# Patient Record
Sex: Female | Born: 1940
Health system: Southern US, Community
[De-identification: ages and names within clinical notes are randomized; demographics above are authoritative.]

## PROBLEM LIST (undated history)

## (undated) DIAGNOSIS — K219 Gastro-esophageal reflux disease without esophagitis: Secondary | ICD-10-CM

## (undated) DIAGNOSIS — Z800 Family history of malignant neoplasm of digestive organs: Secondary | ICD-10-CM

## (undated) DIAGNOSIS — Z4689 Encounter for fitting and adjustment of other specified devices: Secondary | ICD-10-CM

## (undated) DIAGNOSIS — I639 Cerebral infarction, unspecified: Secondary | ICD-10-CM

## (undated) DIAGNOSIS — C569 Malignant neoplasm of unspecified ovary: Secondary | ICD-10-CM

## (undated) DIAGNOSIS — M549 Dorsalgia, unspecified: Secondary | ICD-10-CM

## (undated) DIAGNOSIS — I10 Essential (primary) hypertension: Secondary | ICD-10-CM

## (undated) HISTORY — DX: Family history of malignant neoplasm of digestive organs: Z80.0

## (undated) HISTORY — DX: Encounter for fitting and adjustment of other specified devices: Z46.89

## (undated) HISTORY — DX: Cerebral infarction, unspecified: I63.9

## (undated) HISTORY — DX: Essential (primary) hypertension: I10

## (undated) HISTORY — PX: FRACTURE SURGERY: SHX138

## (undated) HISTORY — PX: ABDOMINAL HYSTERECTOMY: SHX81

## (undated) HISTORY — PX: BACK SURGERY: SHX140

## (undated) MED FILL — Dexamethasone Sodium Phosphate Inj 100 MG/10ML: INTRAMUSCULAR | Qty: 1 | Status: AC

---

## 2011-10-25 ENCOUNTER — Emergency Department (HOSPITAL_COMMUNITY)
Admission: EM | Admit: 2011-10-25 | Discharge: 2011-10-25 | Disposition: A | Discharge status: Home / Self Care | Payer: Medicare Other | Attending: Emergency Medicine | Admitting: Emergency Medicine

## 2011-10-25 ENCOUNTER — Encounter (HOSPITAL_COMMUNITY): Payer: Self-pay | Admitting: *Deleted

## 2011-10-25 DIAGNOSIS — Z7982 Long term (current) use of aspirin: Secondary | ICD-10-CM | POA: Insufficient documentation

## 2011-10-25 DIAGNOSIS — Z79899 Other long term (current) drug therapy: Secondary | ICD-10-CM | POA: Insufficient documentation

## 2011-10-25 DIAGNOSIS — M545 Low back pain, unspecified: Secondary | ICD-10-CM | POA: Insufficient documentation

## 2011-10-25 DIAGNOSIS — M549 Dorsalgia, unspecified: Secondary | ICD-10-CM

## 2011-10-25 DIAGNOSIS — K219 Gastro-esophageal reflux disease without esophagitis: Secondary | ICD-10-CM | POA: Insufficient documentation

## 2011-10-25 HISTORY — DX: Dorsalgia, unspecified: M54.9

## 2011-10-25 HISTORY — DX: Gastro-esophageal reflux disease without esophagitis: K21.9

## 2011-10-25 MED ORDER — DIAZEPAM 5 MG PO TABS: 12.0000 mg | ORAL_TABLET | Freq: Three times a day (TID) | ORAL | Status: AC | PRN

## 2011-10-25 MED ORDER — OXYCODONE-ACETAMINOPHEN 5-325 MG PO TABS: 1.0000 | ORAL_TABLET | ORAL | Status: AC | PRN

## 2011-10-25 MED ORDER — HYDROMORPHONE HCL PF 1 MG/ML IJ SOLN
1.0000 mg | Freq: Once | INTRAMUSCULAR | Status: AC
  Administered 2011-10-25: 1 mg via INTRAMUSCULAR
  Filled 2011-10-25: qty 1

## 2011-10-25 MED ORDER — DIAZEPAM 5 MG/ML IJ SOLN
5.0000 mg | Freq: Once | INTRAMUSCULAR | Status: AC
  Administered 2011-10-25: 5 mg via INTRAMUSCULAR
  Filled 2011-10-25: qty 2

## 2011-10-25 MED ORDER — NAPROXEN 500 MG PO TABS: 500.0000 mg | ORAL_TABLET | Freq: Two times a day (BID) | ORAL | Status: DC | PRN

## 2011-10-25 MED ORDER — IBUPROFEN 200 MG PO TABS
400.0000 mg | ORAL_TABLET | Freq: Once | ORAL | Status: AC
  Administered 2011-10-25: 400 mg via ORAL
  Filled 2011-10-25: qty 2

## 2011-10-25 NOTE — ED Notes (Signed)
Pt in from home with lower back pain denies recent injury hx of back surgery with ruptured disc pt states pain is shooting into the legs

## 2011-10-25 NOTE — ED Notes (Signed)
Pt from home with reports of lower back pain, worse on left that started on Monday. Pt also reports doing more yard work and work around than normal due to husband having surgery. Pt also endorses hx of chronic back pain with back surgery as well as pain injections. Pt denies pain radiation, dysuria and N/V/D.

## 2011-10-25 NOTE — ED Provider Notes (Signed)
History    70yF with lower back pain. Gradual onset Monday. Doing yard work day prior. Diffuse across the lower back. Does not lateralize. Does not radiate. Ache at rest and worse with movement. She denies numbness, tingling or loss of strength. She denies use of blood thinning medication aside from 81 mg of aspirin. Patient has a history of a back pain with previous back surgery and also injections. Denies any recent procedures. No fevers or chills. No urinary complaints. No rash. No change in sensation when she wipes after using the bathroom.  CSN: [id number]  Arrival date & time 10/25/11  4098   First MD Initiated Contact with Patient 10/25/11 1018      Chief Complaint  Patient presents with  . Back Pain    (Consider location/radiation/quality/duration/timing/severity/associated sxs/prior treatment) HPI  Past Medical History  Diagnosis Date  . GERD (gastroesophageal reflux disease)   . Back pain     Past Surgical History  Procedure Date  . Back surgery   . Abdominal hysterectomy   . Fracture surgery     History reviewed. No pertinent family history.  History  Substance Use Topics  . Smoking status: Never Smoker   . Smokeless tobacco: Never Used  . Alcohol Use: No    OB History    Grav Para Term Preterm Abortions TAB SAB Ect Mult Living                  Review of Systems   Review of symptoms negative unless otherwise noted in HPI.   Allergies  Review of patient's allergies indicates no known allergies.  Home Medications   Current Outpatient Rx  Name Route Sig Dispense Refill  . ASPIRIN EC 81 MG PO TBEC Oral Take 81 mg by mouth daily.    Marland Kitchen CALCIUM CARBONATE-VITAMIN D 500-200 MG-UNIT PO TABS Oral Take 1 tablet by mouth daily.    Marland Kitchen CETIRIZINE HCL 10 MG PO TABS Oral Take 10 mg by mouth daily.    Marland Kitchen ESTRADIOL 1 MG PO TABS Oral Take 1 mg by mouth daily.    . ADULT MULTIVITAMIN W/MINERALS CH Oral Take 1 tablet by mouth daily.    . OXYCODONE-ACETAMINOPHEN 5-325  MG PO TABS Oral Take 1 tablet by mouth every 4 (four) hours as needed. For pain    . RANITIDINE HCL 150 MG PO TABS Oral Take 150 mg by mouth daily.    . CYANOCOBALAMIN 250 MCG PO TABS Oral Take 250 mcg by mouth daily.    Marland Kitchen VITAMIN C 500 MG PO TABS Oral Take 500 mg by mouth daily.      BP 148/74  Pulse 69  Temp(Src) 98.3 F (36.8 C) (Oral)  Resp 16  Ht 5\' 7"  (1.702 m)  Wt 200 lb (90.719 kg)  BMI 31.32 kg/m2  SpO2 100%  Physical Exam  Nursing note and vitals reviewed. Constitutional: She appears well-developed and well-nourished. No distress.  HENT:  Head: Normocephalic and atraumatic.  Eyes: Conjunctivae are normal. Right eye exhibits no discharge. Left eye exhibits no discharge.  Neck: Neck supple.  Cardiovascular: Normal rate, regular rhythm and normal heart sounds.  Exam reveals no gallop and no friction rub.   No murmur heard. Pulmonary/Chest: Effort normal and breath sounds normal. No respiratory distress.  Abdominal: Soft. She exhibits no distension. There is no tenderness.  Musculoskeletal: She exhibits no edema.       Well-healed midline surgical scar in the lumbar region. Overlying skin changes. No midline spinal tenderness. Mild  tenderness bilaterally paraspinally.  Neurological: She is alert.       Strength is 5 out of 5 bilateral lower extremities. Patellar reflexes 1+. Incision is intact to light touch. Slightly antalgic gait.  Skin: Skin is warm and dry.  Psychiatric: She has a normal mood and affect. Her behavior is normal. Thought content normal.    ED Course  Procedures (including critical care time)  Labs Reviewed - No data to display No results found.   1. Back pain       MDM  Female with lower back pain. Suspect muscular sprain/strain with onset shortly after yard work. Gradual onset. There is no history of acute trauma. Pt denies hx of CA to suggest possible mets or pathologic fracture. Patient is afebrile. Denies hx of IV drug use. Doubt spinal  epidural abscess or vertebral osteomyelitis/discitis. There is no clinical evidence of spinal cord impingement. She has no acute neurological complaints and has a nonfocal neurological examination. No urinary symptoms to suggest UTI. Doubt ureteral colic. Pt does have a remote hx of back surgery, but no recent procedures.  Take 81mg , otherwise not anticoagulated. Consider spinal epidural hematoma but doubt. Doubt AAA or dissection. No overlying skin changes to suggest shingles and pain crosses midline. Pt requesting an MRI. There is no indication to have this done emergently. Plan symptomatic tx. Return precautions discussed. Outpt fu.        Raeford Razor, MD 10/27/11 [phone]

## 2011-10-25 NOTE — Discharge Instructions (Signed)
Back Pain, Adult Low back pain is very common. About 1 in 5 people have back pain.The cause of low back pain is rarely dangerous. The pain often gets better over time.About half of people with a sudden onset of back pain feel better in just 2 weeks. About 8 in 10 people feel better by 6 weeks.  CAUSES Some common causes of back pain include:  Strain of the muscles or ligaments supporting the spine.   Wear and tear (degeneration) of the spinal discs.   Arthritis.   Direct injury to the back.  DIAGNOSIS Most of the time, the direct cause of low back pain is not known.However, back pain can be treated effectively even when the exact cause of the pain is unknown.Answering your caregiver's questions about your overall health and symptoms is one of the most accurate ways to make sure the cause of your pain is not dangerous. If your caregiver needs more information, he or she may order lab work or imaging tests (X-rays or MRIs).However, even if imaging tests show changes in your back, this usually does not require surgery. HOME CARE INSTRUCTIONS For many people, back pain returns.Since low back pain is rarely dangerous, it is often a condition that people can learn to manageon their own.   Remain active. It is stressful on the back to sit or stand in one place. Do not sit, drive, or stand in one place for more than 30 minutes at a time. Take short walks on level surfaces as soon as pain allows.Try to increase the length of time you walk each day.   Do not stay in bed.Resting more than 1 or 2 days can delay your recovery.   Do not avoid exercise or work.Your body is made to move.It is not dangerous to be active, even though your back may hurt.Your back will likely heal faster if you return to being active before your pain is gone.   Pay attention to your body when you bend and lift. Many people have less discomfortwhen lifting if they bend their knees, keep the load close to their  bodies,and avoid twisting. Often, the most comfortable positions are those that put less stress on your recovering back.   Find a comfortable position to sleep. Use a firm mattress and lie on your side with your knees slightly bent. If you lie on your back, put a pillow under your knees.   Only take over-the-counter or prescription medicines as directed by your caregiver. Over-the-counter medicines to reduce pain and inflammation are often the most helpful.Your caregiver may prescribe muscle relaxant drugs.These medicines help dull your pain so you can more quickly return to your normal activities and healthy exercise.   Put ice on the injured area.   Put ice in a plastic bag.   Place a towel between your skin and the bag.   Leave the ice on for 15 to 20 minutes, 3 to 4 times a day for the first 2 to 3 days. After that, ice and heat may be alternated to reduce pain and spasms.   Ask your caregiver about trying back exercises and gentle massage. This may be of some benefit.   Avoid feeling anxious or stressed.Stress increases muscle tension and can worsen back pain.It is important to recognize when you are anxious or stressed and learn ways to manage it.Exercise is a great option.  SEEK MEDICAL CARE IF:  You have pain that is not relieved with rest or medicine.   You have   pain that does not improve in 1 week.   You have new symptoms.   You are generally not feeling well.  SEEK IMMEDIATE MEDICAL CARE IF:   You have pain that radiates from your back into your legs.   You develop new bowel or bladder control problems.   You have unusual weakness or numbness in your arms or legs.   You develop nausea or vomiting.   You develop abdominal pain.   You feel faint.  Document Released: 06/15/2005 Document Revised: 06/04/2011 Document Reviewed: 11/03/2010 [facility] Patient Information 2012 [place], [place].  RESOURCE GUIDE  Dental Problems  Patients with Medicaid: [facility] [phone] [address]                                           [phone] [employer] [address] Phone:  [phone]                                                  Phone:  [phone]  If unable to pay or uninsured, contact:  [facility] or [facility]. to become qualified for the adult dental clinic.  Chronic Pain Problems Contact [facility]  [phone] Patients need to be referred by their primary care doctor.  Insufficient Money for Medicine Contact [employer]:  call "211" or [facility] [phone].  No Primary Care Doctor Call [facility]  [phone] Other agencies that provide inexpensive medical care    [facility]  [phone]    [facility]  [phone]    [facility]  [phone]    [facility]  [phone]    [facility]  [phone]    [facility]  [phone]  Psychological [facility]  [phone] [facility]  [phone] [facility] Mental Health   [phone] (emergency services [phone])  Substance Abuse Resources [facility]  [phone] [facility] 808-019-2490 [facility] [phone] Floydene Flock [phone] Residential & Outpatient Substance Abuse Program  310-832-0651  Abuse/Neglect [facility] Child Abuse Hotline 939-733-4751 [facility] Child Abuse Hotline 216-448-2372 (After Hours)  Emergency Shelter [facility] 7694117232  Maternity [facility] 7166953731 [facility] [phone]  MRSA Hotline #:   (475)316-5257    [facility] Resources  [facility] [place]     [employer]                          [facility] Dept. [address] [place]                       [address]      [address]  [address]  Cristobal Goldmann Phone:  [phone]                                   Phone:  [phone]                 Phone:  [phone]  [facility] Mental [facility] Phone:  [phone]  [facility] Child Abuse Hotline (336)857-2547 480-714-8526 (After Hours)  Back Exercises Back exercises help treat and prevent back injuries. The goal of back exercises is to increase the strength of your abdominal and back muscles and the flexibility of your back. These exercises should be started when you no longer have back pain. Back exercises include:  Pelvic Tilt. Lie on your back with your knees bent. Tilt your pelvis until the lower part of your back is against the floor. Hold this position 5 to 10 sec and repeat 5 to 10 times.   Knee to Chest. Pull first 1 knee up against your chest and hold for 20 to 30 seconds, repeat this with the other knee, and then both knees. This may be done with the other leg straight or bent, whichever feels better.   Sit-Ups or Curl-Ups. Bend your knees 90 degrees. Start with tilting your pelvis, and do a partial, slow sit-up, lifting your trunk only 30 to 45 degrees off the floor. Take at least 2 to 3 seconds for each sit-up. Do not do sit-ups with your knees out straight. If partial sit-ups are difficult, simply do the above but with only tightening your abdominal muscles and holding it as directed.   Hip-Lift. Lie on your back with your knees flexed 90 degrees. Push down with your feet and shoulders as you raise your hips a couple inches off the floor; hold for 10 seconds, repeat 5 to 10 times.   Back arches. Lie on your stomach, propping yourself up on bent elbows. Slowly press on your hands, causing an arch in your low back. Repeat 3 to 5 times. Any initial stiffness and discomfort should lessen with repetition over time.   Shoulder-Lifts. Lie face down with arms beside your body. Keep hips and torso pressed to floor as you slowly lift your  head and shoulders off the floor.  Do not overdo your exercises, especially in the beginning. Exercises may cause you some mild back discomfort which lasts for a few minutes; however, if the pain is more severe, or lasts for more than 15 minutes, do not continue exercises until you see your caregiver. Improvement with exercise therapy for back problems is slow.  See your caregivers for assistance with developing a proper back exercise program. Document Released: 07/23/2004 Document Revised: 06/04/2011 Document Reviewed: 06/15/2005 [facility] Patient Information 2012 [place], [place].

## 2012-05-28 ENCOUNTER — Encounter (HOSPITAL_COMMUNITY): Payer: Self-pay | Admitting: *Deleted

## 2012-05-28 ENCOUNTER — Emergency Department (HOSPITAL_COMMUNITY)
Admission: EM | Admit: 2012-05-28 | Discharge: 2012-05-28 | Disposition: A | Discharge status: Home / Self Care | Payer: Medicare Other | Attending: Emergency Medicine | Admitting: Emergency Medicine

## 2012-05-28 DIAGNOSIS — Z7982 Long term (current) use of aspirin: Secondary | ICD-10-CM | POA: Insufficient documentation

## 2012-05-28 DIAGNOSIS — R110 Nausea: Secondary | ICD-10-CM | POA: Insufficient documentation

## 2012-05-28 DIAGNOSIS — F411 Generalized anxiety disorder: Secondary | ICD-10-CM | POA: Insufficient documentation

## 2012-05-28 DIAGNOSIS — G43909 Migraine, unspecified, not intractable, without status migrainosus: Secondary | ICD-10-CM | POA: Insufficient documentation

## 2012-05-28 DIAGNOSIS — R51 Headache: Secondary | ICD-10-CM

## 2012-05-28 DIAGNOSIS — K219 Gastro-esophageal reflux disease without esophagitis: Secondary | ICD-10-CM | POA: Insufficient documentation

## 2012-05-28 DIAGNOSIS — Z79899 Other long term (current) drug therapy: Secondary | ICD-10-CM | POA: Insufficient documentation

## 2012-05-28 DIAGNOSIS — H53149 Visual discomfort, unspecified: Secondary | ICD-10-CM | POA: Insufficient documentation

## 2012-05-28 MED ORDER — DIPHENHYDRAMINE HCL 50 MG/ML IJ SOLN
25.0000 mg | Freq: Once | INTRAMUSCULAR | Status: AC
  Administered 2012-05-28: 25 mg via INTRAMUSCULAR
  Filled 2012-05-28: qty 1

## 2012-05-28 MED ORDER — HYDROCODONE-ACETAMINOPHEN 5-500 MG PO TABS: 1.0000 | ORAL_TABLET | Freq: Four times a day (QID) | ORAL | Status: DC | PRN

## 2012-05-28 MED ORDER — METOCLOPRAMIDE HCL 5 MG/ML IJ SOLN
10.0000 mg | Freq: Once | INTRAMUSCULAR | Status: AC
  Administered 2012-05-28: 10 mg via INTRAMUSCULAR
  Filled 2012-05-28: qty 2

## 2012-05-28 MED ORDER — KETOROLAC TROMETHAMINE 60 MG/2ML IM SOLN
60.0000 mg | Freq: Once | INTRAMUSCULAR | Status: AC
  Administered 2012-05-28: 60 mg via INTRAMUSCULAR
  Filled 2012-05-28: qty 2

## 2012-05-28 NOTE — ED Provider Notes (Signed)
History     CSN: [id number]  Arrival date & time 05/28/12  1512   First MD Initiated Contact with Patient 05/28/12 1946      Chief Complaint  Patient presents with  . Headache    (Consider location/radiation/quality/duration/timing/severity/associated sxs/prior treatment) Patient is a 71 y.o. female presenting with headaches. The history is provided by the patient, the spouse and medical records.  Headache  Pertinent negatives include no fever, no shortness of breath, no nausea and no vomiting.   Paula Peterson is a 71 y.o. female presents to the emergency department complaining of a headache gradual onset approximately 10:30 AM this morning, it has been persistent and gradually worsening. She states she's been seen several times in the last 2 weeks the emergency department in [facility] and by her family practice physician for these headaches. 2 weeks ago in the emergency department and he and she was given a CT scan, cardiac workup blood work and EKG all of which were normal. She was diagnosed by her primary care doctor is having migraines. He suggested that she begin taking over-the-counter medications for her migraines and prescribed a Xanax prescription for her anxiety. She states the headache is associated with photophobia and occasionally with nausea but not with vomiting. She states the pain was not sudden onset without changes in her vision.  She denies weakness, dizziness,  Numbness, tingling,  Confusion, your, chills, neck pain, chest pain, shortness of breath, abdominal pain, vomiting, diarrhea, constipation, syncope.  Her family practice physician has made her an appointment with [facility] neurology and she is to see them on Wednesday. She states when she was in the emergency department less than 1 week ago they gave her a shot which made her feel better.  Past Medical History  Diagnosis Date  . GERD (gastroesophageal reflux disease)   . Back pain     Past Surgical History  Procedure  Date  . Back surgery   . Abdominal hysterectomy   . Fracture surgery     History reviewed. No pertinent family history.  History  Substance Use Topics  . Smoking status: Never Smoker   . Smokeless tobacco: Never Used  . Alcohol Use: No    OB History    Grav Para Term Preterm Abortions TAB SAB Ect Mult Living                  Review of Systems  Constitutional: Negative for fever, diaphoresis, appetite change, fatigue and unexpected weight change.  HENT: Negative for mouth sores and neck stiffness.   Eyes: Positive for photophobia. Negative for visual disturbance.  Respiratory: Negative for cough, chest tightness, shortness of breath and wheezing.   Cardiovascular: Negative for chest pain.  Gastrointestinal: Negative for nausea, vomiting, abdominal pain, diarrhea and constipation.  Genitourinary: Negative for dysuria, urgency, frequency and hematuria.  Skin: Negative for rash.  Neurological: Positive for headaches. Negative for syncope and light-headedness.  Psychiatric/Behavioral: Negative for sleep disturbance. The patient is not nervous/anxious.   All other systems reviewed and are negative.    Allergies  Review of patient's allergies indicates no known allergies.  Home Medications   Current Outpatient Rx  Name  Route  Sig  Dispense  Refill  . ALPRAZOLAM 0.25 MG PO TABS   Oral   Take 0.25 mg by mouth 2 (two) times daily as needed. For anxiety         . ASPIRIN EC 81 MG PO TBEC   Oral   Take 81 mg by  mouth daily.         Marland Kitchen CALCIUM CARBONATE-VITAMIN D 500-200 MG-UNIT PO TABS   Oral   Take 1 tablet by mouth daily.         Marland Kitchen CETIRIZINE HCL 10 MG PO TABS   Oral   Take 10 mg by mouth daily.         Marland Kitchen ESTRADIOL 1 MG PO TABS   Oral   Take 1 mg by mouth daily.         . IBUPROFEN 200 MG PO CAPS   Oral   Take 200 mg by mouth daily as needed. For migraines         . ADULT MULTIVITAMIN W/MINERALS CH   Oral   Take 1 tablet by mouth daily.          Marland Kitchen NAPROXEN 500 MG PO TABS   Oral   Take 500 mg by mouth 2 (two) times daily as needed. For pain         . NORTRIPTYLINE HCL 25 MG PO CAPS   Oral   Take 50 mg by mouth at bedtime.         . OMEPRAZOLE 20 MG PO CPDR   Oral   Take 20 mg by mouth daily.         Marland Kitchen RANITIDINE HCL 150 MG PO TABS   Oral   Take 150 mg by mouth daily.         . CYANOCOBALAMIN 250 MCG PO TABS   Oral   Take 250 mcg by mouth daily.         Marland Kitchen VITAMIN C 500 MG PO TABS   Oral   Take 500 mg by mouth daily.         Marland Kitchen HYDROCODONE-ACETAMINOPHEN 5-500 MG PO TABS   Oral   Take 1 tablet by mouth every 6 (six) hours as needed for pain.   5 tablet   0     BP 158/78  Pulse 72  Temp 97.8 F (36.6 C) (Oral)  Resp 19  SpO2 100%  Physical Exam  Nursing note and vitals reviewed. Constitutional: She appears well-developed and well-nourished. No distress.  HENT:  Head: Normocephalic and atraumatic.  Right Ear: Tympanic membrane, external ear and ear canal normal.  Left Ear: Tympanic membrane, external ear and ear canal normal.  Nose: Nose normal. Right sinus exhibits no maxillary sinus tenderness and no frontal sinus tenderness. Left sinus exhibits no maxillary sinus tenderness and no frontal sinus tenderness.  Mouth/Throat: Uvula is midline, oropharynx is clear and moist and mucous membranes are normal. No oropharyngeal exudate.  Eyes: Conjunctivae normal and EOM are normal. Pupils are equal, round, and reactive to light. No scleral icterus.  Neck: Normal range of motion. Neck supple.  Cardiovascular: Normal rate, regular rhythm, normal heart sounds and intact distal pulses.  Exam reveals no gallop and no friction rub.   No murmur heard. Pulmonary/Chest: Effort normal and breath sounds normal. No respiratory distress. She has no wheezes.  Abdominal: Soft. Bowel sounds are normal. She exhibits no mass. There is no tenderness. There is no rebound and no guarding.  Musculoskeletal: Normal range of  motion. She exhibits no edema.  Lymphadenopathy:    She has no cervical adenopathy.  Neurological: She is alert. No cranial nerve deficit. She exhibits normal muscle tone. Coordination normal.       Speech is clear and goal oriented, follows commands Major Cranial nerves without deficit, no facial droop Normal strength in  upper and lower extremities bilaterally including dorsiflexion and plantar flexion, strong and equal grip strength Sensation normal to light and sharp touch Moves extremities without ataxia, coordination intact Normal finger to nose and rapid alternating movements Neg modified romberg, no pronator drift Normal gait and balance   Skin: Skin is warm and dry. No rash noted. She is not diaphoretic. No erythema.  Psychiatric: She has a normal mood and affect.    ED Course  Procedures (including critical care time)  Labs Reviewed - No data to display No results found.   1. Migraine   2. Headache       MDM  Taegan Haider Pichon presents with headache.  Pt HA treated with Toradol, Benadryl and Reglanand improved while in ED.  Presentation is like the headaches that she has had in the last few weeks and non concerning for [facility], ICH, Meningitis, or temporal arteritis. Pt is afebrile with no focal neuro deficits, nuchal rigidity, or change in vision. Pt is to follow up with per neurology next week as currently scheduled discuss prophylactic medication. Pt verbalizes understanding and is agreeable with plan to dc.    1. Medications: [place], usual home medications  2. Treatment: rest, drink plenty of fluids, take medication as prescribed  3. Follow Up: Please followup with your primary doctor for discussion of your diagnoses and further evaluation after today's visit; if you do not have a primary care doctor use the resource guide provided to find one; follow-up with [facility] this week as scheduled       Dierdre Forth, PA-C 05/28/12 2133

## 2012-05-28 NOTE — ED Notes (Signed)
Pt reports having severe headache x 2 weeks. Has been seen at hospital in [place] and dx with migraine, no relief from pain. Denies any n/v. No acute distress noted at triage.

## 2012-05-29 NOTE — ED Provider Notes (Signed)
Medical screening examination/treatment/procedure(s) were performed by non-physician practitioner and as supervising physician I was immediately available for consultation/collaboration.   Carleene Cooper III, MD 05/29/12 1254

## 2013-04-08 ENCOUNTER — Emergency Department (HOSPITAL_COMMUNITY)
Admission: EM | Admit: 2013-04-08 | Discharge: 2013-04-08 | Disposition: A | Discharge status: Home / Self Care | Payer: Medicare Other | Attending: Emergency Medicine | Admitting: Emergency Medicine

## 2013-04-08 ENCOUNTER — Emergency Department (HOSPITAL_COMMUNITY): Payer: Medicare Other

## 2013-04-08 ENCOUNTER — Encounter (HOSPITAL_COMMUNITY): Payer: Self-pay | Admitting: Emergency Medicine

## 2013-04-08 DIAGNOSIS — K219 Gastro-esophageal reflux disease without esophagitis: Secondary | ICD-10-CM | POA: Insufficient documentation

## 2013-04-08 DIAGNOSIS — J320 Chronic maxillary sinusitis: Secondary | ICD-10-CM | POA: Insufficient documentation

## 2013-04-08 DIAGNOSIS — Z7982 Long term (current) use of aspirin: Secondary | ICD-10-CM | POA: Insufficient documentation

## 2013-04-08 DIAGNOSIS — Z79899 Other long term (current) drug therapy: Secondary | ICD-10-CM | POA: Insufficient documentation

## 2013-04-08 DIAGNOSIS — R202 Paresthesia of skin: Secondary | ICD-10-CM

## 2013-04-08 DIAGNOSIS — R209 Unspecified disturbances of skin sensation: Secondary | ICD-10-CM | POA: Insufficient documentation

## 2013-04-08 DIAGNOSIS — I10 Essential (primary) hypertension: Secondary | ICD-10-CM | POA: Insufficient documentation

## 2013-04-08 DIAGNOSIS — J0100 Acute maxillary sinusitis, unspecified: Secondary | ICD-10-CM

## 2013-04-08 LAB — CBC
Hemoglobin: 13.7 g/dL (ref 12.0–15.0)
MCH: 30.9 pg (ref 26.0–34.0)
MCV: 91.2 fL (ref 78.0–100.0)
Platelets: 285 10*3/uL (ref 150–400)
RBC: 4.43 MIL/uL (ref 3.87–5.11)
WBC: 6.1 10*3/uL (ref 4.0–10.5)

## 2013-04-08 LAB — COMPREHENSIVE METABOLIC PANEL
ALT: 13 U/L (ref 0–35)
AST: 22 U/L (ref 0–37)
Albumin: 3.4 g/dL — ABNORMAL LOW (ref 3.5–5.2)
BUN: 13 mg/dL (ref 6–23)
CO2: 25 mEq/L (ref 19–32)
Calcium: 9.5 mg/dL (ref 8.4–10.5)
Chloride: 100 mEq/L (ref 96–112)
Creatinine, Ser: 0.8 mg/dL (ref 0.50–1.10)
GFR calc Af Amer: 85 mL/min — ABNORMAL LOW (ref >90)
Glucose, Bld: 88 mg/dL (ref 70–99)
Sodium: 136 mEq/L (ref 135–145)
Total Protein: 7 g/dL (ref 6.0–8.3)

## 2013-04-08 LAB — DIFFERENTIAL
Basophils Absolute: 0 10*3/uL (ref 0.0–0.1)
Basophils Relative: 1 % (ref 0–1)
Eosinophils Relative: 2 % (ref 0–5)
Lymphocytes Relative: 49 % — ABNORMAL HIGH (ref 12–46)
Lymphs Abs: 3 10*3/uL (ref 0.7–4.0)
Neutro Abs: 2.4 10*3/uL (ref 1.7–7.7)

## 2013-04-08 LAB — RAPID URINE DRUG SCREEN, HOSP PERFORMED
Amphetamines: NOT DETECTED
Barbiturates: NOT DETECTED
Benzodiazepines: NOT DETECTED
Cocaine: NOT DETECTED
Tetrahydrocannabinol: NOT DETECTED

## 2013-04-08 LAB — URINALYSIS, ROUTINE W REFLEX MICROSCOPIC
Bilirubin Urine: NEGATIVE
Ketones, ur: NEGATIVE mg/dL
Leukocytes, UA: NEGATIVE
Nitrite: NEGATIVE
Protein, ur: NEGATIVE mg/dL
Urobilinogen, UA: 0.2 mg/dL (ref 0.0–1.0)

## 2013-04-08 LAB — PROTIME-INR: Prothrombin Time: 12.5 seconds (ref 11.6–15.2)

## 2013-04-08 LAB — TROPONIN I: Troponin I: <0.3 ng/mL (ref <0.30)

## 2013-04-08 NOTE — ED Notes (Signed)
Pt. Was seen at [facility] with c/o face tingling and left hand tingling.  Pt. Was noted HTN at the [facility].  Pt. reports no other complaints at this time.  Pt. reports that the tingling in face has gone away and only has tingling in the hand.

## 2013-04-08 NOTE — ED Provider Notes (Signed)
CSN: [id number]     Arrival date & time 04/08/13  1851 History   First MD Initiated Contact with Patient 04/08/13 1854     Chief Complaint  Patient presents with  . Hypertension  . Tingling   (Consider location/radiation/quality/duration/timing/severity/associated sxs/prior Treatment) HPI Comments: Paula Peterson is a 72 y.o. female who complains of tingling in her about 4:30 PM today. The facial tingling has resolved. She did not feel weak or have difficulty walking or using her left hand. She's had intermittent headaches for one month. They occur spontaneously, and resolve spontaneously. No recent fever, chills, nausea, vomiting, cough, shortness of breath, chest pain, back, pain or gait disorder. She was seen briefly in urgent care and advised to come to the emergency room. She was sent by EMS. She was not treated during that transport. There are no other known modifying factors.   Patient is a 72 y.o. female presenting with hypertension. The history is provided by the patient and the spouse.  Hypertension    Past Medical History  Diagnosis Date  . GERD (gastroesophageal reflux disease)   . Back pain    Past Surgical History  Procedure Laterality Date  . Back surgery    . Abdominal hysterectomy    . Fracture surgery     History reviewed. No pertinent family history. History  Substance Use Topics  . Smoking status: Never Smoker   . Smokeless tobacco: Never Used  . Alcohol Use: No   OB History   Grav Para Term Preterm Abortions TAB SAB Ect Mult Living                 Review of Systems  All other systems reviewed and are negative.    Allergies  Review of patient's allergies indicates no known allergies.  Home Medications   Current Outpatient Rx  Name  Route  Sig  Dispense  Refill  . aspirin EC 81 MG tablet   Oral   Take 81 mg by mouth daily.         . calcium-vitamin D (OSCAL WITH D) 500-200 MG-UNIT per tablet   Oral   Take 1 tablet by mouth daily.          . cetirizine (ZYRTEC) 10 MG tablet   Oral   Take 10 mg by mouth daily.         Marland Kitchen estradiol (ESTRACE) 1 MG tablet   Oral   Take 1 mg by mouth daily.         Marland Kitchen MAGNESIUM PO   Oral   Take 1 tablet by mouth daily.         . Multiple Vitamin (MULITIVITAMIN WITH MINERALS) TABS   Oral   Take 1 tablet by mouth daily.         Marland Kitchen omeprazole (PRILOSEC) 20 MG capsule   Oral   Take 20 mg by mouth daily.         . vitamin B-12 (CYANOCOBALAMIN) 250 MCG tablet   Oral   Take 250 mcg by mouth daily.         . vitamin C (ASCORBIC ACID) 500 MG tablet   Oral   Take 500 mg by mouth daily.          BP 128/69  Pulse 68  Temp(Src) 98.4 F (36.9 C) (Oral)  Resp 20  SpO2 97% Physical Exam  Nursing note and vitals reviewed. Constitutional: She is oriented to person, place, and time. She appears well-developed and well-nourished.  HENT:  Head: Normocephalic and atraumatic.  Eyes: Conjunctivae and EOM are normal. Pupils are equal, round, and reactive to light.  Neck: Normal range of motion and phonation normal. Neck supple.  Cardiovascular: Normal rate, regular rhythm and intact distal pulses.   Pulmonary/Chest: Effort normal and breath sounds normal. She exhibits no tenderness.  Abdominal: Soft. She exhibits no distension. There is no tenderness. There is no guarding.  Musculoskeletal: Normal range of motion.  Neurological: She is alert and oriented to person, place, and time. She exhibits normal muscle tone.  No aphasia, no dysarthria, no ataxia. No dysmetria.  Skin: Skin is warm and dry.  Psychiatric: She has a normal mood and affect. Her behavior is normal. Judgment and thought content normal.    ED Course  Procedures (including critical care time)  Medications - No data to display  Patient Vitals for the past 24 hrs:  BP Temp Temp src Pulse Resp SpO2  04/08/13 2330 128/69 mmHg - - 68 - 97 %  04/08/13 2315 117/91 mmHg - - 68 - 98 %  04/08/13 2234 150/69 mmHg - - 69  20 98 %  04/08/13 2136 122/74 mmHg - - - 13 98 %  04/08/13 2115 164/74 mmHg - - 72 20 98 %  04/08/13 2045 168/91 mmHg - - 69 15 99 %  04/08/13 2015 156/74 mmHg - - 68 16 100 %  04/08/13 1903 174/95 mmHg 98.4 F (36.9 C) Oral 70 20 100 %  04/08/13 1854 - 98.3 F (36.8 C) - - - -    20:10- paresthesias with improvement spontaneously. She has not meet criteria for treatment as a code stroke.    Labs Review Labs Reviewed  DIFFERENTIAL - Abnormal; Notable for the following:    Neutrophils Relative % 39 (*)    Lymphocytes Relative 49 (*)    All other components within normal limits  COMPREHENSIVE METABOLIC PANEL - Abnormal; Notable for the following:    Albumin 3.4 (*)    Total Bilirubin 0.2 (*)    GFR calc non Af Amer 74 (*)    GFR calc Af Amer 85 (*)    All other components within normal limits  ETHANOL  PROTIME-INR  APTT  CBC  URINE RAPID DRUG SCREEN (HOSP PERFORMED)  URINALYSIS, ROUTINE W REFLEX MICROSCOPIC  TROPONIN I  GLUCOSE, CAPILLARY   Imaging Review Ct Head Wo Contrast  04/08/2013   CLINICAL DATA:  Left arm and left mouth tingling sensations.  EXAM: CT HEAD WITHOUT CONTRAST  TECHNIQUE: Contiguous axial images were obtained from the base of the skull through the vertex without intravenous contrast.  COMPARISON:  Brain MR dated 12/02/2011 and paranasal sinus CT dated 01/21/2010.  FINDINGS: Mildly enlarged subarachnoid spaces. Normal size and position of the ventricles. No intracranial hemorrhage, mass lesion or CT evidence of acute infarction. Right maxillary sinus air-fluid level. Unremarkable bones.  IMPRESSION: 1. No acute intracranial abnormality. 2. Mild diffuse cortical atrophy. 3. Acute right maxillary sinusitis.   Electronically Signed   By: Gordan Payment M.D.   On: 04/08/2013 21:57    EKG Interpretation   None        Date: 04/08/13  Rate: 70  Rhythm: normal sinus rhythm  QRS Axis: normal  PR and QT Intervals: PR prolonged  ST/T Wave abnormalities:  normal  PR and QRS Conduction Disutrbances:none  Narrative Interpretation:   Old EKG Reviewed: none available   MDM   1. Paresthesia of left arm   2. Maxillary sinusitis, acute  Nonspecific paresthesias. Doubt TIA, CVA, metabolic instability, or cervical myelopathy. She has improved spontaneously, in stable for discharge. She has incidental finding of maxillary sinusitis that may be allergy related. She has a prescription for Nasonex that she can start taking, at home.  Nursing Notes Reviewed/ Care Coordinated, and agree without changes. Applicable Imaging Reviewed.  Interpretation of Laboratory Data incorporated into ED treatment   Plan: Home Medications- usual; Home Treatments and Observation- rest, return for worsening symptoms; return here if the recommended treatment, does not improve the symptoms; Recommended follow up- PCP, for check up in 3 days.    Flint Melter, MD 04/09/13 [phone]

## 2014-07-05 DIAGNOSIS — M961 Postlaminectomy syndrome, not elsewhere classified: Secondary | ICD-10-CM | POA: Diagnosis not present

## 2014-07-05 DIAGNOSIS — M4807 Spinal stenosis, lumbosacral region: Secondary | ICD-10-CM | POA: Diagnosis not present

## 2014-07-05 DIAGNOSIS — M5136 Other intervertebral disc degeneration, lumbar region: Secondary | ICD-10-CM | POA: Diagnosis not present

## 2014-08-01 DIAGNOSIS — M4807 Spinal stenosis, lumbosacral region: Secondary | ICD-10-CM | POA: Diagnosis not present

## 2014-08-13 DIAGNOSIS — R05 Cough: Secondary | ICD-10-CM | POA: Diagnosis not present

## 2014-08-13 DIAGNOSIS — J019 Acute sinusitis, unspecified: Secondary | ICD-10-CM | POA: Diagnosis not present

## 2014-08-16 DIAGNOSIS — M4807 Spinal stenosis, lumbosacral region: Secondary | ICD-10-CM | POA: Diagnosis not present

## 2014-08-16 DIAGNOSIS — M5136 Other intervertebral disc degeneration, lumbar region: Secondary | ICD-10-CM | POA: Diagnosis not present

## 2014-08-24 DIAGNOSIS — M5032 Other cervical disc degeneration, mid-cervical region: Secondary | ICD-10-CM | POA: Diagnosis not present

## 2014-08-24 DIAGNOSIS — M25512 Pain in left shoulder: Secondary | ICD-10-CM | POA: Diagnosis not present

## 2014-08-24 DIAGNOSIS — M5136 Other intervertebral disc degeneration, lumbar region: Secondary | ICD-10-CM | POA: Diagnosis not present

## 2014-08-24 DIAGNOSIS — M4807 Spinal stenosis, lumbosacral region: Secondary | ICD-10-CM | POA: Diagnosis not present

## 2014-08-29 DIAGNOSIS — I10 Essential (primary) hypertension: Secondary | ICD-10-CM | POA: Diagnosis not present

## 2014-08-29 DIAGNOSIS — K210 Gastro-esophageal reflux disease with esophagitis: Secondary | ICD-10-CM | POA: Diagnosis not present

## 2014-08-29 DIAGNOSIS — E782 Mixed hyperlipidemia: Secondary | ICD-10-CM | POA: Diagnosis not present

## 2014-09-05 DIAGNOSIS — Z0001 Encounter for general adult medical examination with abnormal findings: Secondary | ICD-10-CM | POA: Diagnosis not present

## 2014-09-05 DIAGNOSIS — I351 Nonrheumatic aortic (valve) insufficiency: Secondary | ICD-10-CM | POA: Diagnosis not present

## 2014-09-05 DIAGNOSIS — Z1389 Encounter for screening for other disorder: Secondary | ICD-10-CM | POA: Diagnosis not present

## 2014-09-12 DIAGNOSIS — Z1231 Encounter for screening mammogram for malignant neoplasm of breast: Secondary | ICD-10-CM | POA: Diagnosis not present

## 2014-09-12 DIAGNOSIS — N816 Rectocele: Secondary | ICD-10-CM | POA: Diagnosis not present

## 2014-09-18 DIAGNOSIS — M5032 Other cervical disc degeneration, mid-cervical region: Secondary | ICD-10-CM | POA: Diagnosis not present

## 2014-09-27 DIAGNOSIS — R898 Other abnormal findings in specimens from other organs, systems and tissues: Secondary | ICD-10-CM | POA: Diagnosis not present

## 2014-09-27 DIAGNOSIS — L988 Other specified disorders of the skin and subcutaneous tissue: Secondary | ICD-10-CM | POA: Diagnosis not present

## 2014-09-27 DIAGNOSIS — D485 Neoplasm of uncertain behavior of skin: Secondary | ICD-10-CM | POA: Diagnosis not present

## 2014-10-01 DIAGNOSIS — B079 Viral wart, unspecified: Secondary | ICD-10-CM | POA: Diagnosis not present

## 2014-11-08 DIAGNOSIS — H04123 Dry eye syndrome of bilateral lacrimal glands: Secondary | ICD-10-CM | POA: Diagnosis not present

## 2014-11-08 DIAGNOSIS — H02403 Unspecified ptosis of bilateral eyelids: Secondary | ICD-10-CM | POA: Diagnosis not present

## 2014-11-08 DIAGNOSIS — H25813 Combined forms of age-related cataract, bilateral: Secondary | ICD-10-CM | POA: Diagnosis not present

## 2014-11-08 DIAGNOSIS — H524 Presbyopia: Secondary | ICD-10-CM | POA: Diagnosis not present

## 2014-11-08 DIAGNOSIS — R21 Rash and other nonspecific skin eruption: Secondary | ICD-10-CM | POA: Diagnosis not present

## 2014-11-27 DIAGNOSIS — H25042 Posterior subcapsular polar age-related cataract, left eye: Secondary | ICD-10-CM | POA: Diagnosis not present

## 2014-11-27 DIAGNOSIS — H2512 Age-related nuclear cataract, left eye: Secondary | ICD-10-CM | POA: Diagnosis not present

## 2014-11-27 DIAGNOSIS — H25012 Cortical age-related cataract, left eye: Secondary | ICD-10-CM | POA: Diagnosis not present

## 2014-11-27 DIAGNOSIS — H25812 Combined forms of age-related cataract, left eye: Secondary | ICD-10-CM | POA: Diagnosis not present

## 2014-12-04 DIAGNOSIS — N816 Rectocele: Secondary | ICD-10-CM | POA: Diagnosis not present

## 2014-12-04 DIAGNOSIS — N3941 Urge incontinence: Secondary | ICD-10-CM | POA: Diagnosis not present

## 2014-12-25 DIAGNOSIS — H25011 Cortical age-related cataract, right eye: Secondary | ICD-10-CM | POA: Diagnosis not present

## 2014-12-25 DIAGNOSIS — H25041 Posterior subcapsular polar age-related cataract, right eye: Secondary | ICD-10-CM | POA: Diagnosis not present

## 2014-12-25 DIAGNOSIS — H2511 Age-related nuclear cataract, right eye: Secondary | ICD-10-CM | POA: Diagnosis not present

## 2014-12-25 DIAGNOSIS — H25811 Combined forms of age-related cataract, right eye: Secondary | ICD-10-CM | POA: Diagnosis not present

## 2015-01-10 DIAGNOSIS — Z961 Presence of intraocular lens: Secondary | ICD-10-CM | POA: Diagnosis not present

## 2015-01-14 DIAGNOSIS — E782 Mixed hyperlipidemia: Secondary | ICD-10-CM | POA: Diagnosis not present

## 2015-01-14 DIAGNOSIS — I10 Essential (primary) hypertension: Secondary | ICD-10-CM | POA: Diagnosis not present

## 2015-01-24 DIAGNOSIS — J301 Allergic rhinitis due to pollen: Secondary | ICD-10-CM | POA: Diagnosis not present

## 2015-01-24 DIAGNOSIS — K219 Gastro-esophageal reflux disease without esophagitis: Secondary | ICD-10-CM | POA: Diagnosis not present

## 2015-01-24 DIAGNOSIS — I10 Essential (primary) hypertension: Secondary | ICD-10-CM | POA: Diagnosis not present

## 2015-01-24 DIAGNOSIS — E782 Mixed hyperlipidemia: Secondary | ICD-10-CM | POA: Diagnosis not present

## 2015-01-24 DIAGNOSIS — F411 Generalized anxiety disorder: Secondary | ICD-10-CM | POA: Diagnosis not present

## 2015-01-24 DIAGNOSIS — M7061 Trochanteric bursitis, right hip: Secondary | ICD-10-CM | POA: Diagnosis not present

## 2015-02-07 DIAGNOSIS — S41102A Unspecified open wound of left upper arm, initial encounter: Secondary | ICD-10-CM | POA: Diagnosis not present

## 2015-02-07 DIAGNOSIS — J019 Acute sinusitis, unspecified: Secondary | ICD-10-CM | POA: Diagnosis not present

## 2015-02-15 DIAGNOSIS — R102 Pelvic and perineal pain: Secondary | ICD-10-CM | POA: Diagnosis not present

## 2015-02-15 DIAGNOSIS — N816 Rectocele: Secondary | ICD-10-CM | POA: Diagnosis not present

## 2015-04-05 DIAGNOSIS — Z23 Encounter for immunization: Secondary | ICD-10-CM | POA: Diagnosis not present

## 2015-05-06 DIAGNOSIS — Q828 Other specified congenital malformations of skin: Secondary | ICD-10-CM | POA: Diagnosis not present

## 2015-05-06 DIAGNOSIS — L84 Corns and callosities: Secondary | ICD-10-CM | POA: Diagnosis not present

## 2015-05-06 DIAGNOSIS — L603 Nail dystrophy: Secondary | ICD-10-CM | POA: Diagnosis not present

## 2015-05-16 DIAGNOSIS — N816 Rectocele: Secondary | ICD-10-CM | POA: Diagnosis not present

## 2015-07-16 DIAGNOSIS — R05 Cough: Secondary | ICD-10-CM | POA: Diagnosis not present

## 2015-07-16 DIAGNOSIS — H6983 Other specified disorders of Eustachian tube, bilateral: Secondary | ICD-10-CM | POA: Diagnosis not present

## 2015-07-16 DIAGNOSIS — R0981 Nasal congestion: Secondary | ICD-10-CM | POA: Diagnosis not present

## 2015-09-17 DIAGNOSIS — R05 Cough: Secondary | ICD-10-CM | POA: Diagnosis not present

## 2015-09-23 DIAGNOSIS — R05 Cough: Secondary | ICD-10-CM | POA: Diagnosis not present

## 2015-09-23 DIAGNOSIS — J209 Acute bronchitis, unspecified: Secondary | ICD-10-CM | POA: Diagnosis not present

## 2015-10-03 DIAGNOSIS — Z1231 Encounter for screening mammogram for malignant neoplasm of breast: Secondary | ICD-10-CM | POA: Diagnosis not present

## 2015-10-03 DIAGNOSIS — Z124 Encounter for screening for malignant neoplasm of cervix: Secondary | ICD-10-CM | POA: Diagnosis not present

## 2015-10-10 DIAGNOSIS — K219 Gastro-esophageal reflux disease without esophagitis: Secondary | ICD-10-CM | POA: Diagnosis not present

## 2015-10-10 DIAGNOSIS — E782 Mixed hyperlipidemia: Secondary | ICD-10-CM | POA: Diagnosis not present

## 2015-10-10 DIAGNOSIS — I10 Essential (primary) hypertension: Secondary | ICD-10-CM | POA: Diagnosis not present

## 2015-10-17 DIAGNOSIS — Z1212 Encounter for screening for malignant neoplasm of rectum: Secondary | ICD-10-CM | POA: Diagnosis not present

## 2015-10-17 DIAGNOSIS — J301 Allergic rhinitis due to pollen: Secondary | ICD-10-CM | POA: Diagnosis not present

## 2015-10-17 DIAGNOSIS — Z0001 Encounter for general adult medical examination with abnormal findings: Secondary | ICD-10-CM | POA: Diagnosis not present

## 2016-01-14 DIAGNOSIS — S51801A Unspecified open wound of right forearm, initial encounter: Secondary | ICD-10-CM | POA: Diagnosis not present

## 2016-02-06 DIAGNOSIS — N816 Rectocele: Secondary | ICD-10-CM | POA: Diagnosis not present

## 2016-02-10 DIAGNOSIS — Z961 Presence of intraocular lens: Secondary | ICD-10-CM | POA: Diagnosis not present

## 2016-02-10 DIAGNOSIS — H04123 Dry eye syndrome of bilateral lacrimal glands: Secondary | ICD-10-CM | POA: Diagnosis not present

## 2016-02-10 DIAGNOSIS — Z0100 Encounter for examination of eyes and vision without abnormal findings: Secondary | ICD-10-CM | POA: Diagnosis not present

## 2016-02-10 DIAGNOSIS — H26493 Other secondary cataract, bilateral: Secondary | ICD-10-CM | POA: Diagnosis not present

## 2016-04-15 DIAGNOSIS — I10 Essential (primary) hypertension: Secondary | ICD-10-CM | POA: Diagnosis not present

## 2016-04-15 DIAGNOSIS — I351 Nonrheumatic aortic (valve) insufficiency: Secondary | ICD-10-CM | POA: Diagnosis not present

## 2016-04-15 DIAGNOSIS — E782 Mixed hyperlipidemia: Secondary | ICD-10-CM | POA: Diagnosis not present

## 2016-04-15 DIAGNOSIS — K210 Gastro-esophageal reflux disease with esophagitis: Secondary | ICD-10-CM | POA: Diagnosis not present

## 2016-04-18 DIAGNOSIS — M4807 Spinal stenosis, lumbosacral region: Secondary | ICD-10-CM | POA: Diagnosis not present

## 2016-04-18 DIAGNOSIS — M5136 Other intervertebral disc degeneration, lumbar region: Secondary | ICD-10-CM | POA: Diagnosis not present

## 2016-04-18 DIAGNOSIS — M961 Postlaminectomy syndrome, not elsewhere classified: Secondary | ICD-10-CM | POA: Diagnosis not present

## 2016-04-20 DIAGNOSIS — E782 Mixed hyperlipidemia: Secondary | ICD-10-CM | POA: Diagnosis not present

## 2016-04-20 DIAGNOSIS — I10 Essential (primary) hypertension: Secondary | ICD-10-CM | POA: Diagnosis not present

## 2016-04-20 DIAGNOSIS — K219 Gastro-esophageal reflux disease without esophagitis: Secondary | ICD-10-CM | POA: Diagnosis not present

## 2016-04-20 DIAGNOSIS — Z1212 Encounter for screening for malignant neoplasm of rectum: Secondary | ICD-10-CM | POA: Diagnosis not present

## 2016-04-20 DIAGNOSIS — J301 Allergic rhinitis due to pollen: Secondary | ICD-10-CM | POA: Diagnosis not present

## 2016-04-20 DIAGNOSIS — Z23 Encounter for immunization: Secondary | ICD-10-CM | POA: Diagnosis not present

## 2016-05-06 DIAGNOSIS — M961 Postlaminectomy syndrome, not elsewhere classified: Secondary | ICD-10-CM | POA: Diagnosis not present

## 2016-05-26 DIAGNOSIS — J0101 Acute recurrent maxillary sinusitis: Secondary | ICD-10-CM | POA: Diagnosis not present

## 2016-06-05 DIAGNOSIS — L040 Acute lymphadenitis of face, head and neck: Secondary | ICD-10-CM | POA: Diagnosis not present

## 2016-06-11 DIAGNOSIS — N816 Rectocele: Secondary | ICD-10-CM | POA: Diagnosis not present

## 2016-06-18 ENCOUNTER — Ambulatory Visit (INDEPENDENT_AMBULATORY_CARE_PROVIDER_SITE_OTHER): Payer: Medicare Other | Admitting: Otolaryngology

## 2016-06-18 DIAGNOSIS — H903 Sensorineural hearing loss, bilateral: Secondary | ICD-10-CM | POA: Diagnosis not present

## 2016-06-18 DIAGNOSIS — R490 Dysphonia: Secondary | ICD-10-CM | POA: Diagnosis not present

## 2016-06-18 DIAGNOSIS — K219 Gastro-esophageal reflux disease without esophagitis: Secondary | ICD-10-CM | POA: Diagnosis not present

## 2016-06-18 DIAGNOSIS — H9313 Tinnitus, bilateral: Secondary | ICD-10-CM

## 2016-06-26 DIAGNOSIS — M5481 Occipital neuralgia: Secondary | ICD-10-CM | POA: Diagnosis not present

## 2016-07-13 DIAGNOSIS — J069 Acute upper respiratory infection, unspecified: Secondary | ICD-10-CM | POA: Diagnosis not present

## 2016-07-16 DIAGNOSIS — J209 Acute bronchitis, unspecified: Secondary | ICD-10-CM | POA: Diagnosis not present

## 2016-07-16 DIAGNOSIS — R05 Cough: Secondary | ICD-10-CM | POA: Diagnosis not present

## 2016-07-20 DIAGNOSIS — R05 Cough: Secondary | ICD-10-CM | POA: Diagnosis not present

## 2016-07-20 DIAGNOSIS — R0602 Shortness of breath: Secondary | ICD-10-CM | POA: Diagnosis not present

## 2016-09-24 DIAGNOSIS — M1990 Unspecified osteoarthritis, unspecified site: Secondary | ICD-10-CM | POA: Diagnosis not present

## 2016-09-24 DIAGNOSIS — M25561 Pain in right knee: Secondary | ICD-10-CM | POA: Diagnosis not present

## 2016-10-02 DIAGNOSIS — S30861A Insect bite (nonvenomous) of abdominal wall, initial encounter: Secondary | ICD-10-CM | POA: Diagnosis not present

## 2016-10-05 DIAGNOSIS — M5136 Other intervertebral disc degeneration, lumbar region: Secondary | ICD-10-CM | POA: Diagnosis not present

## 2016-10-05 DIAGNOSIS — Z1231 Encounter for screening mammogram for malignant neoplasm of breast: Secondary | ICD-10-CM | POA: Diagnosis not present

## 2016-10-05 DIAGNOSIS — M961 Postlaminectomy syndrome, not elsewhere classified: Secondary | ICD-10-CM | POA: Diagnosis not present

## 2016-10-05 DIAGNOSIS — N816 Rectocele: Secondary | ICD-10-CM | POA: Diagnosis not present

## 2016-10-07 ENCOUNTER — Other Ambulatory Visit: Payer: Self-pay | Admitting: Obstetrics & Gynecology

## 2016-10-07 DIAGNOSIS — R928 Other abnormal and inconclusive findings on diagnostic imaging of breast: Secondary | ICD-10-CM

## 2016-10-09 ENCOUNTER — Ambulatory Visit
Admission: RE | Admit: 2016-10-09 | Discharge: 2016-10-09 | Disposition: A | Discharge status: Home / Self Care | Payer: Medicare Other | Source: Ambulatory Visit | Attending: Obstetrics & Gynecology | Admitting: Obstetrics & Gynecology

## 2016-10-09 DIAGNOSIS — N6489 Other specified disorders of breast: Secondary | ICD-10-CM | POA: Diagnosis not present

## 2016-10-09 DIAGNOSIS — R928 Other abnormal and inconclusive findings on diagnostic imaging of breast: Secondary | ICD-10-CM

## 2016-10-16 ENCOUNTER — Other Ambulatory Visit: Payer: Self-pay | Admitting: Obstetrics & Gynecology

## 2016-10-16 DIAGNOSIS — E2839 Other primary ovarian failure: Secondary | ICD-10-CM

## 2016-10-20 DIAGNOSIS — M961 Postlaminectomy syndrome, not elsewhere classified: Secondary | ICD-10-CM | POA: Diagnosis not present

## 2016-11-24 DIAGNOSIS — I10 Essential (primary) hypertension: Secondary | ICD-10-CM | POA: Diagnosis not present

## 2016-11-24 DIAGNOSIS — K210 Gastro-esophageal reflux disease with esophagitis: Secondary | ICD-10-CM | POA: Diagnosis not present

## 2016-11-24 DIAGNOSIS — E782 Mixed hyperlipidemia: Secondary | ICD-10-CM | POA: Diagnosis not present

## 2016-11-24 DIAGNOSIS — M1990 Unspecified osteoarthritis, unspecified site: Secondary | ICD-10-CM | POA: Diagnosis not present

## 2016-11-26 DIAGNOSIS — J301 Allergic rhinitis due to pollen: Secondary | ICD-10-CM | POA: Diagnosis not present

## 2016-11-26 DIAGNOSIS — I10 Essential (primary) hypertension: Secondary | ICD-10-CM | POA: Diagnosis not present

## 2016-11-26 DIAGNOSIS — E782 Mixed hyperlipidemia: Secondary | ICD-10-CM | POA: Diagnosis not present

## 2016-11-26 DIAGNOSIS — Z1212 Encounter for screening for malignant neoplasm of rectum: Secondary | ICD-10-CM | POA: Diagnosis not present

## 2016-11-26 DIAGNOSIS — K219 Gastro-esophageal reflux disease without esophagitis: Secondary | ICD-10-CM | POA: Diagnosis not present

## 2016-11-26 DIAGNOSIS — Z0001 Encounter for general adult medical examination with abnormal findings: Secondary | ICD-10-CM | POA: Diagnosis not present

## 2017-03-18 DIAGNOSIS — H04123 Dry eye syndrome of bilateral lacrimal glands: Secondary | ICD-10-CM | POA: Diagnosis not present

## 2017-03-18 DIAGNOSIS — Z961 Presence of intraocular lens: Secondary | ICD-10-CM | POA: Diagnosis not present

## 2017-03-18 DIAGNOSIS — H5212 Myopia, left eye: Secondary | ICD-10-CM | POA: Diagnosis not present

## 2017-03-18 DIAGNOSIS — H43813 Vitreous degeneration, bilateral: Secondary | ICD-10-CM | POA: Diagnosis not present

## 2017-04-08 DIAGNOSIS — Z23 Encounter for immunization: Secondary | ICD-10-CM | POA: Diagnosis not present

## 2017-04-12 DIAGNOSIS — N816 Rectocele: Secondary | ICD-10-CM | POA: Diagnosis not present

## 2017-04-22 DIAGNOSIS — M5136 Other intervertebral disc degeneration, lumbar region: Secondary | ICD-10-CM | POA: Diagnosis not present

## 2017-04-22 DIAGNOSIS — M961 Postlaminectomy syndrome, not elsewhere classified: Secondary | ICD-10-CM | POA: Diagnosis not present

## 2017-05-07 DIAGNOSIS — J209 Acute bronchitis, unspecified: Secondary | ICD-10-CM | POA: Diagnosis not present

## 2017-05-07 DIAGNOSIS — R05 Cough: Secondary | ICD-10-CM | POA: Diagnosis not present

## 2017-05-18 DIAGNOSIS — Z9189 Other specified personal risk factors, not elsewhere classified: Secondary | ICD-10-CM | POA: Diagnosis not present

## 2017-05-18 DIAGNOSIS — I10 Essential (primary) hypertension: Secondary | ICD-10-CM | POA: Diagnosis not present

## 2017-05-18 DIAGNOSIS — K210 Gastro-esophageal reflux disease with esophagitis: Secondary | ICD-10-CM | POA: Diagnosis not present

## 2017-05-18 DIAGNOSIS — E782 Mixed hyperlipidemia: Secondary | ICD-10-CM | POA: Diagnosis not present

## 2017-05-25 DIAGNOSIS — I10 Essential (primary) hypertension: Secondary | ICD-10-CM | POA: Diagnosis not present

## 2017-05-25 DIAGNOSIS — E782 Mixed hyperlipidemia: Secondary | ICD-10-CM | POA: Diagnosis not present

## 2017-05-25 DIAGNOSIS — K219 Gastro-esophageal reflux disease without esophagitis: Secondary | ICD-10-CM | POA: Diagnosis not present

## 2017-05-25 DIAGNOSIS — Z1212 Encounter for screening for malignant neoplasm of rectum: Secondary | ICD-10-CM | POA: Diagnosis not present

## 2017-05-25 DIAGNOSIS — J301 Allergic rhinitis due to pollen: Secondary | ICD-10-CM | POA: Diagnosis not present

## 2017-07-22 DIAGNOSIS — Z1389 Encounter for screening for other disorder: Secondary | ICD-10-CM | POA: Diagnosis not present

## 2017-07-22 DIAGNOSIS — E782 Mixed hyperlipidemia: Secondary | ICD-10-CM | POA: Diagnosis not present

## 2017-07-22 DIAGNOSIS — I10 Essential (primary) hypertension: Secondary | ICD-10-CM | POA: Diagnosis not present

## 2017-07-22 DIAGNOSIS — Z1212 Encounter for screening for malignant neoplasm of rectum: Secondary | ICD-10-CM | POA: Diagnosis not present

## 2017-07-22 DIAGNOSIS — K219 Gastro-esophageal reflux disease without esophagitis: Secondary | ICD-10-CM | POA: Diagnosis not present

## 2017-07-22 DIAGNOSIS — J301 Allergic rhinitis due to pollen: Secondary | ICD-10-CM | POA: Diagnosis not present

## 2017-08-09 DIAGNOSIS — R202 Paresthesia of skin: Secondary | ICD-10-CM | POA: Diagnosis not present

## 2017-08-09 DIAGNOSIS — L309 Dermatitis, unspecified: Secondary | ICD-10-CM | POA: Diagnosis not present

## 2017-08-09 DIAGNOSIS — D229 Melanocytic nevi, unspecified: Secondary | ICD-10-CM | POA: Diagnosis not present

## 2017-09-18 DIAGNOSIS — M5136 Other intervertebral disc degeneration, lumbar region: Secondary | ICD-10-CM | POA: Diagnosis not present

## 2017-09-18 DIAGNOSIS — M5030 Other cervical disc degeneration, unspecified cervical region: Secondary | ICD-10-CM | POA: Diagnosis not present

## 2017-09-18 DIAGNOSIS — M961 Postlaminectomy syndrome, not elsewhere classified: Secondary | ICD-10-CM | POA: Diagnosis not present

## 2017-09-25 DIAGNOSIS — M5030 Other cervical disc degeneration, unspecified cervical region: Secondary | ICD-10-CM | POA: Diagnosis not present

## 2017-10-21 DIAGNOSIS — M5030 Other cervical disc degeneration, unspecified cervical region: Secondary | ICD-10-CM | POA: Diagnosis not present

## 2017-10-21 DIAGNOSIS — M5136 Other intervertebral disc degeneration, lumbar region: Secondary | ICD-10-CM | POA: Diagnosis not present

## 2017-11-16 DIAGNOSIS — K210 Gastro-esophageal reflux disease with esophagitis: Secondary | ICD-10-CM | POA: Diagnosis not present

## 2017-11-16 DIAGNOSIS — I351 Nonrheumatic aortic (valve) insufficiency: Secondary | ICD-10-CM | POA: Diagnosis not present

## 2017-11-16 DIAGNOSIS — I10 Essential (primary) hypertension: Secondary | ICD-10-CM | POA: Diagnosis not present

## 2017-11-16 DIAGNOSIS — E782 Mixed hyperlipidemia: Secondary | ICD-10-CM | POA: Diagnosis not present

## 2017-11-18 DIAGNOSIS — Z23 Encounter for immunization: Secondary | ICD-10-CM | POA: Diagnosis not present

## 2017-11-18 DIAGNOSIS — E782 Mixed hyperlipidemia: Secondary | ICD-10-CM | POA: Diagnosis not present

## 2017-11-18 DIAGNOSIS — K219 Gastro-esophageal reflux disease without esophagitis: Secondary | ICD-10-CM | POA: Diagnosis not present

## 2017-11-18 DIAGNOSIS — Z0001 Encounter for general adult medical examination with abnormal findings: Secondary | ICD-10-CM | POA: Diagnosis not present

## 2017-11-18 DIAGNOSIS — I10 Essential (primary) hypertension: Secondary | ICD-10-CM | POA: Diagnosis not present

## 2018-01-03 DIAGNOSIS — J301 Allergic rhinitis due to pollen: Secondary | ICD-10-CM | POA: Diagnosis not present

## 2018-01-03 DIAGNOSIS — R51 Headache: Secondary | ICD-10-CM | POA: Diagnosis not present

## 2018-01-07 DIAGNOSIS — J028 Acute pharyngitis due to other specified organisms: Secondary | ICD-10-CM | POA: Diagnosis not present

## 2018-01-07 DIAGNOSIS — N816 Rectocele: Secondary | ICD-10-CM | POA: Diagnosis not present

## 2018-01-07 DIAGNOSIS — J301 Allergic rhinitis due to pollen: Secondary | ICD-10-CM | POA: Diagnosis not present

## 2018-01-07 DIAGNOSIS — Z124 Encounter for screening for malignant neoplasm of cervix: Secondary | ICD-10-CM | POA: Diagnosis not present

## 2018-01-07 DIAGNOSIS — Z1231 Encounter for screening mammogram for malignant neoplasm of breast: Secondary | ICD-10-CM | POA: Diagnosis not present

## 2018-01-25 DIAGNOSIS — N816 Rectocele: Secondary | ICD-10-CM | POA: Diagnosis not present

## 2018-02-03 DIAGNOSIS — L03031 Cellulitis of right toe: Secondary | ICD-10-CM | POA: Diagnosis not present

## 2018-02-03 DIAGNOSIS — M79674 Pain in right toe(s): Secondary | ICD-10-CM | POA: Diagnosis not present

## 2018-02-22 DIAGNOSIS — K219 Gastro-esophageal reflux disease without esophagitis: Secondary | ICD-10-CM | POA: Diagnosis not present

## 2018-02-22 DIAGNOSIS — J301 Allergic rhinitis due to pollen: Secondary | ICD-10-CM | POA: Diagnosis not present

## 2018-02-22 DIAGNOSIS — I10 Essential (primary) hypertension: Secondary | ICD-10-CM | POA: Diagnosis not present

## 2018-02-22 DIAGNOSIS — L600 Ingrowing nail: Secondary | ICD-10-CM | POA: Diagnosis not present

## 2018-02-22 DIAGNOSIS — E782 Mixed hyperlipidemia: Secondary | ICD-10-CM | POA: Diagnosis not present

## 2018-03-21 DIAGNOSIS — Z23 Encounter for immunization: Secondary | ICD-10-CM | POA: Diagnosis not present

## 2018-03-21 DIAGNOSIS — H524 Presbyopia: Secondary | ICD-10-CM | POA: Diagnosis not present

## 2018-03-21 DIAGNOSIS — H40013 Open angle with borderline findings, low risk, bilateral: Secondary | ICD-10-CM | POA: Diagnosis not present

## 2018-03-21 DIAGNOSIS — H43813 Vitreous degeneration, bilateral: Secondary | ICD-10-CM | POA: Diagnosis not present

## 2018-03-21 DIAGNOSIS — Z961 Presence of intraocular lens: Secondary | ICD-10-CM | POA: Diagnosis not present

## 2018-05-19 DIAGNOSIS — Z7982 Long term (current) use of aspirin: Secondary | ICD-10-CM | POA: Diagnosis not present

## 2018-05-19 DIAGNOSIS — Z79899 Other long term (current) drug therapy: Secondary | ICD-10-CM | POA: Diagnosis not present

## 2018-05-19 DIAGNOSIS — Z888 Allergy status to other drugs, medicaments and biological substances status: Secondary | ICD-10-CM | POA: Diagnosis not present

## 2018-05-19 DIAGNOSIS — G459 Transient cerebral ischemic attack, unspecified: Secondary | ICD-10-CM | POA: Diagnosis not present

## 2018-05-19 DIAGNOSIS — Z8673 Personal history of transient ischemic attack (TIA), and cerebral infarction without residual deficits: Secondary | ICD-10-CM | POA: Diagnosis not present

## 2018-05-19 DIAGNOSIS — I10 Essential (primary) hypertension: Secondary | ICD-10-CM | POA: Diagnosis not present

## 2018-05-19 DIAGNOSIS — K219 Gastro-esophageal reflux disease without esophagitis: Secondary | ICD-10-CM | POA: Diagnosis not present

## 2018-05-24 DIAGNOSIS — I10 Essential (primary) hypertension: Secondary | ICD-10-CM | POA: Diagnosis not present

## 2018-05-24 DIAGNOSIS — J320 Chronic maxillary sinusitis: Secondary | ICD-10-CM | POA: Diagnosis not present

## 2018-05-24 DIAGNOSIS — G459 Transient cerebral ischemic attack, unspecified: Secondary | ICD-10-CM | POA: Diagnosis not present

## 2018-06-27 DIAGNOSIS — D126 Benign neoplasm of colon, unspecified: Secondary | ICD-10-CM | POA: Diagnosis not present

## 2018-07-07 DIAGNOSIS — N816 Rectocele: Secondary | ICD-10-CM | POA: Diagnosis not present

## 2018-07-28 DIAGNOSIS — G43909 Migraine, unspecified, not intractable, without status migrainosus: Secondary | ICD-10-CM | POA: Diagnosis not present

## 2018-07-28 DIAGNOSIS — K219 Gastro-esophageal reflux disease without esophagitis: Secondary | ICD-10-CM | POA: Diagnosis not present

## 2018-07-28 DIAGNOSIS — I10 Essential (primary) hypertension: Secondary | ICD-10-CM | POA: Diagnosis not present

## 2018-07-28 DIAGNOSIS — Z09 Encounter for follow-up examination after completed treatment for conditions other than malignant neoplasm: Secondary | ICD-10-CM | POA: Diagnosis not present

## 2018-07-28 DIAGNOSIS — Z86010 Personal history of colonic polyps: Secondary | ICD-10-CM | POA: Diagnosis not present

## 2018-07-28 DIAGNOSIS — Z8673 Personal history of transient ischemic attack (TIA), and cerebral infarction without residual deficits: Secondary | ICD-10-CM | POA: Diagnosis not present

## 2018-07-28 DIAGNOSIS — Z1211 Encounter for screening for malignant neoplasm of colon: Secondary | ICD-10-CM | POA: Diagnosis not present

## 2018-07-28 DIAGNOSIS — M1990 Unspecified osteoarthritis, unspecified site: Secondary | ICD-10-CM | POA: Diagnosis not present

## 2018-08-24 DIAGNOSIS — R05 Cough: Secondary | ICD-10-CM | POA: Diagnosis not present

## 2018-09-01 DIAGNOSIS — M5030 Other cervical disc degeneration, unspecified cervical region: Secondary | ICD-10-CM | POA: Diagnosis not present

## 2018-09-01 DIAGNOSIS — M5136 Other intervertebral disc degeneration, lumbar region: Secondary | ICD-10-CM | POA: Diagnosis not present

## 2018-09-01 DIAGNOSIS — M961 Postlaminectomy syndrome, not elsewhere classified: Secondary | ICD-10-CM | POA: Diagnosis not present

## 2018-09-07 DIAGNOSIS — I10 Essential (primary) hypertension: Secondary | ICD-10-CM | POA: Diagnosis not present

## 2018-10-10 DIAGNOSIS — G459 Transient cerebral ischemic attack, unspecified: Secondary | ICD-10-CM | POA: Diagnosis not present

## 2018-10-10 DIAGNOSIS — J301 Allergic rhinitis due to pollen: Secondary | ICD-10-CM | POA: Diagnosis not present

## 2018-10-10 DIAGNOSIS — I10 Essential (primary) hypertension: Secondary | ICD-10-CM | POA: Diagnosis not present

## 2018-10-10 DIAGNOSIS — E782 Mixed hyperlipidemia: Secondary | ICD-10-CM | POA: Diagnosis not present

## 2018-10-10 DIAGNOSIS — K219 Gastro-esophageal reflux disease without esophagitis: Secondary | ICD-10-CM | POA: Diagnosis not present

## 2018-11-03 DIAGNOSIS — M25551 Pain in right hip: Secondary | ICD-10-CM | POA: Diagnosis not present

## 2018-11-18 DIAGNOSIS — M25551 Pain in right hip: Secondary | ICD-10-CM | POA: Diagnosis not present

## 2018-11-18 DIAGNOSIS — M545 Low back pain: Secondary | ICD-10-CM | POA: Diagnosis not present

## 2018-11-28 ENCOUNTER — Other Ambulatory Visit: Payer: Self-pay | Admitting: Physical Medicine and Rehabilitation

## 2018-11-28 DIAGNOSIS — R1900 Intra-abdominal and pelvic swelling, mass and lump, unspecified site: Secondary | ICD-10-CM

## 2018-11-29 ENCOUNTER — Ambulatory Visit
Admission: RE | Admit: 2018-11-29 | Discharge: 2018-11-29 | Disposition: A | Discharge status: Home / Self Care | Payer: Medicare Other | Source: Ambulatory Visit | Attending: Physical Medicine and Rehabilitation | Admitting: Physical Medicine and Rehabilitation

## 2018-11-29 DIAGNOSIS — R590 Localized enlarged lymph nodes: Secondary | ICD-10-CM | POA: Diagnosis not present

## 2018-11-29 DIAGNOSIS — C482 Malignant neoplasm of peritoneum, unspecified: Secondary | ICD-10-CM | POA: Diagnosis not present

## 2018-11-29 DIAGNOSIS — R1900 Intra-abdominal and pelvic swelling, mass and lump, unspecified site: Secondary | ICD-10-CM

## 2018-11-29 DIAGNOSIS — R188 Other ascites: Secondary | ICD-10-CM | POA: Diagnosis not present

## 2018-11-29 MED ORDER — IOPAMIDOL (ISOVUE-300) INJECTION 61%
100.0000 mL | Freq: Once | INTRAVENOUS | Status: AC | PRN
  Administered 2018-11-29: 100 mL via INTRAVENOUS

## 2018-12-01 ENCOUNTER — Telehealth: Payer: Self-pay | Admitting: *Deleted

## 2018-12-01 NOTE — Telephone Encounter (Signed)
Called and spoke with the patient regarding her referral appt. Gave the patient the date and time of the 6/8 appt. Gave the appt instructions, address and procedures.

## 2018-12-05 ENCOUNTER — Encounter: Payer: Self-pay | Admitting: Gynecologic Oncology

## 2018-12-05 ENCOUNTER — Inpatient Hospital Stay: Payer: Medicare Other

## 2018-12-05 ENCOUNTER — Encounter: Payer: Self-pay | Admitting: Oncology

## 2018-12-05 ENCOUNTER — Inpatient Hospital Stay: Payer: Medicare Other | Attending: Gynecologic Oncology | Admitting: Gynecologic Oncology

## 2018-12-05 ENCOUNTER — Other Ambulatory Visit: Payer: Self-pay

## 2018-12-05 VITALS — BP 131/54 | HR 74 | Temp 98.3°F | Resp 19 | Ht 67.0 in | Wt 213.8 lb

## 2018-12-05 DIAGNOSIS — C800 Disseminated malignant neoplasm, unspecified: Secondary | ICD-10-CM | POA: Insufficient documentation

## 2018-12-05 DIAGNOSIS — C772 Secondary and unspecified malignant neoplasm of intra-abdominal lymph nodes: Secondary | ICD-10-CM | POA: Diagnosis not present

## 2018-12-05 DIAGNOSIS — R1900 Intra-abdominal and pelvic swelling, mass and lump, unspecified site: Secondary | ICD-10-CM

## 2018-12-05 DIAGNOSIS — J90 Pleural effusion, not elsewhere classified: Secondary | ICD-10-CM

## 2018-12-05 DIAGNOSIS — R971 Elevated cancer antigen 125 [CA 125]: Secondary | ICD-10-CM | POA: Insufficient documentation

## 2018-12-05 DIAGNOSIS — C786 Secondary malignant neoplasm of retroperitoneum and peritoneum: Secondary | ICD-10-CM

## 2018-12-05 DIAGNOSIS — Z90710 Acquired absence of both cervix and uterus: Secondary | ICD-10-CM | POA: Diagnosis not present

## 2018-12-05 DIAGNOSIS — R140 Abdominal distension (gaseous): Secondary | ICD-10-CM | POA: Diagnosis not present

## 2018-12-05 DIAGNOSIS — K219 Gastro-esophageal reflux disease without esophagitis: Secondary | ICD-10-CM | POA: Diagnosis not present

## 2018-12-05 DIAGNOSIS — R6881 Early satiety: Secondary | ICD-10-CM | POA: Diagnosis not present

## 2018-12-05 LAB — BASIC METABOLIC PANEL
Anion gap: 10 (ref 5–15)
BUN: 19 mg/dL (ref 8–23)
CO2: 24 mmol/L (ref 22–32)
Calcium: 9.7 mg/dL (ref 8.9–10.3)
Chloride: 104 mmol/L (ref 98–111)
Creatinine, Ser: 0.9 mg/dL (ref 0.44–1.00)
GFR calc Af Amer: >60 mL/min (ref >60)
GFR calc non Af Amer: >60 mL/min (ref >60)
Glucose, Bld: 89 mg/dL (ref 70–99)
Potassium: 4.2 mmol/L (ref 3.5–5.1)
Sodium: 138 mmol/L (ref 135–145)

## 2018-12-05 NOTE — Progress Notes (Signed)
Consult Note: Gyn-Onc  Consult was requested by Dr. Benjie Karvonen for the evaluation of Paula Peterson 78 y.o. female  CC:  Chief Complaint  Patient presents with  . Pleural effusion, right  . Carcinomatosis [facility])   Assessment/Plan:  Paula Peterson  is a 78 y.o.  year old with apparent stage IV ovarian cancer.  I reviewed her scans with her with her daughter on the phone to listen in. I showed her the disease in her chest, porta hepatis, omentum and pelvic mass.  I discussed that the treatment approach for this disease is typically combination of cytoreductive surgery and chemotherapy. I discussed that sequencing of this can be either with upfront debulking followed by adjuvant chemotherapy sequentially or neoadjuvant chemotherapy followed by an interval cytoreductive attempt, then additional chemotherapy. This latter approach is associated with a reduced perioperative morbidity at the time of surgery, and, in stage IV disease, more likely to be associated with complete or optimal cytoreduction which is an important goal associated with improved survival. I discussed that the goal of optimal sequencing is to optimise the likelihood that cytoreductive effect can be optimal to less than 1 cm of residual disease, and would not induce morbidity for the patient that would result in a delay of adjuvant chemotherapy. I discussed that it is an individual decision process that takes into account individual patient health, and preference factors, in addition to the apparent tumor distribution on imaging. I discussed that the overall survival observed in patients is equivalent for both approaches provided that there is an optimal cytoreductive effort at the time of surgery (regardless of the timing of that surgery).  We will proceed with imaging of the chest to document the measurable disease at that location. We will obtain histology from the omentum and perform thoracentesis to establish whether there is a malignant  effusion and to improve SOB symtpoms. If ovarian cancer is established on pathology we will proceed with referral to medical oncology and 3 cycles of neoadjuvant carboplatin and paclitaxel.   She will have repeat CT imaging at that time and we will evaluate for response to chemotherapy and consideration for interval cytoreduction. I explained that after surgery, additional chemotherpay is necessary.  She will have evaluation by genetics for evaluation for HRD germline or somatic, and if present, we will consider her for maintenance PARP inhibitor therapy after completing chemotherapy.   HPI: Paula Peterson is a 78 year old P4 who is seen in consultation at the request of Dr Benjie Karvonen for apparent stage IV ovarian cancer.   Her symptoms began in April/May, 2020.  She has bloating and early satiety. Shortness of breath with walking. She denied bleeding. She reported constipation with pain with defecation and narrowed stools.   CT abd/pelvis on 11/29/18 showed a moderate size right pleural effusion, a 1.1cm porta hepatis lymph node and a 18.4 x 9.8 cm cystic mass in the pelvis. There was only a scant amount of pelvic ascites present.   CA 125 was drawn on 12/05/18 and was not resulted as of yet.   The ptaient's medical history is significant for lumbago, "weak, loose vocal cords", reflux. She had a total hysterectomy via laparotomy for menorrhagia in her 30's. Prior to that she'd had 4 vaginal deliveries.   Current Meds:  Outpatient Encounter Medications as of 12/05/2018  Medication Sig  . aspirin EC 81 MG tablet Take 81 mg by mouth daily.  . cetirizine (ZYRTEC) 10 MG tablet Take 10 mg by mouth daily.  Marland Kitchen  Coenzyme Q10 (CO Q 10 PO) Take 200 mg by mouth daily.  Marland Kitchen ESTRADIOL ACETATE PO Take 20 mg by mouth daily.  Marland Kitchen ibuprofen (ADVIL) 200 MG tablet Take 200-400 mg by mouth every 6 (six) hours as needed.  Marland Kitchen losartan (COZAAR) 50 MG tablet Take 50 mg by mouth daily.  Marland Kitchen omeprazole (PRILOSEC) 20 MG capsule Take 20  mg by mouth daily.  . polyethylene glycol (MIRALAX / GLYCOLAX) 17 g packet Take 17 g by mouth 2 (two) times daily as needed.  . vitamin B-12 (CYANOCOBALAMIN) 500 MCG tablet Take 500 mcg by mouth daily.  Marland Kitchen estradiol (ESTRACE) 1 MG tablet Take 1 mg by mouth daily.  . [DISCONTINUED] calcium-vitamin D (OSCAL WITH D) 500-200 MG-UNIT per tablet Take 1 tablet by mouth daily.  . [DISCONTINUED] MAGNESIUM PO Take 1 tablet by mouth daily.  . [DISCONTINUED] Multiple Vitamin (MULITIVITAMIN WITH MINERALS) TABS Take 1 tablet by mouth daily.  . [DISCONTINUED] vitamin B-12 (CYANOCOBALAMIN) 250 MCG tablet Take 250 mcg by mouth daily.  . [DISCONTINUED] vitamin C (ASCORBIC ACID) 500 MG tablet Take 500 mg by mouth daily.   No facility-administered encounter medications on file as of 12/05/2018.     Allergy: No Known Allergies  Social Hx:   Social History   Socioeconomic History  . Marital status: Married    Spouse name: Not on file  . Number of children: Not on file  . Years of education: Not on file  . Highest education level: Not on file  Occupational History  . Not on file  Social Needs  . Financial resource strain: Not on file  . Food insecurity:    Worry: Not on file    Inability: Not on file  . Transportation needs:    Medical: Not on file    Non-medical: Not on file  Tobacco Use  . Smoking status: Never Smoker  . Smokeless tobacco: Never Used  Substance and Sexual Activity  . Alcohol use: No  . Drug use: No  . Sexual activity: Not Currently  Lifestyle  . Physical activity:    Days per week: Not on file    Minutes per session: Not on file  . Stress: Not on file  Relationships  . Social connections:    Talks on phone: Not on file    Gets together: Not on file    Attends religious service: Not on file    Active member of club or organization: Not on file    Attends meetings of clubs or organizations: Not on file    Relationship status: Not on file  . Intimate partner violence:     Fear of current or ex partner: Not on file    Emotionally abused: Not on file    Physically abused: Not on file    Forced sexual activity: Not on file  Other Topics Concern  . Not on file  Social History Narrative  . Not on file    Past Surgical Hx:  Past Surgical History:  Procedure Laterality Date  . ABDOMINAL HYSTERECTOMY    . BACK SURGERY    . FRACTURE SURGERY      Past Medical Hx:  Past Medical History:  Diagnosis Date  . Back pain   . GERD (gastroesophageal reflux disease)   . Pessary maintenance     Past Gynecological History:  SVD x 4, hysterectomy for benign menorrhagia in her 30's (total abdominal) No LMP recorded. Patient has had a hysterectomy.  Family Hx:  Family History  Problem Relation Age of Onset  . Ovarian cancer Sister     Sister had ovarian cancer age 71's. She was treated with surgery and chemotherapy. She survived her cancer but died from CHF and DM complications.   Review of Systems:  Constitutional  Feels well,    ENT Normal appearing ears and nares bilaterally Skin/Breast  No rash, sores, jaundice, itching, dryness Cardiovascular  No chest pain, + shortness of breath on exertion, no edema  Pulmonary  No cough or wheeze.  Gastro Intestinal  No nausea, vomitting, or diarrhoea. No bright red blood per rectum, no abdominal pain, change in bowel movement, +constipation. + bloating.  Genito Urinary  No frequency, urgency, dysuria, +pelvic pressure Musculo Skeletal  No myalgia, arthralgia, joint swelling or pain  Neurologic  No weakness, numbness, change in gait,  Psychology  No depression, anxiety, insomnia.   Vitals:  Blood pressure (!) 131/54, pulse 74, temperature 98.3 F (36.8 C), temperature source Oral, resp. rate 19, height 5' 7"  (1.702 m), weight 213 lb 12.8 oz (97 kg), SpO2 99 %.  Physical Exam: WD in NAD Neck  Supple NROM, without any enlargements.  Lymph Node Survey No cervical supraclavicular or inguinal  adenopathy Cardiovascular  Pulse normal rate, regularity and rhythm. S1 and S2 normal.  Lungs  Decreased BS bilaterally right > left. Skin  No rash/lesions/breakdown  Psychiatry  Alert and oriented to person, place, and time  Abdomen  Normoactive bowel sounds, abdomen soft, non-tender and mildly obese (BMI 33) without evidence of hernia.  Back No CVA tenderness Genito Urinary  Vulva/vagina: Normal external female genitalia.  No lesions. No discharge or bleeding.  Bladder/urethra:  No lesions or masses, well supported bladder  Vagina: normal  Cervix and uterus surgically absent  Adnexa: cystic fullness, smooth, filling cul de sac, immobile  Rectal  Good tone, no masses no discrete cul de sac nodularity.  Extremities  No bilateral cyanosis, clubbing or edema.   Thereasa Solo, MD  12/05/2018, 5:28 PM

## 2018-12-05 NOTE — Patient Instructions (Signed)
Dr Denman George feels that this is a stage 4 ovarian cancer. She is recommending chemotherapy for at least 3 doses, followed by a scan and possibly a surgery to remove the ovary, followed by additional 3 doses of chemotherapy.  She will facilitate a biopsy of the abdominal tumor nodules as well as a drawing off of the fluid from the right lung space. She will arrange a CT scan of the chest.  She will refer you to see the chemotherapy doctor (Dr [name]) who is a medical oncologist who will discuss chemotherapy with you much further.  Dr Denman George will see you back after you have completed at least 3 cycles (doses) of chemotherapy.

## 2018-12-06 ENCOUNTER — Other Ambulatory Visit: Payer: Self-pay | Admitting: Hematology and Oncology

## 2018-12-06 ENCOUNTER — Telehealth: Payer: Self-pay | Admitting: Oncology

## 2018-12-06 ENCOUNTER — Encounter: Payer: Self-pay | Admitting: Hematology and Oncology

## 2018-12-06 ENCOUNTER — Other Ambulatory Visit (HOSPITAL_COMMUNITY)
Admission: RE | Admit: 2018-12-06 | Discharge: 2018-12-06 | Disposition: A | Discharge status: Home / Self Care | Payer: Medicare Other | Source: Ambulatory Visit | Attending: Gynecologic Oncology | Admitting: Gynecologic Oncology

## 2018-12-06 ENCOUNTER — Other Ambulatory Visit: Payer: Self-pay | Admitting: Gynecologic Oncology

## 2018-12-06 DIAGNOSIS — Z01812 Encounter for preprocedural laboratory examination: Secondary | ICD-10-CM | POA: Diagnosis not present

## 2018-12-06 DIAGNOSIS — Z1159 Encounter for screening for other viral diseases: Secondary | ICD-10-CM | POA: Insufficient documentation

## 2018-12-06 DIAGNOSIS — J90 Pleural effusion, not elsewhere classified: Secondary | ICD-10-CM | POA: Insufficient documentation

## 2018-12-06 DIAGNOSIS — C569 Malignant neoplasm of unspecified ovary: Secondary | ICD-10-CM | POA: Insufficient documentation

## 2018-12-06 DIAGNOSIS — C561 Malignant neoplasm of right ovary: Secondary | ICD-10-CM | POA: Insufficient documentation

## 2018-12-06 DIAGNOSIS — C800 Disseminated malignant neoplasm, unspecified: Secondary | ICD-10-CM

## 2018-12-06 LAB — CA 125: Cancer Antigen (CA) 125: 1015 U/mL — ABNORMAL HIGH (ref 0.0–38.1)

## 2018-12-06 NOTE — Telephone Encounter (Signed)
Called Paula Peterson and advised her of CA 125 results.  She verbalized understanding and agreement.

## 2018-12-06 NOTE — Progress Notes (Signed)
START ON PATHWAY REGIMEN - Ovarian     A cycle is every 21 days:     Paclitaxel      Carboplatin   **Always confirm dose/schedule in your pharmacy ordering system**  Patient Characteristics: Preoperative or Nonsurgical Candidate (Clinical Staging), Newly Diagnosed, Neoadjuvant Therapy followed by Surgery Therapeutic Status: Preoperative or Nonsurgical Candidate (Clinical Staging) BRCA Mutation Status: Awaiting Test Results AJCC T Category: cT3 AJCC 8 Stage Grouping: IV AJCC N Category: cN1 AJCC M Category: cM1 Therapy Plan: Neoadjuvant Therapy followed by Surgery Intent of Therapy: Curative Intent, Discussed with Patient

## 2018-12-07 ENCOUNTER — Inpatient Hospital Stay (HOSPITAL_BASED_OUTPATIENT_CLINIC_OR_DEPARTMENT_OTHER): Payer: Medicare Other | Admitting: Hematology and Oncology

## 2018-12-07 ENCOUNTER — Telehealth: Payer: Self-pay | Admitting: Oncology

## 2018-12-07 ENCOUNTER — Encounter: Payer: Self-pay | Admitting: Hematology and Oncology

## 2018-12-07 ENCOUNTER — Other Ambulatory Visit: Payer: Self-pay

## 2018-12-07 ENCOUNTER — Inpatient Hospital Stay: Payer: Medicare Other

## 2018-12-07 VITALS — BP 147/74 | HR 83 | Temp 98.9°F | Resp 18 | Ht 67.0 in | Wt 215.0 lb

## 2018-12-07 DIAGNOSIS — K219 Gastro-esophageal reflux disease without esophagitis: Secondary | ICD-10-CM | POA: Diagnosis not present

## 2018-12-07 DIAGNOSIS — R180 Malignant ascites: Secondary | ICD-10-CM

## 2018-12-07 DIAGNOSIS — C569 Malignant neoplasm of unspecified ovary: Secondary | ICD-10-CM

## 2018-12-07 DIAGNOSIS — K5909 Other constipation: Secondary | ICD-10-CM | POA: Diagnosis not present

## 2018-12-07 DIAGNOSIS — I10 Essential (primary) hypertension: Secondary | ICD-10-CM | POA: Diagnosis not present

## 2018-12-07 DIAGNOSIS — J90 Pleural effusion, not elsewhere classified: Secondary | ICD-10-CM | POA: Diagnosis not present

## 2018-12-07 DIAGNOSIS — R140 Abdominal distension (gaseous): Secondary | ICD-10-CM | POA: Diagnosis not present

## 2018-12-07 DIAGNOSIS — C800 Disseminated malignant neoplasm, unspecified: Secondary | ICD-10-CM

## 2018-12-07 DIAGNOSIS — R971 Elevated cancer antigen 125 [CA 125]: Secondary | ICD-10-CM | POA: Diagnosis not present

## 2018-12-07 DIAGNOSIS — C786 Secondary malignant neoplasm of retroperitoneum and peritoneum: Secondary | ICD-10-CM | POA: Diagnosis not present

## 2018-12-07 DIAGNOSIS — R6881 Early satiety: Secondary | ICD-10-CM | POA: Diagnosis not present

## 2018-12-07 DIAGNOSIS — C772 Secondary and unspecified malignant neoplasm of intra-abdominal lymph nodes: Secondary | ICD-10-CM | POA: Diagnosis not present

## 2018-12-07 LAB — NOVEL CORONAVIRUS, NAA (HOSP ORDER, SEND-OUT TO REF LAB; TAT 18-24 HRS): SARS-CoV-2, NAA: NOT DETECTED

## 2018-12-07 MED ORDER — LIDOCAINE-PRILOCAINE 2.5-2.5 % EX CREA: TOPICAL_CREAM | CUTANEOUS | 3 refills | Status: DC

## 2018-12-07 MED ORDER — ONDANSETRON HCL 8 MG PO TABS: 8.0000 mg | ORAL_TABLET | Freq: Three times a day (TID) | ORAL | 1 refills | Status: DC | PRN

## 2018-12-07 MED ORDER — DEXAMETHASONE 4 MG PO TABS: ORAL_TABLET | ORAL | 0 refills | Status: DC

## 2018-12-07 MED ORDER — PROCHLORPERAZINE MALEATE 10 MG PO TABS: 10.0000 mg | ORAL_TABLET | Freq: Four times a day (QID) | ORAL | 1 refills | Status: DC | PRN

## 2018-12-07 MED FILL — ONDANSETRON HCL 8 MG TABLET: 8 | 10 days supply | Qty: 30 | Fill #0

## 2018-12-07 MED FILL — LIDOCAINE-PRILOCAINE CREAM: 2.5-2.5 | 10 days supply | Qty: 30 | Fill #0

## 2018-12-07 MED FILL — PROCHLORPERAZINE 10 MG TAB: 10 | 7 days supply | Qty: 30 | Fill #0

## 2018-12-07 MED FILL — DEXAMETHASONE 4 MG TABLET: 4 | 84 days supply | Qty: 16 | Fill #0

## 2018-12-07 NOTE — Assessment & Plan Note (Signed)
I reviewed the plan of care with the patient and her granddaughter, Lovena Le who is a Equities trader over the phone Assuming that the diagnostic thoracentesis on 12/09/2018 is positive, we will treat her disease with presumption diagnosis of metastatic ovarian cancer The regimen of choice would be combination chemotherapy with carboplatin and Taxol, starting on December 16, 2018 The plan will be to give her 3 cycles of treatment in a neoadjuvant fashion followed by imaging study and possibility of interval debulking surgery if she had favorable response to treatment However, if her thoracentesis is nondiagnostic, she will proceed with schedule CT-guided biopsy on 12/15/2018 and her chemotherapy will be delayed We will stay in touch with the patient She will obtain chemo education class, port placement and blood work prior to starting treatment  We reviewed the NCCN guidelines We discussed the role of chemotherapy. The intent is of curative intent.  We discussed some of the risks, benefits, side-effects of carboplatin & Taxol. Treatment is intravenous, every 3 weeks x 6 cycles  Some of the short term side-effects included, though not limited to, including weight loss, life threatening infections, risk of allergic reactions, need for transfusions of blood products, nausea, vomiting, change in bowel habits, loss of hair, admission to hospital for various reasons, and risks of death.   Long term side-effects are also discussed including risks of infertility, permanent damage to nerve function, hearing loss, chronic fatigue, kidney damage with possibility needing hemodialysis, and rare secondary malignancy including bone marrow disorders.  The patient is aware that the response rates discussed earlier is not guaranteed.  After a long discussion, patient made an informed decision to proceed with the prescribed plan of care.   Patient education material was dispensed. We discussed premedication with  dexamethasone before chemotherapy. I do not plan prophylactic G-CSF support Due to morbid obesity, I plan to reduce starting dose of Taxol at 140 mg per metered square

## 2018-12-07 NOTE — Addendum Note (Signed)
Addended by: Thereasa Solo on: 12/07/2018 06:04 PM   Modules accepted: Orders

## 2018-12-07 NOTE — Progress Notes (Signed)
Paula Peterson CONSULT NOTE  Patient Care Team: Caryl Bis, MD as PCP - General (Family Medicine)  ASSESSMENT & PLAN:  Ovarian cancer [facility]) I reviewed the plan of care with the patient and her granddaughter, Lovena Le who is a Equities trader over the phone Assuming that the diagnostic thoracentesis on 12/09/2018 is positive, we will treat her disease with presumption diagnosis of metastatic ovarian cancer The regimen of choice would be combination chemotherapy with carboplatin and Taxol, starting on December 16, 2018 The plan will be to give her 3 cycles of treatment in a neoadjuvant fashion followed by imaging study and possibility of interval debulking surgery if she had favorable response to treatment However, if her thoracentesis is nondiagnostic, she will proceed with schedule CT-guided biopsy on 12/15/2018 and her chemotherapy will be delayed We will stay in touch with the patient She will obtain chemo education class, port placement and blood work prior to starting treatment  We reviewed the NCCN guidelines We discussed the role of chemotherapy. The intent is of curative intent.  We discussed some of the risks, benefits, side-effects of carboplatin & Taxol. Treatment is intravenous, every 3 weeks x 6 cycles  Some of the short term side-effects included, though not limited to, including weight loss, life threatening infections, risk of allergic reactions, need for transfusions of blood products, nausea, vomiting, change in bowel habits, loss of hair, admission to hospital for various reasons, and risks of death.   Long term side-effects are also discussed including risks of infertility, permanent damage to nerve function, hearing loss, chronic fatigue, kidney damage with possibility needing hemodialysis, and rare secondary malignancy including bone marrow disorders.  The patient is aware that the response rates discussed earlier is not guaranteed.  After a long discussion,  patient made an informed decision to proceed with the prescribed plan of care.   Patient education material was dispensed. We discussed premedication with dexamethasone before chemotherapy. I do not plan prophylactic G-CSF support Due to morbid obesity, I plan to reduce starting dose of Taxol at 140 mg per metered square  Bilateral pleural effusion She is mildly symptomatic with shortness of breath She will undergo diagnostic and therapeutic thoracentesis on Friday.  Other constipation Her symptoms of changes in bowel habit with constipation, bloating and sensation of acid reflux are related to carcinomatosis I recommend laxative therapy to avoid constipation.   Orders Placed This Encounter  Procedures  . IR IMAGING GUIDED PORT INSERTION    Standing Status:   Future    Standing Expiration Date:   02/06/2020    Order Specific Question:   Reason for Exam (SYMPTOM  OR DIAGNOSIS REQUIRED)    Answer:   need port for chemo on 6/19    Order Specific Question:   Preferred Imaging Location?    Answer:   [facility]     CHIEF COMPLAINTS/PURPOSE OF CONSULTATION:  Metastatic ovarian cancer causing pleural effusion with diffuse carcinomatosis, for new adjuvant chemotherapy Her granddaughter, Lovena Le is over the phone to collaborate her history  HISTORY OF PRESENTING ILLNESS:  Paula Peterson 78 y.o. female is here because of recent abnormal findings suggestive of metastatic ovarian cancer Biopsy is pending  Her symptoms started more than 6 months ago with bloating, reflux sensation and changes in bowel habits with new onset of constipation She also started to notice some shortness of breath and mild weight gain. She underwent further imaging study and was subsequently referred here I have reviewed her chart and materials  related to her cancer extensively and collaborated history with the patient. Summary of oncologic history is as follows:   Ovarian cancer ([place])   10/24/2018 Initial  Diagnosis    Her symptoms began in April/May, 2020.  She has bloating and early satiety. Shortness of breath with walking. She denied bleeding. She reported constipation with pain with defecation and narrowed stools    11/29/2018 Imaging    1. 13 cm complex cystic lesion in the central pelvis, highly suspicious for ovarian cystadenocarcinoma. 2. Diffuse peritoneal carcinomatosis with mild ascites. 3. Mild lymphadenopathy in porta hepatis and right cardiophrenic angle, suspicious for metastatic disease. 4. Moderate right and tiny left pleural effusions    12/05/2018 Tumor Marker    Patient's tumor was tested for the following markers: CA-125 Results of the tumor marker test revealed 1015.    12/06/2018 Cancer Staging    Staging form: Ovary, Fallopian Tube, and Primary Peritoneal Carcinoma, AJCC 8th Edition - Clinical: FIGO Stage IVA, calculated as Stage IV (cT3c, cN1, cM1) - Signed by [name], MD on 12/06/2018    She has daily bowel movement but small amount She continues to have significant bloating and reflux She has no residual stroke or neurological deficit She is otherwise active at home  MEDICAL HISTORY:  Past Medical History:  Diagnosis Date  . Back pain   . GERD (gastroesophageal reflux disease)   . Hypertension   . Pessary maintenance   . Stroke [facility])     SURGICAL HISTORY: Past Surgical History:  Procedure Laterality Date  . ABDOMINAL HYSTERECTOMY    . BACK SURGERY    . FRACTURE SURGERY      SOCIAL HISTORY: Social History   Socioeconomic History  . Marital status: Married    Spouse name: Fara Olden  . Number of children: 3  . Years of education: Not on file  . Highest education level: Not on file  Occupational History  . Occupation: retired Oncologist  Social Needs  . Financial resource strain: Not on file  . Food insecurity:    Worry: Not on file    Inability: Not on file  . Transportation needs:    Medical: Not on file    Non-medical: Not on file   Tobacco Use  . Smoking status: Never Smoker  . Smokeless tobacco: Never Used  Substance and Sexual Activity  . Alcohol use: No  . Drug use: No  . Sexual activity: Not Currently  Lifestyle  . Physical activity:    Days per week: Not on file    Minutes per session: Not on file  . Stress: Not on file  Relationships  . Social connections:    Talks on phone: Not on file    Gets together: Not on file    Attends religious service: Not on file    Active member of club or organization: Not on file    Attends meetings of clubs or organizations: Not on file    Relationship status: Not on file  . Intimate partner violence:    Fear of current or ex partner: Not on file    Emotionally abused: Not on file    Physically abused: Not on file    Forced sexual activity: Not on file  Other Topics Concern  . Not on file  Social History Narrative  . Not on file    FAMILY HISTORY: Family History  Problem Relation Age of Onset  . Ovarian cancer Sister        unknown cancer, maybe  not ovarian    ALLERGIES:  has No Known Allergies.  MEDICATIONS:  Current Outpatient Medications  Medication Sig Dispense Refill  . aspirin EC 81 MG tablet Take 81 mg by mouth daily.    . cetirizine (ZYRTEC) 10 MG tablet Take 10 mg by mouth daily.    . Coenzyme Q10 (CO Q 10 PO) Take 200 mg by mouth daily.    Marland Kitchen dexamethasone (DECADRON) 4 MG tablet Take 2 tabs at the night before and 2 tab the morning of chemotherapy, every 3 weeks, by mouth with food 24 tablet 0  . estradiol (ESTRACE) 1 MG tablet Take 1 mg by mouth daily.    Marland Kitchen ESTRADIOL ACETATE PO Take 20 mg by mouth daily.    Marland Kitchen ibuprofen (ADVIL) 200 MG tablet Take 200-400 mg by mouth every 6 (six) hours as needed.    . lidocaine-prilocaine (EMLA) cream Apply to affected area once 30 g 3  . losartan (COZAAR) 50 MG tablet Take 50 mg by mouth daily.    Marland Kitchen omeprazole (PRILOSEC) 20 MG capsule Take 20 mg by mouth daily.    . ondansetron (ZOFRAN) 8 MG tablet Take 1  tablet (8 mg total) by mouth every 8 (eight) hours as needed for refractory nausea / vomiting. 30 tablet 1  . polyethylene glycol (MIRALAX / GLYCOLAX) 17 g packet Take 17 g by mouth 2 (two) times daily as needed.    . prochlorperazine (COMPAZINE) 10 MG tablet Take 1 tablet (10 mg total) by mouth every 6 (six) hours as needed (Nausea or vomiting). 30 tablet 1  . vitamin B-12 (CYANOCOBALAMIN) 500 MCG tablet Take 500 mcg by mouth daily.     No current facility-administered medications for this visit.     REVIEW OF SYSTEMS:   Constitutional: Denies fevers, chills or abnormal night sweats Eyes: Denies blurriness of vision, double vision or watery eyes Ears, nose, mouth, throat, and face: Denies mucositis or sore throat Respiratory: Denies cough, dyspnea or wheezes Cardiovascular: Denies palpitation, chest discomfort or lower extremity swelling Skin: Denies abnormal skin rashes Lymphatics: Denies new lymphadenopathy or easy bruising Neurological:Denies numbness, tingling or new weaknesses Behavioral/Psych: Mood is stable, no new changes  All other systems were reviewed with the patient and are negative.  PHYSICAL EXAMINATION: ECOG PERFORMANCE STATUS: 1 - Symptomatic but completely ambulatory  Vitals:   12/07/18 0850  BP: (!) 147/74  Pulse: 83  Resp: 18  Temp: 98.9 F (37.2 C)  SpO2: 97%   Filed Weights   12/07/18 0850  Weight: 215 lb (97.5 kg)    GENERAL:alert, no distress and comfortable SKIN: skin color, texture, turgor are normal, no rashes or significant lesions EYES: normal, conjunctiva are pink and non-injected, sclera clear OROPHARYNX:no exudate, no erythema and lips, buccal mucosa, and tongue normal  NECK: supple, thyroid normal size, non-tender, without nodularity LYMPH:  no palpable lymphadenopathy in the cervical, axillary or inguinal LUNGS: She has reduced breath sounds on lung bases with normal breathing effort  hEART: regular rate & rhythm and no murmurs and no  lower extremity edema ABDOMEN:abdomen soft, non-tender and normal bowel sounds.  It appears distended Musculoskeletal:no cyanosis of digits and no clubbing  PSYCH: alert & oriented x 3 with fluent speech NEURO: no focal motor/sensory deficits  LABORATORY DATA:  I have reviewed the data as listed Lab Results  Component Value Date   WBC 6.1 04/08/2013   HGB 13.7 04/08/2013   HCT 40.4 04/08/2013   MCV 91.2 04/08/2013   PLT 285 04/08/2013  Recent Labs    12/05/18 1234  NA 138  K 4.2  CL 104  CO2 24  GLUCOSE 89  BUN 19  CREATININE 0.90  CALCIUM 9.7  GFRNONAA >60  GFRAA >60    RADIOGRAPHIC STUDIES: I have personally reviewed the radiological images as listed and agreed with the findings in the report. Ct Abdomen Pelvis W Contrast  Result Date: 11/29/2018 CLINICAL DATA:  Abdominal and pelvic pain and bloating. Creatinine was obtained on site at [facility] at [address] Results: Creatinine 1.0 mg/dL. EXAM: CT ABDOMEN AND PELVIS WITH CONTRAST TECHNIQUE: Multidetector CT imaging of the abdomen and pelvis was performed using the standard protocol following bolus administration of intravenous contrast. CONTRAST:  1109mL ISOVUE-300 IOPAMIDOL (ISOVUE-300) INJECTION 61% COMPARISON:  None. FINDINGS: Lower Chest: Moderate right pleural effusion and tiny left pleural effusion. Mild right lower lobe compressive atelectasis. Mild lymphadenopathy in right cardiophrenic angle measuring up to 10 mm spurs 6 Hepatobiliary: No hepatic masses identified. Gallbladder is unremarkable. Pancreas:  No mass or inflammatory changes. Spleen: Within normal limits in size and appearance. Adrenals/Urinary Tract: No masses identified. No evidence of hydronephrosis. Stomach/Bowel: No evidence of obstruction, inflammatory process or abnormal fluid collections. Vascular/Lymphatic: Mild lymphadenopathy in the porta hepatis, with largest lymph node measuring 11 mm. No other pathologically enlarged lymph  nodes identified. Reproductive: Prior hysterectomy. Pessary noted in vagina. A cystic lesion with mild soft tissue thickening along the right lateral wall and a thin internal septation is seen in the central pelvis is, which measures 13.4 x 9.8 cm. Since mild ascites is seen in the pelvic cul-de-sac and perihepatic space is. This is is soft tissue caking is seen throughout the omentum, and multiple other tiny peritoneal soft tissue nodules are seen, consistent with peritoneal carcinomatosis. Other:  None. Musculoskeletal:  No suspicious bone lesions identified. IMPRESSION: 1. 13 cm complex cystic lesion in the central pelvis, highly suspicious for ovarian cystadenocarcinoma. 2. Diffuse peritoneal carcinomatosis with mild ascites. 3. Mild lymphadenopathy in porta hepatis and right cardiophrenic angle, suspicious for metastatic disease. 4. Moderate right and tiny left pleural effusions. These results will be called to the ordering clinician or representative by the Radiologist Assistant, and communication documented in the PACS or zVision Dashboard. Electronically Signed   By: Earle Gell M.D.   On: 11/29/2018 18:21    I spent 55 minutes counseling the patient face to face. The total time spent in the appointment was 80 minutes and more than 50% was on counseling.  All questions were answered. The patient knows to call the clinic with any problems, questions or concerns.  [name], MD 12/07/2018 11:10 AM

## 2018-12-07 NOTE — Assessment & Plan Note (Signed)
She is mildly symptomatic with shortness of breath She will undergo diagnostic and therapeutic thoracentesis on Friday.

## 2018-12-07 NOTE — Assessment & Plan Note (Signed)
Her symptoms of changes in bowel habit with constipation, bloating and sensation of acid reflux are related to carcinomatosis I recommend laxative therapy to avoid constipation.

## 2018-12-07 NOTE — Telephone Encounter (Signed)
Called Paula Peterson and advised her of port appointment on 12/15/18 with arrival at 8 am and NPO after midnight.  She verbalized understanding and agreement.

## 2018-12-08 ENCOUNTER — Telehealth: Payer: Self-pay | Admitting: Hematology and Oncology

## 2018-12-08 NOTE — Telephone Encounter (Signed)
I talk with patient regarding schedule  

## 2018-12-09 ENCOUNTER — Ambulatory Visit (HOSPITAL_COMMUNITY)
Admission: RE | Admit: 2018-12-09 | Discharge: 2018-12-09 | Disposition: A | Discharge status: Home / Self Care | Payer: Medicare Other | Source: Ambulatory Visit | Attending: Radiology | Admitting: Radiology

## 2018-12-09 ENCOUNTER — Ambulatory Visit (HOSPITAL_COMMUNITY): Admission: RE | Admit: 2018-12-09 | Payer: Medicare Other | Source: Ambulatory Visit

## 2018-12-09 ENCOUNTER — Telehealth: Payer: Self-pay | Admitting: *Deleted

## 2018-12-09 ENCOUNTER — Other Ambulatory Visit: Payer: Self-pay

## 2018-12-09 ENCOUNTER — Ambulatory Visit (HOSPITAL_COMMUNITY)
Admission: RE | Admit: 2018-12-09 | Discharge: 2018-12-09 | Disposition: A | Discharge status: Home / Self Care | Payer: Medicare Other | Source: Ambulatory Visit | Attending: Gynecologic Oncology | Admitting: Gynecologic Oncology

## 2018-12-09 DIAGNOSIS — Z8543 Personal history of malignant neoplasm of ovary: Secondary | ICD-10-CM | POA: Insufficient documentation

## 2018-12-09 DIAGNOSIS — C384 Malignant neoplasm of pleura: Secondary | ICD-10-CM | POA: Diagnosis not present

## 2018-12-09 DIAGNOSIS — J9811 Atelectasis: Secondary | ICD-10-CM | POA: Diagnosis not present

## 2018-12-09 DIAGNOSIS — J90 Pleural effusion, not elsewhere classified: Secondary | ICD-10-CM

## 2018-12-09 DIAGNOSIS — Z9889 Other specified postprocedural states: Secondary | ICD-10-CM

## 2018-12-09 MED ORDER — LIDOCAINE HCL 1 % IJ SOLN
INTRAMUSCULAR | Status: AC
  Filled 2018-12-09: qty 10

## 2018-12-09 NOTE — Procedures (Signed)
Ultrasound-guided diagnostic and therapeutic right thoracentesis performed yielding 1 liter of amber colored fluid. No immediate complications. Follow-up chest x-ray pending. The fluid was sent to the lab for cytology. EBL none.

## 2018-12-09 NOTE — Telephone Encounter (Signed)
A staff member from radiology called to check on the patient's appts. Patient thought she had an appt for today with Dr. Denman George. Explained that we would call her with the results and gave the scheduled appts. Staff member stated that she was confused, but would call and give the granddaughter the appts.

## 2018-12-12 ENCOUNTER — Other Ambulatory Visit: Payer: Self-pay

## 2018-12-12 ENCOUNTER — Ambulatory Visit (HOSPITAL_COMMUNITY)
Admission: RE | Admit: 2018-12-12 | Discharge: 2018-12-12 | Disposition: A | Discharge status: Home / Self Care | Payer: Medicare Other | Source: Ambulatory Visit | Attending: Gynecologic Oncology | Admitting: Gynecologic Oncology

## 2018-12-12 DIAGNOSIS — J90 Pleural effusion, not elsewhere classified: Secondary | ICD-10-CM | POA: Diagnosis not present

## 2018-12-12 MED ORDER — IOHEXOL 300 MG/ML  SOLN
75.0000 mL | Freq: Once | INTRAMUSCULAR | Status: AC | PRN
  Administered 2018-12-12: 75 mL via INTRAVENOUS

## 2018-12-12 MED ORDER — SODIUM CHLORIDE (PF) 0.9 % IJ SOLN
INTRAMUSCULAR | Status: AC
  Filled 2018-12-12: qty 50

## 2018-12-13 ENCOUNTER — Telehealth: Payer: Self-pay | Admitting: Oncology

## 2018-12-13 NOTE — Telephone Encounter (Signed)
Called IR and canceled CT Biopsy with Tiffany.  Also requested CBC p diff and CMP to be done when port is placed.  Called Clarise Cruz and advised her that the CT biopsy has been canceled per Dr. Alvy Bimler since her thoracentesis showed a gyn primary cancer.  Discussed that she will still need port placement on 12/15/18 and she knows to arrive at 8 am.

## 2018-12-14 ENCOUNTER — Other Ambulatory Visit: Payer: Self-pay | Admitting: Radiology

## 2018-12-14 ENCOUNTER — Encounter: Payer: Self-pay | Admitting: Radiology

## 2018-12-15 ENCOUNTER — Ambulatory Visit (HOSPITAL_COMMUNITY)
Admission: RE | Admit: 2018-12-15 | Discharge: 2018-12-15 | Disposition: A | Discharge status: Home / Self Care | Payer: Medicare Other | Source: Ambulatory Visit | Attending: Hematology and Oncology | Admitting: Hematology and Oncology

## 2018-12-15 ENCOUNTER — Encounter (HOSPITAL_COMMUNITY): Payer: Self-pay

## 2018-12-15 ENCOUNTER — Ambulatory Visit (HOSPITAL_COMMUNITY): Payer: Medicare Other

## 2018-12-15 ENCOUNTER — Other Ambulatory Visit: Payer: Self-pay

## 2018-12-15 ENCOUNTER — Ambulatory Visit (HOSPITAL_COMMUNITY)
Admission: RE | Admit: 2018-12-15 | Discharge: 2018-12-15 | Disposition: A | Discharge status: Home / Self Care | Payer: Medicare Other | Source: Ambulatory Visit | Attending: Gynecologic Oncology | Admitting: Gynecologic Oncology

## 2018-12-15 DIAGNOSIS — Z7982 Long term (current) use of aspirin: Secondary | ICD-10-CM | POA: Insufficient documentation

## 2018-12-15 DIAGNOSIS — C569 Malignant neoplasm of unspecified ovary: Secondary | ICD-10-CM | POA: Insufficient documentation

## 2018-12-15 DIAGNOSIS — I10 Essential (primary) hypertension: Secondary | ICD-10-CM | POA: Diagnosis not present

## 2018-12-15 DIAGNOSIS — K219 Gastro-esophageal reflux disease without esophagitis: Secondary | ICD-10-CM | POA: Insufficient documentation

## 2018-12-15 DIAGNOSIS — Z90710 Acquired absence of both cervix and uterus: Secondary | ICD-10-CM | POA: Insufficient documentation

## 2018-12-15 DIAGNOSIS — C799 Secondary malignant neoplasm of unspecified site: Secondary | ICD-10-CM | POA: Diagnosis not present

## 2018-12-15 DIAGNOSIS — Z79899 Other long term (current) drug therapy: Secondary | ICD-10-CM | POA: Insufficient documentation

## 2018-12-15 DIAGNOSIS — Z452 Encounter for adjustment and management of vascular access device: Secondary | ICD-10-CM | POA: Diagnosis not present

## 2018-12-15 DIAGNOSIS — Z8673 Personal history of transient ischemic attack (TIA), and cerebral infarction without residual deficits: Secondary | ICD-10-CM | POA: Diagnosis not present

## 2018-12-15 DIAGNOSIS — Z5111 Encounter for antineoplastic chemotherapy: Secondary | ICD-10-CM | POA: Diagnosis not present

## 2018-12-15 HISTORY — PX: IR IMAGING GUIDED PORT INSERTION: IMG5740

## 2018-12-15 LAB — COMPREHENSIVE METABOLIC PANEL
ALT: 16 U/L (ref 0–44)
AST: 16 U/L (ref 15–41)
Albumin: 3.5 g/dL (ref 3.5–5.0)
Alkaline Phosphatase: 76 U/L (ref 38–126)
Anion gap: 9 (ref 5–15)
BUN: 23 mg/dL (ref 8–23)
CO2: 26 mmol/L (ref 22–32)
Calcium: 9.6 mg/dL (ref 8.9–10.3)
Chloride: 104 mmol/L (ref 98–111)
Creatinine, Ser: 0.89 mg/dL (ref 0.44–1.00)
GFR calc Af Amer: >60 mL/min (ref >60)
GFR calc non Af Amer: >60 mL/min (ref >60)
Glucose, Bld: 104 mg/dL — ABNORMAL HIGH (ref 70–99)
Potassium: 4.2 mmol/L (ref 3.5–5.1)
Sodium: 139 mmol/L (ref 135–145)
Total Bilirubin: 0.3 mg/dL (ref 0.3–1.2)
Total Protein: 7.3 g/dL (ref 6.5–8.1)

## 2018-12-15 LAB — CBC WITH DIFFERENTIAL/PLATELET
Abs Immature Granulocytes: 0.01 10*3/uL (ref 0.00–0.07)
Basophils Absolute: 0 10*3/uL (ref 0.0–0.1)
Basophils Relative: 1 %
Eosinophils Absolute: 0.1 10*3/uL (ref 0.0–0.5)
Eosinophils Relative: 2 %
HCT: 41.1 % (ref 36.0–46.0)
Hemoglobin: 13.4 g/dL (ref 12.0–15.0)
Immature Granulocytes: 0 %
Lymphocytes Relative: 28 %
Lymphs Abs: 1.6 10*3/uL (ref 0.7–4.0)
MCH: 31.2 pg (ref 26.0–34.0)
MCHC: 32.6 g/dL (ref 30.0–36.0)
MCV: 95.6 fL (ref 80.0–100.0)
Monocytes Absolute: 0.6 10*3/uL (ref 0.1–1.0)
Monocytes Relative: 11 %
Neutro Abs: 3.5 10*3/uL (ref 1.7–7.7)
Neutrophils Relative %: 58 %
Platelets: 354 10*3/uL (ref 150–400)
RBC: 4.3 MIL/uL (ref 3.87–5.11)
RDW: 12.5 % (ref 11.5–15.5)
WBC: 5.9 10*3/uL (ref 4.0–10.5)
nRBC: 0 % (ref 0.0–0.2)

## 2018-12-15 LAB — PROTIME-INR
INR: 0.9 (ref 0.8–1.2)
Prothrombin Time: 12.5 seconds (ref 11.4–15.2)

## 2018-12-15 LAB — APTT: aPTT: 33 seconds (ref 24–36)

## 2018-12-15 MED ORDER — FENTANYL CITRATE (PF) 100 MCG/2ML IJ SOLN
INTRAMUSCULAR | Status: AC
  Filled 2018-12-15: qty 2

## 2018-12-15 MED ORDER — LIDOCAINE HCL (PF) 1 % IJ SOLN
INTRAMUSCULAR | Status: AC | PRN
  Administered 2018-12-15: 10 mL

## 2018-12-15 MED ORDER — MIDAZOLAM HCL 2 MG/2ML IJ SOLN
INTRAMUSCULAR | Status: AC
  Filled 2018-12-15: qty 4

## 2018-12-15 MED ORDER — LIDOCAINE HCL (PF) 1 % IJ SOLN
INTRAMUSCULAR | Status: AC | PRN
  Administered 2018-12-15: 5 mL

## 2018-12-15 MED ORDER — SODIUM CHLORIDE 0.9 % IV SOLN
INTRAVENOUS | Status: DC
  Administered 2018-12-15: 09:00:00 via INTRAVENOUS

## 2018-12-15 MED ORDER — MIDAZOLAM HCL 2 MG/2ML IJ SOLN
INTRAMUSCULAR | Status: AC | PRN
  Administered 2018-12-15 (×2): 1 mg via INTRAVENOUS

## 2018-12-15 MED ORDER — CEFAZOLIN SODIUM-DEXTROSE 2-4 GM/100ML-% IV SOLN
2.0000 g | INTRAVENOUS | Status: AC
  Administered 2018-12-15: 2 g via INTRAVENOUS

## 2018-12-15 MED ORDER — HEPARIN SOD (PORK) LOCK FLUSH 100 UNIT/ML IV SOLN
INTRAVENOUS | Status: AC
  Filled 2018-12-15: qty 5

## 2018-12-15 MED ORDER — LIDOCAINE HCL 1 % IJ SOLN
INTRAMUSCULAR | Status: AC
  Filled 2018-12-15: qty 20

## 2018-12-15 MED ORDER — HEPARIN SOD (PORK) LOCK FLUSH 100 UNIT/ML IV SOLN
INTRAVENOUS | Status: AC | PRN
  Administered 2018-12-15: 500 [IU] via INTRAVENOUS

## 2018-12-15 MED ORDER — FENTANYL CITRATE (PF) 100 MCG/2ML IJ SOLN
INTRAMUSCULAR | Status: AC | PRN
  Administered 2018-12-15 (×2): 50 ug via INTRAVENOUS

## 2018-12-15 MED ORDER — CEFAZOLIN SODIUM-DEXTROSE 2-4 GM/100ML-% IV SOLN
INTRAVENOUS | Status: AC
  Administered 2018-12-15: 11:00:00 2 g via INTRAVENOUS
  Filled 2018-12-15: qty 100

## 2018-12-15 NOTE — H&P (Signed)
Referring Physician(s): [name]  Supervising Physician: Aletta Edouard  Patient Status:  Paula Peterson OP  Chief Complaint: "I'm here to get a port a cath"   Subjective: Patient familiar to IR service from prior right thoracentesis on 12/09/2018.  She has a history of recently diagnosed metastatic ovarian cancer and presents today for Port-A-Cath placement for chemotherapy.  She currently denies fever, headache, chest pain, nausea, vomiting or bleeding.  She does have occasional cough , some dyspnea with exertion, intermittent abdominal and back discomfort as well as constipation.  Past Medical History:  Diagnosis Date  . Back pain   . GERD (gastroesophageal reflux disease)   . Hypertension   . Pessary maintenance   . Stroke [facility])    Past Surgical History:  Procedure Laterality Date  . ABDOMINAL HYSTERECTOMY    . BACK SURGERY    . FRACTURE SURGERY        Allergies: Patient has no known allergies.  Medications: Prior to Admission medications   Medication Sig Start Date End Date Taking? Authorizing Provider  aspirin EC 81 MG tablet Take 81 mg by mouth daily.   Yes [provider]  esomeprazole (NEXIUM) 40 MG capsule Take 40 mg by mouth daily at 12 noon.   Yes [provider]  ibuprofen (ADVIL) 200 MG tablet Take 200-400 mg by mouth every 6 (six) hours as needed.   Yes [provider]  losartan (COZAAR) 50 MG tablet Take 50 mg by mouth daily.   Yes [provider]  cetirizine (ZYRTEC) 10 MG tablet Take 10 mg by mouth daily.    [provider]  Coenzyme Q10 (CO Q 10 PO) Take 200 mg by mouth daily.    [provider]  dexamethasone (DECADRON) 4 MG tablet Take 2 tabs at the night before and 2 tab the morning of chemotherapy, every 3 weeks, by mouth with food 12/07/18   [name], MD  estradiol (ESTRACE) 1 MG tablet Take 1 mg by mouth daily.    [provider]  ESTRADIOL ACETATE PO Take 20 mg by mouth daily.     [provider]  lidocaine-prilocaine (EMLA) cream Apply to affected area once 12/07/18   [name], MD  omeprazole (PRILOSEC) 20 MG capsule Take 20 mg by mouth daily.    [provider]  ondansetron (ZOFRAN) 8 MG tablet Take 1 tablet (8 mg total) by mouth every 8 (eight) hours as needed for refractory nausea / vomiting. 12/07/18   [name], MD  polyethylene glycol (MIRALAX / GLYCOLAX) 17 g packet Take 17 g by mouth 2 (two) times daily as needed.    [provider]  prochlorperazine (COMPAZINE) 10 MG tablet Take 1 tablet (10 mg total) by mouth every 6 (six) hours as needed (Nausea or vomiting). 12/07/18   [name], MD  vitamin B-12 (CYANOCOBALAMIN) 500 MCG tablet Take 500 mcg by mouth daily.    [provider]     Vital Signs: BP (!) 146/81   Pulse 75   Temp 98.4 F (36.9 C) (Oral)   Resp 16   SpO2 98%   Physical Exam awake, alert.  Chest with diminished breath sounds at bases.  Heart with regular rate and rhythm.  Abdomen soft, slightly distended, mild diffuse tenderness, positive bowel sounds; no lower extremity edema  Imaging: Ct Chest W Contrast  Result Date: 12/12/2018 CLINICAL DATA:  Evaluate pleural effusions. History of ovarian cancer. EXAM: CT CHEST WITH CONTRAST TECHNIQUE: Multidetector CT imaging of the chest  was performed during intravenous contrast administration. CONTRAST:  58mL OMNIPAQUE IOHEXOL 300 MG/ML  SOLN COMPARISON:  None FINDINGS: Cardiovascular: Heart size appears within normal limits. No pericardial effusion identified. Aortic atherosclerosis. Mediastinum/Nodes: Normal appearance of the thyroid gland. The trachea appears patent and is midline. Normal appearance of the esophagus. No mediastinal, or hilar adenopathy. Lymph node at the right CP angle is unchanged measuring 1.1 cm, image 107/2. There is an adjacent soft tissue mass which extends to the pleural surface overlying the right lung anteriorly. Two adjacent lymph nodes are  noted which extend to the pleural surface overlying the right middle lobe with adjacent pleural thickening and enhancement, image 112/2. Index node measures 1.1 by 2.0 cm, image 108/2. Previously 1.1 x 0.8 cm. Lungs/Pleura: Small left pleural effusion and moderate right pleural effusion identified. When compared with the previous exam the right pleural effusion is slightly decreased in volume in the left pleural effusion is slightly increased in volume. There is associated overlying compressive type atelectasis. Small subpleural nodule within the posterior right upper lobe measures 8 mm, image 39/7. Tiny perifissural nodules are identified on the left extending along the oblique fissure. Upper Abdomen: Upper abdominal ascites is identified along with peritoneal nodularity. Soft tissue nodule within the left upper quadrant of the abdomen measures 1.8 cm, image 140/2. Previously this measured the same. Musculoskeletal: No aggressive lytic or sclerotic bone lesions. IMPRESSION: 1. Moderate right pleural effusion and small left pleural effusion. When compared with the previous exam there is progressive soft tissue nodularity/adenopathy within the right CP angle. This extends to the pleural surface overlying the right middle lobe and is concerning for pleural involvement by tumor. Recommend further evaluation with diagnostic thoracentesis. 2. Mild bilateral pleural nodularity is identified. The appearance is nonspecific. Certainly this could be seen with pleural spread of disease. 3. Abdominal ascites with peritoneal nodularity. Index peritoneal nodule in the left upper quadrant is unchanged from previous exam. 4.  Aortic Atherosclerosis (ICD10-I70.0). Electronically Signed   By: Kerby Moors M.D.   On: 12/12/2018 15:25    Labs:  CBC: No results for input(s): WBC, HGB, HCT, PLT in the last 8760 hours.  COAGS: No results for input(s): INR, APTT in the last 8760 hours.  BMP: Recent Labs    12/05/18 1234   NA 138  K 4.2  CL 104  CO2 24  GLUCOSE 89  BUN 19  CALCIUM 9.7  CREATININE 0.90  GFRNONAA >60  GFRAA >60    LIVER FUNCTION TESTS: No results for input(s): BILITOT, AST, ALT, ALKPHOS, PROT, ALBUMIN in the last 8760 hours.  Assessment and Plan: Pt with history of recently diagnosed metastatic ovarian cancer ; presents today for Port-A-Cath placement for chemotherapy.Risks and benefits of image guided port-a-catheter placement was discussed with the patient/granddaughter including, but not limited to bleeding, infection, pneumothorax, or fibrin sheath development and need for additional procedures.  All of the patient's questions were answered, patient is agreeable to proceed. Consent signed and in chart.     Electronically Signed: D. Rowe Robert, PA-C 12/15/2018, 8:49 AM   I spent a total of 25 minutes at the the patient's bedside AND on the patient's hospital floor or unit, greater than 50% of which was counseling/coordinating care for Port-A-Cath placement

## 2018-12-15 NOTE — Procedures (Signed)
Interventional Radiology Procedure Note  Procedure: Single Lumen Power Port Placement    Access:  Right IJ vein.  Findings: Catheter tip positioned at SVC/RA junction. Port is ready for immediate use.   Complications: None  EBL: < 10 mL  Recommendations:  - Ok to shower in 24 hours - Do not submerge for 7 days - Routine line care   Laurana Magistro T. Abbegayle Denault, M.D Pager:  [phone]   

## 2018-12-15 NOTE — Discharge Instructions (Signed)
Do not use EMLA cream on your new port. EMLA cream will dissolve the skin glue over your new port and the skin will come apart resulting in infection. Wait until the cancer center nurses tell you that your port has healed and you can use the EMLA cream. Use ice in a zip lock bag over your port for your appointment tomorrow before they access the port.   Leave your dressing on until you go to the [facility] for your appointment tomorrow.   Implanted Port Insertion, Care After This sheet gives you information about how to care for yourself after your procedure. Your health care provider may also give you more specific instructions. If you have problems or questions, contact your health care provider. What can I expect after the procedure? After the procedure, it is common to have:  Discomfort at the port insertion site.  Bruising on the skin over the port. This should improve over 3-4 days. Follow these instructions at home: [facility] care  After your port is placed, you will get a manufacturer's information card. The card has information about your port. Keep this card with you at all times.  Take care of the port as told by your health care provider. Ask your health care provider if you or a family member can get training for taking care of the port at home. A home health care nurse may also take care of the port.  Make sure to remember what type of port you have. Incision care      Follow instructions from your health care provider about how to take care of your port insertion site. Make sure you: ? Wash your hands with soap and water before and after you change your bandage (dressing). If soap and water are not available, use hand sanitizer. ? Change your dressing as told by your health care provider. ? Leave stitches (sutures), skin glue, or adhesive strips in place. These skin closures may need to stay in place for 2 weeks or longer. If adhesive strip edges start to loosen and curl  up, you may trim the loose edges. Do not remove adhesive strips completely unless your health care provider tells you to do that.  Check your port insertion site every day for signs of infection. Check for: ? Redness, swelling, or pain. ? Fluid or blood. ? Warmth. ? Pus or a bad smell. Activity  Return to your normal activities as told by your health care provider. Ask your health care provider what activities are safe for you.  Do not lift anything that is heavier than 10 lb (4.5 kg), or the limit that you are told, until your health care provider says that it is safe. General instructions  Take over-the-counter and prescription medicines only as told by your health care provider.  Do not take baths, swim, or use a hot tub until your health care provider approves. Ask your health care provider if you may take showers. You may only be allowed to take sponge baths.  Do not drive for 24 hours if you were given a sedative during your procedure.  Wear a medical alert bracelet in case of an emergency. This will tell any health care providers that you have a port.  Keep all follow-up visits as told by your health care provider. This is important. Contact a health care provider if:  You cannot flush your port with saline as directed, or you cannot draw blood from the port.  You have a fever  or chills.  You have redness, swelling, or pain around your port insertion site.  You have fluid or blood coming from your port insertion site.  Your port insertion site feels warm to the touch.  You have pus or a bad smell coming from the port insertion site. Get help right away if:  You have chest pain or shortness of breath.  You have bleeding from your port that you cannot control. Summary  Take care of the port as told by your health care provider. Keep the manufacturer's information card with you at all times.  Change your dressing as told by your health care provider.  Contact a  health care provider if you have a fever or chills or if you have redness, swelling, or pain around your port insertion site.  Keep all follow-up visits as told by your health care provider. This information is not intended to replace advice given to you by your health care provider. Make sure you discuss any questions you have with your health care provider. Document Released: 04/05/2013 Document Revised: 01/11/2018 Document Reviewed: 01/11/2018 Elsevier Interactive Patient Education  2019 [place].    Moderate Conscious Sedation, Adult, Care After These instructions provide you with information about caring for yourself after your procedure. Your health care provider may also give you more specific instructions. Your treatment has been planned according to current medical practices, but problems sometimes occur. Call your health care provider if you have any problems or questions after your procedure. What can I expect after the procedure? After your procedure, it is common:  To feel sleepy for several hours.  To feel clumsy and have poor balance for several hours.  To have poor judgment for several hours.  To vomit if you eat too soon. Follow these instructions at home: For at least 24 hours after the procedure:   Do not: ? Participate in activities where you could fall or become injured. ? Drive. ? Use heavy machinery. ? Drink alcohol. ? Take sleeping pills or medicines that cause drowsiness. ? Make important decisions or sign legal documents. ? Take care of children on your own.  Rest. Eating and drinking  Follow the diet recommended by your health care provider.  If you vomit: ? Drink water, juice, or soup when you can drink without vomiting. ? Make sure you have little or no nausea before eating solid foods. General instructions  Have a responsible adult stay with you until you are awake and alert.  Take over-the-counter and prescription medicines only as  told by your health care provider.  If you smoke, do not smoke without supervision.  Keep all follow-up visits as told by your health care provider. This is important. Contact a health care provider if:  You keep feeling nauseous or you keep vomiting.  You feel light-headed.  You develop a rash.  You have a fever. Get help right away if:  You have trouble breathing. This information is not intended to replace advice given to you by your health care provider. Make sure you discuss any questions you have with your health care provider. Document Released: 04/05/2013 Document Revised: 11/18/2015 Document Reviewed: 10/05/2015 Elsevier Interactive Patient Education  2019 [employer].

## 2018-12-16 ENCOUNTER — Inpatient Hospital Stay: Payer: Medicare Other

## 2018-12-16 ENCOUNTER — Other Ambulatory Visit: Payer: Self-pay

## 2018-12-16 ENCOUNTER — Telehealth: Payer: Self-pay | Admitting: Hematology and Oncology

## 2018-12-16 VITALS — BP 119/59 | HR 80 | Temp 98.5°F | Resp 18 | Wt 215.5 lb

## 2018-12-16 DIAGNOSIS — C786 Secondary malignant neoplasm of retroperitoneum and peritoneum: Secondary | ICD-10-CM | POA: Diagnosis not present

## 2018-12-16 DIAGNOSIS — C569 Malignant neoplasm of unspecified ovary: Secondary | ICD-10-CM

## 2018-12-16 DIAGNOSIS — K219 Gastro-esophageal reflux disease without esophagitis: Secondary | ICD-10-CM | POA: Diagnosis not present

## 2018-12-16 DIAGNOSIS — C800 Disseminated malignant neoplasm, unspecified: Secondary | ICD-10-CM | POA: Diagnosis not present

## 2018-12-16 DIAGNOSIS — R140 Abdominal distension (gaseous): Secondary | ICD-10-CM | POA: Diagnosis not present

## 2018-12-16 DIAGNOSIS — J90 Pleural effusion, not elsewhere classified: Secondary | ICD-10-CM | POA: Diagnosis not present

## 2018-12-16 DIAGNOSIS — R971 Elevated cancer antigen 125 [CA 125]: Secondary | ICD-10-CM | POA: Diagnosis not present

## 2018-12-16 DIAGNOSIS — R6881 Early satiety: Secondary | ICD-10-CM | POA: Diagnosis not present

## 2018-12-16 DIAGNOSIS — C772 Secondary and unspecified malignant neoplasm of intra-abdominal lymph nodes: Secondary | ICD-10-CM | POA: Diagnosis not present

## 2018-12-16 MED ORDER — HEPARIN SOD (PORK) LOCK FLUSH 100 UNIT/ML IV SOLN
500.0000 [IU] | Freq: Once | INTRAVENOUS | Status: AC | PRN
  Administered 2018-12-16: 500 [IU]
  Filled 2018-12-16: qty 5

## 2018-12-16 MED ORDER — SODIUM CHLORIDE 0.9 % IV SOLN
Freq: Once | INTRAVENOUS | Status: AC
  Administered 2018-12-16: 10:00:00 via INTRAVENOUS
  Filled 2018-12-16: qty 250

## 2018-12-16 MED ORDER — DIPHENHYDRAMINE HCL 50 MG/ML IJ SOLN
INTRAMUSCULAR | Status: AC
  Filled 2018-12-16: qty 1

## 2018-12-16 MED ORDER — SODIUM CHLORIDE 0.9 % IV SOLN
596.4000 mg | Freq: Once | INTRAVENOUS | Status: AC
  Administered 2018-12-16: 600 mg via INTRAVENOUS
  Filled 2018-12-16: qty 60

## 2018-12-16 MED ORDER — SODIUM CHLORIDE 0.9 % IV SOLN
Freq: Once | INTRAVENOUS | Status: AC
  Administered 2018-12-16: 10:00:00 via INTRAVENOUS
  Filled 2018-12-16: qty 5

## 2018-12-16 MED ORDER — FAMOTIDINE IN NACL 20-0.9 MG/50ML-% IV SOLN
20.0000 mg | Freq: Once | INTRAVENOUS | Status: AC
  Administered 2018-12-16: 20 mg via INTRAVENOUS

## 2018-12-16 MED ORDER — SODIUM CHLORIDE 0.9 % IV SOLN
140.0000 mg/m2 | Freq: Once | INTRAVENOUS | Status: AC
  Administered 2018-12-16: 300 mg via INTRAVENOUS
  Filled 2018-12-16: qty 50

## 2018-12-16 MED ORDER — SODIUM CHLORIDE 0.9% FLUSH
10.0000 mL | INTRAVENOUS | Status: DC | PRN
  Administered 2018-12-16: 10 mL
  Filled 2018-12-16: qty 10

## 2018-12-16 MED ORDER — PALONOSETRON HCL INJECTION 0.25 MG/5ML
INTRAVENOUS | Status: AC
  Filled 2018-12-16: qty 5

## 2018-12-16 MED ORDER — PALONOSETRON HCL INJECTION 0.25 MG/5ML
0.2500 mg | Freq: Once | INTRAVENOUS | Status: AC
  Administered 2018-12-16: 0.25 mg via INTRAVENOUS

## 2018-12-16 MED ORDER — DIPHENHYDRAMINE HCL 50 MG/ML IJ SOLN
50.0000 mg | Freq: Once | INTRAMUSCULAR | Status: AC
  Administered 2018-12-16: 50 mg via INTRAVENOUS

## 2018-12-16 MED ORDER — FAMOTIDINE IN NACL 20-0.9 MG/50ML-% IV SOLN
INTRAVENOUS | Status: AC
  Filled 2018-12-16: qty 50

## 2018-12-16 NOTE — Progress Notes (Signed)
Stopped by infusion room to check on pt & see if she had any questions/concerns post pt education.  She denies any & feels well prepared.

## 2018-12-16 NOTE — Telephone Encounter (Signed)
Left message re next appointment 7/10. Schedule mailed.

## 2018-12-16 NOTE — Patient Instructions (Signed)
[place] Discharge Instructions for Patients Receiving Chemotherapy  Today you received the following chemotherapy agents taxol and carboplatin  To help prevent nausea and vomiting after your treatment, we encourage you to take your nausea medication as directed.   If you develop nausea and vomiting that is not controlled by your nausea medication, call the clinic.   BELOW ARE SYMPTOMS THAT SHOULD BE REPORTED IMMEDIATELY:  *FEVER GREATER THAN 100.5 F  *CHILLS WITH OR WITHOUT FEVER  NAUSEA AND VOMITING THAT IS NOT CONTROLLED WITH YOUR NAUSEA MEDICATION  *UNUSUAL SHORTNESS OF BREATH  *UNUSUAL BRUISING OR BLEEDING  TENDERNESS IN MOUTH AND THROAT WITH OR WITHOUT PRESENCE OF ULCERS  *URINARY PROBLEMS  *BOWEL PROBLEMS  UNUSUAL RASH Items with * indicate a potential emergency and should be followed up as soon as possible.  Feel free to call the clinic should you have any questions or concerns. The clinic phone number is [phone].  Please show the [facility] at check-in to the Emergency Department and triage nurse.  Paclitaxel injection (taxol) What is this medicine? PACLITAXEL (PAK li TAX el) is a chemotherapy drug. It targets fast dividing cells, like cancer cells, and causes these cells to die. This medicine is used to treat ovarian cancer, breast cancer, lung cancer, Kaposi's sarcoma, and other cancers. This medicine may be used for other purposes; ask your health care provider or pharmacist if you have questions. COMMON BRAND NAME(S): Onxol, Taxol What should I tell my health care provider before I take this medicine? They need to know if you have any of these conditions: -history of irregular heartbeat -liver disease -low blood counts, like low white cell, platelet, or red cell counts -lung or breathing disease, like asthma -tingling of the fingers or toes, or other nerve disorder -an unusual or allergic reaction to paclitaxel, alcohol,  polyoxyethylated castor oil, other chemotherapy, other medicines, foods, dyes, or preservatives -pregnant or trying to get pregnant -breast-feeding How should I use this medicine? This drug is given as an infusion into a vein. It is administered in a hospital or clinic by a specially trained health care professional. Talk to your pediatrician regarding the use of this medicine in children. Special care may be needed. Overdosage: If you think you have taken too much of this medicine contact a poison control center or emergency room at once. NOTE: This medicine is only for you. Do not share this medicine with others. What if I miss a dose? It is important not to miss your dose. Call your doctor or health care professional if you are unable to keep an appointment. What may interact with this medicine? Do not take this medicine with any of the following medications: -disulfiram -metronidazole This medicine may also interact with the following medications: -antiviral medicines for hepatitis, HIV or AIDS -certain antibiotics like erythromycin and clarithromycin -certain medicines for fungal infections like ketoconazole and itraconazole -certain medicines for seizures like carbamazepine, phenobarbital, phenytoin -gemfibrozil -nefazodone -rifampin -St. John's wort This list may not describe all possible interactions. Give your health care provider a list of all the medicines, herbs, non-prescription drugs, or dietary supplements you use. Also tell them if you smoke, drink alcohol, or use illegal drugs. Some items may interact with your medicine. What should I watch for while using this medicine? Your condition will be monitored carefully while you are receiving this medicine. You will need important blood work done while you are taking this medicine. This medicine can cause serious allergic reactions. To  reduce your risk you will need to take other medicine(s) before treatment with this medicine.  If you experience allergic reactions like skin rash, itching or hives, swelling of the face, lips, or tongue, tell your doctor or health care professional right away. In some cases, you may be given additional medicines to help with side effects. Follow all directions for their use. This drug may make you feel generally unwell. This is not uncommon, as chemotherapy can affect healthy cells as well as cancer cells. Report any side effects. Continue your course of treatment even though you feel ill unless your doctor tells you to stop. Call your doctor or health care professional for advice if you get a fever, chills or sore throat, or other symptoms of a cold or flu. Do not treat yourself. This drug decreases your body's ability to fight infections. Try to avoid being around people who are sick. This medicine may increase your risk to bruise or bleed. Call your doctor or health care professional if you notice any unusual bleeding. Be careful brushing and flossing your teeth or using a toothpick because you may get an infection or bleed more easily. If you have any dental work done, tell your dentist you are receiving this medicine. Avoid taking products that contain aspirin, acetaminophen, ibuprofen, naproxen, or ketoprofen unless instructed by your doctor. These medicines may hide a fever. Do not become pregnant while taking this medicine. Women should inform their doctor if they wish to become pregnant or think they might be pregnant. There is a potential for serious side effects to an unborn child. Talk to your health care professional or pharmacist for more information. Do not breast-feed an infant while taking this medicine. Men are advised not to father a child while receiving this medicine. This product may contain alcohol. Ask your pharmacist or healthcare provider if this medicine contains alcohol. Be sure to tell all healthcare providers you are taking this medicine. Certain medicines, like  metronidazole and disulfiram, can cause an unpleasant reaction when taken with alcohol. The reaction includes flushing, headache, nausea, vomiting, sweating, and increased thirst. The reaction can last from 30 minutes to several hours. What side effects may I notice from receiving this medicine? Side effects that you should report to your doctor or health care professional as soon as possible: -allergic reactions like skin rash, itching or hives, swelling of the face, lips, or tongue -breathing problems -changes in vision -fast, irregular heartbeat -high or low blood pressure -mouth sores -pain, tingling, numbness in the hands or feet -signs of decreased platelets or bleeding - bruising, pinpoint red spots on the skin, black, tarry stools, blood in the urine -signs of decreased red blood cells - unusually weak or tired, feeling faint or lightheaded, falls -signs of infection - fever or chills, cough, sore throat, pain or difficulty passing urine -signs and symptoms of liver injury like dark yellow or brown urine; general ill feeling or flu-like symptoms; light-colored stools; loss of appetite; nausea; right upper belly pain; unusually weak or tired; yellowing of the eyes or skin -swelling of the ankles, feet, hands -unusually slow heartbeat Side effects that usually do not require medical attention (report to your doctor or health care professional if they continue or are bothersome): -diarrhea -hair loss -loss of appetite -muscle or joint pain -nausea, vomiting -pain, redness, or irritation at site where injected -tiredness This list may not describe all possible side effects. Call your doctor for medical advice about side effects. You may report side  effects to FDA at 1-800-FDA-1088. Where should I keep my medicine? This drug is given in a hospital or clinic and will not be stored at home. NOTE: This sheet is a summary. It may not cover all possible information. If you have questions  about this medicine, talk to your doctor, pharmacist, or health care provider.  2019 Elsevier/Gold Standard (2017-02-16 13:14:55)  Carboplatin injection What is this medicine? CARBOPLATIN (KAR boe pla tin) is a chemotherapy drug. It targets fast dividing cells, like cancer cells, and causes these cells to die. This medicine is used to treat ovarian cancer and many other cancers. This medicine may be used for other purposes; ask your health care provider or pharmacist if you have questions. COMMON BRAND NAME(S): Paraplatin What should I tell my health care provider before I take this medicine? They need to know if you have any of these conditions: -blood disorders -hearing problems -kidney disease -recent or ongoing radiation therapy -an unusual or allergic reaction to carboplatin, cisplatin, other chemotherapy, other medicines, foods, dyes, or preservatives -pregnant or trying to get pregnant -breast-feeding How should I use this medicine? This drug is usually given as an infusion into a vein. It is administered in a hospital or clinic by a specially trained health care professional. Talk to your pediatrician regarding the use of this medicine in children. Special care may be needed. Overdosage: If you think you have taken too much of this medicine contact a poison control center or emergency room at once. NOTE: This medicine is only for you. Do not share this medicine with others. What if I miss a dose? It is important not to miss a dose. Call your doctor or health care professional if you are unable to keep an appointment. What may interact with this medicine? -medicines for seizures -medicines to increase blood counts like filgrastim, pegfilgrastim, sargramostim -some antibiotics like amikacin, gentamicin, neomycin, streptomycin, tobramycin -vaccines Talk to your doctor or health care professional before taking any of these  medicines: -acetaminophen -aspirin -ibuprofen -ketoprofen -naproxen This list may not describe all possible interactions. Give your health care provider a list of all the medicines, herbs, non-prescription drugs, or dietary supplements you use. Also tell them if you smoke, drink alcohol, or use illegal drugs. Some items may interact with your medicine. What should I watch for while using this medicine? Your condition will be monitored carefully while you are receiving this medicine. You will need important blood work done while you are taking this medicine. This drug may make you feel generally unwell. This is not uncommon, as chemotherapy can affect healthy cells as well as cancer cells. Report any side effects. Continue your course of treatment even though you feel ill unless your doctor tells you to stop. In some cases, you may be given additional medicines to help with side effects. Follow all directions for their use. Call your doctor or health care professional for advice if you get a fever, chills or sore throat, or other symptoms of a cold or flu. Do not treat yourself. This drug decreases your body's ability to fight infections. Try to avoid being around people who are sick. This medicine may increase your risk to bruise or bleed. Call your doctor or health care professional if you notice any unusual bleeding. Be careful brushing and flossing your teeth or using a toothpick because you may get an infection or bleed more easily. If you have any dental work done, tell your dentist you are receiving this medicine. Avoid taking  products that contain aspirin, acetaminophen, ibuprofen, naproxen, or ketoprofen unless instructed by your doctor. These medicines may hide a fever. Do not become pregnant while taking this medicine. Women should inform their doctor if they wish to become pregnant or think they might be pregnant. There is a potential for serious side effects to an unborn child. Talk to  your health care professional or pharmacist for more information. Do not breast-feed an infant while taking this medicine. What side effects may I notice from receiving this medicine? Side effects that you should report to your doctor or health care professional as soon as possible: -allergic reactions like skin rash, itching or hives, swelling of the face, lips, or tongue -signs of infection - fever or chills, cough, sore throat, pain or difficulty passing urine -signs of decreased platelets or bleeding - bruising, pinpoint red spots on the skin, black, tarry stools, nosebleeds -signs of decreased red blood cells - unusually weak or tired, fainting spells, lightheadedness -breathing problems -changes in hearing -changes in vision -chest pain -high blood pressure -low blood counts - This drug may decrease the number of white blood cells, red blood cells and platelets. You may be at increased risk for infections and bleeding. -nausea and vomiting -pain, swelling, redness or irritation at the injection site -pain, tingling, numbness in the hands or feet -problems with balance, talking, walking -trouble passing urine or change in the amount of urine Side effects that usually do not require medical attention (report to your doctor or health care professional if they continue or are bothersome): -hair loss -loss of appetite -metallic taste in the mouth or changes in taste This list may not describe all possible side effects. Call your doctor for medical advice about side effects. You may report side effects to FDA at 1-800-FDA-1088. Where should I keep my medicine? This drug is given in a hospital or clinic and will not be stored at home. NOTE: This sheet is a summary. It may not cover all possible information. If you have questions about this medicine, talk to your doctor, pharmacist, or health care provider.  2019 Elsevier/Gold Standard (2007-09-20 14:38:05)

## 2019-01-06 ENCOUNTER — Encounter: Payer: Self-pay | Admitting: Hematology and Oncology

## 2019-01-06 ENCOUNTER — Inpatient Hospital Stay: Payer: Medicare Other | Attending: Gynecologic Oncology | Admitting: Hematology and Oncology

## 2019-01-06 ENCOUNTER — Other Ambulatory Visit: Payer: Self-pay

## 2019-01-06 ENCOUNTER — Inpatient Hospital Stay: Payer: Medicare Other

## 2019-01-06 ENCOUNTER — Other Ambulatory Visit: Payer: Self-pay | Admitting: Hematology and Oncology

## 2019-01-06 VITALS — BP 129/81 | HR 87 | Temp 98.9°F | Resp 18 | Ht 67.0 in | Wt 212.2 lb

## 2019-01-06 DIAGNOSIS — R140 Abdominal distension (gaseous): Secondary | ICD-10-CM | POA: Insufficient documentation

## 2019-01-06 DIAGNOSIS — Z79899 Other long term (current) drug therapy: Secondary | ICD-10-CM | POA: Insufficient documentation

## 2019-01-06 DIAGNOSIS — R634 Abnormal weight loss: Secondary | ICD-10-CM | POA: Diagnosis not present

## 2019-01-06 DIAGNOSIS — R748 Abnormal levels of other serum enzymes: Secondary | ICD-10-CM | POA: Insufficient documentation

## 2019-01-06 DIAGNOSIS — R109 Unspecified abdominal pain: Secondary | ICD-10-CM | POA: Insufficient documentation

## 2019-01-06 DIAGNOSIS — R591 Generalized enlarged lymph nodes: Secondary | ICD-10-CM | POA: Diagnosis not present

## 2019-01-06 DIAGNOSIS — C569 Malignant neoplasm of unspecified ovary: Secondary | ICD-10-CM

## 2019-01-06 DIAGNOSIS — J90 Pleural effusion, not elsewhere classified: Secondary | ICD-10-CM | POA: Diagnosis not present

## 2019-01-06 DIAGNOSIS — J948 Other specified pleural conditions: Secondary | ICD-10-CM | POA: Insufficient documentation

## 2019-01-06 DIAGNOSIS — R188 Other ascites: Secondary | ICD-10-CM | POA: Insufficient documentation

## 2019-01-06 DIAGNOSIS — I700 Atherosclerosis of aorta: Secondary | ICD-10-CM | POA: Diagnosis not present

## 2019-01-06 DIAGNOSIS — M25559 Pain in unspecified hip: Secondary | ICD-10-CM | POA: Diagnosis not present

## 2019-01-06 DIAGNOSIS — R6881 Early satiety: Secondary | ICD-10-CM | POA: Insufficient documentation

## 2019-01-06 DIAGNOSIS — R918 Other nonspecific abnormal finding of lung field: Secondary | ICD-10-CM | POA: Diagnosis not present

## 2019-01-06 DIAGNOSIS — R5383 Other fatigue: Secondary | ICD-10-CM | POA: Insufficient documentation

## 2019-01-06 DIAGNOSIS — G8929 Other chronic pain: Secondary | ICD-10-CM | POA: Insufficient documentation

## 2019-01-06 DIAGNOSIS — E039 Hypothyroidism, unspecified: Secondary | ICD-10-CM | POA: Diagnosis not present

## 2019-01-06 DIAGNOSIS — Z7189 Other specified counseling: Secondary | ICD-10-CM | POA: Insufficient documentation

## 2019-01-06 DIAGNOSIS — C786 Secondary malignant neoplasm of retroperitoneum and peritoneum: Secondary | ICD-10-CM | POA: Diagnosis not present

## 2019-01-06 DIAGNOSIS — Z5111 Encounter for antineoplastic chemotherapy: Secondary | ICD-10-CM | POA: Insufficient documentation

## 2019-01-06 LAB — CMP (CANCER CENTER ONLY)
ALT: 70 U/L — ABNORMAL HIGH (ref 0–44)
AST: 48 U/L — ABNORMAL HIGH (ref 15–41)
Albumin: 3.6 g/dL (ref 3.5–5.0)
Alkaline Phosphatase: 133 U/L — ABNORMAL HIGH (ref 38–126)
Anion gap: 9 (ref 5–15)
BUN: 24 mg/dL — ABNORMAL HIGH (ref 8–23)
CO2: 26 mmol/L (ref 22–32)
Calcium: 10.2 mg/dL (ref 8.9–10.3)
Chloride: 104 mmol/L (ref 98–111)
Creatinine: 1.01 mg/dL — ABNORMAL HIGH (ref 0.44–1.00)
GFR, Est AFR Am: >60 mL/min (ref >60)
GFR, Estimated: 54 mL/min — ABNORMAL LOW (ref >60)
Glucose, Bld: 132 mg/dL — ABNORMAL HIGH (ref 70–99)
Potassium: 5.1 mmol/L (ref 3.5–5.1)
Sodium: 139 mmol/L (ref 135–145)
Total Bilirubin: 0.2 mg/dL — ABNORMAL LOW (ref 0.3–1.2)
Total Protein: 8.2 g/dL — ABNORMAL HIGH (ref 6.5–8.1)

## 2019-01-06 LAB — CBC WITH DIFFERENTIAL (CANCER CENTER ONLY)
Abs Immature Granulocytes: 0.05 10*3/uL (ref 0.00–0.07)
Basophils Absolute: 0 10*3/uL (ref 0.0–0.1)
Basophils Relative: 0 %
Eosinophils Absolute: 0 10*3/uL (ref 0.0–0.5)
Eosinophils Relative: 0 %
HCT: 39.8 % (ref 36.0–46.0)
Hemoglobin: 13.3 g/dL (ref 12.0–15.0)
Immature Granulocytes: 1 %
Lymphocytes Relative: 14 %
Lymphs Abs: 1.2 10*3/uL (ref 0.7–4.0)
MCH: 30.8 pg (ref 26.0–34.0)
MCHC: 33.4 g/dL (ref 30.0–36.0)
MCV: 92.1 fL (ref 80.0–100.0)
Monocytes Absolute: 0.3 10*3/uL (ref 0.1–1.0)
Monocytes Relative: 4 %
Neutro Abs: 6.9 10*3/uL (ref 1.7–7.7)
Neutrophils Relative %: 81 %
Platelet Count: 223 10*3/uL (ref 150–400)
RBC: 4.32 MIL/uL (ref 3.87–5.11)
RDW: 12.6 % (ref 11.5–15.5)
WBC Count: 8.4 10*3/uL (ref 4.0–10.5)
nRBC: 0 % (ref 0.0–0.2)

## 2019-01-06 MED ORDER — SODIUM CHLORIDE 0.9% FLUSH
10.0000 mL | Freq: Once | INTRAVENOUS | Status: AC
  Administered 2019-01-06: 10 mL
  Filled 2019-01-06: qty 10

## 2019-01-06 MED ORDER — PALONOSETRON HCL INJECTION 0.25 MG/5ML
INTRAVENOUS | Status: AC
  Filled 2019-01-06: qty 5

## 2019-01-06 MED ORDER — SODIUM CHLORIDE 0.9 % IV SOLN
Freq: Once | INTRAVENOUS | Status: AC
  Administered 2019-01-06: 13:00:00 via INTRAVENOUS
  Filled 2019-01-06: qty 5

## 2019-01-06 MED ORDER — FAMOTIDINE IN NACL 20-0.9 MG/50ML-% IV SOLN
20.0000 mg | Freq: Once | INTRAVENOUS | Status: AC
  Administered 2019-01-06: 20 mg via INTRAVENOUS

## 2019-01-06 MED ORDER — PALONOSETRON HCL INJECTION 0.25 MG/5ML
0.2500 mg | Freq: Once | INTRAVENOUS | Status: AC
  Administered 2019-01-06: 0.25 mg via INTRAVENOUS

## 2019-01-06 MED ORDER — HEPARIN SOD (PORK) LOCK FLUSH 100 UNIT/ML IV SOLN
500.0000 [IU] | Freq: Once | INTRAVENOUS | Status: AC | PRN
  Administered 2019-01-06: 500 [IU]
  Filled 2019-01-06: qty 5

## 2019-01-06 MED ORDER — SODIUM CHLORIDE 0.9 % IV SOLN
140.0000 mg/m2 | Freq: Once | INTRAVENOUS | Status: AC
  Administered 2019-01-06: 300 mg via INTRAVENOUS
  Filled 2019-01-06: qty 50

## 2019-01-06 MED ORDER — SODIUM CHLORIDE 0.9% FLUSH
10.0000 mL | INTRAVENOUS | Status: DC | PRN
  Administered 2019-01-06: 10 mL
  Filled 2019-01-06: qty 10

## 2019-01-06 MED ORDER — DIPHENHYDRAMINE HCL 50 MG/ML IJ SOLN
50.0000 mg | Freq: Once | INTRAMUSCULAR | Status: AC
  Administered 2019-01-06: 50 mg via INTRAVENOUS

## 2019-01-06 MED ORDER — SODIUM CHLORIDE 0.9 % IV SOLN
592.2000 mg | Freq: Once | INTRAVENOUS | Status: AC
  Administered 2019-01-06: 590 mg via INTRAVENOUS
  Filled 2019-01-06: qty 59

## 2019-01-06 MED ORDER — FAMOTIDINE IN NACL 20-0.9 MG/50ML-% IV SOLN
INTRAVENOUS | Status: AC
  Filled 2019-01-06: qty 50

## 2019-01-06 MED ORDER — ALTEPLASE 2 MG IJ SOLR
2.0000 mg | Freq: Once | INTRAMUSCULAR | Status: AC
  Administered 2019-01-06: 2 mg
  Filled 2019-01-06: qty 2

## 2019-01-06 MED ORDER — SODIUM CHLORIDE 0.9 % IV SOLN
Freq: Once | INTRAVENOUS | Status: AC
  Administered 2019-01-06: 12:00:00 via INTRAVENOUS
  Filled 2019-01-06: qty 250

## 2019-01-06 MED ORDER — ALTEPLASE 2 MG IJ SOLR
INTRAMUSCULAR | Status: AC
  Filled 2019-01-06: qty 2

## 2019-01-06 MED ORDER — DIPHENHYDRAMINE HCL 50 MG/ML IJ SOLN
INTRAMUSCULAR | Status: AC
  Filled 2019-01-06: qty 1

## 2019-01-06 NOTE — Assessment & Plan Note (Signed)
This appears to have resolved We will monitor closely

## 2019-01-06 NOTE — Assessment & Plan Note (Signed)
She has excessive poor sleep likely contributing to fatigue I will check her thyroid function in her next visit

## 2019-01-06 NOTE — Patient Instructions (Signed)
[facility] Discharge Instructions for Patients Receiving Chemotherapy  Today you received the following chemotherapy agents Carboplatin, Taxol  To help prevent nausea and vomiting after your treatment, we encourage you to take your nausea medication as directed by your MD.   If you develop nausea and vomiting that is not controlled by your nausea medication, call the clinic.   BELOW ARE SYMPTOMS THAT SHOULD BE REPORTED IMMEDIATELY:  *FEVER GREATER THAN 100.5 F  *CHILLS WITH OR WITHOUT FEVER  NAUSEA AND VOMITING THAT IS NOT CONTROLLED WITH YOUR NAUSEA MEDICATION  *UNUSUAL SHORTNESS OF BREATH  *UNUSUAL BRUISING OR BLEEDING  TENDERNESS IN MOUTH AND THROAT WITH OR WITHOUT PRESENCE OF ULCERS  *URINARY PROBLEMS  *BOWEL PROBLEMS  UNUSUAL RASH Items with * indicate a potential emergency and should be followed up as soon as possible.  Feel free to call the clinic should you have any questions or concerns. The clinic phone number is [phone].  Please show the [facility] at check-in to the Emergency Department and triage nurse.  Coronavirus (COVID-19) Are you at risk?  Are you at risk for the Coronavirus (COVID-19)?  To be considered HIGH RISK for Coronavirus (COVID-19), you have to meet the following criteria:  . Traveled to [place], [place], [place], [place] or [place]; or in the [place] to [place], [place], [place], or [place]; and have fever, cough, and shortness of breath within the last 2 weeks of travel OR . Been in close contact with a person diagnosed with COVID-19 within the last 2 weeks and have fever, cough, and shortness of breath . IF YOU DO NOT MEET THESE CRITERIA, YOU ARE CONSIDERED LOW RISK FOR COVID-19.  What to do if you are HIGH RISK for COVID-19?  Marland Kitchen If you are having a medical emergency, call 911. . Seek medical care right away. Before you go to a doctor's office, urgent care or emergency department, call ahead and tell  them about your recent travel, contact with someone diagnosed with COVID-19, and your symptoms. You should receive instructions from your physician's office regarding next steps of care.  . When you arrive at healthcare provider, tell the healthcare staff immediately you have returned from visiting [place], [place], [place], [place] or [place]; or traveled in the [place] to [place], [place], [place], or [place]; in the last two weeks or you have been in close contact with a person diagnosed with COVID-19 in the last 2 weeks.   . Tell the health care staff about your symptoms: fever, cough and shortness of breath. . After you have been seen by a medical provider, you will be either: o Tested for (COVID-19) and discharged home on quarantine except to seek medical care if symptoms worsen, and asked to  - Stay home and avoid contact with others until you get your results (4-5 days)  - Avoid travel on public transportation if possible (such as bus, train, or airplane) or o Sent to the Emergency Department by EMS for evaluation, COVID-19 testing, and possible admission depending on your condition and test results.  What to do if you are LOW RISK for COVID-19?  Reduce your risk of any infection by using the same precautions used for avoiding the common cold or flu:  Marland Kitchen Wash your hands often with soap and warm water for at least 20 seconds.  If soap and water are not readily available, use an alcohol-based hand sanitizer with at least 60% alcohol.  Marland Kitchen  If coughing or sneezing, cover your mouth and nose by coughing or sneezing into the elbow areas of your shirt or coat, into a tissue or into your sleeve (not your hands). . Avoid shaking hands with others and consider head nods or verbal greetings only. . Avoid touching your eyes, nose, or mouth with unwashed hands.  . Avoid close contact with people who are sick. . Avoid places or events with large numbers of people in one location, like concerts or  sporting events. . Carefully consider travel plans you have or are making. . If you are planning any travel outside or inside the [place], visit the CDC's Travelers' Health webpage for the latest health notices. . If you have some symptoms but not all symptoms, continue to monitor at home and seek medical attention if your symptoms worsen. . If you are having a medical emergency, call 911.   [facility] / e-Visit: [email or web address]         [facility]: [facility]: [phone]                   [facility]: [phone]

## 2019-01-06 NOTE — Assessment & Plan Note (Signed)
She has mildly elevated liver enzymes We will monitor closely

## 2019-01-06 NOTE — Progress Notes (Signed)
Pt returned for treatment today. After accessing port a cath no blood return was given. Pt received Cathflo per Amy Shackleton at 10.34 AM and labs are being drawn peripherally .

## 2019-01-06 NOTE — Progress Notes (Signed)
[facility] OFFICE PROGRESS NOTE  Patient Care Team: Caryl Bis, MD as PCP - General (Family Medicine)  ASSESSMENT & PLAN:  Ovarian cancer [facility]) She had multiple side effects from treatment including intermittent abdominal pain, poor sleep, fatigue and bone aches She denies peripheral neuropathy She had no nausea Her bowel habits are regular with MiraLAX She continues to have early satiety and has lost some weight but she is breathing better We will order tumor marker monitoring I will order CT scan next month for objective assessment of response to therapy Clinically, I did not detect any signs of recurrent pleural effusion We will proceed with treatment with similar dose of treatment  Bilateral pleural effusion This appears to have resolved We will monitor closely  Elevated liver enzymes She has mildly elevated liver enzymes We will monitor closely  Other fatigue She has excessive poor sleep likely contributing to fatigue I will check her thyroid function in her next visit   Orders Placed This Encounter  Procedures  . CT ABDOMEN PELVIS W CONTRAST    Standing Status:   Future    Standing Expiration Date:   01/06/2020    Order Specific Question:   If indicated for the ordered procedure, I authorize the administration of contrast media per Radiology protocol    Answer:   Yes    Order Specific Question:   Preferred imaging location?    Answer:   [facility]    Order Specific Question:   Radiology Contrast Protocol - do NOT remove file path    Answer:   \\[email or web address].pdf  . CT CHEST W CONTRAST    Standing Status:   Future    Standing Expiration Date:   01/06/2020    Order Specific Question:   If indicated for the ordered procedure, I authorize the administration of contrast media per Radiology protocol    Answer:   Yes    Order Specific Question:   Preferred imaging location?    Answer:   [facility]    Order  Specific Question:   Radiology Contrast Protocol - do NOT remove file path    Answer:   \\[email or web address].pdf  . T4, free    Standing Status:   Future    Standing Expiration Date:   02/10/2020  . TSH    Standing Status:   Future    Standing Expiration Date:   02/10/2020    INTERVAL HISTORY: Please see below for problem oriented charting. She returns for further follow-up She tolerated cycle 1 of treatment poorly with multiple different side effects including excessive fatigue, intermittent abdominal discomfort, early satiety, mild weight loss and bone aches Her bowel habits are regular with MiraLAX She had no nausea Denies peripheral neuropathy She is breathing better and denies cough, chest pain or shortness of breath  SUMMARY OF ONCOLOGIC HISTORY: Oncology History  Ovarian cancer ([facility])  10/24/2018 Initial Diagnosis   Her symptoms began in April/May, 2020.  She has bloating and early satiety. Shortness of breath with walking. She denied bleeding. She reported constipation with pain with defecation and narrowed stools   11/29/2018 Imaging   1. 13 cm complex cystic lesion in the central pelvis, highly suspicious for ovarian cystadenocarcinoma. 2. Diffuse peritoneal carcinomatosis with mild ascites. 3. Mild lymphadenopathy in porta hepatis and right cardiophrenic angle, suspicious for metastatic disease. 4. Moderate right and tiny left pleural effusions   12/05/2018 Tumor Marker   Patient's tumor was tested for the following markers: CA-125 Results of  the tumor marker test revealed 1015.   12/06/2018 Cancer Staging   Staging form: Ovary, Fallopian Tube, and Primary Peritoneal Carcinoma, AJCC 8th Edition - Clinical: FIGO Stage IVA, calculated as Stage IV (cT3c, cN1, cM1) - Signed by [name], MD on 12/06/2018   12/09/2018 Pathology Results   PLEURAL FLUID, RIGHT (SPECIMEN 1 OF 1 COLLECTED 12/09/18): - MALIGNANT CELLS CONSISTENT WITH METASTATIC ADENOCARCINOMA - SEE  COMMENT Comment The neoplastic cells are positive for cytokeratin 7 and Pax-8 but negative for cytokeratin 20, TTF-1, CDX-2 and Gata-3. Overall, the phenotype is consistent with the clinical impression of gynecologic primary.    12/15/2018 Procedure   Placement of single lumen port a cath via right internal jugular vein. The catheter tip lies at the cavo-atrial junction. A power injectable port a cath was placed and is ready for immediate use   12/16/2018 -  Chemotherapy   The patient had carboplatin and taxol for chemotherapy treatment.       REVIEW OF SYSTEMS:   Constitutional: Denies fevers, chills or abnormal weight loss Eyes: Denies blurriness of vision Ears, nose, mouth, throat, and face: Denies mucositis or sore throat Respiratory: Denies cough, dyspnea or wheezes Skin: Denies abnormal skin rashes Lymphatics: Denies new lymphadenopathy or easy bruising Neurological:Denies numbness, tingling or new weaknesses Behavioral/Psych: Mood is stable, no new changes  All other systems were reviewed with the patient and are negative.  I have reviewed the past medical history, past surgical history, social history and family history with the patient and they are unchanged from previous note.  ALLERGIES:  has No Known Allergies.  MEDICATIONS:  Current Outpatient Medications  Medication Sig Dispense Refill  . aspirin EC 81 MG tablet Take 81 mg by mouth daily.    . cetirizine (ZYRTEC) 10 MG tablet Take 10 mg by mouth daily.    . Coenzyme Q10 (CO Q 10 PO) Take 200 mg by mouth daily.    Marland Kitchen dexamethasone (DECADRON) 4 MG tablet Take 2 tabs at the night before and 2 tab the morning of chemotherapy, every 3 weeks, by mouth with food 24 tablet 0  . esomeprazole (NEXIUM) 40 MG capsule Take 40 mg by mouth daily at 12 noon.    Marland Kitchen estradiol (ESTRACE) 1 MG tablet Take 1 mg by mouth daily.    Marland Kitchen ESTRADIOL ACETATE PO Take 20 mg by mouth daily.    Marland Kitchen ibuprofen (ADVIL) 200 MG tablet Take 200-400 mg by mouth  every 6 (six) hours as needed.    . lidocaine-prilocaine (EMLA) cream Apply to affected area once 30 g 3  . losartan (COZAAR) 50 MG tablet Take 50 mg by mouth daily.    Marland Kitchen omeprazole (PRILOSEC) 20 MG capsule Take 20 mg by mouth daily.    . ondansetron (ZOFRAN) 8 MG tablet Take 1 tablet (8 mg total) by mouth every 8 (eight) hours as needed for refractory nausea / vomiting. 30 tablet 1  . polyethylene glycol (MIRALAX / GLYCOLAX) 17 g packet Take 17 g by mouth 2 (two) times daily as needed.    . prochlorperazine (COMPAZINE) 10 MG tablet Take 1 tablet (10 mg total) by mouth every 6 (six) hours as needed (Nausea or vomiting). 30 tablet 1  . vitamin B-12 (CYANOCOBALAMIN) 500 MCG tablet Take 500 mcg by mouth daily.     No current facility-administered medications for this visit.    Facility-Administered Medications Ordered in Other Visits  Medication Dose Route Frequency Provider Last Rate Last Dose  . CARBOplatin (PARAPLATIN) 590 mg  in sodium chloride 0.9 % 250 mL chemo infusion  590 mg Intravenous Once Alvy Bimler, Caydin Yeatts, MD      . heparin lock flush 100 unit/mL  500 Units Intracatheter Once PRN Alvy Bimler, Elyn Krogh, MD      . PACLitaxel (TAXOL) 300 mg in sodium chloride 0.9 % 250 mL chemo infusion (> 80mg /m2)  140 mg/m2 (Treatment Plan Recorded) Intravenous Once Alvy Bimler, Saurabh Hettich, MD 100 mL/hr at 01/06/19 1406 300 mg at 01/06/19 1406  . sodium chloride flush (NS) 0.9 % injection 10 mL  10 mL Intracatheter PRN Alvy Bimler, Dago Jungwirth, MD        PHYSICAL EXAMINATION: ECOG PERFORMANCE STATUS: 1 - Symptomatic but completely ambulatory  Vitals:   01/06/19 1100  BP: 129/81  Pulse: 87  Resp: 18  Temp: 98.9 F (37.2 C)  SpO2: 100%   Filed Weights   01/06/19 1100  Weight: 212 lb 3.2 oz (96.3 kg)    GENERAL:alert, no distress and comfortable SKIN: skin color, texture, turgor are normal, no rashes or significant lesions EYES: normal, Conjunctiva are pink and non-injected, sclera clear OROPHARYNX:no exudate, no erythema and  lips, buccal mucosa, and tongue normal  NECK: supple, thyroid normal size, non-tender, without nodularity LYMPH:  no palpable lymphadenopathy in the cervical, axillary or inguinal LUNGS: clear to auscultation and percussion with normal breathing effort HEART: regular rate & rhythm and no murmurs and no lower extremity edema ABDOMEN:abdomen soft, non-tender and normal bowel sounds Musculoskeletal:no cyanosis of digits and no clubbing  NEURO: alert & oriented x 3 with fluent speech, no focal motor/sensory deficits  LABORATORY DATA:  I have reviewed the data as listed    Component Value Date/Time   NA 139 01/06/2019 1040   K 5.1 01/06/2019 1040   CL 104 01/06/2019 1040   CO2 26 01/06/2019 1040   GLUCOSE 132 (H) 01/06/2019 1040   BUN 24 (H) 01/06/2019 1040   CREATININE 1.01 (H) 01/06/2019 1040   CALCIUM 10.2 01/06/2019 1040   PROT 8.2 (H) 01/06/2019 1040   ALBUMIN 3.6 01/06/2019 1040   AST 48 (H) 01/06/2019 1040   ALT 70 (H) 01/06/2019 1040   ALKPHOS 133 (H) 01/06/2019 1040   BILITOT 0.2 (L) 01/06/2019 1040   GFRNONAA 54 (L) 01/06/2019 1040   GFRAA >60 01/06/2019 1040    No results found for: SPEP, UPEP  Lab Results  Component Value Date   WBC 8.4 01/06/2019   NEUTROABS 6.9 01/06/2019   HGB 13.3 01/06/2019   HCT 39.8 01/06/2019   MCV 92.1 01/06/2019   PLT 223 01/06/2019      Chemistry      Component Value Date/Time   NA 139 01/06/2019 1040   K 5.1 01/06/2019 1040   CL 104 01/06/2019 1040   CO2 26 01/06/2019 1040   BUN 24 (H) 01/06/2019 1040   CREATININE 1.01 (H) 01/06/2019 1040      Component Value Date/Time   CALCIUM 10.2 01/06/2019 1040   ALKPHOS 133 (H) 01/06/2019 1040   AST 48 (H) 01/06/2019 1040   ALT 70 (H) 01/06/2019 1040   BILITOT 0.2 (L) 01/06/2019 1040       RADIOGRAPHIC STUDIES: I have personally reviewed the radiological images as listed and agreed with the findings in the report. Dg Chest 1 View  Result Date: 12/09/2018 CLINICAL DATA:  Post  right thoracentesis. History of ovarian cancer. EXAM: CHEST  1 VIEW COMPARISON:  CT scan November 29, 2018.  Chest x-ray July 20, 2016. FINDINGS: The right-sided pleural effusion is much smaller  in the interval. No pneumothorax. Opacity in the right base is probably atelectasis from the recent effusion. Small left effusion, unchanged. No change in the cardiomediastinal silhouette. IMPRESSION: 1. No pneumothorax after right thoracentesis. Mild atelectasis in the right base. 2. A small left pleural effusion with underlying atelectasis is seen. Electronically Signed   By: Dorise Bullion III M.D   On: 12/09/2018 10:09   Ct Chest W Contrast  Result Date: 12/12/2018 CLINICAL DATA:  Evaluate pleural effusions. History of ovarian cancer. EXAM: CT CHEST WITH CONTRAST TECHNIQUE: Multidetector CT imaging of the chest was performed during intravenous contrast administration. CONTRAST:  60mL OMNIPAQUE IOHEXOL 300 MG/ML  SOLN COMPARISON:  None FINDINGS: Cardiovascular: Heart size appears within normal limits. No pericardial effusion identified. Aortic atherosclerosis. Mediastinum/Nodes: Normal appearance of the thyroid gland. The trachea appears patent and is midline. Normal appearance of the esophagus. No mediastinal, or hilar adenopathy. Lymph node at the right CP angle is unchanged measuring 1.1 cm, image 107/2. There is an adjacent soft tissue mass which extends to the pleural surface overlying the right lung anteriorly. Two adjacent lymph nodes are noted which extend to the pleural surface overlying the right middle lobe with adjacent pleural thickening and enhancement, image 112/2. Index node measures 1.1 by 2.0 cm, image 108/2. Previously 1.1 x 0.8 cm. Lungs/Pleura: Small left pleural effusion and moderate right pleural effusion identified. When compared with the previous exam the right pleural effusion is slightly decreased in volume in the left pleural effusion is slightly increased in volume. There is associated  overlying compressive type atelectasis. Small subpleural nodule within the posterior right upper lobe measures 8 mm, image 39/7. Tiny perifissural nodules are identified on the left extending along the oblique fissure. Upper Abdomen: Upper abdominal ascites is identified along with peritoneal nodularity. Soft tissue nodule within the left upper quadrant of the abdomen measures 1.8 cm, image 140/2. Previously this measured the same. Musculoskeletal: No aggressive lytic or sclerotic bone lesions. IMPRESSION: 1. Moderate right pleural effusion and small left pleural effusion. When compared with the previous exam there is progressive soft tissue nodularity/adenopathy within the right CP angle. This extends to the pleural surface overlying the right middle lobe and is concerning for pleural involvement by tumor. Recommend further evaluation with diagnostic thoracentesis. 2. Mild bilateral pleural nodularity is identified. The appearance is nonspecific. Certainly this could be seen with pleural spread of disease. 3. Abdominal ascites with peritoneal nodularity. Index peritoneal nodule in the left upper quadrant is unchanged from previous exam. 4.  Aortic Atherosclerosis (ICD10-I70.0). Electronically Signed   By: Kerby Moors M.D.   On: 12/12/2018 15:25   Ir Imaging Guided Port Insertion  Result Date: 12/15/2018 CLINICAL DATA:  Ovarian carcinoma and need for porta cath for chemotherapy. EXAM: IMPLANTED PORT A CATH PLACEMENT WITH ULTRASOUND AND FLUOROSCOPIC GUIDANCE ANESTHESIA/SEDATION: 2.0 mg IV Versed; 100 mcg IV Fentanyl Total Moderate Sedation Time:  37 minutes The patient's level of consciousness and physiologic status were continuously monitored during the procedure by Radiology nursing. Additional Medications: 2 g IV Ancef. FLUOROSCOPY TIME:  30 seconds.  7.7 mGy. PROCEDURE: The procedure, risks, benefits, and alternatives were explained to the patient. Questions regarding the procedure were encouraged and  answered. The patient understands and consents to the procedure. A time-out was performed prior to initiating the procedure. Ultrasound was utilized to confirm patency of the right internal jugular vein. The right neck and chest were prepped with chlorhexidine in a sterile fashion, and a sterile drape was applied covering the  operative field. Maximum barrier sterile technique with sterile gowns and gloves were used for the procedure. Local anesthesia was provided with 1% lidocaine. After creating a small venotomy incision, a 21 gauge needle was advanced into the right internal jugular vein under direct, real-time ultrasound guidance. Ultrasound image documentation was performed. After securing guidewire access, an 8 Fr dilator was placed. A J-wire was kinked to measure appropriate catheter length. A subcutaneous port pocket was then created along the upper chest wall utilizing sharp and blunt dissection. Portable cautery was utilized. The pocket was irrigated with sterile saline. A single lumen power injectable port was chosen for placement. The 8 Fr catheter was tunneled from the port pocket site to the venotomy incision. The port was placed in the pocket. External catheter was trimmed to appropriate length based on guidewire measurement. At the venotomy, an 8 Fr peel-away sheath was placed over a guidewire. The catheter was then placed through the sheath and the sheath removed. Final catheter positioning was confirmed and documented with a fluoroscopic spot image. The port was accessed with a needle and aspirated and flushed with heparinized saline. The access needle was removed. The venotomy and port pocket incisions were closed with subcutaneous 3-0 Monocryl and subcuticular 4-0 Vicryl. Dermabond was applied to both incisions. COMPLICATIONS: COMPLICATIONS None FINDINGS: After catheter placement, the tip lies at the cavo-atrial junction. The catheter aspirates normally and is ready for immediate use. IMPRESSION:  Placement of single lumen port a cath via right internal jugular vein. The catheter tip lies at the cavo-atrial junction. A power injectable port a cath was placed and is ready for immediate use. Electronically Signed   By: Aletta Edouard M.D.   On: 12/15/2018 16:38   US Thoracentesis Asp Pleural Space W/img Guide  Result Date: 12/09/2018 INDICATION: Patient with history of apparent metastatic ovarian cancer with moderate right pleural effusion. Request made for diagnostic and therapeutic right thoracentesis. EXAM: ULTRASOUND GUIDED DIAGNOSTIC AND THERAPEUTIC RIGHT THORACENTESIS MEDICATIONS: None COMPLICATIONS: None immediate. PROCEDURE: An ultrasound guided thoracentesis was thoroughly discussed with the patient and questions answered. The benefits, risks, alternatives and complications were also discussed. The patient understands and wishes to proceed with the procedure. Written consent was obtained. Ultrasound was performed to localize and mark an adequate pocket of fluid in the right chest. The area was then prepped and draped in the normal sterile fashion. 1% Lidocaine was used for local anesthesia. Under ultrasound guidance a 6 Fr Safe-T-Centesis catheter was introduced. Thoracentesis was performed. The catheter was removed and a dressing applied. FINDINGS: A total of approximately 1 liter of amber fluid was removed. Samples were sent to the laboratory as requested by the clinical team. IMPRESSION: Successful ultrasound guided diagnostic and therapeutic right thoracentesis yielding 1 liter of pleural fluid. Read by: Rowe Robert, PA-C Electronically Signed   By: Aletta Edouard M.D.   On: 12/09/2018 13:17    All questions were answered. The patient knows to call the clinic with any problems, questions or concerns. No barriers to learning was detected.  I spent 25 minutes counseling the patient face to face. The total time spent in the appointment was 30 minutes and more than 50% was on counseling and  review of test results  [name], MD 01/06/2019 4:35 PM

## 2019-01-06 NOTE — Assessment & Plan Note (Signed)
She had multiple side effects from treatment including intermittent abdominal pain, poor sleep, fatigue and bone aches She denies peripheral neuropathy She had no nausea Her bowel habits are regular with MiraLAX She continues to have early satiety and has lost some weight but she is breathing better We will order tumor marker monitoring I will order CT scan next month for objective assessment of response to therapy Clinically, I did not detect any signs of recurrent pleural effusion We will proceed with treatment with similar dose of treatment

## 2019-01-07 LAB — CA 125: Cancer Antigen (CA) 125: 948 U/mL — ABNORMAL HIGH (ref 0.0–38.1)

## 2019-01-09 ENCOUNTER — Telehealth: Payer: Self-pay | Admitting: *Deleted

## 2019-01-09 NOTE — Telephone Encounter (Signed)
Telephone call to patient to advise lab results. Patient reports she has body aches pretty bad this morning. She has not taken anything for it. Advised patient she could take her pain medication and see if that helps. She is hesitant. She has not moved around much today and will do this first to "warm up these old bones". She will contact this office if she is unable to resolve her aches and pains.

## 2019-01-09 NOTE — Telephone Encounter (Signed)
-----   Message from [name], MD sent at 01/09/2019  8:06 AM EDT ----- Regarding: FW: CA-125 is better  ----- Message ----- From: [name], MD Sent: 01/07/2019  10:18 AM EDT To: Paulla Dolly, RN Subject: CA-125 is better                               Pls call her and let her know

## 2019-01-11 ENCOUNTER — Telehealth: Payer: Self-pay | Admitting: Licensed Clinical Social Worker

## 2019-01-11 NOTE — Telephone Encounter (Signed)
Contacted patient for webex visit for pre reg

## 2019-01-11 NOTE — Telephone Encounter (Signed)
Called patient regarding upcoming Web[facility] appointment, patient would prefer this to be a walk-in visit. °

## 2019-01-12 ENCOUNTER — Inpatient Hospital Stay (HOSPITAL_BASED_OUTPATIENT_CLINIC_OR_DEPARTMENT_OTHER): Payer: Medicare Other | Admitting: Licensed Clinical Social Worker

## 2019-01-12 ENCOUNTER — Encounter: Payer: Self-pay | Admitting: Licensed Clinical Social Worker

## 2019-01-12 ENCOUNTER — Other Ambulatory Visit: Payer: Self-pay

## 2019-01-12 ENCOUNTER — Inpatient Hospital Stay: Payer: Medicare Other

## 2019-01-12 DIAGNOSIS — Z800 Family history of malignant neoplasm of digestive organs: Secondary | ICD-10-CM | POA: Insufficient documentation

## 2019-01-12 DIAGNOSIS — Z809 Family history of malignant neoplasm, unspecified: Secondary | ICD-10-CM | POA: Diagnosis not present

## 2019-01-12 DIAGNOSIS — C569 Malignant neoplasm of unspecified ovary: Secondary | ICD-10-CM | POA: Diagnosis not present

## 2019-01-12 NOTE — Progress Notes (Signed)
REFERRING PROVIDER: Everitt Amber, MD [place],  [place] [address]  PRIMARY PROVIDER:  Caryl Bis, MD  PRIMARY REASON FOR VISIT:  1. Malignant neoplasm of ovary, unspecified laterality ([place])   2. Family history of stomach cancer      HISTORY OF PRESENT ILLNESS:   Paula Peterson, a 78 y.o. female, was seen for a [place] cancer genetics consultation at the request of Dr. Denman George due to a personal and family history of cancer.  Paula Peterson presents to clinic today to discuss the possibility of a hereditary predisposition to cancer, genetic testing, and to further clarify her future cancer risks, as well as potential cancer risks for family members.   In 2020, at the age of 32, Paula Peterson was diagnosed with ovarian cancer. The treatment plan includes chemotherapy and surgery. She has not yet had surgery.    CANCER HISTORY:  Oncology History  Ovarian cancer ([place])  10/24/2018 Initial Diagnosis   Her symptoms began in April/May, 2020.  She has bloating and early satiety. Shortness of breath with walking. She denied bleeding. She reported constipation with pain with defecation and narrowed stools   11/29/2018 Imaging   1. 13 cm complex cystic lesion in the central pelvis, highly suspicious for ovarian cystadenocarcinoma. 2. Diffuse peritoneal carcinomatosis with mild ascites. 3. Mild lymphadenopathy in porta hepatis and right cardiophrenic angle, suspicious for metastatic disease. 4. Moderate right and tiny left pleural effusions   12/05/2018 Tumor Marker   Patient's tumor was tested for the following markers: CA-125 Results of the tumor marker test revealed 1015.   12/06/2018 Cancer Staging   Staging form: Ovary, Fallopian Tube, and Primary Peritoneal Carcinoma, AJCC 8th Edition - Clinical: FIGO Stage IVA, calculated as Stage IV (cT3c, cN1, cM1) - Signed by [name], MD on 12/06/2018   12/09/2018 Pathology Results   PLEURAL FLUID, RIGHT (SPECIMEN 1 OF 1 COLLECTED 12/09/18): -  MALIGNANT CELLS CONSISTENT WITH METASTATIC ADENOCARCINOMA - SEE COMMENT Comment The neoplastic cells are positive for cytokeratin 7 and Pax-8 but negative for cytokeratin 20, TTF-1, CDX-2 and Gata-3. Overall, the phenotype is consistent with the clinical impression of gynecologic primary.    12/15/2018 Procedure   Placement of single lumen port a cath via right internal jugular vein. The catheter tip lies at the cavo-atrial junction. A power injectable port a cath was placed and is ready for immediate use   12/16/2018 -  Chemotherapy   The patient had carboplatin and taxol for chemotherapy treatment.     01/06/2019 Tumor Marker   Patient's tumor was tested for the following markers: CA-125 Results of the tumor marker test revealed 948      RISK FACTORS:  Menarche was at age 33.  First live birth at age 38.  OCP use for approximately "many" years.  Ovaries intact: yes.  Hysterectomy: yes.  Menopausal status: postmenopausal.  Colonoscopy: yes; normal. Mammogram within the last year: no.  Past Medical History:  Diagnosis Date  . Back pain   . Family history of stomach cancer   . Family history of stomach cancer   . GERD (gastroesophageal reflux disease)   . Hypertension   . Pessary maintenance   . Stroke [facility])     Past Surgical History:  Procedure Laterality Date  . ABDOMINAL HYSTERECTOMY    . BACK SURGERY    . FRACTURE SURGERY    . IR IMAGING GUIDED PORT INSERTION  12/15/2018    Social History   Socioeconomic History  . Marital  status: Married    Spouse name: Fara Olden  . Number of children: 3  . Years of education: Not on file  . Highest education level: Not on file  Occupational History  . Occupation: retired Oncologist  Social Needs  . Financial resource strain: Not on file  . Food insecurity    Worry: Not on file    Inability: Not on file  . Transportation needs    Medical: Not on file    Non-medical: Not on file  Tobacco Use  . Smoking status: Never  Smoker  . Smokeless tobacco: Never Used  Substance and Sexual Activity  . Alcohol use: No  . Drug use: No  . Sexual activity: Not Currently  Lifestyle  . Physical activity    Days per week: Not on file    Minutes per session: Not on file  . Stress: Not on file  Relationships  . Social Herbalist on phone: Not on file    Gets together: Not on file    Attends religious service: Not on file    Active member of club or organization: Not on file    Attends meetings of clubs or organizations: Not on file    Relationship status: Not on file  Other Topics Concern  . Not on file  Social History Narrative  . Not on file     FAMILY HISTORY:  We obtained a detailed, 4-generation family history.  Significant diagnoses are listed below: Family History  Problem Relation Age of Onset  . Stomach cancer Sister        dx 47s-50s  . Dementia Mother   . Cancer Sister        unk type, hysterectomy, dx 40s    Paula Peterson has a daughter, [name], 67, and a son,  [name], 25. No history of cancer for her children. She has 2 sisters. One of her sisters had stomach cancer in her 18s-50s, and died at 61. This sister had 3 sons, one had cancer in his 71s but she is unsure the type. Paula Peterson's other sister also had cancer, she is unsure the type. She did have a hysterectomy and knows that it was diagnosed in her 48s. This sister is living at 50.  Paula Peterson's mother died at 89 due to dementia. The patient has very limited information about her maternal side. She knows she has many aunts and uncles and cousins, but does not have any information about them, or her maternal grandparents. No cancers she is aware of.  Paula Peterson's father died in his 24s by suicide. The patient has limited information about this side as well. She knows she has paternal aunts uncles and cousins, but does not know them. She also does not have information about her paternal grandparents.   Paula Peterson is unaware of previous family  history of genetic testing for hereditary cancer risks. There is no reported Ashkenazi Jewish ancestry. There is no known consanguinity.  GENETIC COUNSELING ASSESSMENT: [name] is a 78 y.o. female with a personal and family history which is somewhat suggestive of a hereditary cancer syndrome and predisposition to cancer. We, therefore, discussed and recommended the following at today's visit.   DISCUSSION: We discussed that 10 - 25% of ovarian cancer is hereditary, with most cases associated with BRCA1/BRCA2 mutations.  There are other genes that can be associated with hereditary ovarian cancer syndrome, including Lynch syndrome genes. We discussed that testing is beneficial for several reasons including knowing  if an individual is a potential candidate for target therapy, knowing how to follow individuals after completing their treatment, and understanding if other family members could be at risk for cancer and allow them to undergo genetic testing.   We reviewed the characteristics, features and inheritance patterns of hereditary cancer syndromes. We also discussed genetic testing, including the appropriate family members to test, the process of testing, insurance coverage and turn-around-time for results. We discussed the implications of a negative, positive and/or variant of uncertain significant result. We discussed tumor testing as well. We discussed that genetic testing through [facility] will test for hereditary mutations that could explain her diagnosis of cancer.  However, homologous recombination testing (HRD) is genetic testing performed on her tumor that can determine genetic changes that could influence her management.  HRD testing is performed in tandem with genetic testing, and typically at no additional cost.   Since Ms. Cage has not had surgery yet, we discussed that we will wait until after her surgery to do testing that way we can get the most information possible.  Once surgery is  complete, we recommended Ms. Whiteaker pursue genetic testing for the [facility] TumorNext HRD + CancerNext gene panel.   The CancerNext gene panel offered by [facility] [employer] includes sequencing and rearrangement analysis for the following 34 genes:   APC, ATM, BARD1, BMPR1A, BRCA1, BRCA2, BRIP1, CDH1, CDK4, CDKN2A, CHEK2, DICER1, EPCAM, GREM1, HOXB13, MLH1, MRE11A, MSH2, MSH6, MUTYH, NBN, NF1, PALB2, PMS2, POLD1, POLE, PTEN, RAD50, RAD51C, RAD51D, SMAD4, SMARCA4, STK11, and TP53.   Somatic genes analyzed through TumorNext-HRD: ATM, BARD1, BRCA1, BRCA2, BRIP1, CHEK2, MRE11A, NBN, PALB2, RAD51C, RAD51D.  Based on Ms. Brammer's personal and family history of cancer, she meets medical criteria for genetic testing. Despite that she meets criteria, she may still have an out of pocket cost. We discussed that if her out of pocket cost for testing is over $100, the laboratory will call and confirm whether she wants to proceed with testing.  If the out of pocket cost of testing is less than $100 she will be billed by the genetic testing laboratory.   PLAN: After considering the risks, benefits, and limitations, Ms. Batres provided informed consent to pursue genetic testing. We will wait until after her surgery is complete to order this testing that way we can get results from both HRD testing and germline testing.. Once her surgery is complete and the test is ordered, results should be available within approximately 6-8 weeks' time, at which point they will be disclosed by telephone to Ms. Kokesh, as will any additional recommendations warranted by these results. Ms. Peterkin will receive a summary of her genetic counseling visit and a copy of her results once available. This information will also be available in Epic.   Lastly, we encouraged Ms. Muraoka to remain in contact with cancer genetics annually so that we can continuously update the family history and inform her of any changes in cancer genetics and testing that may be of benefit  for this family.   Ms. Rauda questions were answered to her satisfaction today. Our contact information was provided should additional questions or concerns arise. Thank you for the referral and allowing [place] to share in the care of your patient.   Faith Rogue, MS, [facility] Genetic Counselor [email or web address] Phone: [phone]  The patient was seen for a total of 30 minutes in face-to-face genetic counseling. Her daughter, [name], was present via telephone. Drs. Magrinat, Lindi Adie and/or Burr Medico were available for discussion regarding this  case.   _______________________________________________________________________ For Office Staff:  Number of people involved in session: 2 Was an Intern/ student involved with case: no

## 2019-01-20 ENCOUNTER — Telehealth: Payer: Self-pay | Admitting: Oncology

## 2019-01-20 NOTE — Telephone Encounter (Signed)
Left a message with CT scan appointment on 02/02/19 at 10 am (9:45 arrival, npo 4 hours before, pick up contrast at the [facility] and drink 1st bottle of contrast at 8 am, second at 9 am).  Requested a return call to confirm appointment.

## 2019-01-27 ENCOUNTER — Inpatient Hospital Stay (HOSPITAL_BASED_OUTPATIENT_CLINIC_OR_DEPARTMENT_OTHER): Payer: Medicare Other | Admitting: Hematology and Oncology

## 2019-01-27 ENCOUNTER — Inpatient Hospital Stay: Payer: Medicare Other

## 2019-01-27 ENCOUNTER — Other Ambulatory Visit: Payer: Self-pay

## 2019-01-27 ENCOUNTER — Encounter: Payer: Self-pay | Admitting: Hematology and Oncology

## 2019-01-27 ENCOUNTER — Other Ambulatory Visit: Payer: Self-pay | Admitting: Hematology and Oncology

## 2019-01-27 DIAGNOSIS — C569 Malignant neoplasm of unspecified ovary: Secondary | ICD-10-CM

## 2019-01-27 DIAGNOSIS — R634 Abnormal weight loss: Secondary | ICD-10-CM

## 2019-01-27 DIAGNOSIS — J948 Other specified pleural conditions: Secondary | ICD-10-CM | POA: Diagnosis not present

## 2019-01-27 DIAGNOSIS — G8929 Other chronic pain: Secondary | ICD-10-CM

## 2019-01-27 DIAGNOSIS — R591 Generalized enlarged lymph nodes: Secondary | ICD-10-CM

## 2019-01-27 DIAGNOSIS — R188 Other ascites: Secondary | ICD-10-CM

## 2019-01-27 DIAGNOSIS — I700 Atherosclerosis of aorta: Secondary | ICD-10-CM | POA: Diagnosis not present

## 2019-01-27 DIAGNOSIS — R918 Other nonspecific abnormal finding of lung field: Secondary | ICD-10-CM

## 2019-01-27 DIAGNOSIS — C786 Secondary malignant neoplasm of retroperitoneum and peritoneum: Secondary | ICD-10-CM | POA: Diagnosis not present

## 2019-01-27 DIAGNOSIS — R140 Abdominal distension (gaseous): Secondary | ICD-10-CM

## 2019-01-27 DIAGNOSIS — R109 Unspecified abdominal pain: Secondary | ICD-10-CM | POA: Diagnosis not present

## 2019-01-27 DIAGNOSIS — E039 Hypothyroidism, unspecified: Secondary | ICD-10-CM

## 2019-01-27 DIAGNOSIS — Z79899 Other long term (current) drug therapy: Secondary | ICD-10-CM

## 2019-01-27 DIAGNOSIS — R5383 Other fatigue: Secondary | ICD-10-CM

## 2019-01-27 DIAGNOSIS — R748 Abnormal levels of other serum enzymes: Secondary | ICD-10-CM

## 2019-01-27 DIAGNOSIS — R6881 Early satiety: Secondary | ICD-10-CM | POA: Diagnosis not present

## 2019-01-27 DIAGNOSIS — J90 Pleural effusion, not elsewhere classified: Secondary | ICD-10-CM | POA: Diagnosis not present

## 2019-01-27 DIAGNOSIS — M25559 Pain in unspecified hip: Secondary | ICD-10-CM | POA: Diagnosis not present

## 2019-01-27 DIAGNOSIS — Z5111 Encounter for antineoplastic chemotherapy: Secondary | ICD-10-CM | POA: Diagnosis not present

## 2019-01-27 LAB — CMP (CANCER CENTER ONLY)
ALT: 32 U/L (ref 0–44)
AST: 21 U/L (ref 15–41)
Albumin: 3.5 g/dL (ref 3.5–5.0)
Alkaline Phosphatase: 121 U/L (ref 38–126)
Anion gap: 10 (ref 5–15)
BUN: 24 mg/dL — ABNORMAL HIGH (ref 8–23)
CO2: 24 mmol/L (ref 22–32)
Calcium: 10.2 mg/dL (ref 8.9–10.3)
Chloride: 105 mmol/L (ref 98–111)
Creatinine: 0.99 mg/dL (ref 0.44–1.00)
GFR, Est AFR Am: >60 mL/min (ref >60)
GFR, Estimated: 56 mL/min — ABNORMAL LOW (ref >60)
Glucose, Bld: 147 mg/dL — ABNORMAL HIGH (ref 70–99)
Potassium: 4.4 mmol/L (ref 3.5–5.1)
Sodium: 139 mmol/L (ref 135–145)
Total Bilirubin: 0.2 mg/dL — ABNORMAL LOW (ref 0.3–1.2)
Total Protein: 7.8 g/dL (ref 6.5–8.1)

## 2019-01-27 LAB — CBC WITH DIFFERENTIAL (CANCER CENTER ONLY)
Abs Immature Granulocytes: 0.02 10*3/uL (ref 0.00–0.07)
Basophils Absolute: 0 10*3/uL (ref 0.0–0.1)
Basophils Relative: 0 %
Eosinophils Absolute: 0 10*3/uL (ref 0.0–0.5)
Eosinophils Relative: 0 %
HCT: 37.5 % (ref 36.0–46.0)
Hemoglobin: 12.5 g/dL (ref 12.0–15.0)
Immature Granulocytes: 0 %
Lymphocytes Relative: 17 %
Lymphs Abs: 0.9 10*3/uL (ref 0.7–4.0)
MCH: 30.3 pg (ref 26.0–34.0)
MCHC: 33.3 g/dL (ref 30.0–36.0)
MCV: 91 fL (ref 80.0–100.0)
Monocytes Absolute: 0.1 10*3/uL (ref 0.1–1.0)
Monocytes Relative: 3 %
Neutro Abs: 4.3 10*3/uL (ref 1.7–7.7)
Neutrophils Relative %: 80 %
Platelet Count: 257 10*3/uL (ref 150–400)
RBC: 4.12 MIL/uL (ref 3.87–5.11)
RDW: 13.5 % (ref 11.5–15.5)
WBC Count: 5.4 10*3/uL (ref 4.0–10.5)
nRBC: 0 % (ref 0.0–0.2)

## 2019-01-27 LAB — T4, FREE: Free T4: 0.88 ng/dL (ref 0.61–1.12)

## 2019-01-27 LAB — TSH: TSH: 5.359 u[IU]/mL — ABNORMAL HIGH (ref 0.308–3.960)

## 2019-01-27 MED ORDER — PALONOSETRON HCL INJECTION 0.25 MG/5ML
0.2500 mg | Freq: Once | INTRAVENOUS | Status: AC
  Administered 2019-01-27: 0.25 mg via INTRAVENOUS

## 2019-01-27 MED ORDER — FAMOTIDINE IN NACL 20-0.9 MG/50ML-% IV SOLN
INTRAVENOUS | Status: AC
  Filled 2019-01-27: qty 50

## 2019-01-27 MED ORDER — FAMOTIDINE IN NACL 20-0.9 MG/50ML-% IV SOLN
20.0000 mg | Freq: Once | INTRAVENOUS | Status: AC
  Administered 2019-01-27: 20 mg via INTRAVENOUS

## 2019-01-27 MED ORDER — PALONOSETRON HCL INJECTION 0.25 MG/5ML
INTRAVENOUS | Status: AC
  Filled 2019-01-27: qty 5

## 2019-01-27 MED ORDER — SODIUM CHLORIDE 0.9 % IV SOLN
Freq: Once | INTRAVENOUS | Status: AC
  Administered 2019-01-27: 10:00:00 via INTRAVENOUS
  Filled 2019-01-27: qty 250

## 2019-01-27 MED ORDER — TRAMADOL HCL 50 MG PO TABS: 50.0000 mg | ORAL_TABLET | Freq: Four times a day (QID) | ORAL | 0 refills | Status: DC | PRN

## 2019-01-27 MED ORDER — DIPHENHYDRAMINE HCL 50 MG/ML IJ SOLN
50.0000 mg | Freq: Once | INTRAMUSCULAR | Status: AC
  Administered 2019-01-27: 50 mg via INTRAVENOUS

## 2019-01-27 MED ORDER — SODIUM CHLORIDE 0.9 % IV SOLN
596.4000 mg | Freq: Once | INTRAVENOUS | Status: AC
  Administered 2019-01-27: 600 mg via INTRAVENOUS
  Filled 2019-01-27: qty 60

## 2019-01-27 MED ORDER — DIPHENHYDRAMINE HCL 50 MG/ML IJ SOLN
INTRAMUSCULAR | Status: AC
  Filled 2019-01-27: qty 1

## 2019-01-27 MED ORDER — HEPARIN SOD (PORK) LOCK FLUSH 100 UNIT/ML IV SOLN
500.0000 [IU] | Freq: Once | INTRAVENOUS | Status: AC | PRN
  Administered 2019-01-27: 500 [IU]
  Filled 2019-01-27: qty 5

## 2019-01-27 MED ORDER — LEVOTHYROXINE SODIUM 25 MCG PO TABS: 25.0000 ug | ORAL_TABLET | Freq: Every day | ORAL | 11 refills | Status: DC

## 2019-01-27 MED ORDER — SODIUM CHLORIDE 0.9 % IV SOLN
Freq: Once | INTRAVENOUS | Status: AC
  Administered 2019-01-27: 10:00:00 via INTRAVENOUS
  Filled 2019-01-27: qty 5

## 2019-01-27 MED ORDER — SODIUM CHLORIDE 0.9% FLUSH
10.0000 mL | INTRAVENOUS | Status: DC | PRN
  Administered 2019-01-27: 10 mL
  Filled 2019-01-27: qty 10

## 2019-01-27 MED ORDER — SODIUM CHLORIDE 0.9% FLUSH
10.0000 mL | Freq: Once | INTRAVENOUS | Status: AC
  Administered 2019-01-27: 10 mL
  Filled 2019-01-27: qty 10

## 2019-01-27 MED ORDER — SODIUM CHLORIDE 0.9 % IV SOLN
140.0000 mg/m2 | Freq: Once | INTRAVENOUS | Status: AC
  Administered 2019-01-27: 11:00:00 300 mg via INTRAVENOUS
  Filled 2019-01-27: qty 50

## 2019-01-27 MED FILL — traMADol HCL 50 MG TABS: 50 | 7 days supply | Qty: 30 | Fill #0

## 2019-01-27 MED FILL — LEVOTHYROXINE 25 MCG TABLET: 25 | 30 days supply | Qty: 30 | Fill #0

## 2019-01-27 NOTE — Progress Notes (Signed)
[facility] OFFICE PROGRESS NOTE  Patient Care Team: Caryl Bis, MD as PCP - General (Family Medicine)  ASSESSMENT & PLAN:  Ovarian cancer [facility]) She tolerated cycle 2 of therapy better except for bone aches Recent tumor marker has improved Her breathing is normal and she denies recent sensation of bloating or changes in bowel habits Clinically, I believe the patient has responded well to treatment We will proceed with cycle 3 of treatment without dose adjustment She has CT scan scheduled for next week for objective assessment of response to therapy If she have excellent response to treatment, she might undergo interval debulking surgery after cycle 3 of therapy.  Acquired hypothyroidism She has profound fatigue TSH came back high I recommend trial of low-dose thyroid replacement therapy  Chronic hip pain She has recent exacerbation of bone pain secondary to side effects of chemotherapy I recommend a trial of tramadol We discussed expected risks and side effects and she agreed to proceed   No orders of the defined types were placed in this encounter.   INTERVAL HISTORY: Please see below for problem oriented charting. She returns for cycle 3 of chemotherapy She tolerated treatment well except for bone aches and pain She is breathing well She denies nausea or constipation She complains of fatigue  SUMMARY OF ONCOLOGIC HISTORY: Oncology History  Ovarian cancer ([place])  10/24/2018 Initial Diagnosis   Her symptoms began in April/May, 2020.  She has bloating and early satiety. Shortness of breath with walking. She denied bleeding. She reported constipation with pain with defecation and narrowed stools   11/29/2018 Imaging   1. 13 cm complex cystic lesion in the central pelvis, highly suspicious for ovarian cystadenocarcinoma. 2. Diffuse peritoneal carcinomatosis with mild ascites. 3. Mild lymphadenopathy in porta hepatis and right cardiophrenic angle, suspicious  for metastatic disease. 4. Moderate right and tiny left pleural effusions   12/05/2018 Tumor Marker   Patient's tumor was tested for the following markers: CA-125 Results of the tumor marker test revealed 1015.   12/06/2018 Cancer Staging   Staging form: Ovary, Fallopian Tube, and Primary Peritoneal Carcinoma, AJCC 8th Edition - Clinical: FIGO Stage IVA, calculated as Stage IV (cT3c, cN1, cM1) - Signed by [name], MD on 12/06/2018   12/09/2018 Pathology Results   PLEURAL FLUID, RIGHT (SPECIMEN 1 OF 1 COLLECTED 12/09/18): - MALIGNANT CELLS CONSISTENT WITH METASTATIC ADENOCARCINOMA - SEE COMMENT Comment The neoplastic cells are positive for cytokeratin 7 and Pax-8 but negative for cytokeratin 20, TTF-1, CDX-2 and Gata-3. Overall, the phenotype is consistent with the clinical impression of gynecologic primary.    12/15/2018 Procedure   Placement of single lumen port a cath via right internal jugular vein. The catheter tip lies at the cavo-atrial junction. A power injectable port a cath was placed and is ready for immediate use   12/16/2018 -  Chemotherapy   The patient had carboplatin and taxol for chemotherapy treatment.     01/06/2019 Tumor Marker   Patient's tumor was tested for the following markers: CA-125 Results of the tumor marker test revealed 948     REVIEW OF SYSTEMS:   Constitutional: Denies fevers, chills or abnormal weight loss Eyes: Denies blurriness of vision Ears, nose, mouth, throat, and face: Denies mucositis or sore throat Respiratory: Denies cough, dyspnea or wheezes Cardiovascular: Denies palpitation, chest discomfort or lower extremity swelling Gastrointestinal:  Denies nausea, heartburn or change in bowel habits Skin: Denies abnormal skin rashes Lymphatics: Denies new lymphadenopathy or easy bruising Neurological:Denies numbness, tingling  or new weaknesses Behavioral/Psych: Mood is stable, no new changes  All other systems were reviewed with the patient and are  negative.  I have reviewed the past medical history, past surgical history, social history and family history with the patient and they are unchanged from previous note.  ALLERGIES:  has No Known Allergies.  MEDICATIONS:  Current Outpatient Medications  Medication Sig Dispense Refill  . aspirin EC 81 MG tablet Take 81 mg by mouth daily.    . cetirizine (ZYRTEC) 10 MG tablet Take 10 mg by mouth daily.    . Coenzyme Q10 (CO Q 10 PO) Take 200 mg by mouth daily.    Marland Kitchen dexamethasone (DECADRON) 4 MG tablet Take 2 tabs at the night before and 2 tab the morning of chemotherapy, every 3 weeks, by mouth with food 24 tablet 0  . esomeprazole (NEXIUM) 40 MG capsule Take 40 mg by mouth daily at 12 noon.    Marland Kitchen estradiol (ESTRACE) 1 MG tablet Take 1 mg by mouth daily.    Marland Kitchen ESTRADIOL ACETATE PO Take 20 mg by mouth daily.    Marland Kitchen ibuprofen (ADVIL) 200 MG tablet Take 200-400 mg by mouth every 6 (six) hours as needed.    Marland Kitchen levothyroxine (SYNTHROID) 25 MCG tablet Take 1 tablet (25 mcg total) by mouth daily before breakfast. 30 tablet 11  . lidocaine-prilocaine (EMLA) cream Apply to affected area once 30 g 3  . losartan (COZAAR) 50 MG tablet Take 50 mg by mouth daily.    Marland Kitchen omeprazole (PRILOSEC) 20 MG capsule Take 20 mg by mouth daily.    . ondansetron (ZOFRAN) 8 MG tablet Take 1 tablet (8 mg total) by mouth every 8 (eight) hours as needed for refractory nausea / vomiting. 30 tablet 1  . polyethylene glycol (MIRALAX / GLYCOLAX) 17 g packet Take 17 g by mouth 2 (two) times daily as needed.    . prochlorperazine (COMPAZINE) 10 MG tablet Take 1 tablet (10 mg total) by mouth every 6 (six) hours as needed (Nausea or vomiting). 30 tablet 1  . traMADol (ULTRAM) 50 MG tablet Take 1 tablet (50 mg total) by mouth every 6 (six) hours as needed. 30 tablet 0  . vitamin B-12 (CYANOCOBALAMIN) 500 MCG tablet Take 500 mcg by mouth daily.     No current facility-administered medications for this visit.    Facility-Administered  Medications Ordered in Other Visits  Medication Dose Route Frequency Provider Last Rate Last Dose  . CARBOplatin (PARAPLATIN) 600 mg in sodium chloride 0.9 % 250 mL chemo infusion  600 mg Intravenous Once Alvy Bimler, Cortnie Ringel, MD      . heparin lock flush 100 unit/mL  500 Units Intracatheter Once PRN Alvy Bimler, Mayerly Kaman, MD      . PACLitaxel (TAXOL) 300 mg in sodium chloride 0.9 % 250 mL chemo infusion (> 80mg /m2)  140 mg/m2 (Treatment Plan Recorded) Intravenous Once Alvy Bimler, Francena Zender, MD 100 mL/hr at 01/27/19 1122 300 mg at 01/27/19 1122  . sodium chloride flush (NS) 0.9 % injection 10 mL  10 mL Intracatheter PRN Alvy Bimler, Veretta Sabourin, MD        PHYSICAL EXAMINATION: ECOG PERFORMANCE STATUS: 1 - Symptomatic but completely ambulatory  Vitals:   01/27/19 0824  BP: 135/75  Pulse: 92  Resp: 18  Temp: 98.2 F (36.8 C)  SpO2: 97%   Filed Weights   01/27/19 0824  Weight: 212 lb 9.6 oz (96.4 kg)    GENERAL:alert, no distress and comfortable SKIN: skin color, texture, turgor are normal, no  rashes or significant lesions EYES: normal, Conjunctiva are pink and non-injected, sclera clear OROPHARYNX:no exudate, no erythema and lips, buccal mucosa, and tongue normal  NECK: supple, thyroid normal size, non-tender, without nodularity LYMPH:  no palpable lymphadenopathy in the cervical, axillary or inguinal LUNGS: clear to auscultation and percussion with normal breathing effort HEART: regular rate & rhythm and no murmurs and no lower extremity edema ABDOMEN:abdomen soft, non-tender and normal bowel sounds Musculoskeletal:no cyanosis of digits and no clubbing  NEURO: alert & oriented x 3 with fluent speech, no focal motor/sensory deficits  LABORATORY DATA:  I have reviewed the data as listed    Component Value Date/Time   NA 139 01/27/2019 0749   K 4.4 01/27/2019 0749   CL 105 01/27/2019 0749   CO2 24 01/27/2019 0749   GLUCOSE 147 (H) 01/27/2019 0749   BUN 24 (H) 01/27/2019 0749   CREATININE 0.99 01/27/2019 0749    CALCIUM 10.2 01/27/2019 0749   PROT 7.8 01/27/2019 0749   ALBUMIN 3.5 01/27/2019 0749   AST 21 01/27/2019 0749   ALT 32 01/27/2019 0749   ALKPHOS 121 01/27/2019 0749   BILITOT 0.2 (L) 01/27/2019 0749   GFRNONAA 56 (L) 01/27/2019 0749   GFRAA >60 01/27/2019 0749    No results found for: SPEP, UPEP  Lab Results  Component Value Date   WBC 5.4 01/27/2019   NEUTROABS 4.3 01/27/2019   HGB 12.5 01/27/2019   HCT 37.5 01/27/2019   MCV 91.0 01/27/2019   PLT 257 01/27/2019      Chemistry      Component Value Date/Time   NA 139 01/27/2019 0749   K 4.4 01/27/2019 0749   CL 105 01/27/2019 0749   CO2 24 01/27/2019 0749   BUN 24 (H) 01/27/2019 0749   CREATININE 0.99 01/27/2019 0749      Component Value Date/Time   CALCIUM 10.2 01/27/2019 0749   ALKPHOS 121 01/27/2019 0749   AST 21 01/27/2019 0749   ALT 32 01/27/2019 0749   BILITOT 0.2 (L) 01/27/2019 0749      All questions were answered. The patient knows to call the clinic with any problems, questions or concerns. No barriers to learning was detected.  I spent 15 minutes counseling the patient face to face. The total time spent in the appointment was 20 minutes and more than 50% was on counseling and review of test results  [name], MD 01/27/2019 2:19 PM

## 2019-01-27 NOTE — Progress Notes (Signed)
Met with patient in treatment room and discussed lab results. Patient advised new thyroid medication and directions. Patient verbalized an understanding of results and admin instructions for levothyroxine.

## 2019-01-27 NOTE — Assessment & Plan Note (Signed)
She has profound fatigue TSH came back high I recommend trial of low-dose thyroid replacement therapy

## 2019-01-27 NOTE — Assessment & Plan Note (Signed)
She tolerated cycle 2 of therapy better except for bone aches Recent tumor marker has improved Her breathing is normal and she denies recent sensation of bloating or changes in bowel habits Clinically, I believe the patient has responded well to treatment We will proceed with cycle 3 of treatment without dose adjustment She has CT scan scheduled for next week for objective assessment of response to therapy If she have excellent response to treatment, she might undergo interval debulking surgery after cycle 3 of therapy.

## 2019-01-27 NOTE — Assessment & Plan Note (Signed)
She has recent exacerbation of bone pain secondary to side effects of chemotherapy I recommend a trial of tramadol We discussed expected risks and side effects and she agreed to proceed

## 2019-01-27 NOTE — Patient Instructions (Signed)
[facility] Discharge Instructions for Patients Receiving Chemotherapy  Today you received the following chemotherapy agents:  Taxol, Carboplatin  To help prevent nausea and vomiting after your treatment, we encourage you to take your nausea medication as prescribed.   If you develop nausea and vomiting that is not controlled by your nausea medication, call the clinic.   BELOW ARE SYMPTOMS THAT SHOULD BE REPORTED IMMEDIATELY:  *FEVER GREATER THAN 100.5 F  *CHILLS WITH OR WITHOUT FEVER  NAUSEA AND VOMITING THAT IS NOT CONTROLLED WITH YOUR NAUSEA MEDICATION  *UNUSUAL SHORTNESS OF BREATH  *UNUSUAL BRUISING OR BLEEDING  TENDERNESS IN MOUTH AND THROAT WITH OR WITHOUT PRESENCE OF ULCERS  *URINARY PROBLEMS  *BOWEL PROBLEMS  UNUSUAL RASH Items with * indicate a potential emergency and should be followed up as soon as possible.  Feel free to call the clinic should you have any questions or concerns. The clinic phone number is [phone].  Please show the CHEMO ALERT CARD at check-in to the Emergency Department and triage nurse.   

## 2019-02-02 ENCOUNTER — Other Ambulatory Visit: Payer: Self-pay

## 2019-02-02 ENCOUNTER — Ambulatory Visit (HOSPITAL_COMMUNITY)
Admission: RE | Admit: 2019-02-02 | Discharge: 2019-02-02 | Disposition: A | Discharge status: Home / Self Care | Payer: Medicare Other | Source: Ambulatory Visit | Attending: Hematology and Oncology | Admitting: Hematology and Oncology

## 2019-02-02 ENCOUNTER — Inpatient Hospital Stay (HOSPITAL_COMMUNITY)
Admission: EM | Admit: 2019-02-02 | Discharge: 2019-02-04 | DRG: 176 | Disposition: A | Discharge status: Home / Self Care | Payer: Medicare Other | Source: Ambulatory Visit | Attending: Student | Admitting: Student

## 2019-02-02 ENCOUNTER — Encounter (HOSPITAL_COMMUNITY): Payer: Self-pay

## 2019-02-02 ENCOUNTER — Inpatient Hospital Stay (HOSPITAL_COMMUNITY): Payer: Medicare Other

## 2019-02-02 ENCOUNTER — Encounter (HOSPITAL_COMMUNITY): Payer: Medicare Other

## 2019-02-02 DIAGNOSIS — N1330 Unspecified hydronephrosis: Secondary | ICD-10-CM | POA: Diagnosis not present

## 2019-02-02 DIAGNOSIS — Z9221 Personal history of antineoplastic chemotherapy: Secondary | ICD-10-CM | POA: Diagnosis not present

## 2019-02-02 DIAGNOSIS — I2699 Other pulmonary embolism without acute cor pulmonale: Secondary | ICD-10-CM | POA: Diagnosis present

## 2019-02-02 DIAGNOSIS — N1339 Other hydronephrosis: Secondary | ICD-10-CM | POA: Diagnosis not present

## 2019-02-02 DIAGNOSIS — Z800 Family history of malignant neoplasm of digestive organs: Secondary | ICD-10-CM

## 2019-02-02 DIAGNOSIS — N135 Crossing vessel and stricture of ureter without hydronephrosis: Secondary | ICD-10-CM | POA: Diagnosis not present

## 2019-02-02 DIAGNOSIS — C569 Malignant neoplasm of unspecified ovary: Secondary | ICD-10-CM | POA: Diagnosis present

## 2019-02-02 DIAGNOSIS — E039 Hypothyroidism, unspecified: Secondary | ICD-10-CM | POA: Diagnosis not present

## 2019-02-02 DIAGNOSIS — I10 Essential (primary) hypertension: Secondary | ICD-10-CM | POA: Diagnosis present

## 2019-02-02 DIAGNOSIS — I351 Nonrheumatic aortic (valve) insufficiency: Secondary | ICD-10-CM

## 2019-02-02 DIAGNOSIS — K219 Gastro-esophageal reflux disease without esophagitis: Secondary | ICD-10-CM | POA: Diagnosis present

## 2019-02-02 DIAGNOSIS — C782 Secondary malignant neoplasm of pleura: Secondary | ICD-10-CM | POA: Diagnosis present

## 2019-02-02 DIAGNOSIS — I82409 Acute embolism and thrombosis of unspecified deep veins of unspecified lower extremity: Secondary | ICD-10-CM

## 2019-02-02 DIAGNOSIS — D638 Anemia in other chronic diseases classified elsewhere: Secondary | ICD-10-CM | POA: Diagnosis not present

## 2019-02-02 DIAGNOSIS — I82461 Acute embolism and thrombosis of right calf muscular vein: Secondary | ICD-10-CM | POA: Diagnosis present

## 2019-02-02 DIAGNOSIS — Z7982 Long term (current) use of aspirin: Secondary | ICD-10-CM

## 2019-02-02 DIAGNOSIS — Z8673 Personal history of transient ischemic attack (TIA), and cerebral infarction without residual deficits: Secondary | ICD-10-CM

## 2019-02-02 DIAGNOSIS — I82451 Acute embolism and thrombosis of right peroneal vein: Secondary | ICD-10-CM | POA: Diagnosis present

## 2019-02-02 DIAGNOSIS — Z79899 Other long term (current) drug therapy: Secondary | ICD-10-CM | POA: Diagnosis not present

## 2019-02-02 DIAGNOSIS — I82431 Acute embolism and thrombosis of right popliteal vein: Secondary | ICD-10-CM | POA: Diagnosis not present

## 2019-02-02 DIAGNOSIS — D696 Thrombocytopenia, unspecified: Secondary | ICD-10-CM | POA: Diagnosis not present

## 2019-02-02 DIAGNOSIS — C786 Secondary malignant neoplasm of retroperitoneum and peritoneum: Secondary | ICD-10-CM | POA: Diagnosis not present

## 2019-02-02 DIAGNOSIS — K5900 Constipation, unspecified: Secondary | ICD-10-CM | POA: Diagnosis not present

## 2019-02-02 DIAGNOSIS — J90 Pleural effusion, not elsewhere classified: Secondary | ICD-10-CM | POA: Diagnosis not present

## 2019-02-02 DIAGNOSIS — D61818 Other pancytopenia: Secondary | ICD-10-CM | POA: Diagnosis present

## 2019-02-02 DIAGNOSIS — I2692 Saddle embolus of pulmonary artery without acute cor pulmonale: Secondary | ICD-10-CM | POA: Diagnosis not present

## 2019-02-02 DIAGNOSIS — I82411 Acute embolism and thrombosis of right femoral vein: Secondary | ICD-10-CM | POA: Diagnosis present

## 2019-02-02 DIAGNOSIS — Z20828 Contact with and (suspected) exposure to other viral communicable diseases: Secondary | ICD-10-CM | POA: Diagnosis present

## 2019-02-02 DIAGNOSIS — Z79890 Hormone replacement therapy (postmenopausal): Secondary | ICD-10-CM | POA: Diagnosis not present

## 2019-02-02 DIAGNOSIS — I82491 Acute embolism and thrombosis of other specified deep vein of right lower extremity: Secondary | ICD-10-CM | POA: Diagnosis not present

## 2019-02-02 DIAGNOSIS — Z90710 Acquired absence of both cervix and uterus: Secondary | ICD-10-CM

## 2019-02-02 DIAGNOSIS — Z86711 Personal history of pulmonary embolism: Secondary | ICD-10-CM

## 2019-02-02 DIAGNOSIS — I82401 Acute embolism and thrombosis of unspecified deep veins of right lower extremity: Secondary | ICD-10-CM | POA: Diagnosis not present

## 2019-02-02 DIAGNOSIS — D61810 Antineoplastic chemotherapy induced pancytopenia: Secondary | ICD-10-CM | POA: Diagnosis not present

## 2019-02-02 HISTORY — DX: Personal history of pulmonary embolism: Z86.711

## 2019-02-02 HISTORY — DX: Acute embolism and thrombosis of unspecified deep veins of unspecified lower extremity: I82.409

## 2019-02-02 LAB — CBC WITH DIFFERENTIAL/PLATELET
Abs Immature Granulocytes: 0.03 10*3/uL (ref 0.00–0.07)
Basophils Absolute: 0 10*3/uL (ref 0.0–0.1)
Basophils Relative: 0 %
Eosinophils Absolute: 0 10*3/uL (ref 0.0–0.5)
Eosinophils Relative: 1 %
HCT: 34.5 % — ABNORMAL LOW (ref 36.0–46.0)
Hemoglobin: 11.4 g/dL — ABNORMAL LOW (ref 12.0–15.0)
Immature Granulocytes: 1 %
Lymphocytes Relative: 44 %
Lymphs Abs: 1.7 10*3/uL (ref 0.7–4.0)
MCH: 30.7 pg (ref 26.0–34.0)
MCHC: 33 g/dL (ref 30.0–36.0)
MCV: 93 fL (ref 80.0–100.0)
Monocytes Absolute: 0.2 10*3/uL (ref 0.1–1.0)
Monocytes Relative: 4 %
Neutro Abs: 2 10*3/uL (ref 1.7–7.7)
Neutrophils Relative %: 50 %
Platelets: 135 10*3/uL — ABNORMAL LOW (ref 150–400)
RBC: 3.71 MIL/uL — ABNORMAL LOW (ref 3.87–5.11)
RDW: 13.9 % (ref 11.5–15.5)
WBC: 3.9 10*3/uL — ABNORMAL LOW (ref 4.0–10.5)
nRBC: 0 % (ref 0.0–0.2)

## 2019-02-02 LAB — ECHOCARDIOGRAM COMPLETE
Height: 67 in
Weight: 3392 oz

## 2019-02-02 LAB — HEPARIN LEVEL (UNFRACTIONATED): Heparin Unfractionated: 0.35 IU/mL (ref 0.30–0.70)

## 2019-02-02 LAB — BASIC METABOLIC PANEL
Anion gap: 10 (ref 5–15)
BUN: 18 mg/dL (ref 8–23)
CO2: 26 mmol/L (ref 22–32)
Calcium: 9.7 mg/dL (ref 8.9–10.3)
Chloride: 99 mmol/L (ref 98–111)
Creatinine, Ser: 0.71 mg/dL (ref 0.44–1.00)
GFR calc Af Amer: >60 mL/min (ref >60)
GFR calc non Af Amer: >60 mL/min (ref >60)
Glucose, Bld: 103 mg/dL — ABNORMAL HIGH (ref 70–99)
Potassium: 3.8 mmol/L (ref 3.5–5.1)
Sodium: 135 mmol/L (ref 135–145)

## 2019-02-02 LAB — SARS CORONAVIRUS 2 BY RT PCR (HOSPITAL ORDER, PERFORMED IN ~~LOC~~ HOSPITAL LAB): SARS Coronavirus 2: NEGATIVE

## 2019-02-02 LAB — MRSA PCR SCREENING: MRSA by PCR: NEGATIVE

## 2019-02-02 MED ORDER — SENNOSIDES-DOCUSATE SODIUM 8.6-50 MG PO TABS
1.0000 | ORAL_TABLET | Freq: Two times a day (BID) | ORAL | Status: DC
  Administered 2019-02-02 – 2019-02-04 (×4): 1 via ORAL
  Filled 2019-02-02 (×5): qty 1

## 2019-02-02 MED ORDER — LEVOTHYROXINE SODIUM 25 MCG PO TABS
25.0000 ug | ORAL_TABLET | Freq: Every day | ORAL | Status: DC
  Administered 2019-02-03 – 2019-02-04 (×2): 25 ug via ORAL
  Filled 2019-02-02 (×2): qty 1

## 2019-02-02 MED ORDER — HEPARIN SODIUM (PORCINE) 5000 UNIT/ML IJ SOLN: 4000.0000 [IU] | Freq: Once | INTRAMUSCULAR | Status: DC

## 2019-02-02 MED ORDER — TRAMADOL HCL 50 MG PO TABS
50.0000 mg | ORAL_TABLET | Freq: Four times a day (QID) | ORAL | Status: DC | PRN
  Administered 2019-02-03: 50 mg via ORAL
  Filled 2019-02-02: qty 1

## 2019-02-02 MED ORDER — SODIUM CHLORIDE (PF) 0.9 % IJ SOLN
INTRAMUSCULAR | Status: AC
  Filled 2019-02-02: qty 50

## 2019-02-02 MED ORDER — HEPARIN (PORCINE) 25000 UT/250ML-% IV SOLN: 14.0000 [IU]/kg/h | INTRAVENOUS | Status: DC

## 2019-02-02 MED ORDER — POLYETHYLENE GLYCOL 3350 17 G PO PACK
17.0000 g | PACK | Freq: Every day | ORAL | Status: DC | PRN
  Administered 2019-02-03: 17:00:00 17 g via ORAL
  Filled 2019-02-02: qty 1

## 2019-02-02 MED ORDER — LIDOCAINE-PRILOCAINE 2.5-2.5 % EX CREA: 1.0000 "application " | TOPICAL_CREAM | CUTANEOUS | Status: DC | PRN

## 2019-02-02 MED ORDER — SODIUM CHLORIDE 0.9 % IV SOLN
INTRAVENOUS | Status: DC
  Administered 2019-02-02: 12:00:00 via INTRAVENOUS

## 2019-02-02 MED ORDER — IOHEXOL 300 MG/ML  SOLN
100.0000 mL | Freq: Once | INTRAMUSCULAR | Status: AC | PRN
  Administered 2019-02-02: 10:00:00 100 mL via INTRAVENOUS

## 2019-02-02 MED ORDER — HEPARIN BOLUS VIA INFUSION
4000.0000 [IU] | Freq: Once | INTRAVENOUS | Status: AC
  Administered 2019-02-02: 4000 [IU] via INTRAVENOUS
  Filled 2019-02-02: qty 4000

## 2019-02-02 MED ORDER — HEPARIN (PORCINE) 25000 UT/250ML-% IV SOLN
1200.0000 [IU]/h | INTRAVENOUS | Status: DC
  Administered 2019-02-02: 1200 [IU]/h via INTRAVENOUS
  Filled 2019-02-02 (×2): qty 250

## 2019-02-02 MED ORDER — SODIUM CHLORIDE 0.9 % IV BOLUS
500.0000 mL | Freq: Once | INTRAVENOUS | Status: AC
  Administered 2019-02-02: 12:00:00 500 mL via INTRAVENOUS

## 2019-02-02 NOTE — Progress Notes (Signed)
[facility] for Heparin Indication: pulmonary embolus  No Known Allergies  Patient Measurements: Height: 5\' 7"  (170.2 cm) Weight: 212 lb (96.2 kg) IBW/kg (Calculated) : 61.6 Heparin Dosing Weight: 82.7kg (TBW 96.2)  Vital Signs: Temp: 97.7 F (36.5 C) (08/06 1112) Temp Source: Oral (08/06 1112) BP: 141/84 (08/06 1200) Pulse Rate: 85 (08/06 1200)  Labs: Recent Labs    02/02/19 1140  HGB 11.4*  HCT 34.5*  PLT 135*   Estimated Creatinine Clearance: 58.4 mL/min (by C-G formula based on SCr of 0.99 mg/dL).  Medical History: Past Medical History:  Diagnosis Date  . Back pain   . Family history of stomach cancer   . Family history of stomach cancer   . GERD (gastroesophageal reflux disease)   . Hypertension   . Pessary maintenance   . Stroke [facility])    Medications:  Scheduled:  . heparin  4,000 Units Intravenous Once   Infusions:  . sodium chloride    . heparin    . sodium chloride      Assessment: 24 yoF with abd CT today for Ca follow-up, [place] PE noted, admit thru ED, begin Heparin   Heparin 4000 unit bolus per MD, infusion at 1200 units/hr, used adj body weight 82.7 kg  Goal of Therapy:  Heparin level 0.3-0.7 units/ml Monitor platelets by anticoagulation protocol: Yes   Plan:   Heparin 4000 unit bolus (per MD)  Heparin infusion at 1200 units/hr (using adj weight - 82.7 kg)  Check 1st heparin level 6 hr after bolus, infusion started (8pm)  Daily CBC while on Heparin, daily Hep level when at steady state   Minda Ditto PharmD Pager [phone] 02/02/2019, 12:20 PM

## 2019-02-02 NOTE — Progress Notes (Signed)
Echocardiogram 2D Echocardiogram has been performed.  Oneal Deputy Nikolaus Pienta 02/02/2019, 2:29 PM

## 2019-02-02 NOTE — ED Notes (Signed)
ED TO INPATIENT HANDOFF REPORT  Name/Age/Gender Paula Peterson 78 y.o. female  Code Status   Home/SNF/Other Home  Chief Complaint blood clott in lungs  Level of Care/Admitting Diagnosis ED Disposition    ED Disposition Condition Comment   Admit  Hospital Area: [facility] [[id number]]  Level of Care: Stepdown [14]  Admit to SDU based on following criteria: Respiratory Distress:  Frequent assessment and/or intervention to maintain adequate ventilation/respiration, pulmonary toilet, and respiratory treatment.  Covid Evaluation: Asymptomatic Screening Protocol (No Symptoms)  Diagnosis: PE (pulmonary thromboembolism) [facility]) [[id number]]  Admitting Physician: Florencia Reasons [[id number]]  Attending Physician: Florencia Reasons [[id number]]  Estimated length of stay: past midnight tomorrow  Certification:: I certify this patient will need inpatient services for at least 2 midnights  PT Class (Do Not Modify): Inpatient [101]  PT Acc Code (Do Not Modify): Private [1]       Medical History Past Medical History:  Diagnosis Date  . Back pain   . Family history of stomach cancer   . Family history of stomach cancer   . GERD (gastroesophageal reflux disease)   . Hypertension   . Pessary maintenance   . Stroke [facility])     Allergies No Known Allergies  IV Location/Drains/Wounds Patient Lines/Drains/Airways Status   Active Line/Drains/Airways    Name:   Placement date:   Placement time:   Site:   Days:   Implanted Port 12/15/18 Right Chest   12/15/18    1110    Chest   49   Peripheral IV 02/02/19 Anterior;Distal;Right Forearm   02/02/19    1018    Forearm   less than 1          Labs/Imaging Results for orders placed or performed during the hospital encounter of 02/02/19 (from the past 48 hour(s))  Basic metabolic panel     Status: Abnormal   Collection Time: 02/02/19 11:40 AM  Result Value Ref Range   Sodium 135 135 - 145 mmol/L   Potassium 3.8 3.5 - 5.1 mmol/L   Chloride 99 98 -  111 mmol/L   CO2 26 22 - 32 mmol/L   Glucose, Bld 103 (H) 70 - 99 mg/dL   BUN 18 8 - 23 mg/dL   Creatinine, Ser 0.71 0.44 - 1.00 mg/dL   Calcium 9.7 8.9 - 10.3 mg/dL   GFR calc non Af Amer >60 >60 mL/min   GFR calc Af Amer >60 >60 mL/min   Anion gap 10 5 - 15    Comment: Performed at [facility], [place] [address] [place], [place] [address]  CBC with Differential/Platelet     Status: Abnormal   Collection Time: 02/02/19 11:40 AM  Result Value Ref Range   WBC 3.9 (L) 4.0 - 10.5 K/uL   RBC 3.71 (L) 3.87 - 5.11 MIL/uL   Hemoglobin 11.4 (L) 12.0 - 15.0 g/dL   HCT 34.5 (L) 36.0 - 46.0 %   MCV 93.0 80.0 - 100.0 fL   MCH 30.7 26.0 - 34.0 pg   MCHC 33.0 30.0 - 36.0 g/dL   RDW 13.9 11.5 - 15.5 %   Platelets 135 (L) 150 - 400 K/uL   nRBC 0.0 0.0 - 0.2 %   Neutrophils Relative % 50 %   Neutro Abs 2.0 1.7 - 7.7 K/uL   Lymphocytes Relative 44 %   Lymphs Abs 1.7 0.7 - 4.0 K/uL   Monocytes Relative 4 %   Monocytes Absolute 0.2 0.1 - 1.0 K/uL  Eosinophils Relative 1 %   Eosinophils Absolute 0.0 0.0 - 0.5 K/uL   Basophils Relative 0 %   Basophils Absolute 0.0 0.0 - 0.1 K/uL   Immature Granulocytes 1 %   Abs Immature Granulocytes 0.03 0.00 - 0.07 K/uL    Comment: Performed at [facility], [place] [address], [place], [place] [address]   Ct Chest W Contrast  Result Date: 02/02/2019 CLINICAL DATA:  78 year old female with 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 Follow-up evaluation. EXAM: CT CHEST, ABDOMEN, AND PELVIS WITH CONTRAST TECHNIQUE: Multidetector CT imaging of the chest, abdomen and pelvis was performed following the standard protocol during bolus administration of intravenous contrast. CONTRAST:  175mL OMNIPAQUE IOHEXOL 300 MG/ML  SOLN COMPARISON:  Chest CT 12/12/2018. FINDINGS: CT CHEST FINDINGS Cardiovascular: Heart size is normal. There is no significant pericardial fluid,  thickening or pericardial calcification. There is aortic atherosclerosis, as well as atherosclerosis of the great vessels of the mediastinum and the coronary arteries, including calcified atherosclerotic plaque in the left anterior descending, left circumflex and right coronary arteries. Filling defects are noted in the pulmonary arterial tree bilaterally, indicative of widespread pulmonary embolus. This includes saddle embolus, main, lobar, segmental and subsegmental sized emboli bilaterally. The largest burden of clot appears in the right lower lobe pulmonary arterial tree where there is a combination of occlusive and nonocclusive thrombus. Right internal jugular single-lumen porta cath with tip terminating in the superior aspect of the right atrium. Right ventricle measures approximately 4.2 cm in diameter. Left ventricle measures approximately 4.4 cm in diameter. RV to LV ratio of 0.95. Mediastinum/Nodes: No pathologically enlarged mediastinal or hilar lymph nodes. Esophagus is unremarkable in appearance. No axillary lymphadenopathy. Lungs/Pleura: Multiple small predominantly pleural-based nodules are again noted throughout the lungs bilaterally, similar in size and number to the prior examination, nonspecific, but favored to be benign. No other larger more suspicious appearing pulmonary nodules or masses are noted. No acute consolidative airspace disease. Resolution of previously noted left pleural effusion. Small right pleural effusion has decreased in size compared to the prior study. Musculoskeletal: There are no aggressive appearing lytic or blastic lesions noted in the visualized portions of the skeleton. CT ABDOMEN PELVIS FINDINGS Hepatobiliary: No suspicious cystic or solid hepatic lesions. No intra or extrahepatic biliary ductal dilatation. Gallbladder is normal in appearance. Pancreas: No pancreatic mass. No pancreatic ductal dilatation. No pancreatic or peripancreatic fluid collections or inflammatory  changes are noted. Spleen: Unremarkable. Adrenals/Urinary Tract: Multiple low-attenuation lesions in the left renal hilum, most compatible with small parapelvic cysts. No suspicious right renal lesions. Mild right hydroureteronephrosis, new compared to the prior study, which appears related to external compression on the distal third of the right ureter by the patient's large pelvic mass (discussed below). No left hydroureteronephrosis. Urinary bladder is unremarkable in appearance. Bilateral adrenal glands are normal in appearance. Stomach/Bowel: Normal appearance of the stomach. No pathologic dilatation of small bowel or colon. Appendix is not confidently identified may be surgically absent or atrophic. Vascular/Lymphatic: Aortic atherosclerosis, without evidence of aneurysm or dissection in the abdominal or pelvic vasculature. Filling defect in the right superficial femoral vein incompletely imaged (axial image 137 of series 2), compatible with deep venous thrombosis. No lymphadenopathy noted in the abdomen or pelvis. Reproductive: Status post hysterectomy. Pessary in the vagina. Large cystic mass in the central aspect of the pelvis which has increased in size compared to the prior examination, largest of which measures 15.7 x 11.0 x  12.4 cm (axial image 108 of series 2 and coronal image 70 of series 5), previously 13.4 x 9.8 cm on 11/29/2018. Other: There continues to be scattered areas of abnormal soft tissue throughout the peritoneal cavity, indicative of widespread intraperitoneal metastatic disease, bulkiest portion of which is visualized on axial image 94 of series 2, overall decreased in size compared to the prior study. Trace ascites in the low anatomic pelvis. No pneumoperitoneum. Musculoskeletal: There are no aggressive appearing lytic or blastic lesions noted in the visualized portions of the skeleton. IMPRESSION: 1. Massive pulmonary embolism, as discussed above. Given the mildly elevated RV to LV  ratio of 0.95, this is associated with increased risk of morbidity and mortality. 2. Today's study demonstrates a mixed response to therapy. Specifically, while there has been regression of the bulky intraperitoneal metastatic disease and regression of previously noted pleural effusions, the large cystic mass in the central pelvis has increased in size compared to the prior study. 3. New right mild hydroureteronephrosis related to extrinsic compression on the distal third of the right ureter by the patient's large pelvic mass. 4. Scattered small pulmonary nodules (predominantly pleural based) appear stable compared to prior examinations. These are nonspecific but warrant continued attention on follow-up studies. 5. Aortic atherosclerosis, in addition to three-vessel coronary artery disease. Assessment for potential risk factor modification, dietary therapy or pharmacologic therapy may be warranted, if clinically indicated. 6. Additional incidental findings, as above. Critical Value/emergent results were called by telephone at the time of interpretation on 02/02/2019 at 10:36 a.m. to Dr. [name] , who verbally acknowledged these results. Electronically Signed   By: Vinnie Langton M.D.   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 Follow-up evaluation. EXAM: CT CHEST, ABDOMEN, AND PELVIS WITH CONTRAST TECHNIQUE: Multidetector CT imaging of the chest, abdomen and pelvis was performed following the standard protocol during bolus administration of intravenous contrast. CONTRAST:  152mL OMNIPAQUE IOHEXOL 300 MG/ML  SOLN COMPARISON:  Chest CT 12/12/2018. FINDINGS: CT CHEST FINDINGS Cardiovascular: Heart size is normal. There is no significant pericardial fluid, thickening or pericardial calcification. There is aortic  atherosclerosis, as well as atherosclerosis of the great vessels of the mediastinum and the coronary arteries, including calcified atherosclerotic plaque in the left anterior descending, left circumflex and right coronary arteries. Filling defects are noted in the pulmonary arterial tree bilaterally, indicative of widespread pulmonary embolus. This includes saddle embolus, main, lobar, segmental and subsegmental sized emboli bilaterally. The largest burden of clot appears in the right lower lobe pulmonary arterial tree where there is a combination of occlusive and nonocclusive thrombus. Right internal jugular single-lumen porta cath with tip terminating in the superior aspect of the right atrium. Right ventricle measures approximately 4.2 cm in diameter. Left ventricle measures approximately 4.4 cm in diameter. RV to LV ratio of 0.95. Mediastinum/Nodes: No pathologically enlarged mediastinal or hilar lymph nodes. Esophagus is unremarkable in appearance. No axillary lymphadenopathy. Lungs/Pleura: Multiple small predominantly pleural-based nodules are again noted throughout the lungs bilaterally, similar in size and number to the prior examination, nonspecific, but favored to be benign. No other larger more suspicious appearing pulmonary nodules or masses are noted. No acute consolidative airspace disease. Resolution of previously noted left pleural effusion. Small right pleural effusion has decreased in size compared to the prior study. Musculoskeletal: There are no aggressive appearing lytic or blastic lesions  noted in the visualized portions of the skeleton. CT ABDOMEN PELVIS FINDINGS Hepatobiliary: No suspicious cystic or solid hepatic lesions. No intra or extrahepatic biliary ductal dilatation. Gallbladder is normal in appearance. Pancreas: No pancreatic mass. No pancreatic ductal dilatation. No pancreatic or peripancreatic fluid collections or inflammatory changes are noted. Spleen: Unremarkable.  Adrenals/Urinary Tract: Multiple low-attenuation lesions in the left renal hilum, most compatible with small parapelvic cysts. No suspicious right renal lesions. Mild right hydroureteronephrosis, new compared to the prior study, which appears related to external compression on the distal third of the right ureter by the patient's large pelvic mass (discussed below). No left hydroureteronephrosis. Urinary bladder is unremarkable in appearance. Bilateral adrenal glands are normal in appearance. Stomach/Bowel: Normal appearance of the stomach. No pathologic dilatation of small bowel or colon. Appendix is not confidently identified may be surgically absent or atrophic. Vascular/Lymphatic: Aortic atherosclerosis, without evidence of aneurysm or dissection in the abdominal or pelvic vasculature. Filling defect in the right superficial femoral vein incompletely imaged (axial image 137 of series 2), compatible with deep venous thrombosis. No lymphadenopathy noted in the abdomen or pelvis. Reproductive: Status post hysterectomy. Pessary in the vagina. Large cystic mass in the central aspect of the pelvis which has increased in size compared to the prior examination, largest of which measures 15.7 x 11.0 x 12.4 cm (axial image 108 of series 2 and coronal image 70 of series 5), previously 13.4 x 9.8 cm on 11/29/2018. Other: There continues to be scattered areas of abnormal soft tissue throughout the peritoneal cavity, indicative of widespread intraperitoneal metastatic disease, bulkiest portion of which is visualized on axial image 94 of series 2, overall decreased in size compared to the prior study. Trace ascites in the low anatomic pelvis. No pneumoperitoneum. Musculoskeletal: There are no aggressive appearing lytic or blastic lesions noted in the visualized portions of the skeleton. IMPRESSION: 1. Massive pulmonary embolism, as discussed above. Given the mildly elevated RV to LV ratio of 0.95, this is associated with  increased risk of morbidity and mortality. 2. Today's study demonstrates a mixed response to therapy. Specifically, while there has been regression of the bulky intraperitoneal metastatic disease and regression of previously noted pleural effusions, the large cystic mass in the central pelvis has increased in size compared to the prior study. 3. New right mild hydroureteronephrosis related to extrinsic compression on the distal third of the right ureter by the patient's large pelvic mass. 4. Scattered small pulmonary nodules (predominantly pleural based) appear stable compared to prior examinations. These are nonspecific but warrant continued attention on follow-up studies. 5. Aortic atherosclerosis, in addition to three-vessel coronary artery disease. Assessment for potential risk factor modification, dietary therapy or pharmacologic therapy may be warranted, if clinically indicated. 6. Additional incidental findings, as above. Critical Value/emergent results were called by telephone at the time of interpretation on 02/02/2019 at 10:36 a.m. to Dr. [name] , who verbally acknowledged these results. Electronically Signed   By: Vinnie Langton M.D.   On: 02/02/2019 11:33    Pending Labs Unresulted Labs (From admission, onward)    Start     Ordered   02/03/19 0500  CBC  Daily,   R     02/02/19 1235   02/02/19 2000  Heparin level (unfractionated)  Once-Timed,   STAT     02/02/19 1235   02/02/19 1147  SARS Coronavirus 2 [facility] order, Performed in [facility] hospital lab) Nasopharyngeal Nasopharyngeal Swab  (Symptomatic/High Risk of Exposure/Tier 1 Patients Labs with Precautions)  Once,  STAT    Question Answer Comment  Is this test for diagnosis or screening Diagnosis of ill patient   Symptomatic for COVID-19 as defined by CDC Yes   Date of Symptom Onset 01/26/2019   Hospitalized for COVID-19 No   Admitted to ICU for COVID-19 No   Previously tested for COVID-19 No   Resident in a congregate  (group) care setting No   Employed in healthcare setting No   Pregnant No      02/02/19 1148          Vitals/Pain Today's Vitals   02/02/19 1200 02/02/19 1234 02/02/19 1245 02/02/19 1300  BP: (!) 141/84 (!) 151/83 (!) 144/75 (!) 144/67  Pulse: 85 87 84 83  Resp: 17 (!) 23 14 19   Temp:      TempSrc:      SpO2: 97% 95% 99% 98%  Weight:      Height:        Isolation Precautions Airborne and Contact precautions  Medications Medications  0.9 %  sodium chloride infusion ( Intravenous New Bag/Given 02/02/19 1222)  heparin ADULT infusion 100 units/mL (25000 units/221mL sodium chloride 0.45%) (1,200 Units/hr Intravenous New Bag/Given 02/02/19 1224)  sodium chloride 0.9 % bolus 500 mL (0 mLs Intravenous Stopped 02/02/19 1303)  heparin bolus via infusion 4,000 Units (4,000 Units Intravenous Bolus from Bag 02/02/19 1233)    Mobility walks with person assist

## 2019-02-02 NOTE — ED Triage Notes (Signed)
Patient is cancer patient, getting chemotherapy for ovarian cancer. Patient reports having scheduled CT of abdomen/pelvis today, and saddle PE was noted on CT.Patient brought straight from radiology to ED. Patient does endorse SOB, but reports she has been short of breath "for a month now." Patient breathless with exertion, but denies dyspnea. Patient reports her legs "feel achy, but I dont have any swelling." Patient denies blood thinners. Patient reports she did not take her antihypertensive this morning, as she planned to take it after her CT scan.   22g right forearm PIV placed by radiology in place.

## 2019-02-02 NOTE — Progress Notes (Signed)
ANTICOAGULATION CONSULT NOTE   Pharmacy Consult for Heparin Indication: pulmonary embolus  No Known Allergies  Patient Measurements: Height: 5\' 7"  (170.2 cm) Weight: 212 lb (96.2 kg) IBW/kg (Calculated) : 61.6 Heparin Dosing Weight: 82.7kg (TBW 96.2)  Vital Signs: Temp: 99 F (37.2 C) (08/06 1943) Temp Source: Oral (08/06 1943) BP: 144/75 (08/06 1600) Pulse Rate: 98 (08/06 1600)  Labs: Recent Labs    02/02/19 1140 02/02/19 2030  HGB 11.4*  --   HCT 34.5*  --   PLT 135*  --   HEPARINUNFRC  --  0.35  CREATININE 0.71  --    Estimated Creatinine Clearance: 72.3 mL/min (by C-G formula based on SCr of 0.71 mg/dL).  Medical History: Past Medical History:  Diagnosis Date  . Back pain   . Family history of stomach cancer   . Family history of stomach cancer   . GERD (gastroesophageal reflux disease)   . Hypertension   . Pessary maintenance   . Stroke [facility])    Medications:  Scheduled:  . senna-docusate  1 tablet Oral BID   Infusions:  . sodium chloride 75 mL/hr at 02/02/19 1222  . heparin 1,200 Units/hr (02/02/19 1600)    Assessment: Paula Peterson with abd CT today for Ca follow-up, Saddle PE noted, admit thru ED, begin Heparin   1st HL 0.35, therapeutic  No bleeding or other issues per RN  Goal of Therapy:  Heparin level 0.3-0.7 units/ml Monitor platelets by anticoagulation protocol: Yes   Plan:    Continue Heparin infusion at 1200 units/hr (using adj weight - 82.7 kg)  Check heparin level 6 hr   Daily CBC while on Heparin, daily Hep level when at steady state   [employer] 02/02/2019, 9:03 PM Pager [phone]

## 2019-02-02 NOTE — ED Provider Notes (Addendum)
[facility] DEPT Provider Note   CSN: [id number] Arrival date & time: 02/02/19  1058     History   Chief Complaint Chief Complaint  Patient presents with  . Shortness of Breath    HPI Paula Peterson is a 78 y.o. female.     Patient sent in following CT chest CT abdomen pelvis that was done with contrast for routine staging for her metastatic ovarian cancer.  Her hematologist oncologist is Dr. Simeon Craft such.  The CT of the chest noted bilateral significant pulmonary embolus load.  With questionable saddle.  And mild RV strain.  But patient completely asymptomatic.  Oxygen saturations are in the upper 90s on room air.  Not tachycardic.  No leg swelling.  This was an incidental finding.     Past Medical History:  Diagnosis Date  . Back pain   . Family history of stomach cancer   . Family history of stomach cancer   . GERD (gastroesophageal reflux disease)   . Hypertension   . Pessary maintenance   . Stroke [facility])     Patient Active Problem List   Diagnosis Date Noted  . PE (pulmonary thromboembolism) ([place]) 02/02/2019  . Chronic hip pain 01/27/2019  . Acquired hypothyroidism 01/27/2019  . Family history of stomach cancer   . Goals of care, counseling/discussion 01/06/2019  . Elevated liver enzymes 01/06/2019  . Other fatigue 01/06/2019  . Other constipation 12/07/2018  . Ovarian cancer ([place]) 12/06/2018  . Bilateral pleural effusion 12/06/2018    Past Surgical History:  Procedure Laterality Date  . ABDOMINAL HYSTERECTOMY    . BACK SURGERY    . FRACTURE SURGERY    . IR IMAGING GUIDED PORT INSERTION  12/15/2018     OB History   No obstetric history on file.      Home Medications    Prior to Admission medications   Medication Sig Start Date End Date Taking? Authorizing Provider  amLODipine (NORVASC) 2.5 MG tablet Take 2.5 mg by mouth daily.   Yes [provider]  dexamethasone (DECADRON) 4 MG tablet Take 2 tabs at the night  before and 2 tab the morning of chemotherapy, every 3 weeks, by mouth with food Patient taking differently: Take 8 mg by mouth See admin instructions. Take 8 mg by mouth at night before and 8 mg the morning of chemotherapy, every 3 weeks, by mouth with food 12/07/18  Yes [name], MD  levothyroxine (SYNTHROID) 25 MCG tablet Take 1 tablet (25 mcg total) by mouth daily before breakfast. 01/27/19  Yes Gorsuch, Ni, MD  lidocaine-prilocaine (EMLA) cream Apply to affected area once Patient taking differently: Apply 1 application topically as needed (port access).  12/07/18  Yes Gorsuch, Ni, MD  losartan (COZAAR) 50 MG tablet Take 50 mg by mouth daily.   Yes [provider]  omeprazole (PRILOSEC) 20 MG capsule Take 20 mg by mouth daily.   Yes [provider]  ondansetron (ZOFRAN) 8 MG tablet Take 1 tablet (8 mg total) by mouth every 8 (eight) hours as needed for refractory nausea / vomiting. 12/07/18  Yes Gorsuch, Ni, MD  polyethylene glycol (MIRALAX / GLYCOLAX) 17 g packet Take 17 g by mouth daily as needed for mild constipation.    Yes [provider]  prochlorperazine (COMPAZINE) 10 MG tablet Take 1 tablet (10 mg total) by mouth every 6 (six) hours as needed (Nausea or vomiting). 12/07/18  Yes [name], MD  traMADol (ULTRAM) 50 MG tablet Take 1  tablet (50 mg total) by mouth every 6 (six) hours as needed. Patient taking differently: Take 50 mg by mouth every 6 (six) hours as needed for moderate pain.  01/27/19  Yes [name], MD    Family History Family History  Problem Relation Age of Onset  . Stomach cancer Sister        dx 46s-50s  . Dementia Mother   . Cancer Sister        unk type, hysterectomy, dx 69s    Social History Social History   Tobacco Use  . Smoking status: Never Smoker  . Smokeless tobacco: Never Used  Substance Use Topics  . Alcohol use: No  . Drug use: No     Allergies   Patient has no known allergies.   Review of Systems Review of  Systems  Constitutional: Positive for fatigue. Negative for chills and fever.  HENT: Negative for congestion, rhinorrhea and sore throat.   Eyes: Negative for visual disturbance.  Respiratory: Positive for shortness of breath. Negative for cough.   Cardiovascular: Negative for chest pain and leg swelling.  Gastrointestinal: Negative for abdominal pain, diarrhea, nausea and vomiting.  Genitourinary: Negative for dysuria.  Musculoskeletal: Negative for back pain and neck pain.  Skin: Negative for rash.  Neurological: Negative for dizziness, light-headedness and headaches.  Hematological: Does not bruise/bleed easily.  Psychiatric/Behavioral: Negative for confusion.     Physical Exam Updated Vital Signs BP (!) 141/84   Pulse 85   Temp 97.7 F (36.5 C) (Oral)   Resp 17   Ht 1.702 m (5\' 7" )   Wt 96.2 kg   SpO2 97%   BMI 33.20 kg/m   Physical Exam Vitals signs and nursing note reviewed.  Constitutional:      General: She is not in acute distress.    Appearance: Normal appearance. She is well-developed.  HENT:     Head: Normocephalic and atraumatic.  Eyes:     Extraocular Movements: Extraocular movements intact.     Conjunctiva/sclera: Conjunctivae normal.     Pupils: Pupils are equal, round, and reactive to light.  Neck:     Musculoskeletal: Normal range of motion and neck supple.  Cardiovascular:     Rate and Rhythm: Normal rate and regular rhythm.     Heart sounds: No murmur.  Pulmonary:     Effort: Pulmonary effort is normal. No respiratory distress.     Breath sounds: Normal breath sounds.  Abdominal:     Palpations: Abdomen is soft.     Tenderness: There is no abdominal tenderness.  Musculoskeletal: Normal range of motion.        General: No swelling.  Skin:    General: Skin is warm and dry.  Neurological:     General: No focal deficit present.     Mental Status: She is alert and oriented to person, place, and time.      ED Treatments / Results   Labs (all labs ordered are listed, but only abnormal results are displayed) Labs Reviewed  BASIC METABOLIC PANEL - Abnormal; Notable for the following components:      Result Value   Glucose, Bld 103 (*)    All other components within normal limits  CBC WITH DIFFERENTIAL/PLATELET - Abnormal; Notable for the following components:   WBC 3.9 (*)    RBC 3.71 (*)    Hemoglobin 11.4 (*)    HCT 34.5 (*)    Platelets 135 (*)    All other components within normal limits  SARS CORONAVIRUS 2 (HOSPITAL ORDER, [facility])  HEPARIN LEVEL (UNFRACTIONATED)    EKG EKG Interpretation  Date/Time:  Thursday February 02 2019 11:12:18 EDT Ventricular Rate:  97 PR Interval:    QRS Duration: 88 QT Interval:  362 QTC Calculation: 460 R Axis:   -32 Text Interpretation:  Sinus rhythm Left axis deviation Low voltage, precordial leads Consider anterior infarct Artifact Confirmed by Fredia Sorrow [id number]) on 02/02/2019 11:51:27 AM   Radiology Ct Chest W Contrast  Result Date: 02/02/2019 CLINICAL DATA:  78 year old female with 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 Follow-up evaluation. EXAM: CT CHEST, ABDOMEN, AND PELVIS WITH CONTRAST TECHNIQUE: Multidetector CT imaging of the chest, abdomen and pelvis was performed following the standard protocol during bolus administration of intravenous contrast. CONTRAST:  183mL OMNIPAQUE IOHEXOL 300 MG/ML  SOLN COMPARISON:  Chest CT 12/12/2018. FINDINGS: CT CHEST FINDINGS Cardiovascular: Heart size is normal. There is no significant pericardial fluid, thickening or pericardial calcification. There is aortic atherosclerosis, as well as atherosclerosis of the great vessels of the mediastinum and the coronary arteries, including calcified atherosclerotic plaque in the left anterior descending, left circumflex and right coronary arteries. Filling defects are noted in the  pulmonary arterial tree bilaterally, indicative of widespread pulmonary embolus. This includes saddle embolus, main, lobar, segmental and subsegmental sized emboli bilaterally. The largest burden of clot appears in the right lower lobe pulmonary arterial tree where there is a combination of occlusive and nonocclusive thrombus. Right internal jugular single-lumen porta cath with tip terminating in the superior aspect of the right atrium. Right ventricle measures approximately 4.2 cm in diameter. Left ventricle measures approximately 4.4 cm in diameter. RV to LV ratio of 0.95. Mediastinum/Nodes: No pathologically enlarged mediastinal or hilar lymph nodes. Esophagus is unremarkable in appearance. No axillary lymphadenopathy. Lungs/Pleura: Multiple small predominantly pleural-based nodules are again noted throughout the lungs bilaterally, similar in size and number to the prior examination, nonspecific, but favored to be benign. No other larger more suspicious appearing pulmonary nodules or masses are noted. No acute consolidative airspace disease. Resolution of previously noted left pleural effusion. Small right pleural effusion has decreased in size compared to the prior study. Musculoskeletal: There are no aggressive appearing lytic or blastic lesions noted in the visualized portions of the skeleton. CT ABDOMEN PELVIS FINDINGS Hepatobiliary: No suspicious cystic or solid hepatic lesions. No intra or extrahepatic biliary ductal dilatation. Gallbladder is normal in appearance. Pancreas: No pancreatic mass. No pancreatic ductal dilatation. No pancreatic or peripancreatic fluid collections or inflammatory changes are noted. Spleen: Unremarkable. Adrenals/Urinary Tract: Multiple low-attenuation lesions in the left renal hilum, most compatible with small parapelvic cysts. No suspicious right renal lesions. Mild right hydroureteronephrosis, new compared to the prior study, which appears related to external compression on  the distal third of the right ureter by the patient's large pelvic mass (discussed below). No left hydroureteronephrosis. Urinary bladder is unremarkable in appearance. Bilateral adrenal glands are normal in appearance. Stomach/Bowel: Normal appearance of the stomach. No pathologic dilatation of small bowel or colon. Appendix is not confidently identified may be surgically absent or atrophic. Vascular/Lymphatic: Aortic atherosclerosis, without evidence of aneurysm or dissection in the abdominal or pelvic vasculature. Filling defect in the right superficial femoral vein incompletely imaged (axial image 137 of series 2), compatible with deep venous thrombosis. No lymphadenopathy noted in the abdomen or pelvis. Reproductive: Status post hysterectomy. Pessary in the vagina. Large cystic mass in the central aspect of the  pelvis which has increased in size compared to the prior examination, largest of which measures 15.7 x 11.0 x 12.4 cm (axial image 108 of series 2 and coronal image 70 of series 5), previously 13.4 x 9.8 cm on 11/29/2018. Other: There continues to be scattered areas of abnormal soft tissue throughout the peritoneal cavity, indicative of widespread intraperitoneal metastatic disease, bulkiest portion of which is visualized on axial image 94 of series 2, overall decreased in size compared to the prior study. Trace ascites in the low anatomic pelvis. No pneumoperitoneum. Musculoskeletal: There are no aggressive appearing lytic or blastic lesions noted in the visualized portions of the skeleton. IMPRESSION: 1. Massive pulmonary embolism, as discussed above. Given the mildly elevated RV to LV ratio of 0.95, this is associated with increased risk of morbidity and mortality. 2. Today's study demonstrates a mixed response to therapy. Specifically, while there has been regression of the bulky intraperitoneal metastatic disease and regression of previously noted pleural effusions, the large cystic mass in the  central pelvis has increased in size compared to the prior study. 3. New right mild hydroureteronephrosis related to extrinsic compression on the distal third of the right ureter by the patient's large pelvic mass. 4. Scattered small pulmonary nodules (predominantly pleural based) appear stable compared to prior examinations. These are nonspecific but warrant continued attention on follow-up studies. 5. Aortic atherosclerosis, in addition to three-vessel coronary artery disease. Assessment for potential risk factor modification, dietary therapy or pharmacologic therapy may be warranted, if clinically indicated. 6. Additional incidental findings, as above. Critical Value/emergent results were called by telephone at the time of interpretation on 02/02/2019 at 10:36 a.m. to Dr. [name] , who verbally acknowledged these results. Electronically Signed   By: Vinnie Langton M.D.   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 Follow-up evaluation. EXAM: CT CHEST, ABDOMEN, AND PELVIS WITH CONTRAST TECHNIQUE: Multidetector CT imaging of the chest, abdomen and pelvis was performed following the standard protocol during bolus administration of intravenous contrast. CONTRAST:  117mL OMNIPAQUE IOHEXOL 300 MG/ML  SOLN COMPARISON:  Chest CT 12/12/2018. FINDINGS: CT CHEST FINDINGS Cardiovascular: Heart size is normal. There is no significant pericardial fluid, thickening or pericardial calcification. There is aortic atherosclerosis, as well as atherosclerosis of the great vessels of the mediastinum and the coronary arteries, including calcified atherosclerotic plaque in the left anterior descending, left circumflex and right coronary arteries. Filling defects are noted in the pulmonary arterial tree bilaterally, indicative of widespread  pulmonary embolus. This includes saddle embolus, main, lobar, segmental and subsegmental sized emboli bilaterally. The largest burden of clot appears in the right lower lobe pulmonary arterial tree where there is a combination of occlusive and nonocclusive thrombus. Right internal jugular single-lumen porta cath with tip terminating in the superior aspect of the right atrium. Right ventricle measures approximately 4.2 cm in diameter. Left ventricle measures approximately 4.4 cm in diameter. RV to LV ratio of 0.95. Mediastinum/Nodes: No pathologically enlarged mediastinal or hilar lymph nodes. Esophagus is unremarkable in appearance. No axillary lymphadenopathy. Lungs/Pleura: Multiple small predominantly pleural-based nodules are again noted throughout the lungs bilaterally, similar in size and number to the prior examination, nonspecific, but favored to be benign. No other larger more suspicious appearing pulmonary nodules or masses are noted. No acute consolidative airspace disease. Resolution of previously noted left pleural effusion. Small right pleural effusion  has decreased in size compared to the prior study. Musculoskeletal: There are no aggressive appearing lytic or blastic lesions noted in the visualized portions of the skeleton. CT ABDOMEN PELVIS FINDINGS Hepatobiliary: No suspicious cystic or solid hepatic lesions. No intra or extrahepatic biliary ductal dilatation. Gallbladder is normal in appearance. Pancreas: No pancreatic mass. No pancreatic ductal dilatation. No pancreatic or peripancreatic fluid collections or inflammatory changes are noted. Spleen: Unremarkable. Adrenals/Urinary Tract: Multiple low-attenuation lesions in the left renal hilum, most compatible with small parapelvic cysts. No suspicious right renal lesions. Mild right hydroureteronephrosis, new compared to the prior study, which appears related to external compression on the distal third of the right ureter by the patient's large  pelvic mass (discussed below). No left hydroureteronephrosis. Urinary bladder is unremarkable in appearance. Bilateral adrenal glands are normal in appearance. Stomach/Bowel: Normal appearance of the stomach. No pathologic dilatation of small bowel or colon. Appendix is not confidently identified may be surgically absent or atrophic. Vascular/Lymphatic: Aortic atherosclerosis, without evidence of aneurysm or dissection in the abdominal or pelvic vasculature. Filling defect in the right superficial femoral vein incompletely imaged (axial image 137 of series 2), compatible with deep venous thrombosis. No lymphadenopathy noted in the abdomen or pelvis. Reproductive: Status post hysterectomy. Pessary in the vagina. Large cystic mass in the central aspect of the pelvis which has increased in size compared to the prior examination, largest of which measures 15.7 x 11.0 x 12.4 cm (axial image 108 of series 2 and coronal image 70 of series 5), previously 13.4 x 9.8 cm on 11/29/2018. Other: There continues to be scattered areas of abnormal soft tissue throughout the peritoneal cavity, indicative of widespread intraperitoneal metastatic disease, bulkiest portion of which is visualized on axial image 94 of series 2, overall decreased in size compared to the prior study. Trace ascites in the low anatomic pelvis. No pneumoperitoneum. Musculoskeletal: There are no aggressive appearing lytic or blastic lesions noted in the visualized portions of the skeleton. IMPRESSION: 1. Massive pulmonary embolism, as discussed above. Given the mildly elevated RV to LV ratio of 0.95, this is associated with increased risk of morbidity and mortality. 2. Today's study demonstrates a mixed response to therapy. Specifically, while there has been regression of the bulky intraperitoneal metastatic disease and regression of previously noted pleural effusions, the large cystic mass in the central pelvis has increased in size compared to the prior  study. 3. New right mild hydroureteronephrosis related to extrinsic compression on the distal third of the right ureter by the patient's large pelvic mass. 4. Scattered small pulmonary nodules (predominantly pleural based) appear stable compared to prior examinations. These are nonspecific but warrant continued attention on follow-up studies. 5. Aortic atherosclerosis, in addition to three-vessel coronary artery disease. Assessment for potential risk factor modification, dietary therapy or pharmacologic therapy may be warranted, if clinically indicated. 6. Additional incidental findings, as above. Critical Value/emergent results were called by telephone at the time of interpretation on 02/02/2019 at 10:36 a.m. to Dr. [name] , who verbally acknowledged these results. Electronically Signed   By: Vinnie Langton M.D.   On: 02/02/2019 11:33    Procedures Procedures (including critical care time)  CRITICAL CARE Performed by: Fredia Sorrow Total critical care time: 30 minutes Critical care time was exclusive of separately billable procedures and treating other patients. Critical care was necessary to treat or prevent imminent or life-threatening deterioration. Critical care was time spent personally by me on the following activities: development of treatment plan with patient and/or surrogate  as well as nursing, discussions with consultants, evaluation of patient's response to treatment, examination of patient, obtaining history from patient or surrogate, ordering and performing treatments and interventions, ordering and review of laboratory studies, ordering and review of radiographic studies, pulse oximetry and re-evaluation of patient's condition.   Medications Ordered in ED Medications  0.9 %  sodium chloride infusion ( Intravenous New Bag/Given 02/02/19 1222)  heparin ADULT infusion 100 units/mL (25000 units/229mL sodium chloride 0.45%) (1,200 Units/hr Intravenous New Bag/Given 02/02/19 1224)   sodium chloride 0.9 % bolus 500 mL (500 mLs Intravenous New Bag/Given 02/02/19 1223)  heparin bolus via infusion 4,000 Units (4,000 Units Intravenous Bolus from Bag 02/02/19 1233)     Initial Impression / Assessment and Plan / ED Course  I have reviewed the triage vital signs and the nursing notes.  Pertinent labs & imaging results that were available during my care of the patient were reviewed by me and considered in my medical decision making (see chart for details).       Patient will be started on heparin.  Patient's last renal function was done on July 31.  A CT scan of the abdomen pelvis today does show compression on the ureter.  Plus patient just had a dye load.  Patient nontoxic in no respiratory distress not tachycardic.  But does have significant bilateral pulmonary embolus load.  Discussed with the hospitalist who will admit to stepdown.  As stated started on heparin.  Call placed to Dr. Simeon Craft such the patient's hematologist oncologist.  They have not called back.  COVID testing is been ordered and is pending.  Patient may require follow-up CT angios we did not want to do that today since she Artie had a dye load.  And at the time we did not have renal function back yet.  Final Clinical Impressions(s) / ED Diagnoses   Final diagnoses:  Saddle embolus of pulmonary artery without acute cor pulmonale, unspecified chronicity [facility])    ED Discharge Orders    None       Fredia Sorrow, MD 02/02/19 1238    Fredia Sorrow, MD 02/02/19 1308  Addendum:  Discussed with Dr. Bradley Ferris from oncology.  He will let Dr. Simeon Craft CG know.    Fredia Sorrow, MD 02/02/19 1323

## 2019-02-02 NOTE — H&P (Addendum)
History and Physical  Paula Peterson KGY:[id number] DOB: October 16, 1940 DOA: 02/02/2019  Referring physician: EDP PCP: Caryl Bis, MD   Chief Complaint: DOE, PE  HPI: Paula Peterson is a 78 y.o. female   H/o HTN, hypothyroidism, stage IV ovarian cancer with chest and peritoneum metastasis is sent from radiology department to the ED due to acute bilateral PE diagnosed  during routine cancer staging CT. She reports progressive DOE, but denies sob at rest, no chest pain, no cough, no lower extremity edema, no syncope.  Review of Systems:  Detail per HPI, Review of systems are otherwise negative  Past Medical History:  Diagnosis Date  . Back pain   . Family history of stomach cancer   . Family history of stomach cancer   . GERD (gastroesophageal reflux disease)   . Hypertension   . Pessary maintenance   . Stroke [facility])    Past Surgical History:  Procedure Laterality Date  . ABDOMINAL HYSTERECTOMY    . BACK SURGERY    . FRACTURE SURGERY    . IR IMAGING GUIDED PORT INSERTION  12/15/2018   Social History:  reports that she has never smoked. She has never used smokeless tobacco. She reports that she does not drink alcohol or use drugs. Patient lives at home & is able to participate in activities of daily living independently   No Known Allergies  Family History  Problem Relation Age of Onset  . Stomach cancer Sister        dx 71s-50s  . Dementia Mother   . Cancer Sister        unk type, hysterectomy, dx 90s      Prior to Admission medications   Medication Sig Start Date End Date Taking? Authorizing Provider  levothyroxine (SYNTHROID) 25 MCG tablet Take 1 tablet (25 mcg total) by mouth daily before breakfast. 01/27/19  Yes Gorsuch, Ni, MD  losartan (COZAAR) 50 MG tablet Take 50 mg by mouth daily.   Yes [provider]  aspirin EC 81 MG tablet Take 81 mg by mouth daily.    [provider]  cetirizine (ZYRTEC) 10 MG tablet Take 10 mg by mouth daily.    [provider]  Coenzyme Q10 (CO Q 10 PO) Take 200 mg by mouth daily.    [provider]  dexamethasone (DECADRON) 4 MG tablet Take 2 tabs at the night before and 2 tab the morning of chemotherapy, every 3 weeks, by mouth with food 12/07/18   [name], MD  esomeprazole (NEXIUM) 40 MG capsule Take 40 mg by mouth daily at 12 noon.    [provider]  estradiol (ESTRACE) 1 MG tablet Take 1 mg by mouth daily.    [provider]  ESTRADIOL ACETATE PO Take 20 mg by mouth daily.    [provider]  ibuprofen (ADVIL) 200 MG tablet Take 200-400 mg by mouth every 6 (six) hours as needed.    [provider]  lidocaine-prilocaine (EMLA) cream Apply to affected area once 12/07/18   [name], MD  omeprazole (PRILOSEC) 20 MG capsule Take 20 mg by mouth daily.    [provider]  ondansetron (ZOFRAN) 8 MG tablet Take 1 tablet (8 mg total) by mouth every 8 (eight) hours as needed for refractory nausea / vomiting. 12/07/18   [name], MD  polyethylene glycol (MIRALAX / GLYCOLAX) 17 g packet Take 17 g by mouth 2 (two) times daily as needed.    [provider]  prochlorperazine (COMPAZINE) 10 MG tablet Take 1 tablet (10 mg total) by mouth every 6 (six) hours as needed (Nausea or vomiting). 12/07/18   [name], MD  traMADol (ULTRAM) 50 MG tablet Take 1 tablet (50 mg total) by mouth every 6 (six) hours as needed. 01/27/19   [name], MD  vitamin B-12 (CYANOCOBALAMIN) 500 MCG tablet Take 500 mcg by mouth daily.    [provider]    Physical Exam: BP (!) 141/84   Pulse 85   Temp 97.7 F (36.5 C) (Oral)   Resp 17   Ht 5\' 7"  (1.702 m)   Wt 96.2 kg   SpO2 97%   BMI 33.20 kg/m   . General:  NAD . Eyes: PERRL . ENT: unremarkable . Neck: supple, no JVD . Cardiovascular: RRR . Respiratory: CTABL . Abdomen: soft/NT/ND, positive bowel sounds . Skin: no rash . Musculoskeletal:  No edema . Psychiatric:  calm/cooperative . Neurologic: no focal findings            Labs on Admission:  Basic Metabolic Panel: Recent Labs  Lab 01/27/19 0749 02/02/19 1140  NA 139 135  K 4.4 3.8  CL 105 99  CO2 24 26  GLUCOSE 147* 103*  BUN 24* 18  CREATININE 0.99 0.71  CALCIUM 10.2 9.7   Liver Function Tests: Recent Labs  Lab 01/27/19 0749  AST 21  ALT 32  ALKPHOS 121  BILITOT 0.2*  PROT 7.8  ALBUMIN 3.5   No results for input(s): LIPASE, AMYLASE in the last 168 hours. No results for input(s): AMMONIA in the last 168 hours. CBC: Recent Labs  Lab 01/27/19 0749 02/02/19 1140  WBC 5.4 3.9*  NEUTROABS 4.3 2.0  HGB 12.5 11.4*  HCT 37.5 34.5*  MCV 91.0 93.0  PLT 257 135*   Cardiac Enzymes: No results for input(s): CKTOTAL, CKMB, CKMBINDEX, TROPONINI in the last 168 hours.  BNP (last 3 results) No results for input(s): BNP in the last 8760 hours.  ProBNP (last 3 results) No results for input(s): PROBNP in the last 8760 hours.  CBG: No results for input(s): GLUCAP in the last 168 hours.  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 Follow-up evaluation. EXAM: CT CHEST, ABDOMEN, AND PELVIS WITH CONTRAST TECHNIQUE: Multidetector CT imaging of the chest, abdomen and pelvis was performed following the standard protocol during bolus administration of intravenous contrast. CONTRAST:  178mL OMNIPAQUE IOHEXOL 300 MG/ML  SOLN COMPARISON:  Chest CT 12/12/2018. FINDINGS: CT CHEST FINDINGS Cardiovascular: Heart size is normal. There is no significant pericardial fluid, thickening or pericardial calcification. There is aortic atherosclerosis, as well as atherosclerosis of the great vessels of the mediastinum and the coronary arteries, including calcified atherosclerotic plaque in the left anterior descending, left  circumflex and right coronary arteries. Filling defects are noted in the pulmonary arterial tree bilaterally, indicative of widespread pulmonary embolus. This includes saddle embolus, main, lobar, segmental and subsegmental sized emboli bilaterally. The largest burden of clot appears in the right lower lobe pulmonary arterial tree where there is a combination of occlusive and nonocclusive thrombus. Right internal jugular single-lumen porta cath with tip terminating in the superior aspect of the right atrium. Right ventricle measures approximately 4.2 cm in diameter. Left ventricle measures approximately 4.4 cm in diameter. RV to LV ratio of 0.95. Mediastinum/Nodes: No pathologically enlarged mediastinal or hilar lymph nodes. Esophagus  is unremarkable in appearance. No axillary lymphadenopathy. Lungs/Pleura: Multiple small predominantly pleural-based nodules are again noted throughout the lungs bilaterally, similar in size and number to the prior examination, nonspecific, but favored to be benign. No other larger more suspicious appearing pulmonary nodules or masses are noted. No acute consolidative airspace disease. Resolution of previously noted left pleural effusion. Small right pleural effusion has decreased in size compared to the prior study. Musculoskeletal: There are no aggressive appearing lytic or blastic lesions noted in the visualized portions of the skeleton. CT ABDOMEN PELVIS FINDINGS Hepatobiliary: No suspicious cystic or solid hepatic lesions. No intra or extrahepatic biliary ductal dilatation. Gallbladder is normal in appearance. Pancreas: No pancreatic mass. No pancreatic ductal dilatation. No pancreatic or peripancreatic fluid collections or inflammatory changes are noted. Spleen: Unremarkable. Adrenals/Urinary Tract: Multiple low-attenuation lesions in the left renal hilum, most compatible with small parapelvic cysts. No suspicious right renal lesions. Mild right hydroureteronephrosis, new  compared to the prior study, which appears related to external compression on the distal third of the right ureter by the patient's large pelvic mass (discussed below). No left hydroureteronephrosis. Urinary bladder is unremarkable in appearance. Bilateral adrenal glands are normal in appearance. Stomach/Bowel: Normal appearance of the stomach. No pathologic dilatation of small bowel or colon. Appendix is not confidently identified may be surgically absent or atrophic. Vascular/Lymphatic: Aortic atherosclerosis, without evidence of aneurysm or dissection in the abdominal or pelvic vasculature. Filling defect in the right superficial femoral vein incompletely imaged (axial image 137 of series 2), compatible with deep venous thrombosis. No lymphadenopathy noted in the abdomen or pelvis. Reproductive: Status post hysterectomy. Pessary in the vagina. Large cystic mass in the central aspect of the pelvis which has increased in size compared to the prior examination, largest of which measures 15.7 x 11.0 x 12.4 cm (axial image 108 of series 2 and coronal image 70 of series 5), previously 13.4 x 9.8 cm on 11/29/2018. Other: There continues to be scattered areas of abnormal soft tissue throughout the peritoneal cavity, indicative of widespread intraperitoneal metastatic disease, bulkiest portion of which is visualized on axial image 94 of series 2, overall decreased in size compared to the prior study. Trace ascites in the low anatomic pelvis. No pneumoperitoneum. Musculoskeletal: There are no aggressive appearing lytic or blastic lesions noted in the visualized portions of the skeleton. IMPRESSION: 1. Massive pulmonary embolism, as discussed above. Given the mildly elevated RV to LV ratio of 0.95, this is associated with increased risk of morbidity and mortality. 2. Today's study demonstrates a mixed response to therapy. Specifically, while there has been regression of the bulky intraperitoneal metastatic disease and  regression of previously noted pleural effusions, the large cystic mass in the central pelvis has increased in size compared to the prior study. 3. New right mild hydroureteronephrosis related to extrinsic compression on the distal third of the right ureter by the patient's large pelvic mass. 4. Scattered small pulmonary nodules (predominantly pleural based) appear stable compared to prior examinations. These are nonspecific but warrant continued attention on follow-up studies. 5. Aortic atherosclerosis, in addition to three-vessel coronary artery disease. Assessment for potential risk factor modification, dietary therapy or pharmacologic therapy may be warranted, if clinically indicated. 6. Additional incidental findings, as above. Critical Value/emergent results were called by telephone at the time of interpretation on 02/02/2019 at 10:36 a.m. to Dr. [name] , who verbally acknowledged these results. Electronically Signed   By: Vinnie Langton M.D.   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 Follow-up evaluation. EXAM: CT CHEST, ABDOMEN, AND PELVIS WITH CONTRAST TECHNIQUE: Multidetector CT imaging of the chest, abdomen and pelvis was performed following the standard protocol during bolus administration of intravenous contrast. CONTRAST:  119mL OMNIPAQUE IOHEXOL 300 MG/ML  SOLN COMPARISON:  Chest CT 12/12/2018. FINDINGS: CT CHEST FINDINGS Cardiovascular: Heart size is normal. There is no significant pericardial fluid, thickening or pericardial calcification. There is aortic atherosclerosis, as well as atherosclerosis of the great vessels of the mediastinum and the coronary arteries, including calcified atherosclerotic plaque in the left anterior descending, left circumflex and right coronary arteries. Filling defects are  noted in the pulmonary arterial tree bilaterally, indicative of widespread pulmonary embolus. This includes saddle embolus, main, lobar, segmental and subsegmental sized emboli bilaterally. The largest burden of clot appears in the right lower lobe pulmonary arterial tree where there is a combination of occlusive and nonocclusive thrombus. Right internal jugular single-lumen porta cath with tip terminating in the superior aspect of the right atrium. Right ventricle measures approximately 4.2 cm in diameter. Left ventricle measures approximately 4.4 cm in diameter. RV to LV ratio of 0.95. Mediastinum/Nodes: No pathologically enlarged mediastinal or hilar lymph nodes. Esophagus is unremarkable in appearance. No axillary lymphadenopathy. Lungs/Pleura: Multiple small predominantly pleural-based nodules are again noted throughout the lungs bilaterally, similar in size and number to the prior examination, nonspecific, but favored to be benign. No other larger more suspicious appearing pulmonary nodules or masses are noted. No acute consolidative airspace disease. Resolution of previously noted left pleural effusion. Small right pleural effusion has decreased in size compared to the prior study. Musculoskeletal: There are no aggressive appearing lytic or blastic lesions noted in the visualized portions of the skeleton. CT ABDOMEN PELVIS FINDINGS Hepatobiliary: No suspicious cystic or solid hepatic lesions. No intra or extrahepatic biliary ductal dilatation. Gallbladder is normal in appearance. Pancreas: No pancreatic mass. No pancreatic ductal dilatation. No pancreatic or peripancreatic fluid collections or inflammatory changes are noted. Spleen: Unremarkable. Adrenals/Urinary Tract: Multiple low-attenuation lesions in the left renal hilum, most compatible with small parapelvic cysts. No suspicious right renal lesions. Mild right hydroureteronephrosis, new compared to the prior study, which appears related to external  compression on the distal third of the right ureter by the patient's large pelvic mass (discussed below). No left hydroureteronephrosis. Urinary bladder is unremarkable in appearance. Bilateral adrenal glands are normal in appearance. Stomach/Bowel: Normal appearance of the stomach. No pathologic dilatation of small bowel or colon. Appendix is not confidently identified may be surgically absent or atrophic. Vascular/Lymphatic: Aortic atherosclerosis, without evidence of aneurysm or dissection in the abdominal or pelvic vasculature. Filling defect in the right superficial femoral vein incompletely imaged (axial image 137 of series 2), compatible with deep venous thrombosis. No lymphadenopathy noted in the abdomen or pelvis. Reproductive: Status post hysterectomy. Pessary in the vagina. Large cystic mass in the central aspect of the pelvis which has increased in size compared to the prior examination, largest of which measures 15.7 x 11.0 x 12.4 cm (axial image 108 of series 2 and coronal image 70 of series 5), previously 13.4 x 9.8 cm on 11/29/2018. Other: There continues to be scattered areas of abnormal soft tissue throughout the peritoneal cavity, indicative of widespread intraperitoneal metastatic disease, bulkiest portion of which is visualized on axial image 94 of series 2, overall decreased in size compared to the prior  study. Trace ascites in the low anatomic pelvis. No pneumoperitoneum. Musculoskeletal: There are no aggressive appearing lytic or blastic lesions noted in the visualized portions of the skeleton. IMPRESSION: 1. Massive pulmonary embolism, as discussed above. Given the mildly elevated RV to LV ratio of 0.95, this is associated with increased risk of morbidity and mortality. 2. Today's study demonstrates a mixed response to therapy. Specifically, while there has been regression of the bulky intraperitoneal metastatic disease and regression of previously noted pleural effusions, the large cystic  mass in the central pelvis has increased in size compared to the prior study. 3. New right mild hydroureteronephrosis related to extrinsic compression on the distal third of the right ureter by the patient's large pelvic mass. 4. Scattered small pulmonary nodules (predominantly pleural based) appear stable compared to prior examinations. These are nonspecific but warrant continued attention on follow-up studies. 5. Aortic atherosclerosis, in addition to three-vessel coronary artery disease. Assessment for potential risk factor modification, dietary therapy or pharmacologic therapy may be warranted, if clinically indicated. 6. Additional incidental findings, as above. Critical Value/emergent results were called by telephone at the time of interpretation on 02/02/2019 at 10:36 a.m. to Dr. [name] , who verbally acknowledged these results. Electronically Signed   By: Vinnie Langton M.D.   On: 02/02/2019 11:33    EKG: Independently reviewed. *  Assessment/Plan Present on Admission: **None**  Acute bilateral PE with signs of right heart strain on CT scan but patient 's vital signs are stable, she does not appear have symptom at rest -continue heparin drip, get echocardiogram, venous doppler,  Admit to stepdowm   New right mild hydroureteronephrosis related to extrinsic compression on the distal third of the right ureter by the patient's large pelvic mass. She denies flank pain, denies urinary difficulty, cr wnl.   HTN: bp stable, will home home bp meds in the setting of acute PE.  Hypothyroidism: continue synthroid  Constipation: stool softener  SARS COV2 screening negative.  DVT prophylaxis: on heparin drip  Consultants:  Oncology Dr Sherren Kerns will see patient in consult tomorrow  Code Status: full   Family Communication:  Patient  ,daughter over the phone  Disposition Plan: admit to stepdown  Time spent: 82mins  Florencia Reasons MD, PhD, [facility] Pager [phone] If  7PM-7AM, please contact night-coverage at [email or web address], password [facility]

## 2019-02-03 ENCOUNTER — Inpatient Hospital Stay (HOSPITAL_COMMUNITY): Payer: Medicare Other

## 2019-02-03 DIAGNOSIS — N1339 Other hydronephrosis: Secondary | ICD-10-CM

## 2019-02-03 DIAGNOSIS — D696 Thrombocytopenia, unspecified: Secondary | ICD-10-CM

## 2019-02-03 DIAGNOSIS — I10 Essential (primary) hypertension: Secondary | ICD-10-CM

## 2019-02-03 DIAGNOSIS — I2699 Other pulmonary embolism without acute cor pulmonale: Secondary | ICD-10-CM

## 2019-02-03 DIAGNOSIS — N135 Crossing vessel and stricture of ureter without hydronephrosis: Secondary | ICD-10-CM

## 2019-02-03 DIAGNOSIS — I82401 Acute embolism and thrombosis of unspecified deep veins of right lower extremity: Secondary | ICD-10-CM

## 2019-02-03 DIAGNOSIS — E039 Hypothyroidism, unspecified: Secondary | ICD-10-CM

## 2019-02-03 DIAGNOSIS — D61810 Antineoplastic chemotherapy induced pancytopenia: Secondary | ICD-10-CM

## 2019-02-03 DIAGNOSIS — D638 Anemia in other chronic diseases classified elsewhere: Secondary | ICD-10-CM

## 2019-02-03 LAB — BASIC METABOLIC PANEL
Anion gap: 9 (ref 5–15)
BUN: 18 mg/dL (ref 8–23)
CO2: 25 mmol/L (ref 22–32)
Calcium: 8.9 mg/dL (ref 8.9–10.3)
Chloride: 102 mmol/L (ref 98–111)
Creatinine, Ser: 0.93 mg/dL (ref 0.44–1.00)
GFR calc Af Amer: >60 mL/min (ref >60)
GFR calc non Af Amer: >60 mL/min (ref >60)
Glucose, Bld: 107 mg/dL — ABNORMAL HIGH (ref 70–99)
Potassium: 3.6 mmol/L (ref 3.5–5.1)
Sodium: 136 mmol/L (ref 135–145)

## 2019-02-03 LAB — CBC
HCT: 29.5 % — ABNORMAL LOW (ref 36.0–46.0)
Hemoglobin: 9.7 g/dL — ABNORMAL LOW (ref 12.0–15.0)
MCH: 30.7 pg (ref 26.0–34.0)
MCHC: 32.9 g/dL (ref 30.0–36.0)
MCV: 93.4 fL (ref 80.0–100.0)
Platelets: 129 10*3/uL — ABNORMAL LOW (ref 150–400)
RBC: 3.16 MIL/uL — ABNORMAL LOW (ref 3.87–5.11)
RDW: 14 % (ref 11.5–15.5)
WBC: 3 10*3/uL — ABNORMAL LOW (ref 4.0–10.5)
nRBC: 0 % (ref 0.0–0.2)

## 2019-02-03 LAB — HEPARIN LEVEL (UNFRACTIONATED)
Heparin Unfractionated: 0.39 IU/mL (ref 0.30–0.70)
Heparin Unfractionated: 0.46 IU/mL (ref 0.30–0.70)

## 2019-02-03 MED ORDER — HEPARIN (PORCINE) 25000 UT/250ML-% IV SOLN
1300.0000 [IU]/h | INTRAVENOUS | Status: DC
  Administered 2019-02-03 (×2): 1300 [IU]/h via INTRAVENOUS
  Filled 2019-02-03 (×2): qty 250

## 2019-02-03 MED ORDER — HEPARIN (PORCINE) 25000 UT/250ML-% IV SOLN
1300.0000 [IU]/h | INTRAVENOUS | Status: DC
  Administered 2019-02-03: 1300 [IU]/h via INTRAVENOUS

## 2019-02-03 MED ORDER — CHLORHEXIDINE GLUCONATE CLOTH 2 % EX PADS: 6.0000 | MEDICATED_PAD | Freq: Every day | CUTANEOUS | Status: DC

## 2019-02-03 MED ORDER — CHLORHEXIDINE GLUCONATE CLOTH 2 % EX PADS
6.0000 | MEDICATED_PAD | Freq: Every day | CUTANEOUS | Status: DC
  Administered 2019-02-03 – 2019-02-04 (×2): 6 via TOPICAL

## 2019-02-03 MED ORDER — ACETAMINOPHEN 325 MG PO TABS
650.0000 mg | ORAL_TABLET | Freq: Four times a day (QID) | ORAL | Status: DC | PRN
  Administered 2019-02-03 – 2019-02-04 (×2): 650 mg via ORAL
  Filled 2019-02-03 (×2): qty 2

## 2019-02-03 MED ORDER — ORAL CARE MOUTH RINSE
15.0000 mL | Freq: Two times a day (BID) | OROMUCOSAL | Status: DC
  Administered 2019-02-03 – 2019-02-04 (×3): 15 mL via OROMUCOSAL

## 2019-02-03 MED ORDER — ENOXAPARIN SODIUM 60 MG/0.6ML ~~LOC~~ SOLN
60.0000 mg | Freq: Two times a day (BID) | SUBCUTANEOUS | Status: DC
  Filled 2019-02-03: qty 0.6

## 2019-02-03 MED ORDER — PANTOPRAZOLE SODIUM 40 MG PO TBEC
40.0000 mg | DELAYED_RELEASE_TABLET | Freq: Every day | ORAL | Status: DC
  Administered 2019-02-03 – 2019-02-04 (×2): 40 mg via ORAL
  Filled 2019-02-03 (×2): qty 1

## 2019-02-03 NOTE — Progress Notes (Signed)
PROGRESS NOTE  Paula Peterson ZOX:[id number] DOB: 02-24-1941   PCP: Caryl Bis, MD  Patient is from: Home  DOA: 02/02/2019 LOS: 1  Brief Narrative / Interim history: 78 year old female with history of HTN, hypothyroidism, stage IV ovarian cancer with metastasis to chest and peritoneum who was found to have acute bilateral PE during the routine cancer staging CT. Had some progressive DOE. Admitted for PE with some heart strain on CTA chest.  Lower extremity venous Doppler revealed DVT in right lower extremity. Remained hemodynamically stable.   Subjective: No major issues overnight of this morning.  SBP dropped from 149-94 but back up to 155 late in the morning. Reports some dyspnea on exertion but no chest pain, shortness of breath, calf swelling or pain.   Patient became tearful when after we discussed about the possibility of indefinite Lovenox injection for treatment.   Objective: Vitals:   02/03/19 0700 02/03/19 0800 02/03/19 0900 02/03/19 1000  BP: (!) 149/69 (!) 94/52  (!) 155/72  Pulse: 77 77 96 87  Resp: 15 (!) 23 20 20   Temp:  98 F (36.7 C)    TempSrc:  Oral    SpO2: 96% 97% 98% 98%  Weight:      Height:        Intake/Output Summary (Last 24 hours) at 02/03/2019 1147 Last data filed at 02/03/2019 0942 Gross per 24 hour  Intake 1497.45 ml  Output 1650 ml  Net -152.55 ml   Filed Weights   02/02/19 1117  Weight: 96.2 kg    Examination:  GENERAL: No acute distress.  Appears well.  HEENT: MMM.  Vision and hearing grossly intact.  NECK: Supple.  No apparent JVD but sitting upright. RESP:  No IWOB.  Fair air movement bilaterally.  No apparent JVD but sitting upright. CVS:  RRR. Heart sounds normal.  ABD/GI/GU: Bowel sounds present. Soft. Non tender.  MSK/EXT:  Moves extremities. No apparent deformity or edema.  No LE swelling or calf tenderness. SKIN: no apparent skin lesion or wound NEURO: Awake, alert and oriented appropriately.  No gross deficit.  PSYCH:  Calm. Normal affect.   Port a cath in right chest.  I have personally reviewed the following labs and images:  Radiology Studies: Vas [place] Lower Extremity Venous (dvt)  Result Date: 02/03/2019  Lower Venous Study Indications: Pulmonary embolism.  Risk Factors: Cancer. Anticoagulation: Heparin. Comparison Study: No prior studies. Performing Technologist: Oliver Hum RVT  Examination Guidelines: A complete evaluation includes B-mode imaging, spectral Doppler, color Doppler, and power Doppler as needed of all accessible portions of each vessel. Bilateral testing is considered an integral part of a complete examination. Limited examinations for reoccurring indications may be performed as noted.  +---------+---------------+---------+-----------+----------+-------+ RIGHT    CompressibilityPhasicitySpontaneityPropertiesSummary +---------+---------------+---------+-----------+----------+-------+ CFV      Full           No       No                           +---------+---------------+---------+-----------+----------+-------+ SFJ      Full                                                 +---------+---------------+---------+-----------+----------+-------+ FV Prox  Partial        No       No  Acute   +---------+---------------+---------+-----------+----------+-------+ FV Mid   None           No       No                   Acute   +---------+---------------+---------+-----------+----------+-------+ FV DistalPartial        No       No                   Acute   +---------+---------------+---------+-----------+----------+-------+ POP      Partial        No       No                   Acute   +---------+---------------+---------+-----------+----------+-------+ PTV      Full                                                 +---------+---------------+---------+-----------+----------+-------+ PERO     Partial                                      Acute    +---------+---------------+---------+-----------+----------+-------+ Soleal   Partial                                      Acute   +---------+---------------+---------+-----------+----------+-------+ Gastroc  Partial                                      Acute   +---------+---------------+---------+-----------+----------+-------+   +---------+---------------+---------+-----------+----------+-------+ LEFT     CompressibilityPhasicitySpontaneityPropertiesSummary +---------+---------------+---------+-----------+----------+-------+ CFV      Full           Yes      Yes                          +---------+---------------+---------+-----------+----------+-------+ SFJ      Full                                                 +---------+---------------+---------+-----------+----------+-------+ FV Prox  Full                                                 +---------+---------------+---------+-----------+----------+-------+ FV Mid   Full                                                 +---------+---------------+---------+-----------+----------+-------+ FV DistalFull                                                 +---------+---------------+---------+-----------+----------+-------+ PFV  Full                                                 +---------+---------------+---------+-----------+----------+-------+ POP      Full           Yes      Yes                          +---------+---------------+---------+-----------+----------+-------+ PTV      Full                                                 +---------+---------------+---------+-----------+----------+-------+ PERO     Full                                                 +---------+---------------+---------+-----------+----------+-------+     Summary: Right: Findings consistent with acute deep vein thrombosis involving the right femoral vein, right popliteal vein, right peroneal veins,  right soleal veins, and right gastrocnemius veins. No cystic structure found in the popliteal fossa. Left: There is no evidence of deep vein thrombosis in the lower extremity. No cystic structure found in the popliteal fossa.  *See table(s) above for measurements and observations.    Preliminary     Microbiology: Recent Results (from the past 240 hour(s))  SARS Coronavirus 2 [facility] order, Performed in [facility] hospital lab) Nasopharyngeal Nasopharyngeal Swab     Status: None   Collection Time: 02/02/19 12:27 PM   Specimen: Nasopharyngeal Swab  Result Value Ref Range Status   SARS Coronavirus 2 NEGATIVE NEGATIVE Final    Comment: (NOTE) If result is NEGATIVE SARS-CoV-2 target nucleic acids are NOT DETECTED. The SARS-CoV-2 RNA is generally detectable in upper and lower  respiratory specimens during the acute phase of infection. The lowest  concentration of SARS-CoV-2 viral copies this assay can detect is 250  copies / mL. A negative result does not preclude SARS-CoV-2 infection  and should not be used as the sole basis for treatment or other  patient management decisions.  A negative result may occur with  improper specimen collection / handling, submission of specimen other  than nasopharyngeal swab, presence of viral mutation(s) within the  areas targeted by this assay, and inadequate number of viral copies  (<250 copies / mL). A negative result must be combined with clinical  observations, patient history, and epidemiological information. If result is POSITIVE SARS-CoV-2 target nucleic acids are DETECTED. The SARS-CoV-2 RNA is generally detectable in upper and lower  respiratory specimens dur ing the acute phase of infection.  Positive  results are indicative of active infection with SARS-CoV-2.  Clinical  correlation with patient history and other diagnostic information is  necessary to determine patient infection status.  Positive results do  not rule out bacterial infection  or co-infection with other viruses. If result is PRESUMPTIVE POSTIVE SARS-CoV-2 nucleic acids MAY BE PRESENT.   A presumptive positive result was obtained on the submitted specimen  and confirmed on repeat testing.  While 2019 novel coronavirus  (SARS-CoV-2) nucleic acids may be present in the  submitted sample  additional confirmatory testing may be necessary for epidemiological  and / or clinical management purposes  to differentiate between  SARS-CoV-2 and other Sarbecovirus currently known to infect humans.  If clinically indicated additional testing with an alternate test  methodology [phone]) is advised. The SARS-CoV-2 RNA is generally  detectable in upper and lower respiratory sp ecimens during the acute  phase of infection. The expected result is Negative. Fact Sheet for Patients:  StrictlyIdeas.no Fact Sheet for Healthcare Providers: BankingDealers.co.za This test is not yet approved or cleared by the [place] FDA and has been authorized for detection and/or diagnosis of SARS-CoV-2 by FDA under an Emergency Use Authorization (EUA).  This EUA will remain in effect (meaning this test can be used) for the duration of the COVID-19 declaration under Section 564(b)(1) of the Act, 21 U.S.C. section 360bbb-3(b)(1), unless the authorization is terminated or revoked sooner. Performed at [facility], [place] [address], [place], [place] [address]   MRSA PCR Screening     Status: None   Collection Time: 02/02/19  4:46 PM   Specimen: Nasal Mucosa; Nasopharyngeal  Result Value Ref Range Status   MRSA by PCR NEGATIVE NEGATIVE Final    Comment:        The GeneXpert MRSA Assay (FDA approved for NASAL specimens only), is one component of a comprehensive MRSA colonization surveillance program. It is not intended to diagnose MRSA infection nor to guide or monitor treatment for MRSA infections. Performed at [facility], [place] [address], [place], [place] [address]     Sepsis Labs: Invalid input(s): PROCALCITONIN, LACTICIDVEN  Urine analysis:    Component Value Date/Time   COLORURINE YELLOW 04/08/2013 2040   APPEARANCEUR CLEAR 04/08/2013 2040   LABSPEC 1.007 04/08/2013 2040   PHURINE 6.5 04/08/2013 2040   GLUCOSEU NEGATIVE 04/08/2013 2040   HGBUR NEGATIVE 04/08/2013 2040   BILIRUBINUR NEGATIVE 04/08/2013 2040   [place] NEGATIVE 04/08/2013 2040   PROTEINUR NEGATIVE 04/08/2013 2040   UROBILINOGEN 0.2 04/08/2013 2040   NITRITE NEGATIVE 04/08/2013 2040   LEUKOCYTESUR NEGATIVE 04/08/2013 2040    Anemia Panel: No results for input(s): VITAMINB12, FOLATE, FERRITIN, TIBC, IRON, RETICCTPCT in the last 72 hours.  Thyroid Function Tests: No results for input(s): TSH, T4TOTAL, FREET4, T3FREE, THYROIDAB in the last 72 hours.  Lipid Profile: No results for input(s): CHOL, HDL, LDLCALC, TRIG, CHOLHDL, LDLDIRECT in the last 72 hours.  CBG: No results for input(s): GLUCAP in the last 168 hours.  HbA1C: No results for input(s): HGBA1C in the last 72 hours.  BNP (last 3 results): No results for input(s): PROBNP in the last 8760 hours.  Cardiac Enzymes: No results for input(s): CKTOTAL, CKMB, CKMBINDEX, TROPONINI in the last 168 hours.  Coagulation Profile: No results for input(s): INR, PROTIME in the last 168 hours.  Liver Function Tests: No results for input(s): AST, ALT, ALKPHOS, BILITOT, PROT, ALBUMIN in the last 168 hours. No results for input(s): LIPASE, AMYLASE in the last 168 hours. No results for input(s): AMMONIA in the last 168 hours.  Basic Metabolic Panel: Recent Labs  Lab 02/02/19 1140 02/03/19 0106  NA 135 136  K 3.8 3.6  CL 99 102  CO2 26 25  GLUCOSE 103* 107*  BUN 18 18  CREATININE 0.71 0.93  CALCIUM 9.7 8.9   GFR: Estimated Creatinine Clearance: 62.2 mL/min (by C-G formula based on SCr of 0.93 mg/dL).  CBC: Recent Labs  Lab 02/02/19 1140  02/03/19 0106  WBC 3.9* 3.0*  NEUTROABS 2.0  --   HGB 11.4* 9.7*  HCT 34.5* 29.5*  MCV 93.0 93.4  PLT 135* 129*    Procedures:  None  Microbiology summarized: MRSA PCR negative. COVID-19 negative.  Assessment & Plan: Bilateral pulmonary embolism/RLE DVT: Reported as "massive" but RV to LV ratio of 0.95.  No right heart strain on echocardiogram.  Patient remains hemodynamically stable except for brief drop in her systolic BP to 94 this morning which is back up to 150's now.  No oxygen requirement. -Continue heparin drip per oncology -Likely need Lovenox injection indefinitely which is gold standard given history of cancer. -Oncology, Dr. Alvy Bimler to see patient  Stage IV ovarian cancer with peritoneum and chest metastasis -Per oncology.  Neuro right hydro-ureteronephrosis related to extrinsic compression on the distal third of right ureter by pelvic mass.  Patient has no urinary symptoms or flank pain. -Per oncology for pelvic mass.  Anemia of chronic disease: Slight drop in Hgb from 11.4-9.7 likely dilutional. -Continue monitoring -Discontinue IV fluid  Thrombocytopenia: Stable. -Continue monitoring  Hypertension: Fairly controlled. -We will hold off antihypertensive meds for now -Discontinue IV fluid  Hypothyroidism -Continue home Synthroid  Constipation -Bowel regimen to ensure regular bowel movement daily given history of ovarian cancer.  SARS-CoV-2 screening negative.  DVT prophylaxis: On heparin drip for PE Code Status: Full code Family Communication: Discussed with patient's daughter over the phone. Disposition Plan: Remains inpatient.  Transfer to telemetry bed. Consultants: Oncology   Antimicrobials: Anti-infectives (From admission, onward)   None      Sch Meds:  Scheduled Meds: . Chlorhexidine Gluconate Cloth  6 each Topical Daily  . levothyroxine  25 mcg Oral QAC breakfast  . mouth rinse  15 mL Mouth Rinse BID  . senna-docusate  1 tablet Oral  BID   Continuous Infusions: . sodium chloride 75 mL/hr at 02/02/19 1222  . heparin 1,300 Units/hr (02/03/19 0941)   PRN Meds:.acetaminophen, lidocaine-prilocaine, polyethylene glycol, traMADol  35 minutes with more than 50% spent in reviewing records, counseling patient/family and coordinating care.  Taye T. Cranston  If 7PM-7AM, please contact night-coverage [email or web address] Password [facility] 02/03/2019, 11:47 AM

## 2019-02-03 NOTE — Progress Notes (Signed)
Per ultrasound, patient positive for DVT in Right leg. Dr. Cyndia Skeeters made aware

## 2019-02-03 NOTE — Progress Notes (Addendum)
ANTICOAGULATION CONSULT NOTE   Pharmacy Consult for Heparin Indication: pulmonary embolus  No Known Allergies  Patient Measurements: Height: 5\' 7"  (170.2 cm) Weight: 212 lb (96.2 kg) IBW/kg (Calculated) : 61.6 Heparin Dosing Weight: 82.7kg (TBW 96.2)  Vital Signs: Temp: 98.7 F (37.1 C) (08/07 0008) Temp Source: Oral (08/07 0008) BP: 121/52 (08/06 2200) Pulse Rate: 86 (08/06 2200)  Labs: Recent Labs    02/02/19 1140 02/02/19 2030 02/03/19 0105 02/03/19 0106  HGB 11.4*  --   --  9.7*  HCT 34.5*  --   --  29.5*  PLT 135*  --   --  129*  HEPARINUNFRC  --  0.35 0.39  --   CREATININE 0.71  --   --  0.93   Estimated Creatinine Clearance: 62.2 mL/min (by C-G formula based on SCr of 0.93 mg/dL).  Medical History: Past Medical History:  Diagnosis Date  . Back pain   . Family history of stomach cancer   . Family history of stomach cancer   . GERD (gastroesophageal reflux disease)   . Hypertension   . Pessary maintenance   . Stroke [facility])    Medications:  Scheduled:  . levothyroxine  25 mcg Oral QAC breakfast  . senna-docusate  1 tablet Oral BID   Infusions:  . sodium chloride 75 mL/hr at 02/02/19 1222  . heparin 1,200 Units/hr (02/02/19 2200)    Assessment: 78 yoF with abd CT today for Ca follow-up, [place] PE noted, admit thru ED, begin Heparin  Today, 8/7 0129 HL = 0.39 at goal, no infusion or bleeding issues reported by RN.  H/H =9.7/29./5   Plts = 129   All trending down  Goal of Therapy:  Heparin level 0.3-0.7 units/ml Monitor platelets by anticoagulation protocol: Yes   Plan:    Will increase heparin drip slightly to 1300 units/hr to ensure stays in range  Recheck HL 8 hours  Daily CBC while on Heparin, daily Hep level when at steady state   Dorrene German 02/03/2019, 2:13 AM

## 2019-02-03 NOTE — Progress Notes (Signed)
Bilateral lower extremity venous duplex has been completed. Preliminary results can be found in CV Proc through chart review.  Results were given to the patient's nurse, Benjamine Mola.  02/03/19 10:42 AM Paula Peterson RVT

## 2019-02-03 NOTE — Progress Notes (Signed)
Paula Peterson   DOB:[birth year]   TJ#:[id number]    ASSESSMENT & PLAN:  Locally advanced ovarian cancer Overall, she has positive response to treatment Even though the cystic lesion in the central pelvis is slightly worse, overall, clinically, she has responded well to treatment Due to significant PE, she is not a candidate for interval debulking surgery I have discussed the case briefly with GYN surgeon The plan will be to resume chemotherapy in 2 weeks in the outpatient setting For now, continue aggressive supportive care  Right lower extremity DVT and severe bilateral PE I recommend minimum 48 hours of IV heparin before transitioning her to Lovenox or oral anticoagulation treatment I have a long discussion with the daughter over the telephone She has concern about the patient's not able to self manage Lovenox twice a day We discussed the risk and benefits of oral anticoagulation agent versus Lovenox The patient will likely be transitioned to oral anticoagulant upon discharge, either Xarelto or Eliquis, depending on insurance coverage She will need minimum 3 months of anticoagulation therapy and interrupted before bridging therapy for possible debulking surgery in the future I also have extensive discussion with radiologist today who does not feel that the cystic mass is the cause of the blood clot.  The venous flow appears patent in her abdomen  Hydronephrosis secondary to ureteric compression from pelvic mass Her renal function is stable I do not recommend stent placement at this point We will monitor her renal function carefully  Mild pancytopenia Likely due to recent chemotherapy Observe only She does not need transfusion support  Code Status Full code  Goals of care Hopefully, she can be transition to oral anticoagulation therapy before discharge, with stable vitals  Discharge planning Hopefully the next 24 to 48 hours 78 will call the patient next week and set up follow-up  appointment Her daughter, the patient and her husband were all updated with plan of care  All questions were answered. The patient knows to call the clinic with any problems, questions or concerns.   [name], MD 76/12/2018 11:40 AM  Subjective:  Patient is well known to me. Admitted urgently yesterday due to large clot burden seen on CT imaging.  The CT scan was ordered for objective assessment of response to treatment for ovarian cancer The patient did complain of shortness of breath on exertion.  She denies leg pain or swelling.  This morning, ultrasound venous Doppler confirm significant DVT of the right lower extremity She denies chest pain or presyncopal episode at home Oncology History  Ovarian cancer ([place])  10/24/2018 Initial Diagnosis   Her symptoms began in April/May, 2020.  She has bloating and early satiety. Shortness of breath with walking. She denied bleeding. She reported constipation with pain with defecation and narrowed stools   11/29/2018 Imaging   1. 13 cm complex cystic lesion in the central pelvis, highly suspicious for ovarian cystadenocarcinoma. 2. Diffuse peritoneal carcinomatosis with mild ascites. 3. Mild lymphadenopathy in porta hepatis and right cardiophrenic angle, suspicious for metastatic disease. 4. Moderate right and tiny left pleural effusions   12/05/2018 Tumor Marker   Patient's tumor was tested for the following markers: CA-125 Results of the tumor marker test revealed 1015.   12/06/2018 Cancer Staging   Staging form: Ovary, Fallopian Tube, and Primary Peritoneal Carcinoma, AJCC 8th Edition - Clinical: FIGO Stage IVA, calculated as Stage IV (cT3c, cN1, cM1) - Signed by [name], MD on 12/06/2018   12/09/2018 Pathology Results   PLEURAL FLUID, RIGHT (  SPECIMEN 1 OF 1 COLLECTED 12/09/18): - MALIGNANT CELLS CONSISTENT WITH METASTATIC ADENOCARCINOMA - SEE COMMENT Comment The neoplastic cells are positive for cytokeratin 7 and Pax-8 but negative for  cytokeratin 20, TTF-1, CDX-2 and Gata-3. Overall, the phenotype is consistent with the clinical impression of gynecologic primary.    12/15/2018 Procedure   Placement of single lumen port a cath via right internal jugular vein. The catheter tip lies at the cavo-atrial junction. A power injectable port a cath was placed and is ready for immediate use   12/16/2018 -  Chemotherapy   The patient had carboplatin and taxol for chemotherapy treatment.     01/06/2019 Tumor Marker   Patient's tumor was tested for the following markers: CA-125 Results of the tumor marker test revealed 948   02/02/2019 Imaging   1. Massive pulmonary embolism, as discussed above. Given the mildly elevated RV to LV ratio of 0.95, this is associated with increased risk of morbidity and mortality. 2. Today's study demonstrates a mixed response to therapy. Specifically, while there has been regression of the bulky intraperitoneal metastatic disease and regression of previously noted pleural effusions, the large cystic mass in the central pelvis has increased in size compared to the prior study. 3. New right mild hydroureteronephrosis related to extrinsic compression on the distal third of the right ureter by the patient's large pelvic mass. 4. Scattered small pulmonary nodules (predominantly pleural based) appear stable compared to prior examinations. These are nonspecific but warrant continued attention on follow-up studies. 5. Aortic atherosclerosis, in addition to three-vessel coronary artery disease. Assessment for potential risk factor modification, dietary therapy or pharmacologic therapy may be warranted, if clinically indicated.      Objective:  Vitals:   02/03/19 0900 02/03/19 1000  BP:  (!) 155/72  Pulse: 96 87  Resp: 20 20  Temp:    SpO2: 98% 98%     Intake/Output Summary (Last 24 hours) at 02/03/2019 1140 Last data filed at 02/03/2019 0942 Gross per 24 hour  Intake 1497.45 ml  Output 1650 ml  Net -152.55 ml     GENERAL:alert, no distress and comfortable SKIN: skin color, texture, turgor are normal, no rashes or significant lesions EYES: normal, Conjunctiva are pink and non-injected, sclera clear OROPHARYNX:no exudate, no erythema and lips, buccal mucosa, and tongue normal  NECK: supple, thyroid normal size, non-tender, without nodularity LYMPH:  no palpable lymphadenopathy in the cervical, axillary or inguinal LUNGS: clear to auscultation and percussion with normal breathing effort HEART: regular rate & rhythm and no murmurs and no lower extremity edema ABDOMEN:abdomen soft, non-tender and normal bowel sounds Musculoskeletal:no cyanosis of digits and no clubbing  NEURO: alert & oriented x 3 with fluent speech, no focal motor/sensory deficits   Labs:  Recent Labs    12/15/18 0839  01/06/19 1040 01/27/19 0749 02/02/19 1140 02/03/19 0106  NA 139  --  139 139 135 136  K 4.2  --  5.1 4.4 3.8 3.6  CL 104  --  104 105 99 102  CO2 26  --  26 24 26 25   GLUCOSE 104*  --  132* 147* 103* 107*  BUN 23  --  24* 24* 18 18  CREATININE 0.89   < > 1.01* 0.99 0.71 0.93  CALCIUM 9.6  --  10.2 10.2 9.7 8.9  GFRNONAA >60   < > 54* 56* >60 >60  GFRAA >60   < > >60 >60 >60 >60  PROT 7.3  --  8.2* 7.8  --   --  ALBUMIN 3.5  --  3.6 3.5  --   --   AST 16  --  48* 21  --   --   ALT 16  --  70* 32  --   --   ALKPHOS 76  --  133* 121  --   --   BILITOT 0.3  --  0.2* 0.2*  --   --    < > = values in this interval not displayed.    Studies:  Ct Chest W Contrast  Result Date: 02/02/2019 CLINICAL DATA:  78 year old female with 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 Follow-up evaluation. EXAM: CT CHEST, ABDOMEN, AND PELVIS WITH CONTRAST TECHNIQUE: Multidetector CT imaging of the chest, abdomen and pelvis was performed following the standard protocol during bolus administration of intravenous contrast. CONTRAST:  143mL OMNIPAQUE  IOHEXOL 300 MG/ML  SOLN COMPARISON:  Chest CT 12/12/2018. FINDINGS: CT CHEST FINDINGS Cardiovascular: Heart size is normal. There is no significant pericardial fluid, thickening or pericardial calcification. There is aortic atherosclerosis, as well as atherosclerosis of the great vessels of the mediastinum and the coronary arteries, including calcified atherosclerotic plaque in the left anterior descending, left circumflex and right coronary arteries. Filling defects are noted in the pulmonary arterial tree bilaterally, indicative of widespread pulmonary embolus. This includes saddle embolus, main, lobar, segmental and subsegmental sized emboli bilaterally. The largest burden of clot appears in the right lower lobe pulmonary arterial tree where there is a combination of occlusive and nonocclusive thrombus. Right internal jugular single-lumen porta cath with tip terminating in the superior aspect of the right atrium. Right ventricle measures approximately 4.2 cm in diameter. Left ventricle measures approximately 4.4 cm in diameter. RV to LV ratio of 0.95. Mediastinum/Nodes: No pathologically enlarged mediastinal or hilar lymph nodes. Esophagus is unremarkable in appearance. No axillary lymphadenopathy. Lungs/Pleura: Multiple small predominantly pleural-based nodules are again noted throughout the lungs bilaterally, similar in size and number to the prior examination, nonspecific, but favored to be benign. No other larger more suspicious appearing pulmonary nodules or masses are noted. No acute consolidative airspace disease. Resolution of previously noted left pleural effusion. Small right pleural effusion has decreased in size compared to the prior study. Musculoskeletal: There are no aggressive appearing lytic or blastic lesions noted in the visualized portions of the skeleton. CT ABDOMEN PELVIS FINDINGS Hepatobiliary: No suspicious cystic or solid hepatic lesions. No intra or extrahepatic biliary ductal  dilatation. Gallbladder is normal in appearance. Pancreas: No pancreatic mass. No pancreatic ductal dilatation. No pancreatic or peripancreatic fluid collections or inflammatory changes are noted. Spleen: Unremarkable. Adrenals/Urinary Tract: Multiple low-attenuation lesions in the left renal hilum, most compatible with small parapelvic cysts. No suspicious right renal lesions. Mild right hydroureteronephrosis, new compared to the prior study, which appears related to external compression on the distal third of the right ureter by the patient's large pelvic mass (discussed below). No left hydroureteronephrosis. Urinary bladder is unremarkable in appearance. Bilateral adrenal glands are normal in appearance. Stomach/Bowel: Normal appearance of the stomach. No pathologic dilatation of small bowel or colon. Appendix is not confidently identified may be surgically absent or atrophic. Vascular/Lymphatic: Aortic atherosclerosis, without evidence of aneurysm or dissection in the abdominal or pelvic vasculature. Filling defect in the right superficial femoral vein incompletely imaged (axial image 137 of series 2), compatible with deep venous thrombosis. No lymphadenopathy noted in the abdomen or pelvis. Reproductive: Status post hysterectomy. Pessary in the vagina. Large cystic mass in the central  aspect of the pelvis which has increased in size compared to the prior examination, largest of which measures 15.7 x 11.0 x 12.4 cm (axial image 108 of series 2 and coronal image 70 of series 5), previously 13.4 x 9.8 cm on 11/29/2018. Other: There continues to be scattered areas of abnormal soft tissue throughout the peritoneal cavity, indicative of widespread intraperitoneal metastatic disease, bulkiest portion of which is visualized on axial image 94 of series 2, overall decreased in size compared to the prior study. Trace ascites in the low anatomic pelvis. No pneumoperitoneum. Musculoskeletal: There are no aggressive  appearing lytic or blastic lesions noted in the visualized portions of the skeleton. IMPRESSION: 1. Massive pulmonary embolism, as discussed above. Given the mildly elevated RV to LV ratio of 0.95, this is associated with increased risk of morbidity and mortality. 2. Today's study demonstrates a mixed response to therapy. Specifically, while there has been regression of the bulky intraperitoneal metastatic disease and regression of previously noted pleural effusions, the large cystic mass in the central pelvis has increased in size compared to the prior study. 3. New right mild hydroureteronephrosis related to extrinsic compression on the distal third of the right ureter by the patient's large pelvic mass. 4. Scattered small pulmonary nodules (predominantly pleural based) appear stable compared to prior examinations. These are nonspecific but warrant continued attention on follow-up studies. 5. Aortic atherosclerosis, in addition to three-vessel coronary artery disease. Assessment for potential risk factor modification, dietary therapy or pharmacologic therapy may be warranted, if clinically indicated. 6. Additional incidental findings, as above. Critical Value/emergent results were called by telephone at the time of interpretation on 02/02/2019 at 10:36 a.m. to Dr. [name] , who verbally acknowledged these results. Electronically Signed   By: Vinnie Langton M.D.   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 Follow-up evaluation. EXAM: CT CHEST, ABDOMEN, AND PELVIS WITH CONTRAST TECHNIQUE: Multidetector CT imaging of the chest, abdomen and pelvis was performed following the standard protocol during bolus administration of intravenous contrast. CONTRAST:  122mL OMNIPAQUE IOHEXOL 300 MG/ML  SOLN COMPARISON:  Chest CT  12/12/2018. FINDINGS: CT CHEST FINDINGS Cardiovascular: Heart size is normal. There is no significant pericardial fluid, thickening or pericardial calcification. There is aortic atherosclerosis, as well as atherosclerosis of the great vessels of the mediastinum and the coronary arteries, including calcified atherosclerotic plaque in the left anterior descending, left circumflex and right coronary arteries. Filling defects are noted in the pulmonary arterial tree bilaterally, indicative of widespread pulmonary embolus. This includes saddle embolus, main, lobar, segmental and subsegmental sized emboli bilaterally. The largest burden of clot appears in the right lower lobe pulmonary arterial tree where there is a combination of occlusive and nonocclusive thrombus. Right internal jugular single-lumen porta cath with tip terminating in the superior aspect of the right atrium. Right ventricle measures approximately 4.2 cm in diameter. Left ventricle measures approximately 4.4 cm in diameter. RV to LV ratio of 0.95. Mediastinum/Nodes: No pathologically enlarged mediastinal or hilar lymph nodes. Esophagus is unremarkable in appearance. No axillary lymphadenopathy. Lungs/Pleura: Multiple small predominantly pleural-based nodules are again noted throughout the lungs bilaterally, similar in size and number to the prior examination, nonspecific, but favored to be benign. No other larger more suspicious appearing pulmonary nodules or masses are noted. No acute consolidative airspace disease. Resolution of previously noted left pleural effusion. Small  right pleural effusion has decreased in size compared to the prior study. Musculoskeletal: There are no aggressive appearing lytic or blastic lesions noted in the visualized portions of the skeleton. CT ABDOMEN PELVIS FINDINGS Hepatobiliary: No suspicious cystic or solid hepatic lesions. No intra or extrahepatic biliary ductal dilatation. Gallbladder is normal in appearance.  Pancreas: No pancreatic mass. No pancreatic ductal dilatation. No pancreatic or peripancreatic fluid collections or inflammatory changes are noted. Spleen: Unremarkable. Adrenals/Urinary Tract: Multiple low-attenuation lesions in the left renal hilum, most compatible with small parapelvic cysts. No suspicious right renal lesions. Mild right hydroureteronephrosis, new compared to the prior study, which appears related to external compression on the distal third of the right ureter by the patient's large pelvic mass (discussed below). No left hydroureteronephrosis. Urinary bladder is unremarkable in appearance. Bilateral adrenal glands are normal in appearance. Stomach/Bowel: Normal appearance of the stomach. No pathologic dilatation of small bowel or colon. Appendix is not confidently identified may be surgically absent or atrophic. Vascular/Lymphatic: Aortic atherosclerosis, without evidence of aneurysm or dissection in the abdominal or pelvic vasculature. Filling defect in the right superficial femoral vein incompletely imaged (axial image 137 of series 2), compatible with deep venous thrombosis. No lymphadenopathy noted in the abdomen or pelvis. Reproductive: Status post hysterectomy. Pessary in the vagina. Large cystic mass in the central aspect of the pelvis which has increased in size compared to the prior examination, largest of which measures 15.7 x 11.0 x 12.4 cm (axial image 108 of series 2 and coronal image 70 of series 5), previously 13.4 x 9.8 cm on 11/29/2018. Other: There continues to be scattered areas of abnormal soft tissue throughout the peritoneal cavity, indicative of widespread intraperitoneal metastatic disease, bulkiest portion of which is visualized on axial image 94 of series 2, overall decreased in size compared to the prior study. Trace ascites in the low anatomic pelvis. No pneumoperitoneum. Musculoskeletal: There are no aggressive appearing lytic or blastic lesions noted in the  visualized portions of the skeleton. IMPRESSION: 1. Massive pulmonary embolism, as discussed above. Given the mildly elevated RV to LV ratio of 0.95, this is associated with increased risk of morbidity and mortality. 2. Today's study demonstrates a mixed response to therapy. Specifically, while there has been regression of the bulky intraperitoneal metastatic disease and regression of previously noted pleural effusions, the large cystic mass in the central pelvis has increased in size compared to the prior study. 3. New right mild hydroureteronephrosis related to extrinsic compression on the distal third of the right ureter by the patient's large pelvic mass. 4. Scattered small pulmonary nodules (predominantly pleural based) appear stable compared to prior examinations. These are nonspecific but warrant continued attention on follow-up studies. 5. Aortic atherosclerosis, in addition to three-vessel coronary artery disease. Assessment for potential risk factor modification, dietary therapy or pharmacologic therapy may be warranted, if clinically indicated. 6. Additional incidental findings, as above. Critical Value/emergent results were called by telephone at the time of interpretation on 02/02/2019 at 10:36 a.m. to Dr. [name] , who verbally acknowledged these results. Electronically Signed   By: Vinnie Langton M.D.   On: 02/02/2019 11:33   Vas [place] Lower Extremity Venous (dvt)  Result Date: 02/03/2019  Lower Venous Study Indications: Pulmonary embolism.  Risk Factors: Cancer. Anticoagulation: Heparin. Comparison Study: No prior studies. Performing Technologist: Oliver Hum RVT  Examination Guidelines: A complete evaluation includes B-mode imaging, spectral Doppler, color Doppler, and power Doppler as needed of all accessible portions of each vessel. Bilateral testing is considered  an integral part of a complete examination. Limited examinations for reoccurring indications may be performed as noted.   +---------+---------------+---------+-----------+----------+-------+ RIGHT    CompressibilityPhasicitySpontaneityPropertiesSummary +---------+---------------+---------+-----------+----------+-------+ CFV      Full           No       No                           +---------+---------------+---------+-----------+----------+-------+ SFJ      Full                                                 +---------+---------------+---------+-----------+----------+-------+ FV Prox  Partial        No       No                   Acute   +---------+---------------+---------+-----------+----------+-------+ FV Mid   None           No       No                   Acute   +---------+---------------+---------+-----------+----------+-------+ FV DistalPartial        No       No                   Acute   +---------+---------------+---------+-----------+----------+-------+ POP      Partial        No       No                   Acute   +---------+---------------+---------+-----------+----------+-------+ PTV      Full                                                 +---------+---------------+---------+-----------+----------+-------+ PERO     Partial                                      Acute   +---------+---------------+---------+-----------+----------+-------+ Soleal   Partial                                      Acute   +---------+---------------+---------+-----------+----------+-------+ Gastroc  Partial                                      Acute   +---------+---------------+---------+-----------+----------+-------+   +---------+---------------+---------+-----------+----------+-------+ LEFT     CompressibilityPhasicitySpontaneityPropertiesSummary +---------+---------------+---------+-----------+----------+-------+ CFV      Full           Yes      Yes                          +---------+---------------+---------+-----------+----------+-------+ SFJ      Full                                                  +---------+---------------+---------+-----------+----------+-------+  FV Prox  Full                                                 +---------+---------------+---------+-----------+----------+-------+ FV Mid   Full                                                 +---------+---------------+---------+-----------+----------+-------+ FV DistalFull                                                 +---------+---------------+---------+-----------+----------+-------+ PFV      Full                                                 +---------+---------------+---------+-----------+----------+-------+ POP      Full           Yes      Yes                          +---------+---------------+---------+-----------+----------+-------+ PTV      Full                                                 +---------+---------------+---------+-----------+----------+-------+ PERO     Full                                                 +---------+---------------+---------+-----------+----------+-------+     Summary: Right: Findings consistent with acute deep vein thrombosis involving the right femoral vein, right popliteal vein, right peroneal veins, right soleal veins, and right gastrocnemius veins. No cystic structure found in the popliteal fossa. Left: There is no evidence of deep vein thrombosis in the lower extremity. No cystic structure found in the popliteal fossa.  *See table(s) above for measurements and observations.    Preliminary

## 2019-02-03 NOTE — Progress Notes (Signed)
Patient transferred from ICU. Alert and oriented x 4. Vital signs stable. Placed on tele. RN agrees with previous nurse assessment. RN will continue to monitor.

## 2019-02-03 NOTE — Progress Notes (Addendum)
ANTICOAGULATION CONSULT NOTE - Follow Up Consult  Pharmacy Consult for heparin Indication: acute pulmonary embolus and DVT  No Known Allergies  Patient Measurements: Height: 5\' 7"  (170.2 cm) Weight: 212 lb (96.2 kg) IBW/kg (Calculated) : 61.6 Heparin Dosing Weight: 82 kg  Vital Signs: Temp: 98 F (36.7 C) (08/07 0800) Temp Source: Oral (08/07 0800) BP: 94/52 (08/07 0800) Pulse Rate: 77 (08/07 0800)  Labs: Recent Labs    02/02/19 1140 02/02/19 2030 02/03/19 0105 02/03/19 0106  HGB 11.4*  --   --  9.7*  HCT 34.5*  --   --  29.5*  PLT 135*  --   --  129*  HEPARINUNFRC  --  0.35 0.39  --   CREATININE 0.71  --   --  0.93    Estimated Creatinine Clearance: 62.2 mL/min (by C-G formula based on SCr of 0.93 mg/dL).   Assessment: Patient is a 78 y.o F with metastatic ovarian cancer on chemotherapy PTA (had chemo on 7/31) admitted to ED from the radiology department after she was found to have acute bilateral PE. She was started on heparin drip on admission. Pre MD's request, to transition to lovenox on 8/7.  Today, 02/03/2019: - hgb down 9.7, plts 129 - no bleeding documented - scr ok (crcl~62)  Goal of Therapy:  Anti-Xa level 0.6-1 units/ml 4hrs after LMWH dose given Monitor platelets by anticoagulation protocol: Yes   Plan:  - d/c heparin drip - start lovenox 60 mg SQ q12h one hour after heparin drip has been d/ced - continue daily cbc for now - monitor for s/s bleeding  Dakisha Schoof P 02/03/2019,8:43 AM ___________________________________ Adden (9:37a): MD decides to continue with heparin drip for now.  RN has not turned heparin drip off. Continue with heparin drip at 1300 units/hr. F/u with level at noon today and adjust if needed.  Adden (12:59p): Heparin level now back therapeutic at 0.46.  Will continue with current rate of 1300 units/hr - f/u with AM labs and adjust if needed  Dia Sitter, PharmD, BCPS 02/03/2019 9:39 AM

## 2019-02-04 LAB — BASIC METABOLIC PANEL
Anion gap: 11 (ref 5–15)
BUN: 13 mg/dL (ref 8–23)
CO2: 23 mmol/L (ref 22–32)
Calcium: 9.7 mg/dL (ref 8.9–10.3)
Chloride: 103 mmol/L (ref 98–111)
Creatinine, Ser: 0.88 mg/dL (ref 0.44–1.00)
GFR calc Af Amer: >60 mL/min (ref >60)
GFR calc non Af Amer: >60 mL/min (ref >60)
Glucose, Bld: 173 mg/dL — ABNORMAL HIGH (ref 70–99)
Potassium: 3.8 mmol/L (ref 3.5–5.1)
Sodium: 137 mmol/L (ref 135–145)

## 2019-02-04 LAB — CBC
HCT: 33.2 % — ABNORMAL LOW (ref 36.0–46.0)
Hemoglobin: 10.9 g/dL — ABNORMAL LOW (ref 12.0–15.0)
MCH: 30.7 pg (ref 26.0–34.0)
MCHC: 32.8 g/dL (ref 30.0–36.0)
MCV: 93.5 fL (ref 80.0–100.0)
Platelets: 188 10*3/uL (ref 150–400)
RBC: 3.55 MIL/uL — ABNORMAL LOW (ref 3.87–5.11)
RDW: 14 % (ref 11.5–15.5)
WBC: 2.9 10*3/uL — ABNORMAL LOW (ref 4.0–10.5)
nRBC: 0 % (ref 0.0–0.2)

## 2019-02-04 LAB — HEPARIN LEVEL (UNFRACTIONATED): Heparin Unfractionated: 0.49 IU/mL (ref 0.30–0.70)

## 2019-02-04 MED ORDER — APIXABAN 5 MG PO TABS: ORAL_TABLET | ORAL | 0 refills | Status: DC

## 2019-02-04 MED ORDER — HEPARIN SOD (PORK) LOCK FLUSH 100 UNIT/ML IV SOLN
500.0000 [IU] | INTRAVENOUS | Status: AC | PRN
  Administered 2019-02-04: 500 [IU]

## 2019-02-04 MED ORDER — APIXABAN 5 MG PO TABS: 5.0000 mg | ORAL_TABLET | Freq: Two times a day (BID) | ORAL | Status: DC

## 2019-02-04 MED ORDER — APIXABAN 5 MG PO TABS
10.0000 mg | ORAL_TABLET | Freq: Two times a day (BID) | ORAL | Status: DC
  Administered 2019-02-04: 10 mg via ORAL
  Filled 2019-02-04: qty 2

## 2019-02-04 MED ORDER — SODIUM CHLORIDE 0.9% FLUSH: 10.0000 mL | INTRAVENOUS | Status: DC | PRN

## 2019-02-04 NOTE — Progress Notes (Signed)
Pt being discharged this evening with husband. Discharge instructions explained/given. Pt verbalized understanding of discharge instructions. Pt aware of follow-up appointments.

## 2019-02-04 NOTE — Discharge Summary (Signed)
Physician Discharge Summary  DICY SMIGEL Paula Peterson:[id number] DOB: 04/19/1941 DOA: 02/02/2019  PCP: Paula Bis, MD  Admit date: 02/02/2019 Discharge date: 02/04/2019  Admitted From: home Disposition:  home  Recommendations for Outpatient Follow-up:  1. Follow up with PCP in 1-2 weeks 2. Please obtain CBC/BMP/Mag at follow up 3. Please follow up on the following pending results: none   PA [id number]  [id number]. *  PA [id number]. Submitted to clinical pharmacy for urgent revision.   Home Health: none Equipment/Devices: none  Discharge Condition: stable CODE STATUS: full code  Hospital Course: 78 year old female with history of HTN, hypothyroidism, stage IV ovarian cancer with metastasis to chest and peritoneum who was found to have acute bilateral PE during the routine cancer staging CT. Had some progressive DOE. Admitted for PE with some heart strain on CTA chest.  Lower extremity venous Doppler revealed DVT in right lower extremity. Echo with normal EF and no signs of heart strain. Started on heparin gtt and remained hemodynamically stable. Seen by Oncology, Dr. Alvy Bimler.  After discussion about treatment options including risks and benefits with oncologist, Dr. Alvy Bimler, she was transitioned to Eliquis and discharged home on Eliquis.  When I called [facility] in [place] to follow-up on a prescription, I was told Eliquis requires prior authorization.  I called the [facility] at [phone] and obtain prior authorization with PA number [id number].  I called patient's daughter to call her pharmacy and make sure a prescription is filled.  I also advised her to call me back if there is any problem with the prescription.  Patient was ambulated on room air and maintained good saturation at 99% without significant distress prior to discharge.  Patient to follow-up with PCP and oncology as recommended.  Per oncology, she will need minimum 3 months of anticoagulation therapy uninterrupted before  bridging therapy for possible debulking surgery in the future.  See individual problem list below for more.  Discharge Diagnoses:  "Massive" pulmonary embolism/RLE DVT: CTA chest revealed PE with RV to LV ratio of 0.95.  No evidence of right heart strain on echocardiogram.  Patient remained stable on IV heparin for 48 hours and transition to Eliquis as above. -Discharged on Eliquis.  H&H stable.  She will follow-up with PCP and oncology. -Oncology to set up follow-up appointment  Locally advanced ovarian cancer with peritoneal and chest metastasis -Plan to resume chemotherapy in 2 weeks in outpatient setting per oncology. -Not a candidate for interval debulking surgery at this time due to significant PE.  Right hydroureteronephrosis related to extrinsic compression of the distal third of the right ureter by pelvic mass.  Patient had no urinary symptoms or flank pain.  Pancytopenia: Stable -Per oncology  Hypertension: Normotensive -Discharged on home medications  Hypothyroidism: Stable -Discharged on home medications  SARS-CoV-2 screen negative.  Discharge Instructions  Discharge Instructions    Call MD for:  persistant dizziness or light-headedness   Complete by: As directed    Call MD for:  persistant nausea and vomiting   Complete by: As directed    Call MD for:  severe uncontrolled pain   Complete by: As directed    Call MD for:  temperature >100.4   Complete by: As directed    Diet - low sodium heart healthy   Complete by: As directed    Discharge instructions   Complete by: As directed    It has been a pleasure taking care of you! You were admitted with pulmonary embolism and DVT (blood  clot in your lung and your right leg).  You were treated with blood thinner.  You are discharged on more blood thinner that you need to continue taking.  We strongly recommend you avoid any over-the-counter pain medications other than plain Tylenol.  Please review your new medication  list and the directions before you take your medications. Please call your primary care office as soon as possible to schedule hospital follow-up visit in 1 to 2 weeks. Please go to your appointments with your oncologist as previously planned.  Take care,   Increase activity slowly   Complete by: As directed      Allergies as of 02/04/2019   No Known Allergies     Medication List    TAKE these medications   amLODipine 2.5 MG tablet Commonly known as: NORVASC Take 2.5 mg by mouth daily.   apixaban 5 MG Tabs tablet Commonly known as: ELIQUIS Take 2 tablets (10 mg total) by mouth 2 (two) times daily for 7 days, THEN 1 tablet (5 mg total) 2 (two) times daily for 23 days. Start taking on: February 04, 2019   dexamethasone 4 MG tablet Commonly known as: DECADRON Take 2 tabs at the night before and 2 tab the morning of chemotherapy, every 3 weeks, by mouth with food What changed:   how much to take  how to take this  when to take this  additional instructions   levothyroxine 25 MCG tablet Commonly known as: SYNTHROID Take 1 tablet (25 mcg total) by mouth daily before breakfast.   lidocaine-prilocaine cream Commonly known as: EMLA Apply to affected area once What changed:   how much to take  how to take this  when to take this  reasons to take this  additional instructions   losartan 50 MG tablet Commonly known as: COZAAR Take 50 mg by mouth daily.   omeprazole 20 MG capsule Commonly known as: PRILOSEC Take 20 mg by mouth daily.   ondansetron 8 MG tablet Commonly known as: Zofran Take 1 tablet (8 mg total) by mouth every 8 (eight) hours as needed for refractory nausea / vomiting.   polyethylene glycol 17 g packet Commonly known as: MIRALAX / GLYCOLAX Take 17 g by mouth daily as needed for mild constipation.   prochlorperazine 10 MG tablet Commonly known as: COMPAZINE Take 1 tablet (10 mg total) by mouth every 6 (six) hours as needed (Nausea or  vomiting).   traMADol 50 MG tablet Commonly known as: ULTRAM Take 1 tablet (50 mg total) by mouth every 6 (six) hours as needed. What changed: reasons to take this      Follow-up Information    Paula Bis, MD. Schedule an appointment as soon as possible for a visit in 1 week(s).   Specialty: Family Medicine Contact information: [place] [address] [phone]           Consultations:  Oncology  Procedures/Studies: 2D Echo:  1. The left ventricle has normal systolic function with an ejection fraction of 60-65%. The cavity size was normal. Left ventricular diastolic Doppler parameters are consistent with impaired relaxation. 2. The right ventricle has normal systolic function. The cavity was normal. 3. The aortic valve is tricuspid. Mild calcification of the aortic valve. Aortic valve regurgitation is mild by color flow Doppler. No stenosis of the aortic valve. 4. The aorta is normal in size and structure. 5. Normal LV systolic function; mild proximal septal thickening; mild diastolic dysfunction; mild AI.  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 Follow-up evaluation. EXAM: CT CHEST, ABDOMEN, AND PELVIS WITH CONTRAST TECHNIQUE: Multidetector CT imaging of the chest, abdomen and pelvis was performed following the standard protocol during bolus administration of intravenous contrast. CONTRAST:  146mL OMNIPAQUE IOHEXOL 300 MG/ML  SOLN COMPARISON:  Chest CT 12/12/2018. FINDINGS: CT CHEST FINDINGS Cardiovascular: Heart size is normal. There is no significant pericardial fluid, thickening or pericardial calcification. There is aortic atherosclerosis, as well as atherosclerosis of the great vessels of the mediastinum and the coronary arteries, including calcified atherosclerotic plaque in the left anterior  descending, left circumflex and right coronary arteries. Filling defects are noted in the pulmonary arterial tree bilaterally, indicative of widespread pulmonary embolus. This includes saddle embolus, main, lobar, segmental and subsegmental sized emboli bilaterally. The largest burden of clot appears in the right lower lobe pulmonary arterial tree where there is a combination of occlusive and nonocclusive thrombus. Right internal jugular single-lumen porta cath with tip terminating in the superior aspect of the right atrium. Right ventricle measures approximately 4.2 cm in diameter. Left ventricle measures approximately 4.4 cm in diameter. RV to LV ratio of 0.95. Mediastinum/Nodes: No pathologically enlarged mediastinal or hilar lymph nodes. Esophagus is unremarkable in appearance. No axillary lymphadenopathy. Lungs/Pleura: Multiple small predominantly pleural-based nodules are again noted throughout the lungs bilaterally, similar in size and number to the prior examination, nonspecific, but favored to be benign. No other larger more suspicious appearing pulmonary nodules or masses are noted. No acute consolidative airspace disease. Resolution of previously noted left pleural effusion. Small right pleural effusion has decreased in size compared to the prior study. Musculoskeletal: There are no aggressive appearing lytic or blastic lesions noted in the visualized portions of the skeleton. CT ABDOMEN PELVIS FINDINGS Hepatobiliary: No suspicious cystic or solid hepatic lesions. No intra or extrahepatic biliary ductal dilatation. Gallbladder is normal in appearance. Pancreas: No pancreatic mass. No pancreatic ductal dilatation. No pancreatic or peripancreatic fluid collections or inflammatory changes are noted. Spleen: Unremarkable. Adrenals/Urinary Tract: Multiple low-attenuation lesions in the left renal hilum, most compatible with small parapelvic cysts. No suspicious right renal lesions. Mild right  hydroureteronephrosis, new compared to the prior study, which appears related to external compression on the distal third of the right ureter by the patient's large pelvic mass (discussed below). No left hydroureteronephrosis. Urinary bladder is unremarkable in appearance. Bilateral adrenal glands are normal in appearance. Stomach/Bowel: Normal appearance of the stomach. No pathologic dilatation of small bowel or colon. Appendix is not confidently identified may be surgically absent or atrophic. Vascular/Lymphatic: Aortic atherosclerosis, without evidence of aneurysm or dissection in the abdominal or pelvic vasculature. Filling defect in the right superficial femoral vein incompletely imaged (axial image 137 of series 2), compatible with deep venous thrombosis. No lymphadenopathy noted in the abdomen or pelvis. Reproductive: Status post hysterectomy. Pessary in the vagina. Large cystic mass in the central aspect of the pelvis which has increased in size compared to the prior examination, largest of which measures 15.7 x 11.0 x 12.4 cm (axial image 108 of series 2 and coronal image 70 of series 5), previously 13.4 x 9.8 cm on 11/29/2018. Other: There continues to be scattered areas of abnormal soft tissue throughout the peritoneal cavity, indicative of widespread intraperitoneal metastatic disease, bulkiest portion of which is visualized on axial image 94 of series 2, overall decreased in size compared to the prior study.  Trace ascites in the low anatomic pelvis. No pneumoperitoneum. Musculoskeletal: There are no aggressive appearing lytic or blastic lesions noted in the visualized portions of the skeleton. IMPRESSION: 1. Massive pulmonary embolism, as discussed above. Given the mildly elevated RV to LV ratio of 0.95, this is associated with increased risk of morbidity and mortality. 2. Today's study demonstrates a mixed response to therapy. Specifically, while there has been regression of the bulky intraperitoneal  metastatic disease and regression of previously noted pleural effusions, the large cystic mass in the central pelvis has increased in size compared to the prior study. 3. New right mild hydroureteronephrosis related to extrinsic compression on the distal third of the right ureter by the patient's large pelvic mass. 4. Scattered small pulmonary nodules (predominantly pleural based) appear stable compared to prior examinations. These are nonspecific but warrant continued attention on follow-up studies. 5. Aortic atherosclerosis, in addition to three-vessel coronary artery disease. Assessment for potential risk factor modification, dietary therapy or pharmacologic therapy may be warranted, if clinically indicated. 6. Additional incidental findings, as above. Critical Value/emergent results were called by telephone at the time of interpretation on 02/02/2019 at 10:36 a.m. to Dr. [name] , who verbally acknowledged these results. Electronically Signed   By: Vinnie Langton M.D.   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 Follow-up evaluation. EXAM: CT CHEST, ABDOMEN, AND PELVIS WITH CONTRAST TECHNIQUE: Multidetector CT imaging of the chest, abdomen and pelvis was performed following the standard protocol during bolus administration of intravenous contrast. CONTRAST:  156mL OMNIPAQUE IOHEXOL 300 MG/ML  SOLN COMPARISON:  Chest CT 12/12/2018. FINDINGS: CT CHEST FINDINGS Cardiovascular: Heart size is normal. There is no significant pericardial fluid, thickening or pericardial calcification. There is aortic atherosclerosis, as well as atherosclerosis of the great vessels of the mediastinum and the coronary arteries, including calcified atherosclerotic plaque in the left anterior descending, left circumflex and right coronary  arteries. Filling defects are noted in the pulmonary arterial tree bilaterally, indicative of widespread pulmonary embolus. This includes saddle embolus, main, lobar, segmental and subsegmental sized emboli bilaterally. The largest burden of clot appears in the right lower lobe pulmonary arterial tree where there is a combination of occlusive and nonocclusive thrombus. Right internal jugular single-lumen porta cath with tip terminating in the superior aspect of the right atrium. Right ventricle measures approximately 4.2 cm in diameter. Left ventricle measures approximately 4.4 cm in diameter. RV to LV ratio of 0.95. Mediastinum/Nodes: No pathologically enlarged mediastinal or hilar lymph nodes. Esophagus is unremarkable in appearance. No axillary lymphadenopathy. Lungs/Pleura: Multiple small predominantly pleural-based nodules are again noted throughout the lungs bilaterally, similar in size and number to the prior examination, nonspecific, but favored to be benign. No other larger more suspicious appearing pulmonary nodules or masses are noted. No acute consolidative airspace disease. Resolution of previously noted left pleural effusion. Small right pleural effusion has decreased in size compared to the prior study. Musculoskeletal: There are no aggressive appearing lytic or blastic lesions noted in the visualized portions of the skeleton. CT ABDOMEN PELVIS FINDINGS Hepatobiliary: No suspicious cystic or solid hepatic lesions. No intra or extrahepatic biliary ductal dilatation. Gallbladder is normal in appearance. Pancreas: No pancreatic mass. No pancreatic ductal dilatation. No pancreatic or peripancreatic fluid collections or inflammatory changes are noted. Spleen: Unremarkable. Adrenals/Urinary Tract: Multiple low-attenuation lesions in the left renal hilum, most compatible  with small parapelvic cysts. No suspicious right renal lesions. Mild right hydroureteronephrosis, new compared to the prior study, which  appears related to external compression on the distal third of the right ureter by the patient's large pelvic mass (discussed below). No left hydroureteronephrosis. Urinary bladder is unremarkable in appearance. Bilateral adrenal glands are normal in appearance. Stomach/Bowel: Normal appearance of the stomach. No pathologic dilatation of small bowel or colon. Appendix is not confidently identified may be surgically absent or atrophic. Vascular/Lymphatic: Aortic atherosclerosis, without evidence of aneurysm or dissection in the abdominal or pelvic vasculature. Filling defect in the right superficial femoral vein incompletely imaged (axial image 137 of series 2), compatible with deep venous thrombosis. No lymphadenopathy noted in the abdomen or pelvis. Reproductive: Status post hysterectomy. Pessary in the vagina. Large cystic mass in the central aspect of the pelvis which has increased in size compared to the prior examination, largest of which measures 15.7 x 11.0 x 12.4 cm (axial image 108 of series 2 and coronal image 70 of series 5), previously 13.4 x 9.8 cm on 11/29/2018. Other: There continues to be scattered areas of abnormal soft tissue throughout the peritoneal cavity, indicative of widespread intraperitoneal metastatic disease, bulkiest portion of which is visualized on axial image 94 of series 2, overall decreased in size compared to the prior study. Trace ascites in the low anatomic pelvis. No pneumoperitoneum. Musculoskeletal: There are no aggressive appearing lytic or blastic lesions noted in the visualized portions of the skeleton. IMPRESSION: 1. Massive pulmonary embolism, as discussed above. Given the mildly elevated RV to LV ratio of 0.95, this is associated with increased risk of morbidity and mortality. 2. Today's study demonstrates a mixed response to therapy. Specifically, while there has been regression of the bulky intraperitoneal metastatic disease and regression of previously noted pleural  effusions, the large cystic mass in the central pelvis has increased in size compared to the prior study. 3. New right mild hydroureteronephrosis related to extrinsic compression on the distal third of the right ureter by the patient's large pelvic mass. 4. Scattered small pulmonary nodules (predominantly pleural based) appear stable compared to prior examinations. These are nonspecific but warrant continued attention on follow-up studies. 5. Aortic atherosclerosis, in addition to three-vessel coronary artery disease. Assessment for potential risk factor modification, dietary therapy or pharmacologic therapy may be warranted, if clinically indicated. 6. Additional incidental findings, as above. Critical Value/emergent results were called by telephone at the time of interpretation on 02/02/2019 at 10:36 a.m. to Dr. [name] , who verbally acknowledged these results. Electronically Signed   By: Vinnie Langton M.D.   On: 02/02/2019 11:33   Vas [place] Lower Extremity Venous (dvt)  Result Date: 02/03/2019  Lower Venous Study Indications: Pulmonary embolism.  Risk Factors: Cancer. Anticoagulation: Heparin. Comparison Study: No prior studies. Performing Technologist: Oliver Hum RVT  Examination Guidelines: A complete evaluation includes B-mode imaging, spectral Doppler, color Doppler, and power Doppler as needed of all accessible portions of each vessel. Bilateral testing is considered an integral part of a complete examination. Limited examinations for reoccurring indications may be performed as noted.  +---------+---------------+---------+-----------+----------+-------+ RIGHT    CompressibilityPhasicitySpontaneityPropertiesSummary +---------+---------------+---------+-----------+----------+-------+ CFV      Full           No       No                           +---------+---------------+---------+-----------+----------+-------+ SFJ      Full                                                  +---------+---------------+---------+-----------+----------+-------+  FV Prox  Partial        No       No                   Acute   +---------+---------------+---------+-----------+----------+-------+ FV Mid   None           No       No                   Acute   +---------+---------------+---------+-----------+----------+-------+ FV DistalPartial        No       No                   Acute   +---------+---------------+---------+-----------+----------+-------+ POP      Partial        No       No                   Acute   +---------+---------------+---------+-----------+----------+-------+ PTV      Full                                                 +---------+---------------+---------+-----------+----------+-------+ PERO     Partial                                      Acute   +---------+---------------+---------+-----------+----------+-------+ Soleal   Partial                                      Acute   +---------+---------------+---------+-----------+----------+-------+ Gastroc  Partial                                      Acute   +---------+---------------+---------+-----------+----------+-------+   +---------+---------------+---------+-----------+----------+-------+ LEFT     CompressibilityPhasicitySpontaneityPropertiesSummary +---------+---------------+---------+-----------+----------+-------+ CFV      Full           Yes      Yes                          +---------+---------------+---------+-----------+----------+-------+ SFJ      Full                                                 +---------+---------------+---------+-----------+----------+-------+ FV Prox  Full                                                 +---------+---------------+---------+-----------+----------+-------+ FV Mid   Full                                                 +---------+---------------+---------+-----------+----------+-------+ FV DistalFull                                                  +---------+---------------+---------+-----------+----------+-------+  PFV      Full                                                 +---------+---------------+---------+-----------+----------+-------+ POP      Full           Yes      Yes                          +---------+---------------+---------+-----------+----------+-------+ PTV      Full                                                 +---------+---------------+---------+-----------+----------+-------+ PERO     Full                                                 +---------+---------------+---------+-----------+----------+-------+     Summary: Right: Findings consistent with acute deep vein thrombosis involving the right femoral vein, right popliteal vein, right peroneal veins, right soleal veins, and right gastrocnemius veins. No cystic structure found in the popliteal fossa. Left: There is no evidence of deep vein thrombosis in the lower extremity. No cystic structure found in the popliteal fossa.  *See table(s) above for measurements and observations.    Preliminary      Subjective: No major events overnight of this morning.  She says she had a restful night.  Denies chest pain, dyspnea, palpitation, lightheadedness, nausea, vomiting or abdominal pain.  Discharge Exam: Vitals:   02/03/19 2036 02/04/19 0419  BP: (!) 143/75 131/81  Pulse: 92 79  Resp: 19 18  Temp: 98.7 F (37.1 C) 98.6 F (37 C)  SpO2: 98% 96%    GENERAL: No acute distress.  Appears well.  HEENT: MMM.  Vision and hearing grossly intact.  NECK: Supple.  No apparent JVD. LUNGS:  No IWOB. Good air movement bilaterally. HEART:  RRR. Heart sounds normal.  ABD: Bowel sounds present. Soft. Non tender.  MSK/EXT:  Moves all extremities. No apparent deformity. No edema bilaterally. SKIN: no apparent skin lesion or wound NEURO: Awake, alert and oriented appropriately.  No gross deficit.  PSYCH:  Calm. Normal affect.    The results of significant diagnostics from this hospitalization (including imaging, microbiology, ancillary and laboratory) are listed below for reference.     Microbiology: Recent Results (from the past 240 hour(s))  SARS Coronavirus 2 [facility] order, Performed in [facility] hospital lab) Nasopharyngeal Nasopharyngeal Swab     Status: None   Collection Time: 02/02/19 12:27 PM   Specimen: Nasopharyngeal Swab  Result Value Ref Range Status   SARS Coronavirus 2 NEGATIVE NEGATIVE Final    Comment: (NOTE) If result is NEGATIVE SARS-CoV-2 target nucleic acids are NOT DETECTED. The SARS-CoV-2 RNA is generally detectable in upper and lower  respiratory specimens during the acute phase of infection. The lowest  concentration of SARS-CoV-2 viral copies this assay can detect is 250  copies / mL. A negative result does not preclude SARS-CoV-2 infection  and should not be used as the sole basis for treatment or other  patient  management decisions.  A negative result may occur with  improper specimen collection / handling, submission of specimen other  than nasopharyngeal swab, presence of viral mutation(s) within the  areas targeted by this assay, and inadequate number of viral copies  (<250 copies / mL). A negative result must be combined with clinical  observations, patient history, and epidemiological information. If result is POSITIVE SARS-CoV-2 target nucleic acids are DETECTED. The SARS-CoV-2 RNA is generally detectable in upper and lower  respiratory specimens dur ing the acute phase of infection.  Positive  results are indicative of active infection with SARS-CoV-2.  Clinical  correlation with patient history and other diagnostic information is  necessary to determine patient infection status.  Positive results do  not rule out bacterial infection or co-infection with other viruses. If result is PRESUMPTIVE POSTIVE SARS-CoV-2 nucleic acids MAY BE PRESENT.    A presumptive positive result was obtained on the submitted specimen  and confirmed on repeat testing.  While 2019 novel coronavirus  (SARS-CoV-2) nucleic acids may be present in the submitted sample  additional confirmatory testing may be necessary for epidemiological  and / or clinical management purposes  to differentiate between  SARS-CoV-2 and other Sarbecovirus currently known to infect humans.  If clinically indicated additional testing with an alternate test  methodology [phone]) is advised. The SARS-CoV-2 RNA is generally  detectable in upper and lower respiratory sp ecimens during the acute  phase of infection. The expected result is Negative. Fact Sheet for Patients:  StrictlyIdeas.no Fact Sheet for Healthcare Providers: BankingDealers.co.za This test is not yet approved or cleared by the [place] FDA and has been authorized for detection and/or diagnosis of SARS-CoV-2 by FDA under an Emergency Use Authorization (EUA).  This EUA will remain in effect (meaning this test can be used) for the duration of the COVID-19 declaration under Section 564(b)(1) of the Act, 21 U.S.C. section 360bbb-3(b)(1), unless the authorization is terminated or revoked sooner. Performed at [facility], [place] [address], [place], [place] [address]   MRSA PCR Screening     Status: None   Collection Time: 02/02/19  4:46 PM   Specimen: Nasal Mucosa; Nasopharyngeal  Result Value Ref Range Status   MRSA by PCR NEGATIVE NEGATIVE Final    Comment:        The GeneXpert MRSA Assay (FDA approved for NASAL specimens only), is one component of a comprehensive MRSA colonization surveillance program. It is not intended to diagnose MRSA infection nor to guide or monitor treatment for MRSA infections. Performed at [facility], [place] [address], [place], [place] [address]      Labs: BNP (last 3 results) No results  for input(s): BNP in the last 8760 hours. Basic Metabolic Panel: Recent Labs  Lab 02/02/19 1140 02/03/19 0106 02/04/19 0849  NA 135 136 137  K 3.8 3.6 3.8  CL 99 102 103  CO2 26 25 23   GLUCOSE 103* 107* 173*  BUN 18 18 13   CREATININE 0.71 0.93 0.88  CALCIUM 9.7 8.9 9.7   Liver Function Tests: No results for input(s): AST, ALT, ALKPHOS, BILITOT, PROT, ALBUMIN in the last 168 hours. No results for input(s): LIPASE, AMYLASE in the last 168 hours. No results for input(s): AMMONIA in the last 168 hours. CBC: Recent Labs  Lab 02/02/19 1140 02/03/19 0106 02/04/19 0849  WBC 3.9* 3.0* 2.9*  NEUTROABS 2.0  --   --   HGB 11.4* 9.7* 10.9*  HCT 34.5* 29.5* 33.2*  MCV 93.0  93.4 93.5  PLT 135* 129* 188   Cardiac Enzymes: No results for input(s): CKTOTAL, CKMB, CKMBINDEX, TROPONINI in the last 168 hours. BNP: Invalid input(s): POCBNP CBG: No results for input(s): GLUCAP in the last 168 hours. D-Dimer No results for input(s): DDIMER in the last 72 hours. Hgb A1c No results for input(s): HGBA1C in the last 72 hours. Lipid Profile No results for input(s): CHOL, HDL, LDLCALC, TRIG, CHOLHDL, LDLDIRECT in the last 72 hours. Thyroid function studies No results for input(s): TSH, T4TOTAL, T3FREE, THYROIDAB in the last 72 hours.  Invalid input(s): FREET3 Anemia work up No results for input(s): VITAMINB12, FOLATE, FERRITIN, TIBC, IRON, RETICCTPCT in the last 72 hours. Urinalysis    Component Value Date/Time   COLORURINE YELLOW 04/08/2013 2040   APPEARANCEUR CLEAR 04/08/2013 2040   LABSPEC 1.007 04/08/2013 2040   PHURINE 6.5 04/08/2013 2040   GLUCOSEU NEGATIVE 04/08/2013 2040   HGBUR NEGATIVE 04/08/2013 2040   BILIRUBINUR NEGATIVE 04/08/2013 2040   KETONESUR NEGATIVE 04/08/2013 2040   PROTEINUR NEGATIVE 04/08/2013 2040   UROBILINOGEN 0.2 04/08/2013 2040   NITRITE NEGATIVE 04/08/2013 2040   LEUKOCYTESUR NEGATIVE 04/08/2013 2040   Sepsis Labs Invalid input(s): PROCALCITONIN,   WBC,  LACTICIDVEN   Time coordinating discharge: 35 minutes  SIGNED:  Mercy Riding, MD  [facility] 02/04/2019, 2:39 PM  If 7PM-7AM, please contact night-coverage [email or web address] Password [email or web address]

## 2019-02-04 NOTE — Progress Notes (Signed)
ANTICOAGULATION CONSULT NOTE - Follow Up Consult  Pharmacy Consult for heparin - transition to apixaban 8/8 Indication: acute pulmonary embolus and DVT  No Known Allergies  Patient Measurements: Height: 5\' 7"  (170.2 cm) Weight: 212 lb (96.2 kg) IBW/kg (Calculated) : 61.6 Heparin Dosing Weight: 82 kg  Vital Signs: Temp: 98.6 F (37 C) (08/08 0419) BP: 131/81 (08/08 0419) Pulse Rate: 79 (08/08 0419)  Labs: Recent Labs    02/02/19 1140 02/02/19 2030 02/03/19 0105 02/03/19 0106 02/03/19 1150 02/04/19 0849  HGB 11.4*  --   --  9.7*  --  10.9*  HCT 34.5*  --   --  29.5*  --  33.2*  PLT 135*  --   --  129*  --  188  HEPARINUNFRC  --  0.35 0.39  --  0.46  --   CREATININE 0.71  --   --  0.93  --   --     Estimated Creatinine Clearance: 62.2 mL/min (by C-G formula based on SCr of 0.93 mg/dL).   Assessment: Patient is a 78 y.o F with metastatic ovarian cancer on chemotherapy PTA (had chemo on 7/31) admitted to ED from the radiology department after she was found to have acute bilateral PE. She was started on heparin drip on admission. Pre MD's request, to transition to lovenox on 8/7.  Today, 02/04/2019:  CBC ok  NO bleeding documented  To transition from IV UFH to apixaban this AM 8/8   Goal of Therapy:  Anti-Xa level 0.6-1 units/ml 4hrs after LMWH dose given Monitor platelets by anticoagulation protocol: Yes   Plan:  1) D/C IV heparin now 2) Start apixaban 10mg  PO BID x 7 days then change dosing to 5mg  BID 3) Will educate patient prior to discharge   Adrian Saran, PharmD, BCPS 02/04/2019 9:03 AM

## 2019-02-04 NOTE — Progress Notes (Signed)
Pt ambulated 40 feet in hall. Pt stated she was short of breath and had pain in right hip. Walked back to room, O2 saturation 99% on room air. Pt resting in chair. Will continue to monitor.

## 2019-02-04 NOTE — Discharge Instructions (Signed)
Apixaban oral tablets What is this medicine? APIXABAN (a PIX a ban) is an anticoagulant (blood thinner). It is used to lower the chance of stroke in people with a medical condition called atrial fibrillation. It is also used to treat or prevent blood clots in the lungs or in the veins. This medicine may be used for other purposes; ask your health care provider or pharmacist if you have questions. COMMON BRAND NAME(S): Eliquis What should I tell my health care provider before I take this medicine? They need to know if you have any of these conditions:  antiphospholipid antibody syndrome  bleeding disorders  bleeding in the brain  blood in your stools (black or tarry stools) or if you have blood in your vomit  history of blood clots  history of stomach bleeding  kidney disease  liver disease  mechanical heart valve  an unusual or allergic reaction to apixaban, other medicines, foods, dyes, or preservatives  pregnant or trying to get pregnant  breast-feeding How should I use this medicine? Take this medicine by mouth with a glass of water. Follow the directions on the prescription label. You can take it with or without food. If it upsets your stomach, take it with food. Take your medicine at regular intervals. Do not take it more often than directed. Do not stop taking except on your doctor's advice. Stopping this medicine may increase your risk of a blood clot. Be sure to refill your prescription before you run out of medicine. Talk to your pediatrician regarding the use of this medicine in children. Special care may be needed. Overdosage: If you think you have taken too much of this medicine contact a poison control center or emergency room at once. NOTE: This medicine is only for you. Do not share this medicine with others. What if I miss a dose? If you miss a dose, take it as soon as you can. If it is almost time for your next dose, take only that dose. Do not take double or  extra doses. What may interact with this medicine? This medicine may interact with the following:  aspirin and aspirin-like medicines  certain medicines for fungal infections like ketoconazole and itraconazole  certain medicines for seizures like carbamazepine and phenytoin  certain medicines that treat or prevent blood clots like warfarin, enoxaparin, and dalteparin  clarithromycin  NSAIDs, medicines for pain and inflammation, like ibuprofen or naproxen  rifampin  ritonavir  St. John's wort This list may not describe all possible interactions. Give your health care provider a list of all the medicines, herbs, non-prescription drugs, or dietary supplements you use. Also tell them if you smoke, drink alcohol, or use illegal drugs. Some items may interact with your medicine. What should I watch for while using this medicine? Visit your healthcare professional for regular checks on your progress. You may need blood work done while you are taking this medicine. Your condition will be monitored carefully while you are receiving this medicine. It is important not to miss any appointments. Avoid sports and activities that might cause injury while you are using this medicine. Severe falls or injuries can cause unseen bleeding. Be careful when using sharp tools or knives. Consider using an electric razor. Take special care brushing or flossing your teeth. Report any injuries, bruising, or red spots on the skin to your healthcare professional. If you are going to need surgery or other procedure, tell your healthcare professional that you are taking this medicine. Wear a medical ID bracelet   or chain. Carry a card that describes your disease and details of your medicine and dosage times. What side effects may I notice from receiving this medicine? Side effects that you should report to your doctor or health care professional as soon as possible:  allergic reactions like skin rash, itching or hives,  swelling of the face, lips, or tongue  signs and symptoms of bleeding such as bloody or black, tarry stools; red or dark-brown urine; spitting up blood or brown material that looks like coffee grounds; red spots on the skin; unusual bruising or bleeding from the eye, gums, or nose  signs and symptoms of a blood clot such as chest pain; shortness of breath; pain, swelling, or warmth in the leg  signs and symptoms of a stroke such as changes in vision; confusion; trouble speaking or understanding; severe headaches; sudden numbness or weakness of the face, arm or leg; trouble walking; dizziness; loss of coordination This list may not describe all possible side effects. Call your doctor for medical advice about side effects. You may report side effects to FDA at 1-800-FDA-1088. Where should I keep my medicine? Keep out of the reach of children. Store at room temperature between 20 and 25 degrees C (68 and 77 degrees F). Throw away any unused medicine after the expiration date. NOTE: This sheet is a summary. It may not cover all possible information. If you have questions about this medicine, talk to your doctor, pharmacist, or health care provider.  2020 Elsevier/Gold Standard (2018-02-23 17:39:34)  

## 2019-02-06 ENCOUNTER — Ambulatory Visit: Payer: Medicare Other | Admitting: Gynecologic Oncology

## 2019-02-06 ENCOUNTER — Telehealth: Payer: Self-pay | Admitting: Hematology and Oncology

## 2019-02-06 NOTE — Telephone Encounter (Signed)
Scheduled appt per 8/10 sch message - pt aware of appt date and time   

## 2019-02-07 ENCOUNTER — Other Ambulatory Visit: Payer: Self-pay

## 2019-02-07 NOTE — Patient Outreach (Addendum)
[place] [facility]) Care Management  02/07/2019  Paula Peterson [facility] Oct 24, 1940 [id number]  EMMI:  General discharge  Referral date: 02/07/19 Referral reason: Scheduled follow up Insurance:  [place] health care Day # 1  Telephone call to patient regarding EMMI general discharge red alert. HIPAA verified with patient. RNCM introduced herself and explained reason for call.  Patient states she has not been able to schedule and appointment with her primary MD .  She states she attempted to contact her primary MD office on yesterday and today and was told she had to speak with the scheduler.  Patient states she left a message for the scheduler but has not heard back yet.  RNCM offered to call patients primary MD office to assist with scheduling follow up appointment. Patient expressed her agreement and appreciation.  Patient states she is scheduled for a follow up with Dr. Alvy Bimler her oncologist on Thursday  02/09/19. Patient states she found out she had ovarian cancer in March 2020.    Patient states she has transportation to her appointments.  Patient states she has good family support.  Patient reports having her medications and understanding how to take them.  Patient states she has chronic arthritis pain that causes her back to hurt. She states she has a tear in her right hip. Patient states she takes tylenol for pain and has stronger pain medication when needed.  RNCM called patients primary MD office and spoke with [facility].  Kaitlyn states appointment scheduling has to be done with Lamonte Sakai who is currently at lunch.  Kaitlyn transferred call and message was left with Lamonte Sakai to return call to this RNCM to schedule post hospital discharge follow up appointment.   PLAN: RNCM will attempt 2nd telephone call to patients primary MD if no return call received.  RNCM will follow up with patient once post hospital discharge appointment obtained.   Quinn Plowman RN,BSN,CCM [facility] Telephonic   [phone]

## 2019-02-07 NOTE — Patient Outreach (Signed)
[place] [facility]) Care Management  02/07/2019  Paula Peterson [facility] 1940-07-05 [id number]  EMMI:  General discharge  Referral date: 02/07/19 Referral reason: Scheduled follow up Insurance:  [place] health care Day # 1  Telephone call to Dr. Quillian Quince office.  Contact made with Jeani Hawking who scheduled patient post hospital discharge follow up appointment with Dr. Quillian Quince for February 09, 2019 at 1:30pm.  RNCM informed Jeani Hawking she will contact patient to notify her of scheduled appointment.  RNCM contacted patient. HIPAA verified.  Informed patient of post hospital discharge appointment with her primary MD for February 09, 2019 at 1:30pm.  Patient states this appointment is conflicting with and appointment she has scheduled that day with her oncologist.  [facility] informed patient she will follow up with patients primary MD office and rescheduled appointment.  Patient states she would like to know who she would talk with about making payment arrangements for her hospital stay.  RNCM provided patient contact phone number for [facility] patient to ask for the billing department to make payment arrangements. Patient verbalized her appreciation for [facility] assistance.   RNCM offered to follow up with patients primary MD office and patient to secure post hospital discharge appointment. Patient verbally agreed.  RNCM advised patient to notify MD of any changes in condition prior to scheduled appointment. RNCM provided contact name and number: [phone] or main office number [phone] and 24 hour nurse advise line [phone] by mail.  RNCM verified patient aware of 911 services for urgent/ emergent needs.  PLAN: RNCM will follow up with patients primary MD office and patient within 2 business days.  RNCM will mail patient [facility] care management brochure/ magnet.  Quinn Plowman RN,BSN,CCM [facility] Telephonic  [phone]

## 2019-02-08 ENCOUNTER — Other Ambulatory Visit: Payer: Self-pay

## 2019-02-08 NOTE — Patient Outreach (Signed)
[place] [facility]) Care Management  02/08/2019  Denika Krone [facility] Jul 16, 1940 [id number]   EMMI:General discharge Referral date:02/07/19 Referral reason:Scheduled follow up Insurance: [place] health care Day #1  Telephone call to patients primary MD office.  Voice mail message left for Jeani Hawking requesting to reschedule patients post hospital discharge appointment.    Plan; RNCM will await return call from primary MD office.  If no return call will attempt telephone outreach again.   Quinn Plowman RN,BSN,CCM [facility] Telephonic  [phone]

## 2019-02-09 ENCOUNTER — Inpatient Hospital Stay: Payer: Medicare Other | Attending: Gynecologic Oncology | Admitting: Hematology and Oncology

## 2019-02-09 ENCOUNTER — Ambulatory Visit: Payer: Self-pay

## 2019-02-09 ENCOUNTER — Other Ambulatory Visit: Payer: Self-pay

## 2019-02-09 ENCOUNTER — Encounter: Payer: Self-pay | Admitting: Hematology and Oncology

## 2019-02-09 DIAGNOSIS — R531 Weakness: Secondary | ICD-10-CM | POA: Diagnosis not present

## 2019-02-09 DIAGNOSIS — C786 Secondary malignant neoplasm of retroperitoneum and peritoneum: Secondary | ICD-10-CM | POA: Diagnosis not present

## 2019-02-09 DIAGNOSIS — R42 Dizziness and giddiness: Secondary | ICD-10-CM | POA: Insufficient documentation

## 2019-02-09 DIAGNOSIS — Z5111 Encounter for antineoplastic chemotherapy: Secondary | ICD-10-CM | POA: Diagnosis not present

## 2019-02-09 DIAGNOSIS — C569 Malignant neoplasm of unspecified ovary: Secondary | ICD-10-CM | POA: Diagnosis present

## 2019-02-09 DIAGNOSIS — R1013 Epigastric pain: Secondary | ICD-10-CM

## 2019-02-09 DIAGNOSIS — I10 Essential (primary) hypertension: Secondary | ICD-10-CM | POA: Insufficient documentation

## 2019-02-09 DIAGNOSIS — I2699 Other pulmonary embolism without acute cor pulmonale: Secondary | ICD-10-CM

## 2019-02-09 DIAGNOSIS — Z79899 Other long term (current) drug therapy: Secondary | ICD-10-CM | POA: Diagnosis not present

## 2019-02-09 DIAGNOSIS — R748 Abnormal levels of other serum enzymes: Secondary | ICD-10-CM | POA: Insufficient documentation

## 2019-02-09 DIAGNOSIS — K219 Gastro-esophageal reflux disease without esophagitis: Secondary | ICD-10-CM | POA: Diagnosis not present

## 2019-02-09 DIAGNOSIS — Z7901 Long term (current) use of anticoagulants: Secondary | ICD-10-CM | POA: Diagnosis not present

## 2019-02-09 MED ORDER — DEXAMETHASONE 4 MG PO TABS: ORAL_TABLET | ORAL | 9 refills | Status: DC

## 2019-02-09 MED FILL — DEXAMETHASONE 4 MG TABLET: 4 | 84 days supply | Qty: 16 | Fill #0

## 2019-02-09 NOTE — Assessment & Plan Note (Signed)
She has intermittent periumbilical epigastric pain that comes and goes The cause is unknown Her examination is benign I recommend over-the-counter analgesics I reviewed CT imaging with the patient which did not show any new disease in that region

## 2019-02-09 NOTE — Progress Notes (Signed)
[place] OFFICE PROGRESS NOTE  Patient Care Team: Caryl Bis, MD as PCP - General (Family Medicine) Dannielle Karvonen, RN as [place]  ASSESSMENT & PLAN:  Ovarian cancer [facility]) I have reviewed CT imaging and blood work with the patient The patient had good results and positive response to chemotherapy However, due to significant clot burden, she is not a good candidate for interval debulking surgery right now My plan would be to proceed with 3 more cycles of chemotherapy before repeating imaging study The plan of care is discussed with GYN surgeon as well and she agreed  PE (pulmonary thromboembolism) ([place]) She tolerated anticoagulation therapy very well She will continue for at least 6 months I recommend minimum 3 months of treatment before bridging therapy prior to surgical intervention The patient is educated to watch out for signs and symptoms of bleeding  Epigastric pain She has intermittent periumbilical epigastric pain that comes and goes The cause is unknown Her examination is benign I recommend over-the-counter analgesics I reviewed CT imaging with the patient which did not show any new disease in that region   No orders of the defined types were placed in this encounter.   INTERVAL HISTORY: Please see below for problem oriented charting. The patient is seen today as part of his hospital follow-up She tolerated anticoagulation therapy well Since discharge, she started to have periumbilical epigastric discomfort She did not rate her pain is severe It has improved somewhat yesterday She took some Tylenol but it was not helpful She does have increased belching The pain is not related to food, activity or bowel habits Her bowel habits are regular with laxatives She denies bleeding complication from anticoagulation therapy.  No shortness of breath or chest pain  SUMMARY OF ONCOLOGIC HISTORY: Oncology History   Ovarian cancer ([place])  10/24/2018 Initial Diagnosis   Her symptoms began in April/May, 2020.  She has bloating and early satiety. Shortness of breath with walking. She denied bleeding. She reported constipation with pain with defecation and narrowed stools   11/29/2018 Imaging   1. 13 cm complex cystic lesion in the central pelvis, highly suspicious for ovarian cystadenocarcinoma. 2. Diffuse peritoneal carcinomatosis with mild ascites. 3. Mild lymphadenopathy in porta hepatis and right cardiophrenic angle, suspicious for metastatic disease. 4. Moderate right and tiny left pleural effusions   12/05/2018 Tumor Marker   Patient's tumor was tested for the following markers: CA-125 Results of the tumor marker test revealed 1015.   12/06/2018 Cancer Staging   Staging form: Ovary, Fallopian Tube, and Primary Peritoneal Carcinoma, AJCC 8th Edition - Clinical: FIGO Stage IVA, calculated as Stage IV (cT3c, cN1, cM1) - Signed by [name], MD on 12/06/2018   12/09/2018 Pathology Results   PLEURAL FLUID, RIGHT (SPECIMEN 1 OF 1 COLLECTED 12/09/18): - MALIGNANT CELLS CONSISTENT WITH METASTATIC ADENOCARCINOMA - SEE COMMENT Comment The neoplastic cells are positive for cytokeratin 7 and Pax-8 but negative for cytokeratin 20, TTF-1, CDX-2 and Gata-3. Overall, the phenotype is consistent with the clinical impression of gynecologic primary.    12/15/2018 Procedure   Placement of single lumen port a cath via right internal jugular vein. The catheter tip lies at the cavo-atrial junction. A power injectable port a cath was placed and is ready for immediate use   12/16/2018 -  Chemotherapy   The patient had carboplatin and taxol for chemotherapy treatment.     01/06/2019 Tumor Marker   Patient's tumor was tested for the following markers:  CA-125 Results of the tumor marker test revealed 948   02/02/2019 Imaging   1. Massive pulmonary embolism, as discussed above. Given the mildly elevated RV to LV ratio of 0.95,  this is associated with increased risk of morbidity and mortality. 2. Today's study demonstrates a mixed response to therapy. Specifically, while there has been regression of the bulky intraperitoneal metastatic disease and regression of previously noted pleural effusions, the large cystic mass in the central pelvis has increased in size compared to the prior study. 3. New right mild hydroureteronephrosis related to extrinsic compression on the distal third of the right ureter by the patient's large pelvic mass. 4. Scattered small pulmonary nodules (predominantly pleural based) appear stable compared to prior examinations. These are nonspecific but warrant continued attention on follow-up studies. 5. Aortic atherosclerosis, in addition to three-vessel coronary artery disease. Assessment for potential risk factor modification, dietary therapy or pharmacologic therapy may be warranted, if clinically indicated.   02/02/2019 - 02/04/2019 Hospital Admission   She was admitted the hospital due to DVT and PE   02/03/2019 Imaging   Bilateral venous Doppler US Right: Findings consistent with acute deep vein thrombosis involving the right femoral vein, right popliteal vein, right peroneal veins, right soleal veins, and right gastrocnemius veins. No cystic structure found in the popliteal fossa. Left: There is no evidence of deep vein thrombosis in the lower extremity. No cystic structure found in the popliteal fossa     REVIEW OF SYSTEMS:   Constitutional: Denies fevers, chills or abnormal weight loss Eyes: Denies blurriness of vision Ears, nose, mouth, throat, and face: Denies mucositis or sore throat Respiratory: Denies cough, dyspnea or wheezes Cardiovascular: Denies palpitation, chest discomfort or lower extremity swelling Gastrointestinal:  Denies nausea, heartburn or change in bowel habits Skin: Denies abnormal skin rashes Lymphatics: Denies new lymphadenopathy or easy bruising Neurological:Denies  numbness, tingling or new weaknesses Behavioral/Psych: Mood is stable, no new changes  All other systems were reviewed with the patient and are negative.  I have reviewed the past medical history, past surgical history, social history and family history with the patient and they are unchanged from previous note.  ALLERGIES:  has No Known Allergies.  MEDICATIONS:  Current Outpatient Medications  Medication Sig Dispense Refill  . amLODipine (NORVASC) 2.5 MG tablet Take 2.5 mg by mouth daily.    Marland Kitchen apixaban (ELIQUIS) 5 MG TABS tablet Take 2 tablets (10 mg total) by mouth 2 (two) times daily for 7 days, THEN 1 tablet (5 mg total) 2 (two) times daily for 23 days. 74 tablet 0  . dexamethasone (DECADRON) 4 MG tablet Take 2 tabs at the night before and 2 tab the morning of chemotherapy, every 3 weeks, by mouth with food 24 tablet 9  . levothyroxine (SYNTHROID) 25 MCG tablet Take 1 tablet (25 mcg total) by mouth daily before breakfast. 30 tablet 11  . lidocaine-prilocaine (EMLA) cream Apply to affected area once (Patient taking differently: Apply 1 application topically as needed (port access). ) 30 g 3  . losartan (COZAAR) 50 MG tablet Take 50 mg by mouth daily.    Marland Kitchen omeprazole (PRILOSEC) 20 MG capsule Take 20 mg by mouth daily.    . ondansetron (ZOFRAN) 8 MG tablet Take 1 tablet (8 mg total) by mouth every 8 (eight) hours as needed for refractory nausea / vomiting. 30 tablet 1  . polyethylene glycol (MIRALAX / GLYCOLAX) 17 g packet Take 17 g by mouth daily as needed for mild constipation.     Marland Kitchen  prochlorperazine (COMPAZINE) 10 MG tablet Take 1 tablet (10 mg total) by mouth every 6 (six) hours as needed (Nausea or vomiting). 30 tablet 1  . traMADol (ULTRAM) 50 MG tablet Take 1 tablet (50 mg total) by mouth every 6 (six) hours as needed. (Patient taking differently: Take 50 mg by mouth every 6 (six) hours as needed for moderate pain. ) 30 tablet 0   No current facility-administered medications for this  visit.     PHYSICAL EXAMINATION: ECOG PERFORMANCE STATUS: 1 - Symptomatic but completely ambulatory  Vitals:   02/09/19 1100  BP: 128/80  Pulse: (!) 112  Resp: 18  Temp: 98.2 F (36.8 C)  SpO2: 97%   Filed Weights   02/09/19 1100  Weight: 208 lb 12.8 oz (94.7 kg)    GENERAL:alert, no distress and comfortable SKIN: skin color, texture, turgor are normal, no rashes or significant lesions EYES: normal, Conjunctiva are pink and non-injected, sclera clear OROPHARYNX:no exudate, no erythema and lips, buccal mucosa, and tongue normal  NECK: supple, thyroid normal size, non-tender, without nodularity LYMPH:  no palpable lymphadenopathy in the cervical, axillary or inguinal LUNGS: clear to auscultation and percussion with normal breathing effort HEART: regular rate & rhythm and no murmurs and no lower extremity edema ABDOMEN:abdomen soft, with mild epigastric discomfort but no rebound or guarding Musculoskeletal:no cyanosis of digits and no clubbing  NEURO: alert & oriented x 3 with fluent speech, no focal motor/sensory deficits  LABORATORY DATA:  I have reviewed the data as listed    Component Value Date/Time   NA 137 02/04/2019 0849   K 3.8 02/04/2019 0849   CL 103 02/04/2019 0849   CO2 23 02/04/2019 0849   GLUCOSE 173 (H) 02/04/2019 0849   BUN 13 02/04/2019 0849   CREATININE 0.88 02/04/2019 0849   CREATININE 0.99 01/27/2019 0749   CALCIUM 9.7 02/04/2019 0849   PROT 7.8 01/27/2019 0749   ALBUMIN 3.5 01/27/2019 0749   AST 21 01/27/2019 0749   ALT 32 01/27/2019 0749   ALKPHOS 121 01/27/2019 0749   BILITOT 0.2 (L) 01/27/2019 0749   GFRNONAA >60 02/04/2019 0849   GFRNONAA 56 (L) 01/27/2019 0749   GFRAA >60 02/04/2019 0849   GFRAA >60 01/27/2019 0749    No results found for: SPEP, UPEP  Lab Results  Component Value Date   WBC 2.9 (L) 02/04/2019   NEUTROABS 2.0 02/02/2019   HGB 10.9 (L) 02/04/2019   HCT 33.2 (L) 02/04/2019   MCV 93.5 02/04/2019   PLT 188  02/04/2019      Chemistry      Component Value Date/Time   NA 137 02/04/2019 0849   K 3.8 02/04/2019 0849   CL 103 02/04/2019 0849   CO2 23 02/04/2019 0849   BUN 13 02/04/2019 0849   CREATININE 0.88 02/04/2019 0849   CREATININE 0.99 01/27/2019 0749      Component Value Date/Time   CALCIUM 9.7 02/04/2019 0849   ALKPHOS 121 01/27/2019 0749   AST 21 01/27/2019 0749   ALT 32 01/27/2019 0749   BILITOT 0.2 (L) 01/27/2019 0749       RADIOGRAPHIC STUDIES: I have reviewed imaging studies with the patient I have personally reviewed the radiological images as listed and agreed with the findings in the report. 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 Follow-up evaluation. EXAM: CT CHEST, ABDOMEN, AND PELVIS WITH CONTRAST TECHNIQUE: Multidetector  CT imaging of the chest, abdomen and pelvis was performed following the standard protocol during bolus administration of intravenous contrast. CONTRAST:  16mL OMNIPAQUE IOHEXOL 300 MG/ML  SOLN COMPARISON:  Chest CT 12/12/2018. FINDINGS: CT CHEST FINDINGS Cardiovascular: Heart size is normal. There is no significant pericardial fluid, thickening or pericardial calcification. There is aortic atherosclerosis, as well as atherosclerosis of the great vessels of the mediastinum and the coronary arteries, including calcified atherosclerotic plaque in the left anterior descending, left circumflex and right coronary arteries. Filling defects are noted in the pulmonary arterial tree bilaterally, indicative of widespread pulmonary embolus. This includes saddle embolus, main, lobar, segmental and subsegmental sized emboli bilaterally. The largest burden of clot appears in the right lower lobe pulmonary arterial tree where there is a combination of occlusive and nonocclusive thrombus. Right internal jugular  single-lumen porta cath with tip terminating in the superior aspect of the right atrium. Right ventricle measures approximately 4.2 cm in diameter. Left ventricle measures approximately 4.4 cm in diameter. RV to LV ratio of 0.95. Mediastinum/Nodes: No pathologically enlarged mediastinal or hilar lymph nodes. Esophagus is unremarkable in appearance. No axillary lymphadenopathy. Lungs/Pleura: Multiple small predominantly pleural-based nodules are again noted throughout the lungs bilaterally, similar in size and number to the prior examination, nonspecific, but favored to be benign. No other larger more suspicious appearing pulmonary nodules or masses are noted. No acute consolidative airspace disease. Resolution of previously noted left pleural effusion. Small right pleural effusion has decreased in size compared to the prior study. Musculoskeletal: There are no aggressive appearing lytic or blastic lesions noted in the visualized portions of the skeleton. CT ABDOMEN PELVIS FINDINGS Hepatobiliary: No suspicious cystic or solid hepatic lesions. No intra or extrahepatic biliary ductal dilatation. Gallbladder is normal in appearance. Pancreas: No pancreatic mass. No pancreatic ductal dilatation. No pancreatic or peripancreatic fluid collections or inflammatory changes are noted. Spleen: Unremarkable. Adrenals/Urinary Tract: Multiple low-attenuation lesions in the left renal hilum, most compatible with small parapelvic cysts. No suspicious right renal lesions. Mild right hydroureteronephrosis, new compared to the prior study, which appears related to external compression on the distal third of the right ureter by the patient's large pelvic mass (discussed below). No left hydroureteronephrosis. Urinary bladder is unremarkable in appearance. Bilateral adrenal glands are normal in appearance. Stomach/Bowel: Normal appearance of the stomach. No pathologic dilatation of small bowel or colon. Appendix is not confidently  identified may be surgically absent or atrophic. Vascular/Lymphatic: Aortic atherosclerosis, without evidence of aneurysm or dissection in the abdominal or pelvic vasculature. Filling defect in the right superficial femoral vein incompletely imaged (axial image 137 of series 2), compatible with deep venous thrombosis. No lymphadenopathy noted in the abdomen or pelvis. Reproductive: Status post hysterectomy. Pessary in the vagina. Large cystic mass in the central aspect of the pelvis which has increased in size compared to the prior examination, largest of which measures 15.7 x 11.0 x 12.4 cm (axial image 108 of series 2 and coronal image 70 of series 5), previously 13.4 x 9.8 cm on 11/29/2018. Other: There continues to be scattered areas of abnormal soft tissue throughout the peritoneal cavity, indicative of widespread intraperitoneal metastatic disease, bulkiest portion of which is visualized on axial image 94 of series 2, overall decreased in size compared to the prior study. Trace ascites in the low anatomic pelvis. No pneumoperitoneum. Musculoskeletal: There are no aggressive appearing lytic or blastic lesions noted in the visualized portions of the skeleton. IMPRESSION: 1. Massive pulmonary embolism, as discussed above. Given the mildly  elevated RV to LV ratio of 0.95, this is associated with increased risk of morbidity and mortality. 2. Today's study demonstrates a mixed response to therapy. Specifically, while there has been regression of the bulky intraperitoneal metastatic disease and regression of previously noted pleural effusions, the large cystic mass in the central pelvis has increased in size compared to the prior study. 3. New right mild hydroureteronephrosis related to extrinsic compression on the distal third of the right ureter by the patient's large pelvic mass. 4. Scattered small pulmonary nodules (predominantly pleural based) appear stable compared to prior examinations. These are nonspecific  but warrant continued attention on follow-up studies. 5. Aortic atherosclerosis, in addition to three-vessel coronary artery disease. Assessment for potential risk factor modification, dietary therapy or pharmacologic therapy may be warranted, if clinically indicated. 6. Additional incidental findings, as above. Critical Value/emergent results were called by telephone at the time of interpretation on 02/02/2019 at 10:36 a.m. to Dr. [name] , who verbally acknowledged these results. Electronically Signed   By: Vinnie Langton M.D.   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 Follow-up evaluation. EXAM: CT CHEST, ABDOMEN, AND PELVIS WITH CONTRAST TECHNIQUE: Multidetector CT imaging of the chest, abdomen and pelvis was performed following the standard protocol during bolus administration of intravenous contrast. CONTRAST:  132mL OMNIPAQUE IOHEXOL 300 MG/ML  SOLN COMPARISON:  Chest CT 12/12/2018. FINDINGS: CT CHEST FINDINGS Cardiovascular: Heart size is normal. There is no significant pericardial fluid, thickening or pericardial calcification. There is aortic atherosclerosis, as well as atherosclerosis of the great vessels of the mediastinum and the coronary arteries, including calcified atherosclerotic plaque in the left anterior descending, left circumflex and right coronary arteries. Filling defects are noted in the pulmonary arterial tree bilaterally, indicative of widespread pulmonary embolus. This includes saddle embolus, main, lobar, segmental and subsegmental sized emboli bilaterally. The largest burden of clot appears in the right lower lobe pulmonary arterial tree where there is a combination of occlusive and nonocclusive thrombus. Right internal jugular single-lumen porta cath with tip terminating in the superior  aspect of the right atrium. Right ventricle measures approximately 4.2 cm in diameter. Left ventricle measures approximately 4.4 cm in diameter. RV to LV ratio of 0.95. Mediastinum/Nodes: No pathologically enlarged mediastinal or hilar lymph nodes. Esophagus is unremarkable in appearance. No axillary lymphadenopathy. Lungs/Pleura: Multiple small predominantly pleural-based nodules are again noted throughout the lungs bilaterally, similar in size and number to the prior examination, nonspecific, but favored to be benign. No other larger more suspicious appearing pulmonary nodules or masses are noted. No acute consolidative airspace disease. Resolution of previously noted left pleural effusion. Small right pleural effusion has decreased in size compared to the prior study. Musculoskeletal: There are no aggressive appearing lytic or blastic lesions noted in the visualized portions of the skeleton. CT ABDOMEN PELVIS FINDINGS Hepatobiliary: No suspicious cystic or solid hepatic lesions. No intra or extrahepatic biliary ductal dilatation. Gallbladder is normal in appearance. Pancreas: No pancreatic mass. No pancreatic ductal dilatation. No pancreatic or peripancreatic fluid collections or inflammatory changes are noted. Spleen: Unremarkable. Adrenals/Urinary Tract: Multiple low-attenuation lesions in the left renal hilum, most compatible with small parapelvic cysts. No suspicious right renal lesions. Mild right hydroureteronephrosis, new compared to the prior study, which appears related to external compression on the distal third of the right ureter by the patient's large pelvic mass (  discussed below). No left hydroureteronephrosis. Urinary bladder is unremarkable in appearance. Bilateral adrenal glands are normal in appearance. Stomach/Bowel: Normal appearance of the stomach. No pathologic dilatation of small bowel or colon. Appendix is not confidently identified may be surgically absent or atrophic. Vascular/Lymphatic:  Aortic atherosclerosis, without evidence of aneurysm or dissection in the abdominal or pelvic vasculature. Filling defect in the right superficial femoral vein incompletely imaged (axial image 137 of series 2), compatible with deep venous thrombosis. No lymphadenopathy noted in the abdomen or pelvis. Reproductive: Status post hysterectomy. Pessary in the vagina. Large cystic mass in the central aspect of the pelvis which has increased in size compared to the prior examination, largest of which measures 15.7 x 11.0 x 12.4 cm (axial image 108 of series 2 and coronal image 70 of series 5), previously 13.4 x 9.8 cm on 11/29/2018. Other: There continues to be scattered areas of abnormal soft tissue throughout the peritoneal cavity, indicative of widespread intraperitoneal metastatic disease, bulkiest portion of which is visualized on axial image 94 of series 2, overall decreased in size compared to the prior study. Trace ascites in the low anatomic pelvis. No pneumoperitoneum. Musculoskeletal: There are no aggressive appearing lytic or blastic lesions noted in the visualized portions of the skeleton. IMPRESSION: 1. Massive pulmonary embolism, as discussed above. Given the mildly elevated RV to LV ratio of 0.95, this is associated with increased risk of morbidity and mortality. 2. Today's study demonstrates a mixed response to therapy. Specifically, while there has been regression of the bulky intraperitoneal metastatic disease and regression of previously noted pleural effusions, the large cystic mass in the central pelvis has increased in size compared to the prior study. 3. New right mild hydroureteronephrosis related to extrinsic compression on the distal third of the right ureter by the patient's large pelvic mass. 4. Scattered small pulmonary nodules (predominantly pleural based) appear stable compared to prior examinations. These are nonspecific but warrant continued attention on follow-up studies. 5. Aortic  atherosclerosis, in addition to three-vessel coronary artery disease. Assessment for potential risk factor modification, dietary therapy or pharmacologic therapy may be warranted, if clinically indicated. 6. Additional incidental findings, as above. Critical Value/emergent results were called by telephone at the time of interpretation on 02/02/2019 at 10:36 a.m. to Dr. [name] , who verbally acknowledged these results. Electronically Signed   By: Vinnie Langton M.D.   On: 02/02/2019 11:33   Vas [place] Lower Extremity Venous (dvt)  Result Date: 02/05/2019  Lower Venous Study Indications: Pulmonary embolism.  Risk Factors: Cancer. Anticoagulation: Heparin. Comparison Study: No prior studies. Performing Technologist: Oliver Hum RVT  Examination Guidelines: A complete evaluation includes B-mode imaging, spectral Doppler, color Doppler, and power Doppler as needed of all accessible portions of each vessel. Bilateral testing is considered an integral part of a complete examination. Limited examinations for reoccurring indications may be performed as noted.  +---------+---------------+---------+-----------+----------+-------+ RIGHT    CompressibilityPhasicitySpontaneityPropertiesSummary +---------+---------------+---------+-----------+----------+-------+ CFV      Full           No       No                           +---------+---------------+---------+-----------+----------+-------+ SFJ      Full                                                 +---------+---------------+---------+-----------+----------+-------+  FV Prox  Partial        No       No                   Acute   +---------+---------------+---------+-----------+----------+-------+ FV Mid   None           No       No                   Acute   +---------+---------------+---------+-----------+----------+-------+ FV DistalPartial        No       No                   Acute    +---------+---------------+---------+-----------+----------+-------+ POP      Partial        No       No                   Acute   +---------+---------------+---------+-----------+----------+-------+ PTV      Full                                                 +---------+---------------+---------+-----------+----------+-------+ PERO     Partial                                      Acute   +---------+---------------+---------+-----------+----------+-------+ Soleal   Partial                                      Acute   +---------+---------------+---------+-----------+----------+-------+ Gastroc  Partial                                      Acute   +---------+---------------+---------+-----------+----------+-------+   +---------+---------------+---------+-----------+----------+-------+ LEFT     CompressibilityPhasicitySpontaneityPropertiesSummary +---------+---------------+---------+-----------+----------+-------+ CFV      Full           Yes      Yes                          +---------+---------------+---------+-----------+----------+-------+ SFJ      Full                                                 +---------+---------------+---------+-----------+----------+-------+ FV Prox  Full                                                 +---------+---------------+---------+-----------+----------+-------+ FV Mid   Full                                                 +---------+---------------+---------+-----------+----------+-------+ FV DistalFull                                                 +---------+---------------+---------+-----------+----------+-------+  PFV      Full                                                 +---------+---------------+---------+-----------+----------+-------+ POP      Full           Yes      Yes                          +---------+---------------+---------+-----------+----------+-------+ PTV      Full                                                  +---------+---------------+---------+-----------+----------+-------+ PERO     Full                                                 +---------+---------------+---------+-----------+----------+-------+     Summary: Right: Findings consistent with acute deep vein thrombosis involving the right femoral vein, right popliteal vein, right peroneal veins, right soleal veins, and right gastrocnemius veins. No cystic structure found in the popliteal fossa. Left: There is no evidence of deep vein thrombosis in the lower extremity. No cystic structure found in the popliteal fossa.  *See table(s) above for measurements and observations. Electronically signed by Harold Barban MD on 02/05/2019 at 12:34:10 PM.    Final     All questions were answered. The patient knows to call the clinic with any problems, questions or concerns. No barriers to learning was detected.  I spent 25 minutes counseling the patient face to face. The total time spent in the appointment was 30 minutes and more than 50% was on counseling and review of test results  [name], MD 02/09/2019 12:27 PM

## 2019-02-09 NOTE — Assessment & Plan Note (Signed)
I have reviewed CT imaging and blood work with the patient The patient had good results and positive response to chemotherapy However, due to significant clot burden, she is not a good candidate for interval debulking surgery right now My plan would be to proceed with 3 more cycles of chemotherapy before repeating imaging study The plan of care is discussed with GYN surgeon as well and she agreed

## 2019-02-09 NOTE — Assessment & Plan Note (Signed)
She tolerated anticoagulation therapy very well She will continue for at least 6 months I recommend minimum 3 months of treatment before bridging therapy prior to surgical intervention The patient is educated to watch out for signs and symptoms of bleeding

## 2019-02-10 ENCOUNTER — Other Ambulatory Visit: Payer: Self-pay

## 2019-02-10 ENCOUNTER — Telehealth: Payer: Self-pay | Admitting: Hematology and Oncology

## 2019-02-10 NOTE — Telephone Encounter (Signed)
I talk with patient regarding schedule  

## 2019-02-10 NOTE — Patient Outreach (Signed)
[facility]) Care Management  02/10/2019  Taraneh Metheney [facility] 1940-10-02 [id number]   EMMI:General discharge Referral date:02/07/19 Referral reason:Scheduled follow up Insurance: [place] health care Day #1  Telephone call to patients primary MD office to speak with Jeani Hawking.  Jeani Hawking states patient was seen by Dr. Arcola Jansky her primary MD on yesterday, 02/09/19.  Telephone call to patient. HIPAA verified. Patient states she saw her oncologist and primary MD.  She states she felt both appointments went well.  Patient states the chemotherapy is working.  Patient reports she has a follow up with her primary MD on 02/28/19 and will receive her flu shot at that time. Patient states she feels a little better today, just tired of course.  Patient denies any further needs.  Patient verbalized her appreciation for [facility] assistance.  Patient confirms she has the 24 hour nurse advise line number.  RNCM informed patient the [facility] care management brochure was sent to her on 02/07/19. Patient verbalized understanding.   PLAN: RNCM will close case due to patient being assessed and having no further needs.   Quinn Plowman RN,BSN,CCM [facility] Telephonic  [phone]

## 2019-02-11 DIAGNOSIS — N390 Urinary tract infection, site not specified: Secondary | ICD-10-CM | POA: Diagnosis not present

## 2019-02-17 ENCOUNTER — Encounter: Payer: Self-pay | Admitting: Hematology and Oncology

## 2019-02-17 ENCOUNTER — Other Ambulatory Visit: Payer: Self-pay

## 2019-02-17 ENCOUNTER — Inpatient Hospital Stay (HOSPITAL_BASED_OUTPATIENT_CLINIC_OR_DEPARTMENT_OTHER): Payer: Medicare Other | Admitting: Hematology and Oncology

## 2019-02-17 ENCOUNTER — Inpatient Hospital Stay: Payer: Medicare Other

## 2019-02-17 DIAGNOSIS — C569 Malignant neoplasm of unspecified ovary: Secondary | ICD-10-CM

## 2019-02-17 DIAGNOSIS — R1013 Epigastric pain: Secondary | ICD-10-CM | POA: Diagnosis not present

## 2019-02-17 DIAGNOSIS — I2699 Other pulmonary embolism without acute cor pulmonale: Secondary | ICD-10-CM

## 2019-02-17 DIAGNOSIS — I10 Essential (primary) hypertension: Secondary | ICD-10-CM | POA: Diagnosis not present

## 2019-02-17 DIAGNOSIS — Z5111 Encounter for antineoplastic chemotherapy: Secondary | ICD-10-CM | POA: Diagnosis not present

## 2019-02-17 DIAGNOSIS — Z79899 Other long term (current) drug therapy: Secondary | ICD-10-CM | POA: Diagnosis not present

## 2019-02-17 DIAGNOSIS — R748 Abnormal levels of other serum enzymes: Secondary | ICD-10-CM

## 2019-02-17 DIAGNOSIS — C786 Secondary malignant neoplasm of retroperitoneum and peritoneum: Secondary | ICD-10-CM | POA: Diagnosis not present

## 2019-02-17 DIAGNOSIS — R42 Dizziness and giddiness: Secondary | ICD-10-CM | POA: Diagnosis not present

## 2019-02-17 DIAGNOSIS — R531 Weakness: Secondary | ICD-10-CM | POA: Diagnosis not present

## 2019-02-17 DIAGNOSIS — Z7901 Long term (current) use of anticoagulants: Secondary | ICD-10-CM | POA: Diagnosis not present

## 2019-02-17 DIAGNOSIS — K219 Gastro-esophageal reflux disease without esophagitis: Secondary | ICD-10-CM | POA: Diagnosis not present

## 2019-02-17 DIAGNOSIS — E039 Hypothyroidism, unspecified: Secondary | ICD-10-CM

## 2019-02-17 LAB — CMP (CANCER CENTER ONLY)
ALT: 56 U/L — ABNORMAL HIGH (ref 0–44)
AST: 32 U/L (ref 15–41)
Albumin: 3.5 g/dL (ref 3.5–5.0)
Alkaline Phosphatase: 141 U/L — ABNORMAL HIGH (ref 38–126)
Anion gap: 11 (ref 5–15)
BUN: 21 mg/dL (ref 8–23)
CO2: 24 mmol/L (ref 22–32)
Calcium: 10.4 mg/dL — ABNORMAL HIGH (ref 8.9–10.3)
Chloride: 103 mmol/L (ref 98–111)
Creatinine: 0.96 mg/dL (ref 0.44–1.00)
GFR, Est AFR Am: >60 mL/min (ref >60)
GFR, Estimated: 58 mL/min — ABNORMAL LOW (ref >60)
Glucose, Bld: 141 mg/dL — ABNORMAL HIGH (ref 70–99)
Potassium: 4.3 mmol/L (ref 3.5–5.1)
Sodium: 138 mmol/L (ref 135–145)
Total Bilirubin: 0.2 mg/dL — ABNORMAL LOW (ref 0.3–1.2)
Total Protein: 7.8 g/dL (ref 6.5–8.1)

## 2019-02-17 LAB — CBC WITH DIFFERENTIAL (CANCER CENTER ONLY)
Abs Immature Granulocytes: 0.03 10*3/uL (ref 0.00–0.07)
Basophils Absolute: 0 10*3/uL (ref 0.0–0.1)
Basophils Relative: 0 %
Eosinophils Absolute: 0 10*3/uL (ref 0.0–0.5)
Eosinophils Relative: 0 %
HCT: 33 % — ABNORMAL LOW (ref 36.0–46.0)
Hemoglobin: 11 g/dL — ABNORMAL LOW (ref 12.0–15.0)
Immature Granulocytes: 1 %
Lymphocytes Relative: 18 %
Lymphs Abs: 1 10*3/uL (ref 0.7–4.0)
MCH: 30.7 pg (ref 26.0–34.0)
MCHC: 33.3 g/dL (ref 30.0–36.0)
MCV: 92.2 fL (ref 80.0–100.0)
Monocytes Absolute: 0.4 10*3/uL (ref 0.1–1.0)
Monocytes Relative: 6 %
Neutro Abs: 4.2 10*3/uL (ref 1.7–7.7)
Neutrophils Relative %: 75 %
Platelet Count: 217 10*3/uL (ref 150–400)
RBC: 3.58 MIL/uL — ABNORMAL LOW (ref 3.87–5.11)
RDW: 15.7 % — ABNORMAL HIGH (ref 11.5–15.5)
WBC Count: 5.6 10*3/uL (ref 4.0–10.5)
nRBC: 0 % (ref 0.0–0.2)

## 2019-02-17 LAB — TSH: TSH: 3.549 u[IU]/mL (ref 0.308–3.960)

## 2019-02-17 MED ORDER — SODIUM CHLORIDE 0.9 % IV SOLN
Freq: Once | INTRAVENOUS | Status: AC
  Administered 2019-02-17: 11:00:00 via INTRAVENOUS
  Filled 2019-02-17: qty 5

## 2019-02-17 MED ORDER — PALONOSETRON HCL INJECTION 0.25 MG/5ML
0.2500 mg | Freq: Once | INTRAVENOUS | Status: AC
  Administered 2019-02-17: 11:00:00 0.25 mg via INTRAVENOUS

## 2019-02-17 MED ORDER — FAMOTIDINE IN NACL 20-0.9 MG/50ML-% IV SOLN
20.0000 mg | Freq: Once | INTRAVENOUS | Status: AC
  Administered 2019-02-17: 11:00:00 20 mg via INTRAVENOUS

## 2019-02-17 MED ORDER — SODIUM CHLORIDE 0.9 % IV SOLN
116.6667 mg/m2 | Freq: Once | INTRAVENOUS | Status: AC
  Administered 2019-02-17: 12:00:00 252 mg via INTRAVENOUS
  Filled 2019-02-17: qty 42

## 2019-02-17 MED ORDER — PALONOSETRON HCL INJECTION 0.25 MG/5ML
INTRAVENOUS | Status: AC
  Filled 2019-02-17: qty 5

## 2019-02-17 MED ORDER — SODIUM CHLORIDE 0.9 % IV SOLN
Freq: Once | INTRAVENOUS | Status: AC
  Administered 2019-02-17: 11:00:00 via INTRAVENOUS
  Filled 2019-02-17: qty 250

## 2019-02-17 MED ORDER — SODIUM CHLORIDE 0.9 % IV SOLN
596.4000 mg | Freq: Once | INTRAVENOUS | Status: AC
  Administered 2019-02-17: 15:00:00 600 mg via INTRAVENOUS
  Filled 2019-02-17: qty 60

## 2019-02-17 MED ORDER — DIPHENHYDRAMINE HCL 50 MG/ML IJ SOLN
INTRAMUSCULAR | Status: AC
  Filled 2019-02-17: qty 1

## 2019-02-17 MED ORDER — FAMOTIDINE IN NACL 20-0.9 MG/50ML-% IV SOLN
INTRAVENOUS | Status: AC
  Filled 2019-02-17: qty 50

## 2019-02-17 MED ORDER — DIPHENHYDRAMINE HCL 50 MG/ML IJ SOLN
50.0000 mg | Freq: Once | INTRAMUSCULAR | Status: AC
  Administered 2019-02-17: 11:00:00 50 mg via INTRAVENOUS

## 2019-02-17 MED ORDER — SODIUM CHLORIDE 0.9% FLUSH
10.0000 mL | Freq: Once | INTRAVENOUS | Status: AC
  Administered 2019-02-17: 10 mL
  Filled 2019-02-17: qty 10

## 2019-02-17 MED ORDER — SODIUM CHLORIDE 0.9% FLUSH
10.0000 mL | INTRAVENOUS | Status: DC | PRN
  Administered 2019-02-17: 10 mL
  Filled 2019-02-17: qty 10

## 2019-02-17 MED ORDER — HEPARIN SOD (PORK) LOCK FLUSH 100 UNIT/ML IV SOLN
500.0000 [IU] | Freq: Once | INTRAVENOUS | Status: AC | PRN
  Administered 2019-02-17: 16:00:00 500 [IU]
  Filled 2019-02-17: qty 5

## 2019-02-17 NOTE — Patient Instructions (Signed)
[facility] Discharge Instructions for Patients Receiving Chemotherapy  Today you received the following chemotherapy agents:  Taxol, Carboplatin  To help prevent nausea and vomiting after your treatment, we encourage you to take your nausea medication as prescribed.   If you develop nausea and vomiting that is not controlled by your nausea medication, call the clinic.   BELOW ARE SYMPTOMS THAT SHOULD BE REPORTED IMMEDIATELY:  *FEVER GREATER THAN 100.5 F  *CHILLS WITH OR WITHOUT FEVER  NAUSEA AND VOMITING THAT IS NOT CONTROLLED WITH YOUR NAUSEA MEDICATION  *UNUSUAL SHORTNESS OF BREATH  *UNUSUAL BRUISING OR BLEEDING  TENDERNESS IN MOUTH AND THROAT WITH OR WITHOUT PRESENCE OF ULCERS  *URINARY PROBLEMS  *BOWEL PROBLEMS  UNUSUAL RASH Items with * indicate a potential emergency and should be followed up as soon as possible.  Feel free to call the clinic should you have any questions or concerns. The clinic phone number is [phone].  Please show the CHEMO ALERT CARD at check-in to the Emergency Department and triage nurse.   

## 2019-02-17 NOTE — Assessment & Plan Note (Signed)
She tolerated anticoagulation therapy very well She will continue for at least 6 months I recommend minimum 3 months of treatment before bridging therapy prior to surgical intervention The patient is educated to watch out for signs and symptoms of bleeding 

## 2019-02-17 NOTE — Progress Notes (Signed)
[facility] OFFICE PROGRESS NOTE  Patient Care Team: Caryl Bis, MD as PCP - General (Family Medicine)  ASSESSMENT & PLAN:  Ovarian cancer [name]) She is still quite weak after recent diagnosis of PE but overall improving/stable We will continue treatment for total of 6 cycles before repeating CT imaging for assessment and possible surgery in the future  PE (pulmonary thromboembolism) ([name]) She tolerated anticoagulation therapy very well She will continue for at least 6 months I recommend minimum 3 months of treatment before bridging therapy prior to surgical intervention The patient is educated to watch out for signs and symptoms of bleeding  Elevated liver enzymes She has mildly elevated liver enzymes We will monitor closely   No orders of the defined types were placed in this encounter.   INTERVAL HISTORY: Please see below for problem oriented charting. She returns for further follow-up Since last time I saw her, she is gradually getting better Denies chest pain or shortness of breath Has occasional sensation of dizziness No recent bleeding  SUMMARY OF ONCOLOGIC HISTORY: Oncology History  Ovarian cancer ([name])  10/24/2018 Initial Diagnosis   Her symptoms began in April/May, 2020.  She has bloating and early satiety. Shortness of breath with walking. She denied bleeding. She reported constipation with pain with defecation and narrowed stools   11/29/2018 Imaging   1. 13 cm complex cystic lesion in the central pelvis, highly suspicious for ovarian cystadenocarcinoma. 2. Diffuse peritoneal carcinomatosis with mild ascites. 3. Mild lymphadenopathy in porta hepatis and right cardiophrenic angle, suspicious for metastatic disease. 4. Moderate right and tiny left pleural effusions   12/05/2018 Tumor Marker   Patient's tumor was tested for the following markers: CA-125 Results of the tumor marker test revealed 1015.   12/06/2018 Cancer Staging   Staging form:  Ovary, Fallopian Tube, and Primary Peritoneal Carcinoma, AJCC 8th Edition - Clinical: FIGO Stage IVA, calculated as Stage IV (cT3c, cN1, cM1) - Signed by [name], MD on 12/06/2018   12/09/2018 Pathology Results   PLEURAL FLUID, RIGHT (SPECIMEN 1 OF 1 COLLECTED 12/09/18): - MALIGNANT CELLS CONSISTENT WITH METASTATIC ADENOCARCINOMA - SEE COMMENT Comment The neoplastic cells are positive for cytokeratin 7 and Pax-8 but negative for cytokeratin 20, TTF-1, CDX-2 and Gata-3. Overall, the phenotype is consistent with the clinical impression of gynecologic primary.    12/15/2018 Procedure   Placement of single lumen port a cath via right internal jugular vein. The catheter tip lies at the cavo-atrial junction. A power injectable port a cath was placed and is ready for immediate use   12/16/2018 -  Chemotherapy   The patient had carboplatin and taxol for chemotherapy treatment.     01/06/2019 Tumor Marker   Patient's tumor was tested for the following markers: CA-125 Results of the tumor marker test revealed 948   02/02/2019 Imaging   1. Massive pulmonary embolism, as discussed above. Given the mildly elevated RV to LV ratio of 0.95, this is associated with increased risk of morbidity and mortality. 2. Today's study demonstrates a mixed response to therapy. Specifically, while there has been regression of the bulky intraperitoneal metastatic disease and regression of previously noted pleural effusions, the large cystic mass in the central pelvis has increased in size compared to the prior study. 3. New right mild hydroureteronephrosis related to extrinsic compression on the distal third of the right ureter by the patient's large pelvic mass. 4. Scattered small pulmonary nodules (predominantly pleural based) appear stable compared to prior examinations. These are nonspecific  but warrant continued attention on follow-up studies. 5. Aortic atherosclerosis, in addition to three-vessel coronary artery  disease. Assessment for potential risk factor modification, dietary therapy or pharmacologic therapy may be warranted, if clinically indicated.   02/02/2019 - 02/04/2019 Hospital Admission   She was admitted the hospital due to DVT and PE   02/03/2019 Imaging   Bilateral venous Doppler US Right: Findings consistent with acute deep vein thrombosis involving the right femoral vein, right popliteal vein, right peroneal veins, right soleal veins, and right gastrocnemius veins. No cystic structure found in the popliteal fossa. Left: There is no evidence of deep vein thrombosis in the lower extremity. No cystic structure found in the popliteal fossa     REVIEW OF SYSTEMS:   Constitutional: Denies fevers, chills or abnormal weight loss Eyes: Denies blurriness of vision Ears, nose, mouth, throat, and face: Denies mucositis or sore throat Respiratory: Denies cough, dyspnea or wheezes Cardiovascular: Denies palpitation, chest discomfort or lower extremity swelling Gastrointestinal:  Denies nausea, heartburn or change in bowel habits Skin: Denies abnormal skin rashes Lymphatics: Denies new lymphadenopathy or easy bruising Neurological:Denies numbness, tingling or new weaknesses Behavioral/Psych: Mood is stable, no new changes  All other systems were reviewed with the patient and are negative.  I have reviewed the past medical history, past surgical history, social history and family history with the patient and they are unchanged from previous note.  ALLERGIES:  has No Known Allergies.  MEDICATIONS:  Current Outpatient Medications  Medication Sig Dispense Refill  . amLODipine (NORVASC) 2.5 MG tablet Take 2.5 mg by mouth daily.    Marland Kitchen apixaban (ELIQUIS) 5 MG TABS tablet Take 2 tablets (10 mg total) by mouth 2 (two) times daily for 7 days, THEN 1 tablet (5 mg total) 2 (two) times daily for 23 days. 74 tablet 0  . dexamethasone (DECADRON) 4 MG tablet Take 2 tabs at the night before and 2 tab the morning  of chemotherapy, every 3 weeks, by mouth with food 24 tablet 9  . levothyroxine (SYNTHROID) 25 MCG tablet Take 1 tablet (25 mcg total) by mouth daily before breakfast. 30 tablet 11  . lidocaine-prilocaine (EMLA) cream Apply to affected area once (Patient taking differently: Apply 1 application topically as needed (port access). ) 30 g 3  . losartan (COZAAR) 50 MG tablet Take 50 mg by mouth daily.    Marland Kitchen omeprazole (PRILOSEC) 20 MG capsule Take 20 mg by mouth daily.    . ondansetron (ZOFRAN) 8 MG tablet Take 1 tablet (8 mg total) by mouth every 8 (eight) hours as needed for refractory nausea / vomiting. 30 tablet 1  . polyethylene glycol (MIRALAX / GLYCOLAX) 17 g packet Take 17 g by mouth daily as needed for mild constipation.     . prochlorperazine (COMPAZINE) 10 MG tablet Take 1 tablet (10 mg total) by mouth every 6 (six) hours as needed (Nausea or vomiting). 30 tablet 1  . traMADol (ULTRAM) 50 MG tablet Take 1 tablet (50 mg total) by mouth every 6 (six) hours as needed. (Patient taking differently: Take 50 mg by mouth every 6 (six) hours as needed for moderate pain. ) 30 tablet 0   No current facility-administered medications for this visit.    Facility-Administered Medications Ordered in Other Visits  Medication Dose Route Frequency Provider Last Rate Last Dose  . CARBOplatin (PARAPLATIN) 600 mg in sodium chloride 0.9 % 250 mL chemo infusion  600 mg Intravenous Once [name], MD      . heparin  lock flush 100 unit/mL  500 Units Intracatheter Once PRN Alvy Bimler, Yanky Vanderburg, MD      . PACLitaxel (TAXOL) 252 mg in sodium chloride 0.9 % 250 mL chemo infusion (> 80mg /m2)  BB:[id number] mg/m2 (Treatment Plan Recorded) Intravenous Once [name], MD 97 mL/hr at 02/17/19 1219 252 mg at 02/17/19 1219  . sodium chloride flush (NS) 0.9 % injection 10 mL  10 mL Intracatheter PRN Alvy Bimler, Evea Sheek, MD        PHYSICAL EXAMINATION: ECOG PERFORMANCE STATUS: 1 - Symptomatic but completely ambulatory  Vitals:   02/17/19 0953   BP: (!) 154/77  Pulse: 96  Resp: 19  Temp: 97.8 F (36.6 C)  SpO2: 99%   Filed Weights   02/17/19 0953  Weight: 208 lb 12.8 oz (94.7 kg)    GENERAL:alert, no distress and comfortable SKIN: skin color, texture, turgor are normal, no rashes or significant lesions EYES: normal, Conjunctiva are pink and non-injected, sclera clear OROPHARYNX:no exudate, no erythema and lips, buccal mucosa, and tongue normal  NECK: supple, thyroid normal size, non-tender, without nodularity LYMPH:  no palpable lymphadenopathy in the cervical, axillary or inguinal LUNGS: clear to auscultation and percussion with normal breathing effort HEART: regular rate & rhythm and no murmurs and no lower extremity edema ABDOMEN:abdomen soft, non-tender and normal bowel sounds Musculoskeletal:no cyanosis of digits and no clubbing  NEURO: alert & oriented x 3 with fluent speech, no focal motor/sensory deficits  LABORATORY DATA:  I have reviewed the data as listed    Component Value Date/Time   NA 138 02/17/2019 0940   K 4.3 02/17/2019 0940   CL 103 02/17/2019 0940   CO2 24 02/17/2019 0940   GLUCOSE 141 (H) 02/17/2019 0940   BUN 21 02/17/2019 0940   CREATININE 0.96 02/17/2019 0940   CALCIUM 10.4 (H) 02/17/2019 0940   PROT 7.8 02/17/2019 0940   ALBUMIN 3.5 02/17/2019 0940   AST 32 02/17/2019 0940   ALT 56 (H) 02/17/2019 0940   ALKPHOS 141 (H) 02/17/2019 0940   BILITOT 0.2 (L) 02/17/2019 0940   GFRNONAA 58 (L) 02/17/2019 0940   GFRAA >60 02/17/2019 0940    No results found for: SPEP, UPEP  Lab Results  Component Value Date   WBC 5.6 02/17/2019   NEUTROABS 4.2 02/17/2019   HGB 11.0 (L) 02/17/2019   HCT 33.0 (L) 02/17/2019   MCV 92.2 02/17/2019   PLT 217 02/17/2019      Chemistry      Component Value Date/Time   NA 138 02/17/2019 0940   K 4.3 02/17/2019 0940   CL 103 02/17/2019 0940   CO2 24 02/17/2019 0940   BUN 21 02/17/2019 0940   CREATININE 0.96 02/17/2019 0940      Component Value  Date/Time   CALCIUM 10.4 (H) 02/17/2019 0940   ALKPHOS 141 (H) 02/17/2019 0940   AST 32 02/17/2019 0940   ALT 56 (H) 02/17/2019 0940   BILITOT 0.2 (L) 02/17/2019 0940       RADIOGRAPHIC STUDIES: I have personally reviewed the radiological images as listed and agreed with the findings in the report. 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 Follow-up evaluation. EXAM: CT CHEST, ABDOMEN, AND PELVIS WITH CONTRAST TECHNIQUE: Multidetector CT imaging of the chest, abdomen and pelvis was performed following the standard protocol during bolus administration of intravenous contrast. CONTRAST:  171mL OMNIPAQUE IOHEXOL 300 MG/ML  SOLN COMPARISON:  Chest CT 12/12/2018. FINDINGS: CT CHEST FINDINGS Cardiovascular: Heart size is normal. There is no significant pericardial fluid, thickening or pericardial calcification. There is aortic atherosclerosis, as well as atherosclerosis of the great vessels of the mediastinum and the coronary arteries, including calcified atherosclerotic plaque in the left anterior descending, left circumflex and right coronary arteries. Filling defects are noted in the pulmonary arterial tree bilaterally, indicative of widespread pulmonary embolus. This includes saddle embolus, main, lobar, segmental and subsegmental sized emboli bilaterally. The largest burden of clot appears in the right lower lobe pulmonary arterial tree where there is a combination of occlusive and nonocclusive thrombus. Right internal jugular single-lumen porta cath with tip terminating in the superior aspect of the right atrium. Right ventricle measures approximately 4.2 cm in diameter. Left ventricle measures approximately 4.4 cm in diameter. RV to LV ratio of 0.95. Mediastinum/Nodes: No pathologically enlarged mediastinal or hilar lymph nodes. Esophagus is  unremarkable in appearance. No axillary lymphadenopathy. Lungs/Pleura: Multiple small predominantly pleural-based nodules are again noted throughout the lungs bilaterally, similar in size and number to the prior examination, nonspecific, but favored to be benign. No other larger more suspicious appearing pulmonary nodules or masses are noted. No acute consolidative airspace disease. Resolution of previously noted left pleural effusion. Small right pleural effusion has decreased in size compared to the prior study. Musculoskeletal: There are no aggressive appearing lytic or blastic lesions noted in the visualized portions of the skeleton. CT ABDOMEN PELVIS FINDINGS Hepatobiliary: No suspicious cystic or solid hepatic lesions. No intra or extrahepatic biliary ductal dilatation. Gallbladder is normal in appearance. Pancreas: No pancreatic mass. No pancreatic ductal dilatation. No pancreatic or peripancreatic fluid collections or inflammatory changes are noted. Spleen: Unremarkable. Adrenals/Urinary Tract: Multiple low-attenuation lesions in the left renal hilum, most compatible with small parapelvic cysts. No suspicious right renal lesions. Mild right hydroureteronephrosis, new compared to the prior study, which appears related to external compression on the distal third of the right ureter by the patient's large pelvic mass (discussed below). No left hydroureteronephrosis. Urinary bladder is unremarkable in appearance. Bilateral adrenal glands are normal in appearance. Stomach/Bowel: Normal appearance of the stomach. No pathologic dilatation of small bowel or colon. Appendix is not confidently identified may be surgically absent or atrophic. Vascular/Lymphatic: Aortic atherosclerosis, without evidence of aneurysm or dissection in the abdominal or pelvic vasculature. Filling defect in the right superficial femoral vein incompletely imaged (axial image 137 of series 2), compatible with deep venous thrombosis. No  lymphadenopathy noted in the abdomen or pelvis. Reproductive: Status post hysterectomy. Pessary in the vagina. Large cystic mass in the central aspect of the pelvis which has increased in size compared to the prior examination, largest of which measures 15.7 x 11.0 x 12.4 cm (axial image 108 of series 2 and coronal image 70 of series 5), previously 13.4 x 9.8 cm on 11/29/2018. Other: There continues to be scattered areas of abnormal soft tissue throughout the peritoneal cavity, indicative of widespread intraperitoneal metastatic disease, bulkiest portion of which is visualized on axial image 94 of series 2, overall decreased in size compared to the prior study. Trace ascites in the low anatomic pelvis. No pneumoperitoneum. Musculoskeletal: There are no aggressive appearing lytic or blastic lesions noted in the visualized portions of the skeleton. IMPRESSION: 1. Massive pulmonary embolism, as discussed above. Given the mildly elevated RV to LV ratio of 0.95, this is associated with increased risk of morbidity and mortality. 2. Today's study demonstrates a mixed response to therapy. Specifically, while there has been  regression of the bulky intraperitoneal metastatic disease and regression of previously noted pleural effusions, the large cystic mass in the central pelvis has increased in size compared to the prior study. 3. New right mild hydroureteronephrosis related to extrinsic compression on the distal third of the right ureter by the patient's large pelvic mass. 4. Scattered small pulmonary nodules (predominantly pleural based) appear stable compared to prior examinations. These are nonspecific but warrant continued attention on follow-up studies. 5. Aortic atherosclerosis, in addition to three-vessel coronary artery disease. Assessment for potential risk factor modification, dietary therapy or pharmacologic therapy may be warranted, if clinically indicated. 6. Additional incidental findings, as above. Critical  Value/emergent results were called by telephone at the time of interpretation on 02/02/2019 at 10:36 a.m. to Dr. [name] , who verbally acknowledged these results. Electronically Signed   By: Vinnie Langton M.D.   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 Follow-up evaluation. EXAM: CT CHEST, ABDOMEN, AND PELVIS WITH CONTRAST TECHNIQUE: Multidetector CT imaging of the chest, abdomen and pelvis was performed following the standard protocol during bolus administration of intravenous contrast. CONTRAST:  132mL OMNIPAQUE IOHEXOL 300 MG/ML  SOLN COMPARISON:  Chest CT 12/12/2018. FINDINGS: CT CHEST FINDINGS Cardiovascular: Heart size is normal. There is no significant pericardial fluid, thickening or pericardial calcification. There is aortic atherosclerosis, as well as atherosclerosis of the great vessels of the mediastinum and the coronary arteries, including calcified atherosclerotic plaque in the left anterior descending, left circumflex and right coronary arteries. Filling defects are noted in the pulmonary arterial tree bilaterally, indicative of widespread pulmonary embolus. This includes saddle embolus, main, lobar, segmental and subsegmental sized emboli bilaterally. The largest burden of clot appears in the right lower lobe pulmonary arterial tree where there is a combination of occlusive and nonocclusive thrombus. Right internal jugular single-lumen porta cath with tip terminating in the superior aspect of the right atrium. Right ventricle measures approximately 4.2 cm in diameter. Left ventricle measures approximately 4.4 cm in diameter. RV to LV ratio of 0.95. Mediastinum/Nodes: No pathologically enlarged mediastinal or hilar lymph nodes. Esophagus is unremarkable in appearance. No axillary lymphadenopathy.  Lungs/Pleura: Multiple small predominantly pleural-based nodules are again noted throughout the lungs bilaterally, similar in size and number to the prior examination, nonspecific, but favored to be benign. No other larger more suspicious appearing pulmonary nodules or masses are noted. No acute consolidative airspace disease. Resolution of previously noted left pleural effusion. Small right pleural effusion has decreased in size compared to the prior study. Musculoskeletal: There are no aggressive appearing lytic or blastic lesions noted in the visualized portions of the skeleton. CT ABDOMEN PELVIS FINDINGS Hepatobiliary: No suspicious cystic or solid hepatic lesions. No intra or extrahepatic biliary ductal dilatation. Gallbladder is normal in appearance. Pancreas: No pancreatic mass. No pancreatic ductal dilatation. No pancreatic or peripancreatic fluid collections or inflammatory changes are noted. Spleen: Unremarkable. Adrenals/Urinary Tract: Multiple low-attenuation lesions in the left renal hilum, most compatible with small parapelvic cysts. No suspicious right renal lesions. Mild right hydroureteronephrosis, new compared to the prior study, which appears related to external compression on the distal third of the right ureter by the patient's large pelvic mass (discussed below). No left hydroureteronephrosis. Urinary bladder is unremarkable in appearance. Bilateral adrenal glands are normal in appearance. Stomach/Bowel: Normal appearance of the stomach. No pathologic dilatation of small bowel or  colon. Appendix is not confidently identified may be surgically absent or atrophic. Vascular/Lymphatic: Aortic atherosclerosis, without evidence of aneurysm or dissection in the abdominal or pelvic vasculature. Filling defect in the right superficial femoral vein incompletely imaged (axial image 137 of series 2), compatible with deep venous thrombosis. No lymphadenopathy noted in the abdomen or pelvis. Reproductive:  Status post hysterectomy. Pessary in the vagina. Large cystic mass in the central aspect of the pelvis which has increased in size compared to the prior examination, largest of which measures 15.7 x 11.0 x 12.4 cm (axial image 108 of series 2 and coronal image 70 of series 5), previously 13.4 x 9.8 cm on 11/29/2018. Other: There continues to be scattered areas of abnormal soft tissue throughout the peritoneal cavity, indicative of widespread intraperitoneal metastatic disease, bulkiest portion of which is visualized on axial image 94 of series 2, overall decreased in size compared to the prior study. Trace ascites in the low anatomic pelvis. No pneumoperitoneum. Musculoskeletal: There are no aggressive appearing lytic or blastic lesions noted in the visualized portions of the skeleton. IMPRESSION: 1. Massive pulmonary embolism, as discussed above. Given the mildly elevated RV to LV ratio of 0.95, this is associated with increased risk of morbidity and mortality. 2. Today's study demonstrates a mixed response to therapy. Specifically, while there has been regression of the bulky intraperitoneal metastatic disease and regression of previously noted pleural effusions, the large cystic mass in the central pelvis has increased in size compared to the prior study. 3. New right mild hydroureteronephrosis related to extrinsic compression on the distal third of the right ureter by the patient's large pelvic mass. 4. Scattered small pulmonary nodules (predominantly pleural based) appear stable compared to prior examinations. These are nonspecific but warrant continued attention on follow-up studies. 5. Aortic atherosclerosis, in addition to three-vessel coronary artery disease. Assessment for potential risk factor modification, dietary therapy or pharmacologic therapy may be warranted, if clinically indicated. 6. Additional incidental findings, as above. Critical Value/emergent results were called by telephone at the time of  interpretation on 02/02/2019 at 10:36 a.m. to Dr. [name] , who verbally acknowledged these results. Electronically Signed   By: Vinnie Langton M.D.   On: 02/02/2019 11:33   Vas [place] Lower Extremity Venous (dvt)  Result Date: 02/05/2019  Lower Venous Study Indications: Pulmonary embolism.  Risk Factors: Cancer. Anticoagulation: Heparin. Comparison Study: No prior studies. Performing Technologist: Oliver Hum RVT  Examination Guidelines: A complete evaluation includes B-mode imaging, spectral Doppler, color Doppler, and power Doppler as needed of all accessible portions of each vessel. Bilateral testing is considered an integral part of a complete examination. Limited examinations for reoccurring indications may be performed as noted.  +---------+---------------+---------+-----------+----------+-------+ RIGHT    CompressibilityPhasicitySpontaneityPropertiesSummary +---------+---------------+---------+-----------+----------+-------+ CFV      Full           No       No                           +---------+---------------+---------+-----------+----------+-------+ SFJ      Full                                                 +---------+---------------+---------+-----------+----------+-------+ FV Prox  Partial        No       No  Acute   +---------+---------------+---------+-----------+----------+-------+ FV Mid   None           No       No                   Acute   +---------+---------------+---------+-----------+----------+-------+ FV DistalPartial        No       No                   Acute   +---------+---------------+---------+-----------+----------+-------+ POP      Partial        No       No                   Acute   +---------+---------------+---------+-----------+----------+-------+ PTV      Full                                                 +---------+---------------+---------+-----------+----------+-------+ PERO     Partial                                       Acute   +---------+---------------+---------+-----------+----------+-------+ Soleal   Partial                                      Acute   +---------+---------------+---------+-----------+----------+-------+ Gastroc  Partial                                      Acute   +---------+---------------+---------+-----------+----------+-------+   +---------+---------------+---------+-----------+----------+-------+ LEFT     CompressibilityPhasicitySpontaneityPropertiesSummary +---------+---------------+---------+-----------+----------+-------+ CFV      Full           Yes      Yes                          +---------+---------------+---------+-----------+----------+-------+ SFJ      Full                                                 +---------+---------------+---------+-----------+----------+-------+ FV Prox  Full                                                 +---------+---------------+---------+-----------+----------+-------+ FV Mid   Full                                                 +---------+---------------+---------+-----------+----------+-------+ FV DistalFull                                                 +---------+---------------+---------+-----------+----------+-------+ PFV  Full                                                 +---------+---------------+---------+-----------+----------+-------+ POP      Full           Yes      Yes                          +---------+---------------+---------+-----------+----------+-------+ PTV      Full                                                 +---------+---------------+---------+-----------+----------+-------+ PERO     Full                                                 +---------+---------------+---------+-----------+----------+-------+     Summary: Right: Findings consistent with acute deep vein thrombosis involving the right femoral vein,  right popliteal vein, right peroneal veins, right soleal veins, and right gastrocnemius veins. No cystic structure found in the popliteal fossa. Left: There is no evidence of deep vein thrombosis in the lower extremity. No cystic structure found in the popliteal fossa.  *See table(s) above for measurements and observations. Electronically signed by Harold Barban MD on 02/05/2019 at 12:34:10 PM.    Final     All questions were answered. The patient knows to call the clinic with any problems, questions or concerns. No barriers to learning was detected.  I spent 15 minutes counseling the patient face to face. The total time spent in the appointment was 20 minutes and more than 50% was on counseling and review of test results  [name], MD 02/17/2019 1:45 PM

## 2019-02-17 NOTE — Assessment & Plan Note (Signed)
She is still quite weak after recent diagnosis of PE but overall improving/stable We will continue treatment for total of 6 cycles before repeating CT imaging for assessment and possible surgery in the future

## 2019-02-17 NOTE — Patient Instructions (Signed)

## 2019-02-17 NOTE — Assessment & Plan Note (Signed)
She has mildly elevated liver enzymes We will monitor closely 

## 2019-02-18 LAB — CA 125: Cancer Antigen (CA) 125: 127 U/mL — ABNORMAL HIGH (ref 0.0–38.1)

## 2019-02-20 ENCOUNTER — Telehealth: Payer: Self-pay

## 2019-02-20 NOTE — Telephone Encounter (Signed)
Called and given below message to husband. He verbalized understanding. 

## 2019-02-20 NOTE — Telephone Encounter (Signed)
-----   Message from [name], MD sent at 02/20/2019 10:23 AM EDT ----- Regarding: CA-125 is better, let her know

## 2019-02-23 DIAGNOSIS — K210 Gastro-esophageal reflux disease with esophagitis: Secondary | ICD-10-CM | POA: Diagnosis not present

## 2019-02-23 DIAGNOSIS — R300 Dysuria: Secondary | ICD-10-CM | POA: Diagnosis not present

## 2019-02-23 DIAGNOSIS — Z0001 Encounter for general adult medical examination with abnormal findings: Secondary | ICD-10-CM | POA: Diagnosis not present

## 2019-02-23 DIAGNOSIS — I351 Nonrheumatic aortic (valve) insufficiency: Secondary | ICD-10-CM | POA: Diagnosis not present

## 2019-02-23 DIAGNOSIS — E782 Mixed hyperlipidemia: Secondary | ICD-10-CM | POA: Diagnosis not present

## 2019-02-23 DIAGNOSIS — E039 Hypothyroidism, unspecified: Secondary | ICD-10-CM | POA: Diagnosis not present

## 2019-02-23 MED FILL — LEVOTHYROXINE 25 MCG TABLET: 25 | 30 days supply | Qty: 30 | Fill #1

## 2019-02-28 DIAGNOSIS — I10 Essential (primary) hypertension: Secondary | ICD-10-CM | POA: Diagnosis not present

## 2019-02-28 DIAGNOSIS — Z23 Encounter for immunization: Secondary | ICD-10-CM | POA: Diagnosis not present

## 2019-02-28 DIAGNOSIS — Z0001 Encounter for general adult medical examination with abnormal findings: Secondary | ICD-10-CM | POA: Diagnosis not present

## 2019-02-28 DIAGNOSIS — L651 Anagen effluvium: Secondary | ICD-10-CM | POA: Diagnosis not present

## 2019-02-28 DIAGNOSIS — G459 Transient cerebral ischemic attack, unspecified: Secondary | ICD-10-CM | POA: Diagnosis not present

## 2019-03-02 ENCOUNTER — Telehealth: Payer: Self-pay

## 2019-03-02 ENCOUNTER — Other Ambulatory Visit: Payer: Self-pay | Admitting: Hematology and Oncology

## 2019-03-02 MED ORDER — APIXABAN 5 MG PO TABS: 5.0000 mg | ORAL_TABLET | Freq: Two times a day (BID) | ORAL | 11 refills | Status: DC

## 2019-03-02 MED FILL — ELIQUIS 5 MG TABLET: 5 | 30 days supply | Qty: 60 | Fill #0

## 2019-03-02 NOTE — Telephone Encounter (Signed)
[facility] called requesting Eliquis Rx. She usually gets Rx at [facility]. [name] has requested Rx transfer to [facility].

## 2019-03-02 NOTE — Telephone Encounter (Signed)
I sent refill to [facility] 

## 2019-03-06 DIAGNOSIS — E039 Hypothyroidism, unspecified: Secondary | ICD-10-CM | POA: Diagnosis not present

## 2019-03-06 DIAGNOSIS — I82401 Acute embolism and thrombosis of unspecified deep veins of right lower extremity: Secondary | ICD-10-CM | POA: Diagnosis not present

## 2019-03-06 DIAGNOSIS — I2699 Other pulmonary embolism without acute cor pulmonale: Secondary | ICD-10-CM | POA: Diagnosis not present

## 2019-03-10 ENCOUNTER — Inpatient Hospital Stay: Payer: Medicare Other

## 2019-03-10 ENCOUNTER — Other Ambulatory Visit: Payer: Self-pay

## 2019-03-10 ENCOUNTER — Inpatient Hospital Stay (HOSPITAL_BASED_OUTPATIENT_CLINIC_OR_DEPARTMENT_OTHER): Payer: Medicare Other | Admitting: Hematology and Oncology

## 2019-03-10 ENCOUNTER — Inpatient Hospital Stay: Payer: Medicare Other | Attending: Gynecologic Oncology

## 2019-03-10 ENCOUNTER — Encounter: Payer: Self-pay | Admitting: Hematology and Oncology

## 2019-03-10 DIAGNOSIS — G620 Drug-induced polyneuropathy: Secondary | ICD-10-CM | POA: Insufficient documentation

## 2019-03-10 DIAGNOSIS — I2699 Other pulmonary embolism without acute cor pulmonale: Secondary | ICD-10-CM | POA: Diagnosis not present

## 2019-03-10 DIAGNOSIS — Z79899 Other long term (current) drug therapy: Secondary | ICD-10-CM

## 2019-03-10 DIAGNOSIS — C786 Secondary malignant neoplasm of retroperitoneum and peritoneum: Secondary | ICD-10-CM | POA: Diagnosis not present

## 2019-03-10 DIAGNOSIS — D61818 Other pancytopenia: Secondary | ICD-10-CM | POA: Insufficient documentation

## 2019-03-10 DIAGNOSIS — C569 Malignant neoplasm of unspecified ovary: Secondary | ICD-10-CM

## 2019-03-10 DIAGNOSIS — Z7901 Long term (current) use of anticoagulants: Secondary | ICD-10-CM | POA: Insufficient documentation

## 2019-03-10 DIAGNOSIS — R140 Abdominal distension (gaseous): Secondary | ICD-10-CM | POA: Insufficient documentation

## 2019-03-10 DIAGNOSIS — Z5111 Encounter for antineoplastic chemotherapy: Secondary | ICD-10-CM | POA: Insufficient documentation

## 2019-03-10 DIAGNOSIS — T451X5A Adverse effect of antineoplastic and immunosuppressive drugs, initial encounter: Secondary | ICD-10-CM | POA: Insufficient documentation

## 2019-03-10 DIAGNOSIS — I10 Essential (primary) hypertension: Secondary | ICD-10-CM | POA: Diagnosis not present

## 2019-03-10 LAB — CMP (CANCER CENTER ONLY)
ALT: 31 U/L (ref 0–44)
AST: 22 U/L (ref 15–41)
Albumin: 3.8 g/dL (ref 3.5–5.0)
Alkaline Phosphatase: 100 U/L (ref 38–126)
Anion gap: 10 (ref 5–15)
BUN: 17 mg/dL (ref 8–23)
CO2: 24 mmol/L (ref 22–32)
Calcium: 10.1 mg/dL (ref 8.9–10.3)
Chloride: 103 mmol/L (ref 98–111)
Creatinine: 1.01 mg/dL — ABNORMAL HIGH (ref 0.44–1.00)
GFR, Est AFR Am: >60 mL/min (ref >60)
GFR, Estimated: 54 mL/min — ABNORMAL LOW (ref >60)
Glucose, Bld: 189 mg/dL — ABNORMAL HIGH (ref 70–99)
Potassium: 4.1 mmol/L (ref 3.5–5.1)
Sodium: 137 mmol/L (ref 135–145)
Total Bilirubin: 0.3 mg/dL (ref 0.3–1.2)
Total Protein: 7.6 g/dL (ref 6.5–8.1)

## 2019-03-10 LAB — CBC WITH DIFFERENTIAL (CANCER CENTER ONLY)
Abs Immature Granulocytes: 0.01 10*3/uL (ref 0.00–0.07)
Basophils Absolute: 0 10*3/uL (ref 0.0–0.1)
Basophils Relative: 0 %
Eosinophils Absolute: 0 10*3/uL (ref 0.0–0.5)
Eosinophils Relative: 0 %
HCT: 33 % — ABNORMAL LOW (ref 36.0–46.0)
Hemoglobin: 11.1 g/dL — ABNORMAL LOW (ref 12.0–15.0)
Immature Granulocytes: 0 %
Lymphocytes Relative: 22 %
Lymphs Abs: 0.8 10*3/uL (ref 0.7–4.0)
MCH: 31.9 pg (ref 26.0–34.0)
MCHC: 33.6 g/dL (ref 30.0–36.0)
MCV: 94.8 fL (ref 80.0–100.0)
Monocytes Absolute: 0.1 10*3/uL (ref 0.1–1.0)
Monocytes Relative: 3 %
Neutro Abs: 2.8 10*3/uL (ref 1.7–7.7)
Neutrophils Relative %: 75 %
Platelet Count: 193 10*3/uL (ref 150–400)
RBC: 3.48 MIL/uL — ABNORMAL LOW (ref 3.87–5.11)
RDW: 17.7 % — ABNORMAL HIGH (ref 11.5–15.5)
WBC Count: 3.7 10*3/uL — ABNORMAL LOW (ref 4.0–10.5)
nRBC: 0 % (ref 0.0–0.2)

## 2019-03-10 MED ORDER — FAMOTIDINE IN NACL 20-0.9 MG/50ML-% IV SOLN
INTRAVENOUS | Status: AC
  Filled 2019-03-10: qty 50

## 2019-03-10 MED ORDER — PALONOSETRON HCL INJECTION 0.25 MG/5ML
INTRAVENOUS | Status: AC
  Filled 2019-03-10: qty 5

## 2019-03-10 MED ORDER — LEVOTHYROXINE SODIUM 50 MCG PO TABS: 50.0000 ug | ORAL_TABLET | Freq: Every day | ORAL | Status: DC

## 2019-03-10 MED ORDER — HEPARIN SOD (PORK) LOCK FLUSH 100 UNIT/ML IV SOLN
500.0000 [IU] | Freq: Once | INTRAVENOUS | Status: AC | PRN
  Administered 2019-03-10: 500 [IU]
  Filled 2019-03-10: qty 5

## 2019-03-10 MED ORDER — SODIUM CHLORIDE 0.9 % IV SOLN
116.6667 mg/m2 | Freq: Once | INTRAVENOUS | Status: AC
  Administered 2019-03-10: 252 mg via INTRAVENOUS
  Filled 2019-03-10: qty 42

## 2019-03-10 MED ORDER — DIPHENHYDRAMINE HCL 50 MG/ML IJ SOLN
50.0000 mg | Freq: Once | INTRAMUSCULAR | Status: AC
  Administered 2019-03-10: 50 mg via INTRAVENOUS

## 2019-03-10 MED ORDER — SODIUM CHLORIDE 0.9% FLUSH
10.0000 mL | INTRAVENOUS | Status: DC | PRN
  Administered 2019-03-10: 10 mL
  Filled 2019-03-10: qty 10

## 2019-03-10 MED ORDER — SODIUM CHLORIDE 0.9 % IV SOLN
592.2000 mg | Freq: Once | INTRAVENOUS | Status: AC
  Administered 2019-03-10: 590 mg via INTRAVENOUS
  Filled 2019-03-10: qty 59

## 2019-03-10 MED ORDER — SODIUM CHLORIDE 0.9 % IV SOLN
Freq: Once | INTRAVENOUS | Status: AC
  Administered 2019-03-10: 12:00:00 via INTRAVENOUS
  Filled 2019-03-10: qty 5

## 2019-03-10 MED ORDER — SODIUM CHLORIDE 0.9 % IV SOLN
Freq: Once | INTRAVENOUS | Status: AC
  Administered 2019-03-10: 11:00:00 via INTRAVENOUS
  Filled 2019-03-10: qty 250

## 2019-03-10 MED ORDER — SODIUM CHLORIDE 0.9% FLUSH
10.0000 mL | Freq: Once | INTRAVENOUS | Status: AC
  Administered 2019-03-10: 10:00:00 10 mL
  Filled 2019-03-10: qty 10

## 2019-03-10 MED ORDER — PALONOSETRON HCL INJECTION 0.25 MG/5ML
0.2500 mg | Freq: Once | INTRAVENOUS | Status: AC
  Administered 2019-03-10: 11:00:00 0.25 mg via INTRAVENOUS

## 2019-03-10 MED ORDER — DIPHENHYDRAMINE HCL 50 MG/ML IJ SOLN
INTRAMUSCULAR | Status: AC
  Filled 2019-03-10: qty 1

## 2019-03-10 MED ORDER — FAMOTIDINE IN NACL 20-0.9 MG/50ML-% IV SOLN
20.0000 mg | Freq: Once | INTRAVENOUS | Status: AC
  Administered 2019-03-10: 20 mg via INTRAVENOUS

## 2019-03-10 NOTE — Patient Instructions (Signed)
[facility] Discharge Instructions for Patients Receiving Chemotherapy  Today you received the following chemotherapy agents:  Taxol, Carboplatin  To help prevent nausea and vomiting after your treatment, we encourage you to take your nausea medication as prescribed.   If you develop nausea and vomiting that is not controlled by your nausea medication, call the clinic.   BELOW ARE SYMPTOMS THAT SHOULD BE REPORTED IMMEDIATELY:  *FEVER GREATER THAN 100.5 F  *CHILLS WITH OR WITHOUT FEVER  NAUSEA AND VOMITING THAT IS NOT CONTROLLED WITH YOUR NAUSEA MEDICATION  *UNUSUAL SHORTNESS OF BREATH  *UNUSUAL BRUISING OR BLEEDING  TENDERNESS IN MOUTH AND THROAT WITH OR WITHOUT PRESENCE OF ULCERS  *URINARY PROBLEMS  *BOWEL PROBLEMS  UNUSUAL RASH Items with * indicate a potential emergency and should be followed up as soon as possible.  Feel free to call the clinic should you have any questions or concerns. The clinic phone number is [phone].  Please show the CHEMO ALERT CARD at check-in to the Emergency Department and triage nurse.   

## 2019-03-10 NOTE — Assessment & Plan Note (Signed)
She tolerated recent chemotherapy well without major side effects We will continue treatment with similar dose adjustment We will continue treatment for a few more cycles before repeating CT imaging for assessment and possible surgery in the future

## 2019-03-10 NOTE — Assessment & Plan Note (Signed)
She is not symptomatic from treatment We will proceed without dose adjustment

## 2019-03-10 NOTE — Assessment & Plan Note (Signed)
She tolerated anticoagulation therapy very well She will continue for at least 6 months I recommend minimum 3 months of treatment before bridging therapy prior to surgical intervention The patient is educated to watch out for signs and symptoms of bleeding 

## 2019-03-10 NOTE — Progress Notes (Signed)
[place] OFFICE PROGRESS NOTE  Patient Care Team: Caryl Bis, MD as PCP - General (Family Medicine)  ASSESSMENT & PLAN:  Ovarian cancer [facility]) She tolerated recent chemotherapy well without major side effects We will continue treatment with similar dose adjustment We will continue treatment for a few more cycles before repeating CT imaging for assessment and possible surgery in the future  PE (pulmonary thromboembolism) ([place]) She tolerated anticoagulation therapy very well She will continue for at least 6 months I recommend minimum 3 months of treatment before bridging therapy prior to surgical intervention The patient is educated to watch out for signs and symptoms of bleeding  Pancytopenia, acquired ([place]) She is not symptomatic from treatment We will proceed without dose adjustment  Peripheral neuropathy due to chemotherapy [facility]) She has stable peripheral neuropathy since recent treatment We will continue with reduced dose paclitaxel   No orders of the defined types were placed in this encounter.   INTERVAL HISTORY: Please see below for problem oriented charting. She returns for further follow-up Her family member is also available over the telephone to collaborate her history She tolerated last cycle of treatment well She has less bloating No nausea or constipation She has very minimal peripheral neuropathy affecting her toes but not severe It does not bother her She denies recent bleeding complications  SUMMARY OF ONCOLOGIC HISTORY: Oncology History  Ovarian cancer ([place])  10/24/2018 Initial Diagnosis   Her symptoms began in April/May, 2020.  She has bloating and early satiety. Shortness of breath with walking. She denied bleeding. She reported constipation with pain with defecation and narrowed stools   11/29/2018 Imaging   1. 13 cm complex cystic lesion in the central pelvis, highly suspicious for ovarian cystadenocarcinoma. 2. Diffuse  peritoneal carcinomatosis with mild ascites. 3. Mild lymphadenopathy in porta hepatis and right cardiophrenic angle, suspicious for metastatic disease. 4. Moderate right and tiny left pleural effusions   12/05/2018 Tumor Marker   Patient's tumor was tested for the following markers: CA-125 Results of the tumor marker test revealed 1015.   12/06/2018 Cancer Staging   Staging form: Ovary, Fallopian Tube, and Primary Peritoneal Carcinoma, AJCC 8th Edition - Clinical: FIGO Stage IVA, calculated as Stage IV (cT3c, cN1, cM1) - Signed by [name], MD on 12/06/2018   12/09/2018 Pathology Results   PLEURAL FLUID, RIGHT (SPECIMEN 1 OF 1 COLLECTED 12/09/18): - MALIGNANT CELLS CONSISTENT WITH METASTATIC ADENOCARCINOMA - SEE COMMENT Comment The neoplastic cells are positive for cytokeratin 7 and Pax-8 but negative for cytokeratin 20, TTF-1, CDX-2 and Gata-3. Overall, the phenotype is consistent with the clinical impression of gynecologic primary.    12/15/2018 Procedure   Placement of single lumen port a cath via right internal jugular vein. The catheter tip lies at the cavo-atrial junction. A power injectable port a cath was placed and is ready for immediate use   12/16/2018 -  Chemotherapy   The patient had carboplatin and taxol for chemotherapy treatment.     01/06/2019 Tumor Marker   Patient's tumor was tested for the following markers: CA-125 Results of the tumor marker test revealed 948   02/02/2019 Imaging   1. Massive pulmonary embolism, as discussed above. Given the mildly elevated RV to LV ratio of 0.95, this is associated with increased risk of morbidity and mortality. 2. Today's study demonstrates a mixed response to therapy. Specifically, while there has been regression of the bulky intraperitoneal metastatic disease and regression of previously noted pleural effusions, the large cystic mass in  the central pelvis has increased in size compared to the prior study. 3. New right mild  hydroureteronephrosis related to extrinsic compression on the distal third of the right ureter by the patient's large pelvic mass. 4. Scattered small pulmonary nodules (predominantly pleural based) appear stable compared to prior examinations. These are nonspecific but warrant continued attention on follow-up studies. 5. Aortic atherosclerosis, in addition to three-vessel coronary artery disease. Assessment for potential risk factor modification, dietary therapy or pharmacologic therapy may be warranted, if clinically indicated.   02/02/2019 - 02/04/2019 Hospital Admission   She was admitted the hospital due to DVT and PE   02/03/2019 Imaging   Bilateral venous Doppler US Right: Findings consistent with acute deep vein thrombosis involving the right femoral vein, right popliteal vein, right peroneal veins, right soleal veins, and right gastrocnemius veins. No cystic structure found in the popliteal fossa. Left: There is no evidence of deep vein thrombosis in the lower extremity. No cystic structure found in the popliteal fossa   02/17/2019 Tumor Marker   Patient's tumor was tested for the following markers: CA-125 Results of the tumor marker test revealed 127     REVIEW OF SYSTEMS:   Constitutional: Denies fevers, chills or abnormal weight loss Eyes: Denies blurriness of vision Ears, nose, mouth, throat, and face: Denies mucositis or sore throat Respiratory: Denies cough, dyspnea or wheezes Cardiovascular: Denies palpitation, chest discomfort or lower extremity swelling Gastrointestinal:  Denies nausea, heartburn or change in bowel habits Skin: Denies abnormal skin rashes Lymphatics: Denies new lymphadenopathy or easy bruising Behavioral/Psych: Mood is stable, no new changes  All other systems were reviewed with the patient and are negative.  I have reviewed the past medical history, past surgical history, social history and family history with the patient and they are unchanged from previous  note.  ALLERGIES:  has No Known Allergies.  MEDICATIONS:  Current Outpatient Medications  Medication Sig Dispense Refill  . famotidine (PEPCID) 40 MG tablet Take 40 mg by mouth 2 (two) times daily.    Marland Kitchen amLODipine (NORVASC) 2.5 MG tablet Take 2.5 mg by mouth daily.    Marland Kitchen apixaban (ELIQUIS) 5 MG TABS tablet Take 1 tablet (5 mg total) by mouth 2 (two) times daily. 60 tablet 11  . dexamethasone (DECADRON) 4 MG tablet Take 2 tabs at the night before and 2 tab the morning of chemotherapy, every 3 weeks, by mouth with food 24 tablet 9  . levothyroxine (SYNTHROID) 50 MCG tablet Take 1 tablet (50 mcg total) by mouth daily before breakfast.    . lidocaine-prilocaine (EMLA) cream Apply to affected area once (Patient taking differently: Apply 1 application topically as needed (port access). ) 30 g 3  . losartan (COZAAR) 50 MG tablet Take 50 mg by mouth daily.    . ondansetron (ZOFRAN) 8 MG tablet Take 1 tablet (8 mg total) by mouth every 8 (eight) hours as needed for refractory nausea / vomiting. 30 tablet 1  . polyethylene glycol (MIRALAX / GLYCOLAX) 17 g packet Take 17 g by mouth daily as needed for mild constipation.     . prochlorperazine (COMPAZINE) 10 MG tablet Take 1 tablet (10 mg total) by mouth every 6 (six) hours as needed (Nausea or vomiting). 30 tablet 1  . traMADol (ULTRAM) 50 MG tablet Take 1 tablet (50 mg total) by mouth every 6 (six) hours as needed. (Patient taking differently: Take 50 mg by mouth every 6 (six) hours as needed for moderate pain. ) 30 tablet 0  No current facility-administered medications for this visit.     PHYSICAL EXAMINATION: ECOG PERFORMANCE STATUS: 1 - Symptomatic but completely ambulatory  Vitals:   03/10/19 1019  BP: (!) 146/73  Pulse: 95  Resp: 18  Temp: 98.3 F (36.8 C)  SpO2: 97%   Filed Weights   03/10/19 1019  Weight: 213 lb 9.6 oz (96.9 kg)    GENERAL:alert, no distress and comfortable SKIN: skin color, texture, turgor are normal, no rashes  or significant lesions EYES: normal, Conjunctiva are pink and non-injected, sclera clear OROPHARYNX:no exudate, no erythema and lips, buccal mucosa, and tongue normal  NECK: supple, thyroid normal size, non-tender, without nodularity LYMPH:  no palpable lymphadenopathy in the cervical, axillary or inguinal LUNGS: clear to auscultation and percussion with normal breathing effort HEART: regular rate & rhythm and no murmurs and no lower extremity edema ABDOMEN:abdomen soft, non-tender and normal bowel sounds Musculoskeletal:no cyanosis of digits and no clubbing  NEURO: alert & oriented x 3 with fluent speech, no focal motor/sensory deficits  LABORATORY DATA:  I have reviewed the data as listed    Component Value Date/Time   NA 137 03/10/2019 0925   K 4.1 03/10/2019 0925   CL 103 03/10/2019 0925   CO2 24 03/10/2019 0925   GLUCOSE 189 (H) 03/10/2019 0925   BUN 17 03/10/2019 0925   CREATININE 1.01 (H) 03/10/2019 0925   CALCIUM 10.1 03/10/2019 0925   PROT 7.6 03/10/2019 0925   ALBUMIN 3.8 03/10/2019 0925   AST 22 03/10/2019 0925   ALT 31 03/10/2019 0925   ALKPHOS 100 03/10/2019 0925   BILITOT 0.3 03/10/2019 0925   GFRNONAA 54 (L) 03/10/2019 0925   GFRAA >60 03/10/2019 0925    No results found for: SPEP, UPEP  Lab Results  Component Value Date   WBC 3.7 (L) 03/10/2019   NEUTROABS 2.8 03/10/2019   HGB 11.1 (L) 03/10/2019   HCT 33.0 (L) 03/10/2019   MCV 94.8 03/10/2019   PLT 193 03/10/2019      Chemistry      Component Value Date/Time   NA 137 03/10/2019 0925   K 4.1 03/10/2019 0925   CL 103 03/10/2019 0925   CO2 24 03/10/2019 0925   BUN 17 03/10/2019 0925   CREATININE 1.01 (H) 03/10/2019 0925      Component Value Date/Time   CALCIUM 10.1 03/10/2019 0925   ALKPHOS 100 03/10/2019 0925   AST 22 03/10/2019 0925   ALT 31 03/10/2019 0925   BILITOT 0.3 03/10/2019 0925       All questions were answered. The patient knows to call the clinic with any problems, questions  or concerns. No barriers to learning was detected.  I spent 15 minutes counseling the patient face to face. The total time spent in the appointment was 20 minutes and more than 50% was on counseling and review of test results  [name], MD 03/10/2019 10:47 AM

## 2019-03-10 NOTE — Assessment & Plan Note (Signed)
She has stable peripheral neuropathy since recent treatment We will continue with reduced dose paclitaxel

## 2019-03-11 LAB — CA 125: Cancer Antigen (CA) 125: 87.4 U/mL — ABNORMAL HIGH (ref 0.0–38.1)

## 2019-03-13 ENCOUNTER — Telehealth: Payer: Self-pay | Admitting: *Deleted

## 2019-03-13 NOTE — Telephone Encounter (Signed)
Patient advised lab results as directed below. Patient verbalized an understanding.

## 2019-03-14 ENCOUNTER — Emergency Department (HOSPITAL_COMMUNITY): Payer: Medicare Other

## 2019-03-14 ENCOUNTER — Other Ambulatory Visit: Payer: Self-pay

## 2019-03-14 ENCOUNTER — Inpatient Hospital Stay (HOSPITAL_COMMUNITY)
Admission: EM | Admit: 2019-03-14 | Discharge: 2019-03-16 | DRG: 312 | Disposition: A | Discharge status: Home / Self Care | Payer: Medicare Other | Attending: Internal Medicine | Admitting: Internal Medicine

## 2019-03-14 ENCOUNTER — Observation Stay (HOSPITAL_COMMUNITY): Payer: Medicare Other

## 2019-03-14 ENCOUNTER — Encounter (HOSPITAL_COMMUNITY): Payer: Self-pay

## 2019-03-14 DIAGNOSIS — I2699 Other pulmonary embolism without acute cor pulmonale: Secondary | ICD-10-CM | POA: Diagnosis present

## 2019-03-14 DIAGNOSIS — E039 Hypothyroidism, unspecified: Secondary | ICD-10-CM | POA: Diagnosis present

## 2019-03-14 DIAGNOSIS — I2782 Chronic pulmonary embolism: Secondary | ICD-10-CM | POA: Diagnosis not present

## 2019-03-14 DIAGNOSIS — I959 Hypotension, unspecified: Secondary | ICD-10-CM | POA: Diagnosis present

## 2019-03-14 DIAGNOSIS — R51 Headache: Secondary | ICD-10-CM | POA: Diagnosis not present

## 2019-03-14 DIAGNOSIS — Z79899 Other long term (current) drug therapy: Secondary | ICD-10-CM | POA: Diagnosis not present

## 2019-03-14 DIAGNOSIS — J9811 Atelectasis: Secondary | ICD-10-CM | POA: Diagnosis not present

## 2019-03-14 DIAGNOSIS — Z8673 Personal history of transient ischemic attack (TIA), and cerebral infarction without residual deficits: Secondary | ICD-10-CM | POA: Diagnosis not present

## 2019-03-14 DIAGNOSIS — R531 Weakness: Secondary | ICD-10-CM

## 2019-03-14 DIAGNOSIS — I82451 Acute embolism and thrombosis of right peroneal vein: Secondary | ICD-10-CM | POA: Diagnosis present

## 2019-03-14 DIAGNOSIS — J189 Pneumonia, unspecified organism: Secondary | ICD-10-CM

## 2019-03-14 DIAGNOSIS — Z66 Do not resuscitate: Secondary | ICD-10-CM | POA: Diagnosis not present

## 2019-03-14 DIAGNOSIS — Z8249 Family history of ischemic heart disease and other diseases of the circulatory system: Secondary | ICD-10-CM | POA: Diagnosis not present

## 2019-03-14 DIAGNOSIS — I2609 Other pulmonary embolism with acute cor pulmonale: Secondary | ICD-10-CM | POA: Diagnosis not present

## 2019-03-14 DIAGNOSIS — Z20828 Contact with and (suspected) exposure to other viral communicable diseases: Secondary | ICD-10-CM | POA: Diagnosis present

## 2019-03-14 DIAGNOSIS — I10 Essential (primary) hypertension: Secondary | ICD-10-CM | POA: Diagnosis present

## 2019-03-14 DIAGNOSIS — R5381 Other malaise: Secondary | ICD-10-CM | POA: Diagnosis not present

## 2019-03-14 DIAGNOSIS — I952 Hypotension due to drugs: Secondary | ICD-10-CM | POA: Diagnosis not present

## 2019-03-14 DIAGNOSIS — I82431 Acute embolism and thrombosis of right popliteal vein: Secondary | ICD-10-CM | POA: Diagnosis present

## 2019-03-14 DIAGNOSIS — Z79890 Hormone replacement therapy (postmenopausal): Secondary | ICD-10-CM

## 2019-03-14 DIAGNOSIS — Z7901 Long term (current) use of anticoagulants: Secondary | ICD-10-CM

## 2019-03-14 DIAGNOSIS — T451X5A Adverse effect of antineoplastic and immunosuppressive drugs, initial encounter: Secondary | ICD-10-CM | POA: Diagnosis present

## 2019-03-14 DIAGNOSIS — I82461 Acute embolism and thrombosis of right calf muscular vein: Secondary | ICD-10-CM | POA: Diagnosis present

## 2019-03-14 DIAGNOSIS — E861 Hypovolemia: Secondary | ICD-10-CM | POA: Diagnosis not present

## 2019-03-14 DIAGNOSIS — I9589 Other hypotension: Secondary | ICD-10-CM | POA: Diagnosis not present

## 2019-03-14 DIAGNOSIS — R402142 Coma scale, eyes open, spontaneous, at arrival to emergency department: Secondary | ICD-10-CM | POA: Diagnosis not present

## 2019-03-14 DIAGNOSIS — R402252 Coma scale, best verbal response, oriented, at arrival to emergency department: Secondary | ICD-10-CM | POA: Diagnosis present

## 2019-03-14 DIAGNOSIS — Z800 Family history of malignant neoplasm of digestive organs: Secondary | ICD-10-CM | POA: Diagnosis not present

## 2019-03-14 DIAGNOSIS — R402362 Coma scale, best motor response, obeys commands, at arrival to emergency department: Secondary | ICD-10-CM | POA: Diagnosis present

## 2019-03-14 DIAGNOSIS — K219 Gastro-esophageal reflux disease without esophagitis: Secondary | ICD-10-CM | POA: Diagnosis not present

## 2019-03-14 DIAGNOSIS — R4020 Unspecified coma: Secondary | ICD-10-CM | POA: Diagnosis not present

## 2019-03-14 DIAGNOSIS — R0602 Shortness of breath: Secondary | ICD-10-CM | POA: Diagnosis not present

## 2019-03-14 DIAGNOSIS — C569 Malignant neoplasm of unspecified ovary: Secondary | ICD-10-CM | POA: Diagnosis present

## 2019-03-14 DIAGNOSIS — R0789 Other chest pain: Secondary | ICD-10-CM | POA: Diagnosis not present

## 2019-03-14 DIAGNOSIS — I82411 Acute embolism and thrombosis of right femoral vein: Secondary | ICD-10-CM | POA: Diagnosis present

## 2019-03-14 DIAGNOSIS — Z90710 Acquired absence of both cervix and uterus: Secondary | ICD-10-CM

## 2019-03-14 LAB — COMPREHENSIVE METABOLIC PANEL
ALT: 29 U/L (ref 0–44)
AST: 24 U/L (ref 15–41)
Albumin: 3.6 g/dL (ref 3.5–5.0)
Alkaline Phosphatase: 64 U/L (ref 38–126)
Anion gap: 12 (ref 5–15)
BUN: 19 mg/dL (ref 8–23)
CO2: 25 mmol/L (ref 22–32)
Calcium: 9.8 mg/dL (ref 8.9–10.3)
Chloride: 97 mmol/L — ABNORMAL LOW (ref 98–111)
Creatinine, Ser: 0.84 mg/dL (ref 0.44–1.00)
GFR calc Af Amer: >60 mL/min (ref >60)
GFR calc non Af Amer: >60 mL/min (ref >60)
Glucose, Bld: 155 mg/dL — ABNORMAL HIGH (ref 70–99)
Potassium: 3.7 mmol/L (ref 3.5–5.1)
Sodium: 134 mmol/L — ABNORMAL LOW (ref 135–145)
Total Bilirubin: 0.6 mg/dL (ref 0.3–1.2)
Total Protein: 6.8 g/dL (ref 6.5–8.1)

## 2019-03-14 LAB — URINALYSIS, ROUTINE W REFLEX MICROSCOPIC
Bilirubin Urine: NEGATIVE
Glucose, UA: NEGATIVE mg/dL
Hgb urine dipstick: NEGATIVE
Ketones, ur: NEGATIVE mg/dL
Leukocytes,Ua: NEGATIVE
Nitrite: NEGATIVE
Protein, ur: NEGATIVE mg/dL
Specific Gravity, Urine: 1.017 (ref 1.005–1.030)
pH: 7 (ref 5.0–8.0)

## 2019-03-14 LAB — LACTIC ACID, PLASMA
Lactic Acid, Venous: 1.8 mmol/L (ref 0.5–1.9)
Lactic Acid, Venous: 2.8 mmol/L (ref 0.5–1.9)
Lactic Acid, Venous: 1.9 mmol/L (ref 0.5–1.9)
Lactic Acid, Venous: 1.4 mmol/L (ref 0.5–1.9)

## 2019-03-14 LAB — CBC
HCT: 30.4 % — ABNORMAL LOW (ref 36.0–46.0)
Hemoglobin: 10.2 g/dL — ABNORMAL LOW (ref 12.0–15.0)
MCH: 32.8 pg (ref 26.0–34.0)
MCHC: 33.6 g/dL (ref 30.0–36.0)
MCV: 97.7 fL (ref 80.0–100.0)
Platelets: 146 10*3/uL — ABNORMAL LOW (ref 150–400)
RBC: 3.11 MIL/uL — ABNORMAL LOW (ref 3.87–5.11)
RDW: 17.8 % — ABNORMAL HIGH (ref 11.5–15.5)
WBC: 4.4 10*3/uL (ref 4.0–10.5)
nRBC: 0 % (ref 0.0–0.2)

## 2019-03-14 LAB — TROPONIN I (HIGH SENSITIVITY)
Troponin I (High Sensitivity): 5 ng/L (ref <18)
Troponin I (High Sensitivity): 5 ng/L (ref <18)

## 2019-03-14 LAB — SARS CORONAVIRUS 2 BY RT PCR (HOSPITAL ORDER, PERFORMED IN ~~LOC~~ HOSPITAL LAB): SARS Coronavirus 2: NEGATIVE

## 2019-03-14 LAB — CBG MONITORING, ED: Glucose-Capillary: 222 mg/dL — ABNORMAL HIGH (ref 70–99)

## 2019-03-14 LAB — PROCALCITONIN: Procalcitonin: <0.1 ng/mL

## 2019-03-14 MED ORDER — ACETAMINOPHEN 325 MG PO TABS
650.0000 mg | ORAL_TABLET | Freq: Four times a day (QID) | ORAL | Status: DC | PRN
  Administered 2019-03-14 – 2019-03-15 (×3): 650 mg via ORAL
  Filled 2019-03-14 (×3): qty 2

## 2019-03-14 MED ORDER — SODIUM CHLORIDE 0.9 % IV SOLN
2.0000 g | Freq: Three times a day (TID) | INTRAVENOUS | Status: DC
  Administered 2019-03-14 – 2019-03-15 (×3): 2 g via INTRAVENOUS
  Filled 2019-03-14 (×4): qty 2

## 2019-03-14 MED ORDER — POLYETHYLENE GLYCOL 3350 17 G PO PACK
17.0000 g | PACK | Freq: Every day | ORAL | Status: DC
  Administered 2019-03-15 – 2019-03-16 (×2): 17 g via ORAL
  Filled 2019-03-14 (×2): qty 1

## 2019-03-14 MED ORDER — SODIUM CHLORIDE 0.9% FLUSH: 3.0000 mL | Freq: Once | INTRAVENOUS | Status: DC

## 2019-03-14 MED ORDER — AMLODIPINE BESYLATE 5 MG PO TABS
2.5000 mg | ORAL_TABLET | Freq: Every day | ORAL | Status: DC
  Administered 2019-03-15: 2.5 mg via ORAL
  Filled 2019-03-14: qty 1

## 2019-03-14 MED ORDER — PROCHLORPERAZINE MALEATE 10 MG PO TABS
10.0000 mg | ORAL_TABLET | Freq: Four times a day (QID) | ORAL | Status: DC | PRN
  Filled 2019-03-14: qty 1

## 2019-03-14 MED ORDER — ONDANSETRON HCL 8 MG PO TABS: 8.0000 mg | ORAL_TABLET | Freq: Three times a day (TID) | ORAL | Status: DC | PRN

## 2019-03-14 MED ORDER — DEXAMETHASONE 4 MG PO TABS: 8.0000 mg | ORAL_TABLET | ORAL | Status: DC

## 2019-03-14 MED ORDER — APIXABAN 5 MG PO TABS
5.0000 mg | ORAL_TABLET | Freq: Two times a day (BID) | ORAL | Status: DC
  Administered 2019-03-14 – 2019-03-16 (×4): 5 mg via ORAL
  Filled 2019-03-14 (×4): qty 1

## 2019-03-14 MED ORDER — SODIUM CHLORIDE 0.9 % IV SOLN
INTRAVENOUS | Status: DC
  Administered 2019-03-14 – 2019-03-16 (×3): via INTRAVENOUS

## 2019-03-14 MED ORDER — LEVOTHYROXINE SODIUM 50 MCG PO TABS
50.0000 ug | ORAL_TABLET | Freq: Every day | ORAL | Status: DC
  Administered 2019-03-15 – 2019-03-16 (×2): 50 ug via ORAL
  Filled 2019-03-14 (×2): qty 1

## 2019-03-14 MED ORDER — LOSARTAN POTASSIUM 50 MG PO TABS
50.0000 mg | ORAL_TABLET | Freq: Every day | ORAL | Status: DC
  Administered 2019-03-15: 50 mg via ORAL
  Filled 2019-03-14: qty 1

## 2019-03-14 MED ORDER — SODIUM CHLORIDE 0.9 % IV BOLUS
1000.0000 mL | Freq: Once | INTRAVENOUS | Status: AC
  Administered 2019-03-14: 1000 mL via INTRAVENOUS

## 2019-03-14 NOTE — Progress Notes (Signed)
Received report from [facility] in the ED.  Asked RN to verify with physician that patient doesn't need cardiac monitoring prior to transferring the patient up.  Awaiting patient's arrival to room 1604.

## 2019-03-14 NOTE — ED Notes (Signed)
Family at bedside. 

## 2019-03-14 NOTE — ED Triage Notes (Addendum)
Cancer pt. Patient c/o fatigue X3 days. Patient last had treatment Friday.   Patient states her head hurts and knees hurt.  Patient normally able to walk but can not walk today.   Patient reports hx. Of blood clot.   A/Ox4 Wheelchair in triage

## 2019-03-14 NOTE — ED Notes (Signed)
ED TO INPATIENT HANDOFF REPORT  Name/Age/Gender Charlynne Cousins Edling 78 y.o. female  Code Status    Code Status Orders  (From admission, onward)         Start     Ordered   03/14/19 1554  Do not attempt resuscitation (DNR)  Continuous    Question Answer Comment  In the event of cardiac or respiratory ARREST Do not call a "code blue"   In the event of cardiac or respiratory ARREST Do not perform Intubation, CPR, defibrillation or ACLS   In the event of cardiac or respiratory ARREST Use medication by any route, position, wound care, and other measures to relive pain and suffering. May use oxygen, suction and manual treatment of airway obstruction as needed for comfort.      03/14/19 1553        Code Status History    Date Active Date Inactive Code Status Order ID Comments User Context   02/02/2019 2230 02/04/2019 1600 Full Code [id number]  Florencia Reasons, MD Inpatient   Advance Care Planning Activity    Advance Directive Documentation     Most Recent Value  Type of Advance Directive  Living will  Pre-existing out of facility DNR order (yellow form or pink MOST form)  -  "MOST" Form in Place?  -      Home/SNF/Other Home  Chief Complaint cancer pt/ weakness /fatigue   Level of Care/Admitting Diagnosis ED Disposition    ED Disposition Condition [place]: [facility] [100102]  Level of Care: Med-Surg [16]  Covid Evaluation: Asymptomatic Screening Protocol (No Symptoms)  Diagnosis: Adverse effect of chemotherapy [id number]  Admitting Physician: Nita Sells [id number]  Attending Physician: Nita Sells [id number]  PT Class (Do Not Modify): Observation [104]  PT Acc Code (Do Not Modify): Observation [10022]       Medical History Past Medical History:  Diagnosis Date  . Back pain   . Family history of stomach cancer   . Family history of stomach cancer   . GERD (gastroesophageal reflux disease)   . Hypertension   . Pessary  maintenance   . Stroke [facility])     Allergies No Known Allergies  IV Location/Drains/Wounds Patient Lines/Drains/Airways Status   Active Line/Drains/Airways    Name:   Placement date:   Placement time:   Site:   Days:   Implanted Port 12/15/18 Right Chest   12/15/18    1110    Chest   89   External Urinary Catheter   02/02/19    2100    -   40          Labs/Imaging Results for orders placed or performed during the hospital encounter of 03/14/19 (from the past 48 hour(s))  CBG monitoring, ED     Status: Abnormal   Collection Time: 03/14/19  9:01 AM  Result Value Ref Range   Glucose-Capillary 222 (H) 70 - 99 mg/dL  CBC     Status: Abnormal   Collection Time: 03/14/19  9:26 AM  Result Value Ref Range   WBC 4.4 4.0 - 10.5 K/uL   RBC 3.11 (L) 3.87 - 5.11 MIL/uL   Hemoglobin 10.2 (L) 12.0 - 15.0 g/dL   HCT 30.4 (L) 36.0 - 46.0 %   MCV 97.7 80.0 - 100.0 fL   MCH 32.8 26.0 - 34.0 pg   MCHC 33.6 30.0 - 36.0 g/dL   RDW 17.8 (H) 11.5 - 15.5 %   Platelets 146 (L)  150 - 400 K/uL   nRBC 0.0 0.0 - 0.2 %    Comment: Performed at [facility], [place] [address], [place], [place] [address]  Comprehensive metabolic panel     Status: Abnormal   Collection Time: 03/14/19  9:26 AM  Result Value Ref Range   Sodium 134 (L) 135 - 145 mmol/L   Potassium 3.7 3.5 - 5.1 mmol/L   Chloride 97 (L) 98 - 111 mmol/L   CO2 25 22 - 32 mmol/L   Glucose, Bld 155 (H) 70 - 99 mg/dL   BUN 19 8 - 23 mg/dL   Creatinine, Ser 0.84 0.44 - 1.00 mg/dL   Calcium 9.8 8.9 - 10.3 mg/dL   Total Protein 6.8 6.5 - 8.1 g/dL   Albumin 3.6 3.5 - 5.0 g/dL   AST 24 15 - 41 U/L   ALT 29 0 - 44 U/L   Alkaline Phosphatase 64 38 - 126 U/L   Total Bilirubin 0.6 0.3 - 1.2 mg/dL   GFR calc non Af Amer >60 >60 mL/min   GFR calc Af Amer >60 >60 mL/min   Anion gap 12 5 - 15    Comment: Performed at [facility], [place] [address] [place], [place] [address]  Lactic acid, plasma     Status: None    Collection Time: 03/14/19  9:26 AM  Result Value Ref Range   Lactic Acid, Venous 1.8 0.5 - 1.9 mmol/L    Comment: Performed at [facility], [place] [address] [place], [place] [address]  SARS Coronavirus 2 [facility] order, Performed in [facility] hospital lab) Nasopharyngeal Nasopharyngeal Swab     Status: None   Collection Time: 03/14/19  9:29 AM   Specimen: Nasopharyngeal Swab  Result Value Ref Range   SARS Coronavirus 2 NEGATIVE NEGATIVE    Comment: (NOTE) If result is NEGATIVE SARS-CoV-2 target nucleic acids are NOT DETECTED. The SARS-CoV-2 RNA is generally detectable in upper and lower  respiratory specimens during the acute phase of infection. The lowest  concentration of SARS-CoV-2 viral copies this assay can detect is 250  copies / mL. A negative result does not preclude SARS-CoV-2 infection  and should not be used as the sole basis for treatment or other  patient management decisions.  A negative result may occur with  improper specimen collection / handling, submission of specimen other  than nasopharyngeal swab, presence of viral mutation(s) within the  areas targeted by this assay, and inadequate number of viral copies  (<250 copies / mL). A negative result must be combined with clinical  observations, patient history, and epidemiological information. If result is POSITIVE SARS-CoV-2 target nucleic acids are DETECTED. The SARS-CoV-2 RNA is generally detectable in upper and lower  respiratory specimens dur ing the acute phase of infection.  Positive  results are indicative of active infection with SARS-CoV-2.  Clinical  correlation with patient history and other diagnostic information is  necessary to determine patient infection status.  Positive results do  not rule out bacterial infection or co-infection with other viruses. If result is PRESUMPTIVE POSTIVE SARS-CoV-2 nucleic acids MAY BE PRESENT.   A presumptive positive result was obtained on the  submitted specimen  and confirmed on repeat testing.  While 2019 novel coronavirus  (SARS-CoV-2) nucleic acids may be present in the submitted sample  additional confirmatory testing may be necessary for epidemiological  and / or clinical management purposes  to differentiate between  SARS-CoV-2 and other Sarbecovirus currently known to  infect humans.  If clinically indicated additional testing with an alternate test  methodology [phone]) is advised. The SARS-CoV-2 RNA is generally  detectable in upper and lower respiratory sp ecimens during the acute  phase of infection. The expected result is Negative. Fact Sheet for Patients:  StrictlyIdeas.no Fact Sheet for Healthcare Providers: BankingDealers.co.za This test is not yet approved or cleared by the [place] FDA and has been authorized for detection and/or diagnosis of SARS-CoV-2 by FDA under an Emergency Use Authorization (EUA).  This EUA will remain in effect (meaning this test can be used) for the duration of the COVID-19 declaration under Section 564(b)(1) of the Act, 21 U.S.C. section 360bbb-3(b)(1), unless the authorization is terminated or revoked sooner. Performed at [facility], [place] [address] [place], [place] [address]   Urinalysis, Routine w reflex microscopic     Status: None   Collection Time: 03/14/19 10:37 AM  Result Value Ref Range   Color, Urine YELLOW YELLOW   APPearance CLEAR CLEAR   Specific Gravity, Urine 1.017 1.005 - 1.030   pH 7.0 5.0 - 8.0   Glucose, UA NEGATIVE NEGATIVE mg/dL   Hgb urine dipstick NEGATIVE NEGATIVE   Bilirubin Urine NEGATIVE NEGATIVE   Ketones, ur NEGATIVE NEGATIVE mg/dL   Protein, ur NEGATIVE NEGATIVE mg/dL   Nitrite NEGATIVE NEGATIVE   Leukocytes,Ua NEGATIVE NEGATIVE    Comment: Performed at [place] [address], [place], [place] [address]   Dg Chest 1 View  Result Date:  03/14/2019 CLINICAL DATA:  Chest heaviness for 2 days. EXAM: CHEST  1 VIEW COMPARISON:  Chest x-ray March 14, 2019 9 a.m. FINDINGS: Lateral view of the chest demonstrate clear lungs. Degenerative joint changes of the spine are identified. Central venous line is identified. IMPRESSION: No active disease. Electronically Signed   By: Abelardo Diesel M.D.   On: 03/14/2019 15:58   Ct Head Wo Contrast  Result Date: 03/14/2019 CLINICAL DATA:  Altered level of consciousness. EXAM: CT HEAD WITHOUT CONTRAST TECHNIQUE: Contiguous axial images were obtained from the base of the skull through the vertex without intravenous contrast. COMPARISON:  CT scan of April 09, 2013. FINDINGS: Brain: Mild chronic ischemic white matter disease is noted. No mass effect or midline shift is noted. Ventricular size is within normal limits. There is no evidence of mass lesion, hemorrhage or acute infarction. Vascular: No hyperdense vessel or unexpected calcification. Skull: Normal. Negative for fracture or focal lesion. Sinuses/Orbits: No acute finding. Other: None. IMPRESSION: Mild chronic ischemic white matter disease. No acute intracranial abnormality seen. Electronically Signed   By: Marijo Conception M.D.   On: 03/14/2019 12:59   Dg Chest Port 1 View  Result Date: 03/14/2019 CLINICAL DATA:  Shortness of breath. EXAM: PORTABLE CHEST 1 VIEW COMPARISON:  CT 02/02/2019. FINDINGS: PowerPort catheter noted with tip over cavoatrial junction. Heart size normal. No focal infiltrate. Mild left base subsegmental atelectasis. No pleural effusion or pneumothorax. IMPRESSION: 1.  PowerPort catheter with tip over cavoatrial junction. 2.  Mild left base subsegmental atelectasis. Electronically Signed   By: Marcello Moores  Register   On: 03/14/2019 09:25    Pending Labs Unresulted Labs (From admission, onward)    Start     Ordered   03/15/19 0500  Procalcitonin  Daily,   R     03/14/19 1528   03/14/19 1529  Procalcitonin - Baseline  ONCE - STAT,    STAT     03/14/19 1528   03/14/19 1529  Lactic acid,  plasma  STAT Now then every 3 hours,   R (with STAT occurrences)     03/14/19 1528   03/14/19 0844  Culture, blood (Routine X 2) w Reflex to ID Panel  BLOOD CULTURE X 2,   R (with STAT occurrences)     03/14/19 0844   Signed and Held  CBC  Tomorrow morning,   R     Signed and Held   Signed and Held  Comprehensive metabolic panel  Tomorrow morning,   R     Signed and Held          Vitals/Pain Today's Vitals   03/14/19 1400 03/14/19 1430 03/14/19 1445 03/14/19 1500  BP: 119/72 125/61  (!) 145/72  Pulse: 85 84 84 91  Resp: (!) 27 (!) 30 (!) 26 (!) 27  Temp:      TempSrc:      SpO2: 99% 99% 98% 99%  PainSc:        Isolation Precautions No active isolations  Medications Medications  0.9 %  sodium chloride infusion ( Intravenous New Bag/Given 03/14/19 1034)  acetaminophen (TYLENOL) tablet 650 mg (650 mg Oral Given 03/14/19 1606)  prochlorperazine (COMPAZINE) tablet 10 mg (has no administration in time range)  ceFEPIme (MAXIPIME) 2 g in sodium chloride 0.9 % 100 mL IVPB (2 g Intravenous New Bag/Given 03/14/19 1609)  sodium chloride 0.9 % bolus 1,000 mL (0 mLs Intravenous Stopped 03/14/19 1035)    Mobility walks with person assist

## 2019-03-14 NOTE — ED Notes (Addendum)
Transporter notified of need for pt transport to floor.

## 2019-03-14 NOTE — ED Provider Notes (Signed)
[place] DEPT Provider Note   CSN: [id number] Arrival date & time: 03/14/19  0758     History   Chief Complaint Chief Complaint  Patient presents with  . Cancer Pt  . Fatigue    HPI Paula Peterson is a 78 y.o. female.     78 year old female who presents with diffuse myalgias and mild headache.  Had chemotherapy 3 days ago and usually does feel worse but states that this is different.  Mild cough but no congestion.  No vomiting or diarrhea.  No anorexia.  Denies any photophobia.  No subjective fever at home but has had some myalgias as well as chills.  This is nonfocal and diffuse and to the point where she has trouble walking at this time.  No treatment used prior to arrival     Past Medical History:  Diagnosis Date  . Back pain   . Family history of stomach cancer   . Family history of stomach cancer   . GERD (gastroesophageal reflux disease)   . Hypertension   . Pessary maintenance   . Stroke [facility])     Patient Active Problem List   Diagnosis Date Noted  . Pancytopenia, acquired ([place]) 03/10/2019  . Peripheral neuropathy due to chemotherapy ([place]) 03/10/2019  . Epigastric pain 02/09/2019  . PE (pulmonary thromboembolism) ([place]) 02/02/2019  . Chronic hip pain 01/27/2019  . Acquired hypothyroidism 01/27/2019  . Family history of stomach cancer   . Goals of care, counseling/discussion 01/06/2019  . Elevated liver enzymes 01/06/2019  . Other fatigue 01/06/2019  . Other constipation 12/07/2018  . Ovarian cancer ([place]) 12/06/2018    Past Surgical History:  Procedure Laterality Date  . ABDOMINAL HYSTERECTOMY    . BACK SURGERY    . FRACTURE SURGERY    . IR IMAGING GUIDED PORT INSERTION  12/15/2018     OB History   No obstetric history on file.      Home Medications    Prior to Admission medications   Medication Sig Start Date End Date Taking? Authorizing Provider  amLODipine (NORVASC) 2.5 MG tablet Take 2.5 mg by mouth daily.     [provider]  apixaban (ELIQUIS) 5 MG TABS tablet Take 1 tablet (5 mg total) by mouth 2 (two) times daily. 03/02/19   [name], MD  dexamethasone (DECADRON) 4 MG tablet Take 2 tabs at the night before and 2 tab the morning of chemotherapy, every 3 weeks, by mouth with food 02/09/19   [name], MD  famotidine (PEPCID) 40 MG tablet Take 40 mg by mouth 2 (two) times daily.    [provider]  levothyroxine (SYNTHROID) 50 MCG tablet Take 1 tablet (50 mcg total) by mouth daily before breakfast. 03/10/19   [name], MD  lidocaine-prilocaine (EMLA) cream Apply to affected area once Patient taking differently: Apply 1 application topically as needed (port access).  12/07/18   [name], MD  losartan (COZAAR) 50 MG tablet Take 50 mg by mouth daily.    [provider]  ondansetron (ZOFRAN) 8 MG tablet Take 1 tablet (8 mg total) by mouth every 8 (eight) hours as needed for refractory nausea / vomiting. 12/07/18   [name], MD  polyethylene glycol (MIRALAX / GLYCOLAX) 17 g packet Take 17 g by mouth daily as needed for mild constipation.     [provider]  prochlorperazine (COMPAZINE) 10 MG tablet Take 1 tablet (10 mg total) by mouth every 6 (six) hours as needed (Nausea  or vomiting). 12/07/18   [name], MD  traMADol (ULTRAM) 50 MG tablet Take 1 tablet (50 mg total) by mouth every 6 (six) hours as needed. Patient taking differently: Take 50 mg by mouth every 6 (six) hours as needed for moderate pain.  01/27/19   [name], MD    Family History Family History  Problem Relation Age of Onset  . Stomach cancer Sister        dx 86s-50s  . Dementia Mother   . Cancer Sister        unk type, hysterectomy, dx 62s    Social History Social History   Tobacco Use  . Smoking status: Never Smoker  . Smokeless tobacco: Never Used  Substance Use Topics  . Alcohol use: No  . Drug use: No     Allergies   Patient has no known allergies.   Review of  Systems Review of Systems  All other systems reviewed and are negative.    Physical Exam Updated Vital Signs BP 134/73   Pulse 82   Temp (!) 97.4 F (36.3 C) (Oral)   Resp (!) 23   SpO2 100%   Physical Exam Vitals signs and nursing note reviewed.  Constitutional:      General: She is not in acute distress.    Appearance: Normal appearance. She is well-developed. She is not toxic-appearing.  HENT:     Head: Normocephalic and atraumatic.  Eyes:     General: Lids are normal.     Conjunctiva/sclera: Conjunctivae normal.     Pupils: Pupils are equal, round, and reactive to light.  Neck:     Musculoskeletal: Normal range of motion and neck supple.     Thyroid: No thyroid mass.     Trachea: No tracheal deviation.  Cardiovascular:     Rate and Rhythm: Normal rate and regular rhythm.     Heart sounds: Normal heart sounds. No murmur. No gallop.   Pulmonary:     Effort: Pulmonary effort is normal. No respiratory distress.     Breath sounds: Normal breath sounds. No stridor. No decreased breath sounds, wheezing, rhonchi or rales.  Abdominal:     General: Bowel sounds are normal. There is no distension.     Palpations: Abdomen is soft.     Tenderness: There is no abdominal tenderness. There is no guarding or rebound.  Musculoskeletal: Normal range of motion.        General: No tenderness.  Skin:    General: Skin is warm and dry.     Findings: No abrasion or rash.  Neurological:     Mental Status: She is alert and oriented to person, place, and time.     GCS: GCS eye subscore is 4. GCS verbal subscore is 5. GCS motor subscore is 6.     Cranial Nerves: No cranial nerve deficit.     Sensory: No sensory deficit.  Psychiatric:        Attention and Perception: Attention normal.        Mood and Affect: Affect is blunt.        Speech: Speech normal.        Behavior: Behavior normal.      ED Treatments / Results  Labs (all labs ordered are listed, but only abnormal results are  displayed) Labs Reviewed  SARS CORONAVIRUS 2 (HOSPITAL ORDER, [place] LAB)  CULTURE, BLOOD (ROUTINE X 2)  CULTURE, BLOOD (ROUTINE X 2)  CBC  URINALYSIS, ROUTINE W REFLEX  MICROSCOPIC  COMPREHENSIVE METABOLIC PANEL  LACTIC ACID, PLASMA  CBG MONITORING, ED    EKG EKG Interpretation  Date/Time:  Tuesday March 14 2019 09:02:16 EDT Ventricular Rate:  85 PR Interval:    QRS Duration: 85 QT Interval:  374 QTC Calculation: 445 R Axis:   -31 Text Interpretation:  Sinus rhythm Left axis deviation Low voltage, precordial leads Confirmed by Lacretia Leigh ([id number]) on 03/14/2019 1:03:18 PM   Radiology No results found.  Procedures Procedures (including critical care time)  Medications Ordered in ED Medications  sodium chloride flush (NS) 0.9 % injection 3 mL (has no administration in time range)     Initial Impression / Assessment and Plan / ED Course  I have reviewed the triage vital signs and the nursing notes.  Pertinent labs & imaging results that were available during my care of the patient were reviewed by me and considered in my medical decision making (see chart for details).        Patient presented with weakness today.  Given IV fluids with really no improvement.  Notes diffuse myalgias.  Urinalysis, COVID tests are normal.  CBC without evidence of leukocytosis or leukopenia.  Patient had complained of a headache and had a negative head CT.  X-rays negative.  Due to patient's persistent weakness will consult hospitalist for admission Final Clinical Impressions(s) / ED Diagnoses   Final diagnoses:  None    ED Discharge Orders    None       Lacretia Leigh, MD 03/14/19 1349

## 2019-03-14 NOTE — Progress Notes (Signed)
PHARMACY NOTE -  Antibiotic dosing  Pharmacy has been assisting with dosing of Cefepime for pneumonia.  Dosage remains stable at 2g IV q8 hr and need for further dosage adjustment appears unlikely at present given renal function at baseline   Pharmacy will sign off, following peripherally for culture results or dose adjustments. Please reconsult if a change in clinical status warrants re-evaluation of dosage.  Reuel Boom, PharmD, BCPS [phone] 03/14/2019, 3:47 PM

## 2019-03-14 NOTE — H&P (Addendum)
HPI  Paula Peterson [id number] DOB: 01-09-1941 DOA: 03/14/2019  PCP: Caryl Bis, MD   Chief Complaint: Paula Peterson  HPI:  61 white female stage IV ovarian cancer diagnosed April 2000 2013 cm complex cyst first tumor marker Ca1 20 [id number] last one recently done 87-right Port-A-Cath-on reduced dose of paclitaxel most recently given on 9/11 Developed 8/6 bilateral pulmonary embolisms right heart strain + DVT right femoral popliteal peroneal soleal veins on Eliquis prior right hydroureter at that admission  Presents with 2 to 3 days of feeling poor after chemo yesterday 9/14 spent the entire day in bed has had "chest heaviness with something "laying on it" and severe intractable headaches that have worsened since today-was only able to take bites of toast this morning usually eats well this was her fifth chemo She tells me that the main issue is the headaches and the weakness She needed multiple people to get her from the bedside to CT table when she came to triage therefore it was requested that she be admitted  She is pretty weak overall compared to prior and has never had these issues with prior chemo cycles  Review of Systems:  As above  Pertinent +'s: Pertinent -"s: No dark stool no tarry stool no diarrhea no chills no Reiger's It appears that she did have a low-grade temp 97 in the emergency room   ED Course: Patient was given saline 125 cc/h as a rate and a bolus of fluid kept on a regular diet    Past Medical History:  Diagnosis Date  . Back pain   . Family history of stomach cancer   . Family history of stomach cancer   . GERD (gastroesophageal reflux disease)   . Hypertension   . Pessary maintenance   . Stroke [facility])    Past Surgical History:  Procedure Laterality Date  . ABDOMINAL HYSTERECTOMY    . BACK SURGERY    . FRACTURE SURGERY    . IR IMAGING GUIDED PORT INSERTION  12/15/2018    reports that she has never smoked. She has never used smokeless tobacco. She  reports that she does not drink alcohol or use drugs.  Mobility: Independent at baseline  No Known Allergies Family History  Problem Relation Age of Onset  . Stomach cancer Sister        dx 49s-50s  . Dementia Mother   . Cancer Sister        unk type, hysterectomy, dx 56s   Sister had cancer of the stomach Mother died at age 28 Father killed himself when she was 20 1 sister has CHF   Prior to Admission medications   Medication Sig Start Date End Date Taking? Authorizing Provider  acetaminophen (TYLENOL) 325 MG tablet Take 650 mg by mouth every 6 (six) hours as needed for mild pain or headache.   Yes [provider]  amLODipine (NORVASC) 2.5 MG tablet Take 2.5 mg by mouth daily.   Yes [provider]  apixaban (ELIQUIS) 5 MG TABS tablet Take 1 tablet (5 mg total) by mouth 2 (two) times daily. 03/02/19  Yes Gorsuch, Ni, MD  dexamethasone (DECADRON) 4 MG tablet Take 2 tabs at the night before and 2 tab the morning of chemotherapy, every 3 weeks, by mouth with food Patient taking differently: Take 8 mg by mouth as directed. Take 8mg  at the night before and 8mg  the morning of chemotherapy, every 3 weeks, by mouth with food 02/09/19  Yes [name], MD  famotidine (  PEPCID) 40 MG tablet Take 40 mg by mouth 2 (two) times daily.   Yes [provider]  levothyroxine (SYNTHROID) 50 MCG tablet Take 1 tablet (50 mcg total) by mouth daily before breakfast. 03/10/19  Yes Gorsuch, Ni, MD  lidocaine-prilocaine (EMLA) cream Apply to affected area once Patient taking differently: Apply 1 application topically as needed (port access).  12/07/18  Yes Gorsuch, Ni, MD  losartan (COZAAR) 50 MG tablet Take 50 mg by mouth daily.   Yes [provider]  ondansetron (ZOFRAN) 8 MG tablet Take 1 tablet (8 mg total) by mouth every 8 (eight) hours as needed for refractory nausea / vomiting. 12/07/18  Yes Gorsuch, Ni, MD  polyethylene glycol (MIRALAX / GLYCOLAX) 17 g packet Take 17 g by  mouth daily.    Yes [provider]  prochlorperazine (COMPAZINE) 10 MG tablet Take 1 tablet (10 mg total) by mouth every 6 (six) hours as needed (Nausea or vomiting). 12/07/18  Yes Gorsuch, Ni, MD  traMADol (ULTRAM) 50 MG tablet Take 1 tablet (50 mg total) by mouth every 6 (six) hours as needed. Patient not taking: Reported on 03/14/2019 01/27/19   [name], MD    Physical Exam:  Vitals:   03/14/19 1330 03/14/19 1400  BP: 123/62 119/72  Pulse: 87 85  Resp: 18 (!) 27  Temp:    SpO2: 99% 99%     Awake coherent pleasant no distress bald head port in right chest  S1-S2 no murmur rub or gallop  Abdomen slightly tender left lower quadrant-chest is clinically clear with no added sound  Moderate dentition but does have upper dentures  Cannot appreciate any thrush  External ocular movements are grossly intact  Neck soft supple  No lower extremity pitting edema  Has a pure wick  Psych euthymic but does seem a little bit fatigued  I have personally reviewed following labs and imaging studies  Labs:   BUN/creatinine 19/0.8 bicarb 25  Lactic acid 1.8 anion gap 12  Hemoglobin 10.2 platelet 146 usual baseline anywhere from 1 20-1 90  White count 4.4  Imaging studies:   Chest x-ray PowerPort noted, mild left subsegmental atelectasis  CT head chronic ischemic white matter disease no intracranial abnormality  Medical tests:   EKG independently reviewed: Sinus rhythm rate of about 70 PR interval 0.12 QRS axis -25 LAFB compared to prior EKG 02/02/2019 there is no significant change    Test discussed with performing physician:  Yes discussed with Dr. Zenia Resides  Decision to obtain old records:   Yes  Review and summation of old records:   Yes  Active Problems:   * No active hospital problems. *   Assessment/Plan Malaise secondary to chemotherapy She had a slightly low temperature and she does have left subsegmental atelectasis I will get a dedicated 2  view chest x-ray as I feel she may have a subclinical pneumonia which would go in keeping with her chest pain which is on the left side I do not think her chest pain is cardiogenic Cycle lactic acid, get procalcitonin-we will empirically start on cefepime and if procalcitonin and chest x-ray are negative we will discontinue Likely noncardiac chest pain-more likely secondary to her chemo? Her features are not typical anginal features Troponin has not been done but EKG is normal we will cycle troponin x3 and follow A.m. EKG to be done to see if any early changes Stage IV ovarian cancer on reduced dose paclitaxel under care of Dr. Simeon Craft such Patient will  need further management from oncologist in the outpatient setting Would recommend that the patient have discussions with oncologist probably after hospitalization-continue Decadron 8 daily if x-ray turns out to be positive or if she spikes a fever may need to increase dose to stress dosing Can continue Compazine 10 every 6 as needed nausea Massive prior bilateral PEs, DVT Keep on anticoagulation at this time-monitor-no risk at this time in my mind for recurrent VTE HTN  Tab continue Cozaar 50, amlodipine 5 Hypothyroid Continue levothyroxine 50 daily check TSH in 3 to 4 weeks  Severity of Illness: The appropriate patient status for this patient is OBSERVATION. Observation status is judged to be reasonable and necessary in order to provide the required intensity of service to ensure the patient's safety. The patient's presenting symptoms, physical exam findings, and initial radiographic and laboratory data in the context of their medical condition is felt to place them at decreased risk for further clinical deterioration. Furthermore, it is anticipated that the patient will be medically stable for discharge from the hospital within 2 midnights of admission. The following factors support the patient status of observation.   " The patient's presenting  symptoms include malaise. " The physical exam findings include reassuring. " The initial radiographic and laboratory data are unremarkable thus far I would de-escalate antibiotics if the patient does not seem to require them or procalcitonin and lateral view chest x-ray is [-].   https://[email or web address].info/ [email or web address] VoipGurus.fr https://www.mdcalc.com/heart-score-major-cardiac-events] http://www.[email or web address].com/  DVT prophylaxis: Shelda Altes Code Status: DNR confirmed at bedside Family Communication: D/W daughter Consults called: None I copied however her oncologist on this note  Time spent: 48 minutes  Verlon Au, MD Jerl Mina my NP partners at night for Care related issues] [facility] --[facility] [employer] [facility] , [email or web address]; password [facility]  03/14/2019, 3:11 PM

## 2019-03-14 NOTE — ED Notes (Signed)
Pt required two person assistance from wheelchair into bed, pt barely able to bear own weight when transitioning from chair to bed.  Both bed siderails raised for pt safety.  Call light in reach. Will continue to monitor.

## 2019-03-14 NOTE — Progress Notes (Signed)
CRITICAL VALUE ALERT   Critical Value:Lactic acid 2.8  Date & Time Notied:9/15/200  Provider Notified:[id number]  Orders Received/Actions taken: Dr Charlynn Court

## 2019-03-15 DIAGNOSIS — Z8673 Personal history of transient ischemic attack (TIA), and cerebral infarction without residual deficits: Secondary | ICD-10-CM | POA: Diagnosis not present

## 2019-03-15 DIAGNOSIS — I959 Hypotension, unspecified: Secondary | ICD-10-CM | POA: Diagnosis present

## 2019-03-15 DIAGNOSIS — Z79899 Other long term (current) drug therapy: Secondary | ICD-10-CM | POA: Diagnosis not present

## 2019-03-15 DIAGNOSIS — R402362 Coma scale, best motor response, obeys commands, at arrival to emergency department: Secondary | ICD-10-CM | POA: Diagnosis present

## 2019-03-15 DIAGNOSIS — C569 Malignant neoplasm of unspecified ovary: Secondary | ICD-10-CM | POA: Diagnosis present

## 2019-03-15 DIAGNOSIS — I2782 Chronic pulmonary embolism: Secondary | ICD-10-CM | POA: Diagnosis not present

## 2019-03-15 DIAGNOSIS — R5381 Other malaise: Secondary | ICD-10-CM | POA: Diagnosis present

## 2019-03-15 DIAGNOSIS — Z800 Family history of malignant neoplasm of digestive organs: Secondary | ICD-10-CM | POA: Diagnosis not present

## 2019-03-15 DIAGNOSIS — T451X5A Adverse effect of antineoplastic and immunosuppressive drugs, initial encounter: Secondary | ICD-10-CM

## 2019-03-15 DIAGNOSIS — I82411 Acute embolism and thrombosis of right femoral vein: Secondary | ICD-10-CM | POA: Diagnosis not present

## 2019-03-15 DIAGNOSIS — Z7901 Long term (current) use of anticoagulants: Secondary | ICD-10-CM | POA: Diagnosis not present

## 2019-03-15 DIAGNOSIS — I2699 Other pulmonary embolism without acute cor pulmonale: Secondary | ICD-10-CM | POA: Diagnosis not present

## 2019-03-15 DIAGNOSIS — I10 Essential (primary) hypertension: Secondary | ICD-10-CM | POA: Diagnosis present

## 2019-03-15 DIAGNOSIS — R531 Weakness: Secondary | ICD-10-CM | POA: Diagnosis not present

## 2019-03-15 DIAGNOSIS — Z8249 Family history of ischemic heart disease and other diseases of the circulatory system: Secondary | ICD-10-CM | POA: Diagnosis not present

## 2019-03-15 DIAGNOSIS — R51 Headache: Secondary | ICD-10-CM | POA: Diagnosis present

## 2019-03-15 DIAGNOSIS — I9589 Other hypotension: Secondary | ICD-10-CM | POA: Diagnosis not present

## 2019-03-15 DIAGNOSIS — K219 Gastro-esophageal reflux disease without esophagitis: Secondary | ICD-10-CM | POA: Diagnosis present

## 2019-03-15 DIAGNOSIS — Z66 Do not resuscitate: Secondary | ICD-10-CM | POA: Diagnosis present

## 2019-03-15 DIAGNOSIS — I82461 Acute embolism and thrombosis of right calf muscular vein: Secondary | ICD-10-CM | POA: Diagnosis present

## 2019-03-15 DIAGNOSIS — Z79890 Hormone replacement therapy (postmenopausal): Secondary | ICD-10-CM | POA: Diagnosis not present

## 2019-03-15 DIAGNOSIS — I2609 Other pulmonary embolism with acute cor pulmonale: Secondary | ICD-10-CM

## 2019-03-15 DIAGNOSIS — J9811 Atelectasis: Secondary | ICD-10-CM | POA: Diagnosis present

## 2019-03-15 DIAGNOSIS — I82451 Acute embolism and thrombosis of right peroneal vein: Secondary | ICD-10-CM | POA: Diagnosis present

## 2019-03-15 DIAGNOSIS — E039 Hypothyroidism, unspecified: Secondary | ICD-10-CM | POA: Diagnosis present

## 2019-03-15 DIAGNOSIS — I82431 Acute embolism and thrombosis of right popliteal vein: Secondary | ICD-10-CM | POA: Diagnosis present

## 2019-03-15 DIAGNOSIS — E861 Hypovolemia: Secondary | ICD-10-CM

## 2019-03-15 DIAGNOSIS — Z20828 Contact with and (suspected) exposure to other viral communicable diseases: Secondary | ICD-10-CM | POA: Diagnosis present

## 2019-03-15 DIAGNOSIS — R402142 Coma scale, eyes open, spontaneous, at arrival to emergency department: Secondary | ICD-10-CM | POA: Diagnosis present

## 2019-03-15 DIAGNOSIS — I952 Hypotension due to drugs: Secondary | ICD-10-CM | POA: Diagnosis not present

## 2019-03-15 LAB — CBC
HCT: 28.6 % — ABNORMAL LOW (ref 36.0–46.0)
Hemoglobin: 9.3 g/dL — ABNORMAL LOW (ref 12.0–15.0)
MCH: 32.6 pg (ref 26.0–34.0)
MCHC: 32.5 g/dL (ref 30.0–36.0)
MCV: 100.4 fL — ABNORMAL HIGH (ref 80.0–100.0)
Platelets: 125 10*3/uL — ABNORMAL LOW (ref 150–400)
RBC: 2.85 MIL/uL — ABNORMAL LOW (ref 3.87–5.11)
RDW: 17.8 % — ABNORMAL HIGH (ref 11.5–15.5)
WBC: 3.9 10*3/uL — ABNORMAL LOW (ref 4.0–10.5)
nRBC: 0 % (ref 0.0–0.2)

## 2019-03-15 LAB — COMPREHENSIVE METABOLIC PANEL
ALT: 24 U/L (ref 0–44)
AST: 21 U/L (ref 15–41)
Albumin: 3.2 g/dL — ABNORMAL LOW (ref 3.5–5.0)
Alkaline Phosphatase: 57 U/L (ref 38–126)
Anion gap: 8 (ref 5–15)
BUN: 18 mg/dL (ref 8–23)
CO2: 23 mmol/L (ref 22–32)
Calcium: 9.4 mg/dL (ref 8.9–10.3)
Chloride: 102 mmol/L (ref 98–111)
Creatinine, Ser: 0.78 mg/dL (ref 0.44–1.00)
GFR calc Af Amer: >60 mL/min (ref >60)
GFR calc non Af Amer: >60 mL/min (ref >60)
Glucose, Bld: 109 mg/dL — ABNORMAL HIGH (ref 70–99)
Potassium: 4.3 mmol/L (ref 3.5–5.1)
Sodium: 133 mmol/L — ABNORMAL LOW (ref 135–145)
Total Bilirubin: 0.7 mg/dL (ref 0.3–1.2)
Total Protein: 5.9 g/dL — ABNORMAL LOW (ref 6.5–8.1)

## 2019-03-15 LAB — PROCALCITONIN: Procalcitonin: <0.1 ng/mL

## 2019-03-15 LAB — TSH: TSH: 3.455 u[IU]/mL (ref 0.350–4.500)

## 2019-03-15 LAB — T4, FREE: Free T4: 1.16 ng/dL — ABNORMAL HIGH (ref 0.61–1.12)

## 2019-03-15 MED FILL — LEVOTHYROXINE 50 MCG TABLET: 50 | 30 days supply | Qty: 30 | Fill #0

## 2019-03-15 NOTE — Evaluation (Signed)
Occupational Therapy Evaluation Patient Details Name: Paula Peterson MRN: [id number] DOB: 11-Dec-1940 Today's Date: 03/15/2019    History of Present Illness 78 year old female admitted for general weakness after chemo.  PMH:  ovarian CA, back pain and CVA   Clinical Impression   Pt was admitted for the above. At baseline, she is mod I (furniture walking) vs set up for basic adls.  She states that normally her 4th day after chemo is her down day and she often spends the day in bed. Her husband is home and will do whatever she needs. Pt needs more A for LB adls at this time and min guard walking to bathroom.  She does not want any DME.  Will follow in acute setting with supervision level goals.      Follow Up Recommendations  Supervision/Assistance - 24 hour(HHOT if agreeable)    Equipment Recommendations  (pt doesn't want bathroom DME)    Recommendations for Other Services       Precautions / Restrictions Precautions Precautions: Fall Restrictions Weight Bearing Restrictions: No      Mobility Bed Mobility Overal bed mobility: Modified Independent             General bed mobility comments: HOB up 20  Transfers Overall transfer level: Needs assistance Equipment used: None Transfers: Sit to/from Stand Sit to Stand: Min guard         General transfer comment: for safety    Balance Overall balance assessment: Mild deficits observed, not formally tested(pt furniture walks at baseline)                                         ADL either performed or assessed with clinical judgement   ADL Overall ADL's : Needs assistance/impaired Eating/Feeding: Independent   Grooming: Set up                   Toilet Transfer: Minimal assistance   Toileting- Clothing Manipulation and Hygiene: Min guard;Sit to/from stand         General ADL Comments: pt needs set up for UB adls and mod/max for LB adls due to fatique.  Pt's bathroom is a little further  than ours. She does not want DME for it. Doesn't have seat in shower, but states it is small and will just shower when she has the energy.  Started Dispensing optician      Pertinent Vitals/Pain Pain Assessment: Faces Faces Pain Scale: Hurts a little bit Pain Location: back Pain Descriptors / Indicators: Discomfort Pain Intervention(s): Limited activity within patient's tolerance;Monitored during session;Repositioned     Hand Dominance     Extremity/Trunk Assessment Upper Extremity Assessment Upper Extremity Assessment: Generalized weakness(shoulder pain)           Communication Communication Communication: No difficulties   Cognition Arousal/Alertness: Awake/alert Behavior During Therapy: WFL for tasks assessed/performed Overall Cognitive Status: Within Functional Limits for tasks assessed                                     General Comments  up up to 143 after wa,lking to bathroom; sats 99% but dyspnea 2/4 on RA after trip to bathroom    Exercises  Shoulder Instructions      Home Living Family/patient expects to be discharged to:: Private residence Living Arrangements: Spouse/significant other Available Help at Discharge: Family               Bathroom Shower/Tub: Walk-in Corporate treasurer Toilet: Handicapped height     Home Equipment: None          Prior Functioning/Environment Level of Independence: (mod I, furniture walks)        Comments: husband helps with IADLs; will do anything she needs        OT Problem List: Decreased strength;Decreased activity tolerance;Impaired balance (sitting and/or standing);Decreased knowledge of use of DME or AE;Pain;Cardiopulmonary status limiting activity      OT Treatment/Interventions: Self-care/ADL training;Energy conservation;DME and/or AE instruction;Balance training;Patient/family education;Therapeutic activities    OT  Goals(Current goals can be found in the care plan section) Acute Rehab OT Goals Patient Stated Goal: get strength back OT Goal Formulation: With patient Time For Goal Achievement: 03/29/19 Potential to Achieve Goals: Good ADL Goals Pt Will Transfer to Toilet: with supervision;ambulating Pt Will Perform Toileting - Clothing Manipulation and hygiene: with supervision;sit to/from stand Pt Will Perform Tub/Shower Transfer: Shower transfer;with supervision Additional ADL Goal #1: pt will complete LB adls with set up supervision Additional ADL Goal #2: pt will verbalize 3 energy conservation strategies  OT Frequency: Min 2X/week   Barriers to D/C:            Co-evaluation              AM-PAC OT "6 Clicks" Daily Activity     Outcome Measure Help from another person eating meals?: None Help from another person taking care of personal grooming?: A Little Help from another person toileting, which includes using toliet, bedpan, or urinal?: A Little Help from another person bathing (including washing, rinsing, drying)?: A Lot Help from another person to put on and taking off regular upper body clothing?: A Little Help from another person to put on and taking off regular lower body clothing?: A Lot 6 Click Score: 17   End of Session Nurse Communication: Mobility status  Activity Tolerance: Patient limited by fatigue Patient left: in chair;with call bell/phone within reach  OT Visit Diagnosis: Unsteadiness on feet (R26.81);Muscle weakness (generalized) (M62.81)                Time: [id number] OT Time Calculation (min): 25 min Charges:  OT General Charges $OT Visit: 1 Visit OT Evaluation $OT Eval Low Complexity: 1 Low OT Treatments $Self Care/Home Management : 8-22 mins  Lesle Chris, OTR/L Acute Rehabilitation Services [phone] [facility] pager [phone] office 03/15/2019  [place] 03/15/2019, 9:14 AM

## 2019-03-15 NOTE — Progress Notes (Addendum)
PROGRESS NOTE    Paula Peterson  [id number] DOB: [birth year] DOA: 03/14/2019 PCP: Caryl Bis, MD    Brief Narrative:   Paula Peterson is a 78 year old Caucasian female with past medical history remarkable for stage IV ovarian cancer diagnosed April 2000 currently on reduced dose of paclitaxel, bilateral pulmonary embolisms with associated right heart strain and DVT right femoral popliteal peroneal soleal veins on Eliquis presented to the ED with 2 to 3 days of feeling poor after chemo on 9/14 spent the entire day in bed has had "chest heaviness" with something "laying on it" and severe intractable headaches that have worsened since today-was only able to take bites of toast this morning usually eats well this was her fifth chemo. She tells me that the main issue is the headaches and the weakness.  She needed multiple people to get her from the bedside to CT table when she came to triage therefore it was requested that she be admitted. She is pretty weak overall compared to prior and has never had these issues with prior chemo cycles   Assessment & Plan:   Active Problems:   Adverse effect of chemotherapy   Hypotension  Hypotension Generalized malaise, debility, weakness and fatigue Patient presenting to the ED with generalized malaise and weakness.  Patient currently on chemotherapy outpatient for underlying stage IV ovarian cancer.  Patient has had poor oral intake during this timeframe and was noted to be hypotensive.  Initially concern for possible underlying pneumonia, chest x-ray was not negative with reduction of lactic acid from 2.8-1.4 with IV fluid hydration.  Procalcitonin was negative x2.  Cefepime has been discontinued. --Continue IV fluid hydration with NS at 111mL/hr --Discontinue home losartan and amlodipine --Follow-up blood cultures x2, unlikely infectious etiology --PT/OT recommends home health, DME order placed --Can continue supportive care  Atypical chest  pain Patient reports some chest heaviness that was nonspecific on initial presentation.  EKG notable for NSR without any concerning ST elevation/depression or T wave inversions, normal intervals.  High-sensitivity troponin was trended and remained flat at 5 and 5.  Patient denies any further symptoms.  Essential hypertension Patient on losartan 50 mg p.o. daily and amlodipine 2.5 mg p.o. daily.  Patient with lethargy, decreased oral intake and hypotension. --Discontinue home losartan and amlodipine --Monitor BP closely --IV fluid hydration as above  Hypothyroidism --Check TSH, FT3/T4 --Continue levothyroxine 50 mcg p.o. daily  Bilateral PE/DVT Currently oxygenating well on room air. --Continue Eliquis 5 mg p.o. twice daily    DVT prophylaxis: Eliquis Code Status: DNR Family Communication: none Disposition Plan: Anticipate discharge home in 1-2 days with home health   Consultants:   None  Procedures:   None  Antimicrobials:   Cefepime 9/15 - 9/16   Subjective: Patient seen and examined at bedside, resting comfortably.  States starting to feel slightly better in regards to her weakness, dizziness with IV fluid hydration.  States that she does experiences these episodes following chemotherapy, but not as quickly as this timeframe.  Has been seen by PT/OT with recommendations of home health.  No other complaints at this time.  Denies headache, no visual changes, no chest pain, no palpitations, no shortness of breath, no abdominal pain, no cough/congestion, no nausea/vomiting/diarrhea, no fever/chills/night sweats, no paresthesias.  No acute events overnight per nursing staff.  Objective: Vitals:   03/14/19 1913 03/14/19 2034 03/15/19 0548 03/15/19 1341  BP:  104/64 98/65 98/64   Pulse:  91 80 85  Resp:  17 17 16  Temp:  99.1 F (37.3 C) 98.7 F (37.1 C) 98 F (36.7 C)  TempSrc:  Oral Oral Oral  SpO2:  96% 94% 99%  Height: 5\' 7"  (1.702 m)       Intake/Output Summary  (Last 24 hours) at 03/15/2019 1503 Last data filed at 03/15/2019 1245 Gross per 24 hour  Intake 1760 ml  Output 1300 ml  Net 460 ml   There were no vitals filed for this visit.  Examination:  General exam: Appears calm and comfortable  Respiratory system: Clear to auscultation. Respiratory effort normal.  Oxygenating well on room air Cardiovascular system: S1 & S2 heard, RRR. No JVD, murmurs, rubs, gallops or clicks. No pedal edema.,  Port noted and currently accessed Gastrointestinal system: Abdomen is nondistended, soft and nontender. No organomegaly or masses felt. Normal bowel sounds heard. Central nervous system: Alert and oriented. No focal neurological deficits. Extremities: Symmetric 5 x 5 power. Skin: No rashes, lesions or ulcers Psychiatry: Judgement and insight appear normal. Mood & affect appropriate.     Data Reviewed: I have personally reviewed following labs and imaging studies  CBC: Recent Labs  Lab 03/10/19 0925 03/14/19 0926 03/15/19 0500  WBC 3.7* 4.4 3.9*  NEUTROABS 2.8  --   --   HGB 11.1* 10.2* 9.3*  HCT 33.0* 30.4* 28.6*  MCV 94.8 97.7 100.4*  PLT 193 146* 0000000*   Basic Metabolic Panel: Recent Labs  Lab 03/10/19 0925 03/14/19 0926 03/15/19 0500  NA 137 134* 133*  K 4.1 3.7 4.3  CL 103 97* 102  CO2 24 25 23   GLUCOSE 189* 155* 109*  BUN 17 19 18   CREATININE 1.01* 0.84 0.78  CALCIUM 10.1 9.8 9.4   GFR: Estimated Creatinine Clearance: 72.6 mL/min (by C-G formula based on SCr of 0.78 mg/dL). Liver Function Tests: Recent Labs  Lab 03/10/19 0925 03/14/19 0926 03/15/19 0500  AST 22 24 21   ALT 31 29 24   ALKPHOS 100 64 57  BILITOT 0.3 0.6 0.7  PROT 7.6 6.8 5.9*  ALBUMIN 3.8 3.6 3.2*   No results for input(s): LIPASE, AMYLASE in the last 168 hours. No results for input(s): AMMONIA in the last 168 hours. Coagulation Profile: No results for input(s): INR, PROTIME in the last 168 hours. Cardiac Enzymes: No results for input(s): CKTOTAL,  CKMB, CKMBINDEX, TROPONINI in the last 168 hours. BNP (last 3 results) No results for input(s): PROBNP in the last 8760 hours. HbA1C: No results for input(s): HGBA1C in the last 72 hours. CBG: Recent Labs  Lab 03/14/19 0901  GLUCAP 222*   Lipid Profile: No results for input(s): CHOL, HDL, LDLCALC, TRIG, CHOLHDL, LDLDIRECT in the last 72 hours. Thyroid Function Tests: No results for input(s): TSH, T4TOTAL, FREET4, T3FREE, THYROIDAB in the last 72 hours. Anemia Panel: No results for input(s): VITAMINB12, FOLATE, FERRITIN, TIBC, IRON, RETICCTPCT in the last 72 hours. Sepsis Labs: Recent Labs  Lab 03/14/19 0926 03/14/19 1551 03/14/19 1858 03/14/19 2200 03/15/19 0500  PROCALCITON  --  <0.10  --   --  <0.10  LATICACIDVEN 1.8 2.8* 1.9 1.4  --     Recent Results (from the past 240 hour(s))  Culture, blood (Routine X 2) w Reflex to ID Panel     Status: None (Preliminary result)   Collection Time: 03/14/19  9:26 AM   Specimen: BLOOD  Result Value Ref Range Status   Specimen Description   Final    BLOOD PORTA CATH Performed at [facility], [address] [place],  [place] [address]    Special Requests   Final    BOTTLES DRAWN AEROBIC AND ANAEROBIC Blood Culture adequate volume Performed at [facility] [address], [place], [place] [address]    Culture   Final    NO GROWTH < 24 HOURS Performed at [facility] [address], [place], [place] [address]    Report Status PENDING  Incomplete  SARS Coronavirus 2 [facility] order, Performed in [facility] hospital lab) Nasopharyngeal Nasopharyngeal Swab     Status: None   Collection Time: 03/14/19  9:29 AM   Specimen: Nasopharyngeal Swab  Result Value Ref Range Status   SARS Coronavirus 2 NEGATIVE NEGATIVE Final    Comment: (NOTE) If result is NEGATIVE SARS-CoV-2 target nucleic acids are NOT DETECTED. The SARS-CoV-2 RNA is generally detectable in upper and lower  respiratory  specimens during the acute phase of infection. The lowest  concentration of SARS-CoV-2 viral copies this assay can detect is 250  copies / mL. A negative result does not preclude SARS-CoV-2 infection  and should not be used as the sole basis for treatment or other  patient management decisions.  A negative result may occur with  improper specimen collection / handling, submission of specimen other  than nasopharyngeal swab, presence of viral mutation(s) within the  areas targeted by this assay, and inadequate number of viral copies  (<250 copies / mL). A negative result must be combined with clinical  observations, patient history, and epidemiological information. If result is POSITIVE SARS-CoV-2 target nucleic acids are DETECTED. The SARS-CoV-2 RNA is generally detectable in upper and lower  respiratory specimens dur ing the acute phase of infection.  Positive  results are indicative of active infection with SARS-CoV-2.  Clinical  correlation with patient history and other diagnostic information is  necessary to determine patient infection status.  Positive results do  not rule out bacterial infection or co-infection with other viruses. If result is PRESUMPTIVE POSTIVE SARS-CoV-2 nucleic acids MAY BE PRESENT.   A presumptive positive result was obtained on the submitted specimen  and confirmed on repeat testing.  While 2019 novel coronavirus  (SARS-CoV-2) nucleic acids may be present in the submitted sample  additional confirmatory testing may be necessary for epidemiological  and / or clinical management purposes  to differentiate between  SARS-CoV-2 and other Sarbecovirus currently known to infect humans.  If clinically indicated additional testing with an alternate test  methodology [phone]) is advised. The SARS-CoV-2 RNA is generally  detectable in upper and lower respiratory sp ecimens during the acute  phase of infection. The expected result is Negative. Fact Sheet for  Patients:  StrictlyIdeas.no Fact Sheet for Healthcare Providers: BankingDealers.co.za This test is not yet approved or cleared by the [place] FDA and has been authorized for detection and/or diagnosis of SARS-CoV-2 by FDA under an Emergency Use Authorization (EUA).  This EUA will remain in effect (meaning this test can be used) for the duration of the COVID-19 declaration under Section 564(b)(1) of the Act, 21 U.S.C. section 360bbb-3(b)(1), unless the authorization is terminated or revoked sooner. Performed at [facility], [place] [address], [place], [place] [address]   Culture, blood (Routine X 2) w Reflex to ID Panel     Status: None (Preliminary result)   Collection Time: 03/14/19 10:37 AM   Specimen: BLOOD  Result Value Ref Range Status   Specimen Description   Final    BLOOD RIGHT WRIST Performed at [facility],  [place] [address] [place], [place] [address]    Special Requests   Final    BOTTLES DRAWN AEROBIC AND ANAEROBIC Blood Culture adequate volume Performed at [facility] [address] [place], [place] [address]    Culture   Final    NO GROWTH < 24 HOURS Performed at [facility] [address] [place], [place] [address]    Report Status PENDING  Incomplete         Radiology Studies: Dg Chest 1 View  Result Date: 03/14/2019 CLINICAL DATA:  Chest heaviness for 2 days. EXAM: CHEST  1 VIEW COMPARISON:  Chest x-ray March 14, 2019 9 a.m. FINDINGS: Lateral view of the chest demonstrate clear lungs. Degenerative joint changes of the spine are identified. Central venous line is identified. IMPRESSION: No active disease. Electronically Signed   By: Abelardo Diesel M.D.   On: 03/14/2019 15:58   Ct Head Wo Contrast  Result Date: 03/14/2019 CLINICAL DATA:  Altered level of consciousness. EXAM: CT HEAD WITHOUT CONTRAST TECHNIQUE: Contiguous axial images were  obtained from the base of the skull through the vertex without intravenous contrast. COMPARISON:  CT scan of April 09, 2013. FINDINGS: Brain: Mild chronic ischemic white matter disease is noted. No mass effect or midline shift is noted. Ventricular size is within normal limits. There is no evidence of mass lesion, hemorrhage or acute infarction. Vascular: No hyperdense vessel or unexpected calcification. Skull: Normal. Negative for fracture or focal lesion. Sinuses/Orbits: No acute finding. Other: None. IMPRESSION: Mild chronic ischemic white matter disease. No acute intracranial abnormality seen. Electronically Signed   By: Marijo Conception M.D.   On: 03/14/2019 12:59   Dg Chest Port 1 View  Result Date: 03/14/2019 CLINICAL DATA:  Shortness of breath. EXAM: PORTABLE CHEST 1 VIEW COMPARISON:  CT 02/02/2019. FINDINGS: PowerPort catheter noted with tip over cavoatrial junction. Heart size normal. No focal infiltrate. Mild left base subsegmental atelectasis. No pleural effusion or pneumothorax. IMPRESSION: 1.  PowerPort catheter with tip over cavoatrial junction. 2.  Mild left base subsegmental atelectasis. Electronically Signed   By: Marcello Moores  Register   On: 03/14/2019 09:25        Scheduled Meds: . amLODipine  2.5 mg Oral Daily  . apixaban  5 mg Oral BID  . levothyroxine  50 mcg Oral QAC breakfast  . losartan  50 mg Oral Daily  . polyethylene glycol  17 g Oral Daily   Continuous Infusions: . sodium chloride 125 mL/hr at 03/14/19 1727     LOS: 0 days    Time spent: 29 minutes spent on chart review, discussion with nursing staff, consultants, updating family and interview/physical exam; more than 50% of that time was spent in counseling and/or coordination of care.    Eric J [place] DO [facility] Pager [phone]  If 7PM-7AM, please contact night-coverage [email or web address] Password [facility] 03/15/2019, 3:03 PM

## 2019-03-15 NOTE — Evaluation (Signed)
Physical Therapy Evaluation Patient Details Name: Paula Peterson MRN: [id number] DOB: [birth year] Today's Date: 03/15/2019   History of Present Illness  78 yo female admitted with adverse effect of chemo, weakness, chest pain, dyspnea. Hx of stage IV ovarian cancer, PE, DVT, CVA, back pain  Clinical Impression  On eval, pt required Min assist for mobility. She walked ~25 feet with reliance on IV pole. Pt is unsteady and fatigues very easily. Dyspnea 3/4. Spo2 97% on RA once seated back in recliner. Recommend HHPT f/u and RW use for ambulation safety,if pt is agreeable. Will follow during hospital stay.     Follow Up Recommendations Home health PT;Supervision/Assistance - 24 hour(if pt agreeable)    Equipment Recommendations  Rolling walker with 5" wheels(if pt agreeable)    Recommendations for Other Services       Precautions / Restrictions Precautions Precautions: Fall Restrictions Weight Bearing Restrictions: No      Mobility  Bed Mobility Overal bed mobility: Modified Independent             General bed mobility comments: oob in recliner  Transfers Overall transfer level: Needs assistance Equipment used: None Transfers: Sit to/from Stand Sit to Stand: Min guard         General transfer comment: for safety  Ambulation/Gait Ambulation/Gait assistance: Min assist Gait Distance (Feet): 25 Feet(15'x1, 25'x1) Assistive device: IV Pole Gait Pattern/deviations: Decreased step length - right;Decreased step length - left;Shuffle     General Gait Details: very short steps due to weakness, impaired balance. pt fatigues very easily. dyspnea 3/4 with activity.  Stairs            Wheelchair Mobility    Modified Rankin (Stroke Patients Only)       Balance Overall balance assessment: Needs assistance           Standing balance-Leahy Scale: Fair                               Pertinent Vitals/Pain Pain Assessment: Faces Faces Pain Scale:  Hurts little more Pain Location: back Pain Descriptors / Indicators: Discomfort;Aching;Sore Pain Intervention(s): Monitored during session    Home Living Family/patient expects to be discharged to:: Private residence Living Arrangements: Spouse/significant other Available Help at Discharge: Family           Home Equipment: None      Prior Function Level of Independence: (mod I, furniture walks)         Comments: husband helps with IADLs; will do anything she needs     Hand Dominance        Extremity/Trunk Assessment   Upper Extremity Assessment Upper Extremity Assessment: Generalized weakness    Lower Extremity Assessment Lower Extremity Assessment: Generalized weakness    Cervical / Trunk Assessment Cervical / Trunk Assessment: Normal  Communication   Communication: No difficulties  Cognition Arousal/Alertness: Awake/alert Behavior During Therapy: WFL for tasks assessed/performed Overall Cognitive Status: Within Functional Limits for tasks assessed                                        General Comments General comments (skin integrity, edema, etc.): up up to 143 after wa,lking to bathroom; sats 99% but dyspnea 2/4 on RA after trip to bathroom    Exercises     Assessment/Plan    PT Assessment Patient needs continued PT  services  PT Problem List Decreased strength;Decreased mobility;Decreased activity tolerance;Decreased balance       PT Treatment Interventions DME instruction;Gait training;Therapeutic exercise;Therapeutic activities;Patient/family education;Functional mobility training    PT Goals (Current goals can be found in the Care Plan section)  Acute Rehab PT Goals Patient Stated Goal: get strength back PT Goal Formulation: With patient Time For Goal Achievement: 03/29/19 Potential to Achieve Goals: Good    Frequency Min 3X/week   Barriers to discharge        Co-evaluation               AM-PAC PT "6 Clicks"  Mobility  Outcome Measure Help needed turning from your back to your side while in a flat bed without using bedrails?: A Little Help needed moving from lying on your back to sitting on the side of a flat bed without using bedrails?: A Little Help needed moving to and from a bed to a chair (including a wheelchair)?: A Little Help needed standing up from a chair using your arms (e.g., wheelchair or bedside chair)?: A Little Help needed to walk in hospital room?: A Little Help needed climbing 3-5 steps with a railing? : A Little 6 Click Score: 18    End of Session Equipment Utilized During Treatment: Gait belt Activity Tolerance: Patient limited by fatigue Patient left: in chair;with family/visitor present;with call bell/phone within reach   PT Visit Diagnosis: Unsteadiness on feet (R26.81);Muscle weakness (generalized) (M62.81)    Time: [id number] PT Time Calculation (min) (ACUTE ONLY): 11 min   Charges:   PT Evaluation $PT Eval Moderate Complexity: [place], PT Acute Rehabilitation Services Pager: [phone] Office: [phone]

## 2019-03-16 DIAGNOSIS — I952 Hypotension due to drugs: Principal | ICD-10-CM

## 2019-03-16 LAB — T3, FREE: T3, Free: 2.2 pg/mL (ref 2.0–4.4)

## 2019-03-16 MED ORDER — HEPARIN SOD (PORK) LOCK FLUSH 100 UNIT/ML IV SOLN
500.0000 [IU] | INTRAVENOUS | Status: AC | PRN
  Administered 2019-03-16: 12:00:00 500 [IU]

## 2019-03-16 NOTE — Progress Notes (Signed)
Reviewed discharge paperwork, medication regimen, & follow up appointments with patient and husband. Patient escorted to main entrance via wheelchair by NT.

## 2019-03-16 NOTE — TOC Initial Note (Signed)
Transition of Care [facility]) - Initial/Assessment Note    Patient Details  Name: Paula Peterson MRN: [id number] Date of Birth: 1940-11-02  [facility] CM/SW Contact:    Lynnell Catalan, RN Phone Number: 03/16/2019, 10:31 AM  Clinical Narrative:                 This CM spoke with pt at bedside. Pt declines home health services and states that she has a 4 wheeled walker at home and doesn't need another walker.  Expected Discharge Plan: Home/Self Care Barriers to Discharge: No Barriers Identified   Patient Goals and CMS Choice        Expected Discharge Plan and Services Expected Discharge Plan: Home/Self Care         Expected Discharge Date: 03/16/19                                    Prior Living Arrangements/Services                       Activities of Daily Living Home Assistive Devices/Equipment: Eyvonne Mechanic (specify quad or straight)(single point cane) ADL Screening (condition at time of admission) Patient's cognitive ability adequate to safely complete daily activities?: Yes Is the patient deaf or have difficulty hearing?: No Does the patient have difficulty seeing, even when wearing glasses/contacts?: No Does the patient have difficulty concentrating, remembering, or making decisions?: No Patient able to express need for assistance with ADLs?: Yes Does the patient have difficulty dressing or bathing?: Yes Independently performs ADLs?: No Communication: Independent Dressing (OT): Needs assistance Is this a change from baseline?: Change from baseline, expected to last >3 days Grooming: Needs assistance Is this a change from baseline?: Change from baseline, expected to last >3 days Feeding: Needs assistance Is this a change from baseline?: Change from baseline, expected to last >3 days Bathing: Needs assistance Is this a change from baseline?: Change from baseline, expected to last >3 days Toileting: Needs assistance Is this a  change from baseline?: Change from baseline, expected to last >3days In/Out Bed: Needs assistance Is this a change from baseline?: Change from baseline, expected to last >3 days Walks in Home: Needs assistance Is this a change from baseline?: Change from baseline, expected to last >3 days Does the patient have difficulty walking or climbing stairs?: Yes(secondary to weakness) Weakness of Legs: Both Weakness of Arms/Hands: Both  Permission Sought/Granted                  Emotional Assessment              Admission diagnosis:  Weakness [R53.1] PNA (pneumonia) [J18.9] Patient Active Problem List   Diagnosis Date Noted  . Adverse effect of chemotherapy 03/14/2019  . Pancytopenia, acquired ([place]) 03/10/2019  . Peripheral neuropathy due to chemotherapy ([place]) 03/10/2019  . Epigastric pain 02/09/2019  . PE (pulmonary thromboembolism) ([place]) 02/02/2019  . Chronic hip pain 01/27/2019  . Acquired hypothyroidism 01/27/2019  . Family history of stomach cancer   . Goals of care, counseling/discussion 01/06/2019  . Elevated liver enzymes 01/06/2019  . Other fatigue 01/06/2019  . Other constipation 12/07/2018  . Ovarian cancer ([place]) 12/06/2018   PCP:  Caryl Bis, MD Pharmacy:   [place], [place] [facility] [address] [place] [address] Phone: [phone] Fax: [phone]  Social Determinants of Health (SDOH) Interventions    Readmission Risk Interventions No flowsheet data found.

## 2019-03-16 NOTE — Discharge Summary (Signed)
Physician Discharge Summary  Paula Peterson [id number] DOB: 26-Nov-1940 DOA: 03/14/2019  PCP: Caryl Bis, MD  Admit date: 03/14/2019 Discharge date: 03/16/2019  Admitted From: Home Disposition: Home  Recommendations for Outpatient Follow-up:  1. Follow up with PCP in 1 week 2. Discontinued home amlodipine and losartan secondary to hypotension; continue to follow blood pressures following discharge. 3. Follow-up finalized blood cultures; negative x2 days at time of discharge  Home Health: PT/OT recommended home health; patient declined Equipment/Devices: Patient already has 2 walkers at home, has port in place  Discharge Condition: Stable CODE STATUS: DNR Diet recommendation: Heart Healthy   History of present illness:  Paula Peterson is a 78 year old Caucasian female with past medical history remarkable for stage IV ovarian cancer diagnosed April 2000 currently on reduced dose of paclitaxel, bilateral pulmonary embolisms with associated right heart strain and DVT right femoral popliteal peroneal soleal veins on Eliquis presented to the ED with 2 to 3 days of feeling poor after chemo on 9/14 spent the entire day in bed has had "chest heaviness" with something "laying on it" and severe intractable headaches that have worsened since today-was only able to take bites of toast this morning usually eats well this was her fifth chemo. She tells me that the main issue is the headaches and the weakness.  She needed multiple people to get her from the bedside to CT table when she came to triage therefore it was requested that she be admitted. She is pretty weak overall compared to prior and has never had these issues with prior chemo cycles  Hospital course:  Hypotension Generalized malaise, debility, weakness and fatigue Patient presenting to the ED with generalized malaise and weakness.  Patient currently on chemotherapy outpatient for underlying stage IV ovarian cancer.  Patient has had poor  oral intake during this timeframe and was noted to be hypotensive.  Initially concern for possible underlying pneumonia, chest x-ray was not negative with reduction of lactic acid from 2.8-1.4 with IV fluid hydration.  Patient was initially started on cefepime, procalcitonin was checked x2 which was negative and antibiotics were subsequently discontinued.  Her home antihypertensive losartan and amlodipine were also discontinued and she continued IV fluid hydration with normalization of her blood pressures.   Blood cultures remain negative x2 days at time of discharge.  Was seen by PT and OT who recommends home health; although patient and husband declined.  Patient's weakness and fatigue has improved, and feels ready for discharge home.  Atypical chest pain Patient reports some chest heaviness that was nonspecific on initial presentation.  EKG notable for NSR without any concerning ST elevation/depression or T wave inversions, normal intervals.  High-sensitivity troponin was trended and remained flat at 5 and 5.  Patient denies any further symptoms.  No concerning arrhythmias noted on telemetry while inpatient.  Essential hypertension Patient on losartan 50 mg p.o. daily and amlodipine 2.5 mg p.o. daily.  Patient with lethargy, decreased oral intake and hypotension.  Her home antihypertensives were discontinued.  Her blood pressure remained stable and normalized following IV fluid hydration.  Blood pressures have discharge 132/67.  Continue to follow blood pressures outpatient and restart antihypertensives if necessary.  Hypothyroidism TSH 3.455. Continue levothyroxine 50 mcg p.o. daily  Bilateral PE/DVT Currently oxygenating well on room air. Continue Eliquis 5 mg p.o. twice daily  Discharge Diagnoses:  Active Problems:   Adverse effect of chemotherapy    Discharge Instructions  Discharge Instructions    Call MD for:  difficulty breathing,  headache or visual disturbances   Complete by: As  directed    Call MD for:  extreme fatigue   Complete by: As directed    Call MD for:  hives   Complete by: As directed    Call MD for:  persistant dizziness or light-headedness   Complete by: As directed    Call MD for:  persistant nausea and vomiting   Complete by: As directed    Call MD for:  severe uncontrolled pain   Complete by: As directed    Call MD for:  temperature >100.4   Complete by: As directed    Diet - low sodium heart healthy   Complete by: As directed    Increase activity slowly   Complete by: As directed      Allergies as of 03/16/2019   No Known Allergies     Medication List    STOP taking these medications   amLODipine 2.5 MG tablet Commonly known as: NORVASC   losartan 50 MG tablet Commonly known as: COZAAR   traMADol 50 MG tablet Commonly known as: ULTRAM     TAKE these medications   acetaminophen 325 MG tablet Commonly known as: TYLENOL Take 650 mg by mouth every 6 (six) hours as needed for mild pain or headache.   apixaban 5 MG Tabs tablet Commonly known as: ELIQUIS Take 1 tablet (5 mg total) by mouth 2 (two) times daily.   dexamethasone 4 MG tablet Commonly known as: DECADRON Take 2 tabs at the night before and 2 tab the morning of chemotherapy, every 3 weeks, by mouth with food What changed:   how much to take  how to take this  when to take this  additional instructions   famotidine 40 MG tablet Commonly known as: PEPCID Take 40 mg by mouth 2 (two) times daily.   levothyroxine 50 MCG tablet Commonly known as: SYNTHROID Take 1 tablet (50 mcg total) by mouth daily before breakfast.   lidocaine-prilocaine cream Commonly known as: EMLA Apply to affected area once What changed:   how much to take  how to take this  when to take this  reasons to take this  additional instructions   ondansetron 8 MG tablet Commonly known as: Zofran Take 1 tablet (8 mg total) by mouth every 8 (eight) hours as needed for refractory  nausea / vomiting.   polyethylene glycol 17 g packet Commonly known as: MIRALAX / GLYCOLAX Take 17 g by mouth daily.   prochlorperazine 10 MG tablet Commonly known as: COMPAZINE Take 1 tablet (10 mg total) by mouth every 6 (six) hours as needed (Nausea or vomiting).      Follow-up Information    Caryl Bis, MD. Schedule an appointment as soon as possible for a visit in 1 week(s).   Specialty: Family Medicine Contact information: [address] [place] [address] [phone]          No Known Allergies  Consultations:  None   Procedures/Studies: Dg Chest 1 View  Result Date: 03/14/2019 CLINICAL DATA:  Chest heaviness for 2 days. EXAM: CHEST  1 VIEW COMPARISON:  Chest x-ray March 14, 2019 9 a.m. FINDINGS: Lateral view of the chest demonstrate clear lungs. Degenerative joint changes of the spine are identified. Central venous line is identified. IMPRESSION: No active disease. Electronically Signed   By: Abelardo Diesel M.D.   On: 03/14/2019 15:58   Ct Head Wo Contrast  Result Date: 03/14/2019 CLINICAL DATA:  Altered level of consciousness.  EXAM: CT HEAD WITHOUT CONTRAST TECHNIQUE: Contiguous axial images were obtained from the base of the skull through the vertex without intravenous contrast. COMPARISON:  CT scan of April 09, 2013. FINDINGS: Brain: Mild chronic ischemic white matter disease is noted. No mass effect or midline shift is noted. Ventricular size is within normal limits. There is no evidence of mass lesion, hemorrhage or acute infarction. Vascular: No hyperdense vessel or unexpected calcification. Skull: Normal. Negative for fracture or focal lesion. Sinuses/Orbits: No acute finding. Other: None. IMPRESSION: Mild chronic ischemic white matter disease. No acute intracranial abnormality seen. Electronically Signed   By: Marijo Conception M.D.   On: 03/14/2019 12:59   Dg Chest Port 1 View  Result Date: 03/14/2019 CLINICAL DATA:  Shortness of breath. EXAM: PORTABLE  CHEST 1 VIEW COMPARISON:  CT 02/02/2019. FINDINGS: PowerPort catheter noted with tip over cavoatrial junction. Heart size normal. No focal infiltrate. Mild left base subsegmental atelectasis. No pleural effusion or pneumothorax. IMPRESSION: 1.  PowerPort catheter with tip over cavoatrial junction. 2.  Mild left base subsegmental atelectasis. Electronically Signed   By: Marcello Moores  Register   On: 03/14/2019 09:25      Subjective: Patient seen and examined at bedside, resting comfortably with spouse present.  Feels fatigue and weakness is improved with IV fluids.  Ready for discharge home today.  Discussed with patient and her spouse that she will need to discontinue her antihypertensives for the time being until she follows up with her PCP.  They declined any further home health at this time.  Patient denies headache, no fever/chills/night sweats, no nausea cefonicid diarrhea, no chest pain, palpitations, no shortness of breath, no abdominal pain, no issues with bowel/bladder function.  No acute events overnight per nursing staff.   Discharge Exam: Vitals:   03/15/19 2059 03/16/19 0551  BP: 112/60 132/67  Pulse: 92 (!) 102  Resp: 18 18  Temp: 98.9 F (37.2 C) 98.2 F (36.8 C)  SpO2: 96% 96%   Vitals:   03/15/19 0548 03/15/19 1341 03/15/19 2059 03/16/19 0551  BP: 98/65 98/64 112/60 132/67  Pulse: 80 85 92 (!) 102  Resp: 17 16 18 18   Temp: 98.7 F (37.1 C) 98 F (36.7 C) 98.9 F (37.2 C) 98.2 F (36.8 C)  TempSrc: Oral Oral Oral Oral  SpO2: 94% 99% 96% 96%  Height:        General: Pt is alert, awake, not in acute distress, slightly pale in appearance Cardiovascular: RRR, S1/S2 +, no rubs, no gallops, port noted Respiratory: CTA bilaterally, no wheezing, no rhonchi Abdominal: Soft, NT, ND, bowel sounds + Extremities: no edema, no cyanosis    The results of significant diagnostics from this hospitalization (including imaging, microbiology, ancillary and laboratory) are listed below  for reference.     Microbiology: Recent Results (from the past 240 hour(s))  Culture, blood (Routine X 2) w Reflex to ID Panel     Status: None (Preliminary result)   Collection Time: 03/14/19  9:26 AM   Specimen: BLOOD  Result Value Ref Range Status   Specimen Description   Final    BLOOD PORTA CATH Performed at [facility] [address], [place], [place] [address]    Special Requests   Final    BOTTLES DRAWN AEROBIC AND ANAEROBIC Blood Culture adequate volume Performed at [facility] [address] [place], [place] [address]    Culture   Final    NO GROWTH < 24 HOURS Performed at [facility], [place] [address], [place], [place] [address]    Report Status PENDING  Incomplete  SARS Coronavirus 2 [facility] order, Performed in [facility] hospital lab) Nasopharyngeal Nasopharyngeal Swab     Status: None   Collection Time: 03/14/19  9:29 AM   Specimen: Nasopharyngeal Swab  Result Value Ref Range Status   SARS Coronavirus 2 NEGATIVE NEGATIVE Final    Comment: (NOTE) If result is NEGATIVE SARS-CoV-2 target nucleic acids are NOT DETECTED. The SARS-CoV-2 RNA is generally detectable in upper and lower  respiratory specimens during the acute phase of infection. The lowest  concentration of SARS-CoV-2 viral copies this assay can detect is 250  copies / mL. A negative result does not preclude SARS-CoV-2 infection  and should not be used as the sole basis for treatment or other  patient management decisions.  A negative result may occur with  improper specimen collection / handling, submission of specimen other  than nasopharyngeal swab, presence of viral mutation(s) within the  areas targeted by this assay, and inadequate number of viral copies  (<250 copies / mL). A negative result must be combined with clinical  observations, patient history, and epidemiological information. If result is POSITIVE SARS-CoV-2 target nucleic acids are  DETECTED. The SARS-CoV-2 RNA is generally detectable in upper and lower  respiratory specimens dur ing the acute phase of infection.  Positive  results are indicative of active infection with SARS-CoV-2.  Clinical  correlation with patient history and other diagnostic information is  necessary to determine patient infection status.  Positive results do  not rule out bacterial infection or co-infection with other viruses. If result is PRESUMPTIVE POSTIVE SARS-CoV-2 nucleic acids MAY BE PRESENT.   A presumptive positive result was obtained on the submitted specimen  and confirmed on repeat testing.  While 2019 novel coronavirus  (SARS-CoV-2) nucleic acids may be present in the submitted sample  additional confirmatory testing may be necessary for epidemiological  and / or clinical management purposes  to differentiate between  SARS-CoV-2 and other Sarbecovirus currently known to infect humans.  If clinically indicated additional testing with an alternate test  methodology [phone]) is advised. The SARS-CoV-2 RNA is generally  detectable in upper and lower respiratory sp ecimens during the acute  phase of infection. The expected result is Negative. Fact Sheet for Patients:  StrictlyIdeas.no Fact Sheet for Healthcare Providers: BankingDealers.co.za This test is not yet approved or cleared by the [place] FDA and has been authorized for detection and/or diagnosis of SARS-CoV-2 by FDA under an Emergency Use Authorization (EUA).  This EUA will remain in effect (meaning this test can be used) for the duration of the COVID-19 declaration under Section 564(b)(1) of the Act, 21 U.S.C. section 360bbb-3(b)(1), unless the authorization is terminated or revoked sooner. Performed at [facility], [place] [address], [place], [name] [address]   Culture, blood (Routine X 2) w Reflex to ID Panel     Status: None (Preliminary result)    Collection Time: 03/14/19 10:37 AM   Specimen: BLOOD  Result Value Ref Range Status   Specimen Description   Final    BLOOD RIGHT WRIST Performed at [facility] [address], [place], [place] [address]    Special Requests   Final    BOTTLES DRAWN AEROBIC AND ANAEROBIC Blood Culture adequate volume Performed at [place] [address] [place],  [address]    Culture   Final    NO GROWTH <  24 HOURS Performed at [facility], [place] [address] [place], [place] [address]    Report Status PENDING  Incomplete     Labs: BNP (last 3 results) No results for input(s): BNP in the last 8760 hours. Basic Metabolic Panel: Recent Labs  Lab 03/10/19 0925 03/14/19 0926 03/15/19 0500  NA 137 134* 133*  K 4.1 3.7 4.3  CL 103 97* 102  CO2 24 25 23   GLUCOSE 189* 155* 109*  BUN 17 19 18   CREATININE 1.01* 0.84 0.78  CALCIUM 10.1 9.8 9.4   Liver Function Tests: Recent Labs  Lab 03/10/19 0925 03/14/19 0926 03/15/19 0500  AST 22 24 21   ALT 31 29 24   ALKPHOS 100 64 57  BILITOT 0.3 0.6 0.7  PROT 7.6 6.8 5.9*  ALBUMIN 3.8 3.6 3.2*   No results for input(s): LIPASE, AMYLASE in the last 168 hours. No results for input(s): AMMONIA in the last 168 hours. CBC: Recent Labs  Lab 03/10/19 0925 03/14/19 0926 03/15/19 0500  WBC 3.7* 4.4 3.9*  NEUTROABS 2.8  --   --   HGB 11.1* 10.2* 9.3*  HCT 33.0* 30.4* 28.6*  MCV 94.8 97.7 100.4*  PLT 193 146* 125*   Cardiac Enzymes: No results for input(s): CKTOTAL, CKMB, CKMBINDEX, TROPONINI in the last 168 hours. BNP: Invalid input(s): POCBNP CBG: Recent Labs  Lab 03/14/19 0901  GLUCAP 222*   D-Dimer No results for input(s): DDIMER in the last 72 hours. Hgb A1c No results for input(s): HGBA1C in the last 72 hours. Lipid Profile No results for input(s): CHOL, HDL, LDLCALC, TRIG, CHOLHDL, LDLDIRECT in the last 72 hours. Thyroid function studies Recent Labs    03/15/19 1556   TSH 3.455  T3FREE 2.2   Anemia work up No results for input(s): VITAMINB12, FOLATE, FERRITIN, TIBC, IRON, RETICCTPCT in the last 72 hours. Urinalysis    Component Value Date/Time   COLORURINE YELLOW 03/14/2019 1037   APPEARANCEUR CLEAR 03/14/2019 1037   LABSPEC 1.017 03/14/2019 1037   PHURINE 7.0 03/14/2019 1037   GLUCOSEU NEGATIVE 03/14/2019 1037   HGBUR NEGATIVE 03/14/2019 1037   [place] 03/14/2019 1037   KETONESUR NEGATIVE 03/14/2019 1037   PROTEINUR NEGATIVE 03/14/2019 1037   UROBILINOGEN 0.2 04/08/2013 2040   NITRITE NEGATIVE 03/14/2019 1037   LEUKOCYTESUR NEGATIVE 03/14/2019 1037   Sepsis Labs Invalid input(s): PROCALCITONIN,  WBC,  LACTICIDVEN Microbiology Recent Results (from the past 240 hour(s))  Culture, blood (Routine X 2) w Reflex to ID Panel     Status: None (Preliminary result)   Collection Time: 03/14/19  9:26 AM   Specimen: BLOOD  Result Value Ref Range Status   Specimen Description   Final    BLOOD PORTA CATH Performed at [facility], [place] [address], [place], [place] [address]    Special Requests   Final    BOTTLES DRAWN AEROBIC AND ANAEROBIC Blood Culture adequate volume Performed at [facility] [address], [place], [place] [address]    Culture   Final    NO GROWTH < 24 HOURS Performed at [place] [address] [place], [place] [address]    Report Status PENDING  Incomplete  SARS Coronavirus 2 [facility] order, Performed in [facility] hospital lab) Nasopharyngeal Nasopharyngeal Swab     Status: None   Collection Time: 03/14/19  9:29 AM   Specimen: Nasopharyngeal Swab  Result Value Ref Range Status   SARS Coronavirus 2 NEGATIVE NEGATIVE Final    Comment: (  NOTE) If result is NEGATIVE SARS-CoV-2 target nucleic acids are NOT DETECTED. The SARS-CoV-2 RNA is generally detectable in upper and lower  respiratory specimens during the acute phase of infection. The lowest   concentration of SARS-CoV-2 viral copies this assay can detect is 250  copies / mL. A negative result does not preclude SARS-CoV-2 infection  and should not be used as the sole basis for treatment or other  patient management decisions.  A negative result may occur with  improper specimen collection / handling, submission of specimen other  than nasopharyngeal swab, presence of viral mutation(s) within the  areas targeted by this assay, and inadequate number of viral copies  (<250 copies / mL). A negative result must be combined with clinical  observations, patient history, and epidemiological information. If result is POSITIVE SARS-CoV-2 target nucleic acids are DETECTED. The SARS-CoV-2 RNA is generally detectable in upper and lower  respiratory specimens dur ing the acute phase of infection.  Positive  results are indicative of active infection with SARS-CoV-2.  Clinical  correlation with patient history and other diagnostic information is  necessary to determine patient infection status.  Positive results do  not rule out bacterial infection or co-infection with other viruses. If result is PRESUMPTIVE POSTIVE SARS-CoV-2 nucleic acids MAY BE PRESENT.   A presumptive positive result was obtained on the submitted specimen  and confirmed on repeat testing.  While 2019 novel coronavirus  (SARS-CoV-2) nucleic acids may be present in the submitted sample  additional confirmatory testing may be necessary for epidemiological  and / or clinical management purposes  to differentiate between  SARS-CoV-2 and other Sarbecovirus currently known to infect humans.  If clinically indicated additional testing with an alternate test  methodology [phone] is advised. The SARS-CoV-2 RNA is generally  detectable in upper and lower respiratory sp ecimens during the acute  phase of infection. The expected result is Negative. Fact Sheet for Patients:  StrictlyIdeas.no Fact Sheet  for Healthcare Providers: BankingDealers.co.za This test is not yet approved or cleared by the [place] FDA and has been authorized for detection and/or diagnosis of SARS-CoV-2 by FDA under an Emergency Use Authorization (EUA).  This EUA will remain in effect (meaning this test can be used) for the duration of the COVID-19 declaration under Section 564(b)(1) of the Act, 21 U.S.C. section 360bbb-3(b)(1), unless the authorization is terminated or revoked sooner. Performed at [facility], [place] [address] [place], [place] [address]   Culture, blood (Routine X 2) w Reflex to ID Panel     Status: None (Preliminary result)   Collection Time: 03/14/19 10:37 AM   Specimen: BLOOD  Result Value Ref Range Status   Specimen Description   Final    BLOOD RIGHT WRIST Performed at [facility] [address] [place], [place] [address]    Special Requests   Final    BOTTLES DRAWN AEROBIC AND ANAEROBIC Blood Culture adequate volume Performed at [facility] [address] [place], [place] [address]    Culture   Final    NO GROWTH < 24 HOURS Performed at [facility] [address], [place], [place] [address]    Report Status PENDING  Incomplete     Time coordinating discharge: Over 30 minutes  SIGNED:   Salvador Bigbee J [place] DO  [facility] 03/16/2019, 10:23 AM

## 2019-03-19 LAB — CULTURE, BLOOD (ROUTINE X 2)
Culture: NO GROWTH
Culture: NO GROWTH
Special Requests: ADEQUATE
Special Requests: ADEQUATE

## 2019-03-20 DIAGNOSIS — E860 Dehydration: Secondary | ICD-10-CM | POA: Diagnosis not present

## 2019-03-20 DIAGNOSIS — R300 Dysuria: Secondary | ICD-10-CM | POA: Diagnosis not present

## 2019-03-20 DIAGNOSIS — I10 Essential (primary) hypertension: Secondary | ICD-10-CM | POA: Diagnosis not present

## 2019-03-31 ENCOUNTER — Inpatient Hospital Stay: Payer: Medicare Other

## 2019-03-31 ENCOUNTER — Inpatient Hospital Stay: Payer: Medicare Other | Attending: Gynecologic Oncology

## 2019-03-31 ENCOUNTER — Telehealth: Payer: Self-pay | Admitting: Hematology and Oncology

## 2019-03-31 ENCOUNTER — Other Ambulatory Visit: Payer: Self-pay

## 2019-03-31 ENCOUNTER — Encounter: Payer: Self-pay | Admitting: Hematology and Oncology

## 2019-03-31 ENCOUNTER — Inpatient Hospital Stay: Payer: Medicare Other | Admitting: Hematology and Oncology

## 2019-03-31 ENCOUNTER — Other Ambulatory Visit: Payer: Self-pay | Admitting: Hematology and Oncology

## 2019-03-31 DIAGNOSIS — C569 Malignant neoplasm of unspecified ovary: Secondary | ICD-10-CM

## 2019-03-31 DIAGNOSIS — R531 Weakness: Secondary | ICD-10-CM | POA: Insufficient documentation

## 2019-03-31 DIAGNOSIS — I2699 Other pulmonary embolism without acute cor pulmonale: Secondary | ICD-10-CM

## 2019-03-31 DIAGNOSIS — K5909 Other constipation: Secondary | ICD-10-CM

## 2019-03-31 DIAGNOSIS — R5383 Other fatigue: Secondary | ICD-10-CM | POA: Diagnosis not present

## 2019-03-31 DIAGNOSIS — D61818 Other pancytopenia: Secondary | ICD-10-CM

## 2019-03-31 DIAGNOSIS — G8929 Other chronic pain: Secondary | ICD-10-CM | POA: Insufficient documentation

## 2019-03-31 DIAGNOSIS — C786 Secondary malignant neoplasm of retroperitoneum and peritoneum: Secondary | ICD-10-CM | POA: Diagnosis not present

## 2019-03-31 DIAGNOSIS — Z79899 Other long term (current) drug therapy: Secondary | ICD-10-CM | POA: Diagnosis not present

## 2019-03-31 DIAGNOSIS — M25559 Pain in unspecified hip: Secondary | ICD-10-CM | POA: Insufficient documentation

## 2019-03-31 DIAGNOSIS — G620 Drug-induced polyneuropathy: Secondary | ICD-10-CM | POA: Insufficient documentation

## 2019-03-31 DIAGNOSIS — Z7901 Long term (current) use of anticoagulants: Secondary | ICD-10-CM | POA: Insufficient documentation

## 2019-03-31 DIAGNOSIS — Z5111 Encounter for antineoplastic chemotherapy: Secondary | ICD-10-CM | POA: Insufficient documentation

## 2019-03-31 LAB — CBC WITH DIFFERENTIAL (CANCER CENTER ONLY)
Abs Immature Granulocytes: 0.01 10*3/uL (ref 0.00–0.07)
Basophils Absolute: 0 10*3/uL (ref 0.0–0.1)
Basophils Relative: 0 %
Eosinophils Absolute: 0 10*3/uL (ref 0.0–0.5)
Eosinophils Relative: 0 %
HCT: 28.7 % — ABNORMAL LOW (ref 36.0–46.0)
Hemoglobin: 9.5 g/dL — ABNORMAL LOW (ref 12.0–15.0)
Immature Granulocytes: 0 %
Lymphocytes Relative: 20 %
Lymphs Abs: 0.8 10*3/uL (ref 0.7–4.0)
MCH: 33.1 pg (ref 26.0–34.0)
MCHC: 33.1 g/dL (ref 30.0–36.0)
MCV: 100 fL (ref 80.0–100.0)
Monocytes Absolute: 0.2 10*3/uL (ref 0.1–1.0)
Monocytes Relative: 4 %
Neutro Abs: 3 10*3/uL (ref 1.7–7.7)
Neutrophils Relative %: 76 %
Platelet Count: 165 10*3/uL (ref 150–400)
RBC: 2.87 MIL/uL — ABNORMAL LOW (ref 3.87–5.11)
RDW: 18.4 % — ABNORMAL HIGH (ref 11.5–15.5)
WBC Count: 3.9 10*3/uL — ABNORMAL LOW (ref 4.0–10.5)
nRBC: 0 % (ref 0.0–0.2)

## 2019-03-31 LAB — CMP (CANCER CENTER ONLY)
ALT: 16 U/L (ref 0–44)
AST: 15 U/L (ref 15–41)
Albumin: 3.7 g/dL (ref 3.5–5.0)
Alkaline Phosphatase: 107 U/L (ref 38–126)
Anion gap: 13 (ref 5–15)
BUN: 20 mg/dL (ref 8–23)
CO2: 23 mmol/L (ref 22–32)
Calcium: 10.3 mg/dL (ref 8.9–10.3)
Chloride: 103 mmol/L (ref 98–111)
Creatinine: 0.91 mg/dL (ref 0.44–1.00)
GFR, Est AFR Am: >60 mL/min (ref >60)
GFR, Estimated: >60 mL/min (ref >60)
Glucose, Bld: 162 mg/dL — ABNORMAL HIGH (ref 70–99)
Potassium: 3.9 mmol/L (ref 3.5–5.1)
Sodium: 139 mmol/L (ref 135–145)
Total Bilirubin: 0.2 mg/dL — ABNORMAL LOW (ref 0.3–1.2)
Total Protein: 7.7 g/dL (ref 6.5–8.1)

## 2019-03-31 MED ORDER — DIPHENHYDRAMINE HCL 50 MG/ML IJ SOLN
50.0000 mg | Freq: Once | INTRAMUSCULAR | Status: AC
  Administered 2019-03-31: 10:00:00 50 mg via INTRAVENOUS

## 2019-03-31 MED ORDER — SODIUM CHLORIDE 0.9 % IV SOLN
Freq: Once | INTRAVENOUS | Status: AC
  Administered 2019-03-31: 10:00:00 via INTRAVENOUS
  Filled 2019-03-31: qty 250

## 2019-03-31 MED ORDER — SODIUM CHLORIDE 0.9 % IV SOLN
116.6667 mg/m2 | Freq: Once | INTRAVENOUS | Status: AC
  Administered 2019-03-31: 252 mg via INTRAVENOUS
  Filled 2019-03-31: qty 42

## 2019-03-31 MED ORDER — FAMOTIDINE IN NACL 20-0.9 MG/50ML-% IV SOLN
INTRAVENOUS | Status: AC
  Filled 2019-03-31: qty 50

## 2019-03-31 MED ORDER — SODIUM CHLORIDE 0.9% FLUSH
10.0000 mL | Freq: Once | INTRAVENOUS | Status: AC
  Administered 2019-03-31: 10 mL
  Filled 2019-03-31: qty 10

## 2019-03-31 MED ORDER — PALONOSETRON HCL INJECTION 0.25 MG/5ML
INTRAVENOUS | Status: AC
  Filled 2019-03-31: qty 5

## 2019-03-31 MED ORDER — DIPHENHYDRAMINE HCL 50 MG/ML IJ SOLN
INTRAMUSCULAR | Status: AC
  Filled 2019-03-31: qty 1

## 2019-03-31 MED ORDER — SODIUM CHLORIDE 0.9 % IV SOLN
497.0000 mg | Freq: Once | INTRAVENOUS | Status: AC
  Administered 2019-03-31: 500 mg via INTRAVENOUS
  Filled 2019-03-31: qty 50

## 2019-03-31 MED ORDER — SODIUM CHLORIDE 0.9 % IV SOLN
Freq: Once | INTRAVENOUS | Status: AC
  Administered 2019-03-31: 10:00:00 via INTRAVENOUS
  Filled 2019-03-31: qty 5

## 2019-03-31 MED ORDER — SODIUM CHLORIDE 0.9% FLUSH
10.0000 mL | INTRAVENOUS | Status: DC | PRN
  Administered 2019-03-31: 10 mL
  Filled 2019-03-31: qty 10

## 2019-03-31 MED ORDER — PALONOSETRON HCL INJECTION 0.25 MG/5ML
0.2500 mg | Freq: Once | INTRAVENOUS | Status: AC
  Administered 2019-03-31: 0.25 mg via INTRAVENOUS

## 2019-03-31 MED ORDER — FAMOTIDINE IN NACL 20-0.9 MG/50ML-% IV SOLN
20.0000 mg | Freq: Once | INTRAVENOUS | Status: AC
  Administered 2019-03-31: 20 mg via INTRAVENOUS

## 2019-03-31 MED ORDER — HEPARIN SOD (PORK) LOCK FLUSH 100 UNIT/ML IV SOLN
500.0000 [IU] | Freq: Once | INTRAVENOUS | Status: AC | PRN
  Administered 2019-03-31: 500 [IU]
  Filled 2019-03-31: qty 5

## 2019-03-31 MED FILL — ELIQUIS 5 MG TABLET: 5 | 30 days supply | Qty: 60 | Fill #1

## 2019-03-31 NOTE — Assessment & Plan Note (Signed)
She has no bleeding complications from anticoagulation therapy I recommend her to continue anticoagulation treatment as prescribed Due to recent diagnosis of PE, she is not a candidate for surgery for at least 3 months I plan to repeat imaging study in November

## 2019-03-31 NOTE — Assessment & Plan Note (Signed)
She denies recent constipation She will continue laxatives as needed. 

## 2019-03-31 NOTE — Telephone Encounter (Signed)
I sent to [facility] to print out and give to patient in tx area

## 2019-03-31 NOTE — Assessment & Plan Note (Signed)
She has progressive pancytopenia due to her high BSA and calculated dose I plan to reduce carboplatin to AUC of 5 She does not need transfusion support today

## 2019-03-31 NOTE — Assessment & Plan Note (Signed)
She tolerated treatment poorly with progressive weakness and pancytopenia We discussed changing management I recommend close follow-up with weekly blood draw and clinic visit I recommend reducing the dose of carboplatin to AUC of 5 due to progressive pancytopenia We will manage side effects aggressively Ideally, I would like to proceed with cycle 7 of treatment at the end of the month before repeat imaging study next month I will reassess carefully She may or may not be able to tolerate cycle 7 of treatment

## 2019-03-31 NOTE — Progress Notes (Signed)
[facility] OFFICE PROGRESS NOTE  Patient Care Team: Caryl Bis, MD as PCP - General (Family Medicine)  ASSESSMENT & PLAN:  Ovarian cancer [facility]) She tolerated treatment poorly with progressive weakness and pancytopenia We discussed changing management I recommend close follow-up with weekly blood draw and clinic visit I recommend reducing the dose of carboplatin to AUC of 5 due to progressive pancytopenia We will manage side effects aggressively Ideally, I would like to proceed with cycle 7 of treatment at the end of the month before repeat imaging study next month I will reassess carefully She may or may not be able to tolerate cycle 7 of treatment  Pancytopenia, acquired ([place]) She has progressive pancytopenia due to her high BSA and calculated dose I plan to reduce carboplatin to AUC of 5 She does not need transfusion support today  Other constipation She denies recent constipation She will continue laxatives as needed  Chronic hip pain She has mild flare of hip pain recently The patient is somewhat worried about taking narcotic prescription I recommend her to take pain medicine as needed  PE (pulmonary thromboembolism) ([place]) She has no bleeding complications from anticoagulation therapy I recommend her to continue anticoagulation treatment as prescribed Due to recent diagnosis of PE, she is not a candidate for surgery for at least 3 months I plan to repeat imaging study in November   Orders Placed This Encounter  Procedures  . Sample to Blood Bank    Standing Status:   Standing    Number of Occurrences:   9    Standing Expiration Date:   03/30/2020    INTERVAL HISTORY: Please see below for problem oriented charting. She is seen prior to cycle 6 of treatment She was hospitalized recently due to weakness She complained of excessive fatigue She also have a recent flare of hip pain but did not take her prescribed pain medicine She was only taking  Tylenol No recent fever or chills Denies nausea She is able to get her bowel habits regulated with MiraLAX The patient denies any recent signs or symptoms of bleeding such as spontaneous epistaxis, hematuria or hematochezia. She denies recent falls  SUMMARY OF ONCOLOGIC HISTORY: Oncology History  Ovarian cancer ([place])  10/24/2018 Initial Diagnosis   Her symptoms began in April/May, 2020.  She has bloating and early satiety. Shortness of breath with walking. She denied bleeding. She reported constipation with pain with defecation and narrowed stools   11/29/2018 Imaging   1. 13 cm complex cystic lesion in the central pelvis, highly suspicious for ovarian cystadenocarcinoma. 2. Diffuse peritoneal carcinomatosis with mild ascites. 3. Mild lymphadenopathy in porta hepatis and right cardiophrenic angle, suspicious for metastatic disease. 4. Moderate right and tiny left pleural effusions   12/05/2018 Tumor Marker   Patient's tumor was tested for the following markers: CA-125 Results of the tumor marker test revealed 1015.   12/06/2018 Cancer Staging   Staging form: Ovary, Fallopian Tube, and Primary Peritoneal Carcinoma, AJCC 8th Edition - Clinical: FIGO Stage IVA, calculated as Stage IV (cT3c, cN1, cM1) - Signed by [name], MD on 12/06/2018   12/09/2018 Pathology Results   PLEURAL FLUID, RIGHT (SPECIMEN 1 OF 1 COLLECTED 12/09/18): - MALIGNANT CELLS CONSISTENT WITH METASTATIC ADENOCARCINOMA - SEE COMMENT Comment The neoplastic cells are positive for cytokeratin 7 and Pax-8 but negative for cytokeratin 20, TTF-1, CDX-2 and Gata-3. Overall, the phenotype is consistent with the clinical impression of gynecologic primary.    12/15/2018 Procedure   Placement of  single lumen port a cath via right internal jugular vein. The catheter tip lies at the cavo-atrial junction. A power injectable port a cath was placed and is ready for immediate use   12/16/2018 -  Chemotherapy   The patient had carboplatin  and taxol for chemotherapy treatment.     01/06/2019 Tumor Marker   Patient's tumor was tested for the following markers: CA-125 Results of the tumor marker test revealed 948   02/02/2019 Imaging   1. Massive pulmonary embolism, as discussed above. Given the mildly elevated RV to LV ratio of 0.95, this is associated with increased risk of morbidity and mortality. 2. Today's study demonstrates a mixed response to therapy. Specifically, while there has been regression of the bulky intraperitoneal metastatic disease and regression of previously noted pleural effusions, the large cystic mass in the central pelvis has increased in size compared to the prior study. 3. New right mild hydroureteronephrosis related to extrinsic compression on the distal third of the right ureter by the patient's large pelvic mass. 4. Scattered small pulmonary nodules (predominantly pleural based) appear stable compared to prior examinations. These are nonspecific but warrant continued attention on follow-up studies. 5. Aortic atherosclerosis, in addition to three-vessel coronary artery disease. Assessment for potential risk factor modification, dietary therapy or pharmacologic therapy may be warranted, if clinically indicated.   02/02/2019 - 02/04/2019 Hospital Admission   She was admitted the hospital due to DVT and PE   02/03/2019 Imaging   Bilateral venous Doppler US Right: Findings consistent with acute deep vein thrombosis involving the right femoral vein, right popliteal vein, right peroneal veins, right soleal veins, and right gastrocnemius veins. No cystic structure found in the popliteal fossa. Left: There is no evidence of deep vein thrombosis in the lower extremity. No cystic structure found in the popliteal fossa   02/17/2019 Tumor Marker   Patient's tumor was tested for the following markers: CA-125 Results of the tumor marker test revealed 127   03/10/2019 Tumor Marker   Patient's tumor was tested for the  following markers: CA-125. Results of the tumor marker test revealed 87.4   03/14/2019 - 03/16/2019 Hospital Admission   She was admitted to the hospital recently for weakness     REVIEW OF SYSTEMS:   Constitutional: Denies fevers, chills or abnormal weight loss Eyes: Denies blurriness of vision Ears, nose, mouth, throat, and face: Denies mucositis or sore throat Respiratory: Denies cough, dyspnea or wheezes Cardiovascular: Denies palpitation, chest discomfort or lower extremity swelling Skin: Denies abnormal skin rashes Lymphatics: Denies new lymphadenopathy or easy bruising Behavioral/Psych: Mood is stable, no new changes  All other systems were reviewed with the patient and are negative.  I have reviewed the past medical history, past surgical history, social history and family history with the patient and they are unchanged from previous note.  ALLERGIES:  has No Known Allergies.  MEDICATIONS:  Current Outpatient Medications  Medication Sig Dispense Refill  . acetaminophen (TYLENOL) 325 MG tablet Take 650 mg by mouth every 6 (six) hours as needed for mild pain or headache.    Marland Kitchen apixaban (ELIQUIS) 5 MG TABS tablet Take 1 tablet (5 mg total) by mouth 2 (two) times daily. 60 tablet 11  . dexamethasone (DECADRON) 4 MG tablet Take 2 tabs at the night before and 2 tab the morning of chemotherapy, every 3 weeks, by mouth with food (Patient taking differently: Take 8 mg by mouth as directed. Take 8mg  at the night before and 8mg  the morning of chemotherapy,  every 3 weeks, by mouth with food) 24 tablet 9  . famotidine (PEPCID) 40 MG tablet Take 40 mg by mouth 2 (two) times daily.    Marland Kitchen levothyroxine (SYNTHROID) 50 MCG tablet Take 1 tablet (50 mcg total) by mouth daily before breakfast.    . lidocaine-prilocaine (EMLA) cream Apply to affected area once (Patient taking differently: Apply 1 application topically as needed (port access). ) 30 g 3  . ondansetron (ZOFRAN) 8 MG tablet Take 1 tablet  (8 mg total) by mouth every 8 (eight) hours as needed for refractory nausea / vomiting. 30 tablet 1  . polyethylene glycol (MIRALAX / GLYCOLAX) 17 g packet Take 17 g by mouth daily.     . prochlorperazine (COMPAZINE) 10 MG tablet Take 1 tablet (10 mg total) by mouth every 6 (six) hours as needed (Nausea or vomiting). 30 tablet 1   No current facility-administered medications for this visit.    Facility-Administered Medications Ordered in Other Visits  Medication Dose Route Frequency Provider Last Rate Last Dose  . 0.9 %  sodium chloride infusion   Intravenous Once Alexx Mcburney, MD      . CARBOplatin (PARAPLATIN) 500 mg in sodium chloride 0.9 % 250 mL chemo infusion  500 mg Intravenous Once Alvy Bimler, Dominica Kent, MD      . diphenhydrAMINE (BENADRYL) injection 50 mg  50 mg Intravenous Once Alvy Bimler, Bryne Lindon, MD      . famotidine (PEPCID) IVPB 20 mg premix  20 mg Intravenous Once Alvy Bimler, Tawnie Ehresman, MD      . fosaprepitant (EMEND) 150 mg, dexamethasone (DECADRON) 12 mg in sodium chloride 0.9 % 145 mL IVPB   Intravenous Once Alvy Bimler, Taviana Westergren, MD      . heparin lock flush 100 unit/mL  500 Units Intracatheter Once PRN Alvy Bimler, Miarose Lippert, MD      . PACLitaxel (TAXOL) 252 mg in sodium chloride 0.9 % 250 mL chemo infusion (> 80mg /m2)  [id number] mg/m2 (Treatment Plan Recorded) Intravenous Once Alvy Bimler, Makena Mcgrady, MD      . palonosetron (ALOXI) injection 0.25 mg  0.25 mg Intravenous Once Ranger Petrich, MD      . sodium chloride flush (NS) 0.9 % injection 10 mL  10 mL Intracatheter PRN Alvy Bimler, Maahi Lannan, MD        PHYSICAL EXAMINATION: ECOG PERFORMANCE STATUS: 2 - Symptomatic, <50% confined to bed  Vitals:   03/31/19 0903  BP: (!) 126/59  Pulse: 85  Resp: 18  Temp: 98.3 F (36.8 C)  SpO2: 98%   Filed Weights   03/31/19 0903  Weight: 209 lb 8 oz (95 kg)    GENERAL:alert, no distress and comfortable SKIN: skin color, texture, turgor are normal, no rashes or significant lesions EYES: normal, Conjunctiva are pink and non-injected, sclera  clear OROPHARYNX:no exudate, no erythema and lips, buccal mucosa, and tongue normal  NECK: supple, thyroid normal size, non-tender, without nodularity LYMPH:  no palpable lymphadenopathy in the cervical, axillary or inguinal LUNGS: clear to auscultation and percussion with normal breathing effort HEART: regular rate & rhythm and no murmurs and no lower extremity edema ABDOMEN:abdomen soft, non-tender and normal bowel sounds Musculoskeletal:no cyanosis of digits and no clubbing  NEURO: alert & oriented x 3 with fluent speech, no focal motor/sensory deficits  LABORATORY DATA:  I have reviewed the data as listed    Component Value Date/Time   NA 139 03/31/2019 0845   K 3.9 03/31/2019 0845   CL 103 03/31/2019 0845   CO2 23 03/31/2019 0845   GLUCOSE 162 (H) 03/31/2019  0845   BUN 20 03/31/2019 0845   CREATININE 0.91 03/31/2019 0845   CALCIUM 10.3 03/31/2019 0845   PROT 7.7 03/31/2019 0845   ALBUMIN 3.7 03/31/2019 0845   AST 15 03/31/2019 0845   ALT 16 03/31/2019 0845   ALKPHOS 107 03/31/2019 0845   BILITOT 0.2 (L) 03/31/2019 0845   GFRNONAA >60 03/31/2019 0845   GFRAA >60 03/31/2019 0845    No results found for: SPEP, UPEP  Lab Results  Component Value Date   WBC 3.9 (L) 03/31/2019   NEUTROABS 3.0 03/31/2019   HGB 9.5 (L) 03/31/2019   HCT 28.7 (L) 03/31/2019   MCV 100.0 03/31/2019   PLT 165 03/31/2019      Chemistry      Component Value Date/Time   NA 139 03/31/2019 0845   K 3.9 03/31/2019 0845   CL 103 03/31/2019 0845   CO2 23 03/31/2019 0845   BUN 20 03/31/2019 0845   CREATININE 0.91 03/31/2019 0845      Component Value Date/Time   CALCIUM 10.3 03/31/2019 0845   ALKPHOS 107 03/31/2019 0845   AST 15 03/31/2019 0845   ALT 16 03/31/2019 0845   BILITOT 0.2 (L) 03/31/2019 0845       RADIOGRAPHIC STUDIES: I have personally reviewed the radiological images as listed and agreed with the findings in the report. Dg Chest 1 View  Result Date: 03/14/2019 CLINICAL  DATA:  Chest heaviness for 2 days. EXAM: CHEST  1 VIEW COMPARISON:  Chest x-ray March 14, 2019 9 a.m. FINDINGS: Lateral view of the chest demonstrate clear lungs. Degenerative joint changes of the spine are identified. Central venous line is identified. IMPRESSION: No active disease. Electronically Signed   By: Abelardo Diesel M.D.   On: 03/14/2019 15:58   Ct Head Wo Contrast  Result Date: 03/14/2019 CLINICAL DATA:  Altered level of consciousness. EXAM: CT HEAD WITHOUT CONTRAST TECHNIQUE: Contiguous axial images were obtained from the base of the skull through the vertex without intravenous contrast. COMPARISON:  CT scan of April 09, 2013. FINDINGS: Brain: Mild chronic ischemic white matter disease is noted. No mass effect or midline shift is noted. Ventricular size is within normal limits. There is no evidence of mass lesion, hemorrhage or acute infarction. Vascular: No hyperdense vessel or unexpected calcification. Skull: Normal. Negative for fracture or focal lesion. Sinuses/Orbits: No acute finding. Other: None. IMPRESSION: Mild chronic ischemic white matter disease. No acute intracranial abnormality seen. Electronically Signed   By: Marijo Conception M.D.   On: 03/14/2019 12:59   Dg Chest Port 1 View  Result Date: 03/14/2019 CLINICAL DATA:  Shortness of breath. EXAM: PORTABLE CHEST 1 VIEW COMPARISON:  CT 02/02/2019. FINDINGS: PowerPort catheter noted with tip over cavoatrial junction. Heart size normal. No focal infiltrate. Mild left base subsegmental atelectasis. No pleural effusion or pneumothorax. IMPRESSION: 1.  PowerPort catheter with tip over cavoatrial junction. 2.  Mild left base subsegmental atelectasis. Electronically Signed   By: Marcello Moores  Register   On: 03/14/2019 09:25    All questions were answered. The patient knows to call the clinic with any problems, questions or concerns. No barriers to learning was detected.  I spent 25 minutes counseling the patient face to face. The total time  spent in the appointment was 30 minutes and more than 50% was on counseling and review of test results  [name], MD 03/31/2019 9:30 AM

## 2019-03-31 NOTE — Patient Instructions (Signed)
[facility] Discharge Instructions for Patients Receiving Chemotherapy  Today you received the following chemotherapy agents:  Taxol, Carboplatin  To help prevent nausea and vomiting after your treatment, we encourage you to take your nausea medication as prescribed.   If you develop nausea and vomiting that is not controlled by your nausea medication, call the clinic.   BELOW ARE SYMPTOMS THAT SHOULD BE REPORTED IMMEDIATELY:  *FEVER GREATER THAN 100.5 F  *CHILLS WITH OR WITHOUT FEVER  NAUSEA AND VOMITING THAT IS NOT CONTROLLED WITH YOUR NAUSEA MEDICATION  *UNUSUAL SHORTNESS OF BREATH  *UNUSUAL BRUISING OR BLEEDING  TENDERNESS IN MOUTH AND THROAT WITH OR WITHOUT PRESENCE OF ULCERS  *URINARY PROBLEMS  *BOWEL PROBLEMS  UNUSUAL RASH Items with * indicate a potential emergency and should be followed up as soon as possible.  Feel free to call the clinic should you have any questions or concerns. The clinic phone number is [phone].  Please show the CHEMO ALERT CARD at check-in to the Emergency Department and triage nurse.   

## 2019-03-31 NOTE — Assessment & Plan Note (Signed)
She has mild flare of hip pain recently The patient is somewhat worried about taking narcotic prescription I recommend her to take pain medicine as needed

## 2019-04-01 LAB — CA 125: Cancer Antigen (CA) 125: 59.2 U/mL — ABNORMAL HIGH (ref 0.0–38.1)

## 2019-04-03 ENCOUNTER — Telehealth: Payer: Self-pay

## 2019-04-03 NOTE — Telephone Encounter (Signed)
-----   Message from [name], MD sent at 04/03/2019  8:04 AM EDT ----- Regarding: pls let her know CA-125 is better

## 2019-04-03 NOTE — Telephone Encounter (Signed)
Called and left below message. Ask her to call for questions. 

## 2019-04-07 ENCOUNTER — Inpatient Hospital Stay: Payer: Medicare Other

## 2019-04-07 ENCOUNTER — Other Ambulatory Visit: Payer: Self-pay

## 2019-04-07 ENCOUNTER — Encounter: Payer: Self-pay | Admitting: Hematology and Oncology

## 2019-04-07 ENCOUNTER — Inpatient Hospital Stay: Payer: Medicare Other | Admitting: Hematology and Oncology

## 2019-04-07 DIAGNOSIS — Z7901 Long term (current) use of anticoagulants: Secondary | ICD-10-CM | POA: Diagnosis not present

## 2019-04-07 DIAGNOSIS — C569 Malignant neoplasm of unspecified ovary: Secondary | ICD-10-CM | POA: Diagnosis not present

## 2019-04-07 DIAGNOSIS — I2699 Other pulmonary embolism without acute cor pulmonale: Secondary | ICD-10-CM | POA: Diagnosis not present

## 2019-04-07 DIAGNOSIS — Z79899 Other long term (current) drug therapy: Secondary | ICD-10-CM | POA: Diagnosis not present

## 2019-04-07 DIAGNOSIS — G620 Drug-induced polyneuropathy: Secondary | ICD-10-CM | POA: Diagnosis not present

## 2019-04-07 DIAGNOSIS — D61818 Other pancytopenia: Secondary | ICD-10-CM | POA: Diagnosis not present

## 2019-04-07 DIAGNOSIS — M25559 Pain in unspecified hip: Secondary | ICD-10-CM | POA: Diagnosis not present

## 2019-04-07 DIAGNOSIS — K5909 Other constipation: Secondary | ICD-10-CM | POA: Diagnosis not present

## 2019-04-07 DIAGNOSIS — Z5111 Encounter for antineoplastic chemotherapy: Secondary | ICD-10-CM | POA: Diagnosis not present

## 2019-04-07 DIAGNOSIS — C786 Secondary malignant neoplasm of retroperitoneum and peritoneum: Secondary | ICD-10-CM | POA: Diagnosis not present

## 2019-04-07 DIAGNOSIS — R5383 Other fatigue: Secondary | ICD-10-CM | POA: Diagnosis not present

## 2019-04-07 DIAGNOSIS — G8929 Other chronic pain: Secondary | ICD-10-CM | POA: Diagnosis not present

## 2019-04-07 DIAGNOSIS — R531 Weakness: Secondary | ICD-10-CM | POA: Diagnosis not present

## 2019-04-07 LAB — CBC WITH DIFFERENTIAL (CANCER CENTER ONLY)
Abs Immature Granulocytes: 0.01 10*3/uL (ref 0.00–0.07)
Basophils Absolute: 0 10*3/uL (ref 0.0–0.1)
Basophils Relative: 0 %
Eosinophils Absolute: 0 10*3/uL (ref 0.0–0.5)
Eosinophils Relative: 0 %
HCT: 26 % — ABNORMAL LOW (ref 36.0–46.0)
Hemoglobin: 8.8 g/dL — ABNORMAL LOW (ref 12.0–15.0)
Immature Granulocytes: 0 %
Lymphocytes Relative: 63 %
Lymphs Abs: 1.7 10*3/uL (ref 0.7–4.0)
MCH: 34.2 pg — ABNORMAL HIGH (ref 26.0–34.0)
MCHC: 33.8 g/dL (ref 30.0–36.0)
MCV: 101.2 fL — ABNORMAL HIGH (ref 80.0–100.0)
Monocytes Absolute: 0.2 10*3/uL (ref 0.1–1.0)
Monocytes Relative: 6 %
Neutro Abs: 0.8 10*3/uL — ABNORMAL LOW (ref 1.7–7.7)
Neutrophils Relative %: 31 %
Platelet Count: 140 10*3/uL — ABNORMAL LOW (ref 150–400)
RBC: 2.57 MIL/uL — ABNORMAL LOW (ref 3.87–5.11)
RDW: 17.4 % — ABNORMAL HIGH (ref 11.5–15.5)
WBC Count: 2.7 10*3/uL — ABNORMAL LOW (ref 4.0–10.5)
nRBC: 0 % (ref 0.0–0.2)

## 2019-04-07 LAB — CMP (CANCER CENTER ONLY)
ALT: 18 U/L (ref 0–44)
AST: 17 U/L (ref 15–41)
Albumin: 3.4 g/dL — ABNORMAL LOW (ref 3.5–5.0)
Alkaline Phosphatase: 81 U/L (ref 38–126)
Anion gap: 13 (ref 5–15)
BUN: 20 mg/dL (ref 8–23)
CO2: 25 mmol/L (ref 22–32)
Calcium: 9.5 mg/dL (ref 8.9–10.3)
Chloride: 102 mmol/L (ref 98–111)
Creatinine: 0.86 mg/dL (ref 0.44–1.00)
GFR, Est AFR Am: >60 mL/min (ref >60)
GFR, Estimated: >60 mL/min (ref >60)
Glucose, Bld: 106 mg/dL — ABNORMAL HIGH (ref 70–99)
Potassium: 4.4 mmol/L (ref 3.5–5.1)
Sodium: 140 mmol/L (ref 135–145)
Total Bilirubin: <0.2 mg/dL — ABNORMAL LOW (ref 0.3–1.2)
Total Protein: 7 g/dL (ref 6.5–8.1)

## 2019-04-07 LAB — SAMPLE TO BLOOD BANK

## 2019-04-07 NOTE — Progress Notes (Signed)
[place] OFFICE PROGRESS NOTE  Patient Care Team: Caryl Bis, MD as PCP - General (Family Medicine)  ASSESSMENT & PLAN:  Ovarian cancer [facility]) She complained of profound weakness after recent treatment According to blood work today, even though she has pancytopenia, she does not require transfusion support Her blood pressure is normal We will continue aggressive supportive care We discussed the timing of next imaging study I would like to give her 1 more dose of treatment at reduced dose in 3 weeks time before repeating CT next month She agreed with the plan of care  PE (pulmonary thromboembolism) ([place]) She has no recent signs of bleeding She will continue anticoagulation therapy  Pancytopenia, acquired ([place]) Her pancytopenia is not to the degree that she would need transfusion support Observe only  Other fatigue She complains of fatigue and generalized weakness Due to recent improvement of imaging study and Ca1 25, I recommend 1 more dose of treatment with reduced dose as before As above, she does not need transfusion support   No orders of the defined types were placed in this encounter.   INTERVAL HISTORY: Please see below for problem oriented charting. She returns for further follow-up I also spoke with her family over the telephone She complained of fatigue No recent infection, fever or chills The patient denies any recent signs or symptoms of bleeding such as spontaneous epistaxis, hematuria or hematochezia.   SUMMARY OF ONCOLOGIC HISTORY: Oncology History  Ovarian cancer ([place])  10/24/2018 Initial Diagnosis   Her symptoms began in April/May, 2020.  She has bloating and early satiety. Shortness of breath with walking. She denied bleeding. She reported constipation with pain with defecation and narrowed stools   11/29/2018 Imaging   1. 13 cm complex cystic lesion in the central pelvis, highly suspicious for ovarian cystadenocarcinoma. 2.  Diffuse peritoneal carcinomatosis with mild ascites. 3. Mild lymphadenopathy in porta hepatis and right cardiophrenic angle, suspicious for metastatic disease. 4. Moderate right and tiny left pleural effusions   12/05/2018 Tumor Marker   Patient's tumor was tested for the following markers: CA-125 Results of the tumor marker test revealed 1015.   12/06/2018 Cancer Staging   Staging form: Ovary, Fallopian Tube, and Primary Peritoneal Carcinoma, AJCC 8th Edition - Clinical: FIGO Stage IVA, calculated as Stage IV (cT3c, cN1, cM1) - Signed by [name], MD on 12/06/2018   12/09/2018 Pathology Results   PLEURAL FLUID, RIGHT (SPECIMEN 1 OF 1 COLLECTED 12/09/18): - MALIGNANT CELLS CONSISTENT WITH METASTATIC ADENOCARCINOMA - SEE COMMENT Comment The neoplastic cells are positive for cytokeratin 7 and Pax-8 but negative for cytokeratin 20, TTF-1, CDX-2 and Gata-3. Overall, the phenotype is consistent with the clinical impression of gynecologic primary.    12/15/2018 Procedure   Placement of single lumen port a cath via right internal jugular vein. The catheter tip lies at the cavo-atrial junction. A power injectable port a cath was placed and is ready for immediate use   12/16/2018 -  Chemotherapy   The patient had carboplatin and taxol for chemotherapy treatment.     01/06/2019 Tumor Marker   Patient's tumor was tested for the following markers: CA-125 Results of the tumor marker test revealed 948   02/02/2019 Imaging   1. Massive pulmonary embolism, as discussed above. Given the mildly elevated RV to LV ratio of 0.95, this is associated with increased risk of morbidity and mortality. 2. Today's study demonstrates a mixed response to therapy. Specifically, while there has been regression of the bulky intraperitoneal  metastatic disease and regression of previously noted pleural effusions, the large cystic mass in the central pelvis has increased in size compared to the prior study. 3. New right mild  hydroureteronephrosis related to extrinsic compression on the distal third of the right ureter by the patient's large pelvic mass. 4. Scattered small pulmonary nodules (predominantly pleural based) appear stable compared to prior examinations. These are nonspecific but warrant continued attention on follow-up studies. 5. Aortic atherosclerosis, in addition to three-vessel coronary artery disease. Assessment for potential risk factor modification, dietary therapy or pharmacologic therapy may be warranted, if clinically indicated.   02/02/2019 - 02/04/2019 Hospital Admission   She was admitted the hospital due to DVT and PE   02/03/2019 Imaging   Bilateral venous Doppler US Right: Findings consistent with acute deep vein thrombosis involving the right femoral vein, right popliteal vein, right peroneal veins, right soleal veins, and right gastrocnemius veins. No cystic structure found in the popliteal fossa. Left: There is no evidence of deep vein thrombosis in the lower extremity. No cystic structure found in the popliteal fossa   02/17/2019 Tumor Marker   Patient's tumor was tested for the following markers: CA-125 Results of the tumor marker test revealed 127   03/10/2019 Tumor Marker   Patient's tumor was tested for the following markers: CA-125. Results of the tumor marker test revealed 87.4   03/14/2019 - 03/16/2019 Hospital Admission   She was admitted to the hospital recently for weakness   03/31/2019 Tumor Marker   Patient's tumor was tested for the following markers: CA-125 Results of the tumor marker test revealed 59.2.     REVIEW OF SYSTEMS:   Constitutional: Denies fevers, chills or abnormal weight loss Eyes: Denies blurriness of vision Ears, nose, mouth, throat, and face: Denies mucositis or sore throat Respiratory: Denies cough, dyspnea or wheezes Cardiovascular: Denies palpitation, chest discomfort or lower extremity swelling Gastrointestinal:  Denies nausea, heartburn or change  in bowel habits Skin: Denies abnormal skin rashes Lymphatics: Denies new lymphadenopathy or easy bruising Neurological:Denies numbness, tingling or new weaknesses Behavioral/Psych: Mood is stable, no new changes  All other systems were reviewed with the patient and are negative.  I have reviewed the past medical history, past surgical history, social history and family history with the patient and they are unchanged from previous note.  ALLERGIES:  has No Known Allergies.  MEDICATIONS:  Current Outpatient Medications  Medication Sig Dispense Refill  . acetaminophen (TYLENOL) 325 MG tablet Take 650 mg by mouth every 6 (six) hours as needed for mild pain or headache.    Marland Kitchen apixaban (ELIQUIS) 5 MG TABS tablet Take 1 tablet (5 mg total) by mouth 2 (two) times daily. 60 tablet 11  . dexamethasone (DECADRON) 4 MG tablet Take 2 tabs at the night before and 2 tab the morning of chemotherapy, every 3 weeks, by mouth with food (Patient taking differently: Take 8 mg by mouth as directed. Take 8mg  at the night before and 8mg  the morning of chemotherapy, every 3 weeks, by mouth with food) 24 tablet 9  . famotidine (PEPCID) 40 MG tablet Take 40 mg by mouth 2 (two) times daily.    Marland Kitchen levothyroxine (SYNTHROID) 50 MCG tablet Take 1 tablet (50 mcg total) by mouth daily before breakfast.    . lidocaine-prilocaine (EMLA) cream Apply to affected area once (Patient taking differently: Apply 1 application topically as needed (port access). ) 30 g 3  . ondansetron (ZOFRAN) 8 MG tablet Take 1 tablet (8 mg total) by  mouth every 8 (eight) hours as needed for refractory nausea / vomiting. 30 tablet 1  . polyethylene glycol (MIRALAX / GLYCOLAX) 17 g packet Take 17 g by mouth daily.     . prochlorperazine (COMPAZINE) 10 MG tablet Take 1 tablet (10 mg total) by mouth every 6 (six) hours as needed (Nausea or vomiting). 30 tablet 1   No current facility-administered medications for this visit.     PHYSICAL  EXAMINATION: ECOG PERFORMANCE STATUS: 2 - Symptomatic, <50% confined to bed  Vitals:   04/07/19 1218  BP: 127/61  Pulse: 82  Resp: 18  Temp: 98 F (36.7 C)  SpO2: 99%   Filed Weights   04/07/19 1218  Weight: 211 lb 3.2 oz (95.8 kg)    GENERAL:alert, no distress and comfortable SKIN: skin color, texture, turgor are normal, no rashes or significant lesions EYES: normal, Conjunctiva are pink and non-injected, sclera clear OROPHARYNX:no exudate, no erythema and lips, buccal mucosa, and tongue normal  NECK: supple, thyroid normal size, non-tender, without nodularity LYMPH:  no palpable lymphadenopathy in the cervical, axillary or inguinal LUNGS: clear to auscultation and percussion with normal breathing effort HEART: regular rate & rhythm and no murmurs and no lower extremity edema ABDOMEN:abdomen soft, non-tender and normal bowel sounds Musculoskeletal:no cyanosis of digits and no clubbing  NEURO: alert & oriented x 3 with fluent speech, no focal motor/sensory deficits  LABORATORY DATA:  I have reviewed the data as listed    Component Value Date/Time   NA 140 04/07/2019 1131   K 4.4 04/07/2019 1131   CL 102 04/07/2019 1131   CO2 25 04/07/2019 1131   GLUCOSE 106 (H) 04/07/2019 1131   BUN 20 04/07/2019 1131   CREATININE 0.86 04/07/2019 1131   CALCIUM 9.5 04/07/2019 1131   PROT 7.0 04/07/2019 1131   ALBUMIN 3.4 (L) 04/07/2019 1131   AST 17 04/07/2019 1131   ALT 18 04/07/2019 1131   ALKPHOS 81 04/07/2019 1131   BILITOT <0.2 (L) 04/07/2019 1131   GFRNONAA >60 04/07/2019 1131   GFRAA >60 04/07/2019 1131    No results found for: SPEP, UPEP  Lab Results  Component Value Date   WBC 2.7 (L) 04/07/2019   NEUTROABS 0.8 (L) 04/07/2019   HGB 8.8 (L) 04/07/2019   HCT 26.0 (L) 04/07/2019   MCV 101.2 (H) 04/07/2019   PLT 140 (L) 04/07/2019      Chemistry      Component Value Date/Time   NA 140 04/07/2019 1131   K 4.4 04/07/2019 1131   CL 102 04/07/2019 1131   CO2 25  04/07/2019 1131   BUN 20 04/07/2019 1131   CREATININE 0.86 04/07/2019 1131      Component Value Date/Time   CALCIUM 9.5 04/07/2019 1131   ALKPHOS 81 04/07/2019 1131   AST 17 04/07/2019 1131   ALT 18 04/07/2019 1131   BILITOT <0.2 (L) 04/07/2019 1131       RADIOGRAPHIC STUDIES: I have personally reviewed the radiological images as listed and agreed with the findings in the report. Dg Chest 1 View  Result Date: 03/14/2019 CLINICAL DATA:  Chest heaviness for 2 days. EXAM: CHEST  1 VIEW COMPARISON:  Chest x-ray March 14, 2019 9 a.m. FINDINGS: Lateral view of the chest demonstrate clear lungs. Degenerative joint changes of the spine are identified. Central venous line is identified. IMPRESSION: No active disease. Electronically Signed   By: Abelardo Diesel M.D.   On: 03/14/2019 15:58   Ct Head Wo Contrast  Result Date:  03/14/2019 CLINICAL DATA:  Altered level of consciousness. EXAM: CT HEAD WITHOUT CONTRAST TECHNIQUE: Contiguous axial images were obtained from the base of the skull through the vertex without intravenous contrast. COMPARISON:  CT scan of April 09, 2013. FINDINGS: Brain: Mild chronic ischemic white matter disease is noted. No mass effect or midline shift is noted. Ventricular size is within normal limits. There is no evidence of mass lesion, hemorrhage or acute infarction. Vascular: No hyperdense vessel or unexpected calcification. Skull: Normal. Negative for fracture or focal lesion. Sinuses/Orbits: No acute finding. Other: None. IMPRESSION: Mild chronic ischemic white matter disease. No acute intracranial abnormality seen. Electronically Signed   By: Marijo Conception M.D.   On: 03/14/2019 12:59   Dg Chest Port 1 View  Result Date: 03/14/2019 CLINICAL DATA:  Shortness of breath. EXAM: PORTABLE CHEST 1 VIEW COMPARISON:  CT 02/02/2019. FINDINGS: PowerPort catheter noted with tip over cavoatrial junction. Heart size normal. No focal infiltrate. Mild left base subsegmental  atelectasis. No pleural effusion or pneumothorax. IMPRESSION: 1.  PowerPort catheter with tip over cavoatrial junction. 2.  Mild left base subsegmental atelectasis. Electronically Signed   By: Marcello Moores  Register   On: 03/14/2019 09:25    All questions were answered. The patient knows to call the clinic with any problems, questions or concerns. No barriers to learning was detected.  I spent 15 minutes counseling the patient face to face. The total time spent in the appointment was 20 minutes and more than 50% was on counseling and review of test results  [name], MD 04/07/2019 1:13 PM

## 2019-04-07 NOTE — Assessment & Plan Note (Signed)
Her pancytopenia is not to the degree that she would need transfusion support Observe only

## 2019-04-07 NOTE — Assessment & Plan Note (Signed)
She complained of profound weakness after recent treatment According to blood work today, even though she has pancytopenia, she does not require transfusion support Her blood pressure is normal We will continue aggressive supportive care We discussed the timing of next imaging study I would like to give her 1 more dose of treatment at reduced dose in 3 weeks time before repeating CT next month She agreed with the plan of care

## 2019-04-07 NOTE — Assessment & Plan Note (Signed)
She complains of fatigue and generalized weakness Due to recent improvement of imaging study and Ca1 25, I recommend 1 more dose of treatment with reduced dose as before As above, she does not need transfusion support

## 2019-04-07 NOTE — Assessment & Plan Note (Signed)
She has no recent signs of bleeding She will continue anticoagulation therapy

## 2019-04-10 ENCOUNTER — Telehealth: Payer: Self-pay | Admitting: Hematology and Oncology

## 2019-04-10 NOTE — Telephone Encounter (Signed)
I talk with patient regarding schedule  

## 2019-04-12 ENCOUNTER — Telehealth: Payer: Self-pay

## 2019-04-12 NOTE — Telephone Encounter (Signed)
-----   Message from [name], MD sent at 04/12/2019  8:34 AM EDT ----- Regarding: can you call her and ask how she is doing?

## 2019-04-12 NOTE — Telephone Encounter (Signed)
Called and given below message. She verbalized understanding. She is feeling better each day. She is still having some weakness when she walks around. She is walking around her house and doing house work. She is sleeping good and eating/ drinking with no problems. Instructed to call the office if needed. She verbalized understanding.

## 2019-04-28 ENCOUNTER — Other Ambulatory Visit: Payer: Self-pay

## 2019-04-28 ENCOUNTER — Telehealth: Payer: Self-pay | Admitting: Hematology and Oncology

## 2019-04-28 ENCOUNTER — Encounter: Payer: Self-pay | Admitting: Hematology and Oncology

## 2019-04-28 ENCOUNTER — Inpatient Hospital Stay: Payer: Medicare Other

## 2019-04-28 ENCOUNTER — Inpatient Hospital Stay (HOSPITAL_BASED_OUTPATIENT_CLINIC_OR_DEPARTMENT_OTHER): Payer: Medicare Other | Admitting: Hematology and Oncology

## 2019-04-28 VITALS — BP 141/75 | HR 89 | Temp 99.1°F | Resp 18 | Ht 67.0 in | Wt 213.1 lb

## 2019-04-28 DIAGNOSIS — R531 Weakness: Secondary | ICD-10-CM | POA: Diagnosis not present

## 2019-04-28 DIAGNOSIS — K5909 Other constipation: Secondary | ICD-10-CM | POA: Diagnosis not present

## 2019-04-28 DIAGNOSIS — Z5111 Encounter for antineoplastic chemotherapy: Secondary | ICD-10-CM | POA: Diagnosis not present

## 2019-04-28 DIAGNOSIS — G8929 Other chronic pain: Secondary | ICD-10-CM | POA: Diagnosis not present

## 2019-04-28 DIAGNOSIS — I2699 Other pulmonary embolism without acute cor pulmonale: Secondary | ICD-10-CM

## 2019-04-28 DIAGNOSIS — T451X5A Adverse effect of antineoplastic and immunosuppressive drugs, initial encounter: Secondary | ICD-10-CM

## 2019-04-28 DIAGNOSIS — D61818 Other pancytopenia: Secondary | ICD-10-CM

## 2019-04-28 DIAGNOSIS — C569 Malignant neoplasm of unspecified ovary: Secondary | ICD-10-CM

## 2019-04-28 DIAGNOSIS — G620 Drug-induced polyneuropathy: Secondary | ICD-10-CM | POA: Diagnosis not present

## 2019-04-28 DIAGNOSIS — R5383 Other fatigue: Secondary | ICD-10-CM | POA: Diagnosis not present

## 2019-04-28 DIAGNOSIS — Z7901 Long term (current) use of anticoagulants: Secondary | ICD-10-CM | POA: Diagnosis not present

## 2019-04-28 DIAGNOSIS — C786 Secondary malignant neoplasm of retroperitoneum and peritoneum: Secondary | ICD-10-CM | POA: Diagnosis not present

## 2019-04-28 DIAGNOSIS — M25559 Pain in unspecified hip: Secondary | ICD-10-CM | POA: Diagnosis not present

## 2019-04-28 DIAGNOSIS — Z79899 Other long term (current) drug therapy: Secondary | ICD-10-CM | POA: Diagnosis not present

## 2019-04-28 LAB — CBC WITH DIFFERENTIAL (CANCER CENTER ONLY)
Abs Immature Granulocytes: 0.02 10*3/uL (ref 0.00–0.07)
Basophils Absolute: 0 10*3/uL (ref 0.0–0.1)
Basophils Relative: 0 %
Eosinophils Absolute: 0 10*3/uL (ref 0.0–0.5)
Eosinophils Relative: 0 %
HCT: 29.8 % — ABNORMAL LOW (ref 36.0–46.0)
Hemoglobin: 9.9 g/dL — ABNORMAL LOW (ref 12.0–15.0)
Immature Granulocytes: 0 %
Lymphocytes Relative: 15 %
Lymphs Abs: 0.8 10*3/uL (ref 0.7–4.0)
MCH: 33.7 pg (ref 26.0–34.0)
MCHC: 33.2 g/dL (ref 30.0–36.0)
MCV: 101.4 fL — ABNORMAL HIGH (ref 80.0–100.0)
Monocytes Absolute: 0.2 10*3/uL (ref 0.1–1.0)
Monocytes Relative: 3 %
Neutro Abs: 4.2 10*3/uL (ref 1.7–7.7)
Neutrophils Relative %: 82 %
Platelet Count: 184 10*3/uL (ref 150–400)
RBC: 2.94 MIL/uL — ABNORMAL LOW (ref 3.87–5.11)
RDW: 16.9 % — ABNORMAL HIGH (ref 11.5–15.5)
WBC Count: 5.2 10*3/uL (ref 4.0–10.5)
nRBC: 0 % (ref 0.0–0.2)

## 2019-04-28 LAB — CMP (CANCER CENTER ONLY)
ALT: 21 U/L (ref 0–44)
AST: 18 U/L (ref 15–41)
Albumin: 3.5 g/dL (ref 3.5–5.0)
Alkaline Phosphatase: 96 U/L (ref 38–126)
Anion gap: 14 (ref 5–15)
BUN: 17 mg/dL (ref 8–23)
CO2: 22 mmol/L (ref 22–32)
Calcium: 10 mg/dL (ref 8.9–10.3)
Chloride: 103 mmol/L (ref 98–111)
Creatinine: 0.9 mg/dL (ref 0.44–1.00)
GFR, Est AFR Am: >60 mL/min (ref >60)
GFR, Estimated: >60 mL/min (ref >60)
Glucose, Bld: 181 mg/dL — ABNORMAL HIGH (ref 70–99)
Potassium: 3.8 mmol/L (ref 3.5–5.1)
Sodium: 139 mmol/L (ref 135–145)
Total Bilirubin: <0.2 mg/dL — ABNORMAL LOW (ref 0.3–1.2)
Total Protein: 7.2 g/dL (ref 6.5–8.1)

## 2019-04-28 LAB — SAMPLE TO BLOOD BANK

## 2019-04-28 MED ORDER — PALONOSETRON HCL INJECTION 0.25 MG/5ML
INTRAVENOUS | Status: AC
  Filled 2019-04-28: qty 5

## 2019-04-28 MED ORDER — SODIUM CHLORIDE 0.9% FLUSH
10.0000 mL | Freq: Once | INTRAVENOUS | Status: AC
  Administered 2019-04-28: 10 mL
  Filled 2019-04-28: qty 10

## 2019-04-28 MED ORDER — FAMOTIDINE IN NACL 20-0.9 MG/50ML-% IV SOLN
INTRAVENOUS | Status: AC
  Filled 2019-04-28: qty 50

## 2019-04-28 MED ORDER — SODIUM CHLORIDE 0.9 % IV SOLN
Freq: Once | INTRAVENOUS | Status: AC
  Administered 2019-04-28: 12:00:00 via INTRAVENOUS
  Filled 2019-04-28: qty 250

## 2019-04-28 MED ORDER — HEPARIN SOD (PORK) LOCK FLUSH 100 UNIT/ML IV SOLN
500.0000 [IU] | Freq: Once | INTRAVENOUS | Status: AC | PRN
  Administered 2019-04-28: 500 [IU]
  Filled 2019-04-28: qty 5

## 2019-04-28 MED ORDER — SODIUM CHLORIDE 0.9 % IV SOLN
491.5000 mg | Freq: Once | INTRAVENOUS | Status: AC
  Administered 2019-04-28: 490 mg via INTRAVENOUS
  Filled 2019-04-28: qty 49

## 2019-04-28 MED ORDER — DIPHENHYDRAMINE HCL 50 MG/ML IJ SOLN
INTRAMUSCULAR | Status: AC
  Filled 2019-04-28: qty 1

## 2019-04-28 MED ORDER — SODIUM CHLORIDE 0.9% FLUSH
10.0000 mL | INTRAVENOUS | Status: DC | PRN
  Administered 2019-04-28: 10 mL
  Filled 2019-04-28: qty 10

## 2019-04-28 MED ORDER — FAMOTIDINE IN NACL 20-0.9 MG/50ML-% IV SOLN
20.0000 mg | Freq: Once | INTRAVENOUS | Status: AC
  Administered 2019-04-28: 20 mg via INTRAVENOUS

## 2019-04-28 MED ORDER — DIPHENHYDRAMINE HCL 50 MG/ML IJ SOLN
50.0000 mg | Freq: Once | INTRAMUSCULAR | Status: AC
  Administered 2019-04-28: 50 mg via INTRAVENOUS

## 2019-04-28 MED ORDER — SODIUM CHLORIDE 0.9 % IV SOLN
116.6667 mg/m2 | Freq: Once | INTRAVENOUS | Status: AC
  Administered 2019-04-28: 252 mg via INTRAVENOUS
  Filled 2019-04-28: qty 42

## 2019-04-28 MED ORDER — PALONOSETRON HCL INJECTION 0.25 MG/5ML
0.2500 mg | Freq: Once | INTRAVENOUS | Status: AC
  Administered 2019-04-28: 11:00:00 0.25 mg via INTRAVENOUS

## 2019-04-28 MED ORDER — SODIUM CHLORIDE 0.9 % IV SOLN
Freq: Once | INTRAVENOUS | Status: AC
  Administered 2019-04-28: 12:00:00 via INTRAVENOUS
  Filled 2019-04-28: qty 5

## 2019-04-28 NOTE — Assessment & Plan Note (Signed)
She is doing well on anticoagulation therapy without bleeding I plan to order CT imaging next month to assess

## 2019-04-28 NOTE — Telephone Encounter (Signed)
Scheduled appt per 10/30 sch message - pt to get an updated schedule in the treatment area.   

## 2019-04-28 NOTE — Patient Instructions (Signed)
[facility] Discharge Instructions for Patients Receiving Chemotherapy  Today you received the following chemotherapy agents:  Taxol, Carboplatin  To help prevent nausea and vomiting after your treatment, we encourage you to take your nausea medication as prescribed.   If you develop nausea and vomiting that is not controlled by your nausea medication, call the clinic.   BELOW ARE SYMPTOMS THAT SHOULD BE REPORTED IMMEDIATELY:  *FEVER GREATER THAN 100.5 F  *CHILLS WITH OR WITHOUT FEVER  NAUSEA AND VOMITING THAT IS NOT CONTROLLED WITH YOUR NAUSEA MEDICATION  *UNUSUAL SHORTNESS OF BREATH  *UNUSUAL BRUISING OR BLEEDING  TENDERNESS IN MOUTH AND THROAT WITH OR WITHOUT PRESENCE OF ULCERS  *URINARY PROBLEMS  *BOWEL PROBLEMS  UNUSUAL RASH Items with * indicate a potential emergency and should be followed up as soon as possible.  Feel free to call the clinic should you have any questions or concerns. The clinic phone number is [phone].  Please show the CHEMO ALERT CARD at check-in to the Emergency Department and triage nurse.   

## 2019-04-28 NOTE — Assessment & Plan Note (Signed)
She has responded well to treatment I plan to complete 1 more cycle of treatment today She will have CT scan next week and I will see her back after that to review test results If she had favorable response to treatment, I will refer her back to GYN oncologist to consider surgery

## 2019-04-28 NOTE — Assessment & Plan Note (Signed)
Neuropathy is stable with dose adjustment We will proceed with chemotherapy as scheduled

## 2019-04-28 NOTE — Patient Instructions (Signed)

## 2019-04-28 NOTE — Progress Notes (Signed)
[facility] OFFICE PROGRESS NOTE  Patient Care Team: Caryl Bis, MD as PCP - General (Family Medicine)  ASSESSMENT & PLAN:  Ovarian cancer [facility]) She has responded well to treatment I plan to complete 1 more cycle of treatment today She will have CT scan next week and I will see her back after that to review test results If she had favorable response to treatment, I will refer her back to GYN oncologist to consider surgery  PE (pulmonary thromboembolism) ([place]) She is doing well on anticoagulation therapy without bleeding I plan to order CT imaging next month to assess  Peripheral neuropathy due to chemotherapy ([place]) Neuropathy is stable with dose adjustment We will proceed with chemotherapy as scheduled   Orders Placed This Encounter  Procedures  . CT ABDOMEN PELVIS W CONTRAST    Standing Status:   Future    Standing Expiration Date:   04/27/2020    Order Specific Question:   If indicated for the ordered procedure, I authorize the administration of contrast media per Radiology protocol    Answer:   Yes    Order Specific Question:   Preferred imaging location?    Answer:   [facility]    Order Specific Question:   Radiology Contrast Protocol - do NOT remove file path    Answer:   \\[email or web address]  . CT CHEST W CONTRAST    Standing Status:   Future    Standing Expiration Date:   04/27/2020    Order Specific Question:   If indicated for the ordered procedure, I authorize the administration of contrast media per Radiology protocol    Answer:   Yes    Order Specific Question:   Preferred imaging location?    Answer:   [facility]    Order Specific Question:   Radiology Contrast Protocol - do NOT remove file path    Answer:   \\[email or web address]    INTERVAL HISTORY: Please see below for problem oriented charting. She returns for chemotherapy and follow-up She is doing well Denies recent  chest pain or shortness of breath Appetite is stable The patient denies any recent signs or symptoms of bleeding such as spontaneous epistaxis, hematuria or hematochezia. Denies worsening peripheral neuropathy No recent infection, fever or chills SUMMARY OF ONCOLOGIC HISTORY: Oncology History  Ovarian cancer ([place])  10/24/2018 Initial Diagnosis   Her symptoms began in April/May, 2020.  She has bloating and early satiety. Shortness of breath with walking. She denied bleeding. She reported constipation with pain with defecation and narrowed stools   11/29/2018 Imaging   1. 13 cm complex cystic lesion in the central pelvis, highly suspicious for ovarian cystadenocarcinoma. 2. Diffuse peritoneal carcinomatosis with mild ascites. 3. Mild lymphadenopathy in porta hepatis and right cardiophrenic angle, suspicious for metastatic disease. 4. Moderate right and tiny left pleural effusions   12/05/2018 Tumor Marker   Patient's tumor was tested for the following markers: CA-125 Results of the tumor marker test revealed 1015.   12/06/2018 Cancer Staging   Staging form: Ovary, Fallopian Tube, and Primary Peritoneal Carcinoma, AJCC 8th Edition - Clinical: FIGO Stage IVA, calculated as Stage IV (cT3c, cN1, cM1) - Signed by [name], MD on 12/06/2018   12/09/2018 Pathology Results   PLEURAL FLUID, RIGHT (SPECIMEN 1 OF 1 COLLECTED 12/09/18): - MALIGNANT CELLS CONSISTENT WITH METASTATIC ADENOCARCINOMA - SEE COMMENT Comment The neoplastic cells are positive for cytokeratin 7 and Pax-8 but negative for cytokeratin 20, TTF-1, CDX-2 and Gata-3. Overall, the  phenotype is consistent with the clinical impression of gynecologic primary.    12/15/2018 Procedure   Placement of single lumen port a cath via right internal jugular vein. The catheter tip lies at the cavo-atrial junction. A power injectable port a cath was placed and is ready for immediate use   12/16/2018 -  Chemotherapy   The patient had carboplatin and  taxol for chemotherapy treatment.     01/06/2019 Tumor Marker   Patient's tumor was tested for the following markers: CA-125 Results of the tumor marker test revealed 948   02/02/2019 Imaging   1. Massive pulmonary embolism, as discussed above. Given the mildly elevated RV to LV ratio of 0.95, this is associated with increased risk of morbidity and mortality. 2. Today's study demonstrates a mixed response to therapy. Specifically, while there has been regression of the bulky intraperitoneal metastatic disease and regression of previously noted pleural effusions, the large cystic mass in the central pelvis has increased in size compared to the prior study. 3. New right mild hydroureteronephrosis related to extrinsic compression on the distal third of the right ureter by the patient's large pelvic mass. 4. Scattered small pulmonary nodules (predominantly pleural based) appear stable compared to prior examinations. These are nonspecific but warrant continued attention on follow-up studies. 5. Aortic atherosclerosis, in addition to three-vessel coronary artery disease. Assessment for potential risk factor modification, dietary therapy or pharmacologic therapy may be warranted, if clinically indicated.   02/02/2019 - 02/04/2019 Hospital Admission   She was admitted the hospital due to DVT and PE   02/03/2019 Imaging   Bilateral venous Doppler US Right: Findings consistent with acute deep vein thrombosis involving the right femoral vein, right popliteal vein, right peroneal veins, right soleal veins, and right gastrocnemius veins. No cystic structure found in the popliteal fossa. Left: There is no evidence of deep vein thrombosis in the lower extremity. No cystic structure found in the popliteal fossa   02/17/2019 Tumor Marker   Patient's tumor was tested for the following markers: CA-125 Results of the tumor marker test revealed 127   03/10/2019 Tumor Marker   Patient's tumor was tested for the following  markers: CA-125. Results of the tumor marker test revealed 87.4   03/14/2019 - 03/16/2019 Hospital Admission   She was admitted to the hospital recently for weakness   03/31/2019 Tumor Marker   Patient's tumor was tested for the following markers: CA-125 Results of the tumor marker test revealed 59.2.     REVIEW OF SYSTEMS:   Constitutional: Denies fevers, chills or abnormal weight loss Eyes: Denies blurriness of vision Ears, nose, mouth, throat, and face: Denies mucositis or sore throat Respiratory: Denies cough, dyspnea or wheezes Cardiovascular: Denies palpitation, chest discomfort or lower extremity swelling Gastrointestinal:  Denies nausea, heartburn or change in bowel habits Skin: Denies abnormal skin rashes Lymphatics: Denies new lymphadenopathy or easy bruising Neurological:Denies numbness, tingling or new weaknesses Behavioral/Psych: Mood is stable, no new changes  All other systems were reviewed with the patient and are negative.  I have reviewed the past medical history, past surgical history, social history and family history with the patient and they are unchanged from previous note.  ALLERGIES:  has No Known Allergies.  MEDICATIONS:  Current Outpatient Medications  Medication Sig Dispense Refill  . acetaminophen (TYLENOL) 325 MG tablet Take 650 mg by mouth every 6 (six) hours as needed for mild pain or headache.    Marland Kitchen apixaban (ELIQUIS) 5 MG TABS tablet Take 1 tablet (5 mg total)  by mouth 2 (two) times daily. 60 tablet 11  . dexamethasone (DECADRON) 4 MG tablet Take 2 tabs at the night before and 2 tab the morning of chemotherapy, every 3 weeks, by mouth with food (Patient taking differently: Take 8 mg by mouth as directed. Take 8mg  at the night before and 8mg  the morning of chemotherapy, every 3 weeks, by mouth with food) 24 tablet 9  . famotidine (PEPCID) 40 MG tablet Take 40 mg by mouth 2 (two) times daily.    Marland Kitchen levothyroxine (SYNTHROID) 50 MCG tablet Take 1 tablet  (50 mcg total) by mouth daily before breakfast.    . lidocaine-prilocaine (EMLA) cream Apply to affected area once (Patient taking differently: Apply 1 application topically as needed (port access). ) 30 g 3  . ondansetron (ZOFRAN) 8 MG tablet Take 1 tablet (8 mg total) by mouth every 8 (eight) hours as needed for refractory nausea / vomiting. 30 tablet 1  . polyethylene glycol (MIRALAX / GLYCOLAX) 17 g packet Take 17 g by mouth daily.     . prochlorperazine (COMPAZINE) 10 MG tablet Take 1 tablet (10 mg total) by mouth every 6 (six) hours as needed (Nausea or vomiting). 30 tablet 1   No current facility-administered medications for this visit.    Facility-Administered Medications Ordered in Other Visits  Medication Dose Route Frequency Provider Last Rate Last Dose  . CARBOplatin (PARAPLATIN) 490 mg in sodium chloride 0.9 % 250 mL chemo infusion  490 mg Intravenous Once Alvy Bimler, Ezrah Dembeck, MD      . fosaprepitant (EMEND) 150 mg, dexamethasone (DECADRON) 12 mg in sodium chloride 0.9 % 145 mL IVPB   Intravenous Once [name], MD 454 mL/hr at 04/28/19 1143    . heparin lock flush 100 unit/mL  500 Units Intracatheter Once PRN Alvy Bimler, Stran Raper, MD      . PACLitaxel (TAXOL) 252 mg in sodium chloride 0.9 % 250 mL chemo infusion (> 80mg /m2)  BB:[id number] mg/m2 (Treatment Plan Recorded) Intravenous Once Alvy Bimler, Shantay Sonn, MD      . sodium chloride flush (NS) 0.9 % injection 10 mL  10 mL Intracatheter PRN Alvy Bimler, Tavonna Worthington, MD        PHYSICAL EXAMINATION: ECOG PERFORMANCE STATUS: 1 - Symptomatic but completely ambulatory  Vitals:   04/28/19 1040  BP: (!) 141/75  Pulse: 89  Resp: 18  Temp: 99.1 F (37.3 C)  SpO2: 100%   Filed Weights   04/28/19 1040  Weight: 213 lb 1.6 oz (96.7 kg)    GENERAL:alert, no distress and comfortable SKIN: skin color, texture, turgor are normal, no rashes or significant lesions EYES: normal, Conjunctiva are pink and non-injected, sclera clear OROPHARYNX:no exudate, no erythema and lips,  buccal mucosa, and tongue normal  NECK: supple, thyroid normal size, non-tender, without nodularity LYMPH:  no palpable lymphadenopathy in the cervical, axillary or inguinal LUNGS: clear to auscultation and percussion with normal breathing effort HEART: regular rate & rhythm and no murmurs and no lower extremity edema ABDOMEN:abdomen soft, non-tender and normal bowel sounds Musculoskeletal:no cyanosis of digits and no clubbing  NEURO: alert & oriented x 3 with fluent speech, no focal motor/sensory deficits  LABORATORY DATA:  I have reviewed the data as listed    Component Value Date/Time   NA 139 04/28/2019 1010   K 3.8 04/28/2019 1010   CL 103 04/28/2019 1010   CO2 22 04/28/2019 1010   GLUCOSE 181 (H) 04/28/2019 1010   BUN 17 04/28/2019 1010   CREATININE 0.90 04/28/2019 1010   CALCIUM  10.0 04/28/2019 1010   PROT 7.2 04/28/2019 1010   ALBUMIN 3.5 04/28/2019 1010   AST 18 04/28/2019 1010   ALT 21 04/28/2019 1010   ALKPHOS 96 04/28/2019 1010   BILITOT <0.2 (L) 04/28/2019 1010   GFRNONAA >60 04/28/2019 1010   GFRAA >60 04/28/2019 1010    No results found for: SPEP, UPEP  Lab Results  Component Value Date   WBC 5.2 04/28/2019   NEUTROABS 4.2 04/28/2019   HGB 9.9 (L) 04/28/2019   HCT 29.8 (L) 04/28/2019   MCV 101.4 (H) 04/28/2019   PLT 184 04/28/2019      Chemistry      Component Value Date/Time   NA 139 04/28/2019 1010   K 3.8 04/28/2019 1010   CL 103 04/28/2019 1010   CO2 22 04/28/2019 1010   BUN 17 04/28/2019 1010   CREATININE 0.90 04/28/2019 1010      Component Value Date/Time   CALCIUM 10.0 04/28/2019 1010   ALKPHOS 96 04/28/2019 1010   AST 18 04/28/2019 1010   ALT 21 04/28/2019 1010   BILITOT <0.2 (L) 04/28/2019 1010       All questions were answered. The patient knows to call the clinic with any problems, questions or concerns. No barriers to learning was detected.  I spent 15 minutes counseling the patient face to face. The total time spent in the  appointment was 20 minutes and more than 50% was on counseling and review of test results  [name], MD 04/28/2019 11:52 AM

## 2019-04-29 LAB — CA 125: Cancer Antigen (CA) 125: 56.8 U/mL — ABNORMAL HIGH (ref 0.0–38.1)

## 2019-05-04 ENCOUNTER — Telehealth: Payer: Self-pay | Admitting: Oncology

## 2019-05-04 NOTE — Telephone Encounter (Signed)
Called Paula Peterson with appointment to see Dr. Denman George on 05/12/19 at 9:30.  She verbalized understanding and agreement.

## 2019-05-05 ENCOUNTER — Other Ambulatory Visit: Payer: Self-pay

## 2019-05-05 ENCOUNTER — Ambulatory Visit (HOSPITAL_COMMUNITY)
Admission: RE | Admit: 2019-05-05 | Discharge: 2019-05-05 | Disposition: A | Discharge status: Home / Self Care | Payer: Medicare Other | Source: Ambulatory Visit | Attending: Hematology and Oncology | Admitting: Hematology and Oncology

## 2019-05-05 DIAGNOSIS — C569 Malignant neoplasm of unspecified ovary: Secondary | ICD-10-CM | POA: Insufficient documentation

## 2019-05-05 DIAGNOSIS — I2699 Other pulmonary embolism without acute cor pulmonale: Secondary | ICD-10-CM | POA: Diagnosis not present

## 2019-05-05 DIAGNOSIS — J984 Other disorders of lung: Secondary | ICD-10-CM | POA: Diagnosis not present

## 2019-05-05 DIAGNOSIS — R918 Other nonspecific abnormal finding of lung field: Secondary | ICD-10-CM | POA: Insufficient documentation

## 2019-05-05 DIAGNOSIS — K769 Liver disease, unspecified: Secondary | ICD-10-CM | POA: Insufficient documentation

## 2019-05-05 DIAGNOSIS — N281 Cyst of kidney, acquired: Secondary | ICD-10-CM | POA: Diagnosis not present

## 2019-05-05 MED ORDER — IOHEXOL 300 MG/ML  SOLN
100.0000 mL | Freq: Once | INTRAMUSCULAR | Status: AC | PRN
  Administered 2019-05-05: 100 mL via INTRAVENOUS

## 2019-05-05 MED ORDER — SODIUM CHLORIDE (PF) 0.9 % IJ SOLN
INTRAMUSCULAR | Status: AC
  Filled 2019-05-05: qty 50

## 2019-05-05 MED FILL — ELIQUIS 5 MG TABLET: 5 | 30 days supply | Qty: 60 | Fill #2

## 2019-05-08 ENCOUNTER — Other Ambulatory Visit: Payer: Self-pay

## 2019-05-08 ENCOUNTER — Inpatient Hospital Stay: Payer: Medicare Other

## 2019-05-08 ENCOUNTER — Inpatient Hospital Stay: Payer: Medicare Other | Attending: Gynecologic Oncology | Admitting: Hematology and Oncology

## 2019-05-08 DIAGNOSIS — K219 Gastro-esophageal reflux disease without esophagitis: Secondary | ICD-10-CM | POA: Diagnosis not present

## 2019-05-08 DIAGNOSIS — I2699 Other pulmonary embolism without acute cor pulmonale: Secondary | ICD-10-CM | POA: Insufficient documentation

## 2019-05-08 DIAGNOSIS — I10 Essential (primary) hypertension: Secondary | ICD-10-CM | POA: Diagnosis not present

## 2019-05-08 DIAGNOSIS — R5383 Other fatigue: Secondary | ICD-10-CM | POA: Insufficient documentation

## 2019-05-08 DIAGNOSIS — Z8673 Personal history of transient ischemic attack (TIA), and cerebral infarction without residual deficits: Secondary | ICD-10-CM | POA: Diagnosis not present

## 2019-05-08 DIAGNOSIS — E039 Hypothyroidism, unspecified: Secondary | ICD-10-CM

## 2019-05-08 DIAGNOSIS — Z7901 Long term (current) use of anticoagulants: Secondary | ICD-10-CM | POA: Diagnosis not present

## 2019-05-08 DIAGNOSIS — C569 Malignant neoplasm of unspecified ovary: Secondary | ICD-10-CM

## 2019-05-08 DIAGNOSIS — D61818 Other pancytopenia: Secondary | ICD-10-CM | POA: Diagnosis not present

## 2019-05-08 DIAGNOSIS — T451X5A Adverse effect of antineoplastic and immunosuppressive drugs, initial encounter: Secondary | ICD-10-CM | POA: Insufficient documentation

## 2019-05-08 DIAGNOSIS — Z79899 Other long term (current) drug therapy: Secondary | ICD-10-CM | POA: Insufficient documentation

## 2019-05-08 DIAGNOSIS — G620 Drug-induced polyneuropathy: Secondary | ICD-10-CM | POA: Insufficient documentation

## 2019-05-08 DIAGNOSIS — C786 Secondary malignant neoplasm of retroperitoneum and peritoneum: Secondary | ICD-10-CM | POA: Insufficient documentation

## 2019-05-08 LAB — CMP (CANCER CENTER ONLY)
ALT: 21 U/L (ref 0–44)
AST: 16 U/L (ref 15–41)
Albumin: 3.7 g/dL (ref 3.5–5.0)
Alkaline Phosphatase: 74 U/L (ref 38–126)
Anion gap: 11 (ref 5–15)
BUN: 15 mg/dL (ref 8–23)
CO2: 25 mmol/L (ref 22–32)
Calcium: 9.5 mg/dL (ref 8.9–10.3)
Chloride: 103 mmol/L (ref 98–111)
Creatinine: 0.96 mg/dL (ref 0.44–1.00)
GFR, Est AFR Am: >60 mL/min (ref >60)
GFR, Estimated: 57 mL/min — ABNORMAL LOW (ref >60)
Glucose, Bld: 114 mg/dL — ABNORMAL HIGH (ref 70–99)
Potassium: 4 mmol/L (ref 3.5–5.1)
Sodium: 139 mmol/L (ref 135–145)
Total Bilirubin: <0.2 mg/dL — ABNORMAL LOW (ref 0.3–1.2)
Total Protein: 7 g/dL (ref 6.5–8.1)

## 2019-05-08 LAB — CBC WITH DIFFERENTIAL (CANCER CENTER ONLY)
Abs Immature Granulocytes: 0 10*3/uL (ref 0.00–0.07)
Basophils Absolute: 0 10*3/uL (ref 0.0–0.1)
Basophils Relative: 0 %
Eosinophils Absolute: 0 10*3/uL (ref 0.0–0.5)
Eosinophils Relative: 0 %
HCT: 27.1 % — ABNORMAL LOW (ref 36.0–46.0)
Hemoglobin: 9.1 g/dL — ABNORMAL LOW (ref 12.0–15.0)
Immature Granulocytes: 0 %
Lymphocytes Relative: 61 %
Lymphs Abs: 1.5 10*3/uL (ref 0.7–4.0)
MCH: 35 pg — ABNORMAL HIGH (ref 26.0–34.0)
MCHC: 33.6 g/dL (ref 30.0–36.0)
MCV: 104.2 fL — ABNORMAL HIGH (ref 80.0–100.0)
Monocytes Absolute: 0.3 10*3/uL (ref 0.1–1.0)
Monocytes Relative: 10 %
Neutro Abs: 0.7 10*3/uL — ABNORMAL LOW (ref 1.7–7.7)
Neutrophils Relative %: 29 %
Platelet Count: 117 10*3/uL — ABNORMAL LOW (ref 150–400)
RBC: 2.6 MIL/uL — ABNORMAL LOW (ref 3.87–5.11)
RDW: 16.3 % — ABNORMAL HIGH (ref 11.5–15.5)
WBC Count: 2.5 10*3/uL — ABNORMAL LOW (ref 4.0–10.5)
nRBC: 0 % (ref 0.0–0.2)

## 2019-05-08 LAB — SAMPLE TO BLOOD BANK

## 2019-05-08 LAB — TSH: TSH: 2.727 u[IU]/mL (ref 0.308–3.960)

## 2019-05-08 MED ORDER — SODIUM CHLORIDE 0.9% FLUSH
10.0000 mL | Freq: Once | INTRAVENOUS | Status: AC
  Administered 2019-05-08: 10 mL
  Filled 2019-05-08: qty 10

## 2019-05-08 NOTE — Patient Instructions (Signed)

## 2019-05-09 ENCOUNTER — Encounter: Payer: Self-pay | Admitting: Hematology and Oncology

## 2019-05-09 NOTE — Assessment & Plan Note (Signed)
She has completed approximately 3 months worth of anticoagulation therapy Majority of the clot has resolved with mild residual right lower lobe blood clots that I suspect will continue to improve She has no bleeding complications When she is ready to proceed with surgery, we will hold anticoagulation therapy She does not need bridging treatment

## 2019-05-09 NOTE — Assessment & Plan Note (Signed)
I have reviewed recent tumor marker and CT imaging with the patient She has positive response to treatment Her ovarian mass has reduced significantly She has completed numerous cycles of preoperative/neoadjuvant chemotherapy She has appointment to meet with GYN oncologist at the end of the week to discuss whether it is appropriate to proceed with interval debulking surgery

## 2019-05-09 NOTE — Assessment & Plan Note (Signed)
She has persistent fatigue due to recent chemotherapy She does not need transfusion support Observe only

## 2019-05-09 NOTE — Progress Notes (Signed)
[place] OFFICE PROGRESS NOTE  Patient Care Team: Caryl Bis, MD as PCP - General (Family Medicine)  ASSESSMENT & PLAN:  Ovarian cancer [facility]) I have reviewed recent tumor marker and CT imaging with the patient She has positive response to treatment Her ovarian mass has reduced significantly She has completed numerous cycles of preoperative/neoadjuvant chemotherapy She has appointment to meet with GYN oncologist at the end of the week to discuss whether it is appropriate to proceed with interval debulking surgery  Pancytopenia, acquired [facility]) She has persistent pancytopenia due to recent chemotherapy We discussed neutropenic precaution She does not need transfusion support  PE (pulmonary thromboembolism) ([place]) She has completed approximately 3 months worth of anticoagulation therapy Majority of the clot has resolved with mild residual right lower lobe blood clots that I suspect will continue to improve She has no bleeding complications When she is ready to proceed with surgery, we will hold anticoagulation therapy She does not need bridging treatment  Other fatigue She has persistent fatigue due to recent chemotherapy She does not need transfusion support Observe only  Peripheral neuropathy due to chemotherapy [facility]) She denies worsening neuropathy from recent treatment   No orders of the defined types were placed in this encounter.   INTERVAL HISTORY: Please see below for problem oriented charting. She returns to review test results I also collaborated the history with her family over the phone She is doing well overall She complained of fatigue after recent chemotherapy but denies chest pain or shortness of breath No worsening peripheral neuropathy Denies recent nausea or constipation The patient denies any recent signs or symptoms of bleeding such as spontaneous epistaxis, hematuria or hematochezia.   SUMMARY OF ONCOLOGIC HISTORY: Oncology  History  Ovarian cancer ([place])  10/24/2018 Initial Diagnosis   Her symptoms began in April/May, 2020.  She has bloating and early satiety. Shortness of breath with walking. She denied bleeding. She reported constipation with pain with defecation and narrowed stools   11/29/2018 Imaging   1. 13 cm complex cystic lesion in the central pelvis, highly suspicious for ovarian cystadenocarcinoma. 2. Diffuse peritoneal carcinomatosis with mild ascites. 3. Mild lymphadenopathy in porta hepatis and right cardiophrenic angle, suspicious for metastatic disease. 4. Moderate right and tiny left pleural effusions   12/05/2018 Tumor Marker   Patient's tumor was tested for the following markers: CA-125 Results of the tumor marker test revealed 1015.   12/06/2018 Cancer Staging   Staging form: Ovary, Fallopian Tube, and Primary Peritoneal Carcinoma, AJCC 8th Edition - Clinical: FIGO Stage IVA, calculated as Stage IV (cT3c, cN1, cM1) - Signed by [name], MD on 12/06/2018   12/09/2018 Pathology Results   PLEURAL FLUID, RIGHT (SPECIMEN 1 OF 1 COLLECTED 12/09/18): - MALIGNANT CELLS CONSISTENT WITH METASTATIC ADENOCARCINOMA - SEE COMMENT Comment The neoplastic cells are positive for cytokeratin 7 and Pax-8 but negative for cytokeratin 20, TTF-1, CDX-2 and Gata-3. Overall, the phenotype is consistent with the clinical impression of gynecologic primary.    12/15/2018 Procedure   Placement of single lumen port a cath via right internal jugular vein. The catheter tip lies at the cavo-atrial junction. A power injectable port a cath was placed and is ready for immediate use   12/16/2018 -  Chemotherapy   The patient had carboplatin and taxol for chemotherapy treatment.     01/06/2019 Tumor Marker   Patient's tumor was tested for the following markers: CA-125 Results of the tumor marker test revealed 948   02/02/2019 Imaging  1. Massive pulmonary embolism, as discussed above. Given the mildly elevated RV to LV ratio of  0.95, this is associated with increased risk of morbidity and mortality. 2. Today's study demonstrates a mixed response to therapy. Specifically, while there has been regression of the bulky intraperitoneal metastatic disease and regression of previously noted pleural effusions, the large cystic mass in the central pelvis has increased in size compared to the prior study. 3. New right mild hydroureteronephrosis related to extrinsic compression on the distal third of the right ureter by the patient's large pelvic mass. 4. Scattered small pulmonary nodules (predominantly pleural based) appear stable compared to prior examinations. These are nonspecific but warrant continued attention on follow-up studies. 5. Aortic atherosclerosis, in addition to three-vessel coronary artery disease. Assessment for potential risk factor modification, dietary therapy or pharmacologic therapy may be warranted, if clinically indicated.   02/02/2019 - 02/04/2019 Hospital Admission   She was admitted the hospital due to DVT and PE   02/03/2019 Imaging   Bilateral venous Doppler US Right: Findings consistent with acute deep vein thrombosis involving the right femoral vein, right popliteal vein, right peroneal veins, right soleal veins, and right gastrocnemius veins. No cystic structure found in the popliteal fossa. Left: There is no evidence of deep vein thrombosis in the lower extremity. No cystic structure found in the popliteal fossa   02/17/2019 Tumor Marker   Patient's tumor was tested for the following markers: CA-125 Results of the tumor marker test revealed 127   03/10/2019 Tumor Marker   Patient's tumor was tested for the following markers: CA-125. Results of the tumor marker test revealed 87.4   03/14/2019 - 03/16/2019 Hospital Admission   She was admitted to the hospital recently for weakness   03/31/2019 Tumor Marker   Patient's tumor was tested for the following markers: CA-125 Results of the tumor marker test  revealed 59.2.   04/28/2019 Tumor Marker   Patient's tumor was tested for the following markers: CA-125 Results of the tumor marker test revealed 56.8   05/05/2019 Imaging   1. Interval decrease in size of the large cystic mass in the central pelvis. Mesenteric and omental soft tissue disease shows no substantial interval change. 2. The mild right hydroureteronephrosis seen previously has resolved in the interval. 3. Small residual nonobstructive thrombus identified in the inter lobar pulmonary artery common here into the lateral wall compatible with chronicity. 4. 14 mm subtle enhancing lesion in the anterior right liver is stable. This may be vascular malformation. Attention on follow-up recommended. 5. Stable appearance of the multiple small bilateral pulmonary nodules. Continued attention on follow-up recommended. 6.  Aortic Atherosclerois (ICD10-170.0)       REVIEW OF SYSTEMS:   Constitutional: Denies fevers, chills or abnormal weight loss Eyes: Denies blurriness of vision Ears, nose, mouth, throat, and face: Denies mucositis or sore throat Respiratory: Denies cough, dyspnea or wheezes Cardiovascular: Denies palpitation, chest discomfort or lower extremity swelling Gastrointestinal:  Denies nausea, heartburn or change in bowel habits Skin: Denies abnormal skin rashes Lymphatics: Denies new lymphadenopathy or easy bruising Neurological:Denies numbness, tingling or new weaknesses Behavioral/Psych: Mood is stable, no new changes  All other systems were reviewed with the patient and are negative.  I have reviewed the past medical history, past surgical history, social history and family history with the patient and they are unchanged from previous note.  ALLERGIES:  has No Known Allergies.  MEDICATIONS:  Current Outpatient Medications  Medication Sig Dispense Refill  . acetaminophen (TYLENOL) 325 MG tablet  Take 650 mg by mouth every 6 (six) hours as needed for mild pain or  headache.    Marland Kitchen apixaban (ELIQUIS) 5 MG TABS tablet Take 1 tablet (5 mg total) by mouth 2 (two) times daily. 60 tablet 11  . famotidine (PEPCID) 40 MG tablet Take 40 mg by mouth 2 (two) times daily.    Marland Kitchen levothyroxine (SYNTHROID) 50 MCG tablet Take 1 tablet (50 mcg total) by mouth daily before breakfast.    . lidocaine-prilocaine (EMLA) cream Apply to affected area once (Patient taking differently: Apply 1 application topically as needed (port access). ) 30 g 3  . ondansetron (ZOFRAN) 8 MG tablet Take 1 tablet (8 mg total) by mouth every 8 (eight) hours as needed for refractory nausea / vomiting. 30 tablet 1  . polyethylene glycol (MIRALAX / GLYCOLAX) 17 g packet Take 17 g by mouth daily.     . prochlorperazine (COMPAZINE) 10 MG tablet Take 1 tablet (10 mg total) by mouth every 6 (six) hours as needed (Nausea or vomiting). 30 tablet 1   No current facility-administered medications for this visit.     PHYSICAL EXAMINATION: ECOG PERFORMANCE STATUS: 2 - Symptomatic, <50% confined to bed  Vitals:   05/08/19 1249  BP: (!) 133/51  Pulse: 87  Resp: 18  Temp: 98.3 F (36.8 C)  SpO2: 100%   Filed Weights   05/08/19 1249  Weight: 214 lb 1.6 oz (97.1 kg)    GENERAL:alert, no distress and comfortable Musculoskeletal:no cyanosis of digits and no clubbing  NEURO: alert & oriented x 3 with fluent speech, no focal motor/sensory deficits  LABORATORY DATA:  I have reviewed the data as listed    Component Value Date/Time   NA 139 05/08/2019 1136   K 4.0 05/08/2019 1136   CL 103 05/08/2019 1136   CO2 25 05/08/2019 1136   GLUCOSE 114 (H) 05/08/2019 1136   BUN 15 05/08/2019 1136   CREATININE 0.96 05/08/2019 1136   CALCIUM 9.5 05/08/2019 1136   PROT 7.0 05/08/2019 1136   ALBUMIN 3.7 05/08/2019 1136   AST 16 05/08/2019 1136   ALT 21 05/08/2019 1136   ALKPHOS 74 05/08/2019 1136   BILITOT <0.2 (L) 05/08/2019 1136   GFRNONAA 57 (L) 05/08/2019 1136   GFRAA >60 05/08/2019 1136    No results  found for: SPEP, UPEP  Lab Results  Component Value Date   WBC 2.5 (L) 05/08/2019   NEUTROABS 0.7 (L) 05/08/2019   HGB 9.1 (L) 05/08/2019   HCT 27.1 (L) 05/08/2019   MCV 104.2 (H) 05/08/2019   PLT 117 (L) 05/08/2019      Chemistry      Component Value Date/Time   NA 139 05/08/2019 1136   K 4.0 05/08/2019 1136   CL 103 05/08/2019 1136   CO2 25 05/08/2019 1136   BUN 15 05/08/2019 1136   CREATININE 0.96 05/08/2019 1136      Component Value Date/Time   CALCIUM 9.5 05/08/2019 1136   ALKPHOS 74 05/08/2019 1136   AST 16 05/08/2019 1136   ALT 21 05/08/2019 1136   BILITOT <0.2 (L) 05/08/2019 1136       RADIOGRAPHIC STUDIES: I have reviewed multiple imaging studies with the patient I have personally reviewed the radiological images as listed and agreed with the findings in the report. Ct Chest W Contrast  Result Date: 05/05/2019 CLINICAL DATA:  Ovarian cancer. EXAM: CT CHEST, ABDOMEN, AND PELVIS WITH CONTRAST TECHNIQUE: Multidetector CT imaging of the chest, abdomen and pelvis was performed  following the standard protocol during bolus administration of intravenous contrast. CONTRAST:  175mL OMNIPAQUE IOHEXOL 300 MG/ML  SOLN COMPARISON:  02/02/2019 FINDINGS: CT CHEST FINDINGS Cardiovascular: The heart size is normal. No substantial pericardial effusion. Coronary artery calcification is evident. Atherosclerotic calcification is noted in the wall of the thoracic aorta. Right Port-A-Cath tip is positioned in the upper right atrium. Residual pulmonary embolus is seen in the inter lobar pulmonary artery, nonobstructive and adherent to 1 wall suggesting chronicity. This is likely residua of the pulmonary embolic disease seen on the previous study. No other evidence of pulmonary embolism on today's exam. Mediastinum/Nodes: No mediastinal lymphadenopathy. No evidence for gross hilar lymphadenopathy although assessment is limited by the lack of intravenous contrast on today's study. The esophagus has  normal imaging features. There is no axillary lymphadenopathy. Lungs/Pleura: Mild biapical pleuroparenchymal scarring. Calcified granuloma noted posterior right upper lobe. Tiny scattered bilateral pulmonary nodules, most of which are subpleural/perifissural, are similar in the interval. No new suspicious pulmonary nodule or mass. Stable lingular scarring. No focal airspace consolidation. No pleural effusion. Musculoskeletal: No worrisome lytic or sclerotic osseous abnormality. CT ABDOMEN PELVIS FINDINGS Hepatobiliary: Punctate hypodensity in the dome of the liver is stable. Subtle 1.4 cm enhancing lesion in the anterior right liver (image 63/series 2) is stable since prior study. There is no evidence for gallstones, gallbladder wall thickening, or pericholecystic fluid. No intrahepatic or extrahepatic biliary dilation. Pancreas: No focal mass lesion. No dilatation of the main duct. No intraparenchymal cyst. No peripancreatic edema. Spleen: No splenomegaly. No focal mass lesion. Adrenals/Urinary Tract: No adrenal nodule or mass. Right kidney unremarkable. Central sinus cysts noted in the left kidney. No evidence for hydroureter. Bladder is decompressed. Stomach/Bowel: Stomach is distended with food and contrast material. Duodenum is normally positioned as is the ligament of Treitz. No small bowel wall thickening. No small bowel dilatation. No gross colonic mass. No colonic wall thickening. Vascular/Lymphatic: There is abdominal aortic atherosclerosis without aneurysm. There is no gastrohepatic or hepatoduodenal ligament lymphadenopathy. No intraperitoneal or retroperitoneal lymphadenopathy. No pelvic sidewall lymphadenopathy. Reproductive: The uterus is surgically absent. Hormone ring or pessary noted in the vagina. There is no adnexal mass. Other: No intraperitoneal free fluid. The peritoneal disease identified previously is similar in the interval. A dominant bandlike focus of abnormal soft tissue in the right  mesentery measures 7.9 x 1.0 cm today (93/2) which compares 2 8.8 x 1.3 cm when remeasured in a similar fashion on the prior study. Ill-defined nodular soft tissue in the right omentum (89/2) is similar to prior. Other areas of ill-defined nodularity in the omentum are similar (79/2). Gastrocolic ligament nodule on 84/2 is stable to minimally smaller in the interval. Dominant cystic mass in the central pelvis has clearly decreased in the interval measuring 11.7 x 8.2 x 8.1 cm today compared to 15.5 x 11.4 x 12.0 cm when remeasured in a similar fashion on the previous exam. Musculoskeletal: No worrisome lytic or sclerotic osseous abnormality. IMPRESSION: 1. Interval decrease in size of the large cystic mass in the central pelvis. Mesenteric and omental soft tissue disease shows no substantial interval change. 2. The mild right hydroureteronephrosis seen previously has resolved in the interval. 3. Small residual nonobstructive thrombus identified in the inter lobar pulmonary artery common here into the lateral wall compatible with chronicity. 4. 14 mm subtle enhancing lesion in the anterior right liver is stable. This may be vascular malformation. Attention on follow-up recommended. 5. Stable appearance of the multiple small bilateral pulmonary nodules. Continued attention  on follow-up recommended. 6.  Aortic Atherosclerois (ICD10-170.0) Electronically Signed   By: Misty Stanley M.D.   On: 05/05/2019 15:45   Ct Abdomen Pelvis W Contrast  Result Date: 05/05/2019 CLINICAL DATA:  Ovarian cancer. EXAM: CT CHEST, ABDOMEN, AND PELVIS WITH CONTRAST TECHNIQUE: Multidetector CT imaging of the chest, abdomen and pelvis was performed following the standard protocol during bolus administration of intravenous contrast. CONTRAST:  170mL OMNIPAQUE IOHEXOL 300 MG/ML  SOLN COMPARISON:  02/02/2019 FINDINGS: CT CHEST FINDINGS Cardiovascular: The heart size is normal. No substantial pericardial effusion. Coronary artery calcification  is evident. Atherosclerotic calcification is noted in the wall of the thoracic aorta. Right Port-A-Cath tip is positioned in the upper right atrium. Residual pulmonary embolus is seen in the inter lobar pulmonary artery, nonobstructive and adherent to 1 wall suggesting chronicity. This is likely residua of the pulmonary embolic disease seen on the previous study. No other evidence of pulmonary embolism on today's exam. Mediastinum/Nodes: No mediastinal lymphadenopathy. No evidence for gross hilar lymphadenopathy although assessment is limited by the lack of intravenous contrast on today's study. The esophagus has normal imaging features. There is no axillary lymphadenopathy. Lungs/Pleura: Mild biapical pleuroparenchymal scarring. Calcified granuloma noted posterior right upper lobe. Tiny scattered bilateral pulmonary nodules, most of which are subpleural/perifissural, are similar in the interval. No new suspicious pulmonary nodule or mass. Stable lingular scarring. No focal airspace consolidation. No pleural effusion. Musculoskeletal: No worrisome lytic or sclerotic osseous abnormality. CT ABDOMEN PELVIS FINDINGS Hepatobiliary: Punctate hypodensity in the dome of the liver is stable. Subtle 1.4 cm enhancing lesion in the anterior right liver (image 63/series 2) is stable since prior study. There is no evidence for gallstones, gallbladder wall thickening, or pericholecystic fluid. No intrahepatic or extrahepatic biliary dilation. Pancreas: No focal mass lesion. No dilatation of the main duct. No intraparenchymal cyst. No peripancreatic edema. Spleen: No splenomegaly. No focal mass lesion. Adrenals/Urinary Tract: No adrenal nodule or mass. Right kidney unremarkable. Central sinus cysts noted in the left kidney. No evidence for hydroureter. Bladder is decompressed. Stomach/Bowel: Stomach is distended with food and contrast material. Duodenum is normally positioned as is the ligament of Treitz. No small bowel wall  thickening. No small bowel dilatation. No gross colonic mass. No colonic wall thickening. Vascular/Lymphatic: There is abdominal aortic atherosclerosis without aneurysm. There is no gastrohepatic or hepatoduodenal ligament lymphadenopathy. No intraperitoneal or retroperitoneal lymphadenopathy. No pelvic sidewall lymphadenopathy. Reproductive: The uterus is surgically absent. Hormone ring or pessary noted in the vagina. There is no adnexal mass. Other: No intraperitoneal free fluid. The peritoneal disease identified previously is similar in the interval. A dominant bandlike focus of abnormal soft tissue in the right mesentery measures 7.9 x 1.0 cm today (93/2) which compares 2 8.8 x 1.3 cm when remeasured in a similar fashion on the prior study. Ill-defined nodular soft tissue in the right omentum (89/2) is similar to prior. Other areas of ill-defined nodularity in the omentum are similar (79/2). Gastrocolic ligament nodule on 84/2 is stable to minimally smaller in the interval. Dominant cystic mass in the central pelvis has clearly decreased in the interval measuring 11.7 x 8.2 x 8.1 cm today compared to 15.5 x 11.4 x 12.0 cm when remeasured in a similar fashion on the previous exam. Musculoskeletal: No worrisome lytic or sclerotic osseous abnormality. IMPRESSION: 1. Interval decrease in size of the large cystic mass in the central pelvis. Mesenteric and omental soft tissue disease shows no substantial interval change. 2. The mild right hydroureteronephrosis seen previously has resolved  in the interval. 3. Small residual nonobstructive thrombus identified in the inter lobar pulmonary artery common here into the lateral wall compatible with chronicity. 4. 14 mm subtle enhancing lesion in the anterior right liver is stable. This may be vascular malformation. Attention on follow-up recommended. 5. Stable appearance of the multiple small bilateral pulmonary nodules. Continued attention on follow-up recommended. 6.   Aortic Atherosclerois (ICD10-170.0) Electronically Signed   By: Misty Stanley M.D.   On: 05/05/2019 15:45    All questions were answered. The patient knows to call the clinic with any problems, questions or concerns. No barriers to learning was detected.  I spent 25 minutes counseling the patient face to face. The total time spent in the appointment was 30 minutes and more than 50% was on counseling and review of test results  [name], MD 05/09/2019 8:21 AM

## 2019-05-09 NOTE — Assessment & Plan Note (Signed)
She has persistent pancytopenia due to recent chemotherapy We discussed neutropenic precaution She does not need transfusion support

## 2019-05-09 NOTE — Assessment & Plan Note (Signed)
She denies worsening neuropathy from recent treatment

## 2019-05-12 ENCOUNTER — Inpatient Hospital Stay (HOSPITAL_BASED_OUTPATIENT_CLINIC_OR_DEPARTMENT_OTHER): Payer: Medicare Other | Admitting: Gynecologic Oncology

## 2019-05-12 ENCOUNTER — Encounter: Payer: Self-pay | Admitting: Gynecologic Oncology

## 2019-05-12 ENCOUNTER — Other Ambulatory Visit: Payer: Self-pay | Admitting: Gynecologic Oncology

## 2019-05-12 ENCOUNTER — Other Ambulatory Visit: Payer: Self-pay

## 2019-05-12 VITALS — BP 130/64 | HR 95 | Temp 98.3°F | Resp 18 | Ht 67.0 in | Wt 214.0 lb

## 2019-05-12 DIAGNOSIS — C786 Secondary malignant neoplasm of retroperitoneum and peritoneum: Secondary | ICD-10-CM | POA: Diagnosis not present

## 2019-05-12 DIAGNOSIS — C569 Malignant neoplasm of unspecified ovary: Secondary | ICD-10-CM

## 2019-05-12 DIAGNOSIS — Z7901 Long term (current) use of anticoagulants: Secondary | ICD-10-CM | POA: Diagnosis not present

## 2019-05-12 DIAGNOSIS — I2699 Other pulmonary embolism without acute cor pulmonale: Secondary | ICD-10-CM | POA: Diagnosis not present

## 2019-05-12 DIAGNOSIS — D61818 Other pancytopenia: Secondary | ICD-10-CM | POA: Diagnosis not present

## 2019-05-12 DIAGNOSIS — I10 Essential (primary) hypertension: Secondary | ICD-10-CM | POA: Diagnosis not present

## 2019-05-12 DIAGNOSIS — Z8673 Personal history of transient ischemic attack (TIA), and cerebral infarction without residual deficits: Secondary | ICD-10-CM | POA: Diagnosis not present

## 2019-05-12 DIAGNOSIS — T451X5A Adverse effect of antineoplastic and immunosuppressive drugs, initial encounter: Secondary | ICD-10-CM | POA: Diagnosis not present

## 2019-05-12 DIAGNOSIS — G620 Drug-induced polyneuropathy: Secondary | ICD-10-CM | POA: Diagnosis not present

## 2019-05-12 DIAGNOSIS — K219 Gastro-esophageal reflux disease without esophagitis: Secondary | ICD-10-CM | POA: Diagnosis not present

## 2019-05-12 DIAGNOSIS — R5383 Other fatigue: Secondary | ICD-10-CM | POA: Diagnosis not present

## 2019-05-12 DIAGNOSIS — Z79899 Other long term (current) drug therapy: Secondary | ICD-10-CM | POA: Diagnosis not present

## 2019-05-12 MED ORDER — SENNOSIDES-DOCUSATE SODIUM 8.6-50 MG PO TABS: 1.0000 | ORAL_TABLET | Freq: Every day | ORAL | 1 refills | Status: DC

## 2019-05-12 MED ORDER — IBUPROFEN 600 MG PO TABS: 600.0000 mg | ORAL_TABLET | Freq: Four times a day (QID) | ORAL | 1 refills | Status: DC | PRN

## 2019-05-12 NOTE — H&P (View-Only) (Signed)
Follow-up Note: Gyn-Onc  Consult was requested by Dr. Benjie Karvonen for the evaluation of Paula Peterson 78 y.o. female  CC:  Chief Complaint  Patient presents with  . Malignant neoplasm of ovary, unspecified laterality [facility])   Assessment/Plan:  Paula Peterson  is a 79 y.o.  year old with stage IV ovarian cancer.  She has completed 7 cycles of neoadjuvant chemotherapy (extended due to development of a PE) and has had a partial response.  I spent time discussing with Shaneese the options for treatment including continued chemotherapy versus surgery.  I explained that the role of surgery is known to limited disease as it can only remove macroscopic disease.  She will require additional chemotherapy after surgery.  The patient is interested in pursuing surgery.  I explained the surgery will involve an exploratory laparotomy with BSO, she may only have one ovary, omentectomy, radical tumor debulking.  She will need to be off her anticoagulant therapy for 5 days preoperatively and we will give her prophylactic but not therapeutic dose Lovenox for 1 week postoperatively followed by resumption of her Eliquis.  I explained to the patient that while she is off anticoagulant therapy which is necessary to prevent catastrophic bleeding complications, she is at increased risk for sequelae from a VTE event such as death from PE.  I explained that there are other risks including infection, damage to her bowels, damage to other visceral organs, reoperation, and death which may prevent her from resuming chemotherapy for optimal outcomes.  Surgery is scheduled for early December.  We will check her labs preoperatively to ensure she is recovered adequately from chemotherapy prior to proceeding.  HPI: Paula Peterson is a 78 year old P4 who is seen in consultation at the request of Dr Benjie Karvonen for apparent stage IV ovarian cancer.   Her symptoms began in April/May, 2020.  She has bloating and early satiety. Shortness of breath  with walking. She denied bleeding. She reported constipation with pain with defecation and narrowed stools.   CT abd/pelvis on 11/29/18 showed a moderate size right pleural effusion, a 1.1cm porta hepatis lymph node and a 18.4 x 9.8 cm cystic mass in the pelvis. There was only a scant amount of pelvic ascites present.   CA 125 was drawn on 12/05/18 and 1015  The ptaient's medical history is significant for lumbago, "weak, loose vocal cords", reflux. She had a total hysterectomy via laparotomy for menorrhagia in her 30's. Prior to that she'd had 4 vaginal deliveries.   Interval Hx:  She underwent thoracentesis to establish diagnosis on December 09, 2018. This revealed adenocarcinoma positive for CK7 and PAX 8 - for CK20 consistent with gynecologic primary.  She was started on carboplatin and paclitaxel chemotherapy on December 16, 2018.  Ca1 25 reduced to 948 on January 06, 2019 demonstrating response.  She developed a massive PE on 02/02/19 and was treated with admission to hospital and anticoagulant therapy at approximately cycle 3.   She was changed to Eliquis.  Due to the development of this massive PE she was considered not a good candidate for an interval debulking at that time.  She continued on to receive an additional 4 cycles of carboplatin paclitaxel with cycle 7 administered on April 28, 2019.  Most recent CA 125 on 04/28/19 was 56. This was after 7 cycles.   CT chest /abd/pelvis on May 05, 2019 showed partial response with interval decrease in the size of the large cystic mass in the central pelvic  region, there was no substantial change in the mesenteric and omental soft tissue disease seen.  There was mild right hydronephrosis completely resolved in the interval.  There was no ascites.   Current Meds:  Outpatient Encounter Medications as of 05/12/2019  Medication Sig  . acetaminophen (TYLENOL) 325 MG tablet Take 650 mg by mouth every 6 (six) hours as needed for mild pain or  headache.  Marland Kitchen apixaban (ELIQUIS) 5 MG TABS tablet Take 1 tablet (5 mg total) by mouth 2 (two) times daily.  Marland Kitchen esomeprazole (NEXIUM) 40 MG capsule as needed.   . famotidine (PEPCID) 40 MG tablet Take 40 mg by mouth 2 (two) times daily.  Marland Kitchen losartan (COZAAR) 50 MG tablet Take 50 mg by mouth daily.  . polyethylene glycol (MIRALAX / GLYCOLAX) 17 g packet Take 17 g by mouth daily.   Marland Kitchen ibuprofen (ADVIL) 600 MG tablet Take 1 tablet (600 mg total) by mouth every 6 (six) hours as needed for moderate pain. For AFTER surgery  . levothyroxine (SYNTHROID) 50 MCG tablet Take 1 tablet (50 mcg total) by mouth daily before breakfast.  . lidocaine-prilocaine (EMLA) cream Apply to affected area once (Patient not taking: Reported on 05/12/2019)  . senna-docusate (SENOKOT-S) 8.6-50 MG tablet Take 1-2 tablets by mouth at bedtime. For AFTER surgery, do not take if having diarrhea  . [DISCONTINUED] ondansetron (ZOFRAN) 8 MG tablet Take 1 tablet (8 mg total) by mouth every 8 (eight) hours as needed for refractory nausea / vomiting. (Patient not taking: Reported on 05/12/2019)  . [DISCONTINUED] prochlorperazine (COMPAZINE) 10 MG tablet Take 1 tablet (10 mg total) by mouth every 6 (six) hours as needed (Nausea or vomiting). (Patient not taking: Reported on 05/12/2019)   No facility-administered encounter medications on file as of 05/12/2019.     Allergy: No Known Allergies  Social Hx:   Social History   Socioeconomic History  . Marital status: Married    Spouse name: Fara Olden  . Number of children: 3  . Years of education: Not on file  . Highest education level: Not on file  Occupational History  . Occupation: retired Oncologist  Social Needs  . Financial resource strain: Not on file  . Food insecurity    Worry: Not on file    Inability: Not on file  . Transportation needs    Medical: Not on file    Non-medical: Not on file  Tobacco Use  . Smoking status: Never Smoker  . Smokeless tobacco: Never Used   Substance and Sexual Activity  . Alcohol use: No  . Drug use: No  . Sexual activity: Not Currently  Lifestyle  . Physical activity    Days per week: Not on file    Minutes per session: Not on file  . Stress: Not on file  Relationships  . Social Herbalist on phone: Not on file    Gets together: Not on file    Attends religious service: Not on file    Active member of club or organization: Not on file    Attends meetings of clubs or organizations: Not on file    Relationship status: Not on file  . Intimate partner violence    Fear of current or ex partner: Not on file    Emotionally abused: Not on file    Physically abused: Not on file    Forced sexual activity: Not on file  Other Topics Concern  . Not on file  Social History Narrative  .  Not on file    Past Surgical Hx:  Past Surgical History:  Procedure Laterality Date  . ABDOMINAL HYSTERECTOMY    . BACK SURGERY    . FRACTURE SURGERY    . IR IMAGING GUIDED PORT INSERTION  12/15/2018    Past Medical Hx:  Past Medical History:  Diagnosis Date  . Back pain   . Family history of stomach cancer   . Family history of stomach cancer   . GERD (gastroesophageal reflux disease)   . Hypertension   . Pessary maintenance   . Stroke [facility])     Past Gynecological History:  SVD x 4, hysterectomy for benign menorrhagia in her 30's (total abdominal) No LMP recorded. Patient has had a hysterectomy.  Family Hx:  Family History  Problem Relation Age of Onset  . Stomach cancer Sister        dx 43s-50s  . Dementia Mother   . Cancer Sister        unk type, hysterectomy, dx 53s    Sister had ovarian cancer age 98's. She was treated with surgery and chemotherapy. She survived her cancer but died from CHF and DM complications.   Review of Systems:  Constitutional  Feels fatigued   ENT Normal appearing ears and nares bilaterally Skin/Breast  No rash, sores, jaundice, itching, dryness Cardiovascular  No chest  pain, no shortness of breath on exertion, no edema  Pulmonary  No cough or wheeze.  Gastro Intestinal  No nausea, vomitting, or diarrhoea. No bright red blood per rectum, no abdominal pain, change in bowel movement, no bloating Genito Urinary  No frequency, urgency, dysuria, no pelvic pressure Musculo Skeletal  No myalgia, arthralgia, joint swelling or pain  Neurologic  No weakness, numbness, change in gait,  Psychology  No depression, anxiety, insomnia.   Vitals:  Blood pressure 130/64, pulse 95, temperature 98.3 F (36.8 C), temperature source Temporal, resp. rate 18, height 5\' 7"  (1.702 m), weight 214 lb (97.1 kg), SpO2 100 %.  Physical Exam: WD in NAD Neck  Supple NROM, without any enlargements.  Lymph Node Survey No cervical supraclavicular or inguinal adenopathy Cardiovascular  Pulse normal rate, regularity and rhythm. S1 and S2 normal.  Lungs  Normal BS bilaterally Skin  No rash/lesions/breakdown  Psychiatry  Alert and oriented to person, place, and time  Abdomen  Normoactive bowel sounds, abdomen soft, non-tender and mildly obese (BMI 33) without evidence of hernia.  Back No CVA tenderness Genito Urinary  Vulva/vagina: Normal external female genitalia.  No lesions. No discharge or bleeding.  Bladder/urethra:  No lesions or masses, well supported bladder  Vagina: normal  Cervix and uterus surgically absent  Adnexa: cystic fullness, smooth, filling cul de sac, immobile  Rectal  Good tone, no masses no discrete cul de sac nodularity.  Extremities  No bilateral cyanosis, clubbing or edema.   Thereasa Solo, MD  05/12/2019, 2:25 PM

## 2019-05-12 NOTE — Patient Instructions (Signed)
Preparing for your Surgery  Plan for surgery on June 06, 2019 with Dr. Everitt Amber at [place] will be scheduled for a exploratory laparotomy, bilateral salpingo-oophorectomy, omentectomy, ovarian debulking.   YOU NEED TO STOP YOUR ELIQUIS 5 DAYS BEFORE SURGERY.   AFTER SURGERY, YOU WILL BE ON LOVENOX INJECTIONS FOR ONE WEEK THEN TRANSITIONED BACK ONTO ELIQUIS.  Pre-operative Testing -You will receive a phone call from presurgical testing at [facility] if you have not received a call already to arrange for a pre-operative testing appointment before your surgery.  This appointment normally occurs one to two weeks before your scheduled surgery.   -Bring your insurance card, copy of an advanced directive if applicable, medication list  -At that visit, you will be asked to sign a consent for a possible blood transfusion in case a transfusion becomes necessary during surgery.  The need for a blood transfusion is rare but having consent is a necessary part of your care.     -Do not take supplements such as fish oil (omega 3), red yeast rice, tumeric before your surgery.  Day Before Surgery at [place] will be asked to take in a light diet the day before surgery.  Avoid carbonated beverages.  You will be advised to have nothing to eat or drink after midnight the evening before.    Eat a light diet the day before surgery.  Examples including soups, broths, toast, yogurt, mashed potatoes.  Things to avoid include carbonated beverages (fizzy beverages), raw fruits and raw vegetables, or beans.   If your bowels are filled with gas, your surgeon will have difficulty visualizing your pelvic organs which increases your surgical risks.  Your role in recovery Your role is to become active as soon as directed by your doctor, while still giving yourself time to heal.  Rest when you feel tired. You will be asked to do the following in order to speed your recovery:  - Cough and  breathe deeply. This helps toclear and expand your lungs and can prevent pneumonia.  - Do mild physical activity. Walking or moving your legs help your circulation and body functions return to normal. A staff member will help you when you try to walk and will provide you with simple exercises. Do not try to get up or walk alone the first time. - Actively manage your pain. Managing your pain lets you move in comfort. We will ask you to rate your pain on a scale of zero to 10. It is your responsibility to tell your doctor or nurse where and how much you hurt so your pain can be treated.  Special Considerations -If you are diabetic, you may be placed on insulin after surgery to have closer control over your blood sugars to promote healing and recovery.  This does not mean that you will be discharged on insulin.  If applicable, your oral antidiabetics will be resumed when you are tolerating a solid diet.  -Your final pathology results from surgery should be available around one week after surgery and the results will be relayed to you when available.  -Dr. Lahoma Crocker is the surgeon that assists your GYN Oncologist with surgery.  If you end up staying the night, the next day after your surgery you will either see Dr. Denman George or Dr. Lahoma Crocker.  -FMLA forms can be faxed to [phone] and please allow 5-7 business days for completion.  Pain Management After Surgery -You have been prescribed your pain medication  and bowel regimen medications before surgery so that you can have these available when you are discharged from the hospital. The pain medication is for use ONLY AFTER surgery and a new prescription will not be given.   -Make sure that you have Tylenol and Ibuprofen at home to use on a regular basis after surgery for pain control. We recommend alternating the medications every hour to six hours since they work differently and are processed in the body differently for pain  relief.  -Review the attached handout on narcotic use and their risks and side effects.   Bowel Regimen -You have been prescribed Sennakot-S to take nightly to prevent constipation especially if you are taking the narcotic pain medication intermittently.  It is important to prevent constipation and drink adequate amounts of liquids.  Blood Transfusion Information WHAT IS A BLOOD TRANSFUSION? A transfusion is the replacement of blood or some of its parts. Blood is made up of multiple cells which provide different functions.  Red blood cells carry oxygen and are used for blood loss replacement.  White blood cells fight against infection.  Platelets control bleeding.  Plasma helps clot blood.  Other blood products are available for specialized needs, such as hemophilia or other clotting disorders. BEFORE THE TRANSFUSION  Who gives blood for transfusions?   You may be able to donate blood to be used at a later date on yourself (autologous donation).  Relatives can be asked to donate blood. This is generally not any safer than if you have received blood from a stranger. The same precautions are taken to ensure safety when a relative's blood is donated.  Healthy volunteers who are fully evaluated to make sure their blood is safe. This is blood bank blood. Transfusion therapy is the safest it has ever been in the practice of medicine. Before blood is taken from a donor, a complete history is taken to make sure that person has no history of diseases nor engages in risky social behavior (examples are intravenous drug use or sexual activity with multiple partners). The donor's travel history is screened to minimize risk of transmitting infections, such as malaria. The donated blood is tested for signs of infectious diseases, such as HIV and hepatitis. The blood is then tested to be sure it is compatible with you in order to minimize the chance of a transfusion reaction. If you or a relative donates  blood, this is often done in anticipation of surgery and is not appropriate for emergency situations. It takes many days to process the donated blood. RISKS AND COMPLICATIONS Although transfusion therapy is very safe and saves many lives, the main dangers of transfusion include:   Getting an infectious disease.  Developing a transfusion reaction. This is an allergic reaction to something in the blood you were given. Every precaution is taken to prevent this. The decision to have a blood transfusion has been considered carefully by your caregiver before blood is given. Blood is not given unless the benefits outweigh the risks.  AFTER SURGERY INSTRUCTIONS 05/12/2019  Return to work: 4-6 weeks if applicable  Activity: 1. Be up and out of the bed during the day.  Take a nap if needed.  You may walk up steps but be careful and use the hand rail.  Stair climbing will tire you more than you think, you may need to stop part way and rest.   2. No lifting or straining for 6 weeks.  3. No driving for 2 week(s).  Do not  drive if you are taking narcotic pain medicine.  4. Shower daily.  Use soap and water on your incision and pat dry; don't rub.  No tub baths until cleared by your surgeon.   5. No sexual activity and nothing in the vagina for 2 weeks.  6. You may experience a small amount of clear drainage from your incisions, which is normal.  If the drainage persists or increases, please call the office.  7. Take Tylenol or ibuprofen first for pain and only use narcotic pain medication for severe pain not relieved by the Tylenol or Ibuprofen.  Monitor your Tylenol intake to a max of 4,000 mg.  Diet: 1. Low sodium Heart Healthy Diet is recommended.  2. It is safe to use a laxative, such as Miralax or Colace, if you have difficulty moving your bowels. You can take Sennakot at bedtime every evening to keep bowel movements regular and to prevent constipation.    Wound Care: 1. Keep clean and dry.   Shower daily.  Reasons to call the Doctor:  Fever - Oral temperature greater than 100.4 degrees Fahrenheit  Foul-smelling vaginal discharge  Difficulty urinating  Nausea and vomiting  Increased pain at the site of the incision that is unrelieved with pain medicine.  Difficulty breathing with or without chest pain  New calf pain especially if only on one side  Sudden, continuing increased vaginal bleeding with or without clots.   Contacts: For questions or concerns you should contact:  Dr. Everitt Amber at [phone]  [name], NP at [phone]  After Hours: call [phone] and have the GYN Oncologist paged/contacted  Enoxaparin injection What is this medicine? ENOXAPARIN (ee nox a PA rin) is used after knee, hip, or abdominal surgeries to prevent blood clotting. It is also used to treat existing blood clots in the lungs or in the veins. This medicine may be used for other purposes; ask your health care provider or pharmacist if you have questions. COMMON BRAND NAME(S): Lovenox What should I tell my health care provider before I take this medicine? They need to know if you have any of these conditions:  bleeding disorders, hemorrhage, or hemophilia  infection of the heart or heart valves  kidney or liver disease  previous stroke  prosthetic heart valve  recent surgery or delivery of a baby  ulcer in the stomach or intestine, diverticulitis, or other bowel disease  an unusual or allergic reaction to enoxaparin, heparin, pork or pork products, other medicines, foods, dyes, or preservatives  pregnant or trying to get pregnant  breast-feeding How should I use this medicine? This medicine is for injection under the skin. It is usually given by a health-care professional. You or a family member may be trained on how to give the injections. If you are to give yourself injections, make sure you understand how to use the syringe, measure the dose if necessary,  and give the injection. To avoid bruising, do not rub the site where this medicine has been injected. Do not take your medicine more often than directed. Do not stop taking except on the advice of your doctor or health care professional. Make sure you receive a puncture-resistant container to dispose of the needles and syringes once you have finished with them. Do not reuse these items. Return the container to your doctor or health care professional for proper disposal. Talk to your pediatrician regarding the use of this medicine in children. Special care may be needed. Overdosage: If you think you have  taken too much of this medicine contact a poison control center or emergency room at once. NOTE: This medicine is only for you. Do not share this medicine with others. What if I miss a dose? If you miss a dose, take it as soon as you can. If it is almost time for your next dose, take only that dose. Do not take double or extra doses. What may interact with this medicine?  aspirin and aspirin-like medicines  certain medicines that treat or prevent blood clots  dipyridamole  NSAIDs, medicines for pain and inflammation, like ibuprofen or naproxen This list may not describe all possible interactions. Give your health care provider a list of all the medicines, herbs, non-prescription drugs, or dietary supplements you use. Also tell them if you smoke, drink alcohol, or use illegal drugs. Some items may interact with your medicine. What should I watch for while using this medicine? Visit your healthcare professional for regular checks on your progress. You may need blood work done while you are taking this medicine. Your condition will be monitored carefully while you are receiving this medicine. It is important not to miss any appointments. If you are going to need surgery or other procedure, tell your healthcare professional that you are using this medicine. Using this medicine for a long time may  weaken your bones and increase the risk of bone fractures. Avoid sports and activities that might cause injury while you are using this medicine. Severe falls or injuries can cause unseen bleeding. Be careful when using sharp tools or knives. Consider using an Copy. Take special care brushing or flossing your teeth. Report any injuries, bruising, or red spots on the skin to your healthcare professional. Wear a medical ID bracelet or chain. Carry a card that describes your disease and details of your medicine and dosage times. What side effects may I notice from receiving this medicine? Side effects that you should report to your doctor or health care professional as soon as possible:  allergic reactions like skin rash, itching or hives, swelling of the face, lips, or tongue  bone pain  signs and symptoms of bleeding such as bloody or black, tarry stools; red or dark-brown urine; spitting up blood or brown material that looks like coffee grounds; red spots on the skin; unusual bruising or bleeding from the eye, gums, or nose  signs and symptoms of a blood clot such as chest pain; shortness of breath; pain, swelling, or warmth in the leg  signs and symptoms of a stroke such as changes in vision; confusion; trouble speaking or understanding; severe headaches; sudden numbness or weakness of the face, arm or leg; trouble walking; dizziness; loss of coordination Side effects that usually do not require medical attention (report to your doctor or health care professional if they continue or are bothersome):  hair loss  pain, redness, or irritation at site where injected This list may not describe all possible side effects. Call your doctor for medical advice about side effects. You may report side effects to FDA at 1-800-FDA-1088. Where should I keep my medicine? Keep out of the reach of children. Store at room temperature between 15 and 30 degrees C (59 and 86 degrees F). Do not freeze. If  your injections have been specially prepared, you may need to store them in the refrigerator. Ask your pharmacist. Throw away any unused medicine after the expiration date. NOTE: This sheet is a summary. It may not cover all possible information. If you have  questions about this medicine, talk to your doctor, pharmacist, or health care provider.  2020 Elsevier/Gold Standard (2017-06-10 11:25:34)

## 2019-05-12 NOTE — Progress Notes (Signed)
Follow-up Note: Gyn-Onc  Consult was requested by Dr. Benjie Karvonen for the evaluation of Paula Peterson 78 y.o. female  CC:  Chief Complaint  Patient presents with  . Malignant neoplasm of ovary, unspecified laterality [facility])   Assessment/Plan:  Paula Peterson  is a 78 y.o.  year old with stage IV ovarian cancer.  She has completed 7 cycles of neoadjuvant chemotherapy (extended due to development of a PE) and has had a partial response.  I spent time discussing with [name] the options for treatment including continued chemotherapy versus surgery.  I explained that the role of surgery is known to limited disease as it can only remove macroscopic disease.  She will require additional chemotherapy after surgery.  The patient is interested in pursuing surgery.  I explained the surgery will involve an exploratory laparotomy with BSO, she may only have one ovary, omentectomy, radical tumor debulking.  She will need to be off her anticoagulant therapy for 5 days preoperatively and we will give her prophylactic but not therapeutic dose Lovenox for 1 week postoperatively followed by resumption of her Eliquis.  I explained to the patient that while she is off anticoagulant therapy which is necessary to prevent catastrophic bleeding complications, she is at increased risk for sequelae from a VTE event such as death from PE.  I explained that there are other risks including infection, damage to her bowels, damage to other visceral organs, reoperation, and death which may prevent her from resuming chemotherapy for optimal outcomes.  Surgery is scheduled for early December.  We will check her labs preoperatively to ensure she is recovered adequately from chemotherapy prior to proceeding.  HPI: Paula Peterson is a 78 year old P4 who is seen in consultation at the request of Dr Benjie Karvonen for apparent stage IV ovarian cancer.   Her symptoms began in April/May, 2020.  She has bloating and early satiety. Shortness of breath  with walking. She denied bleeding. She reported constipation with pain with defecation and narrowed stools.   CT abd/pelvis on 11/29/18 showed a moderate size right pleural effusion, a 1.1cm porta hepatis lymph node and a 18.4 x 9.8 cm cystic mass in the pelvis. There was only a scant amount of pelvic ascites present.   CA 125 was drawn on 12/05/18 and 1015  The ptaient's medical history is significant for lumbago, "weak, loose vocal cords", reflux. She had a total hysterectomy via laparotomy for menorrhagia in her 30's. Prior to that she'd had 4 vaginal deliveries.   Interval Hx:  She underwent thoracentesis to establish diagnosis on December 09, 2018. This revealed adenocarcinoma positive for CK7 and PAX 8 - for CK20 consistent with gynecologic primary.  She was started on carboplatin and paclitaxel chemotherapy on December 16, 2018.  Ca1 25 reduced to 948 on January 06, 2019 demonstrating response.  She developed a massive PE on 02/02/19 and was treated with admission to hospital and anticoagulant therapy at approximately cycle 3.   She was changed to Eliquis.  Due to the development of this massive PE she was considered not a good candidate for an interval debulking at that time.  She continued on to receive an additional 4 cycles of carboplatin paclitaxel with cycle 7 administered on April 28, 2019.  Most recent CA 125 on 04/28/19 was 56. This was after 7 cycles.   CT chest /abd/pelvis on May 05, 2019 showed partial response with interval decrease in the size of the large cystic mass in the central pelvic  region, there was no substantial change in the mesenteric and omental soft tissue disease seen.  There was mild right hydronephrosis completely resolved in the interval.  There was no ascites.   Current Meds:  Outpatient Encounter Medications as of 05/12/2019  Medication Sig  . acetaminophen (TYLENOL) 325 MG tablet Take 650 mg by mouth every 6 (six) hours as needed for mild pain or  headache.  Marland Kitchen apixaban (ELIQUIS) 5 MG TABS tablet Take 1 tablet (5 mg total) by mouth 2 (two) times daily.  Marland Kitchen esomeprazole (NEXIUM) 40 MG capsule as needed.   . famotidine (PEPCID) 40 MG tablet Take 40 mg by mouth 2 (two) times daily.  Marland Kitchen losartan (COZAAR) 50 MG tablet Take 50 mg by mouth daily.  . polyethylene glycol (MIRALAX / GLYCOLAX) 17 g packet Take 17 g by mouth daily.   Marland Kitchen ibuprofen (ADVIL) 600 MG tablet Take 1 tablet (600 mg total) by mouth every 6 (six) hours as needed for moderate pain. For AFTER surgery  . levothyroxine (SYNTHROID) 50 MCG tablet Take 1 tablet (50 mcg total) by mouth daily before breakfast.  . lidocaine-prilocaine (EMLA) cream Apply to affected area once (Patient not taking: Reported on 05/12/2019)  . senna-docusate (SENOKOT-S) 8.6-50 MG tablet Take 1-2 tablets by mouth at bedtime. For AFTER surgery, do not take if having diarrhea  . [DISCONTINUED] ondansetron (ZOFRAN) 8 MG tablet Take 1 tablet (8 mg total) by mouth every 8 (eight) hours as needed for refractory nausea / vomiting. (Patient not taking: Reported on 05/12/2019)  . [DISCONTINUED] prochlorperazine (COMPAZINE) 10 MG tablet Take 1 tablet (10 mg total) by mouth every 6 (six) hours as needed (Nausea or vomiting). (Patient not taking: Reported on 05/12/2019)   No facility-administered encounter medications on file as of 05/12/2019.     Allergy: No Known Allergies  Social Hx:   Social History   Socioeconomic History  . Marital status: Married    Spouse name: Fara Olden  . Number of children: 3  . Years of education: Not on file  . Highest education level: Not on file  Occupational History  . Occupation: retired Oncologist  Social Needs  . Financial resource strain: Not on file  . Food insecurity    Worry: Not on file    Inability: Not on file  . Transportation needs    Medical: Not on file    Non-medical: Not on file  Tobacco Use  . Smoking status: Never Smoker  . Smokeless tobacco: Never Used   Substance and Sexual Activity  . Alcohol use: No  . Drug use: No  . Sexual activity: Not Currently  Lifestyle  . Physical activity    Days per week: Not on file    Minutes per session: Not on file  . Stress: Not on file  Relationships  . Social Herbalist on phone: Not on file    Gets together: Not on file    Attends religious service: Not on file    Active member of club or organization: Not on file    Attends meetings of clubs or organizations: Not on file    Relationship status: Not on file  . Intimate partner violence    Fear of current or ex partner: Not on file    Emotionally abused: Not on file    Physically abused: Not on file    Forced sexual activity: Not on file  Other Topics Concern  . Not on file  Social History Narrative  .  Not on file    Past Surgical Hx:  Past Surgical History:  Procedure Laterality Date  . ABDOMINAL HYSTERECTOMY    . BACK SURGERY    . FRACTURE SURGERY    . IR IMAGING GUIDED PORT INSERTION  12/15/2018    Past Medical Hx:  Past Medical History:  Diagnosis Date  . Back pain   . Family history of stomach cancer   . Family history of stomach cancer   . GERD (gastroesophageal reflux disease)   . Hypertension   . Pessary maintenance   . Stroke [facility])     Past Gynecological History:  SVD x 4, hysterectomy for benign menorrhagia in her 30's (total abdominal) No LMP recorded. Patient has had a hysterectomy.  Family Hx:  Family History  Problem Relation Age of Onset  . Stomach cancer Sister        dx 65s-50s  . Dementia Mother   . Cancer Sister        unk type, hysterectomy, dx 37s    Sister had ovarian cancer age 48's. She was treated with surgery and chemotherapy. She survived her cancer but died from CHF and DM complications.   Review of Systems:  Constitutional  Feels fatigued   ENT Normal appearing ears and nares bilaterally Skin/Breast  No rash, sores, jaundice, itching, dryness Cardiovascular  No chest  pain, no shortness of breath on exertion, no edema  Pulmonary  No cough or wheeze.  Gastro Intestinal  No nausea, vomitting, or diarrhoea. No bright red blood per rectum, no abdominal pain, change in bowel movement, no bloating Genito Urinary  No frequency, urgency, dysuria, no pelvic pressure Musculo Skeletal  No myalgia, arthralgia, joint swelling or pain  Neurologic  No weakness, numbness, change in gait,  Psychology  No depression, anxiety, insomnia.   Vitals:  Blood pressure 130/64, pulse 95, temperature 98.3 F (36.8 C), temperature source Temporal, resp. rate 18, height 5\' 7"  (1.702 m), weight 214 lb (97.1 kg), SpO2 100 %.  Physical Exam: WD in NAD Neck  Supple NROM, without any enlargements.  Lymph Node Survey No cervical supraclavicular or inguinal adenopathy Cardiovascular  Pulse normal rate, regularity and rhythm. S1 and S2 normal.  Lungs  Normal BS bilaterally Skin  No rash/lesions/breakdown  Psychiatry  Alert and oriented to person, place, and time  Abdomen  Normoactive bowel sounds, abdomen soft, non-tender and mildly obese (BMI 33) without evidence of hernia.  Back No CVA tenderness Genito Urinary  Vulva/vagina: Normal external female genitalia.  No lesions. No discharge or bleeding.  Bladder/urethra:  No lesions or masses, well supported bladder  Vagina: normal  Cervix and uterus surgically absent  Adnexa: cystic fullness, smooth, filling cul de sac, immobile  Rectal  Good tone, no masses no discrete cul de sac nodularity.  Extremities  No bilateral cyanosis, clubbing or edema.   Thereasa Solo, MD  05/12/2019, 2:25 PM

## 2019-05-16 ENCOUNTER — Other Ambulatory Visit: Payer: Self-pay | Admitting: Hematology and Oncology

## 2019-05-30 ENCOUNTER — Telehealth: Payer: Self-pay | Admitting: Oncology

## 2019-05-30 NOTE — Telephone Encounter (Signed)
Called Rakeisha and reminded her to stop taking Eliquis 5 days preop (last done 05/31/19 evening).  She verbalized understanding and agreement.

## 2019-05-31 NOTE — Patient Instructions (Addendum)
DUE TO COVID-19 ONLY ONE VISITOR IS ALLOWED TO COME WITH YOU AND STAY IN THE WAITING ROOM ONLY DURING PRE OP AND PROCEDURE DAY OF SURGERY. THE 1 VISITOR MAY VISIT WITH YOU AFTER SURGERY IN YOUR PRIVATE ROOM DURING VISITING HOURS ONLY!  YOU NEED TO HAVE A COVID 19 TEST ON_Friday 12/04/2020______ @__1025  am_____, THIS TEST MUST BE DONE BEFORE SURGERY, COME  [place] , [id number].  ([place]) ONCE YOUR COVID TEST IS COMPLETED, PLEASE BEGIN THE QUARANTINE INSTRUCTIONS AS OUTLINED IN YOUR HANDOUT.                Paula Peterson   Your procedure is scheduled on: Tuesday 06/06/2019   Report to [facility] Main  Entrance       Report to admitting at   1145  AM     Call this number if you have problems the morning of surgery [phone]    Follow light diet all day on Monday 06/05/2019 and drink a clear liquid diet  from midnight up until 1045 am the morning of surgery!   Eat a light diet the day before surgery- Monday 12/07 /2020.  Examples including soups, broths, toast, yogurt, mashed potatoes.  Things to avoid include carbonated beverages (fizzy beverages), raw fruits and raw  vegetables, or beans.    If your bowels are filled with gas, your surgeon will have difficulty visualizing your pelvic organs which increases your surgical risks.  CLEAR LIQUID DIET   Foods Allowed                                                                     Foods Excluded  Coffee and tea, regular and decaf                             liquids that you cannot  Plain Jell-O any favor except red or purple                                           see through such as: Fruit ices (not with fruit pulp)                                     milk, soups, orange juice  Iced Popsicles                                    All solid food Carbonated beverages, regular and diet                                    Cranberry, grape and apple juices Sports drinks like Gatorade Lightly  seasoned clear broth or consume(fat free) Sugar, honey syrup  Sample Menu Breakfast  Lunch                                     Supper Cranberry juice                    Beef broth                            Chicken broth Jell-O                                     Grape juice                           Apple juice Coffee or tea                        Jell-O                                      Popsicle                                                Coffee or tea                        Coffee or tea  _____________________________________________________________________      BRUSH YOUR TEETH MORNING OF SURGERY AND RINSE YOUR MOUTH OUT, NO CHEWING GUM CANDY OR MINTS.     Take these medicines the morning of surgery with A SIP OF WATER: Levothyroxine (Synthroid), Esomeprazole (Nexium)                                 You may not have any metal on your body including hair pins and              piercings  Do not wear jewelry, make-up, lotions, powders or perfumes, deodorant             Do not wear nail polish on your fingernails.  Do not shave  48 hours prior to surgery.              Do not bring valuables to the hospital. [place].  Contacts, dentures or bridgework may not be worn into surgery.  Leave suitcase in the car. After surgery it may be brought to your room.                  Please read over the following fact sheets you were given: _____________________________________________________________________             [facility] - Preparing for Surgery Before surgery, you can play an important role.  Because skin is not sterile, your skin needs to be as free of germs as possible.  You can reduce the number of germs on your skin by washing with CHG (chlorahexidine gluconate) soap before surgery.  CHG is an antiseptic cleaner which kills germs and bonds with the skin to continue killing germs even after  washing. Please DO NOT use if you have an allergy to CHG or antibacterial soaps.  If your skin becomes reddened/irritated stop using the CHG and inform your nurse when you arrive at Short Stay. Do not shave (including legs and underarms) for at least 48 hours prior to the first CHG shower.  You may shave your face/neck. Please follow these instructions carefully:  1.  Shower with CHG Soap the night before surgery and the  morning of Surgery.  2.  If you choose to wash your hair, wash your hair first as usual with your  normal  shampoo.  3.  After you shampoo, rinse your hair and body thoroughly to remove the  shampoo.                           4.  Use CHG as you would any other liquid soap.  You can apply chg directly  to the skin and wash                       Gently with a scrungie or clean washcloth.  5.  Apply the CHG Soap to your body ONLY FROM THE NECK DOWN.   Do not use on face/ open                           Wound or open sores. Avoid contact with eyes, ears mouth and genitals (private parts).                       Wash face,  Genitals (private parts) with your normal soap.             6.  Wash thoroughly, paying special attention to the area where your surgery  will be performed.  7.  Thoroughly rinse your body with warm water from the neck down.  8.  DO NOT shower/wash with your normal soap after using and rinsing off  the CHG Soap.                9.  Pat yourself dry with a clean towel.            10.  Wear clean pajamas.            11.  Place clean sheets on your bed the night of your first shower and do not  sleep with pets. Day of Surgery : Do not apply any lotions/deodorants the morning of surgery.  Please wear clean clothes to the hospital/surgery center.  FAILURE TO FOLLOW THESE INSTRUCTIONS MAY RESULT IN THE CANCELLATION OF YOUR SURGERY PATIENT SIGNATURE_________________________________  NURSE  SIGNATURE__________________________________  ________________________________________________________________________   Adam Phenix  An incentive spirometer is a tool that can help keep your lungs clear and active. This tool measures how well you are filling your lungs with each breath. Taking long deep breaths may help reverse or decrease the chance of developing breathing (pulmonary) problems (especially infection) following:  A long period of time when you are unable to move or be active. BEFORE THE PROCEDURE   If the spirometer includes an indicator to show your best effort, your nurse or respiratory therapist will set it to a desired goal.  If possible, sit up straight or lean slightly forward. Try not to slouch.  Hold the incentive spirometer in an upright position. INSTRUCTIONS FOR USE  1. Sit on the edge of your bed if possible, or sit up as far as you can in bed or on a chair. 2. Hold the incentive spirometer in an upright position. 3. Breathe out normally. 4. Place the mouthpiece in your mouth and seal your lips tightly around it. 5. Breathe in slowly and as deeply as possible, raising the piston or the ball toward the top of the column. 6. Hold your breath for 3-5 seconds or for as long as possible. Allow the piston or ball to fall to the bottom of the column. 7. Remove the mouthpiece from your mouth and breathe out normally. 8. Rest for a few seconds and repeat Steps 1 through 7 at least 10 times every 1-2 hours when you are awake. Take your time and take a few normal breaths between deep breaths. 9. The spirometer may include an indicator to show your best effort. Use the indicator as a goal to work toward during each repetition. 10. After each set of 10 deep breaths, practice coughing to be sure your lungs are clear. If you have an incision (the cut made at the time of surgery), support your incision when coughing by placing a pillow or rolled up towels firmly  against it. Once you are able to get out of bed, walk around indoors and cough well. You may stop using the incentive spirometer when instructed by your caregiver.  RISKS AND COMPLICATIONS  Take your time so you do not get dizzy or light-headed.  If you are in pain, you may need to take or ask for pain medication before doing incentive spirometry. It is harder to take a deep breath if you are having pain. AFTER USE  Rest and breathe slowly and easily.  It can be helpful to keep track of a log of your progress. Your caregiver can provide you with a simple table to help with this. If you are using the spirometer at home, follow these instructions: [place] IF:   You are having difficultly using the spirometer.  You have trouble using the spirometer as often as instructed.  Your pain medication is not giving enough relief while using the spirometer.  You develop fever of 100.5 F (38.1 C) or higher. SEEK IMMEDIATE MEDICAL CARE IF:   You cough up bloody sputum that had not been present before.  You develop fever of 102 F (38.9 C) or greater.  You develop worsening pain at or near the incision site. MAKE SURE YOU:   Understand these instructions.  Will watch your condition.  Will get help right away if you are not doing well or get worse. Document Released: 10/26/2006 Document Revised: 09/07/2011 Document Reviewed: 12/27/2006 ExitCare Patient Information 2014 ExitCare, [place].   ________________________________________________________________________  WHAT IS A BLOOD TRANSFUSION? Blood Transfusion Information  A transfusion is the replacement of blood or some of its parts. Blood is made up of multiple cells which provide different functions.  Red blood cells carry oxygen and are used for blood loss replacement.  White blood cells fight against infection.  Platelets control bleeding.  Plasma helps clot blood.  Other blood products are available for  specialized needs, such as hemophilia or other clotting disorders. BEFORE THE TRANSFUSION  Who gives blood for transfusions?   Healthy volunteers who are fully evaluated to make sure their blood is safe. This is blood bank blood. Transfusion therapy is the safest it has ever  been in the practice of medicine. Before blood is taken from a donor, a complete history is taken to make sure that person has no history of diseases nor engages in risky social behavior (examples are intravenous drug use or sexual activity with multiple partners). The donor's travel history is screened to minimize risk of transmitting infections, such as malaria. The donated blood is tested for signs of infectious diseases, such as HIV and hepatitis. The blood is then tested to be sure it is compatible with you in order to minimize the chance of a transfusion reaction. If you or a relative donates blood, this is often done in anticipation of surgery and is not appropriate for emergency situations. It takes many days to process the donated blood. RISKS AND COMPLICATIONS Although transfusion therapy is very safe and saves many lives, the main dangers of transfusion include:   Getting an infectious disease.  Developing a transfusion reaction. This is an allergic reaction to something in the blood you were given. Every precaution is taken to prevent this. The decision to have a blood transfusion has been considered carefully by your caregiver before blood is given. Blood is not given unless the benefits outweigh the risks. AFTER THE TRANSFUSION  Right after receiving a blood transfusion, you will usually feel much better and more energetic. This is especially true if your red blood cells have gotten low (anemic). The transfusion raises the level of the red blood cells which carry oxygen, and this usually causes an energy increase.  The nurse administering the transfusion will monitor you carefully for complications. HOME CARE  INSTRUCTIONS  No special instructions are needed after a transfusion. You may find your energy is better. Speak with your caregiver about any limitations on activity for underlying diseases you may have. SEEK MEDICAL CARE IF:   Your condition is not improving after your transfusion.  You develop redness or irritation at the intravenous (IV) site. SEEK IMMEDIATE MEDICAL CARE IF:  Any of the following symptoms occur over the next 12 hours:  Shaking chills.  You have a temperature by mouth above 102 F (38.9 C), not controlled by medicine.  Chest, back, or muscle pain.  People around you feel you are not acting correctly or are confused.  Shortness of breath or difficulty breathing.  Dizziness and fainting.  You get a rash or develop hives.  You have a decrease in urine output.  Your urine turns a dark color or changes to pink, red, or brown. Any of the following symptoms occur over the next 10 days:  You have a temperature by mouth above 102 F (38.9 C), not controlled by medicine.  Shortness of breath.  Weakness after normal activity.  The white part of the eye turns yellow (jaundice).  You have a decrease in the amount of urine or are urinating less often.  Your urine turns a dark color or changes to pink, red, or brown. Document Released: 06/12/2000 Document Revised: 09/07/2011 Document Reviewed: 01/30/2008 [facility] Patient Information 2014 [place], [place].  _______________________________________________________________________

## 2019-06-02 ENCOUNTER — Encounter (HOSPITAL_COMMUNITY)
Admission: RE | Admit: 2019-06-02 | Discharge: 2019-06-02 | Disposition: A | Discharge status: Home / Self Care | Payer: Medicare Other | Source: Ambulatory Visit | Attending: Gynecologic Oncology | Admitting: Gynecologic Oncology

## 2019-06-02 ENCOUNTER — Other Ambulatory Visit (HOSPITAL_COMMUNITY)
Admission: RE | Admit: 2019-06-02 | Discharge: 2019-06-02 | Disposition: A | Discharge status: Home / Self Care | Payer: Medicare Other | Source: Ambulatory Visit | Attending: Gynecologic Oncology | Admitting: Gynecologic Oncology

## 2019-06-02 ENCOUNTER — Other Ambulatory Visit: Payer: Self-pay

## 2019-06-02 ENCOUNTER — Encounter (HOSPITAL_COMMUNITY): Payer: Self-pay

## 2019-06-02 ENCOUNTER — Encounter (INDEPENDENT_AMBULATORY_CARE_PROVIDER_SITE_OTHER): Payer: Self-pay

## 2019-06-02 DIAGNOSIS — C569 Malignant neoplasm of unspecified ovary: Secondary | ICD-10-CM | POA: Diagnosis not present

## 2019-06-02 DIAGNOSIS — Z20828 Contact with and (suspected) exposure to other viral communicable diseases: Secondary | ICD-10-CM | POA: Diagnosis not present

## 2019-06-02 DIAGNOSIS — Z01812 Encounter for preprocedural laboratory examination: Secondary | ICD-10-CM | POA: Diagnosis not present

## 2019-06-02 LAB — URINALYSIS, ROUTINE W REFLEX MICROSCOPIC
Bilirubin Urine: NEGATIVE
Glucose, UA: NEGATIVE mg/dL
Hgb urine dipstick: NEGATIVE
Ketones, ur: NEGATIVE mg/dL
Nitrite: NEGATIVE
Protein, ur: NEGATIVE mg/dL
Specific Gravity, Urine: 1.012 (ref 1.005–1.030)
pH: 6 (ref 5.0–8.0)

## 2019-06-02 LAB — CBC
HCT: 32.2 % — ABNORMAL LOW (ref 36.0–46.0)
Hemoglobin: 10.4 g/dL — ABNORMAL LOW (ref 12.0–15.0)
MCH: 35.1 pg — ABNORMAL HIGH (ref 26.0–34.0)
MCHC: 32.3 g/dL (ref 30.0–36.0)
MCV: 108.8 fL — ABNORMAL HIGH (ref 80.0–100.0)
Platelets: 216 10*3/uL (ref 150–400)
RBC: 2.96 MIL/uL — ABNORMAL LOW (ref 3.87–5.11)
RDW: 15.7 % — ABNORMAL HIGH (ref 11.5–15.5)
WBC: 3.3 10*3/uL — ABNORMAL LOW (ref 4.0–10.5)
nRBC: 0 % (ref 0.0–0.2)

## 2019-06-02 LAB — COMPREHENSIVE METABOLIC PANEL
ALT: 21 U/L (ref 0–44)
AST: 20 U/L (ref 15–41)
Albumin: 4 g/dL (ref 3.5–5.0)
Alkaline Phosphatase: 67 U/L (ref 38–126)
Anion gap: 9 (ref 5–15)
BUN: 16 mg/dL (ref 8–23)
CO2: 27 mmol/L (ref 22–32)
Calcium: 9.9 mg/dL (ref 8.9–10.3)
Chloride: 102 mmol/L (ref 98–111)
Creatinine, Ser: 0.83 mg/dL (ref 0.44–1.00)
GFR calc Af Amer: >60 mL/min (ref >60)
GFR calc non Af Amer: >60 mL/min (ref >60)
Glucose, Bld: 120 mg/dL — ABNORMAL HIGH (ref 70–99)
Potassium: 4.6 mmol/L (ref 3.5–5.1)
Sodium: 138 mmol/L (ref 135–145)
Total Bilirubin: 0.7 mg/dL (ref 0.3–1.2)
Total Protein: 7.2 g/dL (ref 6.5–8.1)

## 2019-06-02 LAB — ABO/RH: ABO/RH(D): A POS

## 2019-06-02 NOTE — Progress Notes (Addendum)
PCP - Dr. Gar Ponto Cardiologist - N/A  Chest x-ray - 03/14/2019 I view Epic, 05/05/2019- in [place]- CT chest with contrast  EKG - 03/15/2019 Epic Stress Test - N/A ECHO - 02/02/2019 Cardiac Cath - N/A  Sleep Study - N/A CPAP - N/A  Fasting Blood Sugar - N/A Checks Blood Sugar ___0__ times a day  Blood Thinner Instructions:Eliquis- given instructions from Dr. Alvy Bimler- Oncologist  Aspirin Instructions:N/A Last Dose:Tuesday 05/30/2019 of Eliquis  Anesthesia review:  Chart given to Konrad Felix, PA to review.    Patient has a history of DVT, PE, Stroke, HTN, and ovarian cancer.  Patient denies shortness of breath, fever, cough and chest pain at PAT appointment   Patient verbalized understanding of instructions that were given to them at the PAT appointment. Patient was also instructed that they will need to review over the PAT instructions again at home before surgery.

## 2019-06-03 LAB — NOVEL CORONAVIRUS, NAA (HOSP ORDER, SEND-OUT TO REF LAB; TAT 18-24 HRS): SARS-CoV-2, NAA: NOT DETECTED

## 2019-06-05 ENCOUNTER — Telehealth: Payer: Self-pay

## 2019-06-05 NOTE — Telephone Encounter (Signed)
Spoke with husband as Paula Peterson was napping. He stated that Paula Peterson  Was ready for tomorrow and has everything in order.

## 2019-06-06 ENCOUNTER — Inpatient Hospital Stay (HOSPITAL_COMMUNITY)
Admission: RE | Admit: 2019-06-06 | Discharge: 2019-06-09 | DRG: 737 | Disposition: A | Discharge status: Home / Self Care | Payer: Medicare Other | Attending: Gynecologic Oncology | Admitting: Gynecologic Oncology

## 2019-06-06 ENCOUNTER — Other Ambulatory Visit: Payer: Self-pay

## 2019-06-06 ENCOUNTER — Inpatient Hospital Stay (HOSPITAL_COMMUNITY): Payer: Medicare Other | Admitting: Anesthesiology

## 2019-06-06 ENCOUNTER — Encounter (HOSPITAL_COMMUNITY)
Admission: RE | Disposition: A | Discharge status: Home / Self Care | Payer: Self-pay | Source: Home / Self Care | Attending: Gynecologic Oncology

## 2019-06-06 ENCOUNTER — Encounter (HOSPITAL_COMMUNITY): Payer: Self-pay

## 2019-06-06 ENCOUNTER — Inpatient Hospital Stay (HOSPITAL_COMMUNITY): Payer: Medicare Other | Admitting: Physician Assistant

## 2019-06-06 DIAGNOSIS — C561 Malignant neoplasm of right ovary: Principal | ICD-10-CM | POA: Diagnosis present

## 2019-06-06 DIAGNOSIS — K219 Gastro-esophageal reflux disease without esophagitis: Secondary | ICD-10-CM | POA: Diagnosis present

## 2019-06-06 DIAGNOSIS — C781 Secondary malignant neoplasm of mediastinum: Secondary | ICD-10-CM | POA: Diagnosis not present

## 2019-06-06 DIAGNOSIS — C569 Malignant neoplasm of unspecified ovary: Secondary | ICD-10-CM | POA: Diagnosis present

## 2019-06-06 DIAGNOSIS — Z7901 Long term (current) use of anticoagulants: Secondary | ICD-10-CM

## 2019-06-06 DIAGNOSIS — I10 Essential (primary) hypertension: Secondary | ICD-10-CM | POA: Diagnosis present

## 2019-06-06 DIAGNOSIS — Z79890 Hormone replacement therapy (postmenopausal): Secondary | ICD-10-CM

## 2019-06-06 DIAGNOSIS — R109 Unspecified abdominal pain: Secondary | ICD-10-CM | POA: Diagnosis not present

## 2019-06-06 DIAGNOSIS — Z8673 Personal history of transient ischemic attack (TIA), and cerebral infarction without residual deficits: Secondary | ICD-10-CM

## 2019-06-06 DIAGNOSIS — C786 Secondary malignant neoplasm of retroperitoneum and peritoneum: Secondary | ICD-10-CM | POA: Diagnosis not present

## 2019-06-06 DIAGNOSIS — Z79899 Other long term (current) drug therapy: Secondary | ICD-10-CM

## 2019-06-06 DIAGNOSIS — Z8041 Family history of malignant neoplasm of ovary: Secondary | ICD-10-CM | POA: Diagnosis not present

## 2019-06-06 DIAGNOSIS — Z800 Family history of malignant neoplasm of digestive organs: Secondary | ICD-10-CM

## 2019-06-06 DIAGNOSIS — Z833 Family history of diabetes mellitus: Secondary | ICD-10-CM | POA: Diagnosis not present

## 2019-06-06 DIAGNOSIS — Z8249 Family history of ischemic heart disease and other diseases of the circulatory system: Secondary | ICD-10-CM

## 2019-06-06 DIAGNOSIS — G8918 Other acute postprocedural pain: Secondary | ICD-10-CM

## 2019-06-06 DIAGNOSIS — Z86711 Personal history of pulmonary embolism: Secondary | ICD-10-CM

## 2019-06-06 DIAGNOSIS — Z9221 Personal history of antineoplastic chemotherapy: Secondary | ICD-10-CM | POA: Diagnosis not present

## 2019-06-06 DIAGNOSIS — K5909 Other constipation: Secondary | ICD-10-CM

## 2019-06-06 DIAGNOSIS — I2699 Other pulmonary embolism without acute cor pulmonale: Secondary | ICD-10-CM | POA: Diagnosis present

## 2019-06-06 DIAGNOSIS — E039 Hypothyroidism, unspecified: Secondary | ICD-10-CM | POA: Diagnosis not present

## 2019-06-06 HISTORY — PX: OMENTECTOMY: SHX5985

## 2019-06-06 HISTORY — PX: SALPINGOOPHORECTOMY: SHX82

## 2019-06-06 HISTORY — PX: DEBULKING: SHX6277

## 2019-06-06 LAB — TYPE AND SCREEN
ABO/RH(D): A POS
Antibody Screen: NEGATIVE

## 2019-06-06 SURGERY — SALPINGO-OOPHORECTOMY, OPEN
Anesthesia: General | Site: Abdomen | Laterality: Bilateral

## 2019-06-06 MED ORDER — LIDOCAINE 2% (20 MG/ML) 5 ML SYRINGE
INTRAMUSCULAR | Status: DC | PRN
  Administered 2019-06-06: 60 mg via INTRAVENOUS

## 2019-06-06 MED ORDER — ACETAMINOPHEN 500 MG PO TABS
1000.0000 mg | ORAL_TABLET | Freq: Two times a day (BID) | ORAL | Status: DC
  Administered 2019-06-06 – 2019-06-08 (×5): 1000 mg via ORAL
  Filled 2019-06-06 (×5): qty 2

## 2019-06-06 MED ORDER — SUGAMMADEX SODIUM 200 MG/2ML IV SOLN
INTRAVENOUS | Status: DC | PRN
  Administered 2019-06-06: 200 mg via INTRAVENOUS

## 2019-06-06 MED ORDER — OXYCODONE HCL 5 MG PO TABS
5.0000 mg | ORAL_TABLET | ORAL | Status: DC | PRN
  Administered 2019-06-06 – 2019-06-07 (×2): 5 mg via ORAL
  Filled 2019-06-06 (×2): qty 1

## 2019-06-06 MED ORDER — OXYCODONE HCL 5 MG PO TABS: 5.0000 mg | ORAL_TABLET | Freq: Once | ORAL | Status: DC | PRN

## 2019-06-06 MED ORDER — SODIUM CHLORIDE 0.9 % IV SOLN
2.0000 g | INTRAVENOUS | Status: AC
  Administered 2019-06-06: 2 g via INTRAVENOUS
  Filled 2019-06-06: qty 2

## 2019-06-06 MED ORDER — DEXAMETHASONE SODIUM PHOSPHATE 4 MG/ML IJ SOLN: 4.0000 mg | INTRAMUSCULAR | Status: DC

## 2019-06-06 MED ORDER — BUPIVACAINE HCL 0.25 % IJ SOLN
INTRAMUSCULAR | Status: DC | PRN
  Administered 2019-06-06: 20 mL

## 2019-06-06 MED ORDER — FENTANYL CITRATE (PF) 100 MCG/2ML IJ SOLN
INTRAMUSCULAR | Status: AC
  Administered 2019-06-06: 50 ug via INTRAVENOUS
  Filled 2019-06-06: qty 4

## 2019-06-06 MED ORDER — DEXAMETHASONE SODIUM PHOSPHATE 10 MG/ML IJ SOLN
INTRAMUSCULAR | Status: DC | PRN
  Administered 2019-06-06: 10 mg via INTRAVENOUS

## 2019-06-06 MED ORDER — ACETAMINOPHEN 10 MG/ML IV SOLN
1000.0000 mg | Freq: Four times a day (QID) | INTRAVENOUS | Status: DC
  Administered 2019-06-06: 17:00:00 1000 mg via INTRAVENOUS

## 2019-06-06 MED ORDER — PROPOFOL 10 MG/ML IV BOLUS
INTRAVENOUS | Status: DC | PRN
  Administered 2019-06-06: 150 mg via INTRAVENOUS

## 2019-06-06 MED ORDER — ENSURE ENLIVE PO LIQD
237.0000 mL | Freq: Two times a day (BID) | ORAL | Status: DC
  Administered 2019-06-06 – 2019-06-08 (×4): 237 mL via ORAL

## 2019-06-06 MED ORDER — ROCURONIUM BROMIDE 10 MG/ML (PF) SYRINGE
PREFILLED_SYRINGE | INTRAVENOUS | Status: DC | PRN
  Administered 2019-06-06: 40 mg via INTRAVENOUS
  Administered 2019-06-06: 30 mg via INTRAVENOUS

## 2019-06-06 MED ORDER — BUPIVACAINE LIPOSOME 1.3 % IJ SUSP
20.0000 mL | Freq: Once | INTRAMUSCULAR | Status: AC
  Administered 2019-06-06: 20 mL
  Filled 2019-06-06: qty 20

## 2019-06-06 MED ORDER — LEVOTHYROXINE SODIUM 50 MCG PO TABS
50.0000 ug | ORAL_TABLET | Freq: Every morning | ORAL | Status: DC
  Administered 2019-06-07 – 2019-06-09 (×3): 50 ug via ORAL
  Filled 2019-06-06 (×3): qty 1

## 2019-06-06 MED ORDER — CHEWING GUM (ORBIT) SUGAR FREE
1.0000 | CHEWING_GUM | Freq: Three times a day (TID) | ORAL | Status: DC
  Administered 2019-06-07 – 2019-06-09 (×7): 1 via ORAL
  Filled 2019-06-06: qty 1

## 2019-06-06 MED ORDER — FENTANYL CITRATE (PF) 250 MCG/5ML IJ SOLN
INTRAMUSCULAR | Status: AC
  Filled 2019-06-06: qty 5

## 2019-06-06 MED ORDER — PANTOPRAZOLE SODIUM 40 MG PO TBEC
80.0000 mg | DELAYED_RELEASE_TABLET | Freq: Every day | ORAL | Status: DC
  Administered 2019-06-07 – 2019-06-09 (×3): 80 mg via ORAL
  Filled 2019-06-06 (×3): qty 2

## 2019-06-06 MED ORDER — ROCURONIUM BROMIDE 10 MG/ML (PF) SYRINGE
PREFILLED_SYRINGE | INTRAVENOUS | Status: AC
  Filled 2019-06-06: qty 30

## 2019-06-06 MED ORDER — ONDANSETRON HCL 4 MG/2ML IJ SOLN
INTRAMUSCULAR | Status: DC | PRN
  Administered 2019-06-06: 4 mg via INTRAVENOUS

## 2019-06-06 MED ORDER — PHENYLEPHRINE 40 MCG/ML (10ML) SYRINGE FOR IV PUSH (FOR BLOOD PRESSURE SUPPORT)
PREFILLED_SYRINGE | INTRAVENOUS | Status: AC
  Filled 2019-06-06: qty 10

## 2019-06-06 MED ORDER — ACETAMINOPHEN 10 MG/ML IV SOLN: 1000.0000 mg | Freq: Once | INTRAVENOUS | Status: AC

## 2019-06-06 MED ORDER — SUCCINYLCHOLINE CHLORIDE 200 MG/10ML IV SOSY
PREFILLED_SYRINGE | INTRAVENOUS | Status: AC
  Filled 2019-06-06: qty 30

## 2019-06-06 MED ORDER — SODIUM CHLORIDE (PF) 0.9 % IJ SOLN
INTRAMUSCULAR | Status: AC
  Filled 2019-06-06: qty 50

## 2019-06-06 MED ORDER — LIDOCAINE 2% (20 MG/ML) 5 ML SYRINGE
INTRAMUSCULAR | Status: DC | PRN
  Administered 2019-06-06: 4.5 mL/h via INTRAVENOUS

## 2019-06-06 MED ORDER — ONDANSETRON HCL 4 MG PO TABS
4.0000 mg | ORAL_TABLET | Freq: Four times a day (QID) | ORAL | Status: DC | PRN
  Administered 2019-06-08: 4 mg via ORAL
  Filled 2019-06-06: qty 1

## 2019-06-06 MED ORDER — ENOXAPARIN SODIUM 40 MG/0.4ML ~~LOC~~ SOLN
40.0000 mg | SUBCUTANEOUS | Status: AC
  Administered 2019-06-06: 12:00:00 40 mg via SUBCUTANEOUS
  Filled 2019-06-06: qty 0.4

## 2019-06-06 MED ORDER — ENOXAPARIN SODIUM 40 MG/0.4ML ~~LOC~~ SOLN
40.0000 mg | SUBCUTANEOUS | Status: DC
  Administered 2019-06-07 – 2019-06-09 (×3): 40 mg via SUBCUTANEOUS
  Filled 2019-06-06 (×3): qty 0.4

## 2019-06-06 MED ORDER — OXYCODONE HCL 5 MG/5ML PO SOLN: 5.0000 mg | Freq: Once | ORAL | Status: DC | PRN

## 2019-06-06 MED ORDER — EPHEDRINE 5 MG/ML INJ
INTRAVENOUS | Status: AC
  Filled 2019-06-06: qty 10

## 2019-06-06 MED ORDER — FENTANYL CITRATE (PF) 100 MCG/2ML IJ SOLN
INTRAMUSCULAR | Status: DC | PRN
  Administered 2019-06-06: 100 ug via INTRAVENOUS
  Administered 2019-06-06 (×3): 50 ug via INTRAVENOUS

## 2019-06-06 MED ORDER — HYDROMORPHONE HCL 1 MG/ML IJ SOLN
0.5000 mg | INTRAMUSCULAR | Status: DC | PRN
  Administered 2019-06-06 – 2019-06-07 (×4): 0.5 mg via INTRAVENOUS
  Filled 2019-06-06 (×4): qty 0.5

## 2019-06-06 MED ORDER — LACTATED RINGERS IV SOLN
INTRAVENOUS | Status: DC
  Administered 2019-06-06 (×2): via INTRAVENOUS

## 2019-06-06 MED ORDER — IBUPROFEN 400 MG PO TABS: 600.0000 mg | ORAL_TABLET | Freq: Four times a day (QID) | ORAL | Status: DC | PRN

## 2019-06-06 MED ORDER — ACETAMINOPHEN 10 MG/ML IV SOLN
INTRAVENOUS | Status: AC
  Administered 2019-06-06: 17:00:00 1000 mg via INTRAVENOUS
  Filled 2019-06-06: qty 100

## 2019-06-06 MED ORDER — BUPIVACAINE HCL (PF) 0.25 % IJ SOLN
INTRAMUSCULAR | Status: AC
  Filled 2019-06-06: qty 30

## 2019-06-06 MED ORDER — LIDOCAINE 2% (20 MG/ML) 5 ML SYRINGE
INTRAMUSCULAR | Status: AC
  Filled 2019-06-06: qty 15

## 2019-06-06 MED ORDER — LOSARTAN POTASSIUM 50 MG PO TABS
50.0000 mg | ORAL_TABLET | Freq: Every day | ORAL | Status: DC
  Administered 2019-06-07 – 2019-06-09 (×3): 50 mg via ORAL
  Filled 2019-06-06 (×3): qty 1

## 2019-06-06 MED ORDER — KCL IN DEXTROSE-NACL 20-5-0.45 MEQ/L-%-% IV SOLN
INTRAVENOUS | Status: DC
  Administered 2019-06-06 – 2019-06-07 (×4): via INTRAVENOUS
  Filled 2019-06-06 (×5): qty 1000

## 2019-06-06 MED ORDER — GABAPENTIN 100 MG PO CAPS
200.0000 mg | ORAL_CAPSULE | ORAL | Status: AC
  Administered 2019-06-06: 12:00:00 200 mg via ORAL
  Filled 2019-06-06: qty 2

## 2019-06-06 MED ORDER — ACETAMINOPHEN 500 MG PO TABS
1000.0000 mg | ORAL_TABLET | ORAL | Status: AC
  Administered 2019-06-06: 12:00:00 1000 mg via ORAL
  Filled 2019-06-06: qty 2

## 2019-06-06 MED ORDER — PROPOFOL 10 MG/ML IV BOLUS
INTRAVENOUS | Status: AC
  Filled 2019-06-06: qty 20

## 2019-06-06 MED ORDER — SODIUM CHLORIDE (PF) 0.9 % IJ SOLN
INTRAMUSCULAR | Status: DC | PRN
  Administered 2019-06-06: 20 mL

## 2019-06-06 MED ORDER — KETOROLAC TROMETHAMINE 30 MG/ML IJ SOLN
INTRAMUSCULAR | Status: AC
  Administered 2019-06-06: 17:00:00 30 mg via INTRAVENOUS
  Filled 2019-06-06: qty 1

## 2019-06-06 MED ORDER — KETAMINE HCL 10 MG/ML IJ SOLN
INTRAMUSCULAR | Status: DC | PRN
  Administered 2019-06-06: 30 mg via INTRAVENOUS

## 2019-06-06 MED ORDER — FENTANYL CITRATE (PF) 100 MCG/2ML IJ SOLN
25.0000 ug | INTRAMUSCULAR | Status: DC | PRN
  Administered 2019-06-06 (×2): 50 ug via INTRAVENOUS

## 2019-06-06 MED ORDER — ONDANSETRON HCL 4 MG/2ML IJ SOLN: 4.0000 mg | Freq: Four times a day (QID) | INTRAMUSCULAR | Status: DC | PRN

## 2019-06-06 MED ORDER — NON FORMULARY: 1.0000 [IU] | Freq: Three times a day (TID) | Status: DC

## 2019-06-06 MED ORDER — ONDANSETRON HCL 4 MG/2ML IJ SOLN: 4.0000 mg | Freq: Once | INTRAMUSCULAR | Status: DC | PRN

## 2019-06-06 MED ORDER — TRAMADOL HCL 50 MG PO TABS
100.0000 mg | ORAL_TABLET | Freq: Two times a day (BID) | ORAL | Status: DC | PRN
  Administered 2019-06-08: 100 mg via ORAL
  Filled 2019-06-06: qty 2

## 2019-06-06 MED ORDER — KETOROLAC TROMETHAMINE 30 MG/ML IJ SOLN
30.0000 mg | Freq: Once | INTRAMUSCULAR | Status: AC
  Administered 2019-06-06: 17:00:00 30 mg via INTRAVENOUS

## 2019-06-06 MED ORDER — SUCCINYLCHOLINE CHLORIDE 20 MG/ML IJ SOLN
INTRAMUSCULAR | Status: DC | PRN
  Administered 2019-06-06: 120 mg via INTRAVENOUS

## 2019-06-06 SURGICAL SUPPLY — 53 items
ATTRACTOMAT 16X20 MAGNETIC DRP (DRAPES) ×3 IMPLANT
BLADE EXTENDED COATED 6.5IN (ELECTRODE) ×3 IMPLANT
CELLS DAT CNTRL 66122 CELL SVR (MISCELLANEOUS) IMPLANT
CHLORAPREP W/TINT 26 (MISCELLANEOUS) ×3 IMPLANT
CLIP VESOCCLUDE LG 6/CT (CLIP) ×3 IMPLANT
CLIP VESOCCLUDE MED 6/CT (CLIP) ×3 IMPLANT
CLIP VESOCCLUDE SM WIDE 6/CT (CLIP) ×3 IMPLANT
CONT SPEC 4OZ CLIKSEAL STRL BL (MISCELLANEOUS) IMPLANT
COVER WAND RF STERILE (DRAPES) IMPLANT
DERMABOND ADVANCED (GAUZE/BANDAGES/DRESSINGS)
DERMABOND ADVANCED .7 DNX12 (GAUZE/BANDAGES/DRESSINGS) IMPLANT
DRAPE WARM FLUID 44X44 (DRAPES) ×3 IMPLANT
DRSG OPSITE POSTOP 4X10 (GAUZE/BANDAGES/DRESSINGS) IMPLANT
DRSG OPSITE POSTOP 4X12 (GAUZE/BANDAGES/DRESSINGS) ×3 IMPLANT
DRSG OPSITE POSTOP 4X6 (GAUZE/BANDAGES/DRESSINGS) IMPLANT
DRSG OPSITE POSTOP 4X8 (GAUZE/BANDAGES/DRESSINGS) IMPLANT
ELECT REM PT RETURN 15FT ADLT (MISCELLANEOUS) ×3 IMPLANT
GAUZE 4X4 16PLY RFD (DISPOSABLE) ×3 IMPLANT
GLOVE BIO SURGEON STRL SZ 6 (GLOVE) ×6 IMPLANT
GLOVE BIO SURGEON STRL SZ 6.5 (GLOVE) ×6 IMPLANT
GOWN STRL REUS W/ TWL LRG LVL3 (GOWN DISPOSABLE) ×4 IMPLANT
GOWN STRL REUS W/TWL LRG LVL3 (GOWN DISPOSABLE) ×2
HEMOSTAT ARISTA ABSORB 3G PWDR (HEMOSTASIS) IMPLANT
KIT BASIN OR (CUSTOM PROCEDURE TRAY) ×3 IMPLANT
KIT TURNOVER KIT A (KITS) ×3 IMPLANT
LIGASURE IMPACT 36 18CM CVD LR (INSTRUMENTS) ×3 IMPLANT
LOOP VESSEL MAXI BLUE (MISCELLANEOUS) IMPLANT
NEEDLE HYPO 22GX1.5 SAFETY (NEEDLE) ×6 IMPLANT
NS IRRIG 1000ML POUR BTL (IV SOLUTION) ×6 IMPLANT
PACK GENERAL/GYN (CUSTOM PROCEDURE TRAY) ×3 IMPLANT
PENCIL SMOKE EVACUATOR (MISCELLANEOUS) ×3 IMPLANT
RETRACTOR WND ALEXIS 25 LRG (MISCELLANEOUS) ×2 IMPLANT
RTRCTR WOUND ALEXIS 18CM MED (MISCELLANEOUS)
RTRCTR WOUND ALEXIS 25CM LRG (MISCELLANEOUS) ×3
SHEET LAVH (DRAPES) ×3 IMPLANT
SLEEVE SUCTION 125 (MISCELLANEOUS) ×3 IMPLANT
SPONGE LAP 18X18 RF (DISPOSABLE) ×3 IMPLANT
SURGIFLO W/THROMBIN 8M KIT (HEMOSTASIS) IMPLANT
SUT MNCRL AB 4-0 PS2 18 (SUTURE) ×6 IMPLANT
SUT PDS AB 1 TP1 96 (SUTURE) ×6 IMPLANT
SUT VIC AB 2-0 CT1 36 (SUTURE) ×3 IMPLANT
SUT VIC AB 2-0 CT2 27 (SUTURE) ×6 IMPLANT
SUT VIC AB 2-0 SH 27 (SUTURE)
SUT VIC AB 2-0 SH 27X BRD (SUTURE) IMPLANT
SUT VIC AB 3-0 CTX 36 (SUTURE) ×6 IMPLANT
SUT VIC AB 3-0 SH 18 (SUTURE) IMPLANT
SUT VIC AB 3-0 SH 27 (SUTURE) ×1
SUT VIC AB 3-0 SH 27X BRD (SUTURE) ×2 IMPLANT
SYR 30ML LL (SYRINGE) ×6 IMPLANT
TOWEL OR 17X26 10 PK STRL BLUE (TOWEL DISPOSABLE) ×3 IMPLANT
TOWEL OR NON WOVEN STRL DISP B (DISPOSABLE) ×3 IMPLANT
TRAY FOLEY MTR SLVR 16FR STAT (SET/KITS/TRAYS/PACK) ×3 IMPLANT
UNDERPAD 30X36 HEAVY ABSORB (UNDERPADS AND DIAPERS) ×3 IMPLANT

## 2019-06-06 NOTE — Op Note (Signed)
OPERATIVE NOTE DATE:  06/06/19  Preoperative Diagnosis: stage IV ovarian cancer, s/p neoadjuvant chemotherapy, history of recent PE.   Postoperative Diagnosis:same    Procedure(s) Performed: Exploratory laparotomy with right salpingo-oophorectomy, omentectomy radical tumor debulking for ovarian cancer .  Surgeon: Thereasa Solo, MD.  Assistant Surgeon: Lahoma Crocker, M.D. Assistant: (an MD assistant was necessary for tissue manipulation, retraction and positioning due to the complexity of the case and hospital policies).   Specimens: right tube and ovary, omentum, peritoneal nodule.    Estimated Blood Loss: 100 mL.    Urine [id number]  Complications: None.   Operative Findings:  Omental cake adherent to anterior abdominal wall and hepatic flexure. 10cm right tube and ovary. Surgically absent uterus and left tube and ovary. Granular nodularity across right diaphragm.    This represented an optimal cytoreduction (R0) with no gross visible disease remaining.   Procedure:   The patient was seen in the Holding Room. The risks, benefits, complications, treatment options, and expected outcomes were discussed with the patient.  The patient concurred with the proposed plan, giving informed consent.   The patient was  identified as Paula Peterson  and the procedure verified as BSO, omentectomy, tumor debulking. A Time Out was held and the above information confirmed upon entry to the operating room..  After induction of anesthesia, the patient was draped and prepped in the usual sterile manner.  The pessary was removed (it was replaced at the end of the procedure). She was prepped and draped in the normal sterile fashion in the dorsal lithotomy position in padded Allen stirrups with good attention paid to support of the lower back and lower extremities. Position was adjusted for appropriate support. A Foley catheter was placed to gravity.   A right paramedian incision was made and carried through  the subcutaneous tissue to the fascia. The fascial incision was made and extended superiorally. The rectus muscles were separated. The peritoneum was identified and entered. Peritoneal incision was extended longitudinally.  The abdominal cavity was entered sharply and without incident. A Bookwalter retractor was then placed. A survey of the abdomen and pelvis revealed the above findings, which were significant for omental caking, enlarged right ovary, surgically absent uterus and left ovary.  The omental cake was dissected free from the transverse colon from the hepatic flexure to the splenic flexure using sharp metzenbaum scissor dissection. The lesser sac was entered. The tumor cake was separated from the mesentery of the transverse colon. The short gastric vessels were sealed with ligasure and the infragastric omentum was separated from the greater curvature of the stomach removing all bulky tumor. Hemostasis was confirmed. The colon was closely inspected and was noted to be intact and hemostatic.   After packing the small bowel into the upper abdomen, we performed the right salpingo-oophorectomy by entering the  pelvic sidewall just posterior to the right round ligament. The pararectal space was developed and the retroperitoneum developed up to the level of the common iliac artery.  The course of the ureter was identified with ease. The right IP was then skeletonized, and transected after bipolar sealing with the ligasure. The ovary was separated from its peritoneal attachments with the bovie with visualization of the ureter at all times. The ovary was separated from the vaginal cuff/cardinal ligament attatchments using the ligasure.   There was a right mid abdominal peritoneal nodule where the omentum had been adherent to the anterior peritoneum. This was taken off with the bovie by stripping the mid right abdominal  wall peritoneum.  The peritoneal cavity was irrigated and hemostasis was confirmed at all  surgical sites.  Residual tumor was present at the diaphragm (granular nodularity but no visible tumor.   The fascia was reapproximated with 0 looped PDS using a total of two sutures. The subcutaneous layer was then irrigated copiously.  Exparel long acting local anesthetic was infiltrated into the subcutaneous tissues. The skin was closed with subcuticular suture. The patient tolerated the procedure well.   Sponge, lap and needle counts were correct x 2.

## 2019-06-06 NOTE — Transfer of Care (Signed)
Immediate Anesthesia Transfer of Care Note  Patient: Yaeli Herman Califano  Procedure(s) Performed: EXPLORATORY LAPAROTOMY,OPEN RIGHT SALPINGO OOPHORECTOMY (Bilateral Abdomen) OMENTECTOMY (Bilateral ) RADICAL TUMOR DEBULKING (Bilateral )  Patient Location: PACU  Anesthesia Type:General  Level of Consciousness: sedated, patient cooperative and responds to stimulation  Airway & Oxygen Therapy: Patient Spontanous Breathing and Patient connected to face mask oxygen  Post-op Assessment: Report given to RN and Post -op Vital signs reviewed and stable  Post vital signs: Reviewed and stable  Last Vitals:  Vitals Value Taken Time  BP 127/93 06/06/19 1608  Temp    Pulse 77 06/06/19 1609  Resp 16 06/06/19 1611  SpO2 100 % 06/06/19 1609  Vitals shown include unvalidated device data.  Last Pain:  Vitals:   06/06/19 1158  TempSrc:   PainSc: 5          Complications: No apparent anesthesia complications

## 2019-06-06 NOTE — Anesthesia Preprocedure Evaluation (Signed)

## 2019-06-06 NOTE — Interval H&P Note (Signed)
History and Physical Interval Note:  06/06/2019 1:19 PM  Paula Peterson  has presented today for surgery, with the diagnosis of OVARIAN CANCER.  The various methods of treatment have been discussed with the patient and family. After consideration of risks, benefits and other options for treatment, the patient has consented to  Procedure(s): OPEN BILATERAL SALPINGO OOPHORECTOMY (Bilateral) OMENTECTOMY (Bilateral) OVARIAN DEBULKING (Bilateral) as a surgical intervention.  The patient's history has been reviewed, patient examined, no change in status, stable for surgery.  I have reviewed the patient's chart and labs.  Questions were answered to the patient's satisfaction.     Thereasa Solo

## 2019-06-06 NOTE — Anesthesia Postprocedure Evaluation (Signed)
Anesthesia Post Note  Patient: Paula Peterson  Procedure(s) Performed: EXPLORATORY LAPAROTOMY,OPEN RIGHT SALPINGO OOPHORECTOMY (Bilateral Abdomen) OMENTECTOMY (Bilateral ) RADICAL TUMOR DEBULKING (Bilateral )     Patient location during evaluation: PACU Anesthesia Type: General Level of consciousness: awake and alert Pain management: pain level controlled Vital Signs Assessment: post-procedure vital signs reviewed and stable Respiratory status: spontaneous breathing, nonlabored ventilation, respiratory function stable and patient connected to nasal cannula oxygen Cardiovascular status: blood pressure returned to baseline and stable Postop Assessment: no apparent nausea or vomiting Anesthetic complications: no    Last Vitals:  Vitals:   06/06/19 1730 06/06/19 1800  BP: (!) 106/58 (!) 141/66  Pulse: (!) 58 66  Resp: 12 20  Temp:  (!) 36.4 C  SpO2: 100% 100%    Last Pain:  Vitals:   06/06/19 1800  TempSrc: Oral  PainSc:                  Azucena Dart COKER

## 2019-06-06 NOTE — Anesthesia Procedure Notes (Signed)
Procedure Name: Intubation Performed by: Keishawn Darsey J, CRNA Pre-anesthesia Checklist: Patient identified, Emergency Drugs available, Suction available, Timeout performed and Patient being monitored Patient Re-evaluated:Patient Re-evaluated prior to induction Oxygen Delivery Method: Circle system utilized Preoxygenation: Pre-oxygenation with 100% oxygen Induction Type: IV induction Ventilation: Mask ventilation without difficulty Laryngoscope Size: Mac and 3 Grade View: Grade I Tube type: Oral Tube size: 7.0 mm Number of attempts: 1 Airway Equipment and Method: Stylet Placement Confirmation: ETT inserted through vocal cords under direct vision,  positive ETCO2 and breath sounds checked- equal and bilateral Secured at: 21 cm Tube secured with: Tape Dental Injury: Teeth and Oropharynx as per pre-operative assessment        

## 2019-06-07 ENCOUNTER — Encounter (HOSPITAL_COMMUNITY): Payer: Self-pay | Admitting: Gynecologic Oncology

## 2019-06-07 LAB — CBC
HCT: 30.7 % — ABNORMAL LOW (ref 36.0–46.0)
Hemoglobin: 9.9 g/dL — ABNORMAL LOW (ref 12.0–15.0)
MCH: 34.9 pg — ABNORMAL HIGH (ref 26.0–34.0)
MCHC: 32.2 g/dL (ref 30.0–36.0)
MCV: 108.1 fL — ABNORMAL HIGH (ref 80.0–100.0)
Platelets: 225 10*3/uL (ref 150–400)
RBC: 2.84 MIL/uL — ABNORMAL LOW (ref 3.87–5.11)
RDW: 15.1 % (ref 11.5–15.5)
WBC: 5.9 10*3/uL (ref 4.0–10.5)
nRBC: 0 % (ref 0.0–0.2)

## 2019-06-07 LAB — BASIC METABOLIC PANEL
Anion gap: 10 (ref 5–15)
BUN: 17 mg/dL (ref 8–23)
CO2: 22 mmol/L (ref 22–32)
Calcium: 9.7 mg/dL (ref 8.9–10.3)
Chloride: 101 mmol/L (ref 98–111)
Creatinine, Ser: 0.96 mg/dL (ref 0.44–1.00)
GFR calc Af Amer: >60 mL/min (ref >60)
GFR calc non Af Amer: 57 mL/min — ABNORMAL LOW (ref >60)
Glucose, Bld: 180 mg/dL — ABNORMAL HIGH (ref 70–99)
Potassium: 4.7 mmol/L (ref 3.5–5.1)
Sodium: 133 mmol/L — ABNORMAL LOW (ref 135–145)

## 2019-06-07 MED ORDER — IBUPROFEN 400 MG PO TABS
600.0000 mg | ORAL_TABLET | Freq: Four times a day (QID) | ORAL | Status: DC | PRN
  Administered 2019-06-09: 600 mg via ORAL
  Filled 2019-06-07: qty 1

## 2019-06-07 MED ORDER — ENOXAPARIN (LOVENOX) PATIENT EDUCATION KIT
PACK | Freq: Once | Status: AC
  Administered 2019-06-07: 16:00:00
  Filled 2019-06-07: qty 1

## 2019-06-07 MED ORDER — KETOROLAC TROMETHAMINE 15 MG/ML IJ SOLN
15.0000 mg | Freq: Three times a day (TID) | INTRAMUSCULAR | Status: AC
  Administered 2019-06-07 – 2019-06-08 (×3): 15 mg via INTRAVENOUS
  Filled 2019-06-07 (×3): qty 1

## 2019-06-07 MED ORDER — OXYCODONE HCL 5 MG PO TABS
5.0000 mg | ORAL_TABLET | ORAL | Status: DC | PRN
  Administered 2019-06-07 – 2019-06-09 (×6): 10 mg via ORAL
  Filled 2019-06-07 (×6): qty 2

## 2019-06-07 NOTE — Progress Notes (Signed)
1 Day Post-Op Procedure(s) (LRB): EXPLORATORY LAPAROTOMY,OPEN RIGHT SALPINGO OOPHORECTOMY (Bilateral) OMENTECTOMY (Bilateral) RADICAL TUMOR DEBULKING (Bilateral)  Subjective: Patient reports moderate abdominal pain.  She also reports right hip pain that has been chronic since a tear occurred in the hip prior to starting treatment.  She states she was told to "take care of the cancer first."  Tolerating diet with no nausea or emesis.  Ambulating with assist but reports moderate pain with movement.  States the pain medication administered "helps some."  Voiding since foley removal.  Denies passing flatus. Husband at the bedside. No other concerns voiced.  Objective: Vital signs in last 24 hours: Temp:  [97.4 F (36.3 C)-98.6 F (37 C)] 97.4 F (36.3 C) (12/09 0915) Pulse Rate:  [58-92] 92 (12/09 0915) Resp:  [12-20] 18 (12/09 0915) BP: (97-162)/(51-93) 151/68 (12/09 0915) SpO2:  [95 %-100 %] 100 % (12/09 0915) Weight:  [212 lb 9.6 oz (96.4 kg)] 212 lb 9.6 oz (96.4 kg) (12/08 2003) Last BM Date: 06/05/19  Intake/Output from previous day: 12/08 0701 - 12/09 0700 In: 3421.7 [P.O.:490; I.V.:2931.7] Out: 1140 [Urine:1040; Blood:100]  Physical Examination: General: alert, cooperative and appear uncomfortable due to pain Resp: clear to auscultation bilaterally Cardio: regular rate and rhythm, S1, S2 normal, no murmur, click, rub or gallop GI: incision: midline op site dressing in place with no active drainage noted underneath and abdomen obese, soft, hypoactive bowel sounds, tender, no rebound or guarding Extremities: extremities normal, atraumatic, no cyanosis or edema  Labs: WBC/Hgb/Hct/Plts:  5.9/9.9/30.7/225 (12/09 0456) BUN/Cr/glu/ALT/AST/amyl/lip:  17/0.96/--/--/--/--/-- (12/09 0456)  Assessment: 78 y.o. s/p Procedure(s): EXPLORATORY LAPAROTOMY,OPEN RIGHT SALPINGO OOPHORECTOMY OMENTECTOMY RADICAL TUMOR DEBULKING: stable Pain:  Pain is not well-controlled on PRN medications.  Oxycodone PRN adjusted to 5-10 mg PRN.  IV toradol ordered for 24 hr period. Kpad/abd binder for symptom relief.  Heme: Hgb 9.9 and Hct 30.7 this am.  Stable compared with pre-op values and surgical losses.  CV: BP and HR stable.  Continue to monitor with routine vital sign measurements.  GI:  Tolerating po: Yes. Antiemetics ordered PRN.  GU: Reporting voiding since foley removal. Creatinine 0.96 this am.  FEN: No critical values this am.  Prophylaxis: Lovenox and SCDs. Lovenox teaching kit ordered for home use.  To administer prophylactic lovenox for one week prior to resuming Eliquis.  Plan: Oxycodone adjusted to 5-10 mg PRN IV Toradol ordered for 24 hours. Ibup on hold then to resume after 24 hours Kpad Abdominal binder PT evaluation due to decreased mobility from post-op pain and previous right hip tear Diet as tolerated IVF to 50 cc/hr Encourage ambulation, IS use, deep breathing, and coughing Continue plan of care per Dr. Berline Lopes   LOS: 1 day     D  06/07/2019, 11:13 AM

## 2019-06-07 NOTE — Progress Notes (Signed)
Writer encourage patient to ambulate. Patient refused to ambulate

## 2019-06-08 LAB — SURGICAL PATHOLOGY

## 2019-06-08 MED ORDER — POLYETHYLENE GLYCOL 3350 17 G PO PACK
17.0000 g | PACK | Freq: Every day | ORAL | Status: DC
  Administered 2019-06-08 – 2019-06-09 (×2): 17 g via ORAL
  Filled 2019-06-08 (×2): qty 1

## 2019-06-08 MED ORDER — BISACODYL 10 MG RE SUPP
10.0000 mg | Freq: Once | RECTAL | Status: AC
  Administered 2019-06-08: 10 mg via RECTAL
  Filled 2019-06-08: qty 1

## 2019-06-08 MED ORDER — APIXABAN 5 MG PO TABS: 5.0000 mg | ORAL_TABLET | Freq: Two times a day (BID) | ORAL | 0 refills | Status: DC

## 2019-06-08 MED ORDER — ENOXAPARIN SODIUM 40 MG/0.4ML ~~LOC~~ SOLN: 40.0000 mg | SUBCUTANEOUS | 0 refills | Status: DC

## 2019-06-08 MED ORDER — OXYCODONE HCL 5 MG PO TABS: 5.0000 mg | ORAL_TABLET | ORAL | 0 refills | Status: DC | PRN

## 2019-06-08 NOTE — Care Management Important Message (Signed)
Important Message  Patient Details IM Letter given to Roque Lias SW Case Manager to present to the Patient Name: Paula Peterson MRN: [id number] Date of Birth: 12-May-1941   Medicare Important Message Given:  Yes     Kerin Salen 06/08/2019, 12:47 PM

## 2019-06-08 NOTE — Progress Notes (Signed)
Patient resting quietly in bed with husband at the bedside.  States she has still not had a BM but she has passed moderate flatus.  No other concerns voiced.

## 2019-06-08 NOTE — Evaluation (Signed)
Physical Therapy Evaluation Patient Details Name: Paula Peterson MRN: [id number] DOB: 14-Feb-1941 Today's Date: 06/08/2019   History of Present Illness  78 y/o female with hx of stage 4 ovarian cancer s/p OPEN BILATERAL SALPINGO OOPHORECTOMY (Bilateral)OMENTECTOMY (Bilateral)OVARIAN DEBULKING (06/06/19). PMH includes PE, DVT, CVA, back pain, R hip labrum tear   Clinical Impression  Pt admitted with above diagnosis. Pt currently with functional limitations due to the deficits listed below (see PT Problem List). Pt will benefit from skilled PT to increase their independence and safety with mobility to allow discharge to the venue listed below.  Pt o2 97-99% on RA throughout treatment despite dyspnea at times.  Instructed in use of pillow for bracing self with mobility.  She was able to ambulate 44' with RW and min/guard.  She is not interested in HHPT and states her husband and friends/family will be able to assist her.      Follow Up Recommendations No PT follow up(pt declines HHPT)    Equipment Recommendations  None recommended by PT    Recommendations for Other Services       Precautions / Restrictions Precautions Precautions: Fall Restrictions Weight Bearing Restrictions: No      Mobility  Bed Mobility Overal bed mobility: Needs Assistance Bed Mobility: Rolling;Sidelying to Sit Rolling: Min guard Sidelying to sit: Min guard;HOB elevated       General bed mobility comments: Pt instructed in log rolling and cues for technique and bracing for pain management.  Pt did use rail to A.  Transfers Overall transfer level: Needs assistance Equipment used: Rolling walker (2 wheeled) Transfers: Sit to/from Stand Sit to Stand: Min guard         General transfer comment: min/guard for steadying. PT assisted pt in donning abdominal binder prior to gait.  O2 97% on room air  Ambulation/Gait Ambulation/Gait assistance: Min guard Gait Distance (Feet): 80 Feet Assistive device:  Rolling walker (2 wheeled) Gait Pattern/deviations: Step-through pattern;Decreased step length - right;Decreased step length - left Gait velocity: decreased   General Gait Details: towards end of gait, steps became shorter. Some dyspnea noted, with o2 99% on room air.  Stairs            Wheelchair Mobility    Modified Rankin (Stroke Patients Only)       Balance Overall balance assessment: Mild deficits observed, not formally tested                                           Pertinent Vitals/Pain Pain Assessment: 0-10 Pain Score: 6  Pain Location: abd Pain Descriptors / Indicators: Other (Comment)(bloated) Pain Intervention(s): Limited activity within patient's tolerance;Monitored during session;Patient requesting pain meds-RN notified    Home Living Family/patient expects to be discharged to:: Private residence Living Arrangements: Spouse/significant other Available Help at Discharge: Family;Available 24 hours/day Type of Home: House Home Access: Stairs to enter Entrance Stairs-Rails: None Entrance Stairs-Number of Steps: 1 Home Layout: One level Home Equipment: Walker - 2 wheels      Prior Function Level of Independence: Independent               Hand Dominance        Extremity/Trunk Assessment   Upper Extremity Assessment Upper Extremity Assessment: Overall WFL for tasks assessed    Lower Extremity Assessment Lower Extremity Assessment: Overall WFL for tasks assessed(hx of R hip labral tear)  Communication   Communication: No difficulties  Cognition Arousal/Alertness: Awake/alert Behavior During Therapy: WFL for tasks assessed/performed Overall Cognitive Status: Within Functional Limits for tasks assessed                                        General Comments      Exercises     Assessment/Plan    PT Assessment Patient needs continued PT services  PT Problem List Decreased strength;Decreased  mobility;Decreased activity tolerance;Decreased balance       PT Treatment Interventions DME instruction;Gait training;Functional mobility training;Therapeutic activities;Therapeutic exercise;Balance training    PT Goals (Current goals can be found in the Care Plan section)  Acute Rehab PT Goals Patient Stated Goal: go home with husband's support PT Goal Formulation: With patient Time For Goal Achievement: 06/22/19 Potential to Achieve Goals: Good    Frequency Min 3X/week   Barriers to discharge        Co-evaluation               AM-PAC PT "6 Clicks" Mobility  Outcome Measure Help needed turning from your back to your side while in a flat bed without using bedrails?: A Lot Help needed moving from lying on your back to sitting on the side of a flat bed without using bedrails?: A Lot Help needed moving to and from a bed to a chair (including a wheelchair)?: A Little Help needed standing up from a chair using your arms (e.g., wheelchair or bedside chair)?: A Little Help needed to walk in hospital room?: A Little Help needed climbing 3-5 steps with a railing? : A Lot 6 Click Score: 15    End of Session Equipment Utilized During Treatment: Gait belt;Other (comment)(abd binder) Activity Tolerance: Patient limited by pain;Patient limited by fatigue Patient left: in chair;with call bell/phone within reach Nurse Communication: Mobility status;Patient requests pain meds PT Visit Diagnosis: Muscle weakness (generalized) (M62.81);Difficulty in walking, not elsewhere classified (R26.2)    Time: [id number] PT Time Calculation (min) (ACUTE ONLY): 25 min   Charges:   PT Evaluation $PT Eval Low Complexity: 1 Low PT Treatments $Gait Training: 8-22 mins        Stacy Deshler L. Tamala Julian, [place] Pager [phone] 06/08/2019   [name] 06/08/2019, 9:53 AM

## 2019-06-08 NOTE — Progress Notes (Signed)
2 Days Post-Op Procedure(s) (LRB): EXPLORATORY LAPAROTOMY,OPEN RIGHT SALPINGO OOPHORECTOMY (Bilateral) OMENTECTOMY (Bilateral) RADICAL TUMOR DEBULKING (Bilateral)  Subjective: Patient reports improvement in incisional pain compared to yesterday.  Has decreased appetite but no nausea or emesis.  Ambulating with assist.  Voiding without issues.  Passed flatus last pm.  States she thinks she would feel better if she was able to have a bowel movement since she goes everyday. Denies chest pain, dyspnea.  Asking if she will go home today.  No other concerns voiced.  Objective: Vital signs in last 24 hours: Temp:  [97.8 F (36.6 C)-98.3 F (36.8 C)] 98.3 F (36.8 C) (12/10 0615) Pulse Rate:  [77-86] 77 (12/10 0615) Resp:  [16-18] 18 (12/10 0615) BP: (116-159)/(51-74) 138/55 (12/10 0615) SpO2:  [96 %-99 %] 99 % (12/10 0615) Last BM Date: 06/05/19  Intake/Output from previous day: 12/09 0701 - 12/10 0700 In: 3910.3 [P.O.:1900; I.V.:2010.3] Out: 2750 [Urine:2750]  Physical Examination: General: alert, cooperative and no distress Resp: clear to auscultation bilaterally Cardio: regular rate and rhythm, S1, S2 normal, no murmur, click, rub or gallop GI: incision: midline op site dressing in place with no active drainage noted underneath and abdomen obese, soft, active bowel sounds, less tender than 06/07/2019, no rebound or guarding Extremities: extremities normal, atraumatic, no cyanosis or edema  Assessment: 78 y.o. s/p Procedure(s): EXPLORATORY LAPAROTOMY,OPEN RIGHT SALPINGO OOPHORECTOMY OMENTECTOMY RADICAL TUMOR DEBULKING: stable Pain:  Pain is well-controlled on PRN medications. Kpad at the bedside.  Heme: Hgb 9.9 and Hct 30.7 06/07/2019 am.  Stable compared with pre-op values and surgical losses.  CV: BP and HR stable.  Continue to monitor with routine vital sign measurements.  GI:  Tolerating po: Yes. Antiemetics ordered PRN.  GU: Reporting voiding since foley removal. Creatinine  0.96 06/07/2019 am.  FEN: No critical values this am.  Prophylaxis: Lovenox and SCDs. Lovenox teaching kit ordered for home use.  To administer prophylactic lovenox for one week prior to resuming Eliquis.  Plan: Saline lock IV Miralax daily and dulcolax suppository this am PT evaluation due to decreased mobility from post-op pain and previous right hip tear Diet as tolerated Encourage ambulation, IS use, deep breathing, and coughing Continue plan of care per Dr. Denman George Plan for possible discharge tomorrow   LOS: 2 days    Melissa D Cross 06/08/2019, 10:07 AM

## 2019-06-09 ENCOUNTER — Telehealth: Payer: Self-pay

## 2019-06-09 ENCOUNTER — Inpatient Hospital Stay (HOSPITAL_COMMUNITY): Payer: Medicare Other

## 2019-06-09 LAB — BASIC METABOLIC PANEL
Anion gap: 9 (ref 5–15)
BUN: 15 mg/dL (ref 8–23)
CO2: 26 mmol/L (ref 22–32)
Calcium: 9.7 mg/dL (ref 8.9–10.3)
Chloride: 100 mmol/L (ref 98–111)
Creatinine, Ser: 0.8 mg/dL (ref 0.44–1.00)
GFR calc Af Amer: >60 mL/min (ref >60)
GFR calc non Af Amer: >60 mL/min (ref >60)
Glucose, Bld: 113 mg/dL — ABNORMAL HIGH (ref 70–99)
Potassium: 4.2 mmol/L (ref 3.5–5.1)
Sodium: 135 mmol/L (ref 135–145)

## 2019-06-09 LAB — CBC
HCT: 29.4 % — ABNORMAL LOW (ref 36.0–46.0)
Hemoglobin: 9.4 g/dL — ABNORMAL LOW (ref 12.0–15.0)
MCH: 34.3 pg — ABNORMAL HIGH (ref 26.0–34.0)
MCHC: 32 g/dL (ref 30.0–36.0)
MCV: 107.3 fL — ABNORMAL HIGH (ref 80.0–100.0)
Platelets: 227 10*3/uL (ref 150–400)
RBC: 2.74 MIL/uL — ABNORMAL LOW (ref 3.87–5.11)
RDW: 15 % (ref 11.5–15.5)
WBC: 5.1 10*3/uL (ref 4.0–10.5)
nRBC: 0 % (ref 0.0–0.2)

## 2019-06-09 NOTE — Progress Notes (Signed)
3 Days Post-Op Procedure(s) (LRB): EXPLORATORY LAPAROTOMY,OPEN RIGHT SALPINGO OOPHORECTOMY (Bilateral) OMENTECTOMY (Bilateral) RADICAL TUMOR DEBULKING (Bilateral)  Subjective: Patient reports persistent abdominal pain. Her levels of pain that are vocalized are greater than expected for her postop course (normal vital signs).  She is bothered by a lack of bowel movement despite miralax and suppository. She is passing flatus and tolerating PO. She felt nauseated with the pain this morning but has had no emesis.  She is voiding adequately and ambulating.   Objective: Vital signs in last 24 hours: Temp:  [98.1 F (36.7 C)-98.8 F (37.1 C)] 98.1 F (36.7 C) (12/11 0541) Pulse Rate:  [71-83] 71 (12/11 0541) Resp:  [16] 16 (12/11 0541) BP: (127-152)/(65-80) 127/65 (12/11 0541) SpO2:  [93 %-97 %] 97 % (12/11 0541) Last BM Date: 06/05/19  Intake/Output from previous day: 12/10 0701 - 12/11 0700 In: 590 [P.O.:590] Out: 2150 [Urine:2150]  Physical Examination: General: alert, cooperative and no distress Resp: clear to auscultation bilaterally Cardio: regular rate and rhythm, S1, S2 normal, no murmur, click, rub or gallop GI: incision: midline op site dressing in place with no active drainage noted underneath and abdomen obese, soft, active bowel sounds, no rebound or guarding Extremities: extremities normal, atraumatic, no cyanosis or edema  Assessment: 78 y.o. s/p Procedure(s): EXPLORATORY LAPAROTOMY,OPEN RIGHT SALPINGO OOPHORECTOMY OMENTECTOMY RADICAL TUMOR DEBULKING: stable Pain:  Pain is well-controlled on PRN medications. Kpad at the bedside.  Heme: Hgb 9.9 and Hct 30.7 06/07/2019 am.  Stable compared with pre-op values and surgical losses.  CV: BP and HR stable.  Continue to monitor with routine vital sign measurements.  GI:  Tolerating po: Yes. Antiemetics ordered PRN.  GU: Reporting voiding since foley removal. Creatinine 0.96 06/07/2019 am.  FEN: No critical values this  am.  Prophylaxis: Lovenox and SCDs. Lovenox teaching kit ordered for home use.  To administer prophylactic lovenox for one week prior to resuming Eliquis.  Plan: On examination with vital signs and PE I see no concerning findings to explain her pain. I will check labs and an abdominal series. If these are also unremarkable, I think it would be reasonable for her to be discharged to home as she is otherwise meeting all discharge criteria.    LOS: 3 days    Thereasa Solo 06/09/2019, 10:14 AM

## 2019-06-09 NOTE — Plan of Care (Signed)
  Problem: Education: Goal: Knowledge of the prescribed therapeutic regimen will improve Outcome: Adequate for Discharge Goal: Understanding of sexual limitations or changes related to disease process or condition will improve Outcome: Adequate for Discharge Goal: Individualized Educational Video(s) Outcome: Adequate for Discharge   Problem: Self-Concept: Goal: Communication of feelings regarding changes in body function or appearance will improve Outcome: Adequate for Discharge   Problem: Skin Integrity: Goal: Demonstration of wound healing without infection will improve Outcome: Adequate for Discharge  Discharge teaching done with patient and husband. Written information given.

## 2019-06-09 NOTE — Progress Notes (Signed)
Physical Therapy Treatment Patient Details Name: Paula Peterson MRN: [id number] DOB: 1941/04/05 Today's Date: 06/09/2019    History of Present Illness 78 y/o female with hx of stage 4 ovarian cancer s/p OPEN BILATERAL SALPINGO OOPHORECTOMY (Bilateral)OMENTECTOMY (Bilateral)OVARIAN DEBULKING (06/06/19). PMH includes PE, DVT, CVA, back pain, R hip labrum tear    PT Comments    Pt progressing well, continues to fatigue with amb. Would benefit from HHPT however pt declines per previous notes   Follow Up Recommendations  No PT follow up     Equipment Recommendations  None recommended by PT    Recommendations for Other Services       Precautions / Restrictions Precautions Precautions: Fall Restrictions Weight Bearing Restrictions: No    Mobility  Bed Mobility Overal bed mobility: Needs Assistance Bed Mobility: Rolling;Sidelying to Sit;Sit to Sidelying Rolling: Min guard;Supervision Sidelying to sit: Min guard     Sit to sidelying: Min assist General bed mobility comments: cues to log roll, min/guard to elevate trunk. min assist to bring LEs onto bed  Transfers Overall transfer level: Needs assistance Equipment used: Rolling walker (2 wheeled) Transfers: Sit to/from Stand Sit to Stand: Min guard         General transfer comment: pt did not want to don abd binder. min/guard to transition to RW,  cues to power up/down with LEs  Ambulation/Gait Ambulation/Gait assistance: Supervision Gait Distance (Feet): 85 Feet Assistive device: Rolling walker (2 wheeled) Gait Pattern/deviations: Step-through pattern;Decreased step length - right;Decreased step length - left Gait velocity: decreased   General Gait Details: cues for posture, RW position   Stairs             Wheelchair Mobility    Modified Rankin (Stroke Patients Only)       Balance                                            Cognition Arousal/Alertness: Awake/alert Behavior  During Therapy: WFL for tasks assessed/performed Overall Cognitive Status: Within Functional Limits for tasks assessed                                        Exercises      General Comments        Pertinent Vitals/Pain Pain Assessment: Faces Faces Pain Scale: Hurts little more Pain Location: abd Pain Descriptors / Indicators: Grimacing;Sore Pain Intervention(s): Limited activity within patient's tolerance;Monitored during session    Home Living                      Prior Function            PT Goals (current goals can now be found in the care plan section) Acute Rehab PT Goals Patient Stated Goal: go home with husband's support PT Goal Formulation: With patient Time For Goal Achievement: 06/22/19 Potential to Achieve Goals: Good Progress towards PT goals: Progressing toward goals    Frequency    Min 3X/week      PT Plan Current plan remains appropriate    Co-evaluation              AM-PAC PT "6 Clicks" Mobility   Outcome Measure  Help needed turning from your back to your side while in a flat bed without using bedrails?:  A Little Help needed moving from lying on your back to sitting on the side of a flat bed without using bedrails?: A Little Help needed moving to and from a bed to a chair (including a wheelchair)?: A Little Help needed standing up from a chair using your arms (e.g., wheelchair or bedside chair)?: A Little Help needed to walk in hospital room?: A Little Help needed climbing 3-5 steps with a railing? : A Little 6 Click Score: 18    End of Session Equipment Utilized During Treatment: Gait belt Activity Tolerance: Patient tolerated treatment well;Patient limited by fatigue Patient left: in bed;with call bell/phone within reach;with family/visitor present Nurse Communication: Mobility status;Patient requests pain meds PT Visit Diagnosis: Muscle weakness (generalized) (M62.81);Difficulty in walking, not elsewhere  classified (R26.2)     Time: [id number] PT Time Calculation (min) (ACUTE ONLY): 17 min  Charges:  $Gait Training: 8-22 mins                     Kenyon Ana, PT  Pager: [phone] Acute Rehab Dept [facility]): [phone]   06/09/2019    [facility] 06/09/2019, 12:24 PM

## 2019-06-09 NOTE — Telephone Encounter (Signed)
Can you let Paula Peterson know when you call to check on her on Monday that she can keep her follow up appt as is on Jan 7 and then we will get her back to see Dr. Alvy Bimler as well

## 2019-06-09 NOTE — Progress Notes (Signed)
Normal labs, normal vital signs, normal abdominal xray series with no obstruction or dilated bowel. No pathologic explanation for pain or concerning findings.  Pain is likely consistent with normal postop healing.  Will discharge to home.  Thereasa Solo, MD

## 2019-06-09 NOTE — Discharge Summary (Signed)
Physician Discharge Summary  Patient ID: Paula Peterson MRN: [id number] DOB/AGE: 76/14/1942 76 y.o.  Admit date: 06/06/2019 Discharge date: 06/09/2019  Admission Diagnoses: Ovarian cancer [facility])  Discharge Diagnoses:  Principal Problem:   Ovarian cancer [facility]) Active Problems:   PE (pulmonary thromboembolism) ([place])   Discharged Condition: good  Hospital Course:  1/ patient was admitted on 06/06/19 for an exploratory laparotomy right salpingo-oophorectomy and omentectomy with radical tumor debulking for ovarian cancer. 2/ surgery was uncomplicated  3/ on postoperative day 3 the patient was meeting discharge criteria: tolerating PO, voiding urine, ambulating, pain well controlled on oral medications.  4/ new medications on discharge include Lovenox for total of 1 week postop after which time she will transition to her Eliquis, oxycodone, Tylenol and ibuprofen.  She was experiencing increased postoperative pain throughout her stay, however vital signs, labs, and imaging were all unremarkable for postoperative complication or sequelae.  Consults: None  Significant Diagnostic Studies: labs:  CBC    Component Value Date/Time   WBC 5.1 06/09/2019 1037   RBC 2.74 (L) 06/09/2019 1037   HGB 9.4 (L) 06/09/2019 1037   HGB 9.1 (L) 05/08/2019 1136   HCT 29.4 (L) 06/09/2019 1037   PLT 227 06/09/2019 1037   PLT 117 (L) 05/08/2019 1136   MCV 107.3 (H) 06/09/2019 1037   MCH 34.3 (H) 06/09/2019 1037   MCHC 32.0 06/09/2019 1037   RDW 15.0 06/09/2019 1037   LYMPHSABS 1.5 05/08/2019 1136   MONOABS 0.3 05/08/2019 1136   EOSABS 0.0 05/08/2019 1136   BASOSABS 0.0 05/08/2019 1136   BMET    Component Value Date/Time   NA 135 06/09/2019 1037   K 4.2 06/09/2019 1037   CL 100 06/09/2019 1037   CO2 26 06/09/2019 1037   GLUCOSE 113 (H) 06/09/2019 1037   BUN 15 06/09/2019 1037   CREATININE 0.80 06/09/2019 1037   CREATININE 0.96 05/08/2019 1136   CALCIUM 9.7 06/09/2019 1037   GFRNONAA >60  06/09/2019 1037   GFRNONAA 57 (L) 05/08/2019 1136   GFRAA >60 06/09/2019 1037   GFRAA >60 05/08/2019 1136    and abdominal x-ray revealed gas-filled nondilated small and large intestine without obstruction or ileus.  Treatments: surgery: see above  Discharge Exam: Blood pressure (!) 109/49, pulse 79, temperature 98.6 F (37 C), temperature source Oral, resp. rate 14, height 5\' 7"  (1.702 m), weight 212 lb 9.6 oz (96.4 kg), SpO2 93 %. General appearance: alert and cooperative GI: soft, non-tender; bowel sounds normal; no masses,  no organomegaly incision in tact with dressing  Disposition: Discharge disposition: 01-Home or Self Care       Discharge Instructions    (HEART FAILURE PATIENTS) Call MD:  Anytime you have any of the following symptoms: 1) 3 pound weight gain in 24 hours or 5 pounds in 1 week 2) shortness of breath, with or without a dry hacking cough 3) swelling in the hands, feet or stomach 4) if you have to sleep on extra pillows at night in order to breathe.   Complete by: As directed    Call MD for:  difficulty breathing, headache or visual disturbances   Complete by: As directed    Call MD for:  extreme fatigue   Complete by: As directed    Call MD for:  hives   Complete by: As directed    Call MD for:  persistant dizziness or light-headedness   Complete by: As directed    Call MD for:  persistant nausea and vomiting  Complete by: As directed    Call MD for:  redness, tenderness, or signs of infection (pain, swelling, redness, odor or green/yellow discharge around incision site)   Complete by: As directed    Call MD for:  severe uncontrolled pain   Complete by: As directed    Call MD for:  temperature >100.4   Complete by: As directed    Diet - low sodium heart healthy   Complete by: As directed    Diet general   Complete by: As directed    Driving Restrictions   Complete by: As directed    No driving for 7 days or until off narcotic pain medication    Increase activity slowly   Complete by: As directed    Remove dressing in 24 hours   Complete by: As directed    Sexual Activity Restrictions   Complete by: As directed    No intercourse for 6 weeks     Allergies as of 06/09/2019   No Known Allergies     Medication List    TAKE these medications   acetaminophen 325 MG tablet Commonly known as: TYLENOL Take 650 mg by mouth every 6 (six) hours as needed for moderate pain or headache.   apixaban 5 MG Tabs tablet Commonly known as: ELIQUIS Take 1 tablet (5 mg total) by mouth 2 (two) times daily. Do not resume until you have completed lovenox injections.  Plan to resume on 06/14/2019 Start taking on: June 14, 2019 What changed:   additional instructions  These instructions start on June 14, 2019. If you are unsure what to do until then, ask your doctor or other care provider.   enoxaparin 40 MG/0.4ML injection Commonly known as: LOVENOX Inject 0.4 mLs (40 mg total) into the skin daily for 4 doses. Start taking on: June 10, 2019   esomeprazole 40 MG capsule Commonly known as: NEXIUM Take 40 mg by mouth 2 (two) times daily.   ibuprofen 600 MG tablet Commonly known as: ADVIL Take 1 tablet (600 mg total) by mouth every 6 (six) hours as needed for moderate pain. For AFTER surgery   levothyroxine 50 MCG tablet Commonly known as: SYNTHROID TAKE ONE TABLET EVERY MORNING What changed: when to take this   lidocaine-prilocaine cream Commonly known as: EMLA Apply to affected area once What changed:   how much to take  how to take this  when to take this  reasons to take this  additional instructions   losartan 50 MG tablet Commonly known as: COZAAR Take 50 mg by mouth daily.   oxyCODONE 5 MG immediate release tablet Commonly known as: Oxy IR/ROXICODONE Take 1-2 tablets (5-10 mg total) by mouth every 4 (four) hours as needed for severe pain. Do not take and drive   polyethylene glycol 17 g  packet Commonly known as: MIRALAX / GLYCOLAX Take 17 g by mouth daily as needed for mild constipation.   senna-docusate 8.6-50 MG tablet Commonly known as: Senokot-S Take 1-2 tablets by mouth at bedtime. For AFTER surgery, do not take if having diarrhea      Follow-up Information    Everitt Amber, MD Follow up on 07/06/2019.   Specialty: Gynecologic Oncology Why: at 4pm at the [facility] information: [place] [address] [phone]           Signed: Thereasa Solo 06/09/2019, 1:30 PM

## 2019-06-12 ENCOUNTER — Telehealth: Payer: Self-pay

## 2019-06-12 NOTE — Telephone Encounter (Signed)
Ms Paula Peterson is doing ok. She has not moved her bowels since 06-06-19. Using sena at hs and miralax daily. Has some nausea last eve and vomited coffee this am . Bloated. passed gas last eve. Told her to take a second dose of miralx and I would ask you if Mag citrate would be ok. Pt asked about an enema but did not fell that would be appropriate post op for her. She is cutting down on Oxycontin. She is using 2 at hs. Suggested one. Taking tylenol and Ibuprofen. She feels she would feel much better after a good BM  Abdominal incision D&I. Afebrile

## 2019-06-12 NOTE — Telephone Encounter (Signed)
Told Paula Peterson to take a half a bottle of Mag-citrate.  If no goo evacuation of stool in 3 hrs, she needs to drink the other half. Pt verbalized understanding.

## 2019-06-13 NOTE — Telephone Encounter (Signed)
Told Paula Peterson about the appointment for 07-05-18 as noted below by [name], NP. Pt had a good evacuation of stool after the second half of the mag-citrate yesterday. Pt reports that she feels a whole lot better today.

## 2019-06-15 ENCOUNTER — Telehealth: Payer: Self-pay | Admitting: Oncology

## 2019-06-15 ENCOUNTER — Other Ambulatory Visit: Payer: Self-pay | Admitting: Hematology and Oncology

## 2019-06-15 NOTE — Telephone Encounter (Signed)
Called Paula Peterson and discussed having her labs drawn before her appointment with Dr. Denman George on 07/06/2019 and then an appointment with Dr. Alvy Bimler and chemotherapy on 07/07/2019.  She said that works for her and to just let her know the appointment times.  Advised her that a scheduler will call with the appointments.

## 2019-06-15 NOTE — Telephone Encounter (Signed)
Scheduling msg sent 

## 2019-06-19 ENCOUNTER — Other Ambulatory Visit: Payer: Self-pay

## 2019-06-19 DIAGNOSIS — C569 Malignant neoplasm of unspecified ovary: Secondary | ICD-10-CM

## 2019-06-19 DIAGNOSIS — I2699 Other pulmonary embolism without acute cor pulmonale: Secondary | ICD-10-CM

## 2019-06-19 MED ORDER — APIXABAN 5 MG PO TABS: 5.0000 mg | ORAL_TABLET | Freq: Two times a day (BID) | ORAL | 0 refills | Status: DC

## 2019-06-19 NOTE — Telephone Encounter (Signed)
Paula John, NP will refill Eliquis for one month. Refills to Dr. Christiana Pellant for further refills.

## 2019-06-20 ENCOUNTER — Telehealth: Payer: Self-pay | Admitting: Hematology and Oncology

## 2019-06-20 NOTE — Telephone Encounter (Signed)
Scheduled appt per 12/17 schmessage - pt is aware of appt date and time   

## 2019-06-21 ENCOUNTER — Other Ambulatory Visit: Payer: Self-pay | Admitting: Licensed Clinical Social Worker

## 2019-06-21 DIAGNOSIS — C569 Malignant neoplasm of unspecified ovary: Secondary | ICD-10-CM

## 2019-06-26 ENCOUNTER — Other Ambulatory Visit: Payer: Self-pay | Admitting: Oncology

## 2019-06-26 NOTE — Progress Notes (Signed)
Gynecologic Oncology Multi-Disciplinary Disposition Conference Note  Date of the Conference: 06/26/2019  Patient Name: Paula Peterson [facility]  Referring Provider: Dr. Benjie Karvonen Primary GYN Oncologist: Dr. Denman George  Stage/Disposition:  Stage IV ovarian cancer. Disposition is for chemotherapy with carboplatin/Taxol.   This Multidisciplinary conference took place involving physicians from [place], [place], Radiation Oncology, Pathology, Radiology along with the Gynecologic Oncology Nurse Practitioner and RN.  Comprehensive assessment of the patient's malignancy, staging, need for surgery, chemotherapy, radiation therapy, and need for further testing were reviewed. Supportive measures, both inpatient and following discharge were also discussed. The recommended plan of care is documented. Greater than 35 minutes were spent correlating and coordinating this patient's care.

## 2019-07-04 ENCOUNTER — Other Ambulatory Visit: Payer: Self-pay | Admitting: Gynecologic Oncology

## 2019-07-04 DIAGNOSIS — C569 Malignant neoplasm of unspecified ovary: Secondary | ICD-10-CM

## 2019-07-06 ENCOUNTER — Inpatient Hospital Stay (HOSPITAL_BASED_OUTPATIENT_CLINIC_OR_DEPARTMENT_OTHER): Payer: Medicare Other | Admitting: Gynecologic Oncology

## 2019-07-06 ENCOUNTER — Inpatient Hospital Stay: Payer: Medicare Other | Attending: Gynecologic Oncology

## 2019-07-06 ENCOUNTER — Other Ambulatory Visit: Payer: Self-pay

## 2019-07-06 ENCOUNTER — Encounter: Payer: Self-pay | Admitting: Gynecologic Oncology

## 2019-07-06 VITALS — BP 125/61 | HR 85 | Temp 98.2°F | Resp 17 | Ht 67.0 in | Wt 208.1 lb

## 2019-07-06 DIAGNOSIS — I2699 Other pulmonary embolism without acute cor pulmonale: Secondary | ICD-10-CM | POA: Diagnosis not present

## 2019-07-06 DIAGNOSIS — Z79899 Other long term (current) drug therapy: Secondary | ICD-10-CM | POA: Insufficient documentation

## 2019-07-06 DIAGNOSIS — D61818 Other pancytopenia: Secondary | ICD-10-CM | POA: Insufficient documentation

## 2019-07-06 DIAGNOSIS — C561 Malignant neoplasm of right ovary: Secondary | ICD-10-CM

## 2019-07-06 DIAGNOSIS — C786 Secondary malignant neoplasm of retroperitoneum and peritoneum: Secondary | ICD-10-CM | POA: Insufficient documentation

## 2019-07-06 DIAGNOSIS — T451X5A Adverse effect of antineoplastic and immunosuppressive drugs, initial encounter: Secondary | ICD-10-CM | POA: Diagnosis not present

## 2019-07-06 DIAGNOSIS — Z5111 Encounter for antineoplastic chemotherapy: Secondary | ICD-10-CM | POA: Insufficient documentation

## 2019-07-06 DIAGNOSIS — Z7189 Other specified counseling: Secondary | ICD-10-CM

## 2019-07-06 DIAGNOSIS — C800 Disseminated malignant neoplasm, unspecified: Secondary | ICD-10-CM

## 2019-07-06 DIAGNOSIS — Z7901 Long term (current) use of anticoagulants: Secondary | ICD-10-CM | POA: Insufficient documentation

## 2019-07-06 DIAGNOSIS — G620 Drug-induced polyneuropathy: Secondary | ICD-10-CM | POA: Diagnosis not present

## 2019-07-06 DIAGNOSIS — C569 Malignant neoplasm of unspecified ovary: Secondary | ICD-10-CM

## 2019-07-06 LAB — SAMPLE TO BLOOD BANK

## 2019-07-06 LAB — CBC WITH DIFFERENTIAL (CANCER CENTER ONLY)
Abs Immature Granulocytes: 0 10*3/uL (ref 0.00–0.07)
Basophils Absolute: 0 10*3/uL (ref 0.0–0.1)
Basophils Relative: 1 %
Eosinophils Absolute: 0.1 10*3/uL (ref 0.0–0.5)
Eosinophils Relative: 2 %
HCT: 35.5 % — ABNORMAL LOW (ref 36.0–46.0)
Hemoglobin: 11.7 g/dL — ABNORMAL LOW (ref 12.0–15.0)
Immature Granulocytes: 0 %
Lymphocytes Relative: 42 %
Lymphs Abs: 2.2 10*3/uL (ref 0.7–4.0)
MCH: 33.7 pg (ref 26.0–34.0)
MCHC: 33 g/dL (ref 30.0–36.0)
MCV: 102.3 fL — ABNORMAL HIGH (ref 80.0–100.0)
Monocytes Absolute: 0.6 10*3/uL (ref 0.1–1.0)
Monocytes Relative: 11 %
Neutro Abs: 2.4 10*3/uL (ref 1.7–7.7)
Neutrophils Relative %: 44 %
Platelet Count: 257 10*3/uL (ref 150–400)
RBC: 3.47 MIL/uL — ABNORMAL LOW (ref 3.87–5.11)
RDW: 13.9 % (ref 11.5–15.5)
WBC Count: 5.3 10*3/uL (ref 4.0–10.5)
nRBC: 0 % (ref 0.0–0.2)

## 2019-07-06 LAB — GENETIC SCREENING ORDER

## 2019-07-06 LAB — CMP (CANCER CENTER ONLY)
ALT: 27 U/L (ref 0–44)
AST: 19 U/L (ref 15–41)
Albumin: 4.2 g/dL (ref 3.5–5.0)
Alkaline Phosphatase: 83 U/L (ref 38–126)
Anion gap: 10 (ref 5–15)
BUN: 21 mg/dL (ref 8–23)
CO2: 27 mmol/L (ref 22–32)
Calcium: 10.1 mg/dL (ref 8.9–10.3)
Chloride: 103 mmol/L (ref 98–111)
Creatinine: 0.91 mg/dL (ref 0.44–1.00)
GFR, Est AFR Am: >60 mL/min (ref >60)
GFR, Estimated: >60 mL/min (ref >60)
Glucose, Bld: 98 mg/dL (ref 70–99)
Potassium: 4.4 mmol/L (ref 3.5–5.1)
Sodium: 140 mmol/L (ref 135–145)
Total Bilirubin: <0.2 mg/dL — ABNORMAL LOW (ref 0.3–1.2)
Total Protein: 7.6 g/dL (ref 6.5–8.1)

## 2019-07-06 NOTE — Patient Instructions (Signed)
You have healed adequately from your surgery. You are cleared to restart chemo with Dr Alvy Bimler for 3 more cycles.  Please ask Dr Alvy Bimler about the logistics of getting the COVID vaccine while you are receiving treatment.   You are cleared to resume all exercise and activity at home.

## 2019-07-06 NOTE — Progress Notes (Signed)
Follow-up Note: Gyn-Onc  Consult was initially requested by Dr. Benjie Karvonen for the evaluation of Paula Peterson 79 y.o. female  CC:  Chief Complaint  Patient presents with  . Malignant neoplasm of right ovary [facility])   Assessment/Plan:  Paula Peterson  is a 79 y.o.  year old with stage IV ovarian cancer s/p 7 cycles of neoadjuvant chemotherapy (extended due to development of a PE) and has had a partial response. S/p interval debulking surgery on 06/06/19 with ex lap, BSO, omentectomy to R0.  Recommend 3 additional cycles of chemotherapy given the disease burden at time of interval debulking after 7 cycles.  Recommend genetics testing and PARPi maintenance if HRD positive.   She is cleared from a postop standpoint to resume chemotherapy.   HPI: Paula Peterson is a 79 year old P4 who is seen in consultation at the request of Dr Benjie Karvonen for apparent stage IV ovarian cancer.   Her symptoms began in April/May, 2020.  She has bloating and early satiety. Shortness of breath with walking. She denied bleeding. She reported constipation with pain with defecation and narrowed stools.   CT abd/pelvis on 11/29/18 showed a moderate size right pleural effusion, a 1.1cm porta hepatis lymph node and a 18.4 x 9.8 cm cystic mass in the pelvis. There was only a scant amount of pelvic ascites present.   CA 125 was drawn on 12/05/18 and 1015  The ptaient's medical history is significant for lumbago, "weak, loose vocal cords", reflux. She had a total hysterectomy via laparotomy for menorrhagia in her 30's. Prior to that she'd had 4 vaginal deliveries.   She underwent thoracentesis to establish diagnosis on December 09, 2018. This revealed adenocarcinoma positive for CK7 and PAX 8 - for CK20 consistent with gynecologic primary.  She was started on carboplatin and paclitaxel chemotherapy on December 16, 2018.  Ca1 25 reduced to 948 on January 06, 2019 demonstrating response.  She developed a massive PE on 02/02/19 and was treated  with admission to hospital and anticoagulant therapy at approximately cycle 3.   She was changed to Eliquis.  Due to the development of this massive PE she was considered not a good candidate for an interval debulking at that time.  She continued on to receive an additional 4 cycles of carboplatin paclitaxel with cycle 7 administered on April 28, 2019.  Most recent CA 125 on 04/28/19 was 56. This was after 7 cycles.   CT chest /abd/pelvis on May 05, 2019 showed partial response with interval decrease in the size of the large cystic mass in the central pelvic region, there was no substantial change in the mesenteric and omental soft tissue disease seen.  There was mild right hydronephrosis completely resolved in the interval.  There was no ascites.   Interval Hx:   On June 06, 2019 she underwent ex lap BSO omentectomy radical tumor debulking.  Intraoperative findings were significant for an omental cake adherent to the anterior abdominal wall and hepatic flexure.  10 cm right tube and ovary.  Surgically absent uterus and left tube and ovary.  There was granular nodularity across the right diaphragm.  The surgery was an optimal cytoreduction to R0 with no gross residual disease remaining.  The patient tolerated procedure well.  She resumed Eliquis postoperatively and had no adverse bleeding events.  Final pathology confirmed high-grade serous carcinoma with the primary site of the right ovary.  Based on staging information from initial diagnosis she was staged as stage IVa.  Current Meds:  Outpatient Encounter Medications as of 07/06/2019  Medication Sig  . acetaminophen (TYLENOL) 325 MG tablet Take 650 mg by mouth every 6 (six) hours as needed for moderate pain or headache.   Marland Kitchen apixaban (ELIQUIS) 5 MG TABS tablet Take 1 tablet (5 mg total) by mouth 2 (two) times daily.  Marland Kitchen esomeprazole (NEXIUM) 40 MG capsule Take 40 mg by mouth 2 (two) times daily.   Marland Kitchen levothyroxine (SYNTHROID) 50 MCG  tablet TAKE ONE TABLET EVERY MORNING (Patient taking differently: Take 50 mcg by mouth daily before breakfast. )  . lidocaine-prilocaine (EMLA) cream Apply to affected area once (Patient taking differently: Apply 1 application topically daily as needed (port access). )  . losartan (COZAAR) 50 MG tablet Take 50 mg by mouth daily.  . Melatonin 3 MG TABS Take by mouth.  . SENEXON-S 8.6-50 MG tablet TAKE 1 TO 2 TABLETS AT BEDTIME FOR AFTER SURGERY (DO NOT TAKE IF HAVING DIARRHEA)  . enoxaparin (LOVENOX) 40 MG/0.4ML injection Inject 0.4 mLs (40 mg total) into the skin daily for 4 doses.  . [DISCONTINUED] ibuprofen (ADVIL) 600 MG tablet Take 1 tablet (600 mg total) by mouth every 6 (six) hours as needed for moderate pain. For AFTER surgery  . [DISCONTINUED] oxyCODONE (OXY IR/ROXICODONE) 5 MG immediate release tablet Take 1-2 tablets (5-10 mg total) by mouth every 4 (four) hours as needed for severe pain. Do not take and drive (Patient not taking: Reported on 07/06/2019)  . [DISCONTINUED] polyethylene glycol (MIRALAX / GLYCOLAX) 17 g packet Take 17 g by mouth daily as needed for mild constipation.    No facility-administered encounter medications on file as of 07/06/2019.    Allergy: No Known Allergies  Social Hx:   Social History   Socioeconomic History  . Marital status: Married    Spouse name: Fara Olden  . Number of children: 3  . Years of education: Not on file  . Highest education level: Not on file  Occupational History  . Occupation: retired Oncologist  Tobacco Use  . Smoking status: Never Smoker  . Smokeless tobacco: Never Used  Substance and Sexual Activity  . Alcohol use: No  . Drug use: No  . Sexual activity: Not Currently  Other Topics Concern  . Not on file  Social History Narrative  . Not on file   Social Determinants of Health   Financial Resource Strain:   . Difficulty of Paying Living Expenses: Not on file  Food Insecurity:   . Worried About Charity fundraiser in the  Last Year: Not on file  . Ran Out of Food in the Last Year: Not on file  Transportation Needs:   . Lack of Transportation (Medical): Not on file  . Lack of Transportation (Non-Medical): Not on file  Physical Activity:   . Days of Exercise per Week: Not on file  . Minutes of Exercise per Session: Not on file  Stress:   . Feeling of Stress : Not on file  Social Connections:   . Frequency of Communication with Friends and Family: Not on file  . Frequency of Social Gatherings with Friends and Family: Not on file  . Attends Religious Services: Not on file  . Active Member of Clubs or Organizations: Not on file  . Attends Archivist Meetings: Not on file  . Marital Status: Not on file  Intimate Partner Violence:   . Fear of Current or Ex-Partner: Not on file  . Emotionally Abused: Not on file  .  Physically Abused: Not on file  . Sexually Abused: Not on file    Past Surgical Hx:  Past Surgical History:  Procedure Laterality Date  . ABDOMINAL HYSTERECTOMY     ovaries left  . BACK SURGERY    . DEBULKING Bilateral 06/06/2019   Procedure: RADICAL TUMOR DEBULKING;  Surgeon: Everitt Amber, MD;  Location: [facility];  Service: Gynecology;  Laterality: Bilateral;  . FRACTURE SURGERY    . IR IMAGING GUIDED PORT INSERTION  12/15/2018  . OMENTECTOMY Bilateral 06/06/2019   Procedure: OMENTECTOMY;  Surgeon: Everitt Amber, MD;  Location: [facility];  Service: Gynecology;  Laterality: Bilateral;  . SALPINGOOPHORECTOMY Bilateral 06/06/2019   Procedure: EXPLORATORY LAPAROTOMY,OPEN RIGHT SALPINGO OOPHORECTOMY;  Surgeon: Everitt Amber, MD;  Location: [facility];  Service: Gynecology;  Laterality: Bilateral;    Past Medical Hx:  Past Medical History:  Diagnosis Date  . Back pain   . DVT (deep venous thrombosis) ([place]) 02/02/2019   had DVT first that moved into lung  . Family history of stomach cancer   . Family history of stomach cancer   . GERD (gastroesophageal reflux disease)   . History of pulmonary  embolus (PE) 02/02/2019   confirmed by CT chest- had DVT first that moved into lungs per patient  . Hypertension   . Pessary maintenance   . Stroke [facility])     Past Gynecological History:  SVD x 4, hysterectomy for benign menorrhagia in her 30's (total abdominal) No LMP recorded. Patient has had a hysterectomy.  Family Hx:  Family History  Problem Relation Age of Onset  . Stomach cancer Sister        dx 17s-50s  . Dementia Mother   . Cancer Sister        unk type, hysterectomy, dx 2s    Sister had ovarian cancer age 62's. She was treated with surgery and chemotherapy. She survived her cancer but died from CHF and DM complications.   Review of Systems:  Constitutional  Feels fatigued   ENT Normal appearing ears and nares bilaterally Skin/Breast  No rash, sores, jaundice, itching, dryness Cardiovascular  No chest pain, no shortness of breath on exertion, no edema  Pulmonary  No cough or wheeze.  Gastro Intestinal  No nausea, vomitting, or diarrhoea. No bright red blood per rectum, no abdominal pain, change in bowel movement, no bloating Genito Urinary  No frequency, urgency, dysuria, no pelvic pressure Musculo Skeletal  No myalgia, arthralgia, joint swelling or pain  Neurologic  No weakness, numbness, change in gait,  Psychology  No depression, anxiety, insomnia.   Vitals:  Blood pressure 125/61, pulse 85, temperature 98.2 F (36.8 C), temperature source Temporal, resp. rate 17, height _0  (1.702 m), weight 208 lb 1.6 oz (94.4 kg), SpO2 100 %.  Physical Exam: WD in NAD Neck  Supple NROM, without any enlargements.  Lymph Node Survey No cervical supraclavicular or inguinal adenopathy Cardiovascular  Pulse normal rate, regularity and rhythm. S1 and S2 normal.  Lungs  Normal BS bilaterally Skin  No rash/lesions/breakdown  Psychiatry  Alert and oriented to person, place, and time  Abdomen  Normoactive bowel sounds, abdomen soft, non-tender and mildly obese (BMI  33) without evidence of hernia. Well healed paramedian incision.  Back No CVA tenderness Genito Urinary  deferred Rectal  Good tone, no masses no discrete cul de sac nodularity.  Extremities  No bilateral cyanosis, clubbing or edema.   30 minutes of direct face to face counseling time was spent with the patient. This  included discussion about prognosis, therapy recommendations and postoperative side effects and are beyond the scope of routine postoperative care.   Thereasa Solo, MD  07/06/2019, 11:49 AM

## 2019-07-07 ENCOUNTER — Telehealth: Payer: Self-pay | Admitting: Hematology and Oncology

## 2019-07-07 ENCOUNTER — Encounter: Payer: Self-pay | Admitting: Hematology and Oncology

## 2019-07-07 ENCOUNTER — Inpatient Hospital Stay: Payer: Medicare Other | Admitting: Hematology and Oncology

## 2019-07-07 ENCOUNTER — Other Ambulatory Visit: Payer: Self-pay | Admitting: Hematology and Oncology

## 2019-07-07 ENCOUNTER — Other Ambulatory Visit: Payer: Self-pay

## 2019-07-07 ENCOUNTER — Inpatient Hospital Stay: Payer: Medicare Other

## 2019-07-07 DIAGNOSIS — Z79899 Other long term (current) drug therapy: Secondary | ICD-10-CM | POA: Diagnosis not present

## 2019-07-07 DIAGNOSIS — D61818 Other pancytopenia: Secondary | ICD-10-CM | POA: Diagnosis not present

## 2019-07-07 DIAGNOSIS — Z5111 Encounter for antineoplastic chemotherapy: Secondary | ICD-10-CM | POA: Diagnosis not present

## 2019-07-07 DIAGNOSIS — C786 Secondary malignant neoplasm of retroperitoneum and peritoneum: Secondary | ICD-10-CM | POA: Diagnosis not present

## 2019-07-07 DIAGNOSIS — I2699 Other pulmonary embolism without acute cor pulmonale: Secondary | ICD-10-CM | POA: Diagnosis not present

## 2019-07-07 DIAGNOSIS — C561 Malignant neoplasm of right ovary: Secondary | ICD-10-CM

## 2019-07-07 DIAGNOSIS — T451X5A Adverse effect of antineoplastic and immunosuppressive drugs, initial encounter: Secondary | ICD-10-CM

## 2019-07-07 DIAGNOSIS — Z7901 Long term (current) use of anticoagulants: Secondary | ICD-10-CM

## 2019-07-07 DIAGNOSIS — G620 Drug-induced polyneuropathy: Secondary | ICD-10-CM

## 2019-07-07 LAB — CA 125: Cancer Antigen (CA) 125: 16.4 U/mL (ref 0.0–38.1)

## 2019-07-07 MED ORDER — SODIUM CHLORIDE 0.9% FLUSH
10.0000 mL | INTRAVENOUS | Status: DC | PRN
  Administered 2019-07-07: 10 mL
  Filled 2019-07-07: qty 10

## 2019-07-07 MED ORDER — SODIUM CHLORIDE 0.9 % IV SOLN
116.6667 mg/m2 | Freq: Once | INTRAVENOUS | Status: AC
  Administered 2019-07-07: 252 mg via INTRAVENOUS
  Filled 2019-07-07: qty 42

## 2019-07-07 MED ORDER — DIPHENHYDRAMINE HCL 50 MG/ML IJ SOLN
INTRAMUSCULAR | Status: AC
  Filled 2019-07-07: qty 1

## 2019-07-07 MED ORDER — SODIUM CHLORIDE 0.9 % IV SOLN
Freq: Once | INTRAVENOUS | Status: AC
  Filled 2019-07-07: qty 250

## 2019-07-07 MED ORDER — HEPARIN SOD (PORK) LOCK FLUSH 100 UNIT/ML IV SOLN
500.0000 [IU] | Freq: Once | INTRAVENOUS | Status: AC | PRN
  Administered 2019-07-07: 500 [IU]
  Filled 2019-07-07: qty 5

## 2019-07-07 MED ORDER — FAMOTIDINE IN NACL 20-0.9 MG/50ML-% IV SOLN
20.0000 mg | Freq: Once | INTRAVENOUS | Status: AC
  Administered 2019-07-07: 20 mg via INTRAVENOUS

## 2019-07-07 MED ORDER — SODIUM CHLORIDE 0.9 % IV SOLN
491.5000 mg | Freq: Once | INTRAVENOUS | Status: AC
  Administered 2019-07-07: 490 mg via INTRAVENOUS
  Filled 2019-07-07: qty 49

## 2019-07-07 MED ORDER — FAMOTIDINE IN NACL 20-0.9 MG/50ML-% IV SOLN
INTRAVENOUS | Status: AC
  Filled 2019-07-07: qty 50

## 2019-07-07 MED ORDER — SODIUM CHLORIDE 0.9 % IV SOLN
Freq: Once | INTRAVENOUS | Status: AC
  Filled 2019-07-07: qty 5

## 2019-07-07 MED ORDER — DIPHENHYDRAMINE HCL 50 MG/ML IJ SOLN
50.0000 mg | Freq: Once | INTRAMUSCULAR | Status: AC
  Administered 2019-07-07: 11:00:00 50 mg via INTRAVENOUS

## 2019-07-07 MED ORDER — PALONOSETRON HCL INJECTION 0.25 MG/5ML
INTRAVENOUS | Status: AC
  Filled 2019-07-07: qty 5

## 2019-07-07 MED ORDER — PALONOSETRON HCL INJECTION 0.25 MG/5ML
0.2500 mg | Freq: Once | INTRAVENOUS | Status: AC
  Administered 2019-07-07: 0.25 mg via INTRAVENOUS

## 2019-07-07 NOTE — Assessment & Plan Note (Signed)
She denies worsening neuropathy from recent treatment We will continue similar dose as before

## 2019-07-07 NOTE — Assessment & Plan Note (Signed)
She is doing well She will continue apixaban for few more months while on treatment

## 2019-07-07 NOTE — Assessment & Plan Note (Addendum)
She is recovering well from recent surgery Her tumor marker is now within normal limits Due to advanced stage disease, we recommend her to proceed with several more cycles of carboplatin and Taxol Due to her immunocompromise state and expected pancytopenia from treatment, I recommend she delay Covid vaccine until completion of therapy Genetic testing is pending

## 2019-07-07 NOTE — Telephone Encounter (Signed)
Scheduled appt per 1/8 sch message - pt to get an updated schedule after chemo today

## 2019-07-07 NOTE — Progress Notes (Signed)
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[facility]) She is recovering well from recent surgery Her tumor marker is now within normal limits Due to advanced stage disease, we recommend her to proceed with several more cycles of carboplatin and Taxol Due to her immunocompromise state and expected pancytopenia from treatment, I recommend she delay Covid vaccine until completion of therapy Genetic testing is pending  PE (pulmonary thromboembolism) ([place]) She is doing well She will continue apixaban for few more months while on treatment  Peripheral neuropathy due to chemotherapy [facility]) She denies worsening neuropathy from recent treatment We will continue similar dose as before   No orders of the defined types were placed in this encounter.   All questions were answered. The patient knows to call the clinic with any problems, questions or concerns. The total time spent in the appointment was 20 minutes encounter with patients including review of chart and various tests results, discussions about plan of care and coordination of care plan   [name], MD 07/07/2019 10:56 AM  INTERVAL HISTORY: Please see below for problem oriented charting. She returns to resume chemotherapy after surgery She is healing well She has persistent neuropathy but not worse No recent infection, fever or chills She resumed taking anticoagulation therapy without any bleeding complications. 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  She has bloating and early satiety. Shortness of breath with walking. She  denied bleeding. She reported constipation with pain with defecation and narrowed stools   11/29/2018 Imaging   1. 13 cm complex cystic lesion in the central pelvis, highly suspicious for ovarian cystadenocarcinoma. 2. Diffuse peritoneal carcinomatosis with mild ascites. 3. Mild lymphadenopathy in porta hepatis and right cardiophrenic angle, suspicious for metastatic disease. 4. Moderate right and tiny left pleural effusions   12/05/2018 Tumor Marker   Patient's tumor was tested for the following markers: CA-125 Results of the tumor marker test revealed 1015.   12/06/2018 Cancer Staging   Staging form: Ovary, Fallopian Tube, and Primary Peritoneal Carcinoma, AJCC 8th Edition - Clinical: FIGO Stage IVA, calculated as Stage IV (cT3c, cN1, cM1) - Signed by [name], MD on 12/06/2018   12/09/2018 Pathology Results   PLEURAL FLUID, RIGHT (SPECIMEN 1 OF 1 COLLECTED 12/09/18): - MALIGNANT CELLS CONSISTENT WITH METASTATIC ADENOCARCINOMA - SEE COMMENT Comment The neoplastic cells are positive for cytokeratin 7 and Pax-8 but negative for cytokeratin 20, TTF-1, CDX-2 and Gata-3. Overall, the phenotype is consistent with the clinical impression of gynecologic primary.    12/15/2018 Procedure   Placement of single lumen port a cath via right internal jugular vein. The catheter tip lies at the cavo-atrial junction. A power injectable port a cath was placed and is ready for immediate use   12/16/2018 -  Chemotherapy   The patient had carboplatin and taxol for chemotherapy treatment.     01/06/2019 Tumor Marker   Patient's tumor was tested for the following markers: CA-125 Results of the tumor marker test revealed 948   02/02/2019 Imaging   1. Massive pulmonary embolism, as discussed above. Given the mildly elevated RV to LV ratio of 0.95, this is  associated with increased risk of morbidity and mortality. 2. Today's study demonstrates a mixed response to therapy. Specifically, while there has been regression  of the bulky intraperitoneal metastatic disease and regression of previously noted pleural effusions, the large cystic mass in the central pelvis has increased in size compared to the prior study. 3. New right mild hydroureteronephrosis related to extrinsic compression on the distal third of the right ureter by the patient's large pelvic mass. 4. Scattered small pulmonary nodules (predominantly pleural based) appear stable compared to prior examinations. These are nonspecific but warrant continued attention on follow-up studies. 5. Aortic atherosclerosis, in addition to three-vessel coronary artery disease. Assessment for potential risk factor modification, dietary therapy or pharmacologic therapy may be warranted, if clinically indicated.   02/02/2019 - 02/04/2019 Hospital Admission   She was admitted the hospital due to DVT and PE   02/03/2019 Imaging   Bilateral venous Doppler US Right: Findings consistent with acute deep vein thrombosis involving the right femoral vein, right popliteal vein, right peroneal veins, right soleal veins, and right gastrocnemius veins. No cystic structure found in the popliteal fossa. Left: There is no evidence of deep vein thrombosis in the lower extremity. No cystic structure found in the popliteal fossa   02/17/2019 Tumor Marker   Patient's tumor was tested for the following markers: CA-125 Results of the tumor marker test revealed 127   03/10/2019 Tumor Marker   Patient's tumor was tested for the following markers: CA-125. Results of the tumor marker test revealed 87.4   03/14/2019 - 03/16/2019 Hospital Admission   She was admitted to the hospital recently for weakness   03/31/2019 Tumor Marker   Patient's tumor was tested for the following markers: CA-125 Results of the tumor marker test revealed 59.2.   04/28/2019 Tumor Marker   Patient's tumor was tested for the following markers: CA-125 Results of the tumor marker test revealed 56.8   05/05/2019 Imaging    1. Interval decrease in size of the large cystic mass in the central pelvis. Mesenteric and omental soft tissue disease shows no substantial interval change. 2. The mild right hydroureteronephrosis seen previously has resolved in the interval. 3. Small residual nonobstructive thrombus identified in the inter lobar pulmonary artery common here into the lateral wall compatible with chronicity. 4. 14 mm subtle enhancing lesion in the anterior right liver is stable. This may be vascular malformation. Attention on follow-up recommended. 5. Stable appearance of the multiple small bilateral pulmonary nodules. Continued attention on follow-up recommended. 6.  Aortic Atherosclerois (ICD10-170.0)     06/06/2019 Pathology Results   A. OMENTUM, RESECTION: - Metastatic carcinoma. B. RIGHT FALLOPIAN TUBE AND OVARY, SALPINGOOOPHORECTOMY: - High-grade serous carcinoma, spanning 9 cm. - No surface involvement identified. - Fallopian tube involved by carcinoma. - See oncology table. C. PERITONEAL NODULE, EXCISION: - Metastatic carcinoma. ONCOLOGY TABLE: OVARY or FALLOPIAN TUBE or PRIMARY PERITONEUM: Procedure: Right salpingo-oophorectomy, omental resection, and peritoneal biopsy. Specimen Integrity: Intact Tumor Site: Right ovary Ovarian Surface Involvement (required only if applicable): Not identified Tumor Size: 9 cm Histologic Type: High-grade serous carcinoma Histologic Grade: High-grade Implants (required for advanced stage serous/seromucinous borderline tumors only): Omentum, peritoneum. Other Tissue/ Organ Involvement: Right fallopian tube Largest Extrapelvic Peritoneal Focus (required only if applicable): 7.3 cm Peritoneal/Ascitic Fluid: Positive pleural fluid pre neoadjuvant therapy. Treatment Effect (required only for high-grade serous carcinomas): Probable treatment effect present. Regional Lymph Nodes: No lymph nodes submitted or found Pathologic Stage Classification (pTNM, AJCC 8th  Edition): ypT3c, ypNX Representative Tumor Block: B2  06/06/2019 Surgery   Preoperative Diagnosis: stage IV ovarian cancer, s/p neoadjuvant chemotherapy, history of recent PE.  Procedure(s) Performed: Exploratory laparotomy with right salpingo-oophorectomy, omentectomy radical tumor debulking for ovarian cancer .   Surgeon: Thereasa Solo, MD.    Operative Findings:  Omental cake adherent to anterior abdominal wall and hepatic flexure. 10cm right tube and ovary. Surgically absent uterus and left tube and ovary. Granular nodularity across right diaphragm.    This represented an optimal cytoreduction (R0) with no gross visible disease remaining.    07/06/2019 Tumor Marker   Patient's tumor was tested for the following markers:CA-125 Results of the tumor marker test revealed 16.4     REVIEW OF SYSTEMS:   Constitutional: Denies fevers, chills or abnormal weight loss Eyes: Denies blurriness of vision Ears, nose, mouth, throat, and face: Denies mucositis or sore throat Respiratory: Denies cough, dyspnea or wheezes Cardiovascular: Denies palpitation, chest discomfort or lower extremity swelling Gastrointestinal:  Denies nausea, heartburn or change in bowel habits Skin: Denies abnormal skin rashes Lymphatics: Denies new lymphadenopathy or easy bruising Behavioral/Psych: Mood is stable, no new changes  All other systems were reviewed with the patient and are negative.  I have reviewed the past medical history, past surgical history, social history and family history with the patient and they are unchanged from previous note.  ALLERGIES:  has No Known Allergies.  MEDICATIONS:  Current Outpatient Medications  Medication Sig Dispense Refill  . acetaminophen (TYLENOL) 325 MG tablet Take 650 mg by mouth every 6 (six) hours as needed for moderate pain or headache.     Marland Kitchen apixaban (ELIQUIS) 5 MG TABS tablet Take 1 tablet (5 mg total) by mouth 2 (two) times daily. 60 tablet 0  . enoxaparin  (LOVENOX) 40 MG/0.4ML injection Inject 0.4 mLs (40 mg total) into the skin daily for 4 doses. 1.6 mL 0  . esomeprazole (NEXIUM) 40 MG capsule Take 40 mg by mouth 2 (two) times daily.     Marland Kitchen levothyroxine (SYNTHROID) 50 MCG tablet TAKE ONE TABLET EVERY MORNING (Patient taking differently: Take 50 mcg by mouth daily before breakfast. ) 30 tablet 9  . lidocaine-prilocaine (EMLA) cream Apply to affected area once (Patient taking differently: Apply 1 application topically daily as needed (port access). ) 30 g 3  . losartan (COZAAR) 50 MG tablet Take 50 mg by mouth daily.    . Melatonin 3 MG TABS Take by mouth.    . SENEXON-S 8.6-50 MG tablet TAKE 1 TO 2 TABLETS AT BEDTIME FOR AFTER SURGERY (DO NOT TAKE IF HAVING DIARRHEA) 30 tablet 0   No current facility-administered medications for this visit.   Facility-Administered Medications Ordered in Other Visits  Medication Dose Route Frequency Provider Last Rate Last Admin  . 0.9 %  sodium chloride infusion   Intravenous Once Alvy Bimler, Quention Mcneill, MD      . CARBOplatin (PARAPLATIN) 490 mg in sodium chloride 0.9 % 250 mL chemo infusion  490 mg Intravenous Once Alvy Bimler, Keierra Nudo, MD      . diphenhydrAMINE (BENADRYL) injection 50 mg  50 mg Intravenous Once Alvy Bimler, Kryslyn Helbig, MD      . famotidine (PEPCID) IVPB 20 mg premix  20 mg Intravenous Once Alvy Bimler, Eloina Ergle, MD      . fosaprepitant (EMEND) 150 mg, dexamethasone (DECADRON) 12 mg in sodium chloride 0.9 % 145 mL IVPB   Intravenous Once Quinta Eimer, MD      . heparin lock flush 100 unit/mL  500 Units Intracatheter Once PRN [name], MD      .  PACLitaxel (TAXOL) 252 mg in sodium chloride 0.9 % 250 mL chemo infusion (> 80mg /m2)  BB:[id number] mg/m2 (Treatment Plan Recorded) Intravenous Once Alvy Bimler, Naaman Curro, MD      . palonosetron (ALOXI) injection 0.25 mg  0.25 mg Intravenous Once Itzelle Gains, MD      . sodium chloride flush (NS) 0.9 % injection 10 mL  10 mL Intracatheter PRN Alvy Bimler, Astin Rape, MD        PHYSICAL EXAMINATION: ECOG PERFORMANCE  STATUS: 1 - Symptomatic but completely ambulatory  Vitals:   07/07/19 1039  BP: 116/62  Pulse: (!) 105  Resp: 18  Temp: 98.3 F (36.8 C)  SpO2: 98%   Filed Weights   07/07/19 1039  Weight: 211 lb 3.2 oz (95.8 kg)    GENERAL:alert, no distress and comfortable SKIN: skin color, texture, turgor are normal, no rashes or significant lesions EYES: normal, Conjunctiva are pink and non-injected, sclera clear OROPHARYNX:no exudate, no erythema and lips, buccal mucosa, and tongue normal  NECK: supple, thyroid normal size, non-tender, without nodularity LYMPH:  no palpable lymphadenopathy in the cervical, axillary or inguinal LUNGS: clear to auscultation and percussion with normal breathing effort HEART: regular rate & rhythm and no murmurs and no lower extremity edema ABDOMEN:abdomen soft, non-tender and normal bowel sounds Musculoskeletal:no cyanosis of digits and no clubbing  NEURO: alert & oriented x 3 with fluent speech, no focal motor/sensory deficits  LABORATORY DATA:  I have reviewed the data as listed    Component Value Date/Time   NA 140 07/06/2019 1051   K 4.4 07/06/2019 1051   CL 103 07/06/2019 1051   CO2 27 07/06/2019 1051   GLUCOSE 98 07/06/2019 1051   BUN 21 07/06/2019 1051   CREATININE 0.91 07/06/2019 1051   CALCIUM 10.1 07/06/2019 1051   PROT 7.6 07/06/2019 1051   ALBUMIN 4.2 07/06/2019 1051   AST 19 07/06/2019 1051   ALT 27 07/06/2019 1051   ALKPHOS 83 07/06/2019 1051   BILITOT <0.2 (L) 07/06/2019 1051   GFRNONAA >60 07/06/2019 1051   GFRAA >60 07/06/2019 1051    No results found for: SPEP, UPEP  Lab Results  Component Value Date   WBC 5.3 07/06/2019   NEUTROABS 2.4 07/06/2019   HGB 11.7 (L) 07/06/2019   HCT 35.5 (L) 07/06/2019   MCV 102.3 (H) 07/06/2019   PLT 257 07/06/2019      Chemistry      Component Value Date/Time   NA 140 07/06/2019 1051   K 4.4 07/06/2019 1051   CL 103 07/06/2019 1051   CO2 27 07/06/2019 1051   BUN 21 07/06/2019 1051    CREATININE 0.91 07/06/2019 1051      Component Value Date/Time   CALCIUM 10.1 07/06/2019 1051   ALKPHOS 83 07/06/2019 1051   AST 19 07/06/2019 1051   ALT 27 07/06/2019 1051   BILITOT <0.2 (L) 07/06/2019 1051       RADIOGRAPHIC STUDIES: I have personally reviewed the radiological images as listed and agreed with the findings in the report. DG Abd 2 Views  Result Date: 06/09/2019 CLINICAL DATA:  Postop abdominal pain EXAM: ABDOMEN - 2 VIEW COMPARISON:  CT abdomen 05/05/2019 FINDINGS: Gas-filled loops of nondistended small bowel and colon. Gas in the rectum. No intraperitoneal free air. No pathologic calcifications. Mild basilar atelectasis at the LEFT lung base. Port in the anterior chest wall with tip in distal SVC. IMPRESSION: Gas filled bowel without evidence of obstruction. Electronically Signed   By: Suzy Bouchard M.D.   On:  06/09/2019 12:39   

## 2019-07-07 NOTE — Patient Instructions (Signed)
Cancer Center Discharge Instructions for Patients Receiving Chemotherapy  Today you received the following chemotherapy agents: paclitaxel and carboplatin.  To help prevent nausea and vomiting after your treatment, we encourage you to take your nausea medication as directed.   If you develop nausea and vomiting that is not controlled by your nausea medication, call the clinic.   BELOW ARE SYMPTOMS THAT SHOULD BE REPORTED IMMEDIATELY:  *FEVER GREATER THAN 100.5 F  *CHILLS WITH OR WITHOUT FEVER  NAUSEA AND VOMITING THAT IS NOT CONTROLLED WITH YOUR NAUSEA MEDICATION  *UNUSUAL SHORTNESS OF BREATH  *UNUSUAL BRUISING OR BLEEDING  TENDERNESS IN MOUTH AND THROAT WITH OR WITHOUT PRESENCE OF ULCERS  *URINARY PROBLEMS  *BOWEL PROBLEMS  UNUSUAL RASH Items with * indicate a potential emergency and should be followed up as soon as possible.  Feel free to call the clinic should you have any questions or concerns. The clinic phone number is [phone].  Please show the CHEMO ALERT CARD at check-in to the Emergency Department and triage nurse.   

## 2019-07-17 ENCOUNTER — Other Ambulatory Visit: Payer: Self-pay | Admitting: Gynecologic Oncology

## 2019-07-17 ENCOUNTER — Other Ambulatory Visit: Payer: Self-pay | Admitting: *Deleted

## 2019-07-17 DIAGNOSIS — I2699 Other pulmonary embolism without acute cor pulmonale: Secondary | ICD-10-CM

## 2019-07-17 MED ORDER — APIXABAN 5 MG PO TABS: 5.0000 mg | ORAL_TABLET | Freq: Two times a day (BID) | ORAL | 1 refills | Status: DC

## 2019-07-19 MED FILL — DEXAMETHASONE 4 MG TABLET: 4 | 42 days supply | Qty: 8 | Fill #1

## 2019-07-28 ENCOUNTER — Other Ambulatory Visit: Payer: Self-pay

## 2019-07-28 ENCOUNTER — Encounter: Payer: Self-pay | Admitting: Hematology and Oncology

## 2019-07-28 ENCOUNTER — Inpatient Hospital Stay: Payer: Medicare Other

## 2019-07-28 ENCOUNTER — Inpatient Hospital Stay: Payer: Medicare Other | Admitting: Hematology and Oncology

## 2019-07-28 DIAGNOSIS — Z7901 Long term (current) use of anticoagulants: Secondary | ICD-10-CM | POA: Diagnosis not present

## 2019-07-28 DIAGNOSIS — C561 Malignant neoplasm of right ovary: Secondary | ICD-10-CM

## 2019-07-28 DIAGNOSIS — D61818 Other pancytopenia: Secondary | ICD-10-CM | POA: Diagnosis not present

## 2019-07-28 DIAGNOSIS — T451X5A Adverse effect of antineoplastic and immunosuppressive drugs, initial encounter: Secondary | ICD-10-CM | POA: Diagnosis not present

## 2019-07-28 DIAGNOSIS — Z5111 Encounter for antineoplastic chemotherapy: Secondary | ICD-10-CM | POA: Diagnosis not present

## 2019-07-28 DIAGNOSIS — C786 Secondary malignant neoplasm of retroperitoneum and peritoneum: Secondary | ICD-10-CM | POA: Diagnosis not present

## 2019-07-28 DIAGNOSIS — I2699 Other pulmonary embolism without acute cor pulmonale: Secondary | ICD-10-CM

## 2019-07-28 DIAGNOSIS — G620 Drug-induced polyneuropathy: Secondary | ICD-10-CM

## 2019-07-28 DIAGNOSIS — C569 Malignant neoplasm of unspecified ovary: Secondary | ICD-10-CM

## 2019-07-28 DIAGNOSIS — Z79899 Other long term (current) drug therapy: Secondary | ICD-10-CM | POA: Diagnosis not present

## 2019-07-28 LAB — CBC WITH DIFFERENTIAL (CANCER CENTER ONLY)
Abs Immature Granulocytes: 0.01 10*3/uL (ref 0.00–0.07)
Basophils Absolute: 0 10*3/uL (ref 0.0–0.1)
Basophils Relative: 0 %
Eosinophils Absolute: 0 10*3/uL (ref 0.0–0.5)
Eosinophils Relative: 0 %
HCT: 34.9 % — ABNORMAL LOW (ref 36.0–46.0)
Hemoglobin: 11.7 g/dL — ABNORMAL LOW (ref 12.0–15.0)
Immature Granulocytes: 0 %
Lymphocytes Relative: 20 %
Lymphs Abs: 0.7 10*3/uL (ref 0.7–4.0)
MCH: 32.7 pg (ref 26.0–34.0)
MCHC: 33.5 g/dL (ref 30.0–36.0)
MCV: 97.5 fL (ref 80.0–100.0)
Monocytes Absolute: 0.1 10*3/uL (ref 0.1–1.0)
Monocytes Relative: 2 %
Neutro Abs: 2.8 10*3/uL (ref 1.7–7.7)
Neutrophils Relative %: 78 %
Platelet Count: 145 10*3/uL — ABNORMAL LOW (ref 150–400)
RBC: 3.58 MIL/uL — ABNORMAL LOW (ref 3.87–5.11)
RDW: 14.2 % (ref 11.5–15.5)
WBC Count: 3.6 10*3/uL — ABNORMAL LOW (ref 4.0–10.5)
nRBC: 0 % (ref 0.0–0.2)

## 2019-07-28 LAB — CMP (CANCER CENTER ONLY)
ALT: 43 U/L (ref 0–44)
AST: 25 U/L (ref 15–41)
Albumin: 3.8 g/dL (ref 3.5–5.0)
Alkaline Phosphatase: 94 U/L (ref 38–126)
Anion gap: 11 (ref 5–15)
BUN: 15 mg/dL (ref 8–23)
CO2: 24 mmol/L (ref 22–32)
Calcium: 9.7 mg/dL (ref 8.9–10.3)
Chloride: 103 mmol/L (ref 98–111)
Creatinine: 0.88 mg/dL (ref 0.44–1.00)
GFR, Est AFR Am: >60 mL/min (ref >60)
GFR, Estimated: >60 mL/min (ref >60)
Glucose, Bld: 199 mg/dL — ABNORMAL HIGH (ref 70–99)
Potassium: 4 mmol/L (ref 3.5–5.1)
Sodium: 138 mmol/L (ref 135–145)
Total Bilirubin: <0.2 mg/dL — ABNORMAL LOW (ref 0.3–1.2)
Total Protein: 7.3 g/dL (ref 6.5–8.1)

## 2019-07-28 LAB — SAMPLE TO BLOOD BANK

## 2019-07-28 MED ORDER — PALONOSETRON HCL INJECTION 0.25 MG/5ML
INTRAVENOUS | Status: AC
  Filled 2019-07-28: qty 5

## 2019-07-28 MED ORDER — SODIUM CHLORIDE 0.9 % IV SOLN
Freq: Once | INTRAVENOUS | Status: AC
  Filled 2019-07-28: qty 5

## 2019-07-28 MED ORDER — FAMOTIDINE IN NACL 20-0.9 MG/50ML-% IV SOLN
20.0000 mg | Freq: Once | INTRAVENOUS | Status: AC
  Administered 2019-07-28: 20 mg via INTRAVENOUS

## 2019-07-28 MED ORDER — DIPHENHYDRAMINE HCL 50 MG/ML IJ SOLN
50.0000 mg | Freq: Once | INTRAMUSCULAR | Status: AC
  Administered 2019-07-28: 50 mg via INTRAVENOUS

## 2019-07-28 MED ORDER — SODIUM CHLORIDE 0.9 % IV SOLN
116.6667 mg/m2 | Freq: Once | INTRAVENOUS | Status: AC
  Administered 2019-07-28: 252 mg via INTRAVENOUS
  Filled 2019-07-28: qty 42

## 2019-07-28 MED ORDER — SODIUM CHLORIDE 0.9 % IV SOLN
Freq: Once | INTRAVENOUS | Status: AC
  Filled 2019-07-28: qty 250

## 2019-07-28 MED ORDER — SODIUM CHLORIDE 0.9% FLUSH
10.0000 mL | Freq: Once | INTRAVENOUS | Status: AC
  Administered 2019-07-28: 10:00:00 10 mL
  Filled 2019-07-28: qty 10

## 2019-07-28 MED ORDER — PALONOSETRON HCL INJECTION 0.25 MG/5ML
0.2500 mg | Freq: Once | INTRAVENOUS | Status: AC
  Administered 2019-07-28: 11:00:00 0.25 mg via INTRAVENOUS

## 2019-07-28 MED ORDER — DIPHENHYDRAMINE HCL 50 MG/ML IJ SOLN
INTRAMUSCULAR | Status: AC
  Filled 2019-07-28: qty 1

## 2019-07-28 MED ORDER — SODIUM CHLORIDE 0.9 % IV SOLN
491.5000 mg | Freq: Once | INTRAVENOUS | Status: AC
  Administered 2019-07-28: 16:00:00 490 mg via INTRAVENOUS
  Filled 2019-07-28: qty 49

## 2019-07-28 MED ORDER — SODIUM CHLORIDE 0.9% FLUSH
10.0000 mL | INTRAVENOUS | Status: DC | PRN
  Administered 2019-07-28: 10 mL
  Filled 2019-07-28: qty 10

## 2019-07-28 MED ORDER — HEPARIN SOD (PORK) LOCK FLUSH 100 UNIT/ML IV SOLN
500.0000 [IU] | Freq: Once | INTRAVENOUS | Status: AC | PRN
  Administered 2019-07-28: 500 [IU]
  Filled 2019-07-28: qty 5

## 2019-07-28 MED ORDER — FAMOTIDINE IN NACL 20-0.9 MG/50ML-% IV SOLN
INTRAVENOUS | Status: AC
  Filled 2019-07-28: qty 50

## 2019-07-28 NOTE — Progress Notes (Signed)
[facility] [place] OFFICE PROGRESS NOTE  Patient Care Team: Caryl Bis, MD as PCP - General (Family Medicine) Awanda Mink Craige Cotta, RN as Oncology Nurse Navigator (Oncology)  ASSESSMENT & PLAN:  Right ovarian epithelial cancer [facility]) She tolerated recent chemotherapy well without side effects She will proceed with treatment as schedule I will see her again in 3 weeks for further follow-up After her next cycle of treatment, I plan to repeat imaging study for objective assessment of response to therapy  Pancytopenia, acquired [facility]) She has persistent pancytopenia due to recent chemotherapy We discussed neutropenic precaution She does not need transfusion support  PE (pulmonary thromboembolism) ([place]) She is doing well She will continue apixaban for few more months while on treatment  Peripheral neuropathy due to chemotherapy [facility]) She denies worsening neuropathy from recent treatment We will continue similar dose as before   No orders of the defined types were placed in this encounter.   All questions were answered. The patient knows to call the clinic with any problems, questions or concerns. The total time spent in the appointment was 20 minutes encounter with patients including review of chart and various tests results, discussions about plan of care and coordination of care plan   [name], MD 07/28/2019 12:56 PM  INTERVAL HISTORY: Please see below for problem oriented charting. She returns for further follow-up She tolerated last cycle of therapy well She denies worsening peripheral neuropathy No recent nausea or changes in bowel habits The patient denies any recent signs or symptoms of bleeding such as spontaneous epistaxis, hematuria or hematochezia. 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  She has bloating and early satiety. Shortness of breath with walking. She denied bleeding. She reported constipation with pain with defecation and narrowed stools   11/29/2018 Imaging   1. 13 cm complex cystic lesion in the central pelvis, highly suspicious for ovarian cystadenocarcinoma. 2. Diffuse peritoneal carcinomatosis with mild ascites. 3. Mild lymphadenopathy in porta hepatis and right cardiophrenic angle, suspicious for metastatic disease. 4. Moderate right and tiny left pleural effusions   12/05/2018 Tumor Marker   Patient's tumor was tested for the following markers: CA-125 Results of the tumor marker test revealed 1015.   12/06/2018 Cancer Staging   Staging form: Ovary, Fallopian Tube, and Primary Peritoneal Carcinoma, AJCC 8th Edition - Clinical: FIGO Stage IVA, calculated as Stage IV (cT3c, cN1, cM1) - Signed by [name], MD on 12/06/2018   12/09/2018 Pathology Results   PLEURAL FLUID, RIGHT (SPECIMEN 1 OF 1 COLLECTED 12/09/18): - MALIGNANT CELLS CONSISTENT WITH METASTATIC ADENOCARCINOMA - SEE COMMENT Comment The neoplastic cells are positive for cytokeratin 7 and Pax-8 but negative for cytokeratin 20, TTF-1, CDX-2 and Gata-3. Overall, the phenotype is consistent with the clinical impression of gynecologic primary.    12/15/2018 Procedure   Placement of single lumen port a cath via right internal jugular vein. The catheter tip lies at the cavo-atrial junction. A power injectable port a cath was placed and is ready for immediate use   12/16/2018 -  Chemotherapy   The patient had carboplatin and taxol for chemotherapy treatment.     01/06/2019 Tumor Marker   Patient's tumor was tested for the following markers: CA-125 Results of the tumor marker test revealed 948   02/02/2019  Imaging   1. Massive pulmonary embolism, as discussed above. Given the mildly elevated RV to LV ratio of 0.95, this is associated with increased  risk of morbidity and mortality. 2. Today's study demonstrates a mixed response to therapy. Specifically, while there has been regression of the bulky intraperitoneal metastatic disease and regression of previously noted pleural effusions, the large cystic mass in the central pelvis has increased in size compared to the prior study. 3. New right mild hydroureteronephrosis related to extrinsic compression on the distal third of the right ureter by the patient's large pelvic mass. 4. Scattered small pulmonary nodules (predominantly pleural based) appear stable compared to prior examinations. These are nonspecific but warrant continued attention on follow-up studies. 5. Aortic atherosclerosis, in addition to three-vessel coronary artery disease. Assessment for potential risk factor modification, dietary therapy or pharmacologic therapy may be warranted, if clinically indicated.   02/02/2019 - 02/04/2019 Hospital Admission   She was admitted the hospital due to DVT and PE   02/03/2019 Imaging   Bilateral venous Doppler US Right: Findings consistent with acute deep vein thrombosis involving the right femoral vein, right popliteal vein, right peroneal veins, right soleal veins, and right gastrocnemius veins. No cystic structure found in the popliteal fossa. Left: There is no evidence of deep vein thrombosis in the lower extremity. No cystic structure found in the popliteal fossa   02/17/2019 Tumor Marker   Patient's tumor was tested for the following markers: CA-125 Results of the tumor marker test revealed 127   03/10/2019 Tumor Marker   Patient's tumor was tested for the following markers: CA-125. Results of the tumor marker test revealed 87.4   03/14/2019 - 03/16/2019 Hospital Admission   She was admitted to the hospital recently for weakness   03/31/2019 Tumor Marker   Patient's tumor was tested for the following markers: CA-125 Results of the tumor marker test revealed 59.2.   04/28/2019 Tumor  Marker   Patient's tumor was tested for the following markers: CA-125 Results of the tumor marker test revealed 56.8   05/05/2019 Imaging   1. Interval decrease in size of the large cystic mass in the central pelvis. Mesenteric and omental soft tissue disease shows no substantial interval change. 2. The mild right hydroureteronephrosis seen previously has resolved in the interval. 3. Small residual nonobstructive thrombus identified in the inter lobar pulmonary artery common here into the lateral wall compatible with chronicity. 4. 14 mm subtle enhancing lesion in the anterior right liver is stable. This may be vascular malformation. Attention on follow-up recommended. 5. Stable appearance of the multiple small bilateral pulmonary nodules. Continued attention on follow-up recommended. 6.  Aortic Atherosclerois (ICD10-170.0)     06/06/2019 Pathology Results   A. OMENTUM, RESECTION: - Metastatic carcinoma. B. RIGHT FALLOPIAN TUBE AND OVARY, SALPINGOOOPHORECTOMY: - High-grade serous carcinoma, spanning 9 cm. - No surface involvement identified. - Fallopian tube involved by carcinoma. - See oncology table. C. PERITONEAL NODULE, EXCISION: - Metastatic carcinoma. ONCOLOGY TABLE: OVARY or FALLOPIAN TUBE or PRIMARY PERITONEUM: Procedure: Right salpingo-oophorectomy, omental resection, and peritoneal biopsy. Specimen Integrity: Intact Tumor Site: Right ovary Ovarian Surface Involvement (required only if applicable): Not identified Tumor Size: 9 cm Histologic Type: High-grade serous carcinoma Histologic Grade: High-grade Implants (required for advanced stage serous/seromucinous borderline tumors only): Omentum, peritoneum. Other Tissue/ Organ Involvement: Right fallopian tube Largest Extrapelvic Peritoneal Focus (required only if applicable): 7.3 cm Peritoneal/Ascitic Fluid: Positive pleural fluid pre neoadjuvant therapy. Treatment Effect (required only for high-grade serous carcinomas):  Probable treatment effect present.  Regional Lymph Nodes: No lymph nodes submitted or found Pathologic Stage Classification (pTNM, AJCC 8th Edition): ypT3c, ypNX Representative Tumor Block: B2   06/06/2019 Surgery   Preoperative Diagnosis: stage IV ovarian cancer, s/p neoadjuvant chemotherapy, history of recent PE.  Procedure(s) Performed: Exploratory laparotomy with right salpingo-oophorectomy, omentectomy radical tumor debulking for ovarian cancer .   Surgeon: Thereasa Solo, MD.    Operative Findings:  Omental cake adherent to anterior abdominal wall and hepatic flexure. 10cm right tube and ovary. Surgically absent uterus and left tube and ovary. Granular nodularity across right diaphragm.    This represented an optimal cytoreduction (R0) with no gross visible disease remaining.    07/06/2019 Tumor Marker   Patient's tumor was tested for the following markers:CA-125 Results of the tumor marker test revealed 16.4     REVIEW OF SYSTEMS:   Constitutional: Denies fevers, chills or abnormal weight loss Eyes: Denies blurriness of vision Ears, nose, mouth, throat, and face: Denies mucositis or sore throat Respiratory: Denies cough, dyspnea or wheezes Cardiovascular: Denies palpitation, chest discomfort or lower extremity swelling Gastrointestinal:  Denies nausea, heartburn or change in bowel habits Skin: Denies abnormal skin rashes Lymphatics: Denies new lymphadenopathy or easy bruising Behavioral/Psych: Mood is stable, no new changes  All other systems were reviewed with the patient and are negative.  I have reviewed the past medical history, past surgical history, social history and family history with the patient and they are unchanged from previous note.  ALLERGIES:  has No Known Allergies.  MEDICATIONS:  Current Outpatient Medications  Medication Sig Dispense Refill  . ondansetron (ZOFRAN) 8 MG tablet Take 8 mg by mouth every 8 (eight) hours as needed.    . prochlorperazine  (COMPAZINE) 10 MG tablet Take 10 mg by mouth every 6 (six) hours as needed.    Marland Kitchen acetaminophen (TYLENOL) 325 MG tablet Take 650 mg by mouth every 6 (six) hours as needed for moderate pain or headache.     Marland Kitchen apixaban (ELIQUIS) 5 MG TABS tablet Take 1 tablet (5 mg total) by mouth 2 (two) times daily. 60 tablet 1  . esomeprazole (NEXIUM) 40 MG capsule Take 40 mg by mouth 2 (two) times daily.     Marland Kitchen levothyroxine (SYNTHROID) 50 MCG tablet TAKE ONE TABLET EVERY MORNING (Patient taking differently: Take 50 mcg by mouth daily before breakfast. ) 30 tablet 9  . lidocaine-prilocaine (EMLA) cream Apply to affected area once (Patient taking differently: Apply 1 application topically daily as needed (port access). ) 30 g 3  . losartan (COZAAR) 50 MG tablet Take 50 mg by mouth daily.    . Melatonin 3 MG TABS Take by mouth.    . SENEXON-S 8.6-50 MG tablet TAKE 1 TO 2 TABLETS AT BEDTIME FOR AFTER SURGERY (DO NOT TAKE IF HAVING DIARRHEA) 30 tablet 0   No current facility-administered medications for this visit.   Facility-Administered Medications Ordered in Other Visits  Medication Dose Route Frequency Provider Last Rate Last Admin  . CARBOplatin (PARAPLATIN) 490 mg in sodium chloride 0.9 % 250 mL chemo infusion  490 mg Intravenous Once Alvy Bimler, Tinia Oravec, MD      . heparin lock flush 100 unit/mL  500 Units Intracatheter Once PRN Alvy Bimler, Raesean Bartoletti, MD      . PACLitaxel (TAXOL) 252 mg in sodium chloride 0.9 % 250 mL chemo infusion (> 80mg /m2)  BB:[id number] mg/m2 (Treatment Plan Recorded) Intravenous Once [name], MD 97 mL/hr at 07/28/19 1228 252 mg at 07/28/19 1228  . sodium chloride flush (  NS) 0.9 % injection 10 mL  10 mL Intracatheter PRN Alvy Bimler, Josue Falconi, MD        PHYSICAL EXAMINATION: ECOG PERFORMANCE STATUS: 1 - Symptomatic but completely ambulatory  Vitals:   07/28/19 1032  BP: (!) 141/69  Pulse: 92  Resp: 18  Temp: 98.3 F (36.8 C)  SpO2: 96%   Filed Weights   07/28/19 1032  Weight: 212 lb (96.2 kg)     GENERAL:alert, no distress and comfortable SKIN: skin color, texture, turgor are normal, no rashes or significant lesions EYES: normal, Conjunctiva are pink and non-injected, sclera clear OROPHARYNX:no exudate, no erythema and lips, buccal mucosa, and tongue normal  NECK: supple, thyroid normal size, non-tender, without nodularity LYMPH:  no palpable lymphadenopathy in the cervical, axillary or inguinal LUNGS: clear to auscultation and percussion with normal breathing effort HEART: regular rate & rhythm and no murmurs and no lower extremity edema ABDOMEN:abdomen soft, non-tender and normal bowel sounds Musculoskeletal:no cyanosis of digits and no clubbing  NEURO: alert & oriented x 3 with fluent speech, no focal motor/sensory deficits  LABORATORY DATA:  I have reviewed the data as listed    Component Value Date/Time   NA 138 07/28/2019 0950   K 4.0 07/28/2019 0950   CL 103 07/28/2019 0950   CO2 24 07/28/2019 0950   GLUCOSE 199 (H) 07/28/2019 0950   BUN 15 07/28/2019 0950   CREATININE 0.88 07/28/2019 0950   CALCIUM 9.7 07/28/2019 0950   PROT 7.3 07/28/2019 0950   ALBUMIN 3.8 07/28/2019 0950   AST 25 07/28/2019 0950   ALT 43 07/28/2019 0950   ALKPHOS 94 07/28/2019 0950   BILITOT <0.2 (L) 07/28/2019 0950   GFRNONAA >60 07/28/2019 0950   GFRAA >60 07/28/2019 0950    No results found for: SPEP, UPEP  Lab Results  Component Value Date   WBC 3.6 (L) 07/28/2019   NEUTROABS 2.8 07/28/2019   HGB 11.7 (L) 07/28/2019   HCT 34.9 (L) 07/28/2019   MCV 97.5 07/28/2019   PLT 145 (L) 07/28/2019      Chemistry      Component Value Date/Time   NA 138 07/28/2019 0950   K 4.0 07/28/2019 0950   CL 103 07/28/2019 0950   CO2 24 07/28/2019 0950   BUN 15 07/28/2019 0950   CREATININE 0.88 07/28/2019 0950      Component Value Date/Time   CALCIUM 9.7 07/28/2019 0950   ALKPHOS 94 07/28/2019 0950   AST 25 07/28/2019 0950   ALT 43 07/28/2019 0950   BILITOT <0.2 (L) 07/28/2019 0950

## 2019-07-28 NOTE — Patient Instructions (Signed)
[facility] Discharge Instructions for Patients Receiving Chemotherapy  Today you received the following chemotherapy agents Taxol, Carboplatin  To help prevent nausea and vomiting after your treatment, we encourage you to take your nausea medication as directed  If you develop nausea and vomiting that is not controlled by your nausea medication, call the clinic.   BELOW ARE SYMPTOMS THAT SHOULD BE REPORTED IMMEDIATELY:  *FEVER GREATER THAN 100.5 F  *CHILLS WITH OR WITHOUT FEVER  NAUSEA AND VOMITING THAT IS NOT CONTROLLED WITH YOUR NAUSEA MEDICATION  *UNUSUAL SHORTNESS OF BREATH  *UNUSUAL BRUISING OR BLEEDING  TENDERNESS IN MOUTH AND THROAT WITH OR WITHOUT PRESENCE OF ULCERS  *URINARY PROBLEMS  *BOWEL PROBLEMS  UNUSUAL RASH Items with * indicate a potential emergency and should be followed up as soon as possible.  Feel free to call the clinic should you have any questions or concerns. The clinic phone number is [phone].  Please show the CHEMO ALERT CARD at check-in to the Emergency Department and triage nurse.   

## 2019-07-28 NOTE — Assessment & Plan Note (Signed)
She is doing well She will continue apixaban for few more months while on treatment

## 2019-07-28 NOTE — Assessment & Plan Note (Signed)
She tolerated recent chemotherapy well without side effects She will proceed with treatment as schedule I will see her again in 3 weeks for further follow-up After her next cycle of treatment, I plan to repeat imaging study for objective assessment of response to therapy

## 2019-07-28 NOTE — Assessment & Plan Note (Signed)
She has persistent pancytopenia due to recent chemotherapy We discussed neutropenic precaution She does not need transfusion support

## 2019-07-28 NOTE — Assessment & Plan Note (Signed)
She denies worsening neuropathy from recent treatment We will continue similar dose as before

## 2019-08-11 ENCOUNTER — Encounter: Payer: Self-pay | Admitting: Licensed Clinical Social Worker

## 2019-08-11 ENCOUNTER — Ambulatory Visit: Payer: Self-pay | Admitting: Licensed Clinical Social Worker

## 2019-08-11 ENCOUNTER — Telehealth: Payer: Self-pay | Admitting: Licensed Clinical Social Worker

## 2019-08-11 DIAGNOSIS — Z800 Family history of malignant neoplasm of digestive organs: Secondary | ICD-10-CM

## 2019-08-11 DIAGNOSIS — Z1379 Encounter for other screening for genetic and chromosomal anomalies: Secondary | ICD-10-CM

## 2019-08-11 DIAGNOSIS — C561 Malignant neoplasm of right ovary: Secondary | ICD-10-CM

## 2019-08-11 NOTE — Telephone Encounter (Signed)
Revealed negative germline genetic testing. Somatic testing (HRD) was not able to be performed due to insufficient amount of tumor specimen.  Revealed that a VUS in BRCA2 was identified (trending benign on [employer]Var).  We discussed that we do not know why she has ovarian cancer or why there is cancer in the family. It could be due to a different gene that we are not testing, or something our current technology cannot pick up.  It will be important for her to keep in contact with genetics to learn if additional testing may be needed in the future.

## 2019-08-11 NOTE — Progress Notes (Signed)
HPI:  Ms. Jalloh was previously seen in the [place] clinic due to a personal and family history of cancer and concerns regarding a hereditary predisposition to cancer. Please refer to our prior cancer genetics clinic note for more information regarding our discussion, assessment and recommendations, at the time. Ms. Petralia recent genetic test results were disclosed to her, as were recommendations warranted by these results. These results and recommendations are discussed in more detail below.  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  She has bloating and early satiety. Shortness of breath with walking. She denied bleeding. She reported constipation with pain with defecation and narrowed stools   11/29/2018 Imaging   1. 13 cm complex cystic lesion in the central pelvis, highly suspicious for ovarian cystadenocarcinoma. 2. Diffuse peritoneal carcinomatosis with mild ascites. 3. Mild lymphadenopathy in porta hepatis and right cardiophrenic angle, suspicious for metastatic disease. 4. Moderate right and tiny left pleural effusions   12/05/2018 Tumor Marker   Patient's tumor was tested for the following markers: CA-125 Results of the tumor marker test revealed 1015.   12/06/2018 Cancer Staging   Staging form: Ovary, Fallopian Tube, and Primary Peritoneal Carcinoma, AJCC 8th Edition - Clinical: FIGO Stage IVA, calculated as Stage IV (cT3c, cN1, cM1) - Signed by [name], MD on 12/06/2018   12/09/2018 Pathology Results   PLEURAL FLUID, RIGHT (SPECIMEN 1 OF 1 COLLECTED 12/09/18): - MALIGNANT CELLS CONSISTENT WITH METASTATIC ADENOCARCINOMA - SEE COMMENT Comment The neoplastic cells are positive for cytokeratin 7 and Pax-8 but negative for cytokeratin 20, TTF-1, CDX-2 and Gata-3. Overall, the phenotype is consistent with the clinical impression of  gynecologic primary.    12/15/2018 Procedure   Placement of single lumen port a cath via right internal jugular vein. The catheter tip lies at the cavo-atrial junction. A power injectable port a cath was placed and is ready for immediate use   12/16/2018 -  Chemotherapy   The patient had carboplatin and taxol for chemotherapy treatment.     01/06/2019 Tumor Marker   Patient's tumor was tested for the following markers: CA-125 Results of the tumor marker test revealed 948   02/02/2019 Imaging   1. Massive pulmonary embolism, as discussed above. Given the mildly elevated RV to LV ratio of 0.95, this is associated with increased risk of morbidity and mortality. 2. Today's study demonstrates a mixed response to therapy. Specifically, while there has been regression of the bulky intraperitoneal metastatic disease and regression of previously noted pleural effusions, the large cystic mass in the central pelvis has increased in size compared to the prior study. 3. New right mild hydroureteronephrosis related to extrinsic compression on the distal third of the right ureter by the patient's large pelvic mass. 4. Scattered small pulmonary nodules (predominantly pleural based) appear stable compared to prior examinations. These are nonspecific but warrant continued attention on follow-up studies. 5. Aortic atherosclerosis, in addition to three-vessel coronary artery disease. Assessment for potential risk factor modification, dietary therapy or pharmacologic therapy may be warranted, if clinically indicated.   02/02/2019 - 02/04/2019 Hospital Admission   She was admitted the hospital due to DVT and PE   02/03/2019 Imaging   Bilateral venous Doppler US Right: Findings consistent with acute deep vein thrombosis involving the right femoral vein, right popliteal vein, right peroneal veins, right soleal veins, and right gastrocnemius veins. No cystic  structure found in the popliteal fossa. Left: There is no evidence  of deep vein thrombosis in the lower extremity. No cystic structure found in the popliteal fossa   02/17/2019 Tumor Marker   Patient's tumor was tested for the following markers: CA-125 Results of the tumor marker test revealed 127   03/10/2019 Tumor Marker   Patient's tumor was tested for the following markers: CA-125. Results of the tumor marker test revealed 87.4   03/14/2019 - 03/16/2019 Hospital Admission   She was admitted to the hospital recently for weakness   03/31/2019 Tumor Marker   Patient's tumor was tested for the following markers: CA-125 Results of the tumor marker test revealed 59.2.   04/28/2019 Tumor Marker   Patient's tumor was tested for the following markers: CA-125 Results of the tumor marker test revealed 56.8   05/05/2019 Imaging   1. Interval decrease in size of the large cystic mass in the central pelvis. Mesenteric and omental soft tissue disease shows no substantial interval change. 2. The mild right hydroureteronephrosis seen previously has resolved in the interval. 3. Small residual nonobstructive thrombus identified in the inter lobar pulmonary artery common here into the lateral wall compatible with chronicity. 4. 14 mm subtle enhancing lesion in the anterior right liver is stable. This may be vascular malformation. Attention on follow-up recommended. 5. Stable appearance of the multiple small bilateral pulmonary nodules. Continued attention on follow-up recommended. 6.  Aortic Atherosclerois (ICD10-170.0)     06/06/2019 Pathology Results   A. OMENTUM, RESECTION: - Metastatic carcinoma. B. RIGHT FALLOPIAN TUBE AND OVARY, SALPINGOOOPHORECTOMY: - High-grade serous carcinoma, spanning 9 cm. - No surface involvement identified. - Fallopian tube involved by carcinoma. - See oncology table. C. PERITONEAL NODULE, EXCISION: - Metastatic carcinoma. ONCOLOGY TABLE: OVARY or FALLOPIAN TUBE or PRIMARY PERITONEUM: Procedure: Right salpingo-oophorectomy, omental  resection, and peritoneal biopsy. Specimen Integrity: Intact Tumor Site: Right ovary Ovarian Surface Involvement (required only if applicable): Not identified Tumor Size: 9 cm Histologic Type: High-grade serous carcinoma Histologic Grade: High-grade Implants (required for advanced stage serous/seromucinous borderline tumors only): Omentum, peritoneum. Other Tissue/ Organ Involvement: Right fallopian tube Largest Extrapelvic Peritoneal Focus (required only if applicable): 7.3 cm Peritoneal/Ascitic Fluid: Positive pleural fluid pre neoadjuvant therapy. Treatment Effect (required only for high-grade serous carcinomas): Probable treatment effect present. Regional Lymph Nodes: No lymph nodes submitted or found Pathologic Stage Classification (pTNM, AJCC 8th Edition): ypT3c, ypNX Representative Tumor Block: B2   06/06/2019 Surgery   Preoperative Diagnosis: stage IV ovarian cancer, s/p neoadjuvant chemotherapy, history of recent PE.  Procedure(s) Performed: Exploratory laparotomy with right salpingo-oophorectomy, omentectomy radical tumor debulking for ovarian cancer .   Surgeon: Thereasa Solo, MD.    Operative Findings:  Omental cake adherent to anterior abdominal wall and hepatic flexure. 10cm right tube and ovary. Surgically absent uterus and left tube and ovary. Granular nodularity across right diaphragm.    This represented an optimal cytoreduction (R0) with no gross visible disease remaining.    07/06/2019 Tumor Marker   Patient's tumor was tested for the following markers:CA-125 Results of the tumor marker test revealed 16.4     FAMILY HISTORY:  We obtained a detailed, 4-generation family history.  Significant diagnoses are listed below: Family History  Problem Relation Age of Onset  . Stomach cancer Sister        dx 34s-50s  . Dementia Mother   . Cancer Sister        unk type, hysterectomy, dx 22s     Ms. Daher has  a daughter, [name], 71, and a son,  [name], 54. No history of  cancer for her children. She has 2 sisters. One of her sisters had stomach cancer in her 82s-50s, and died at 67. This sister had 3 sons, one had cancer in his 80s but she is unsure the type. Ms. Calarco's other sister also had cancer, she is unsure the type. She did have a hysterectomy and knows that it was diagnosed in her 49s. This sister is living at 1.  Ms. Lipinski's mother died at 65 due to dementia. The patient has very limited information about her maternal side. She knows she has many aunts and uncles and cousins, but does not have any information about them, or her maternal grandparents. No cancers she is aware of.  Ms. Climer's father died in his 53s by suicide. The patient has limited information about this side as well. She knows she has paternal aunts uncles and cousins, but does not know them. She also does not have information about her paternal grandparents.   Ms. Bruster is unaware of previous family history of genetic testing for hereditary cancer risks. There is no reported Ashkenazi Jewish ancestry. There is no known consanguinity.   GENETIC TEST RESULTS: Genetic testing reported out on 08/10/2019 through the [facility] CancerNext+RNAinsight cancer panel found no pathogenic mutations.   The CancerNext+RNAinsight gene panel offered by [facility] includes sequencing and rearrangement analysis for the following 36 genes: APC*, ATM*, AXIN2, BARD1, BMPR1A, BRCA1*, BRCA2*, BRIP1*, CDH1*, CDK4, CDKN2A, CHEK2*, DICER1, MLH1*, MSH2*, MSH3, MSH6*, MUTYH*, NBN, NF1*, NTHL1, PALB2*, PMS2*, PTEN*, RAD51C*, RAD51D*, RECQL, SMAD4, SMARCA4, STK11 and TP53* (sequencing and deletion/duplication); HOXB13, POLD1 and POLE (sequencing only); EPCAM and GREM1 (deletion/duplication only). DNA and RNA analyses performed for * genes.   TumorNext-HRD (somatic testing) was initially ordered but the lab was unable to complete this testing due to insufficient sample.   The test report has been scanned into EPIC and is  located under the Molecular Pathology section of the Results Review tab.  A portion of the result report is included below for reference.      We discussed with Ms. Eanes that because current genetic testing is not perfect, it is possible there may be a gene mutation in one of these genes that current testing cannot detect, but that chance is small.  We also discussed, that there could be another gene that has not yet been discovered, or that we have not yet tested, that is responsible for the cancer diagnoses in the family. It is also possible there is a hereditary cause for the cancer in the family that Ms. Seckman did not inherit and therefore was not identified in her testing.  Therefore, it is important to remain in touch with cancer genetics in the future so that we can continue to offer Ms. Simcoe the most up to date genetic testing.   Genetic testing did identify a variant of uncertain significance (VUS) was identified in the BRCA2 gene called p.[id number] .  At this time, it is unknown if this variant is associated with increased cancer risk or if this is a normal finding, but most variants such as this get reclassified to being inconsequential. It appears to be trending benign as another lab, [facility], calls this variant "likely benign."  It should not be used to make medical management decisions. With time, we suspect the lab will determine the significance of this variant, if any. If we do learn more about it, we will try to  contact Ms. Meiner to discuss it further. However, it is important to stay in touch with [place] periodically and keep the address and phone number up to date.  ADDITIONAL GENETIC TESTING: We discussed with Ms. Schnebly that her genetic testing was fairly extensive.  If there are genes identified to increase cancer risk that can be analyzed in the future, we would be happy to discuss and coordinate this testing at that time.    CANCER SCREENING RECOMMENDATIONS: Ms. Keeny's test result is  considered negative (normal).  This means that we have not identified a hereditary cause for her personal and family history of cancer at this time. Most cancers happen by chance and this negative test suggests that her cancer may fall into this category.    While reassuring, this does not definitively rule out a hereditary predisposition to cancer. It is still possible that there could be genetic mutations that are undetectable by current technology. There could be genetic mutations in genes that have not been tested or identified to increase cancer risk.  Therefore, it is recommended she continue to follow the cancer management and screening guidelines provided by her oncology and primary healthcare provider.   An individual's cancer risk and medical management are not determined by genetic test results alone. Overall cancer risk assessment incorporates additional factors, including personal medical history, family history, and any available genetic information that may result in a personalized plan for cancer prevention and surveillance.  RECOMMENDATIONS FOR FAMILY MEMBERS:  Relatives in this family might be at some increased risk of developing cancer, over the general population risk, simply due to the family history of cancer.  We recommended female relatives in this family have a yearly mammogram beginning at age 3, or 56 years younger than the earliest onset of cancer, an annual clinical breast exam, and perform monthly breast self-exams. Female relatives in this family should also have a gynecological exam as recommended by their primary provider. All family members should have a colonoscopy by age 43, or as directed by their physicians.   It is also possible there is a hereditary cause for the cancer in Ms. Connelly's family that she did not inherit and therefore was not identified in her. Based on Ms. Nahar's family history, we recommended her nieces/nephews have genetic counseling and testing since  both of her sisters had cancers in their 68s. Ms. Simmonds will let us know if we can be of any assistance in coordinating genetic counseling and/or testing for these family members.  FOLLOW-UP: Lastly, we discussed with Ms. Geffert that cancer genetics is a rapidly advancing field and it is possible that new genetic tests will be appropriate for her and/or her family members in the future. We encouraged her to remain in contact with cancer genetics on an annual basis so we can update her personal and family histories and let her know of advances in cancer genetics that may benefit this family.   Our contact number was provided. Ms. Notaro's questions were answered to her satisfaction, and she knows she is welcome to call us at anytime with additional questions or concerns.   Faith Rogue, MS, [facility] Genetic Counselor [email or web address] Phone: [phone]

## 2019-08-14 ENCOUNTER — Other Ambulatory Visit: Payer: Self-pay | Admitting: Hematology and Oncology

## 2019-08-14 DIAGNOSIS — I2699 Other pulmonary embolism without acute cor pulmonale: Secondary | ICD-10-CM

## 2019-08-17 ENCOUNTER — Telehealth: Payer: Self-pay | Admitting: *Deleted

## 2019-08-17 NOTE — Telephone Encounter (Signed)
Patient communicated that she would like to cancel appointments for 2/19 and reschedule for any day next week.   Scheduling message has been sent.

## 2019-08-18 ENCOUNTER — Other Ambulatory Visit: Payer: Medicare Other

## 2019-08-18 ENCOUNTER — Ambulatory Visit: Payer: Medicare Other | Admitting: Hematology and Oncology

## 2019-08-18 ENCOUNTER — Ambulatory Visit: Payer: Medicare Other

## 2019-08-18 ENCOUNTER — Telehealth: Payer: Self-pay | Admitting: Hematology and Oncology

## 2019-08-18 NOTE — Telephone Encounter (Signed)
Scheduled appt per 2/18 sch message - pt is aware of appt date and time   

## 2019-08-24 ENCOUNTER — Other Ambulatory Visit: Payer: Self-pay

## 2019-08-24 ENCOUNTER — Inpatient Hospital Stay: Payer: Medicare Other

## 2019-08-24 ENCOUNTER — Inpatient Hospital Stay: Payer: Medicare Other | Attending: Hematology and Oncology

## 2019-08-24 ENCOUNTER — Inpatient Hospital Stay: Payer: Medicare Other | Admitting: Hematology and Oncology

## 2019-08-24 VITALS — BP 118/58 | HR 88 | Temp 98.0°F | Resp 17 | Ht 67.0 in | Wt 214.2 lb

## 2019-08-24 DIAGNOSIS — D61818 Other pancytopenia: Secondary | ICD-10-CM

## 2019-08-24 DIAGNOSIS — Z5111 Encounter for antineoplastic chemotherapy: Secondary | ICD-10-CM | POA: Diagnosis not present

## 2019-08-24 DIAGNOSIS — C786 Secondary malignant neoplasm of retroperitoneum and peritoneum: Secondary | ICD-10-CM | POA: Diagnosis not present

## 2019-08-24 DIAGNOSIS — C561 Malignant neoplasm of right ovary: Secondary | ICD-10-CM | POA: Insufficient documentation

## 2019-08-24 DIAGNOSIS — Z9221 Personal history of antineoplastic chemotherapy: Secondary | ICD-10-CM | POA: Diagnosis not present

## 2019-08-24 DIAGNOSIS — K219 Gastro-esophageal reflux disease without esophagitis: Secondary | ICD-10-CM | POA: Insufficient documentation

## 2019-08-24 DIAGNOSIS — C569 Malignant neoplasm of unspecified ovary: Secondary | ICD-10-CM

## 2019-08-24 DIAGNOSIS — Z79899 Other long term (current) drug therapy: Secondary | ICD-10-CM | POA: Diagnosis not present

## 2019-08-24 DIAGNOSIS — I2699 Other pulmonary embolism without acute cor pulmonale: Secondary | ICD-10-CM

## 2019-08-24 DIAGNOSIS — Z7901 Long term (current) use of anticoagulants: Secondary | ICD-10-CM | POA: Diagnosis not present

## 2019-08-24 DIAGNOSIS — Z86711 Personal history of pulmonary embolism: Secondary | ICD-10-CM | POA: Diagnosis not present

## 2019-08-24 DIAGNOSIS — I10 Essential (primary) hypertension: Secondary | ICD-10-CM | POA: Insufficient documentation

## 2019-08-24 DIAGNOSIS — E039 Hypothyroidism, unspecified: Secondary | ICD-10-CM

## 2019-08-24 LAB — CBC WITH DIFFERENTIAL (CANCER CENTER ONLY)
Abs Immature Granulocytes: 0.01 10*3/uL (ref 0.00–0.07)
Basophils Absolute: 0 10*3/uL (ref 0.0–0.1)
Basophils Relative: 0 %
Eosinophils Absolute: 0 10*3/uL (ref 0.0–0.5)
Eosinophils Relative: 0 %
HCT: 31.3 % — ABNORMAL LOW (ref 36.0–46.0)
Hemoglobin: 10.7 g/dL — ABNORMAL LOW (ref 12.0–15.0)
Immature Granulocytes: 0 %
Lymphocytes Relative: 19 %
Lymphs Abs: 0.9 10*3/uL (ref 0.7–4.0)
MCH: 33.1 pg (ref 26.0–34.0)
MCHC: 34.2 g/dL (ref 30.0–36.0)
MCV: 96.9 fL (ref 80.0–100.0)
Monocytes Absolute: 0.3 10*3/uL (ref 0.1–1.0)
Monocytes Relative: 5 %
Neutro Abs: 3.7 10*3/uL (ref 1.7–7.7)
Neutrophils Relative %: 76 %
Platelet Count: 166 10*3/uL (ref 150–400)
RBC: 3.23 MIL/uL — ABNORMAL LOW (ref 3.87–5.11)
RDW: 15.8 % — ABNORMAL HIGH (ref 11.5–15.5)
WBC Count: 4.9 10*3/uL (ref 4.0–10.5)
nRBC: 0.4 % — ABNORMAL HIGH (ref 0.0–0.2)

## 2019-08-24 LAB — CMP (CANCER CENTER ONLY)
ALT: 30 U/L (ref 0–44)
AST: 18 U/L (ref 15–41)
Albumin: 3.7 g/dL (ref 3.5–5.0)
Alkaline Phosphatase: 88 U/L (ref 38–126)
Anion gap: 11 (ref 5–15)
BUN: 20 mg/dL (ref 8–23)
CO2: 25 mmol/L (ref 22–32)
Calcium: 9.8 mg/dL (ref 8.9–10.3)
Chloride: 104 mmol/L (ref 98–111)
Creatinine: 1.08 mg/dL — ABNORMAL HIGH (ref 0.44–1.00)
GFR, Est AFR Am: 58 mL/min — ABNORMAL LOW (ref >60)
GFR, Estimated: 50 mL/min — ABNORMAL LOW (ref >60)
Glucose, Bld: 155 mg/dL — ABNORMAL HIGH (ref 70–99)
Potassium: 4.1 mmol/L (ref 3.5–5.1)
Sodium: 140 mmol/L (ref 135–145)
Total Bilirubin: 0.2 mg/dL — ABNORMAL LOW (ref 0.3–1.2)
Total Protein: 7.2 g/dL (ref 6.5–8.1)

## 2019-08-24 LAB — TSH: TSH: 1.009 u[IU]/mL (ref 0.308–3.960)

## 2019-08-24 LAB — SAMPLE TO BLOOD BANK

## 2019-08-24 MED ORDER — FAMOTIDINE IN NACL 20-0.9 MG/50ML-% IV SOLN
20.0000 mg | Freq: Once | INTRAVENOUS | Status: AC
  Administered 2019-08-24: 20 mg via INTRAVENOUS

## 2019-08-24 MED ORDER — DIPHENHYDRAMINE HCL 50 MG/ML IJ SOLN
INTRAMUSCULAR | Status: AC
  Filled 2019-08-24: qty 1

## 2019-08-24 MED ORDER — HEPARIN SOD (PORK) LOCK FLUSH 100 UNIT/ML IV SOLN
500.0000 [IU] | Freq: Once | INTRAVENOUS | Status: AC | PRN
  Administered 2019-08-24: 18:00:00 500 [IU]
  Filled 2019-08-24: qty 5

## 2019-08-24 MED ORDER — SODIUM CHLORIDE 0.9 % IV SOLN
116.6667 mg/m2 | Freq: Once | INTRAVENOUS | Status: AC
  Administered 2019-08-24: 14:00:00 252 mg via INTRAVENOUS
  Filled 2019-08-24: qty 42

## 2019-08-24 MED ORDER — PALONOSETRON HCL INJECTION 0.25 MG/5ML
0.2500 mg | Freq: Once | INTRAVENOUS | Status: AC
  Administered 2019-08-24: 13:00:00 0.25 mg via INTRAVENOUS

## 2019-08-24 MED ORDER — SODIUM CHLORIDE 0.9% FLUSH
10.0000 mL | INTRAVENOUS | Status: DC | PRN
  Administered 2019-08-24: 10 mL
  Filled 2019-08-24: qty 10

## 2019-08-24 MED ORDER — SODIUM CHLORIDE 0.9 % IV SOLN
Freq: Once | INTRAVENOUS | Status: AC
  Filled 2019-08-24: qty 5

## 2019-08-24 MED ORDER — PALONOSETRON HCL INJECTION 0.25 MG/5ML
INTRAVENOUS | Status: AC
  Filled 2019-08-24: qty 5

## 2019-08-24 MED ORDER — SODIUM CHLORIDE 0.9% FLUSH
10.0000 mL | Freq: Once | INTRAVENOUS | Status: AC
  Administered 2019-08-24: 10 mL
  Filled 2019-08-24: qty 10

## 2019-08-24 MED ORDER — DIPHENHYDRAMINE HCL 50 MG/ML IJ SOLN
50.0000 mg | Freq: Once | INTRAMUSCULAR | Status: AC
  Administered 2019-08-24: 50 mg via INTRAVENOUS

## 2019-08-24 MED ORDER — FAMOTIDINE IN NACL 20-0.9 MG/50ML-% IV SOLN
INTRAVENOUS | Status: AC
  Filled 2019-08-24: qty 50

## 2019-08-24 MED ORDER — SODIUM CHLORIDE 0.9 % IV SOLN
464.5000 mg | Freq: Once | INTRAVENOUS | Status: AC
  Administered 2019-08-24: 17:00:00 460 mg via INTRAVENOUS
  Filled 2019-08-24: qty 46

## 2019-08-24 MED ORDER — SODIUM CHLORIDE 0.9 % IV SOLN
Freq: Once | INTRAVENOUS | Status: AC
  Filled 2019-08-24: qty 250

## 2019-08-24 NOTE — Patient Instructions (Signed)
[facility] Discharge Instructions for Patients Receiving Chemotherapy  Today you received the following chemotherapy agents Taxol, Carboplatin  To help prevent nausea and vomiting after your treatment, we encourage you to take your nausea medication as directed  If you develop nausea and vomiting that is not controlled by your nausea medication, call the clinic.   BELOW ARE SYMPTOMS THAT SHOULD BE REPORTED IMMEDIATELY:  *FEVER GREATER THAN 100.5 F  *CHILLS WITH OR WITHOUT FEVER  NAUSEA AND VOMITING THAT IS NOT CONTROLLED WITH YOUR NAUSEA MEDICATION  *UNUSUAL SHORTNESS OF BREATH  *UNUSUAL BRUISING OR BLEEDING  TENDERNESS IN MOUTH AND THROAT WITH OR WITHOUT PRESENCE OF ULCERS  *URINARY PROBLEMS  *BOWEL PROBLEMS  UNUSUAL RASH Items with * indicate a potential emergency and should be followed up as soon as possible.  Feel free to call the clinic should you have any questions or concerns. The clinic phone number is [phone].  Please show the CHEMO ALERT CARD at check-in to the Emergency Department and triage nurse.   

## 2019-08-25 ENCOUNTER — Encounter: Payer: Self-pay | Admitting: Hematology and Oncology

## 2019-08-25 ENCOUNTER — Telehealth: Payer: Self-pay | Admitting: Hematology and Oncology

## 2019-08-25 LAB — CA 125: Cancer Antigen (CA) 125: 12.2 U/mL (ref 0.0–38.1)

## 2019-08-25 NOTE — Assessment & Plan Note (Signed)
The patient is currently on anticoagulation therapy Due to history of severe blood clots, she will need at least 1 year of anticoagulation therapy

## 2019-08-25 NOTE — Assessment & Plan Note (Signed)
Her blood counts are improving She is not symptomatic Observe for now

## 2019-08-25 NOTE — Telephone Encounter (Signed)
Called pt per 2/26 sch message - pt and granddaughter number - no answer. Mailed letter with appt

## 2019-08-25 NOTE — Progress Notes (Signed)
[facility] OFF[facility] PROGRESS NOTE  Patient Care Team: Caryl Bis, MD as PCP - General (Family Medicine) Awanda Mink Craige Cotta, RN as Oncology Nurse Navigator (Oncology)  ASSESSMENT & PLAN:  Right ovarian epithelial cancer [facility] [place]) She tolerated recent chemotherapy well without side effects She will proceed with treatment as schedule I will see her again in 4 weeks for further follow-up I plan to repeat imaging study for objective assessment of response to therapy So far, her tumor marker has normalized and she has no new symptoms I am optimistic that she would have complete response when I see her back We discussed briefly about Covid vaccination and she should be able to receive her Covid vaccine after completion of treatment in about 3 weeks  Pancytopenia, acquired ([place]) Her blood counts are improving She is not symptomatic Observe for now  PE (pulmonary thromboembolism) ([place]) The patient is currently on anticoagulation therapy Due to history of severe blood clots, she will need at least 1 year of anticoagulation therapy   Orders Placed This Encounter  Procedures  . CT ABDOMEN PELVIS W CONTRAST    Standing Status:   Future    Standing Expiration Date:   08/23/2020    Order Specific Question:   If indicated for the ordered procedure, I authorize the administration of contrast media per Radiology protocol    Answer:   Yes    Order Specific Question:   Preferred imaging location?    Answer:   [facility]    Order Specific Question:   Radiology Contrast Protocol - do NOT remove file path    Answer:   \\[email or web address]  . CT CHEST W CONTRAST    Standing Status:   Future    Standing Expiration Date:   08/23/2020    Order Specific Question:   If indicated for the ordered procedure, I authorize the administration of contrast media per Radiology protocol    Answer:   Yes    Order Specific Question:   Preferred imaging location?    Answer:    [facility]    Order Specific Question:   Radiology Contrast Protocol - do NOT remove file path    Answer:   \\[email or web address]    All questions were answered. The patient knows to call the clinic with any problems, questions or concerns. The total time spent in the appointment was 20 minutes encounter with patients including review of chart and various tests results, discussions about plan of care and coordination of care plan   [name], MD 08/25/2019 10:14 AM  INTERVAL HISTORY: Please see below for problem oriented charting. She returns for final treatment today She is doing well Denies worsening neuropathy No recent abdominal pain, nausea or changes in bowel habits The patient denies any recent signs or symptoms of bleeding such as spontaneous epistaxis, hematuria or hematochezia.  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  She has bloating and early satiety. Shortness of breath with walking. She denied bleeding. She reported constipation with pain with defecation and narrowed stools   11/29/2018 Imaging   1. 13 cm complex cystic lesion in the central pelvis, highly suspicious for ovarian cystadenocarcinoma. 2. Diffuse peritoneal carcinomatosis with mild ascites. 3. Mild lymphadenopathy in porta hepatis and right cardiophrenic angle, suspicious for metastatic disease. 4. Moderate right and tiny left pleural effusions   12/05/2018  Tumor Marker   Patient's tumor was tested for the following markers: CA-125 Results of the tumor marker test revealed 1015.   12/06/2018 Cancer Staging   Staging form: Ovary, Fallopian Tube, and Primary Peritoneal Carcinoma, AJCC 8th Edition - Clinical: FIGO Stage IVA, calculated as Stage IV (cT3c, cN1, cM1) - Signed by [name], MD on 12/06/2018   12/09/2018 Pathology  Results   PLEURAL FLUID, RIGHT (SPECIMEN 1 OF 1 COLLECTED 12/09/18): - MALIGNANT CELLS CONSISTENT WITH METASTATIC ADENOCARCINOMA - SEE COMMENT Comment The neoplastic cells are positive for cytokeratin 7 and Pax-8 but negative for cytokeratin 20, TTF-1, CDX-2 and Gata-3. Overall, the phenotype is consistent with the clinical impression of gynecologic primary.    12/15/2018 Procedure   Placement of single lumen port a cath via right internal jugular vein. The catheter tip lies at the cavo-atrial junction. A power injectable port a cath was placed and is ready for immediate use   12/16/2018 -  Chemotherapy   The patient had carboplatin and taxol for chemotherapy treatment.     01/06/2019 Tumor Marker   Patient's tumor was tested for the following markers: CA-125 Results of the tumor marker test revealed 948   02/02/2019 Imaging   1. Massive pulmonary embolism, as discussed above. Given the mildly elevated RV to LV ratio of 0.95, this is associated with increased risk of morbidity and mortality. 2. Today's study demonstrates a mixed response to therapy. Specifically, while there has been regression of the bulky intraperitoneal metastatic disease and regression of previously noted pleural effusions, the large cystic mass in the central pelvis has increased in size compared to the prior study. 3. New right mild hydroureteronephrosis related to extrinsic compression on the distal third of the right ureter by the patient's large pelvic mass. 4. Scattered small pulmonary nodules (predominantly pleural based) appear stable compared to prior examinations. These are nonspecific but warrant continued attention on follow-up studies. 5. Aortic atherosclerosis, in addition to three-vessel coronary artery disease. Assessment for potential risk factor modification, dietary therapy or pharmacologic therapy may be warranted, if clinically indicated.   02/02/2019 - 02/04/2019 Hospital Admission   She was admitted the  hospital due to DVT and PE   02/03/2019 Imaging   Bilateral venous Doppler US Right: Findings consistent with acute deep vein thrombosis involving the right femoral vein, right popliteal vein, right peroneal veins, right soleal veins, and right gastrocnemius veins. No cystic structure found in the popliteal fossa. Left: There is no evidence of deep vein thrombosis in the lower extremity. No cystic structure found in the popliteal fossa   02/17/2019 Tumor Marker   Patient's tumor was tested for the following markers: CA-125 Results of the tumor marker test revealed 127   03/10/2019 Tumor Marker   Patient's tumor was tested for the following markers: CA-125. Results of the tumor marker test revealed 87.4   03/14/2019 - 03/16/2019 Hospital Admission   She was admitted to the hospital recently for weakness   03/31/2019 Tumor Marker   Patient's tumor was tested for the following markers: CA-125 Results of the tumor marker test revealed 59.2.   04/28/2019 Tumor Marker   Patient's tumor was tested for the following markers: CA-125 Results of the tumor marker test revealed 56.8   05/05/2019 Imaging   1. Interval decrease in size of the large cystic mass in the central pelvis. Mesenteric and omental soft tissue disease shows no substantial interval change. 2. The mild right hydroureteronephrosis seen previously has resolved in the interval. 3. Small residual  nonobstructive thrombus identified in the inter lobar pulmonary artery common here into the lateral wall compatible with chronicity. 4. 14 mm subtle enhancing lesion in the anterior right liver is stable. This may be vascular malformation. Attention on follow-up recommended. 5. Stable appearance of the multiple small bilateral pulmonary nodules. Continued attention on follow-up recommended. 6.  Aortic Atherosclerois (ICD10-170.0)     06/06/2019 Pathology Results   A. OMENTUM, RESECTION: - Metastatic carcinoma. B. RIGHT FALLOPIAN TUBE AND  OVARY, SALPINGOOOPHORECTOMY: - High-grade serous carcinoma, spanning 9 cm. - No surface involvement identified. - Fallopian tube involved by carcinoma. - See oncology table. C. PERITONEAL NODULE, EXCISION: - Metastatic carcinoma. ONCOLOGY TABLE: OVARY or FALLOPIAN TUBE or PRIMARY PERITONEUM: Procedure: Right salpingo-oophorectomy, omental resection, and peritoneal biopsy. Specimen Integrity: Intact Tumor Site: Right ovary Ovarian Surface Involvement (required only if applicable): Not identified Tumor Size: 9 cm Histologic Type: High-grade serous carcinoma Histologic Grade: High-grade Implants (required for advanced stage serous/seromucinous borderline tumors only): Omentum, peritoneum. Other Tissue/ Organ Involvement: Right fallopian tube Largest Extrapelvic Peritoneal Focus (required only if applicable): 7.3 cm Peritoneal/Ascitic Fluid: Positive pleural fluid pre neoadjuvant therapy. Treatment Effect (required only for high-grade serous carcinomas): Probable treatment effect present. Regional Lymph Nodes: No lymph nodes submitted or found Pathologic Stage Classification (pTNM, AJCC 8th Edition): ypT3c, ypNX Representative Tumor Block: B2   06/06/2019 Surgery   Preoperative Diagnosis: stage IV ovarian cancer, s/p neoadjuvant chemotherapy, history of recent PE.  Procedure(s) Performed: Exploratory laparotomy with right salpingo-oophorectomy, omentectomy radical tumor debulking for ovarian cancer .   Surgeon: Thereasa Solo, MD.    Operative Findings:  Omental cake adherent to anterior abdominal wall and hepatic flexure. 10cm right tube and ovary. Surgically absent uterus and left tube and ovary. Granular nodularity across right diaphragm.    This represented an optimal cytoreduction (R0) with no gross visible disease remaining.    07/06/2019 Tumor Marker   Patient's tumor was tested for the following markers:CA-125 Results of the tumor marker test revealed 16.4   08/10/2019 Genetic  Testing   Negative genetic testing. No pathogenic variants identified. VUS in BRCA2 called [id number] identified on the [facility] CancerNext+RNAinsight panel. The report date is 08/10/2019.  The CancerNext+RNAinsight gene panel offered by [facility] includes sequencing and rearrangement analysis for the following 36 genes: APC*, ATM*, AXIN2, BARD1, BMPR1A, BRCA1*, BRCA2*, BRIP1*, CDH1*, CDK4, CDKN2A, CHEK2*, DICER1, MLH1*, MSH2*, MSH3, MSH6*, MUTYH*, NBN, NF1*, NTHL1, PALB2*, PMS2*, PTEN*, RAD51C*, RAD51D*, RECQL, SMAD4, SMARCA4, STK11 and TP53* (sequencing and deletion/duplication); HOXB13, POLD1 and POLE (sequencing only); EPCAM and GREM1 (deletion/duplication only). DNA and RNA analyses performed for * genes.   HRD (somatic) testing was initially ordered and was not completed due to insufficient amount of tumor sample.    08/24/2019 Tumor Marker   Patient's tumor was tested for the following markers: CA-125 Results of the tumor marker test revealed 12.2     REVIEW OF SYSTEMS:   Constitutional: Denies fevers, chills or abnormal weight loss Eyes: Denies blurriness of vision Ears, nose, mouth, throat, and face: Denies mucositis or sore throat Respiratory: Denies cough, dyspnea or wheezes Cardiovascular: Denies palpitation, chest discomfort or lower extremity swelling Gastrointestinal:  Denies nausea, heartburn or change in bowel habits Skin: Denies abnormal skin rashes Lymphatics: Denies new lymphadenopathy or easy bruising Neurological:Denies numbness, tingling or new weaknesses Behavioral/Psych: Mood is stable, no new changes  All other systems were reviewed with the patient and are negative.  I have reviewed the past medical history, past surgical history, social history and family  history with the patient and they are unchanged from previous note.  ALLERGIES:  has No Known Allergies.  MEDICATIONS:  Current Outpatient Medications  Medication Sig Dispense Refill  . acetaminophen  (TYLENOL) 325 MG tablet Take 650 mg by mouth every 6 (six) hours as needed for moderate pain or headache.     Marland Kitchen ELIQUIS 5 MG TABS tablet TAKE ONE TABLET BY MOUTH TWICE DAILY 60 tablet 1  . esomeprazole (NEXIUM) 40 MG capsule Take 40 mg by mouth 2 (two) times daily.     Marland Kitchen levothyroxine (SYNTHROID) 50 MCG tablet TAKE ONE TABLET EVERY MORNING (Patient taking differently: Take 50 mcg by mouth daily before breakfast. ) 30 tablet 9  . lidocaine-prilocaine (EMLA) cream Apply to affected area once (Patient taking differently: Apply 1 application topically daily as needed (port access). ) 30 g 3  . losartan (COZAAR) 50 MG tablet Take 50 mg by mouth daily.    . Melatonin 3 MG TABS Take by mouth.    . ondansetron (ZOFRAN) 8 MG tablet Take 8 mg by mouth every 8 (eight) hours as needed.    . prochlorperazine (COMPAZINE) 10 MG tablet Take 10 mg by mouth every 6 (six) hours as needed.    . SENEXON-S 8.6-50 MG tablet TAKE 1 TO 2 TABLETS AT BEDTIME FOR AFTER SURGERY (DO NOT TAKE IF HAVING DIARRHEA) 30 tablet 0   No current facility-administered medications for this visit.    PHYSICAL EXAMINATION: ECOG PERFORMANCE STATUS: 1 - Symptomatic but completely ambulatory  Vitals:   08/24/19 1212  BP: (!) 118/58  Pulse: 88  Resp: 17  Temp: 98 F (36.7 C)  SpO2: 99%   Filed Weights   08/24/19 1212  Weight: 214 lb 3.2 oz (97.2 kg)    GENERAL:alert, no distress and comfortable SKIN: skin color, texture, turgor are normal, no rashes or significant lesions EYES: normal, Conjunctiva are pink and non-injected, sclera clear OROPHARYNX:no exudate, no erythema and lips, buccal mucosa, and tongue normal  NECK: supple, thyroid normal size, non-tender, without nodularity LYMPH:  no palpable lymphadenopathy in the cervical, axillary or inguinal LUNGS: clear to auscultation and percussion with normal breathing effort HEART: regular rate & rhythm and no murmurs and no lower extremity edema ABDOMEN:abdomen soft,  non-tender and normal bowel sounds Musculoskeletal:no cyanosis of digits and no clubbing  NEURO: alert & oriented x 3 with fluent speech, no focal motor/sensory deficits  LABORATORY DATA:  I have reviewed the data as listed    Component Value Date/Time   NA 140 08/24/2019 1128   K 4.1 08/24/2019 1128   CL 104 08/24/2019 1128   CO2 25 08/24/2019 1128   GLUCOSE 155 (H) 08/24/2019 1128   BUN 20 08/24/2019 1128   CREATININE 1.08 (H) 08/24/2019 1128   CALCIUM 9.8 08/24/2019 1128   PROT 7.2 08/24/2019 1128   ALBUMIN 3.7 08/24/2019 1128   AST 18 08/24/2019 1128   ALT 30 08/24/2019 1128   ALKPHOS 88 08/24/2019 1128   BILITOT 0.2 (L) 08/24/2019 1128   GFRNONAA 50 (L) 08/24/2019 1128   GFRAA 58 (L) 08/24/2019 1128    No results found for: SPEP, UPEP  Lab Results  Component Value Date   WBC 4.9 08/24/2019   NEUTROABS 3.7 08/24/2019   HGB 10.7 (L) 08/24/2019   HCT 31.3 (L) 08/24/2019   MCV 96.9 08/24/2019   PLT 166 08/24/2019      Chemistry      Component Value Date/Time   NA 140 08/24/2019 1128  K 4.1 08/24/2019 1128   CL 104 08/24/2019 1128   CO2 25 08/24/2019 1128   BUN 20 08/24/2019 1128   CREATININE 1.08 (H) 08/24/2019 1128      Component Value Date/Time   CALCIUM 9.8 08/24/2019 1128   ALKPHOS 88 08/24/2019 1128   AST 18 08/24/2019 1128   ALT 30 08/24/2019 1128   BILITOT 0.2 (L) 08/24/2019 1128       RADIOGRAPHIC STUDIES: I have personally reviewed the radiological images as listed and agreed with the findings in the report. No results found.

## 2019-08-25 NOTE — Assessment & Plan Note (Signed)
She tolerated recent chemotherapy well without side effects She will proceed with treatment as schedule I will see her again in 4 weeks for further follow-up I plan to repeat imaging study for objective assessment of response to therapy So far, her tumor marker has normalized and she has no new symptoms I am optimistic that she would have complete response when I see her back We discussed briefly about Covid vaccination and she should be able to receive her Covid vaccine after completion of treatment in about 3 weeks

## 2019-09-12 DIAGNOSIS — R300 Dysuria: Secondary | ICD-10-CM | POA: Diagnosis not present

## 2019-09-12 DIAGNOSIS — J0101 Acute recurrent maxillary sinusitis: Secondary | ICD-10-CM | POA: Diagnosis not present

## 2019-09-20 ENCOUNTER — Encounter (HOSPITAL_COMMUNITY): Payer: Self-pay

## 2019-09-20 ENCOUNTER — Other Ambulatory Visit: Payer: Self-pay

## 2019-09-20 ENCOUNTER — Inpatient Hospital Stay: Payer: Medicare Other | Attending: Hematology and Oncology

## 2019-09-20 ENCOUNTER — Inpatient Hospital Stay: Payer: Medicare Other

## 2019-09-20 ENCOUNTER — Ambulatory Visit (HOSPITAL_COMMUNITY)
Admission: RE | Admit: 2019-09-20 | Discharge: 2019-09-20 | Disposition: A | Discharge status: Home / Self Care | Payer: Medicare Other | Source: Ambulatory Visit | Attending: Hematology and Oncology | Admitting: Hematology and Oncology

## 2019-09-20 DIAGNOSIS — Z7901 Long term (current) use of anticoagulants: Secondary | ICD-10-CM | POA: Insufficient documentation

## 2019-09-20 DIAGNOSIS — Z9079 Acquired absence of other genital organ(s): Secondary | ICD-10-CM | POA: Diagnosis not present

## 2019-09-20 DIAGNOSIS — C561 Malignant neoplasm of right ovary: Secondary | ICD-10-CM | POA: Insufficient documentation

## 2019-09-20 DIAGNOSIS — Z90710 Acquired absence of both cervix and uterus: Secondary | ICD-10-CM | POA: Diagnosis not present

## 2019-09-20 DIAGNOSIS — G8929 Other chronic pain: Secondary | ICD-10-CM | POA: Insufficient documentation

## 2019-09-20 DIAGNOSIS — M25559 Pain in unspecified hip: Secondary | ICD-10-CM | POA: Diagnosis not present

## 2019-09-20 DIAGNOSIS — N281 Cyst of kidney, acquired: Secondary | ICD-10-CM | POA: Diagnosis not present

## 2019-09-20 DIAGNOSIS — J984 Other disorders of lung: Secondary | ICD-10-CM | POA: Diagnosis not present

## 2019-09-20 DIAGNOSIS — Z9221 Personal history of antineoplastic chemotherapy: Secondary | ICD-10-CM | POA: Insufficient documentation

## 2019-09-20 DIAGNOSIS — Z86711 Personal history of pulmonary embolism: Secondary | ICD-10-CM | POA: Diagnosis not present

## 2019-09-20 DIAGNOSIS — Z8543 Personal history of malignant neoplasm of ovary: Secondary | ICD-10-CM | POA: Insufficient documentation

## 2019-09-20 DIAGNOSIS — G620 Drug-induced polyneuropathy: Secondary | ICD-10-CM | POA: Diagnosis not present

## 2019-09-20 DIAGNOSIS — Z79899 Other long term (current) drug therapy: Secondary | ICD-10-CM | POA: Diagnosis not present

## 2019-09-20 DIAGNOSIS — Z90722 Acquired absence of ovaries, bilateral: Secondary | ICD-10-CM | POA: Insufficient documentation

## 2019-09-20 DIAGNOSIS — D61818 Other pancytopenia: Secondary | ICD-10-CM | POA: Insufficient documentation

## 2019-09-20 DIAGNOSIS — C569 Malignant neoplasm of unspecified ovary: Secondary | ICD-10-CM

## 2019-09-20 DIAGNOSIS — Z452 Encounter for adjustment and management of vascular access device: Secondary | ICD-10-CM | POA: Insufficient documentation

## 2019-09-20 DIAGNOSIS — J8410 Pulmonary fibrosis, unspecified: Secondary | ICD-10-CM | POA: Diagnosis not present

## 2019-09-20 LAB — CMP (CANCER CENTER ONLY)
ALT: 50 U/L — ABNORMAL HIGH (ref 0–44)
AST: 28 U/L (ref 15–41)
Albumin: 3.6 g/dL (ref 3.5–5.0)
Alkaline Phosphatase: 98 U/L (ref 38–126)
Anion gap: 11 (ref 5–15)
BUN: 14 mg/dL (ref 8–23)
CO2: 25 mmol/L (ref 22–32)
Calcium: 10.2 mg/dL (ref 8.9–10.3)
Chloride: 102 mmol/L (ref 98–111)
Creatinine: 0.84 mg/dL (ref 0.44–1.00)
GFR, Est AFR Am: >60 mL/min (ref >60)
GFR, Estimated: >60 mL/min (ref >60)
Glucose, Bld: 103 mg/dL — ABNORMAL HIGH (ref 70–99)
Potassium: 4.1 mmol/L (ref 3.5–5.1)
Sodium: 138 mmol/L (ref 135–145)
Total Bilirubin: 0.3 mg/dL (ref 0.3–1.2)
Total Protein: 7.2 g/dL (ref 6.5–8.1)

## 2019-09-20 LAB — CBC WITH DIFFERENTIAL (CANCER CENTER ONLY)
Abs Immature Granulocytes: 0.01 10*3/uL (ref 0.00–0.07)
Basophils Absolute: 0 10*3/uL (ref 0.0–0.1)
Basophils Relative: 0 %
Eosinophils Absolute: 0 10*3/uL (ref 0.0–0.5)
Eosinophils Relative: 0 %
HCT: 30.5 % — ABNORMAL LOW (ref 36.0–46.0)
Hemoglobin: 10.1 g/dL — ABNORMAL LOW (ref 12.0–15.0)
Immature Granulocytes: 0 %
Lymphocytes Relative: 45 %
Lymphs Abs: 2 10*3/uL (ref 0.7–4.0)
MCH: 32.7 pg (ref 26.0–34.0)
MCHC: 33.1 g/dL (ref 30.0–36.0)
MCV: 98.7 fL (ref 80.0–100.0)
Monocytes Absolute: 0.5 10*3/uL (ref 0.1–1.0)
Monocytes Relative: 11 %
Neutro Abs: 2 10*3/uL (ref 1.7–7.7)
Neutrophils Relative %: 44 %
Platelet Count: 142 10*3/uL — ABNORMAL LOW (ref 150–400)
RBC: 3.09 MIL/uL — ABNORMAL LOW (ref 3.87–5.11)
RDW: 17.3 % — ABNORMAL HIGH (ref 11.5–15.5)
WBC Count: 4.5 10*3/uL (ref 4.0–10.5)
nRBC: 0 % (ref 0.0–0.2)

## 2019-09-20 MED ORDER — IOHEXOL 300 MG/ML  SOLN
100.0000 mL | Freq: Once | INTRAMUSCULAR | Status: AC | PRN
  Administered 2019-09-20: 100 mL via INTRAVENOUS

## 2019-09-20 MED ORDER — HEPARIN SOD (PORK) LOCK FLUSH 100 UNIT/ML IV SOLN
INTRAVENOUS | Status: AC
  Filled 2019-09-20: qty 5

## 2019-09-20 MED ORDER — HEPARIN SOD (PORK) LOCK FLUSH 100 UNIT/ML IV SOLN
500.0000 [IU] | Freq: Once | INTRAVENOUS | Status: AC
  Administered 2019-09-20: 500 [IU] via INTRAVENOUS

## 2019-09-20 MED ORDER — SODIUM CHLORIDE (PF) 0.9 % IJ SOLN
INTRAMUSCULAR | Status: AC
  Filled 2019-09-20: qty 50

## 2019-09-21 ENCOUNTER — Telehealth: Payer: Self-pay | Admitting: Hematology and Oncology

## 2019-09-21 ENCOUNTER — Inpatient Hospital Stay: Payer: Medicare Other | Admitting: Hematology and Oncology

## 2019-09-21 ENCOUNTER — Other Ambulatory Visit: Payer: Self-pay

## 2019-09-21 ENCOUNTER — Ambulatory Visit: Payer: Medicare Other | Attending: Internal Medicine

## 2019-09-21 ENCOUNTER — Other Ambulatory Visit: Payer: Self-pay | Admitting: Hematology and Oncology

## 2019-09-21 ENCOUNTER — Encounter: Payer: Self-pay | Admitting: Hematology and Oncology

## 2019-09-21 DIAGNOSIS — Z79899 Other long term (current) drug therapy: Secondary | ICD-10-CM | POA: Diagnosis not present

## 2019-09-21 DIAGNOSIS — C561 Malignant neoplasm of right ovary: Secondary | ICD-10-CM | POA: Diagnosis not present

## 2019-09-21 DIAGNOSIS — Z9221 Personal history of antineoplastic chemotherapy: Secondary | ICD-10-CM | POA: Diagnosis not present

## 2019-09-21 DIAGNOSIS — G620 Drug-induced polyneuropathy: Secondary | ICD-10-CM | POA: Diagnosis not present

## 2019-09-21 DIAGNOSIS — D61818 Other pancytopenia: Secondary | ICD-10-CM

## 2019-09-21 DIAGNOSIS — Z86711 Personal history of pulmonary embolism: Secondary | ICD-10-CM | POA: Diagnosis not present

## 2019-09-21 DIAGNOSIS — Z7901 Long term (current) use of anticoagulants: Secondary | ICD-10-CM | POA: Diagnosis not present

## 2019-09-21 DIAGNOSIS — G8929 Other chronic pain: Secondary | ICD-10-CM | POA: Diagnosis not present

## 2019-09-21 DIAGNOSIS — M25559 Pain in unspecified hip: Secondary | ICD-10-CM | POA: Diagnosis not present

## 2019-09-21 DIAGNOSIS — Z23 Encounter for immunization: Secondary | ICD-10-CM

## 2019-09-21 DIAGNOSIS — I2699 Other pulmonary embolism without acute cor pulmonale: Secondary | ICD-10-CM

## 2019-09-21 DIAGNOSIS — T451X5A Adverse effect of antineoplastic and immunosuppressive drugs, initial encounter: Secondary | ICD-10-CM

## 2019-09-21 DIAGNOSIS — Z452 Encounter for adjustment and management of vascular access device: Secondary | ICD-10-CM | POA: Diagnosis not present

## 2019-09-21 LAB — CA 125: Cancer Antigen (CA) 125: 11.9 U/mL (ref 0.0–38.1)

## 2019-09-21 NOTE — Progress Notes (Signed)
   Covid-19 Vaccination Clinic  Name:  Paula Peterson    MRN: [id number] DOB: Mar 27, 1941  09/21/2019  [name] was observed post Covid-19 immunization for 15 minutes without incident. She was provided with Vaccine Information Sheet and instruction to access the V-Safe system.   [name] was instructed to call [phone] with any severe reactions post vaccine: Marland Kitchen Difficulty breathing  . Swelling of face and throat  . A fast heartbeat  . A bad rash all over body  . Dizziness and weakness   Immunizations Administered    Name Date Dose VIS Date Route   Moderna COVID-19 Vaccine 09/21/2019 12:01 PM 0.5 mL 05/30/2019 Intramuscular   Manufacturer: Moderna   Lot: [id number]   HousePO:[id number]

## 2019-09-21 NOTE — Assessment & Plan Note (Signed)
Her CT scan showed no evidence of disease There is a small omental nodule that is stable Her tumor marker is normal I recommend close follow-up I plan to repeat CT imaging again in 6 months along with tumor marker monitoring I will get her follow-up with GYN surgeon in about 3 months I recommend port maintenance for at least 2 years before consideration to remove her port

## 2019-09-21 NOTE — Progress Notes (Signed)
[place] OFFICE PROGRESS NOTE  Patient Care Team: Caryl Bis, MD as PCP - General (Family Medicine) Awanda Mink Craige Cotta, RN as Oncology Nurse Navigator (Oncology)  ASSESSMENT & PLAN:  Right ovarian epithelial cancer [facility]) Her CT scan showed no evidence of disease There is a small omental nodule that is stable Her tumor marker is normal I recommend close follow-up I plan to repeat CT imaging again in 6 months along with tumor marker monitoring I will get her follow-up with GYN surgeon in about 3 months I recommend port maintenance for at least 2 years before consideration to remove her port  PE (pulmonary thromboembolism) ([place]) She has history of severe life-threatening PE diagnosed in August I recommend she continues anticoagulation therapy at least until her next CT imaging in 6 months So far, she is doing well without bleeding complications  Pancytopenia, acquired ([place]) She has mild persistent pancytopenia but much improved/stable compared to before We will observe closely and repeat blood work in a few months  Chronic hip pain She has poor mobility due to severe hip pain and desired hip surgery I recommend her orthopedic surgeon to contact me regarding perioperative anticoagulation therapy She does not need bridging anticoagulation treatment but remain high risk for recurrent DVT/PE  Peripheral neuropathy due to chemotherapy [facility]) She has mild residual peripheral neuropathy from treatment I anticipate it will improve in time   Orders Placed This Encounter  Procedures  . CT ABDOMEN PELVIS W CONTRAST    Standing Status:   Future    Standing Expiration Date:   09/20/2020    Order Specific Question:   If indicated for the ordered procedure, I authorize the administration of contrast media per Radiology protocol    Answer:   Yes    Order Specific Question:   Preferred imaging location?    Answer:   [facility]    Order Specific Question:   Radiology  Contrast Protocol - do NOT remove file path    Answer:   [email or web address]    Order Specific Question:   ** REASON FOR EXAM (FREE TEXT)    Answer:   peritoneal nodule, Hx ovarian ca    All questions were answered. The patient knows to call the clinic with any problems, questions or concerns. The total time spent in the appointment was 30 minutes encounter with patients including review of chart and various tests results, discussions about plan of care and coordination of care plan   [name], MD 09/21/2019 12:35 PM  INTERVAL HISTORY: Please see below for problem oriented charting. 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  She has bloating and early satiety. Shortness of breath with walking. She denied bleeding. She reported constipation with pain with defecation and narrowed stools   11/29/2018 Imaging   1. 13 cm complex cystic lesion in the central pelvis, highly suspicious for ovarian cystadenocarcinoma. 2. Diffuse peritoneal carcinomatosis with mild ascites. 3. Mild lymphadenopathy in porta hepatis and right cardiophrenic angle, suspicious for metastatic disease. 4. Moderate right and tiny left  pleural effusions   12/05/2018 Tumor Marker   Patient's tumor was tested for the following markers: CA-125 Results of the tumor marker test revealed 1015.   12/06/2018 Cancer Staging   Staging form: Ovary, Fallopian Tube, and Primary Peritoneal Carcinoma, AJCC 8th Edition -  Clinical: FIGO Stage IVA, calculated as Stage IV (cT3c, cN1, cM1) - Signed by [name], MD on 12/06/2018   12/09/2018 Pathology Results   PLEURAL FLUID, RIGHT (SPECIMEN 1 OF 1 COLLECTED 12/09/18): - MALIGNANT CELLS CONSISTENT WITH METASTATIC ADENOCARCINOMA - SEE COMMENT Comment The neoplastic cells are positive for cytokeratin 7 and Pax-8 but negative for cytokeratin 20, TTF-1, CDX-2 and Gata-3. Overall, the phenotype is consistent with the clinical impression of gynecologic primary.    12/15/2018 Procedure   Placement of single lumen port a cath via right internal jugular vein. The catheter tip lies at the cavo-atrial junction. A power injectable port a cath was placed and is ready for immediate use   12/16/2018 - 08/24/2019 Chemotherapy   The patient had carboplatin and taxol for chemotherapy treatment.  She had many cycles given neoadjuvant prior to surgery and a few more cycles after surgery, for a total of 11 cycles of treatment    01/06/2019 Tumor Marker   Patient's tumor was tested for the following markers: CA-125 Results of the tumor marker test revealed 948   02/02/2019 Imaging   1. Massive pulmonary embolism, as discussed above. Given the mildly elevated RV to LV ratio of 0.95, this is associated with increased risk of morbidity and mortality. 2. Today's study demonstrates a mixed response to therapy. Specifically, while there has been regression of the bulky intraperitoneal metastatic disease and regression of previously noted pleural effusions, the large cystic mass in the central pelvis has increased in size compared to the prior study. 3. New right mild hydroureteronephrosis related to extrinsic compression on the distal third of the right ureter by the patient's large pelvic mass. 4. Scattered small pulmonary nodules (predominantly pleural based) appear stable compared to prior examinations. These are nonspecific but warrant continued attention on follow-up studies. 5. Aortic  atherosclerosis, in addition to three-vessel coronary artery disease. Assessment for potential risk factor modification, dietary therapy or pharmacologic therapy may be warranted, if clinically indicated.   02/02/2019 - 02/04/2019 Hospital Admission   She was admitted the hospital due to DVT and PE   02/03/2019 Imaging   Bilateral venous Doppler US Right: Findings consistent with acute deep vein thrombosis involving the right femoral vein, right popliteal vein, right peroneal veins, right soleal veins, and right gastrocnemius veins. No cystic structure found in the popliteal fossa. Left: There is no evidence of deep vein thrombosis in the lower extremity. No cystic structure found in the popliteal fossa   02/17/2019 Tumor Marker   Patient's tumor was tested for the following markers: CA-125 Results of the tumor marker test revealed 127   03/10/2019 Tumor Marker   Patient's tumor was tested for the following markers: CA-125. Results of the tumor marker test revealed 87.4   03/14/2019 - 03/16/2019 Hospital Admission   She was admitted to the hospital recently for weakness   03/31/2019 Tumor Marker   Patient's tumor was tested for the following markers: CA-125 Results of the tumor marker test revealed 59.2.   04/28/2019 Tumor Marker   Patient's tumor was tested for the following markers: CA-125 Results of the tumor marker test revealed 56.8   05/05/2019 Imaging   1. Interval decrease in size of the large cystic mass in  the central pelvis. Mesenteric and omental soft tissue disease shows no substantial interval change. 2. The mild right hydroureteronephrosis seen previously has resolved in the interval. 3. Small residual nonobstructive thrombus identified in the inter lobar pulmonary artery common here into the lateral wall compatible with chronicity. 4. 14 mm subtle enhancing lesion in the anterior right liver is stable. This may be vascular malformation. Attention on follow-up recommended. 5.  Stable appearance of the multiple small bilateral pulmonary nodules. Continued attention on follow-up recommended. 6.  Aortic Atherosclerois (ICD10-170.0)     06/06/2019 Pathology Results   A. OMENTUM, RESECTION: - Metastatic carcinoma. B. RIGHT FALLOPIAN TUBE AND OVARY, SALPINGOOOPHORECTOMY: - High-grade serous carcinoma, spanning 9 cm. - No surface involvement identified. - Fallopian tube involved by carcinoma. - See oncology table. C. PERITONEAL NODULE, EXCISION: - Metastatic carcinoma. ONCOLOGY TABLE: OVARY or FALLOPIAN TUBE or PRIMARY PERITONEUM: Procedure: Right salpingo-oophorectomy, omental resection, and peritoneal biopsy. Specimen Integrity: Intact Tumor Site: Right ovary Ovarian Surface Involvement (required only if applicable): Not identified Tumor Size: 9 cm Histologic Type: High-grade serous carcinoma Histologic Grade: High-grade Implants (required for advanced stage serous/seromucinous borderline tumors only): Omentum, peritoneum. Other Tissue/ Organ Involvement: Right fallopian tube Largest Extrapelvic Peritoneal Focus (required only if applicable): 7.3 cm Peritoneal/Ascitic Fluid: Positive pleural fluid pre neoadjuvant therapy. Treatment Effect (required only for high-grade serous carcinomas): Probable treatment effect present. Regional Lymph Nodes: No lymph nodes submitted or found Pathologic Stage Classification (pTNM, AJCC 8th Edition): ypT3c, ypNX Representative Tumor Block: B2   06/06/2019 Surgery   Preoperative Diagnosis: stage IV ovarian cancer, s/p neoadjuvant chemotherapy, history of recent PE.  Procedure(s) Performed: Exploratory laparotomy with right salpingo-oophorectomy, omentectomy radical tumor debulking for ovarian cancer .   Surgeon: Thereasa Solo, MD.    Operative Findings:  Omental cake adherent to anterior abdominal wall and hepatic flexure. 10cm right tube and ovary. Surgically absent uterus and left tube and ovary. Granular nodularity across  right diaphragm.    This represented an optimal cytoreduction (R0) with no gross visible disease remaining.    07/06/2019 Tumor Marker   Patient's tumor was tested for the following markers:CA-125 Results of the tumor marker test revealed 16.4   08/10/2019 Genetic Testing   Negative genetic testing. No pathogenic variants identified. VUS in BRCA2 called [id number] identified on the [facility] CancerNext+RNAinsight panel. The report date is 08/10/2019.  The CancerNext+RNAinsight gene panel offered by [facility] includes sequencing and rearrangement analysis for the following 36 genes: APC*, ATM*, AXIN2, BARD1, BMPR1A, BRCA1*, BRCA2*, BRIP1*, CDH1*, CDK4, CDKN2A, CHEK2*, DICER1, MLH1*, MSH2*, MSH3, MSH6*, MUTYH*, NBN, NF1*, NTHL1, PALB2*, PMS2*, PTEN*, RAD51C*, RAD51D*, RECQL, SMAD4, SMARCA4, STK11 and TP53* (sequencing and deletion/duplication); HOXB13, POLD1 and POLE (sequencing only); EPCAM and GREM1 (deletion/duplication only). DNA and RNA analyses performed for * genes.   HRD (somatic) testing was initially ordered and was not completed due to insufficient amount of tumor sample.    08/24/2019 Tumor Marker   Patient's tumor was tested for the following markers: CA-125 Results of the tumor marker test revealed 12.2   09/20/2019 Imaging   1. Interval debulking, bilateral salpingo oophorectomy and omentectomy with resection of the dominant pelvic cystic lesion and the bandlike soft tissue seen previously in the right mesentery/omentum. Today's study is status shows new postoperative baseline for follow-up. 2. 6 mm gastrohepatic ligament nodule seen on the previous study is stable. Continued attention on follow-up recommended. 3. No new or progressive findings in the chest, abdomen, or pelvis to suggest disease progression. 4. Stable 1.4 cm  focus of homogeneous enhancement in the anterior right liver. Stable since at least 11/29/2018 and likely benign. Continued attention on follow-up recommended. 5.  Aortic Atherosclerosis (ICD10-I70.0).   09/20/2019 Tumor Marker   Patient's tumor was tested for the following markers: CA-125 Results of the tumor marker test revealed 11.9     REVIEW OF SYSTEMS:   Constitutional: Denies fevers, chills or abnormal weight loss Eyes: Denies blurriness of vision Ears, nose, mouth, throat, and face: Denies mucositis or sore throat Respiratory: Denies cough, dyspnea or wheezes Cardiovascular: Denies palpitation, chest discomfort or lower extremity swelling Gastrointestinal:  Denies nausea, heartburn or change in bowel habits Skin: Denies abnormal skin rashes Lymphatics: Denies new lymphadenopathy or easy bruising Behavioral/Psych: Mood is stable, no new changes  All other systems were reviewed with the patient and are negative.  I have reviewed the past medical history, past surgical history, social history and family history with the patient and they are unchanged from previous note.  ALLERGIES:  has No Known Allergies.  MEDICATIONS:  Current Outpatient Medications  Medication Sig Dispense Refill  . acetaminophen (TYLENOL) 325 MG tablet Take 650 mg by mouth every 6 (six) hours as needed for moderate pain or headache.     Marland Kitchen ELIQUIS 5 MG TABS tablet TAKE ONE TABLET BY MOUTH TWICE DAILY 60 tablet 1  . esomeprazole (NEXIUM) 40 MG capsule Take 40 mg by mouth 2 (two) times daily.     Marland Kitchen levothyroxine (SYNTHROID) 50 MCG tablet TAKE ONE TABLET EVERY MORNING (Patient taking differently: Take 50 mcg by mouth daily before breakfast. ) 30 tablet 9  . lidocaine-prilocaine (EMLA) cream Apply to affected area once (Patient taking differently: Apply 1 application topically daily as needed (port access). ) 30 g 3  . losartan (COZAAR) 50 MG tablet Take 50 mg by mouth daily.    . Melatonin 3 MG TABS Take by mouth.    . ondansetron (ZOFRAN) 8 MG tablet Take 8 mg by mouth every 8 (eight) hours as needed.    . prochlorperazine (COMPAZINE) 10 MG tablet Take 10 mg by mouth  every 6 (six) hours as needed.    . SENEXON-S 8.6-50 MG tablet TAKE 1 TO 2 TABLETS AT BEDTIME FOR AFTER SURGERY (DO NOT TAKE IF HAVING DIARRHEA) 30 tablet 0   No current facility-administered medications for this visit.    PHYSICAL EXAMINATION: ECOG PERFORMANCE STATUS: 2 - Symptomatic, <50% confined to bed  Vitals:   09/21/19 1049  BP: (!) 122/52  Pulse: 86  Resp: 18  Temp: 98.7 F (37.1 C)  SpO2: 99%   Filed Weights   09/21/19 1049  Weight: 214 lb (97.1 kg)    GENERAL:alert, no distress and comfortable NEURO: alert & oriented x 3 with fluent speech, no focal motor/sensory deficits  LABORATORY DATA:  I have reviewed the data as listed    Component Value Date/Time   NA 138 09/20/2019 0745   K 4.1 09/20/2019 0745   CL 102 09/20/2019 0745   CO2 25 09/20/2019 0745   GLUCOSE 103 (H) 09/20/2019 0745   BUN 14 09/20/2019 0745   CREATININE 0.84 09/20/2019 0745   CALCIUM 10.2 09/20/2019 0745   PROT 7.2 09/20/2019 0745   ALBUMIN 3.6 09/20/2019 0745   AST 28 09/20/2019 0745   ALT 50 (H) 09/20/2019 0745   ALKPHOS 98 09/20/2019 0745   BILITOT 0.3 09/20/2019 0745   GFRNONAA >60 09/20/2019 0745   GFRAA >60 09/20/2019 0745    No results found for: SPEP, UPEP  Lab Results  Component Value Date   WBC 4.5 09/20/2019   NEUTROABS 2.0 09/20/2019   HGB 10.1 (L) 09/20/2019   HCT 30.5 (L) 09/20/2019   MCV 98.7 09/20/2019   PLT 142 (L) 09/20/2019      Chemistry      Component Value Date/Time   NA 138 09/20/2019 0745   K 4.1 09/20/2019 0745   CL 102 09/20/2019 0745   CO2 25 09/20/2019 0745   BUN 14 09/20/2019 0745   CREATININE 0.84 09/20/2019 0745      Component Value Date/Time   CALCIUM 10.2 09/20/2019 0745   ALKPHOS 98 09/20/2019 0745   AST 28 09/20/2019 0745   ALT 50 (H) 09/20/2019 0745   BILITOT 0.3 09/20/2019 0745       RADIOGRAPHIC STUDIES: I have reviewed multiple imaging studies with the patient I have personally reviewed the radiological images as listed  and agreed with the findings in the report. CT CHEST W CONTRAST  Result Date: 09/20/2019 CLINICAL DATA:  Ovarian cancer. Restaging. Status post surgical debulking with bilateral salpingo oophorectomy and omentectomy on 06/06/2019. EXAM: CT CHEST, ABDOMEN, AND PELVIS WITH CONTRAST TECHNIQUE: Multidetector CT imaging of the chest, abdomen and pelvis was performed following the standard protocol during bolus administration of intravenous contrast. CONTRAST:  125m OMNIPAQUE IOHEXOL 300 MG/ML  SOLN COMPARISON:  05/05/2019 FINDINGS: CT CHEST FINDINGS Cardiovascular: The heart size is normal. No substantial pericardial effusion. Coronary artery calcification is evident. Atherosclerotic calcification is noted in the wall of the thoracic aorta. Right Port-A-Cath tip is positioned in the upper right atrium. Mediastinum/Nodes: No mediastinal lymphadenopathy. There is no hilar lymphadenopathy. The esophagus has normal imaging features. There is no axillary lymphadenopathy. Lungs/Pleura: Biapical pleuroparenchymal scarring again noted. Calcified granuloma posterior right upper lobe is stable. No new suspicious pulmonary nodule or mass. No focal airspace consolidation. No pleural effusion. Musculoskeletal: No worrisome lytic or sclerotic osseous abnormality. CT ABDOMEN PELVIS FINDINGS Hepatobiliary: No change in the 1.4 cm focus of homogeneous enhancement noted in the anterior right liver (62/2). Tiny hypodensity in the hepatic dome is stable. Gallbladder is distended but otherwise unremarkable. No intrahepatic or extrahepatic biliary dilation. Pancreas: No focal mass lesion. No dilatation of the main duct. No intraparenchymal cyst. No peripancreatic edema. Spleen: No splenomegaly. No focal mass lesion. Adrenals/Urinary Tract: No adrenal nodule or mass. Right kidney unremarkable. Central sinus cysts noted in the left kidney. No evidence for hydroureter. The urinary bladder appears normal for the degree of distention.  Stomach/Bowel: Stomach is unremarkable. No gastric wall thickening. No evidence of outlet obstruction. Duodenum is normally positioned as is the ligament of Treitz. No small bowel wall thickening. No small bowel dilatation. The terminal ileum is normal. The appendix is normal. No gross colonic mass. No colonic wall thickening. Vascular/Lymphatic: There is abdominal aortic atherosclerosis without aneurysm. There is no gastrohepatic or hepatoduodenal ligament lymphadenopathy. No retroperitoneal or mesenteric lymphadenopathy. No pelvic sidewall lymphadenopathy. Reproductive: Uterus surgically absent. Pessary or hormone ring identified in the vagina. There is no adnexal mass. Other: No intraperitoneal free fluid. The large cystic mass identified in the pelvis on the previous study is no longer evident compatible with interval resection. The dominant bandlike soft tissue seen previously in the right mesentery/omentum is no longer evident. The omental soft tissue identified on the previous study has been resected in the interval. Gastrocolic ligament nodule seen on the previous study likely demonstrated on image 80 of series 2 today measuring 6 mm, stable. Musculoskeletal: No worrisome lytic or sclerotic osseous  abnormality. IMPRESSION: 1. Interval debulking, bilateral salpingo oophorectomy and omentectomy with resection of the dominant pelvic cystic lesion and the bandlike soft tissue seen previously in the right mesentery/omentum. Today's study is status shows new postoperative baseline for follow-up. 2. 6 mm gastrohepatic ligament nodule seen on the previous study is stable. Continued attention on follow-up recommended. 3. No new or progressive findings in the chest, abdomen, or pelvis to suggest disease progression. 4. Stable 1.4 cm focus of homogeneous enhancement in the anterior right liver. Stable since at least 11/29/2018 and likely benign. Continued attention on follow-up recommended. 5. Aortic Atherosclerosis  (ICD10-I70.0). Electronically Signed   By: Misty Stanley M.D.   On: 09/20/2019 12:33   CT ABDOMEN PELVIS W CONTRAST  Result Date: 09/20/2019 CLINICAL DATA:  Ovarian cancer. Restaging. Status post surgical debulking with bilateral salpingo oophorectomy and omentectomy on 06/06/2019. EXAM: CT CHEST, ABDOMEN, AND PELVIS WITH CONTRAST TECHNIQUE: Multidetector CT imaging of the chest, abdomen and pelvis was performed following the standard protocol during bolus administration of intravenous contrast. CONTRAST:  161m OMNIPAQUE IOHEXOL 300 MG/ML  SOLN COMPARISON:  05/05/2019 FINDINGS: CT CHEST FINDINGS Cardiovascular: The heart size is normal. No substantial pericardial effusion. Coronary artery calcification is evident. Atherosclerotic calcification is noted in the wall of the thoracic aorta. Right Port-A-Cath tip is positioned in the upper right atrium. Mediastinum/Nodes: No mediastinal lymphadenopathy. There is no hilar lymphadenopathy. The esophagus has normal imaging features. There is no axillary lymphadenopathy. Lungs/Pleura: Biapical pleuroparenchymal scarring again noted. Calcified granuloma posterior right upper lobe is stable. No new suspicious pulmonary nodule or mass. No focal airspace consolidation. No pleural effusion. Musculoskeletal: No worrisome lytic or sclerotic osseous abnormality. CT ABDOMEN PELVIS FINDINGS Hepatobiliary: No change in the 1.4 cm focus of homogeneous enhancement noted in the anterior right liver (62/2). Tiny hypodensity in the hepatic dome is stable. Gallbladder is distended but otherwise unremarkable. No intrahepatic or extrahepatic biliary dilation. Pancreas: No focal mass lesion. No dilatation of the main duct. No intraparenchymal cyst. No peripancreatic edema. Spleen: No splenomegaly. No focal mass lesion. Adrenals/Urinary Tract: No adrenal nodule or mass. Right kidney unremarkable. Central sinus cysts noted in the left kidney. No evidence for hydroureter. The urinary bladder  appears normal for the degree of distention. Stomach/Bowel: Stomach is unremarkable. No gastric wall thickening. No evidence of outlet obstruction. Duodenum is normally positioned as is the ligament of Treitz. No small bowel wall thickening. No small bowel dilatation. The terminal ileum is normal. The appendix is normal. No gross colonic mass. No colonic wall thickening. Vascular/Lymphatic: There is abdominal aortic atherosclerosis without aneurysm. There is no gastrohepatic or hepatoduodenal ligament lymphadenopathy. No retroperitoneal or mesenteric lymphadenopathy. No pelvic sidewall lymphadenopathy. Reproductive: Uterus surgically absent. Pessary or hormone ring identified in the vagina. There is no adnexal mass. Other: No intraperitoneal free fluid. The large cystic mass identified in the pelvis on the previous study is no longer evident compatible with interval resection. The dominant bandlike soft tissue seen previously in the right mesentery/omentum is no longer evident. The omental soft tissue identified on the previous study has been resected in the interval. Gastrocolic ligament nodule seen on the previous study likely demonstrated on image 80 of series 2 today measuring 6 mm, stable. Musculoskeletal: No worrisome lytic or sclerotic osseous abnormality. IMPRESSION: 1. Interval debulking, bilateral salpingo oophorectomy and omentectomy with resection of the dominant pelvic cystic lesion and the bandlike soft tissue seen previously in the right mesentery/omentum. Today's study is status shows new postoperative baseline for follow-up. 2. 6 mm  gastrohepatic ligament nodule seen on the previous study is stable. Continued attention on follow-up recommended. 3. No new or progressive findings in the chest, abdomen, or pelvis to suggest disease progression. 4. Stable 1.4 cm focus of homogeneous enhancement in the anterior right liver. Stable since at least 11/29/2018 and likely benign. Continued attention on  follow-up recommended. 5. Aortic Atherosclerosis (ICD10-I70.0). Electronically Signed   By: Misty Stanley M.D.   On: 09/20/2019 12:33

## 2019-09-21 NOTE — Assessment & Plan Note (Signed)
She has mild persistent pancytopenia but much improved/stable compared to before We will observe closely and repeat blood work in a few months

## 2019-09-21 NOTE — Telephone Encounter (Signed)
Scheduled per 3/25 sch msg. Called pt and was unable to leave a msg. Mailing printout

## 2019-09-21 NOTE — Assessment & Plan Note (Signed)
She has history of severe life-threatening PE diagnosed in August I recommend she continues anticoagulation therapy at least until her next CT imaging in 6 months So far, she is doing well without bleeding complications

## 2019-09-21 NOTE — Assessment & Plan Note (Signed)
She has mild residual peripheral neuropathy from treatment I anticipate it will improve in time

## 2019-09-21 NOTE — Assessment & Plan Note (Signed)
She has poor mobility due to severe hip pain and desired hip surgery I recommend her orthopedic surgeon to contact me regarding perioperative anticoagulation therapy She does not need bridging anticoagulation treatment but remain high risk for recurrent DVT/PE

## 2019-09-22 ENCOUNTER — Telehealth: Payer: Self-pay | Admitting: *Deleted

## 2019-09-22 NOTE — Telephone Encounter (Signed)
Called the patient per Dr Alvy Bimler and scheduled a follow up appt for June

## 2019-10-10 DIAGNOSIS — M48062 Spinal stenosis, lumbar region with neurogenic claudication: Secondary | ICD-10-CM | POA: Diagnosis not present

## 2019-10-10 DIAGNOSIS — M5136 Other intervertebral disc degeneration, lumbar region: Secondary | ICD-10-CM | POA: Diagnosis not present

## 2019-10-10 DIAGNOSIS — M961 Postlaminectomy syndrome, not elsewhere classified: Secondary | ICD-10-CM | POA: Diagnosis not present

## 2019-10-12 DIAGNOSIS — M5136 Other intervertebral disc degeneration, lumbar region: Secondary | ICD-10-CM | POA: Diagnosis not present

## 2019-10-16 ENCOUNTER — Other Ambulatory Visit: Payer: Self-pay | Admitting: Hematology and Oncology

## 2019-10-16 DIAGNOSIS — I2699 Other pulmonary embolism without acute cor pulmonale: Secondary | ICD-10-CM

## 2019-10-19 ENCOUNTER — Ambulatory Visit: Payer: Medicare Other | Attending: Internal Medicine

## 2019-10-19 DIAGNOSIS — Z23 Encounter for immunization: Secondary | ICD-10-CM

## 2019-10-19 NOTE — Progress Notes (Signed)
   Covid-19 Vaccination Clinic  Name:  Paula Peterson    MRN: [id number] DOB: 1941-05-09  10/19/2019  [name] was observed post Covid-19 immunization for 15 minutes without incident. She was provided with Vaccine Information Sheet and instruction to access the V-Safe system.   [name] was instructed to call 911 with any severe reactions post vaccine: Marland Kitchen Difficulty breathing  . Swelling of face and throat  . A fast heartbeat  . A bad rash all over body  . Dizziness and weakness   Immunizations Administered    Name Date Dose VIS Date Route   Moderna COVID-19 Vaccine 10/19/2019 10:21 AM 0.5 mL 05/2019 Intramuscular   Manufacturer: Levan Hurst   Lot: [id number]   [id number]

## 2019-10-21 DIAGNOSIS — N390 Urinary tract infection, site not specified: Secondary | ICD-10-CM | POA: Diagnosis not present

## 2019-10-24 DIAGNOSIS — Z4689 Encounter for fitting and adjustment of other specified devices: Secondary | ICD-10-CM | POA: Diagnosis not present

## 2019-10-24 DIAGNOSIS — N816 Rectocele: Secondary | ICD-10-CM | POA: Diagnosis not present

## 2019-10-24 DIAGNOSIS — N952 Postmenopausal atrophic vaginitis: Secondary | ICD-10-CM | POA: Diagnosis not present

## 2019-10-31 DIAGNOSIS — M5030 Other cervical disc degeneration, unspecified cervical region: Secondary | ICD-10-CM | POA: Diagnosis not present

## 2019-10-31 DIAGNOSIS — Z86718 Personal history of other venous thrombosis and embolism: Secondary | ICD-10-CM | POA: Diagnosis not present

## 2019-10-31 DIAGNOSIS — M5136 Other intervertebral disc degeneration, lumbar region: Secondary | ICD-10-CM | POA: Diagnosis not present

## 2019-10-31 DIAGNOSIS — D6869 Other thrombophilia: Secondary | ICD-10-CM | POA: Diagnosis not present

## 2019-10-31 DIAGNOSIS — M961 Postlaminectomy syndrome, not elsewhere classified: Secondary | ICD-10-CM | POA: Diagnosis not present

## 2019-11-06 DIAGNOSIS — M545 Low back pain: Secondary | ICD-10-CM | POA: Diagnosis not present

## 2019-11-06 DIAGNOSIS — M961 Postlaminectomy syndrome, not elsewhere classified: Secondary | ICD-10-CM | POA: Diagnosis not present

## 2019-11-06 DIAGNOSIS — M5030 Other cervical disc degeneration, unspecified cervical region: Secondary | ICD-10-CM | POA: Diagnosis not present

## 2019-11-06 DIAGNOSIS — M5136 Other intervertebral disc degeneration, lumbar region: Secondary | ICD-10-CM | POA: Diagnosis not present

## 2019-11-15 DIAGNOSIS — M5136 Other intervertebral disc degeneration, lumbar region: Secondary | ICD-10-CM | POA: Diagnosis not present

## 2019-11-15 DIAGNOSIS — M5030 Other cervical disc degeneration, unspecified cervical region: Secondary | ICD-10-CM | POA: Diagnosis not present

## 2019-11-15 DIAGNOSIS — R52 Pain, unspecified: Secondary | ICD-10-CM | POA: Diagnosis not present

## 2019-11-15 DIAGNOSIS — M961 Postlaminectomy syndrome, not elsewhere classified: Secondary | ICD-10-CM | POA: Diagnosis not present

## 2019-12-15 ENCOUNTER — Telehealth: Payer: Self-pay

## 2019-12-15 ENCOUNTER — Other Ambulatory Visit: Payer: Self-pay | Admitting: Hematology and Oncology

## 2019-12-15 DIAGNOSIS — I2699 Other pulmonary embolism without acute cor pulmonale: Secondary | ICD-10-CM

## 2019-12-15 MED ORDER — APIXABAN 5 MG PO TABS: 5.0000 mg | ORAL_TABLET | Freq: Two times a day (BID) | ORAL | 3 refills | Status: DC

## 2019-12-15 NOTE — Telephone Encounter (Signed)
Called and given CT appt for 9/27 at 1230, npo for 4 hours prior. Drink first bottle of contrast at 1030 and 2nd bottle at 1130. Reviewed all upcoming appts. She verbalized understanding.

## 2019-12-26 ENCOUNTER — Inpatient Hospital Stay: Payer: Medicare Other

## 2019-12-26 ENCOUNTER — Other Ambulatory Visit: Payer: Self-pay

## 2019-12-26 ENCOUNTER — Encounter: Payer: Self-pay | Admitting: Gynecologic Oncology

## 2019-12-26 ENCOUNTER — Inpatient Hospital Stay: Payer: Medicare Other | Attending: Gynecologic Oncology | Admitting: Gynecologic Oncology

## 2019-12-26 VITALS — BP 130/48 | HR 79 | Temp 98.6°F | Ht 67.0 in | Wt 215.6 lb

## 2019-12-26 DIAGNOSIS — C561 Malignant neoplasm of right ovary: Secondary | ICD-10-CM | POA: Insufficient documentation

## 2019-12-26 DIAGNOSIS — Z7901 Long term (current) use of anticoagulants: Secondary | ICD-10-CM | POA: Insufficient documentation

## 2019-12-26 DIAGNOSIS — K219 Gastro-esophageal reflux disease without esophagitis: Secondary | ICD-10-CM | POA: Diagnosis not present

## 2019-12-26 DIAGNOSIS — Z8673 Personal history of transient ischemic attack (TIA), and cerebral infarction without residual deficits: Secondary | ICD-10-CM | POA: Insufficient documentation

## 2019-12-26 DIAGNOSIS — Z9221 Personal history of antineoplastic chemotherapy: Secondary | ICD-10-CM | POA: Insufficient documentation

## 2019-12-26 DIAGNOSIS — Z90710 Acquired absence of both cervix and uterus: Secondary | ICD-10-CM | POA: Diagnosis not present

## 2019-12-26 DIAGNOSIS — Z86718 Personal history of other venous thrombosis and embolism: Secondary | ICD-10-CM | POA: Diagnosis not present

## 2019-12-26 DIAGNOSIS — Z9079 Acquired absence of other genital organ(s): Secondary | ICD-10-CM | POA: Diagnosis not present

## 2019-12-26 DIAGNOSIS — Z90722 Acquired absence of ovaries, bilateral: Secondary | ICD-10-CM | POA: Insufficient documentation

## 2019-12-26 DIAGNOSIS — K5900 Constipation, unspecified: Secondary | ICD-10-CM | POA: Insufficient documentation

## 2019-12-26 DIAGNOSIS — C569 Malignant neoplasm of unspecified ovary: Secondary | ICD-10-CM

## 2019-12-26 DIAGNOSIS — Z86711 Personal history of pulmonary embolism: Secondary | ICD-10-CM | POA: Insufficient documentation

## 2019-12-26 DIAGNOSIS — N898 Other specified noninflammatory disorders of vagina: Secondary | ICD-10-CM | POA: Diagnosis not present

## 2019-12-26 DIAGNOSIS — Z79899 Other long term (current) drug therapy: Secondary | ICD-10-CM | POA: Diagnosis not present

## 2019-12-26 DIAGNOSIS — I10 Essential (primary) hypertension: Secondary | ICD-10-CM | POA: Insufficient documentation

## 2019-12-26 LAB — CBC WITH DIFFERENTIAL (CANCER CENTER ONLY)
Abs Immature Granulocytes: 0.01 10*3/uL (ref 0.00–0.07)
Basophils Absolute: 0 10*3/uL (ref 0.0–0.1)
Basophils Relative: 0 %
Eosinophils Absolute: 0.1 10*3/uL (ref 0.0–0.5)
Eosinophils Relative: 2 %
HCT: 34.7 % — ABNORMAL LOW (ref 36.0–46.0)
Hemoglobin: 11.7 g/dL — ABNORMAL LOW (ref 12.0–15.0)
Immature Granulocytes: 0 %
Lymphocytes Relative: 46 %
Lymphs Abs: 2.2 10*3/uL (ref 0.7–4.0)
MCH: 33.5 pg (ref 26.0–34.0)
MCHC: 33.7 g/dL (ref 30.0–36.0)
MCV: 99.4 fL (ref 80.0–100.0)
Monocytes Absolute: 0.7 10*3/uL (ref 0.1–1.0)
Monocytes Relative: 13 %
Neutro Abs: 1.9 10*3/uL (ref 1.7–7.7)
Neutrophils Relative %: 39 %
Platelet Count: 236 10*3/uL (ref 150–400)
RBC: 3.49 MIL/uL — ABNORMAL LOW (ref 3.87–5.11)
RDW: 13.3 % (ref 11.5–15.5)
WBC Count: 4.9 10*3/uL (ref 4.0–10.5)
nRBC: 0 % (ref 0.0–0.2)

## 2019-12-26 LAB — CMP (CANCER CENTER ONLY)
ALT: 24 U/L (ref 0–44)
AST: 22 U/L (ref 15–41)
Albumin: 3.7 g/dL (ref 3.5–5.0)
Alkaline Phosphatase: 77 U/L (ref 38–126)
Anion gap: 9 (ref 5–15)
BUN: 19 mg/dL (ref 8–23)
CO2: 24 mmol/L (ref 22–32)
Calcium: 9.9 mg/dL (ref 8.9–10.3)
Chloride: 106 mmol/L (ref 98–111)
Creatinine: 1.66 mg/dL — ABNORMAL HIGH (ref 0.44–1.00)
GFR, Est AFR Am: 34 mL/min — ABNORMAL LOW (ref >60)
GFR, Estimated: 30 mL/min — ABNORMAL LOW (ref >60)
Glucose, Bld: 109 mg/dL — ABNORMAL HIGH (ref 70–99)
Potassium: 4.2 mmol/L (ref 3.5–5.1)
Sodium: 139 mmol/L (ref 135–145)
Total Bilirubin: <0.2 mg/dL — ABNORMAL LOW (ref 0.3–1.2)
Total Protein: 6.8 g/dL (ref 6.5–8.1)

## 2019-12-26 MED ORDER — HEPARIN SOD (PORK) LOCK FLUSH 100 UNIT/ML IV SOLN
500.0000 [IU] | Freq: Once | INTRAVENOUS | Status: AC
  Administered 2019-12-26: 500 [IU]
  Filled 2019-12-26: qty 5

## 2019-12-26 MED ORDER — SODIUM CHLORIDE 0.9% FLUSH
10.0000 mL | Freq: Once | INTRAVENOUS | Status: AC
  Administered 2019-12-26: 10 mL
  Filled 2019-12-26: qty 10

## 2019-12-26 NOTE — Patient Instructions (Signed)
Dr Denman George will check a CA 125 today. She will see you back in 6 months. Please follow-up with Dr Alvy Bimler in 3 months.  Please notify Dr Denman George at phone number [phone] if you notice vaginal bleeding, new pelvic or abdominal pains, bloating, feeling full easy, or a change in bladder or bowel function.

## 2019-12-26 NOTE — Progress Notes (Signed)
Follow-up Note: Gyn-Onc  Consult was initially requested by Dr. Benjie Karvonen for the evaluation of Paula Peterson 79 y.o. female  CC:  Chief Complaint  Patient presents with  . Right ovarian epithelial cancer ([place])    Follow up   Assessment/Plan:  Paula. KRISSY OREBAUGH  is a 79 y.o.  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 Complete clinical response.   Recommend continued surveillance with 3 monthly exams and CA 125 assessments. CT abd/pelvis in 3 months.  Her vaginal discharge is from her pessary. She will follow-up with Dr Benjie Karvonen for management of this.  HPI: Paula Peterson is a 79 year old P4 who is seen in consultation at the request of Dr Benjie Karvonen for apparent stage IV ovarian cancer.   Her symptoms began in April/May, 2020.  She has bloating and early satiety. Shortness of breath with walking. She denied bleeding. She reported constipation with pain with defecation and narrowed stools.   CT abd/pelvis on 11/29/18 showed a moderate size right pleural effusion, a 1.1cm porta hepatis lymph node and a 18.4 x 9.8 cm cystic mass in the pelvis. There was only a scant amount of pelvic ascites present.   CA 125 was drawn on 12/05/18 and 1015  The ptaient's medical history is significant for lumbago, "weak, loose vocal cords", reflux. She had a total hysterectomy via laparotomy for menorrhagia in her 30's. Prior to that she'd had 4 vaginal deliveries.   She underwent thoracentesis to establish diagnosis on December 09, 2018. This revealed adenocarcinoma positive for CK7 and PAX 8 - for CK20 consistent with gynecologic primary.  She was started on carboplatin and paclitaxel chemotherapy on December 16, 2018.  Ca1 25  reduced to 948 on January 06, 2019 demonstrating response.  She developed a massive PE on 02/02/19 and was treated with admission to hospital and anticoagulant therapy at approximately cycle 3.   She was changed to Eliquis.  Due to the development of this massive PE she was considered not a good candidate for an interval debulking at that time.  She continued on to receive an additional 4 cycles of carboplatin paclitaxel with cycle 7 administered on April 28, 2019.  Most recent CA 125 on 04/28/19 was 56. This was after 7 cycles.   CT chest /abd/pelvis on May 05, 2019 showed partial response with interval decrease in the size of the large cystic mass in the central pelvic region, there was no substantial change in the mesenteric and omental soft tissue disease seen.  There was mild right hydronephrosis completely resolved in the interval.  There was no ascites.   On June 06, 2019 she underwent ex lap BSO omentectomy radical tumor debulking.  Intraoperative findings were significant for an omental cake adherent to the anterior abdominal wall and hepatic flexure.  10 cm right tube and ovary.  Surgically absent uterus and left tube and ovary.  There was granular nodularity across the right diaphragm.  The surgery was an optimal cytoreduction to R0 with no gross residual disease remaining.  The patient tolerated procedure well.  She resumed Eliquis postoperatively and had no adverse bleeding events.  Final pathology confirmed high-grade serous carcinoma with the primary site of the right ovary.  Based on  staging information from initial diagnosis she was staged as stage IVa. Genetic testing was negative for germline mutations.  A variant of unknown significance in BRCA2 was detected on the [facility] Next+RNA insight panel.  Interval Hx:  She completed 3 additional cycles of carboplatin and paclitaxel completing treatment on September 20, 2019 with a normal Ca1 25 level 11.9.  Post treatment CT  scan on 09/17/19 showed interval debulking, bilateral salpingo oophorectomy and omentectomy with resection of the dominant pelvic cystic lesion and the bandlike soft tissue seen previously in the right mesentery/omentum. Today's study is status shows new postoperative baseline for follow-up. 6 mm gastrohepatic ligament nodule seen on the previous study is stable. Continued attention on follow-up recommended. No new or progressive findings in the chest, abdomen, or pelvis to suggest disease progression.  Stable 1.4 cm focus of homogeneous enhancement in the anterior right liver. Stable since at least 11/29/2018 and likely benign.  She reported no symptoms of recurrence but did report brown vaginal discharge. She wears a pessary for prolapse.   Current Meds:  Outpatient Encounter Medications as of 12/26/2019  Medication Sig  . acetaminophen (TYLENOL) 325 MG tablet Take 650 mg by mouth every 6 (six) hours as needed for moderate pain or headache.   Marland Kitchen apixaban (ELIQUIS) 5 MG TABS tablet Take 1 tablet (5 mg total) by mouth 2 (two) times daily.  . celecoxib (CELEBREX) 200 MG capsule celecoxib 200 mg capsule  . esomeprazole (NEXIUM) 40 MG capsule Take 40 mg by mouth 2 (two) times daily.   Marland Kitchen levothyroxine (SYNTHROID) 50 MCG tablet TAKE ONE TABLET EVERY MORNING (Patient taking differently: Take 50 mcg by mouth daily before breakfast. )  . lidocaine-prilocaine (EMLA) cream Apply to affected area once (Patient taking differently: Apply 1 application topically daily as needed (port access). )  . losartan (COZAAR) 50 MG tablet Take 50 mg by mouth daily.  . Melatonin 3 MG TABS Take by mouth.  . ondansetron (ZOFRAN) 8 MG tablet Take 8 mg by mouth every 8 (eight) hours as needed.  . prochlorperazine (COMPAZINE) 10 MG tablet Take 10 mg by mouth every 6 (six) hours as needed.  . SENEXON-S 8.6-50 MG tablet TAKE 1 TO 2 TABLETS AT BEDTIME FOR AFTER SURGERY (DO NOT TAKE IF HAVING DIARRHEA)   No facility-administered  encounter medications on file as of 12/26/2019.    Allergy: No Known Allergies  Social Hx:   Social History   Socioeconomic History  . Marital status: Married    Spouse name: Paula Peterson  . Number of children: 3  . Years of education: Not on file  . Highest education level: Not on file  Occupational History  . Occupation: retired Oncologist  Tobacco Use  . Smoking status: Never Smoker  . Smokeless tobacco: Never Used  Vaping Use  . Vaping Use: Never used  Substance and Sexual Activity  . Alcohol use: No  . Drug use: No  . Sexual activity: Not Currently  Other Topics Concern  . Not on file  Social History Narrative  . Not on file   Social Determinants of Health   Financial Resource Strain:   . Difficulty of Paying Living Expenses:   Food Insecurity:   . Worried About Charity fundraiser in the Last Year:   . Arboriculturist in the Last Year:   Transportation Needs:   . Film/video editor (Medical):   Marland Kitchen Lack of Transportation (Non-Medical):   Physical Activity:   . Days of  Exercise per Week:   . Minutes of Exercise per Session:   Stress:   . Feeling of Stress :   Social Connections:   . Frequency of Communication with Friends and Family:   . Frequency of Social Gatherings with Friends and Family:   . Attends Religious Services:   . Active Member of Clubs or Organizations:   . Attends Archivist Meetings:   Marland Kitchen Marital Status:   Intimate Partner Violence:   . Fear of Current or Ex-Partner:   . Emotionally Abused:   Marland Kitchen Physically Abused:   . Sexually Abused:     Past Surgical Hx:  Past Surgical History:  Procedure Laterality Date  . ABDOMINAL HYSTERECTOMY     ovaries left  . BACK SURGERY    . DEBULKING Bilateral 06/06/2019   Procedure: RADICAL TUMOR DEBULKING;  Surgeon: Everitt Amber, MD;  Location: [facility];  Service: Gynecology;  Laterality: Bilateral;  . FRACTURE SURGERY    . IR IMAGING GUIDED PORT INSERTION  12/15/2018  . OMENTECTOMY Bilateral  06/06/2019   Procedure: OMENTECTOMY;  Surgeon: Everitt Amber, MD;  Location: [facility];  Service: Gynecology;  Laterality: Bilateral;  . SALPINGOOPHORECTOMY Bilateral 06/06/2019   Procedure: EXPLORATORY LAPAROTOMY,OPEN RIGHT SALPINGO OOPHORECTOMY;  Surgeon: Everitt Amber, MD;  Location: [facility];  Service: Gynecology;  Laterality: Bilateral;    Past Medical Hx:  Past Medical History:  Diagnosis Date  . Back pain   . DVT (deep venous thrombosis) ([place]) 02/02/2019   had DVT first that moved into lung  . Family history of stomach cancer   . Family history of stomach cancer   . GERD (gastroesophageal reflux disease)   . History of pulmonary embolus (PE) 02/02/2019   confirmed by CT chest- had DVT first that moved into lungs per patient  . Hypertension   . Pessary maintenance   . Stroke [facility])     Past Gynecological History:  SVD x 4, hysterectomy for benign menorrhagia in her 30's (total abdominal) No LMP recorded. Patient has had a hysterectomy.  Family Hx:  Family History  Problem Relation Age of Onset  . Stomach cancer Sister        dx 65s-50s  . Dementia Mother   . Cancer Sister        unk type, hysterectomy, dx 7s    Sister had ovarian cancer age 70's. She was treated with surgery and chemotherapy. She survived her cancer but died from CHF and DM complications.   Review of Systems:  Constitutional  Feels fatigued   ENT Normal appearing ears and nares bilaterally Skin/Breast  No rash, sores, jaundice, itching, dryness Cardiovascular  No chest pain, no shortness of breath on exertion, no edema  Pulmonary  No cough or wheeze.  Gastro Intestinal  No nausea, vomitting, or diarrhoea. No bright red blood per rectum, no abdominal pain, change in bowel movement, no bloating Genito Urinary  No frequency, urgency, dysuria, no pelvic pressure Musculo Skeletal  No myalgia, arthralgia, joint swelling or pain  Neurologic  No weakness, numbness, change in gait,  Psychology  No  depression, anxiety, insomnia.   Vitals:  Blood pressure (!) 130/48, pulse 79, temperature 98.6 F (37 C), temperature source Oral, height 5' 7"  (1.702 m), weight 215 lb 9.6 oz (97.8 kg), SpO2 98 %.  Physical Exam: WD in NAD Neck  Supple NROM, without any enlargements.  Lymph Node Survey No cervical supraclavicular or inguinal adenopathy Cardiovascular  Pulse normal rate, regularity and rhythm. S1 and S2 normal.  Lungs  Normal BS bilaterally Skin  No rash/lesions/breakdown  Psychiatry  Alert and oriented to person, place, and time  Abdomen  Normoactive bowel sounds, abdomen soft, non-tender and mildly obese (BMI 33) without evidence of hernia. Soft paramedian incision.  Back No CVA tenderness Genito Urinary  Pessary removed. Vaginal mucosa erythematous and friable but free of lesions. Old blood in vagina. Pessary replaced. No palpable masses in pelvis. Rectal  deferred Extremities  No bilateral cyanosis, clubbing or edema.    Thereasa Solo, MD  12/26/2019, 3:29 PM

## 2019-12-27 ENCOUNTER — Other Ambulatory Visit: Payer: Self-pay | Admitting: Hematology and Oncology

## 2019-12-27 ENCOUNTER — Telehealth: Payer: Self-pay | Admitting: Oncology

## 2019-12-27 DIAGNOSIS — R300 Dysuria: Secondary | ICD-10-CM | POA: Diagnosis not present

## 2019-12-27 DIAGNOSIS — K2100 Gastro-esophageal reflux disease with esophagitis, without bleeding: Secondary | ICD-10-CM | POA: Diagnosis not present

## 2019-12-27 DIAGNOSIS — N1832 Chronic kidney disease, stage 3b: Secondary | ICD-10-CM | POA: Diagnosis not present

## 2019-12-27 LAB — CA 125: Cancer Antigen (CA) 125: 16.5 U/mL (ref 0.0–38.1)

## 2019-12-27 NOTE — Telephone Encounter (Signed)
Faxed CMP results from yesterday to Dr. Arcola Jansky office.    Called Paula Peterson and advised her of CA 125 results.  She verbalized understanding and said she will see Dr. Quillian Quince this afternoon.

## 2019-12-27 NOTE — Telephone Encounter (Signed)
Called Paula Peterson and advised her of elevated creatinine results.  She said she is drinking fluids and is urinating a lot.  Advised her of Dr. Calton Dach recommendations to drink at least 8 cups of water per day, hold losartan and to make an appointment with her PCP, Dr. Quillian Quince today.  She verbalized agreement.

## 2020-01-02 DIAGNOSIS — N1832 Chronic kidney disease, stage 3b: Secondary | ICD-10-CM | POA: Diagnosis not present

## 2020-01-09 DIAGNOSIS — N1832 Chronic kidney disease, stage 3b: Secondary | ICD-10-CM | POA: Diagnosis not present

## 2020-01-17 DIAGNOSIS — K2100 Gastro-esophageal reflux disease with esophagitis, without bleeding: Secondary | ICD-10-CM | POA: Diagnosis not present

## 2020-01-17 DIAGNOSIS — N1832 Chronic kidney disease, stage 3b: Secondary | ICD-10-CM | POA: Diagnosis not present

## 2020-01-17 DIAGNOSIS — M5412 Radiculopathy, cervical region: Secondary | ICD-10-CM | POA: Diagnosis not present

## 2020-01-17 DIAGNOSIS — I10 Essential (primary) hypertension: Secondary | ICD-10-CM | POA: Diagnosis not present

## 2020-02-13 ENCOUNTER — Other Ambulatory Visit: Payer: Self-pay

## 2020-02-13 ENCOUNTER — Inpatient Hospital Stay: Payer: Medicare Other | Attending: Gynecologic Oncology

## 2020-02-13 DIAGNOSIS — C561 Malignant neoplasm of right ovary: Secondary | ICD-10-CM | POA: Diagnosis present

## 2020-02-13 DIAGNOSIS — Z452 Encounter for adjustment and management of vascular access device: Secondary | ICD-10-CM | POA: Insufficient documentation

## 2020-02-13 MED ORDER — SODIUM CHLORIDE 0.9% FLUSH
10.0000 mL | Freq: Once | INTRAVENOUS | Status: AC
  Administered 2020-02-13: 10 mL
  Filled 2020-02-13: qty 10

## 2020-02-13 NOTE — Patient Instructions (Signed)

## 2020-02-20 DIAGNOSIS — Z961 Presence of intraocular lens: Secondary | ICD-10-CM | POA: Diagnosis not present

## 2020-02-20 DIAGNOSIS — H26493 Other secondary cataract, bilateral: Secondary | ICD-10-CM | POA: Diagnosis not present

## 2020-02-20 DIAGNOSIS — H524 Presbyopia: Secondary | ICD-10-CM | POA: Diagnosis not present

## 2020-03-13 ENCOUNTER — Other Ambulatory Visit: Payer: Self-pay | Admitting: Hematology and Oncology

## 2020-03-13 DIAGNOSIS — E039 Hypothyroidism, unspecified: Secondary | ICD-10-CM

## 2020-03-13 DIAGNOSIS — R300 Dysuria: Secondary | ICD-10-CM | POA: Diagnosis not present

## 2020-03-13 DIAGNOSIS — C561 Malignant neoplasm of right ovary: Secondary | ICD-10-CM

## 2020-03-25 ENCOUNTER — Other Ambulatory Visit: Payer: Self-pay

## 2020-03-25 ENCOUNTER — Inpatient Hospital Stay: Payer: Medicare Other

## 2020-03-25 ENCOUNTER — Encounter (HOSPITAL_COMMUNITY): Payer: Self-pay

## 2020-03-25 ENCOUNTER — Inpatient Hospital Stay: Payer: Medicare Other | Attending: Gynecologic Oncology

## 2020-03-25 ENCOUNTER — Ambulatory Visit (HOSPITAL_COMMUNITY)
Admission: RE | Admit: 2020-03-25 | Discharge: 2020-03-25 | Disposition: A | Discharge status: Home / Self Care | Payer: Medicare Other | Source: Ambulatory Visit | Attending: Hematology and Oncology | Admitting: Hematology and Oncology

## 2020-03-25 DIAGNOSIS — C561 Malignant neoplasm of right ovary: Secondary | ICD-10-CM | POA: Insufficient documentation

## 2020-03-25 DIAGNOSIS — K661 Hemoperitoneum: Secondary | ICD-10-CM | POA: Diagnosis not present

## 2020-03-25 DIAGNOSIS — E039 Hypothyroidism, unspecified: Secondary | ICD-10-CM

## 2020-03-25 DIAGNOSIS — Z452 Encounter for adjustment and management of vascular access device: Secondary | ICD-10-CM | POA: Diagnosis not present

## 2020-03-25 DIAGNOSIS — I700 Atherosclerosis of aorta: Secondary | ICD-10-CM | POA: Diagnosis not present

## 2020-03-25 DIAGNOSIS — K3189 Other diseases of stomach and duodenum: Secondary | ICD-10-CM | POA: Diagnosis not present

## 2020-03-25 DIAGNOSIS — K639 Disease of intestine, unspecified: Secondary | ICD-10-CM | POA: Diagnosis not present

## 2020-03-25 DIAGNOSIS — C569 Malignant neoplasm of unspecified ovary: Secondary | ICD-10-CM

## 2020-03-25 LAB — CMP (CANCER CENTER ONLY)
ALT: 22 U/L (ref 0–44)
AST: 19 U/L (ref 15–41)
Albumin: 3.7 g/dL (ref 3.5–5.0)
Alkaline Phosphatase: 84 U/L (ref 38–126)
Anion gap: 5 (ref 5–15)
BUN: 12 mg/dL (ref 8–23)
CO2: 30 mmol/L (ref 22–32)
Calcium: 10.2 mg/dL (ref 8.9–10.3)
Chloride: 102 mmol/L (ref 98–111)
Creatinine: 0.82 mg/dL (ref 0.44–1.00)
GFR, Est AFR Am: >60 mL/min (ref >60)
GFR, Estimated: >60 mL/min (ref >60)
Glucose, Bld: 97 mg/dL (ref 70–99)
Potassium: 4.1 mmol/L (ref 3.5–5.1)
Sodium: 137 mmol/L (ref 135–145)
Total Bilirubin: 0.3 mg/dL (ref 0.3–1.2)
Total Protein: 7.1 g/dL (ref 6.5–8.1)

## 2020-03-25 LAB — CBC WITH DIFFERENTIAL (CANCER CENTER ONLY)
Abs Immature Granulocytes: 0.02 10*3/uL (ref 0.00–0.07)
Basophils Absolute: 0 10*3/uL (ref 0.0–0.1)
Basophils Relative: 0 %
Eosinophils Absolute: 0.1 10*3/uL (ref 0.0–0.5)
Eosinophils Relative: 1 %
HCT: 38.4 % (ref 36.0–46.0)
Hemoglobin: 12.9 g/dL (ref 12.0–15.0)
Immature Granulocytes: 0 %
Lymphocytes Relative: 44 %
Lymphs Abs: 2.2 10*3/uL (ref 0.7–4.0)
MCH: 32.8 pg (ref 26.0–34.0)
MCHC: 33.6 g/dL (ref 30.0–36.0)
MCV: 97.7 fL (ref 80.0–100.0)
Monocytes Absolute: 0.4 10*3/uL (ref 0.1–1.0)
Monocytes Relative: 9 %
Neutro Abs: 2.2 10*3/uL (ref 1.7–7.7)
Neutrophils Relative %: 46 %
Platelet Count: 272 10*3/uL (ref 150–400)
RBC: 3.93 MIL/uL (ref 3.87–5.11)
RDW: 13.6 % (ref 11.5–15.5)
WBC Count: 5 10*3/uL (ref 4.0–10.5)
nRBC: 0 % (ref 0.0–0.2)

## 2020-03-25 LAB — TSH: TSH: 7.992 u[IU]/mL — ABNORMAL HIGH (ref 0.308–3.960)

## 2020-03-25 MED ORDER — SODIUM CHLORIDE 0.9% FLUSH
10.0000 mL | Freq: Once | INTRAVENOUS | Status: AC
  Administered 2020-03-25: 10 mL
  Filled 2020-03-25: qty 10

## 2020-03-25 MED ORDER — IOHEXOL 300 MG/ML  SOLN
100.0000 mL | Freq: Once | INTRAMUSCULAR | Status: AC | PRN
  Administered 2020-03-25: 100 mL via INTRAVENOUS

## 2020-03-25 MED ORDER — HEPARIN SOD (PORK) LOCK FLUSH 100 UNIT/ML IV SOLN: 500.0000 [IU] | Freq: Once | INTRAVENOUS | Status: DC

## 2020-03-25 MED ORDER — HEPARIN SOD (PORK) LOCK FLUSH 100 UNIT/ML IV SOLN
INTRAVENOUS | Status: AC
  Administered 2020-03-25: 500 [IU] via INTRAVENOUS
  Filled 2020-03-25: qty 5

## 2020-03-25 NOTE — Patient Instructions (Signed)

## 2020-03-26 ENCOUNTER — Other Ambulatory Visit: Payer: Self-pay

## 2020-03-26 ENCOUNTER — Encounter: Payer: Self-pay | Admitting: Hematology and Oncology

## 2020-03-26 ENCOUNTER — Inpatient Hospital Stay: Payer: Medicare Other | Admitting: Hematology and Oncology

## 2020-03-26 ENCOUNTER — Telehealth: Payer: Self-pay

## 2020-03-26 VITALS — BP 119/54 | HR 78 | Temp 97.5°F | Resp 18 | Ht 67.0 in | Wt 213.4 lb

## 2020-03-26 DIAGNOSIS — Z7189 Other specified counseling: Secondary | ICD-10-CM

## 2020-03-26 DIAGNOSIS — C561 Malignant neoplasm of right ovary: Secondary | ICD-10-CM

## 2020-03-26 DIAGNOSIS — E782 Mixed hyperlipidemia: Secondary | ICD-10-CM | POA: Diagnosis not present

## 2020-03-26 DIAGNOSIS — Z452 Encounter for adjustment and management of vascular access device: Secondary | ICD-10-CM | POA: Diagnosis not present

## 2020-03-26 DIAGNOSIS — K2100 Gastro-esophageal reflux disease with esophagitis, without bleeding: Secondary | ICD-10-CM | POA: Diagnosis not present

## 2020-03-26 DIAGNOSIS — I10 Essential (primary) hypertension: Secondary | ICD-10-CM | POA: Diagnosis not present

## 2020-03-26 DIAGNOSIS — E039 Hypothyroidism, unspecified: Secondary | ICD-10-CM | POA: Diagnosis not present

## 2020-03-26 DIAGNOSIS — Z0001 Encounter for general adult medical examination with abnormal findings: Secondary | ICD-10-CM | POA: Diagnosis not present

## 2020-03-26 DIAGNOSIS — N1832 Chronic kidney disease, stage 3b: Secondary | ICD-10-CM | POA: Diagnosis not present

## 2020-03-26 LAB — CA 125: Cancer Antigen (CA) 125: 249 U/mL — ABNORMAL HIGH (ref 0.0–38.1)

## 2020-03-26 NOTE — Progress Notes (Signed)
[facility] OFFICE PROGRESS NOTE  Patient Care Team: Caryl Bis, MD as PCP - General (Family Medicine) Awanda Mink Craige Cotta, RN as Oncology Nurse Navigator (Oncology)  ASSESSMENT & PLAN:  Right ovarian epithelial cancer [facility]) I reviewed blood work and CT imaging with the patient So far, she is not symptomatic but her tumor marker is markedly elevated and CT imaging show evidence of peritoneal disease We discussed the risk, benefits, side effects of treatment with the current guidelines  We discussed either combination of carboplatin with gemcitabine, carboplatin with Doxil or carboplatin with Taxol The risk, benefits, side effects of each treatment options were fully discussed and she elected for combination of carboplatin with Doxil  We reviewed the current guidelines The recommendation is based on publication below:  [employer] (2012) 107, [phone] & 2012 [employer] [place] All rights reserved 0007 - 0920/12  Final overall survival results of phase III [facility] CALYPSO trial of pegylated liposomal doxorubicin and carboplatin vs paclitaxel and carboplatin in platinum-sensitive ovarian cancer patients  BACKGROUND: The CALYPSO phase III trial compared CD (carboplatin-pegylated liposomal doxorubicin (PLD)) with CP (carboplatinpaclitaxel) in patients with platinum-sensitive recurrent ovarian cancer (ROC). Overall survival (OS) data are now mature. METHODS: Women with ROC relapsing 46 months after first- or second-line therapy were randomised to CD or CP for six cycles in this international, open-label, non-inferiority trial. The primary endpoint was progression-free survival. The OS analysis is presented here. RESULTS: A total of 976 patients were randomised (467 to CD and 509 to CP). With a median follow-up of 49 months, no statistically significant difference was observed between arms in OS (hazard ratio0.99 (95% confidence interval 0.85, 1.16); log-rank P0.94). Median  survival times were 30.7 months (CD) and 33.0 months (CP). No statistically significant difference in OS was observed between arms in predetermined subgroups according to age, body mass index, treatment-free interval, measurable disease, number of lines of prior chemotherapy, or performance status. Post-study cross-over was imbalanced between arms, with a greater proportion of patients randomised to CP receiving post-study PLD (68%) than patients randomised to CD receiving post-study paclitaxel (43%; Po0.001). CONCLUSION: Carboplatin-PLD led to delayed progression and similar OS compared with carboplatin-paclitaxel in platinum-sensitive ROC.  We discussed the role of chemotherapy. The intent is of palliative intent.  We discussed some of the risks, benefits, side-effects of carboplatin and liposomal doxorubicin  Some of the short term side-effects included, though not limited to, including weight loss, life threatening infections, risk of allergic reactions, need for transfusions of blood products, nausea, vomiting, change in bowel habits, loss of hair, risk of congestive heart failure, admission to hospital for various reasons, and risks of death.   Long term side-effects are also discussed including risks of infertility, permanent damage to nerve function, hearing loss, chronic fatigue, kidney damage with possibility needing hemodialysis, and rare secondary malignancy including bone marrow disorders.  The patient is aware that the response rates discussed earlier is not guaranteed.  After a long discussion, patient made an informed decision to proceed with the prescribed plan of care.  We discussed the use of cardiac imaging before and during treatment and close monitoring for signs of heart failure while on treatment  I do not plan upfront G-CSF support I will see her back next week for final visit before chemotherapy  Goals of care, counseling/discussion With recurrent cancer, her condition is  treatable but not considered curable After she completes 6 cycles of treatment, if she achieved complete response, my plan would be  to start her on maintenance treatment   Orders Placed This Encounter  Procedures  . CBC with Differential (Cancer Center Only)    Standing Status:   Standing    Number of Occurrences:   20    Standing Expiration Date:   03/26/2021  . CMP ([facility] only)    Standing Status:   Standing    Number of Occurrences:   20    Standing Expiration Date:   03/26/2021  . ECHOCARDIOGRAM COMPLETE    Standing Status:   Future    Standing Expiration Date:   03/26/2021    Order Specific Question:   Where should this test be performed    Answer:   [place]    Order Specific Question:   Perflutren DEFINITY (image enhancing agent) should be administered unless hypersensitivity or allergy exist    Answer:   Administer Perflutren    Order Specific Question:   Is a special reader required? (athlete or structural heart)    Answer:   No    Order Specific Question:   Does this study need to be read by the Structural team/Level 3 readers?    Answer:   No    Order Specific Question:   Reason for exam-Echo    Answer:   Chemotherapy evaluation  v87.41 / v58.11    All questions were answered. The patient knows to call the clinic with any problems, questions or concerns. The total time spent in the appointment was 55 minutes encounter with patients including review of chart and various tests results, discussions about plan of care and coordination of care plan   [name], MD 03/26/2020 2:52 PM  INTERVAL HISTORY: Please see below for problem oriented charting. 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   She has bloating and early satiety. Shortness of breath with walking. She denied bleeding. She reported constipation with pain with defecation and narrowed stools   11/29/2018 Imaging   1. 13 cm complex cystic lesion in the central pelvis, highly suspicious for ovarian cystadenocarcinoma. 2. Diffuse peritoneal carcinomatosis with mild ascites. 3. Mild lymphadenopathy in porta hepatis and right cardiophrenic angle, suspicious for metastatic disease. 4. Moderate right and tiny left pleural effusions   12/05/2018 Tumor Marker   Patient's tumor was tested for the following markers: CA-125 Results of the tumor marker test revealed 1015.   12/06/2018 Cancer Staging   Staging form: Ovary, Fallopian Tube, and Primary Peritoneal Carcinoma, AJCC 8th Edition - Clinical: FIGO Stage IVA, calculated as Stage IV (cT3c, cN1, cM1) - Signed by [name], MD on 12/06/2018   12/09/2018 Pathology Results   PLEURAL FLUID, RIGHT (SPECIMEN 1 OF 1 COLLECTED 12/09/18): - MALIGNANT CELLS CONSISTENT WITH METASTATIC ADENOCARCINOMA - SEE COMMENT Comment The neoplastic cells are positive for cytokeratin 7 and Pax-8 but negative for cytokeratin 20, TTF-1, CDX-2 and Gata-3. Overall, the phenotype is consistent with the clinical impression of gynecologic primary.    12/15/2018 Procedure   Placement of single lumen port a cath via right internal jugular vein. The catheter tip lies at the cavo-atrial junction. A power injectable port a cath was placed and is ready for immediate use   12/16/2018 - 08/24/2019 Chemotherapy   The patient had carboplatin and taxol for  chemotherapy treatment.  She had 7 cycles given neoadjuvant prior to surgery and 3 more cycles after surgery, for a total of 10 cycles of treatment    01/06/2019 Tumor Marker   Patient's tumor was tested for the following markers: CA-125 Results of the tumor marker test revealed 948   02/02/2019 Imaging   1. Massive pulmonary embolism, as discussed above. Given the  mildly elevated RV to LV ratio of 0.95, this is associated with increased risk of morbidity and mortality. 2. Today's study demonstrates a mixed response to therapy. Specifically, while there has been regression of the bulky intraperitoneal metastatic disease and regression of previously noted pleural effusions, the large cystic mass in the central pelvis has increased in size compared to the prior study. 3. New right mild hydroureteronephrosis related to extrinsic compression on the distal third of the right ureter by the patient's large pelvic mass. 4. Scattered small pulmonary nodules (predominantly pleural based) appear stable compared to prior examinations. These are nonspecific but warrant continued attention on follow-up studies. 5. Aortic atherosclerosis, in addition to three-vessel coronary artery disease. Assessment for potential risk factor modification, dietary therapy or pharmacologic therapy may be warranted, if clinically indicated.   02/02/2019 - 02/04/2019 Hospital Admission   She was admitted the hospital due to DVT and PE   02/03/2019 Imaging   Bilateral venous Doppler US Right: Findings consistent with acute deep vein thrombosis involving the right femoral vein, right popliteal vein, right peroneal veins, right soleal veins, and right gastrocnemius veins. No cystic structure found in the popliteal fossa. Left: There is no evidence of deep vein thrombosis in the lower extremity. No cystic structure found in the popliteal fossa   02/17/2019 Tumor Marker   Patient's tumor was tested for the following markers: CA-125 Results of the tumor marker test revealed 127   03/10/2019 Tumor Marker   Patient's tumor was tested for the following markers: CA-125. Results of the tumor marker test revealed 87.4   03/14/2019 - 03/16/2019 Hospital Admission   She was admitted to the hospital recently for weakness   03/31/2019 Tumor Marker   Patient's tumor was tested for the following markers:  CA-125 Results of the tumor marker test revealed 59.2.   04/28/2019 Tumor Marker   Patient's tumor was tested for the following markers: CA-125 Results of the tumor marker test revealed 56.8   05/05/2019 Imaging   1. Interval decrease in size of the large cystic mass in the central pelvis. Mesenteric and omental soft tissue disease shows no substantial interval change. 2. The mild right hydroureteronephrosis seen previously has resolved in the interval. 3. Small residual nonobstructive thrombus identified in the inter lobar pulmonary artery common here into the lateral wall compatible with chronicity. 4. 14 mm subtle enhancing lesion in the anterior right liver is stable. This may be vascular malformation. Attention on follow-up recommended. 5. Stable appearance of the multiple small bilateral pulmonary nodules. Continued attention on follow-up recommended. 6.  Aortic Atherosclerois (ICD10-170.0)     06/06/2019 Pathology Results   A. OMENTUM, RESECTION: - Metastatic carcinoma. B. RIGHT FALLOPIAN TUBE AND OVARY, SALPINGOOOPHORECTOMY: - High-grade serous carcinoma, spanning 9 cm. - No surface involvement identified. - Fallopian tube involved by carcinoma. - See oncology table. C. PERITONEAL NODULE, EXCISION: - Metastatic carcinoma. ONCOLOGY TABLE: OVARY or FALLOPIAN TUBE or PRIMARY PERITONEUM: Procedure: Right salpingo-oophorectomy, omental resection, and peritoneal biopsy. Specimen Integrity: Intact Tumor Site: Right ovary Ovarian Surface Involvement (required only if applicable): Not identified Tumor Size: 9 cm Histologic Type: High-grade  serous carcinoma Histologic Grade: High-grade Implants (required for advanced stage serous/seromucinous borderline tumors only): Omentum, peritoneum. Other Tissue/ Organ Involvement: Right fallopian tube Largest Extrapelvic Peritoneal Focus (required only if applicable): 7.3 cm Peritoneal/Ascitic Fluid: Positive pleural fluid pre neoadjuvant  therapy. Treatment Effect (required only for high-grade serous carcinomas): Probable treatment effect present. Regional Lymph Nodes: No lymph nodes submitted or found Pathologic Stage Classification (pTNM, AJCC 8th Edition): ypT3c, ypNX Representative Tumor Block: B2   06/06/2019 Surgery   Preoperative Diagnosis: stage IV ovarian cancer, s/p neoadjuvant chemotherapy, history of recent PE.  Procedure(s) Performed: Exploratory laparotomy with right salpingo-oophorectomy, omentectomy radical tumor debulking for ovarian cancer .   Surgeon: Thereasa Solo, MD.    Operative Findings:  Omental cake adherent to anterior abdominal wall and hepatic flexure. 10cm right tube and ovary. Surgically absent uterus and left tube and ovary. Granular nodularity across right diaphragm.    This represented an optimal cytoreduction (R0) with no gross visible disease remaining.    07/06/2019 Tumor Marker   Patient's tumor was tested for the following markers:CA-125 Results of the tumor marker test revealed 16.4   08/10/2019 Genetic Testing   Negative genetic testing. No pathogenic variants identified. VUS in BRCA2 called [id number] identified on the [facility] CancerNext+RNAinsight panel. The report date is 08/10/2019.  The CancerNext+RNAinsight gene panel offered by [facility] includes sequencing and rearrangement analysis for the following 36 genes: APC*, ATM*, AXIN2, BARD1, BMPR1A, BRCA1*, BRCA2*, BRIP1*, CDH1*, CDK4, CDKN2A, CHEK2*, DICER1, MLH1*, MSH2*, MSH3, MSH6*, MUTYH*, NBN, NF1*, NTHL1, PALB2*, PMS2*, PTEN*, RAD51C*, RAD51D*, RECQL, SMAD4, SMARCA4, STK11 and TP53* (sequencing and deletion/duplication); HOXB13, POLD1 and POLE (sequencing only); EPCAM and GREM1 (deletion/duplication only). DNA and RNA analyses performed for * genes.   HRD (somatic) testing was initially ordered and was not completed due to insufficient amount of tumor sample.    08/24/2019 Tumor Marker   Patient's tumor was tested for the  following markers: CA-125 Results of the tumor marker test revealed 12.2   09/20/2019 Imaging   1. Interval debulking, bilateral salpingo oophorectomy and omentectomy with resection of the dominant pelvic cystic lesion and the bandlike soft tissue seen previously in the right mesentery/omentum. Today's study is status shows new postoperative baseline for follow-up. 2. 6 mm gastrohepatic ligament nodule seen on the previous study is stable. Continued attention on follow-up recommended. 3. No new or progressive findings in the chest, abdomen, or pelvis to suggest disease progression. 4. Stable 1.4 cm focus of homogeneous enhancement in the anterior right liver. Stable since at least 11/29/2018 and likely benign. Continued attention on follow-up recommended. 5. Aortic Atherosclerosis (ICD10-I70.0).   09/20/2019 Tumor Marker   Patient's tumor was tested for the following markers: CA-125 Results of the tumor marker test revealed 11.9   12/26/2019 Tumor Marker   Patient's tumor was tested for the following markers: CA-125 Results of the tumor marker test revealed 16.5   03/25/2020 Imaging   1. Interval progression of peritoneal disease which predominantly involves the serosal surface of the proximal and distal transverse colon, and descending colon. 2. New bilateral inguinal adenopathy. 3. No ascites. 4. Aortic atherosclerosis.   03/25/2020 Tumor Marker   Patient's tumor was tested for the following markers: CA-125 Results of the tumor marker test revealed 249   04/02/2020 -  Chemotherapy   The patient had palonosetron (ALOXI) injection 0.25 mg, 0.25 mg, Intravenous,  Once, 0 of 6 cycles CARBOplatin (PARAPLATIN) 490 mg in sodium chloride 0.9 % 250 mL chemo infusion, 490 mg (100 % of  original dose 490.5 mg), Intravenous,  Once, 0 of 6 cycles Dose modification:   (original dose 490.5 mg, Cycle 1) DOXOrubicin HCL LIPOSOMAL (DOXIL) 64 mg in dextrose 5 % 250 mL chemo infusion, 30 mg/m2 = 64 mg,  Intravenous,  Once, 0 of 6 cycles fosaprepitant (EMEND) 150 mg in sodium chloride 0.9 % 145 mL IVPB, 150 mg, Intravenous,  Once, 0 of 6 cycles  for chemotherapy treatment.      REVIEW OF SYSTEMS:   Constitutional: Denies fevers, chills or abnormal weight loss Eyes: Denies blurriness of vision Ears, nose, mouth, throat, and face: Denies mucositis or sore throat Respiratory: Denies cough, dyspnea or wheezes Cardiovascular: Denies palpitation, chest discomfort or lower extremity swelling Gastrointestinal:  Denies nausea, heartburn or change in bowel habits Skin: Denies abnormal skin rashes Lymphatics: Denies new lymphadenopathy or easy bruising Neurological:Denies numbness, tingling or new weaknesses Behavioral/Psych: Mood is stable, no new changes  All other systems were reviewed with the patient and are negative.  I have reviewed the past medical history, past surgical history, social history and family history with the patient and they are unchanged from previous note.  ALLERGIES:  has No Known Allergies.  MEDICATIONS:  Current Outpatient Medications  Medication Sig Dispense Refill  . acetaminophen (TYLENOL) 325 MG tablet Take 650 mg by mouth every 6 (six) hours as needed for moderate pain or headache.     Marland Kitchen apixaban (ELIQUIS) 5 MG TABS tablet Take 1 tablet (5 mg total) by mouth 2 (two) times daily. 60 tablet 3  . celecoxib (CELEBREX) 200 MG capsule celecoxib 200 mg capsule    . esomeprazole (NEXIUM) 40 MG capsule Take 40 mg by mouth 2 (two) times daily.     Marland Kitchen levothyroxine (SYNTHROID) 50 MCG tablet TAKE ONE TABLET EVERY MORNING (Patient taking differently: Take 50 mcg by mouth daily before breakfast. ) 30 tablet 9  . lidocaine-prilocaine (EMLA) cream Apply to affected area once (Patient taking differently: Apply 1 application topically daily as needed (port access). ) 30 g 3  . losartan (COZAAR) 50 MG tablet Take 50 mg by mouth daily.    . Melatonin 3 MG TABS Take by mouth.    .  ondansetron (ZOFRAN) 8 MG tablet Take 8 mg by mouth every 8 (eight) hours as needed.    . prochlorperazine (COMPAZINE) 10 MG tablet Take 10 mg by mouth every 6 (six) hours as needed.    . SENEXON-S 8.6-50 MG tablet TAKE 1 TO 2 TABLETS AT BEDTIME FOR AFTER SURGERY (DO NOT TAKE IF HAVING DIARRHEA) 30 tablet 0   No current facility-administered medications for this visit.    PHYSICAL EXAMINATION: ECOG PERFORMANCE STATUS: 1 - Symptomatic but completely ambulatory  Vitals:   03/26/20 1046  BP: (!) 119/54  Pulse: 78  Resp: 18  Temp: (!) 97.5 F (36.4 C)  SpO2: 99%   Filed Weights   03/26/20 1046  Weight: 213 lb 6.4 oz (96.8 kg)    GENERAL:alert, no distress and comfortable NEURO: alert & oriented x 3 with fluent speech, no focal motor/sensory deficits  LABORATORY DATA:  I have reviewed the data as listed    Component Value Date/Time   NA 137 03/25/2020 1144   K 4.1 03/25/2020 1144   CL 102 03/25/2020 1144   CO2 30 03/25/2020 1144   GLUCOSE 97 03/25/2020 1144   BUN 12 03/25/2020 1144   CREATININE 0.82 03/25/2020 1144   CALCIUM 10.2 03/25/2020 1144   PROT 7.1 03/25/2020 1144   ALBUMIN 3.7  03/25/2020 1144   AST 19 03/25/2020 1144   ALT 22 03/25/2020 1144   ALKPHOS 84 03/25/2020 1144   BILITOT 0.3 03/25/2020 1144   GFRNONAA >60 03/25/2020 1144   GFRAA >60 03/25/2020 1144    No results found for: SPEP, UPEP  Lab Results  Component Value Date   WBC 5.0 03/25/2020   NEUTROABS 2.2 03/25/2020   HGB 12.9 03/25/2020   HCT 38.4 03/25/2020   MCV 97.7 03/25/2020   PLT 272 03/25/2020      Chemistry      Component Value Date/Time   NA 137 03/25/2020 1144   K 4.1 03/25/2020 1144   CL 102 03/25/2020 1144   CO2 30 03/25/2020 1144   BUN 12 03/25/2020 1144   CREATININE 0.82 03/25/2020 1144      Component Value Date/Time   CALCIUM 10.2 03/25/2020 1144   ALKPHOS 84 03/25/2020 1144   AST 19 03/25/2020 1144   ALT 22 03/25/2020 1144   BILITOT 0.3 03/25/2020 1144        RADIOGRAPHIC STUDIES: I have reviewed multiple imaging studies with the patient I have personally reviewed the radiological images as listed and agreed with the findings in the report. CT ABDOMEN PELVIS W CONTRAST  Result Date: 03/25/2020 CLINICAL DATA:  Restaging ovarian cancer. Follow-up peritoneal nodule. EXAM: CT ABDOMEN AND PELVIS WITH CONTRAST TECHNIQUE: Multidetector CT imaging of the abdomen and pelvis was performed using the standard protocol following bolus administration of intravenous contrast. CONTRAST:  186m OMNIPAQUE IOHEXOL 300 MG/ML  SOLN COMPARISON:  09/20/2019 FINDINGS: Lower chest: No acute abnormality. Hepatobiliary: No focal liver abnormality identified. No gallstones or signs of gallbladder wall thickening or inflammation. No biliary ductal dilatation. Pancreas: Unremarkable. No pancreatic ductal dilatation or surrounding inflammatory changes. Spleen: Normal in size without focal abnormality. Adrenals/Urinary Tract: Normal adrenal glands. No kidney mass or hydronephrosis identified. Urinary bladder is unremarkable. Stomach/Bowel: The stomach is nondistended. There is no small bowel wall thickening, inflammation or distension. No pathologic dilatation of the colon. Asymmetric wall thickening is noted along the anterior wall of the proximal transverse colon which appears progressive from previous exam, image 53/2 and is suspicious for serosal disease. There is a new serosal implant along the distal transverse colon measuring 1.3 cm in thickness, new from previous exam. Lastly, there is progressive serosal disease involving the descending colon, image 41/4 and image 39/4. This is new from previous exam. No pathologic dilatation of the bowel loops. Vascular/Lymphatic: Aortic atherosclerosis. No aneurysm. No retroperitoneal or mesenteric adenopathy. No iliac adenopathy. Left inguinal lymph node is enlarged measuring 1.6 cm, new from previous exam, image 76/2. Right inguinal lymph node  is enlarged measuring 1.3 cm, image 76/2. Also new. Reproductive: Status post hysterectomy. Other: No ascites. Previous gastrocolic ligament nodule measures 8 mm, image 45/2. Previously 6 mm. Soft tissue nodule along the undersurface of the ventral abdominal wall measures 0.9 cm, image 48/2. New from previous exam. Musculoskeletal: No acute or significant osseous findings. Degenerative disc disease noted within the lumbar spine. No acute or suspicious osseous findings. IMPRESSION: 1. Interval progression of peritoneal disease which predominantly involves the serosal surface of the proximal and distal transverse colon, and descending colon. 2. New bilateral inguinal adenopathy. 3. No ascites. 4. Aortic atherosclerosis. Aortic Atherosclerosis (ICD10-I70.0). Electronically Signed   By: TKerby MoorsM.D.   On: 03/25/2020 16:16

## 2020-03-26 NOTE — Telephone Encounter (Signed)
-----   Message from [name], MD sent at 03/26/2020  2:49 PM EDT ----- Regarding: echo Pls schedule ECHO this week I sent scheduling msg to see her next week and start chemo same day, scheduler will call

## 2020-03-26 NOTE — Progress Notes (Signed)
DISCONTINUE ON PATHWAY REGIMEN - Ovarian     A cycle is every 21 days:     Paclitaxel      Carboplatin   **Always confirm dose/schedule in your pharmacy ordering system**  REASON: Other Reason PRIOR TREATMENT: [id number]: Carboplatin AUC=6 + Paclitaxel 175 mg/m2 q21 Days x 2-4 Cycles TREATMENT RESPONSE: Complete Response (CR)  START ON PATHWAY REGIMEN - Ovarian     A cycle is every 28 days:     Carboplatin      Liposomal doxorubicin   **Always confirm dose/schedule in your pharmacy ordering system**  Patient Characteristics: Recurrent or Progressive Disease, Second Line, Platinum Sensitive and ? 6 Months Since Last Therapy, Not a Candidate for Secondary Debulking Surgery BRCA Mutation Status: Absent Therapeutic Status: Recurrent or Progressive Disease Line of Therapy: Second Line  Intent of Therapy: Non-Curative / Palliative Intent, Discussed with Patient

## 2020-03-26 NOTE — Assessment & Plan Note (Signed)
I reviewed blood work and CT imaging with the patient So far, she is not symptomatic but her tumor marker is markedly elevated and CT imaging show evidence of peritoneal disease We discussed the risk, benefits, side effects of treatment with the current guidelines  We discussed either combination of carboplatin with gemcitabine, carboplatin with Doxil or carboplatin with Taxol The risk, benefits, side effects of each treatment options were fully discussed and she elected for combination of carboplatin with Doxil  We reviewed the current guidelines The recommendation is based on publication below:  [place] (2012) 107, [phone] & 2012 [employer] [place] All rights reserved 0007 - 0920/12  Final overall survival results of phase III [facility] CALYPSO trial of pegylated liposomal doxorubicin and carboplatin vs paclitaxel and carboplatin in platinum-sensitive ovarian cancer patients  BACKGROUND: The CALYPSO phase III trial compared CD (carboplatin-pegylated liposomal doxorubicin (PLD)) with CP (carboplatinpaclitaxel) in patients with platinum-sensitive recurrent ovarian cancer (ROC). Overall survival (OS) data are now mature. METHODS: Women with ROC relapsing 46 months after first- or second-line therapy were randomised to CD or CP for six cycles in this international, open-label, non-inferiority trial. The primary endpoint was progression-free survival. The OS analysis is presented here. RESULTS: A total of 976 patients were randomised (467 to CD and 509 to CP). With a median follow-up of 49 months, no statistically significant difference was observed between arms in OS (hazard ratio0.99 (95% confidence interval 0.85, 1.16); log-rank P0.94). Median survival times were 30.7 months (CD) and 33.0 months (CP). No statistically significant difference in OS was observed between arms in predetermined subgroups according to age, body mass index, treatment-free interval, measurable disease, number  of lines of prior chemotherapy, or performance status. Post-study cross-over was imbalanced between arms, with a greater proportion of patients randomised to CP receiving post-study PLD (68%) than patients randomised to CD receiving post-study paclitaxel (43%; Po0.001). CONCLUSION: Carboplatin-PLD led to delayed progression and similar OS compared with carboplatin-paclitaxel in platinum-sensitive ROC.  We discussed the role of chemotherapy. The intent is of palliative intent.  We discussed some of the risks, benefits, side-effects of carboplatin and liposomal doxorubicin  Some of the short term side-effects included, though not limited to, including weight loss, life threatening infections, risk of allergic reactions, need for transfusions of blood products, nausea, vomiting, change in bowel habits, loss of hair, risk of congestive heart failure, admission to hospital for various reasons, and risks of death.   Long term side-effects are also discussed including risks of infertility, permanent damage to nerve function, hearing loss, chronic fatigue, kidney damage with possibility needing hemodialysis, and rare secondary malignancy including bone marrow disorders.  The patient is aware that the response rates discussed earlier is not guaranteed.  After a long discussion, patient made an informed decision to proceed with the prescribed plan of care.  We discussed the use of cardiac imaging before and during treatment and close monitoring for signs of heart failure while on treatment  I do not plan upfront G-CSF support I will see her back next week for final visit before chemotherapy

## 2020-03-26 NOTE — Assessment & Plan Note (Signed)
With recurrent cancer, her condition is treatable but not considered curable After she completes 6 cycles of treatment, if she achieved complete response, my plan would be to start her on maintenance treatment

## 2020-03-26 NOTE — Telephone Encounter (Signed)
Called and given below message. Echo scheduled for 9/30 at 10 am at [facility]. She verbalized understanding.

## 2020-03-28 ENCOUNTER — Telehealth: Payer: Self-pay | Admitting: Hematology and Oncology

## 2020-03-28 ENCOUNTER — Other Ambulatory Visit: Payer: Self-pay

## 2020-03-28 ENCOUNTER — Ambulatory Visit (HOSPITAL_COMMUNITY)
Admission: RE | Admit: 2020-03-28 | Discharge: 2020-03-28 | Disposition: A | Discharge status: Home / Self Care | Payer: Medicare Other | Attending: Hematology and Oncology | Admitting: Hematology and Oncology

## 2020-03-28 DIAGNOSIS — C561 Malignant neoplasm of right ovary: Secondary | ICD-10-CM

## 2020-03-28 DIAGNOSIS — Z0189 Encounter for other specified special examinations: Secondary | ICD-10-CM | POA: Diagnosis not present

## 2020-03-28 DIAGNOSIS — I351 Nonrheumatic aortic (valve) insufficiency: Secondary | ICD-10-CM | POA: Insufficient documentation

## 2020-03-28 LAB — ECHOCARDIOGRAM COMPLETE
Area-P 1/2: 3.6 cm2
P 1/2 time: 497 msec
S' Lateral: 3.1 cm

## 2020-03-28 NOTE — Telephone Encounter (Signed)
Scheduled appt per 9/28 sch msg - pt aware of appt on 10/5

## 2020-03-28 NOTE — Progress Notes (Signed)
  Echocardiogram 2D Echocardiogram has been performed.  Jennette Dubin 03/28/2020, 10:40 AM

## 2020-04-01 NOTE — Progress Notes (Signed)
Pharmacist Chemotherapy Monitoring - Initial Assessment    Anticipated start date: 04/02/20   Regimen:   Are orders appropriate based on the patients diagnosis, regimen, and cycle? Yes  Does the plan date match the patients scheduled date? Yes  Is the sequencing of drugs appropriate? Yes  Are the premedications appropriate for the patients regimen? Yes  Prior Authorization for treatment is: Approved o If applicable, is the correct biosimilar selected based on the patient's insurance? not applicable  Organ Function and Labs:  Are dose adjustments needed based on the patient's renal function, hepatic function, or hematologic function? Yes  Are appropriate labs ordered prior to the start of patient's treatment? Yes  Other organ system assessment, if indicated: doxorubicin liposomal: Echo/ MUGA  The following baseline labs, if indicated, have been ordered: N/A  Dose Assessment:  Are the drug doses appropriate? Yes  Are the following correct: o Drug concentrations Yes o IV fluid compatible with drug Yes o Administration routes Yes o Timing of therapy Yes  If applicable, does the patient have documented access for treatment and/or plans for port-a-cath placement? yes  If applicable, have lifetime cumulative doses been properly documented and assessed? yes Lifetime Dose Tracking   Carboplatin: 5,410 mg = 0.01 % of the maximum lifetime dose of 999,999,999 mg  o   Toxicity Monitoring/Prevention:  The patient has the following take home antiemetics prescribed: Ondansetron and Prochlorperazine  The patient has the following take home medications prescribed: N/A  Medication allergies and previous infusion related reactions, if applicable, have been reviewed and addressed. Yes  The patient's current medication list has been assessed for drug-drug interactions with their chemotherapy regimen. no significant drug-drug interactions were identified on review.  Order  Review:  Are the treatment plan orders signed? Yes  Is the patient scheduled to see a provider prior to their treatment? Yes  I verify that I have reviewed each item in the above checklist and answered each question accordingly.   Kennith Center, Pharm.D., CPP 04/01/2020@3 :26 PM

## 2020-04-02 ENCOUNTER — Inpatient Hospital Stay: Payer: Medicare Other

## 2020-04-02 ENCOUNTER — Other Ambulatory Visit: Payer: Self-pay

## 2020-04-02 ENCOUNTER — Inpatient Hospital Stay (HOSPITAL_BASED_OUTPATIENT_CLINIC_OR_DEPARTMENT_OTHER): Payer: Medicare Other | Admitting: Hematology and Oncology

## 2020-04-02 ENCOUNTER — Inpatient Hospital Stay: Payer: Medicare Other | Attending: Gynecologic Oncology

## 2020-04-02 DIAGNOSIS — Z7189 Other specified counseling: Secondary | ICD-10-CM

## 2020-04-02 DIAGNOSIS — N390 Urinary tract infection, site not specified: Secondary | ICD-10-CM

## 2020-04-02 DIAGNOSIS — Z5111 Encounter for antineoplastic chemotherapy: Secondary | ICD-10-CM | POA: Insufficient documentation

## 2020-04-02 DIAGNOSIS — Z0001 Encounter for general adult medical examination with abnormal findings: Secondary | ICD-10-CM | POA: Diagnosis not present

## 2020-04-02 DIAGNOSIS — C561 Malignant neoplasm of right ovary: Secondary | ICD-10-CM | POA: Diagnosis present

## 2020-04-02 DIAGNOSIS — I10 Essential (primary) hypertension: Secondary | ICD-10-CM | POA: Diagnosis not present

## 2020-04-02 DIAGNOSIS — Z87440 Personal history of urinary (tract) infections: Secondary | ICD-10-CM | POA: Insufficient documentation

## 2020-04-02 DIAGNOSIS — R300 Dysuria: Secondary | ICD-10-CM | POA: Diagnosis not present

## 2020-04-02 DIAGNOSIS — L651 Anagen effluvium: Secondary | ICD-10-CM | POA: Diagnosis not present

## 2020-04-02 DIAGNOSIS — Z23 Encounter for immunization: Secondary | ICD-10-CM | POA: Diagnosis not present

## 2020-04-02 DIAGNOSIS — G459 Transient cerebral ischemic attack, unspecified: Secondary | ICD-10-CM | POA: Diagnosis not present

## 2020-04-02 DIAGNOSIS — C569 Malignant neoplasm of unspecified ovary: Secondary | ICD-10-CM

## 2020-04-02 LAB — CBC WITH DIFFERENTIAL (CANCER CENTER ONLY)
Abs Immature Granulocytes: 0.01 10*3/uL (ref 0.00–0.07)
Basophils Absolute: 0 10*3/uL (ref 0.0–0.1)
Basophils Relative: 1 %
Eosinophils Absolute: 0.1 10*3/uL (ref 0.0–0.5)
Eosinophils Relative: 1 %
HCT: 37.5 % (ref 36.0–46.0)
Hemoglobin: 12.4 g/dL (ref 12.0–15.0)
Immature Granulocytes: 0 %
Lymphocytes Relative: 46 %
Lymphs Abs: 2.2 10*3/uL (ref 0.7–4.0)
MCH: 32 pg (ref 26.0–34.0)
MCHC: 33.1 g/dL (ref 30.0–36.0)
MCV: 96.9 fL (ref 80.0–100.0)
Monocytes Absolute: 0.6 10*3/uL (ref 0.1–1.0)
Monocytes Relative: 11 %
Neutro Abs: 2 10*3/uL (ref 1.7–7.7)
Neutrophils Relative %: 41 %
Platelet Count: 265 10*3/uL (ref 150–400)
RBC: 3.87 MIL/uL (ref 3.87–5.11)
RDW: 13.4 % (ref 11.5–15.5)
WBC Count: 4.8 10*3/uL (ref 4.0–10.5)
nRBC: 0 % (ref 0.0–0.2)

## 2020-04-02 LAB — CMP (CANCER CENTER ONLY)
ALT: 16 U/L (ref 0–44)
AST: 17 U/L (ref 15–41)
Albumin: 3.7 g/dL (ref 3.5–5.0)
Alkaline Phosphatase: 88 U/L (ref 38–126)
Anion gap: 6 (ref 5–15)
BUN: 17 mg/dL (ref 8–23)
CO2: 29 mmol/L (ref 22–32)
Calcium: 9.9 mg/dL (ref 8.9–10.3)
Chloride: 103 mmol/L (ref 98–111)
Creatinine: 0.86 mg/dL (ref 0.44–1.00)
GFR, Estimated: >60 mL/min (ref >60)
Glucose, Bld: 91 mg/dL (ref 70–99)
Potassium: 3.9 mmol/L (ref 3.5–5.1)
Sodium: 138 mmol/L (ref 135–145)
Total Bilirubin: <0.2 mg/dL — ABNORMAL LOW (ref 0.3–1.2)
Total Protein: 7.1 g/dL (ref 6.5–8.1)

## 2020-04-02 MED ORDER — DIPHENHYDRAMINE HCL 50 MG/ML IJ SOLN
INTRAMUSCULAR | Status: AC
  Filled 2020-04-02: qty 1

## 2020-04-02 MED ORDER — HEPARIN SOD (PORK) LOCK FLUSH 100 UNIT/ML IV SOLN
500.0000 [IU] | Freq: Once | INTRAVENOUS | Status: AC | PRN
  Administered 2020-04-02: 500 [IU]
  Filled 2020-04-02: qty 5

## 2020-04-02 MED ORDER — PALONOSETRON HCL INJECTION 0.25 MG/5ML
0.2500 mg | Freq: Once | INTRAVENOUS | Status: AC
  Administered 2020-04-02: 0.25 mg via INTRAVENOUS

## 2020-04-02 MED ORDER — DEXTROSE 5 % IV SOLN
Freq: Once | INTRAVENOUS | Status: AC
  Filled 2020-04-02: qty 250

## 2020-04-02 MED ORDER — SODIUM CHLORIDE 0.9% FLUSH
10.0000 mL | INTRAVENOUS | Status: DC | PRN
  Administered 2020-04-02: 10 mL
  Filled 2020-04-02: qty 10

## 2020-04-02 MED ORDER — FAMOTIDINE IN NACL 20-0.9 MG/50ML-% IV SOLN
20.0000 mg | Freq: Once | INTRAVENOUS | Status: AC
  Administered 2020-04-02: 20 mg via INTRAVENOUS

## 2020-04-02 MED ORDER — SODIUM CHLORIDE 0.9 % IV SOLN
150.0000 mg | Freq: Once | INTRAVENOUS | Status: AC
  Administered 2020-04-02: 150 mg via INTRAVENOUS
  Filled 2020-04-02: qty 150

## 2020-04-02 MED ORDER — DIPHENHYDRAMINE HCL 50 MG/ML IJ SOLN
25.0000 mg | Freq: Once | INTRAMUSCULAR | Status: AC
  Administered 2020-04-02: 25 mg via INTRAVENOUS

## 2020-04-02 MED ORDER — FAMOTIDINE IN NACL 20-0.9 MG/50ML-% IV SOLN
INTRAVENOUS | Status: AC
  Filled 2020-04-02: qty 50

## 2020-04-02 MED ORDER — DOXORUBICIN HCL LIPOSOMAL CHEMO INJECTION 2 MG/ML
28.4000 mg/m2 | Freq: Once | INTRAVENOUS | Status: AC
  Administered 2020-04-02: 60 mg via INTRAVENOUS
  Filled 2020-04-02: qty 30

## 2020-04-02 MED ORDER — SODIUM CHLORIDE 0.9 % IV SOLN
10.0000 mg | Freq: Once | INTRAVENOUS | Status: AC
  Administered 2020-04-02: 10 mg via INTRAVENOUS
  Filled 2020-04-02: qty 10

## 2020-04-02 MED ORDER — SODIUM CHLORIDE 0.9% FLUSH
10.0000 mL | Freq: Once | INTRAVENOUS | Status: AC
  Administered 2020-04-02: 10 mL
  Filled 2020-04-02: qty 10

## 2020-04-02 MED ORDER — PALONOSETRON HCL INJECTION 0.25 MG/5ML
INTRAVENOUS | Status: AC
  Filled 2020-04-02: qty 5

## 2020-04-02 MED ORDER — SODIUM CHLORIDE 0.9 % IV SOLN
490.5000 mg | Freq: Once | INTRAVENOUS | Status: AC
  Administered 2020-04-02: 490 mg via INTRAVENOUS
  Filled 2020-04-02: qty 49

## 2020-04-02 NOTE — Patient Instructions (Signed)
[facility] Discharge Instructions for Patients Receiving Chemotherapy  Today you received the following chemotherapy agents: doxorubicin liposomal and carboplatin.  To help prevent nausea and vomiting after your treatment, we encourage you to take your nausea medication as directed.   If you develop nausea and vomiting that is not controlled by your nausea medication, call the clinic.   BELOW ARE SYMPTOMS THAT SHOULD BE REPORTED IMMEDIATELY:  *FEVER GREATER THAN 100.5 F  *CHILLS WITH OR WITHOUT FEVER  NAUSEA AND VOMITING THAT IS NOT CONTROLLED WITH YOUR NAUSEA MEDICATION  *UNUSUAL SHORTNESS OF BREATH  *UNUSUAL BRUISING OR BLEEDING  TENDERNESS IN MOUTH AND THROAT WITH OR WITHOUT PRESENCE OF ULCERS  *URINARY PROBLEMS  *BOWEL PROBLEMS  UNUSUAL RASH Items with * indicate a potential emergency and should be followed up as soon as possible.  Feel free to call the clinic should you have any questions or concerns. The clinic phone number is [phone].  Please show the CHEMO ALERT CARD at check-in to the Emergency Department and triage nurse.  Doxorubicin Liposomal injection What is this medicine? LIPOSOMAL DOXORUBICIN (LIP oh som al dox oh ROO bi sin) is a chemotherapy drug. This medicine is used to treat many kinds of cancer like Kaposi's sarcoma, multiple myeloma, and ovarian cancer. This medicine may be used for other purposes; ask your health care provider or pharmacist if you have questions. COMMON BRAND NAME(S): Doxil, Lipodox What should I tell my health care provider before I take this medicine? They need to know if you have any of these conditions:  blood disorders  heart disease  infection (especially a virus infection such as chickenpox, cold sores, or herpes)  liver disease  recent or ongoing radiation therapy  an unusual or allergic reaction to doxorubicin, other chemotherapy agents, soybeans, other medicines, foods, dyes, or  preservatives  pregnant or trying to get pregnant  breast-feeding How should I use this medicine? This drug is given as an infusion into a vein. It is administered in a hospital or clinic by a specially trained health care professional. If you have pain, swelling, burning or any unusual feeling around the site of your injection, tell your health care professional right away. Talk to your pediatrician regarding the use of this medicine in children. Special care may be needed. Overdosage: If you think you have taken too much of this medicine contact a poison control center or emergency room at once. NOTE: This medicine is only for you. Do not share this medicine with others. What if I miss a dose? It is important not to miss your dose. Call your doctor or health care professional if you are unable to keep an appointment. What may interact with this medicine? Do not take this medicine with any of the following medications:  zidovudine This medicine may also interact with the following medications:  medicines to increase blood counts like filgrastim, pegfilgrastim, sargramostim  vaccines Talk to your doctor or health care professional before taking any of these medicines:  acetaminophen  aspirin  ibuprofen  ketoprofen  naproxen This list may not describe all possible interactions. Give your health care provider a list of all the medicines, herbs, non-prescription drugs, or dietary supplements you use. Also tell them if you smoke, drink alcohol, or use illegal drugs. Some items may interact with your medicine. What should I watch for while using this medicine? Your condition will be monitored carefully while you are receiving this medicine. You may need blood work done while you are   taking this medicine. This drug may make you feel generally unwell. This is not uncommon, as chemotherapy can affect healthy cells as well as cancer cells. Report any side effects. Continue your course of  treatment even though you feel ill unless your doctor tells you to stop. Your urine may turn orange-red for a few days after your dose. This is not blood. If your urine is dark or brown, call your doctor. In some cases, you may be given additional medicines to help with side effects. Follow all directions for their use. Talk to your doctor about your risk of cancer. You may be more at risk for certain types of cancers if you take this medicine. Do not become pregnant while taking this medicine or for 6 months after stopping it. Women should inform their healthcare professional if they wish to become pregnant or think they may be pregnant. Men should not father a child while taking this medicine and for 6 months after stopping it. There is a potential for serious side effects to an unborn child. Talk to your health care professional or pharmacist for more information. Do not breast-feed an infant while taking this medicine. This medicine has caused ovarian failure in some women. This medicine may make it more difficult to get pregnant. Talk to your healthcare professional if you are concerned about your fertility. This medicine has caused decreased sperm counts in some men. This may make it more difficult to father a child. Talk to your healthcare professional if you are concerned about your fertility. This medicine may cause a decrease in Co-Enzyme Q-10. You should make sure that you get enough Co-Enzyme Q-10 while you are taking this medicine. Discuss the foods you eat and the vitamins you take with your health care professional. What side effects may I notice from receiving this medicine? Side effects that you should report to your doctor or health care professional as soon as possible:  allergic reactions like skin rash, itching or hives, swelling of the face, lips, or tongue  low blood counts - this medicine may decrease the number of white blood cells, red blood cells and platelets. You may be at  increased risk for infections and bleeding.  signs of hand-foot syndrome - tingling or burning, redness, flaking, swelling, small blisters, or small sores on the palms of your hands or the soles of your feet  signs of infection - fever or chills, cough, sore throat, pain or difficulty passing urine  signs of decreased platelets or bleeding - bruising, pinpoint red spots on the skin, black, tarry stools, blood in the urine  signs of decreased red blood cells - unusually weak or tired, fainting spells, lightheadedness  back pain, chills, facial flushing, fever, headache, tightness in the chest or throat during the infusion  breathing problems  chest pain  fast, irregular heartbeat  mouth pain, redness, sores  pain, swelling, redness at site where injected  pain, tingling, numbness in the hands or feet  swelling of ankles, feet, or hands  vomiting Side effects that usually do not require medical attention (report to your doctor or health care professional if they continue or are bothersome):  diarrhea  hair loss  loss of appetite  nail discoloration or damage  nausea  red or watery eyes  red colored urine  stomach upset This list may not describe all possible side effects. Call your doctor for medical advice about side effects. You may report side effects to FDA at 1-800-FDA-1088. Where should I keep  my medicine? This drug is given in a hospital or clinic and will not be stored at home. NOTE: This sheet is a summary. It may not cover all possible information. If you have questions about this medicine, talk to your doctor, pharmacist, or health care provider.  2020 [employer]vier/Gold Standard (2018-02-21 15:13:26)

## 2020-04-02 NOTE — Patient Instructions (Signed)

## 2020-04-03 ENCOUNTER — Encounter: Payer: Self-pay | Admitting: Hematology and Oncology

## 2020-04-03 DIAGNOSIS — N390 Urinary tract infection, site not specified: Secondary | ICD-10-CM | POA: Insufficient documentation

## 2020-04-03 LAB — CA 125: Cancer Antigen (CA) 125: 304 U/mL — ABNORMAL HIGH (ref 0.0–38.1)

## 2020-04-03 NOTE — Assessment & Plan Note (Signed)
She is on antibiotics I do not feel strongly we should delay chemo She is wondering whether her pessary is the cause of recurrent UTI I will reach out to her GYN surgeon about this

## 2020-04-03 NOTE — Progress Notes (Signed)
[facility] OFFICE PROGRESS NOTE  Patient Care Team: Caryl Bis, MD as PCP - General (Family Medicine) Awanda Mink Craige Cotta, RN as Oncology Nurse Navigator (Oncology)  ASSESSMENT & PLAN:  Right ovarian epithelial cancer [facility]) I reviewed plan of care with patient and family over the phone Her recent echo result is within normal limits We reviewed the current guidelines and the final recommendations for chemo The recommendation is based on publication below:  Story (2012) 107, [phone] & 2012 [employer] [place] All rights reserved 0007 - 0920/12  Final overall survival results of phase III [facility] CALYPSO trial of pegylated liposomal doxorubicin and carboplatin vs paclitaxel and carboplatin in platinum-sensitive ovarian cancer patients  BACKGROUND: The CALYPSO phase III trial compared CD (carboplatin-pegylated liposomal doxorubicin (PLD)) with CP (carboplatinpaclitaxel) in patients with platinum-sensitive recurrent ovarian cancer (ROC). Overall survival (OS) data are now mature. METHODS: Women with ROC relapsing 46 months after first- or second-line therapy were randomised to CD or CP for six cycles in this international, open-label, non-inferiority trial. The primary endpoint was progression-free survival. The OS analysis is presented here. RESULTS: A total of 976 patients were randomised (467 to CD and 509 to CP). With a median follow-up of 49 months, no statistically significant difference was observed between arms in OS (hazard ratio0.99 (95% confidence interval 0.85, 1.16); log-rank P0.94). Median survival times were 30.7 months (CD) and 33.0 months (CP). No statistically significant difference in OS was observed between arms in predetermined subgroups according to age, body mass index, treatment-free interval, measurable disease, number of lines of prior chemotherapy, or performance status. Post-study cross-over was imbalanced between arms, with a greater  proportion of patients randomised to CP receiving post-study PLD (68%) than patients randomised to CD receiving post-study paclitaxel (43%; Po0.001). CONCLUSION: Carboplatin-PLD led to delayed progression and similar OS compared with carboplatin-paclitaxel in platinum-sensitive ROC.  We discussed the role of chemotherapy. The intent is of palliative intent.  We discussed some of the risks, benefits, side-effects of carboplatin and liposomal doxorubicin  Some of the short term side-effects included, though not limited to, including weight loss, life threatening infections, risk of allergic reactions, need for transfusions of blood products, nausea, vomiting, change in bowel habits, loss of hair, risk of congestive heart failure, admission to hospital for various reasons, and risks of death.   Long term side-effects are also discussed including risks of infertility, permanent damage to nerve function, hearing loss, chronic fatigue, kidney damage with possibility needing hemodialysis, and rare secondary malignancy including bone marrow disorders.  The patient is aware that the response rates discussed earlier is not guaranteed.  After a long discussion, patient made an informed decision to proceed with the prescribed plan of care.  We discussed the use of cardiac imaging before and during treatment and close monitoring for signs of heart failure while on treatment I recommend 3 cycles of treatment before repeating imaging study   Recurrent UTI She is on antibiotics I do not feel strongly we should delay chemo She is wondering whether her pessary is the cause of recurrent UTI I will reach out to her GYN surgeon about this   No orders of the defined types were placed in this encounter.   All questions were answered. The patient knows to call the clinic with any problems, questions or concerns. The total time spent in the appointment was 20 minutes encounter with patients including review of  chart and various tests results, discussions about plan of care and  coordination of care plan   [name], MD 04/03/2020 7:31 AM  INTERVAL HISTORY: Please see below for problem oriented charting. 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  She has bloating and early satiety. Shortness of breath with walking. She denied bleeding. She reported constipation with pain with defecation and narrowed stools   11/29/2018 Imaging   1. 13 cm complex cystic lesion in the central pelvis, highly suspicious for ovarian cystadenocarcinoma. 2. Diffuse peritoneal carcinomatosis with mild ascites. 3. Mild lymphadenopathy in porta hepatis and right cardiophrenic angle, suspicious for metastatic disease. 4. Moderate right and tiny left pleural effusions   12/05/2018 Tumor Marker   Patient's tumor was tested for the following markers: CA-125 Results of the tumor marker test revealed 1015.   12/06/2018 Cancer Staging   Staging form: Ovary, Fallopian Tube, and Primary Peritoneal Carcinoma, AJCC 8th Edition - Clinical: FIGO Stage IVA, calculated as Stage IV (cT3c, cN1, cM1) - Signed by [name], MD on 12/06/2018   12/09/2018 Pathology Results   PLEURAL FLUID, RIGHT (SPECIMEN 1 OF 1 COLLECTED 12/09/18): - MALIGNANT CELLS CONSISTENT WITH METASTATIC ADENOCARCINOMA - SEE COMMENT Comment The neoplastic cells are positive for cytokeratin 7 and Pax-8 but negative for cytokeratin 20, TTF-1, CDX-2 and Gata-3. Overall, the phenotype is consistent with the clinical impression of gynecologic primary.    12/15/2018 Procedure   Placement of single lumen port a cath via right internal jugular vein. The catheter tip lies at the  cavo-atrial junction. A power injectable port a cath was placed and is ready for immediate use   12/16/2018 - 08/24/2019 Chemotherapy   The patient had carboplatin and taxol for chemotherapy treatment.  She had 7 cycles given neoadjuvant prior to surgery and 3 more cycles after surgery, for a total of 10 cycles of treatment    01/06/2019 Tumor Marker   Patient's tumor was tested for the following markers: CA-125 Results of the tumor marker test revealed 948   02/02/2019 Imaging   1. Massive pulmonary embolism, as discussed above. Given the mildly elevated RV to LV ratio of 0.95, this is associated with increased risk of morbidity and mortality. 2. Today's study demonstrates a mixed response to therapy. Specifically, while there has been regression of the bulky intraperitoneal metastatic disease and regression of previously noted pleural effusions, the large cystic mass in the central pelvis has increased in size compared to the prior study. 3. New right mild hydroureteronephrosis related to extrinsic compression on the distal third of the right ureter by the patient's large pelvic mass. 4. Scattered small pulmonary nodules (predominantly pleural based) appear stable compared to prior examinations. These are nonspecific but warrant continued attention on follow-up studies. 5. Aortic atherosclerosis, in addition to three-vessel coronary artery disease. Assessment for potential risk factor modification, dietary therapy or pharmacologic therapy may be warranted, if clinically indicated.   02/02/2019 - 02/04/2019 Hospital Admission   She was admitted the hospital due to DVT and PE   02/03/2019 Imaging   Bilateral venous Doppler US Right: Findings consistent with acute deep vein thrombosis involving the right femoral vein, right popliteal vein, right peroneal veins, right soleal veins, and right gastrocnemius veins. No cystic structure found in the popliteal fossa. Left: There  is no evidence of deep vein  thrombosis in the lower extremity. No cystic structure found in the popliteal fossa   02/17/2019 Tumor Marker   Patient's tumor was tested for the following markers: CA-125 Results of the tumor marker test revealed 127   03/10/2019 Tumor Marker   Patient's tumor was tested for the following markers: CA-125. Results of the tumor marker test revealed 87.4   03/14/2019 - 03/16/2019 Hospital Admission   She was admitted to the hospital recently for weakness   03/31/2019 Tumor Marker   Patient's tumor was tested for the following markers: CA-125 Results of the tumor marker test revealed 59.2.   04/28/2019 Tumor Marker   Patient's tumor was tested for the following markers: CA-125 Results of the tumor marker test revealed 56.8   05/05/2019 Imaging   1. Interval decrease in size of the large cystic mass in the central pelvis. Mesenteric and omental soft tissue disease shows no substantial interval change. 2. The mild right hydroureteronephrosis seen previously has resolved in the interval. 3. Small residual nonobstructive thrombus identified in the inter lobar pulmonary artery common here into the lateral wall compatible with chronicity. 4. 14 mm subtle enhancing lesion in the anterior right liver is stable. This may be vascular malformation. Attention on follow-up recommended. 5. Stable appearance of the multiple small bilateral pulmonary nodules. Continued attention on follow-up recommended. 6.  Aortic Atherosclerois (ICD10-170.0)     06/06/2019 Pathology Results   A. OMENTUM, RESECTION: - Metastatic carcinoma. B. RIGHT FALLOPIAN TUBE AND OVARY, SALPINGOOOPHORECTOMY: - High-grade serous carcinoma, spanning 9 cm. - No surface involvement identified. - Fallopian tube involved by carcinoma. - See oncology table. C. PERITONEAL NODULE, EXCISION: - Metastatic carcinoma. ONCOLOGY TABLE: OVARY or FALLOPIAN TUBE or PRIMARY PERITONEUM: Procedure: Right salpingo-oophorectomy, omental resection,  and peritoneal biopsy. Specimen Integrity: Intact Tumor Site: Right ovary Ovarian Surface Involvement (required only if applicable): Not identified Tumor Size: 9 cm Histologic Type: High-grade serous carcinoma Histologic Grade: High-grade Implants (required for advanced stage serous/seromucinous borderline tumors only): Omentum, peritoneum. Other Tissue/ Organ Involvement: Right fallopian tube Largest Extrapelvic Peritoneal Focus (required only if applicable): 7.3 cm Peritoneal/Ascitic Fluid: Positive pleural fluid pre neoadjuvant therapy. Treatment Effect (required only for high-grade serous carcinomas): Probable treatment effect present. Regional Lymph Nodes: No lymph nodes submitted or found Pathologic Stage Classification (pTNM, AJCC 8th Edition): ypT3c, ypNX Representative Tumor Block: B2   06/06/2019 Surgery   Preoperative Diagnosis: stage IV ovarian cancer, s/p neoadjuvant chemotherapy, history of recent PE.  Procedure(s) Performed: Exploratory laparotomy with right salpingo-oophorectomy, omentectomy radical tumor debulking for ovarian cancer .   Surgeon: Thereasa Solo, MD.    Operative Findings:  Omental cake adherent to anterior abdominal wall and hepatic flexure. 10cm right tube and ovary. Surgically absent uterus and left tube and ovary. Granular nodularity across right diaphragm.    This represented an optimal cytoreduction (R0) with no gross visible disease remaining.    07/06/2019 Tumor Marker   Patient's tumor was tested for the following markers:CA-125 Results of the tumor marker test revealed 16.4   08/10/2019 Genetic Testing   Negative genetic testing. No pathogenic variants identified. VUS in BRCA2 called [id number] identified on the [employer] CancerNext+RNAinsight panel. The report date is 08/10/2019.  The CancerNext+RNAinsight gene panel offered by [facility] includes sequencing and rearrangement analysis for the following 36 genes: APC*, ATM*, AXIN2, BARD1, BMPR1A,  BRCA1*, BRCA2*, BRIP1*, CDH1*, CDK4, CDKN2A, CHEK2*, DICER1, MLH1*, MSH2*, MSH3, MSH6*, MUTYH*, NBN, NF1*, NTHL1, PALB2*, PMS2*, PTEN*, RAD51C*, RAD51D*, RECQL, SMAD4,  SMARCA4, STK11 and TP53* (sequencing and deletion/duplication); HOXB13, POLD1 and POLE (sequencing only); EPCAM and GREM1 (deletion/duplication only). DNA and RNA analyses performed for * genes.   HRD (somatic) testing was initially ordered and was not completed due to insufficient amount of tumor sample.    08/24/2019 Tumor Marker   Patient's tumor was tested for the following markers: CA-125 Results of the tumor marker test revealed 12.2   09/20/2019 Imaging   1. Interval debulking, bilateral salpingo oophorectomy and omentectomy with resection of the dominant pelvic cystic lesion and the bandlike soft tissue seen previously in the right mesentery/omentum. Today's study is status shows new postoperative baseline for follow-up. 2. 6 mm gastrohepatic ligament nodule seen on the previous study is stable. Continued attention on follow-up recommended. 3. No new or progressive findings in the chest, abdomen, or pelvis to suggest disease progression. 4. Stable 1.4 cm focus of homogeneous enhancement in the anterior right liver. Stable since at least 11/29/2018 and likely benign. Continued attention on follow-up recommended. 5. Aortic Atherosclerosis (ICD10-I70.0).   09/20/2019 Tumor Marker   Patient's tumor was tested for the following markers: CA-125 Results of the tumor marker test revealed 11.9   12/26/2019 Tumor Marker   Patient's tumor was tested for the following markers: CA-125 Results of the tumor marker test revealed 16.5   03/25/2020 Imaging   1. Interval progression of peritoneal disease which predominantly involves the serosal surface of the proximal and distal transverse colon, and descending colon. 2. New bilateral inguinal adenopathy. 3. No ascites. 4. Aortic atherosclerosis.   03/25/2020 Tumor Marker   Patient's  tumor was tested for the following markers: CA-125 Results of the tumor marker test revealed 249   03/28/2020 Echocardiogram    1. Borderline LV Strain; A2C view represents the most accurate acquisition.. Left ventricular ejection fraction, by estimation, is 60 to 65%. Left ventricular ejection fraction by PLAX is 63 %. The left ventricle has normal function. The left ventricle demonstrates regional wall motion abnormalities (see scoring diagram/findings for description). Left ventricular diastolic parameters were normal.  2. Right ventricular systolic function is normal. The right ventricular size is normal.  3. Left atrial size was mildly dilated.  4. The mitral valve is normal in structure. No evidence of mitral valve regurgitation.  5. The aortic valve is grossly normal. Aortic valve regurgitation is mild.  6. The inferior vena cava is normal in size with greater than 50% respiratory variability, suggesting right atrial pressure of 3 mmHg.     REVIEW OF SYSTEMS:   Constitutional: Denies fevers, chills or abnormal weight loss Eyes: Denies blurriness of vision Ears, nose, mouth, throat, and face: Denies mucositis or sore throat Respiratory: Denies cough, dyspnea or wheezes Cardiovascular: Denies palpitation, chest discomfort or lower extremity swelling Gastrointestinal:  Denies nausea, heartburn or change in bowel habits Skin: Denies abnormal skin rashes Lymphatics: Denies new lymphadenopathy or easy bruising Neurological:Denies numbness, tingling or new weaknesses Behavioral/Psych: Mood is stable, no new changes  All other systems were reviewed with the patient and are negative.  I have reviewed the past medical history, past surgical history, social history and family history with the patient and they are unchanged from previous note.  ALLERGIES:  has No Known Allergies.  MEDICATIONS:  Current Outpatient Medications  Medication Sig Dispense Refill  . aspirin EC 81 MG tablet Take  81 mg by mouth daily. Swallow whole.    Marland Kitchen acetaminophen (TYLENOL) 325 MG tablet Take 650 mg by mouth every 6 (six) hours as needed for  moderate pain or headache.     . celecoxib (CELEBREX) 200 MG capsule celecoxib 200 mg capsule    . esomeprazole (NEXIUM) 40 MG capsule Take 40 mg by mouth 2 (two) times daily.     Marland Kitchen levothyroxine (SYNTHROID) 50 MCG tablet TAKE ONE TABLET EVERY MORNING (Patient taking differently: Take 50 mcg by mouth daily before breakfast. ) 30 tablet 9  . lidocaine-prilocaine (EMLA) cream Apply to affected area once (Patient taking differently: Apply 1 application topically daily as needed (port access). ) 30 g 3  . losartan (COZAAR) 50 MG tablet Take 50 mg by mouth daily.    . Melatonin 3 MG TABS Take by mouth.    . ondansetron (ZOFRAN) 8 MG tablet Take 8 mg by mouth every 8 (eight) hours as needed.    . prochlorperazine (COMPAZINE) 10 MG tablet Take 10 mg by mouth every 6 (six) hours as needed.    . SENEXON-S 8.6-50 MG tablet TAKE 1 TO 2 TABLETS AT BEDTIME FOR AFTER SURGERY (DO NOT TAKE IF HAVING DIARRHEA) 30 tablet 0   No current facility-administered medications for this visit.    PHYSICAL EXAMINATION: ECOG PERFORMANCE STATUS: 0 - Asymptomatic  Vitals:   04/02/20 1207  BP: (!) 146/64  Pulse: 66  Resp: 18  Temp: 97.8 F (36.6 C)  SpO2: 99%   Filed Weights   04/02/20 1207  Weight: 213 lb 9.6 oz (96.9 kg)    GENERAL:alert, no distress and comfortable Musculoskeletal:no cyanosis of digits and no clubbing  NEURO: alert & oriented x 3 with fluent speech, no focal motor/sensory deficits  LABORATORY DATA:  I have reviewed the data as listed    Component Value Date/Time   NA 138 04/02/2020 1318   K 3.9 04/02/2020 1318   CL 103 04/02/2020 1318   CO2 29 04/02/2020 1318   GLUCOSE 91 04/02/2020 1318   BUN 17 04/02/2020 1318   CREATININE 0.86 04/02/2020 1318   CALCIUM 9.9 04/02/2020 1318   PROT 7.1 04/02/2020 1318   ALBUMIN 3.7 04/02/2020 1318   AST 17  04/02/2020 1318   ALT 16 04/02/2020 1318   ALKPHOS 88 04/02/2020 1318   BILITOT <0.2 (L) 04/02/2020 1318   GFRNONAA >60 04/02/2020 1318   GFRAA >60 03/25/2020 1144    No results found for: SPEP, UPEP  Lab Results  Component Value Date   WBC 4.8 04/02/2020   NEUTROABS 2.0 04/02/2020   HGB 12.4 04/02/2020   HCT 37.5 04/02/2020   MCV 96.9 04/02/2020   PLT 265 04/02/2020      Chemistry      Component Value Date/Time   NA 138 04/02/2020 1318   K 3.9 04/02/2020 1318   CL 103 04/02/2020 1318   CO2 29 04/02/2020 1318   BUN 17 04/02/2020 1318   CREATININE 0.86 04/02/2020 1318      Component Value Date/Time   CALCIUM 9.9 04/02/2020 1318   ALKPHOS 88 04/02/2020 1318   AST 17 04/02/2020 1318   ALT 16 04/02/2020 1318   BILITOT <0.2 (L) 04/02/2020 1318       RADIOGRAPHIC STUDIES: I have personally reviewed the radiological images as listed and agreed with the findings in the report. CT ABDOMEN PELVIS W CONTRAST  Result Date: 03/25/2020 CLINICAL DATA:  Restaging ovarian cancer. Follow-up peritoneal nodule. EXAM: CT ABDOMEN AND PELVIS WITH CONTRAST TECHNIQUE: Multidetector CT imaging of the abdomen and pelvis was performed using the standard protocol following bolus administration of intravenous contrast. CONTRAST:  116m OMNIPAQUE IOHEXOL 300  MG/ML  SOLN COMPARISON:  09/20/2019 FINDINGS: Lower chest: No acute abnormality. Hepatobiliary: No focal liver abnormality identified. No gallstones or signs of gallbladder wall thickening or inflammation. No biliary ductal dilatation. Pancreas: Unremarkable. No pancreatic ductal dilatation or surrounding inflammatory changes. Spleen: Normal in size without focal abnormality. Adrenals/Urinary Tract: Normal adrenal glands. No kidney mass or hydronephrosis identified. Urinary bladder is unremarkable. Stomach/Bowel: The stomach is nondistended. There is no small bowel wall thickening, inflammation or distension. No pathologic dilatation of the colon.  Asymmetric wall thickening is noted along the anterior wall of the proximal transverse colon which appears progressive from previous exam, image 53/2 and is suspicious for serosal disease. There is a new serosal implant along the distal transverse colon measuring 1.3 cm in thickness, new from previous exam. Lastly, there is progressive serosal disease involving the descending colon, image 41/4 and image 39/4. This is new from previous exam. No pathologic dilatation of the bowel loops. Vascular/Lymphatic: Aortic atherosclerosis. No aneurysm. No retroperitoneal or mesenteric adenopathy. No iliac adenopathy. Left inguinal lymph node is enlarged measuring 1.6 cm, new from previous exam, image 76/2. Right inguinal lymph node is enlarged measuring 1.3 cm, image 76/2. Also new. Reproductive: Status post hysterectomy. Other: No ascites. Previous gastrocolic ligament nodule measures 8 mm, image 45/2. Previously 6 mm. Soft tissue nodule along the undersurface of the ventral abdominal wall measures 0.9 cm, image 48/2. New from previous exam. Musculoskeletal: No acute or significant osseous findings. Degenerative disc disease noted within the lumbar spine. No acute or suspicious osseous findings. IMPRESSION: 1. Interval progression of peritoneal disease which predominantly involves the serosal surface of the proximal and distal transverse colon, and descending colon. 2. New bilateral inguinal adenopathy. 3. No ascites. 4. Aortic atherosclerosis. Aortic Atherosclerosis (ICD10-I70.0). Electronically Signed   By: Kerby Moors M.D.   On: 03/25/2020 16:16   ECHOCARDIOGRAM COMPLETE  Result Date: 03/28/2020    ECHOCARDIOGRAM REPORT   Patient Name:   Paula Peterson Date of Exam: 03/28/2020 Medical Rec #:  [id number]    Height:       67.0 in Accession #:    [id number]   Weight:       213.4 lb Date of Birth:  08-28-1940   BSA:          2.079 m Patient Age:    79 years     BP:           119/54 mmHg Patient Gender: F            HR:            69 bpm. Exam Location:  Outpatient Procedure: 2D Echo and Strain Analysis Indications:    Chemotherapy Evaluation v58.11  History:        Patient has prior history of Echocardiogram examinations, most                 recent 02/02/2019. Risk Factors:Hypertension.  Sonographer:    Mikki Santee RDCS (AE) Referring Phys: [phone] Sharnese Heath Panora  1. Borderline LV Strain; A2C view represents the most accurate acquisition.. Left ventricular ejection fraction, by estimation, is 60 to 65%. Left ventricular ejection fraction by PLAX is 63 %. The left ventricle has normal function. The left ventricle demonstrates regional wall motion abnormalities (see scoring diagram/findings for description). Left ventricular diastolic parameters were normal.  2. Right ventricular systolic function is normal. The right ventricular size is normal.  3. Left atrial size was mildly dilated.  4. The mitral valve is normal in  structure. No evidence of mitral valve regurgitation.  5. The aortic valve is grossly normal. Aortic valve regurgitation is mild.  6. The inferior vena cava is normal in size with greater than 50% respiratory variability, suggesting right atrial pressure of 3 mmHg. Comparison(s): No significant change from prior study. Difficult strain comparsion to prior study. FINDINGS  Left Ventricle: Borderline LV Strain; A2C view represents the most accurate acquisition. Left ventricular ejection fraction, by estimation, is 60 to 65%. Left ventricular ejection fraction by PLAX is 63 %. The left ventricle has normal function. The left ventricle demonstrates regional wall motion abnormalities. The left ventricular internal cavity size was normal in size. There is no left ventricular hypertrophy. Left ventricular diastolic parameters were normal. Right Ventricle: The right ventricular size is normal. No increase in right ventricular wall thickness. Right ventricular systolic function is normal. Left Atrium: Left atrial size  was mildly dilated. Right Atrium: Right atrial size was normal in size. Pericardium: There is no evidence of pericardial effusion. Mitral Valve: The mitral valve is normal in structure. No evidence of mitral valve regurgitation. Tricuspid Valve: The tricuspid valve is normal in structure. Tricuspid valve regurgitation is not demonstrated. Aortic Valve: The aortic valve is grossly normal. Aortic valve regurgitation is mild. Aortic regurgitation PHT measures 497 msec. Pulmonic Valve: The pulmonic valve was not well visualized. Pulmonic valve regurgitation is not visualized. Aorta: The aortic root and ascending aorta are structurally normal, with no evidence of dilitation. Venous: The inferior vena cava is normal in size with greater than 50% respiratory variability, suggesting right atrial pressure of 3 mmHg. IAS/Shunts: The interatrial septum was not assessed.  LEFT VENTRICLE PLAX 2D LV EF:         Left            Diastology                ventricular     LV e' medial:    5.33 cm/s                ejection        LV E/e' medial:  14.0                fraction by     LV e' lateral:   7.94 cm/s                PLAX is 63      LV E/e' lateral: 9.4                %. LVIDd:         4.70 cm         2D LVIDs:         3.10 cm         Longitudinal LV PW:         1.10 cm         Strain LV IVS:        1.10 cm         2D Strain GLS  -16.1 % LVOT diam:     2.00 cm         (A2C): LV SV:         53 LV SV Index:   25 LVOT Area:     3.14 cm  RIGHT VENTRICLE RV S prime:     9.89 cm/s TAPSE (M-mode): 1.8 cm LEFT ATRIUM             Index  RIGHT ATRIUM          Index LA diam:        3.60 cm 1.73 cm/m  RA Area:     9.03 cm LA Vol (A2C):   70.2 ml 33.77 ml/m RA Volume:   17.60 ml 8.47 ml/m LA Vol (A4C):   34.3 ml 16.50 ml/m LA Biplane Vol: 50.6 ml 24.34 ml/m  AORTIC VALVE LVOT Vmax:   73.10 cm/s LVOT Vmean:  49.300 cm/s LVOT VTI:    0.168 m AI PHT:      497 msec  AORTA Ao Root diam: 3.20 cm MITRAL VALVE MV Area (PHT): 3.60 cm     SHUNTS MV Decel Time: 211 msec    Systemic VTI:  0.17 m MV E velocity: 74.60 cm/s  Systemic Diam: 2.00 cm MV A velocity: 65.60 cm/s MV E/A ratio:  1.14 Rudean Haskell MD Electronically signed by Rudean Haskell MD Signature Date/Time: 03/28/2020/3:05:59 PM    Final

## 2020-04-03 NOTE — Assessment & Plan Note (Signed)
I reviewed plan of care with patient and family over the phone Her recent echo result is within normal limits We reviewed the current guidelines and the final recommendations for chemo The recommendation is based on publication below:  [place] (2012) [phone], [phone] & 2012 [employer] [place] All rights reserved 0007 - 0920/12  Final overall survival results of phase III [facility] CALYPSO trial of pegylated liposomal doxorubicin and carboplatin vs paclitaxel and carboplatin in platinum-sensitive ovarian cancer patients  BACKGROUND: The CALYPSO phase III trial compared CD (carboplatin-pegylated liposomal doxorubicin (PLD)) with CP (carboplatinpaclitaxel) in patients with platinum-sensitive recurrent ovarian cancer (ROC). Overall survival (OS) data are now mature. METHODS: Women with ROC relapsing 46 months after first- or second-line therapy were randomised to CD or CP for six cycles in this international, open-label, non-inferiority trial. The primary endpoint was progression-free survival. The OS analysis is presented here. RESULTS: A total of 976 patients were randomised (467 to CD and 509 to CP). With a median follow-up of 49 months, no statistically significant difference was observed between arms in OS (hazard ratio0.99 (95% confidence interval 0.85, 1.16); log-rank P0.94). Median survival times were 30.7 months (CD) and 33.0 months (CP). No statistically significant difference in OS was observed between arms in predetermined subgroups according to age, body mass index, treatment-free interval, measurable disease, number of lines of prior chemotherapy, or performance status. Post-study cross-over was imbalanced between arms, with a greater proportion of patients randomised to CP receiving post-study PLD (68%) than patients randomised to CD receiving post-study paclitaxel (43%; Po0.001). CONCLUSION: Carboplatin-PLD led to delayed progression and similar OS compared with  carboplatin-paclitaxel in platinum-sensitive ROC.  We discussed the role of chemotherapy. The intent is of palliative intent.  We discussed some of the risks, benefits, side-effects of carboplatin and liposomal doxorubicin  Some of the short term side-effects included, though not limited to, including weight loss, life threatening infections, risk of allergic reactions, need for transfusions of blood products, nausea, vomiting, change in bowel habits, loss of hair, risk of congestive heart failure, admission to hospital for various reasons, and risks of death.   Long term side-effects are also discussed including risks of infertility, permanent damage to nerve function, hearing loss, chronic fatigue, kidney damage with possibility needing hemodialysis, and rare secondary malignancy including bone marrow disorders.  The patient is aware that the response rates discussed earlier is not guaranteed.  After a long discussion, patient made an informed decision to proceed with the prescribed plan of care.  We discussed the use of cardiac imaging before and during treatment and close monitoring for signs of heart failure while on treatment I recommend 3 cycles of treatment before repeating imaging study

## 2020-04-30 ENCOUNTER — Inpatient Hospital Stay: Payer: Medicare Other | Admitting: Hematology and Oncology

## 2020-04-30 ENCOUNTER — Inpatient Hospital Stay: Payer: Medicare Other | Attending: Gynecologic Oncology

## 2020-04-30 ENCOUNTER — Other Ambulatory Visit: Payer: Self-pay

## 2020-04-30 ENCOUNTER — Inpatient Hospital Stay: Payer: Medicare Other

## 2020-04-30 ENCOUNTER — Other Ambulatory Visit: Payer: Self-pay | Admitting: Hematology and Oncology

## 2020-04-30 DIAGNOSIS — C561 Malignant neoplasm of right ovary: Secondary | ICD-10-CM

## 2020-04-30 DIAGNOSIS — D61818 Other pancytopenia: Secondary | ICD-10-CM | POA: Diagnosis not present

## 2020-04-30 DIAGNOSIS — Z5111 Encounter for antineoplastic chemotherapy: Secondary | ICD-10-CM | POA: Insufficient documentation

## 2020-04-30 DIAGNOSIS — R5383 Other fatigue: Secondary | ICD-10-CM | POA: Insufficient documentation

## 2020-04-30 DIAGNOSIS — K5909 Other constipation: Secondary | ICD-10-CM

## 2020-04-30 DIAGNOSIS — R7989 Other specified abnormal findings of blood chemistry: Secondary | ICD-10-CM | POA: Insufficient documentation

## 2020-04-30 DIAGNOSIS — Z7189 Other specified counseling: Secondary | ICD-10-CM

## 2020-04-30 LAB — CMP (CANCER CENTER ONLY)
ALT: 20 U/L (ref 0–44)
AST: 18 U/L (ref 15–41)
Albumin: 3.6 g/dL (ref 3.5–5.0)
Alkaline Phosphatase: 81 U/L (ref 38–126)
Anion gap: 8 (ref 5–15)
BUN: 15 mg/dL (ref 8–23)
CO2: 26 mmol/L (ref 22–32)
Calcium: 9.5 mg/dL (ref 8.9–10.3)
Chloride: 105 mmol/L (ref 98–111)
Creatinine: 1.12 mg/dL — ABNORMAL HIGH (ref 0.44–1.00)
GFR, Estimated: 51 mL/min — ABNORMAL LOW (ref >60)
Glucose, Bld: 111 mg/dL — ABNORMAL HIGH (ref 70–99)
Potassium: 4.1 mmol/L (ref 3.5–5.1)
Sodium: 139 mmol/L (ref 135–145)
Total Bilirubin: <0.2 mg/dL — ABNORMAL LOW (ref 0.3–1.2)
Total Protein: 6.8 g/dL (ref 6.5–8.1)

## 2020-04-30 LAB — CBC WITH DIFFERENTIAL (CANCER CENTER ONLY)
Abs Immature Granulocytes: 0 10*3/uL (ref 0.00–0.07)
Basophils Absolute: 0 10*3/uL (ref 0.0–0.1)
Basophils Relative: 0 %
Eosinophils Absolute: 0 10*3/uL (ref 0.0–0.5)
Eosinophils Relative: 0 %
HCT: 31.6 % — ABNORMAL LOW (ref 36.0–46.0)
Hemoglobin: 10.5 g/dL — ABNORMAL LOW (ref 12.0–15.0)
Immature Granulocytes: 0 %
Lymphocytes Relative: 65 %
Lymphs Abs: 1.9 10*3/uL (ref 0.7–4.0)
MCH: 32.8 pg (ref 26.0–34.0)
MCHC: 33.2 g/dL (ref 30.0–36.0)
MCV: 98.8 fL (ref 80.0–100.0)
Monocytes Absolute: 0.5 10*3/uL (ref 0.1–1.0)
Monocytes Relative: 15 %
Neutro Abs: 0.6 10*3/uL — ABNORMAL LOW (ref 1.7–7.7)
Neutrophils Relative %: 20 %
Platelet Count: 215 10*3/uL (ref 150–400)
RBC: 3.2 MIL/uL — ABNORMAL LOW (ref 3.87–5.11)
RDW: 14.7 % (ref 11.5–15.5)
WBC Count: 3 10*3/uL — ABNORMAL LOW (ref 4.0–10.5)
nRBC: 0 % (ref 0.0–0.2)

## 2020-04-30 MED ORDER — DIPHENHYDRAMINE HCL 50 MG/ML IJ SOLN
25.0000 mg | Freq: Once | INTRAMUSCULAR | Status: AC
  Administered 2020-04-30: 25 mg via INTRAVENOUS

## 2020-04-30 MED ORDER — PALONOSETRON HCL INJECTION 0.25 MG/5ML
INTRAVENOUS | Status: AC
  Filled 2020-04-30: qty 5

## 2020-04-30 MED ORDER — DEXTROSE 5 % IV SOLN
Freq: Once | INTRAVENOUS | Status: AC
  Filled 2020-04-30: qty 250

## 2020-04-30 MED ORDER — SODIUM CHLORIDE 0.9% FLUSH
10.0000 mL | INTRAVENOUS | Status: DC | PRN
  Administered 2020-04-30: 10 mL
  Filled 2020-04-30: qty 10

## 2020-04-30 MED ORDER — SODIUM CHLORIDE 0.9 % IV SOLN
357.2000 mg | Freq: Once | INTRAVENOUS | Status: AC
  Administered 2020-04-30: 360 mg via INTRAVENOUS
  Filled 2020-04-30: qty 36

## 2020-04-30 MED ORDER — SODIUM CHLORIDE 0.9 % IV SOLN
10.0000 mg | Freq: Once | INTRAVENOUS | Status: AC
  Administered 2020-04-30: 10 mg via INTRAVENOUS
  Filled 2020-04-30: qty 10

## 2020-04-30 MED ORDER — SODIUM CHLORIDE 0.9 % IV SOLN
150.0000 mg | Freq: Once | INTRAVENOUS | Status: AC
  Administered 2020-04-30: 150 mg via INTRAVENOUS
  Filled 2020-04-30: qty 150

## 2020-04-30 MED ORDER — FAMOTIDINE IN NACL 20-0.9 MG/50ML-% IV SOLN
INTRAVENOUS | Status: AC
  Filled 2020-04-30: qty 50

## 2020-04-30 MED ORDER — DOXORUBICIN HCL LIPOSOMAL CHEMO INJECTION 2 MG/ML
28.4000 mg/m2 | Freq: Once | INTRAVENOUS | Status: AC
  Administered 2020-04-30: 60 mg via INTRAVENOUS
  Filled 2020-04-30: qty 30

## 2020-04-30 MED ORDER — FAMOTIDINE IN NACL 20-0.9 MG/50ML-% IV SOLN
20.0000 mg | Freq: Once | INTRAVENOUS | Status: AC
  Administered 2020-04-30: 20 mg via INTRAVENOUS

## 2020-04-30 MED ORDER — DIPHENHYDRAMINE HCL 50 MG/ML IJ SOLN
INTRAMUSCULAR | Status: AC
  Filled 2020-04-30: qty 1

## 2020-04-30 MED ORDER — SODIUM CHLORIDE 0.9% FLUSH
10.0000 mL | Freq: Once | INTRAVENOUS | Status: AC
  Administered 2020-04-30: 10 mL
  Filled 2020-04-30: qty 10

## 2020-04-30 MED ORDER — HEPARIN SOD (PORK) LOCK FLUSH 100 UNIT/ML IV SOLN
500.0000 [IU] | Freq: Once | INTRAVENOUS | Status: AC | PRN
  Administered 2020-04-30: 500 [IU]
  Filled 2020-04-30: qty 5

## 2020-04-30 MED ORDER — PALONOSETRON HCL INJECTION 0.25 MG/5ML
0.2500 mg | Freq: Once | INTRAVENOUS | Status: AC
  Administered 2020-04-30: 0.25 mg via INTRAVENOUS

## 2020-04-30 NOTE — Patient Instructions (Signed)

## 2020-04-30 NOTE — Patient Instructions (Signed)
[facility] Discharge Instructions for Patients Receiving Chemotherapy  Today you received the following chemotherapy agents: doxorubicin liposomal, carboplatin  To help prevent nausea and vomiting after your treatment, we encourage you to take your nausea medication as directed.    If you develop nausea and vomiting that is not controlled by your nausea medication, call the clinic.   BELOW ARE SYMPTOMS THAT SHOULD BE REPORTED IMMEDIATELY:  *FEVER GREATER THAN 100.5 F  *CHILLS WITH OR WITHOUT FEVER  NAUSEA AND VOMITING THAT IS NOT CONTROLLED WITH YOUR NAUSEA MEDICATION  *UNUSUAL SHORTNESS OF BREATH  *UNUSUAL BRUISING OR BLEEDING  TENDERNESS IN MOUTH AND THROAT WITH OR WITHOUT PRESENCE OF ULCERS  *URINARY PROBLEMS  *BOWEL PROBLEMS  UNUSUAL RASH Items with * indicate a potential emergency and should be followed up as soon as possible.  Feel free to call the clinic should you have any questions or concerns. The clinic phone number is [phone].  Please show the [place] at check-in to the Emergency Department and triage nurse.

## 2020-05-01 ENCOUNTER — Encounter: Payer: Self-pay | Admitting: Hematology and Oncology

## 2020-05-01 DIAGNOSIS — R7989 Other specified abnormal findings of blood chemistry: Secondary | ICD-10-CM | POA: Insufficient documentation

## 2020-05-01 NOTE — Assessment & Plan Note (Signed)
This is due to side effects of treatment She is relatively asymptomatic I will plan to proceed with treatment as scheduled but will prescribe dose reduction

## 2020-05-01 NOTE — Assessment & Plan Note (Signed)
We have extensive discussions about the use of aggressive laxative therapy

## 2020-05-01 NOTE — Assessment & Plan Note (Signed)
She tolerated this current combination of chemotherapy slightly better except for constipation, fatigue and pancytopenia I plan to reduce the dose of carboplatin a little bit but we will proceed with treatment today despite mild pancytopenia I recommend minimum 3 cycles of treatment before repeating imaging study

## 2020-05-01 NOTE — Assessment & Plan Note (Signed)
She has slight elevated serum creatinine I will adjust the dose of chemotherapy accordingly

## 2020-05-01 NOTE — Progress Notes (Signed)
[facility] OFFICE PROGRESS NOTE  Patient Care Team: Daniel, Terry G, MD as PCP - General (Family Medicine) Hess, Karen R, RN as Oncology Nurse Navigator (Oncology)  ASSESSMENT & PLAN:  Right ovarian epithelial cancer (HCC) She tolerated this current combination of chemotherapy slightly better except for constipation, fatigue and pancytopenia I plan to reduce the dose of carboplatin a little bit but we will proceed with treatment today despite mild pancytopenia I recommend minimum 3 cycles of treatment before repeating imaging study  Pancytopenia, acquired (HCC) This is due to side effects of treatment She is relatively asymptomatic I will plan to proceed with treatment as scheduled but will prescribe dose reduction  Other constipation We have extensive discussions about the use of aggressive laxative therapy  Elevated serum creatinine She has slight elevated serum creatinine I will adjust the dose of chemotherapy accordingly   No orders of the defined types were placed in this encounter.   All questions were answered. The patient knows to call the clinic with any problems, questions or concerns. The total time spent in the appointment was 30 minutes encounter with patients including review of chart and various tests results, discussions about plan of care and coordination of care plan   Ni Gorsuch, MD 05/01/2020 8:08 AM  INTERVAL HISTORY: Please see below for problem oriented charting. She is seen for chemotherapy and follow-up She tolerated treatment fairly well except for fatigue, constipation but denies symptoms of nausea, chest pain or shortness of breath Her appetite is fair The patient denies any recent signs or symptoms of bleeding such as spontaneous epistaxis, hematuria or hematochezia. 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  She has bloating and early satiety. Shortness of breath with walking. She denied bleeding. She reported constipation with pain with defecation and narrowed stools   11/29/2018 Imaging   1. 13 cm complex cystic lesion in the central pelvis, highly suspicious for ovarian cystadenocarcinoma. 2. Diffuse peritoneal carcinomatosis with mild ascites. 3. Mild lymphadenopathy in porta hepatis and right cardiophrenic angle, suspicious for metastatic disease. 4. Moderate right and tiny left pleural effusions   12/05/2018 Tumor Marker   Patient's tumor was tested for the following markers: CA-125 Results of the tumor marker test revealed 1015.   12/06/2018 Cancer Staging   Staging form: Ovary, Fallopian Tube, and Primary Peritoneal Carcinoma, AJCC 8th Edition - Clinical: FIGO Stage IVA, calculated as Stage IV (cT3c, cN1, cM1) - Signed by Gorsuch, Ni, MD on 12/06/2018   12/09/2018 Pathology Results   PLEURAL FLUID, RIGHT (SPECIMEN 1 OF 1 COLLECTED 12/09/18): - MALIGNANT CELLS CONSISTENT WITH METASTATIC ADENOCARCINOMA - SEE COMMENT Comment The neoplastic cells are positive for cytokeratin 7 and Pax-8 but negative for cytokeratin 20, TTF-1, CDX-2 and Gata-3. Overall, the phenotype is consistent with the clinical impression of gynecologic primary.    12/15/2018 Procedure   Placement of single lumen port a cath via right internal jugular vein. The catheter tip lies at the cavo-atrial junction. A power injectable port a cath was placed and is ready for immediate use   12/16/2018 - 08/24/2019 Chemotherapy   The patient had carboplatin and taxol for chemotherapy treatment.  She had 7 cycles given neoadjuvant prior to surgery and 3 more cycles after surgery, for a total of 10 cycles of treatment    01/06/2019   Tumor Marker   Patient's tumor was tested for the following markers: CA-125 Results of the tumor marker test revealed 948   02/02/2019  Imaging   1. Massive pulmonary embolism, as discussed above. Given the mildly elevated RV to LV ratio of 0.95, this is associated with increased risk of morbidity and mortality. 2. Today's study demonstrates a mixed response to therapy. Specifically, while there has been regression of the bulky intraperitoneal metastatic disease and regression of previously noted pleural effusions, the large cystic mass in the central pelvis has increased in size compared to the prior study. 3. New right mild hydroureteronephrosis related to extrinsic compression on the distal third of the right ureter by the patient's large pelvic mass. 4. Scattered small pulmonary nodules (predominantly pleural based) appear stable compared to prior examinations. These are nonspecific but warrant continued attention on follow-up studies. 5. Aortic atherosclerosis, in addition to three-vessel coronary artery disease. Assessment for potential risk factor modification, dietary therapy or pharmacologic therapy may be warranted, if clinically indicated.   02/02/2019 - 02/04/2019 Hospital Admission   She was admitted the hospital due to DVT and PE   02/03/2019 Imaging   Bilateral venous Doppler US Right: Findings consistent with acute deep vein thrombosis involving the right femoral vein, right popliteal vein, right peroneal veins, right soleal veins, and right gastrocnemius veins. No cystic structure found in the popliteal fossa. Left: There is no evidence of deep vein thrombosis in the lower extremity. No cystic structure found in the popliteal fossa   02/17/2019 Tumor Marker   Patient's tumor was tested for the following markers: CA-125 Results of the tumor marker test revealed 127   03/10/2019 Tumor Marker   Patient's tumor was tested for the following markers: CA-125. Results of the tumor marker test revealed 87.4   03/14/2019 - 03/16/2019 Hospital Admission   She was admitted to the hospital recently for weakness   03/31/2019 Tumor  Marker   Patient's tumor was tested for the following markers: CA-125 Results of the tumor marker test revealed 59.2.   04/28/2019 Tumor Marker   Patient's tumor was tested for the following markers: CA-125 Results of the tumor marker test revealed 56.8   05/05/2019 Imaging   1. Interval decrease in size of the large cystic mass in the central pelvis. Mesenteric and omental soft tissue disease shows no substantial interval change. 2. The mild right hydroureteronephrosis seen previously has resolved in the interval. 3. Small residual nonobstructive thrombus identified in the inter lobar pulmonary artery common here into the lateral wall compatible with chronicity. 4. 14 mm subtle enhancing lesion in the anterior right liver is stable. This may be vascular malformation. Attention on follow-up recommended. 5. Stable appearance of the multiple small bilateral pulmonary nodules. Continued attention on follow-up recommended. 6.  Aortic Atherosclerois (ICD10-170.0)     06/06/2019 Pathology Results   A. OMENTUM, RESECTION: - Metastatic carcinoma. B. RIGHT FALLOPIAN TUBE AND OVARY, SALPINGOOOPHORECTOMY: - High-grade serous carcinoma, spanning 9 cm. - No surface involvement identified. - Fallopian tube involved by carcinoma. - See oncology table. C. PERITONEAL NODULE, EXCISION: - Metastatic carcinoma. ONCOLOGY TABLE: OVARY or FALLOPIAN TUBE or PRIMARY PERITONEUM: Procedure: Right salpingo-oophorectomy, omental resection, and peritoneal biopsy. Specimen Integrity: Intact Tumor Site: Right ovary Ovarian Surface Involvement (required only if applicable): Not identified Tumor Size: 9 cm Histologic Type: High-grade serous carcinoma Histologic Grade: High-grade Implants (required for advanced stage serous/seromucinous borderline tumors only): Omentum, peritoneum. Other Tissue/ Organ Involvement: Right fallopian tube Largest Extrapelvic Peritoneal Focus (required only if   applicable): 7.3  cm Peritoneal/Ascitic Fluid: Positive pleural fluid pre neoadjuvant therapy. Treatment Effect (required only for high-grade serous carcinomas): Probable treatment effect present. Regional Lymph Nodes: No lymph nodes submitted or found Pathologic Stage Classification (pTNM, AJCC 8th Edition): ypT3c, ypNX Representative Tumor Block: B2   06/06/2019 Surgery   Preoperative Diagnosis: stage IV ovarian cancer, s/p neoadjuvant chemotherapy, history of recent PE.  Procedure(s) Performed: Exploratory laparotomy with right salpingo-oophorectomy, omentectomy radical tumor debulking for ovarian cancer .   Surgeon: Emma C Rossi, MD.    Operative Findings:  Omental cake adherent to anterior abdominal wall and hepatic flexure. 10cm right tube and ovary. Surgically absent uterus and left tube and ovary. Granular nodularity across right diaphragm.    This represented an optimal cytoreduction (R0) with no gross visible disease remaining.    07/06/2019 Tumor Marker   Patient's tumor was tested for the following markers:CA-125 Results of the tumor marker test revealed 16.4   08/10/2019 Genetic Testing   Negative genetic testing. No pathogenic variants identified. VUS in BRCA2 called [id number] identified on the [facility] CancerNext+RNAinsight panel. The report date is 08/10/2019.  The CancerNext+RNAinsight gene panel offered by [facility] [employer] includes sequencing and rearrangement analysis for the following 36 genes: APC*, ATM*, AXIN2, BARD1, BMPR1A, BRCA1*, BRCA2*, BRIP1*, CDH1*, CDK4, CDKN2A, CHEK2*, DICER1, MLH1*, MSH2*, MSH3, MSH6*, MUTYH*, NBN, NF1*, NTHL1, PALB2*, PMS2*, PTEN*, RAD51C*, RAD51D*, RECQL, SMAD4, SMARCA4, STK11 and TP53* (sequencing and deletion/duplication); HOXB13, POLD1 and POLE (sequencing only); EPCAM and GREM1 (deletion/duplication only). DNA and RNA analyses performed for * genes.   HRD (somatic) testing was initially ordered and was not completed due to insufficient amount of tumor sample.     08/24/2019 Tumor Marker   Patient's tumor was tested for the following markers: CA-125 Results of the tumor marker test revealed 12.2   09/20/2019 Imaging   1. Interval debulking, bilateral salpingo oophorectomy and omentectomy with resection of the dominant pelvic cystic lesion and the bandlike soft tissue seen previously in the right mesentery/omentum. Today's study is status shows new postoperative baseline for follow-up. 2. 6 mm gastrohepatic ligament nodule seen on the previous study is stable. Continued attention on follow-up recommended. 3. No new or progressive findings in the chest, abdomen, or pelvis to suggest disease progression. 4. Stable 1.4 cm focus of homogeneous enhancement in the anterior right liver. Stable since at least 11/29/2018 and likely benign. Continued attention on follow-up recommended. 5. Aortic Atherosclerosis (ICD10-I70.0).   09/20/2019 Tumor Marker   Patient's tumor was tested for the following markers: CA-125 Results of the tumor marker test revealed 11.9   12/26/2019 Tumor Marker   Patient's tumor was tested for the following markers: CA-125 Results of the tumor marker test revealed 16.5   03/25/2020 Imaging   1. Interval progression of peritoneal disease which predominantly involves the serosal surface of the proximal and distal transverse colon, and descending colon. 2. New bilateral inguinal adenopathy. 3. No ascites. 4. Aortic atherosclerosis.   03/25/2020 Tumor Marker   Patient's tumor was tested for the following markers: CA-125 Results of the tumor marker test revealed 249   03/28/2020 Echocardiogram    1. Borderline LV Strain; A2C view represents the most accurate acquisition.. Left ventricular ejection fraction, by estimation, is 60 to 65%. Left ventricular ejection fraction by PLAX is 63 %. The left ventricle has normal function. The left ventricle demonstrates regional wall motion abnormalities (see scoring diagram/findings for description). Left  ventricular diastolic parameters were normal.  2. Right ventricular systolic function is normal. The right   ventricular size is normal.  3. Left atrial size was mildly dilated.  4. The mitral valve is normal in structure. No evidence of mitral valve regurgitation.  5. The aortic valve is grossly normal. Aortic valve regurgitation is mild.  6. The inferior vena cava is normal in size with greater than 50% respiratory variability, suggesting right atrial pressure of 3 mmHg.   04/02/2020 Tumor Marker   Patient's tumor was tested for the following markers: CA-125 Results of the tumor marker test revealed 304   04/02/2020 -  Chemotherapy   The patient had carboplatin and Doxil for chemotherapy treatment.       REVIEW OF SYSTEMS:   Constitutional: Denies fevers, chills or abnormal weight loss Eyes: Denies blurriness of vision Ears, nose, mouth, throat, and face: Denies mucositis or sore throat Respiratory: Denies cough, dyspnea or wheezes Cardiovascular: Denies palpitation, chest discomfort or lower extremity swelling Skin: Denies abnormal skin rashes Lymphatics: Denies new lymphadenopathy or easy bruising Neurological:Denies numbness, tingling or new weaknesses Behavioral/Psych: Mood is stable, no new changes  All other systems were reviewed with the patient and are negative.  I have reviewed the past medical history, past surgical history, social history and family history with the patient and they are unchanged from previous note.  ALLERGIES:  has No Known Allergies.  MEDICATIONS:  Current Outpatient Medications  Medication Sig Dispense Refill  . acetaminophen (TYLENOL) 325 MG tablet Take 650 mg by mouth every 6 (six) hours as needed for moderate pain or headache.     Marland Kitchen aspirin EC 81 MG tablet Take 81 mg by mouth daily. Swallow whole.    . celecoxib (CELEBREX) 200 MG capsule celecoxib 200 mg capsule    . esomeprazole (NEXIUM) 40 MG capsule Take 40 mg by mouth 2 (two) times daily.      Marland Kitchen levothyroxine (SYNTHROID) 50 MCG tablet TAKE ONE TABLET EVERY MORNING (Patient taking differently: Take 50 mcg by mouth daily before breakfast. ) 30 tablet 9  . lidocaine-prilocaine (EMLA) cream Apply to affected area once (Patient taking differently: Apply 1 application topically daily as needed (port access). ) 30 g 3  . losartan (COZAAR) 50 MG tablet Take 50 mg by mouth daily.    . Melatonin 3 MG TABS Take by mouth.    . ondansetron (ZOFRAN) 8 MG tablet Take 8 mg by mouth every 8 (eight) hours as needed.    . prochlorperazine (COMPAZINE) 10 MG tablet Take 10 mg by mouth every 6 (six) hours as needed.    . SENEXON-S 8.6-50 MG tablet TAKE 1 TO 2 TABLETS AT BEDTIME FOR AFTER SURGERY (DO NOT TAKE IF HAVING DIARRHEA) 30 tablet 0   No current facility-administered medications for this visit.    PHYSICAL EXAMINATION: ECOG PERFORMANCE STATUS: 1 - Symptomatic but completely ambulatory  Vitals:   04/30/20 1225  BP: (!) 151/70  Pulse: 70  Resp: 18  Temp: (!) 95.6 F (35.3 C)  SpO2: 100%   Filed Weights   04/30/20 1225  Weight: 214 lb 12.8 oz (97.4 kg)    GENERAL:alert, no distress and comfortable SKIN: skin color, texture, turgor are normal, no rashes or significant lesions EYES: normal, Conjunctiva are pink and non-injected, sclera clear OROPHARYNX:no exudate, no erythema and lips, buccal mucosa, and tongue normal  NECK: supple, thyroid normal size, non-tender, without nodularity LYMPH:  no palpable lymphadenopathy in the cervical, axillary or inguinal LUNGS: clear to auscultation and percussion with normal breathing effort HEART: regular rate & rhythm and no murmurs and no  lower extremity edema ABDOMEN:abdomen soft, non-tender and normal bowel sounds Musculoskeletal:no cyanosis of digits and no clubbing  NEURO: alert & oriented x 3 with fluent speech, no focal motor/sensory deficits  LABORATORY DATA:  I have reviewed the data as listed    Component Value Date/Time   NA 139  04/30/2020 1212   K 4.1 04/30/2020 1212   CL 105 04/30/2020 1212   CO2 26 04/30/2020 1212   GLUCOSE 111 (H) 04/30/2020 1212   BUN 15 04/30/2020 1212   CREATININE 1.12 (H) 04/30/2020 1212   CALCIUM 9.5 04/30/2020 1212   PROT 6.8 04/30/2020 1212   ALBUMIN 3.6 04/30/2020 1212   AST 18 04/30/2020 1212   ALT 20 04/30/2020 1212   ALKPHOS 81 04/30/2020 1212   BILITOT <0.2 (L) 04/30/2020 1212   GFRNONAA 51 (L) 04/30/2020 1212   GFRAA >60 03/25/2020 1144    No results found for: SPEP, UPEP  Lab Results  Component Value Date   WBC 3.0 (L) 04/30/2020   NEUTROABS 0.6 (L) 04/30/2020   HGB 10.5 (L) 04/30/2020   HCT 31.6 (L) 04/30/2020   MCV 98.8 04/30/2020   PLT 215 04/30/2020      Chemistry      Component Value Date/Time   NA 139 04/30/2020 1212   K 4.1 04/30/2020 1212   CL 105 04/30/2020 1212   CO2 26 04/30/2020 1212   BUN 15 04/30/2020 1212   CREATININE 1.12 (H) 04/30/2020 1212      Component Value Date/Time   CALCIUM 9.5 04/30/2020 1212   ALKPHOS 81 04/30/2020 1212   AST 18 04/30/2020 1212   ALT 20 04/30/2020 1212   BILITOT <0.2 (L) 04/30/2020 1212

## 2020-05-20 ENCOUNTER — Ambulatory Visit: Payer: Medicare Other | Admitting: Gynecologic Oncology

## 2020-05-20 ENCOUNTER — Other Ambulatory Visit: Payer: Medicare Other

## 2020-05-27 ENCOUNTER — Inpatient Hospital Stay: Payer: Medicare Other

## 2020-05-27 ENCOUNTER — Other Ambulatory Visit: Payer: Self-pay

## 2020-05-27 ENCOUNTER — Inpatient Hospital Stay: Payer: Medicare Other | Admitting: Hematology and Oncology

## 2020-05-27 ENCOUNTER — Other Ambulatory Visit: Payer: Self-pay | Admitting: Hematology and Oncology

## 2020-05-27 ENCOUNTER — Telehealth: Payer: Self-pay | Admitting: Hematology and Oncology

## 2020-05-27 VITALS — BP 150/80 | HR 81 | Temp 97.5°F | Resp 20 | Wt 214.0 lb

## 2020-05-27 DIAGNOSIS — C561 Malignant neoplasm of right ovary: Secondary | ICD-10-CM

## 2020-05-27 DIAGNOSIS — K5909 Other constipation: Secondary | ICD-10-CM | POA: Diagnosis not present

## 2020-05-27 DIAGNOSIS — Z7189 Other specified counseling: Secondary | ICD-10-CM

## 2020-05-27 DIAGNOSIS — R7989 Other specified abnormal findings of blood chemistry: Secondary | ICD-10-CM | POA: Diagnosis not present

## 2020-05-27 DIAGNOSIS — Z5111 Encounter for antineoplastic chemotherapy: Secondary | ICD-10-CM | POA: Diagnosis not present

## 2020-05-27 DIAGNOSIS — M545 Low back pain, unspecified: Secondary | ICD-10-CM | POA: Diagnosis not present

## 2020-05-27 DIAGNOSIS — D61818 Other pancytopenia: Secondary | ICD-10-CM | POA: Diagnosis not present

## 2020-05-27 DIAGNOSIS — C569 Malignant neoplasm of unspecified ovary: Secondary | ICD-10-CM

## 2020-05-27 DIAGNOSIS — R5383 Other fatigue: Secondary | ICD-10-CM | POA: Diagnosis not present

## 2020-05-27 LAB — CBC WITH DIFFERENTIAL (CANCER CENTER ONLY)
Abs Immature Granulocytes: 0.01 10*3/uL (ref 0.00–0.07)
Basophils Absolute: 0 10*3/uL (ref 0.0–0.1)
Basophils Relative: 0 %
Eosinophils Absolute: 0 10*3/uL (ref 0.0–0.5)
Eosinophils Relative: 1 %
HCT: 32.1 % — ABNORMAL LOW (ref 36.0–46.0)
Hemoglobin: 10.9 g/dL — ABNORMAL LOW (ref 12.0–15.0)
Immature Granulocytes: 0 %
Lymphocytes Relative: 45 %
Lymphs Abs: 1.7 10*3/uL (ref 0.7–4.0)
MCH: 34.7 pg — ABNORMAL HIGH (ref 26.0–34.0)
MCHC: 34 g/dL (ref 30.0–36.0)
MCV: 102.2 fL — ABNORMAL HIGH (ref 80.0–100.0)
Monocytes Absolute: 0.6 10*3/uL (ref 0.1–1.0)
Monocytes Relative: 16 %
Neutro Abs: 1.4 10*3/uL — ABNORMAL LOW (ref 1.7–7.7)
Neutrophils Relative %: 38 %
Platelet Count: 230 10*3/uL (ref 150–400)
RBC: 3.14 MIL/uL — ABNORMAL LOW (ref 3.87–5.11)
RDW: 19 % — ABNORMAL HIGH (ref 11.5–15.5)
WBC Count: 3.8 10*3/uL — ABNORMAL LOW (ref 4.0–10.5)
nRBC: 0.5 % — ABNORMAL HIGH (ref 0.0–0.2)

## 2020-05-27 LAB — COMPREHENSIVE METABOLIC PANEL
ALT: 18 U/L (ref 0–44)
AST: 19 U/L (ref 15–41)
Albumin: 3.7 g/dL (ref 3.5–5.0)
Alkaline Phosphatase: 77 U/L (ref 38–126)
Anion gap: 10 (ref 5–15)
BUN: 15 mg/dL (ref 8–23)
CO2: 25 mmol/L (ref 22–32)
Calcium: 9.9 mg/dL (ref 8.9–10.3)
Chloride: 104 mmol/L (ref 98–111)
Creatinine, Ser: 1.08 mg/dL — ABNORMAL HIGH (ref 0.44–1.00)
GFR, Estimated: 53 mL/min — ABNORMAL LOW (ref >60)
Glucose, Bld: 95 mg/dL (ref 70–99)
Potassium: 4.2 mmol/L (ref 3.5–5.1)
Sodium: 139 mmol/L (ref 135–145)
Total Bilirubin: 0.2 mg/dL — ABNORMAL LOW (ref 0.3–1.2)
Total Protein: 7 g/dL (ref 6.5–8.1)

## 2020-05-27 MED ORDER — SODIUM CHLORIDE 0.9 % IV SOLN
10.0000 mg | Freq: Once | INTRAVENOUS | Status: AC
  Administered 2020-05-27: 10 mg via INTRAVENOUS
  Filled 2020-05-27: qty 10

## 2020-05-27 MED ORDER — SODIUM CHLORIDE 0.9 % IV SOLN
367.6000 mg | Freq: Once | INTRAVENOUS | Status: AC
  Administered 2020-05-27: 370 mg via INTRAVENOUS
  Filled 2020-05-27: qty 37

## 2020-05-27 MED ORDER — SODIUM CHLORIDE 0.9% FLUSH
10.0000 mL | INTRAVENOUS | Status: DC | PRN
  Administered 2020-05-27: 10 mL
  Filled 2020-05-27: qty 10

## 2020-05-27 MED ORDER — PALONOSETRON HCL INJECTION 0.25 MG/5ML
0.2500 mg | Freq: Once | INTRAVENOUS | Status: AC
  Administered 2020-05-27: 0.25 mg via INTRAVENOUS

## 2020-05-27 MED ORDER — DIPHENHYDRAMINE HCL 50 MG/ML IJ SOLN
INTRAMUSCULAR | Status: AC
  Filled 2020-05-27: qty 1

## 2020-05-27 MED ORDER — DEXTROSE 5 % IV SOLN
Freq: Once | INTRAVENOUS | Status: AC
  Filled 2020-05-27: qty 250

## 2020-05-27 MED ORDER — DIPHENHYDRAMINE HCL 50 MG/ML IJ SOLN
25.0000 mg | Freq: Once | INTRAMUSCULAR | Status: AC
  Administered 2020-05-27: 25 mg via INTRAVENOUS

## 2020-05-27 MED ORDER — PALONOSETRON HCL INJECTION 0.25 MG/5ML
INTRAVENOUS | Status: AC
  Filled 2020-05-27: qty 5

## 2020-05-27 MED ORDER — SODIUM CHLORIDE 0.9% FLUSH
10.0000 mL | Freq: Once | INTRAVENOUS | Status: AC
  Administered 2020-05-27: 10 mL
  Filled 2020-05-27: qty 10

## 2020-05-27 MED ORDER — SODIUM CHLORIDE 0.9 % IV SOLN
150.0000 mg | Freq: Once | INTRAVENOUS | Status: AC
  Administered 2020-05-27: 150 mg via INTRAVENOUS
  Filled 2020-05-27: qty 150

## 2020-05-27 MED ORDER — FAMOTIDINE IN NACL 20-0.9 MG/50ML-% IV SOLN
INTRAVENOUS | Status: AC
  Filled 2020-05-27: qty 50

## 2020-05-27 MED ORDER — HEPARIN SOD (PORK) LOCK FLUSH 100 UNIT/ML IV SOLN
500.0000 [IU] | Freq: Once | INTRAVENOUS | Status: AC | PRN
  Administered 2020-05-27: 500 [IU]
  Filled 2020-05-27: qty 5

## 2020-05-27 MED ORDER — DOXORUBICIN HCL LIPOSOMAL CHEMO INJECTION 2 MG/ML
28.4000 mg/m2 | Freq: Once | INTRAVENOUS | Status: AC
  Administered 2020-05-27: 60 mg via INTRAVENOUS
  Filled 2020-05-27: qty 30

## 2020-05-27 MED ORDER — FAMOTIDINE IN NACL 20-0.9 MG/50ML-% IV SOLN
20.0000 mg | Freq: Once | INTRAVENOUS | Status: AC
  Administered 2020-05-27: 20 mg via INTRAVENOUS

## 2020-05-27 NOTE — Telephone Encounter (Signed)
Scheduled appts per 11/29 sch msg. Gave pt a print out of AVS.

## 2020-05-27 NOTE — Progress Notes (Signed)
Per Dr. Alvy Bimler, ok to treat with [place] 1.4.

## 2020-05-27 NOTE — Patient Instructions (Signed)

## 2020-05-27 NOTE — Patient Instructions (Signed)
[facility] Discharge Instructions for Patients Receiving Chemotherapy  Today you received the following chemotherapy agents: doxorubicin liposomal and carboplatin.  To help prevent nausea and vomiting after your treatment, we encourage you to take your nausea medication as directed.   If you develop nausea and vomiting that is not controlled by your nausea medication, call the clinic.   BELOW ARE SYMPTOMS THAT SHOULD BE REPORTED IMMEDIATELY:  *FEVER GREATER THAN 100.5 F  *CHILLS WITH OR WITHOUT FEVER  NAUSEA AND VOMITING THAT IS NOT CONTROLLED WITH YOUR NAUSEA MEDICATION  *UNUSUAL SHORTNESS OF BREATH  *UNUSUAL BRUISING OR BLEEDING  TENDERNESS IN MOUTH AND THROAT WITH OR WITHOUT PRESENCE OF ULCERS  *URINARY PROBLEMS  *BOWEL PROBLEMS  UNUSUAL RASH Items with * indicate a potential emergency and should be followed up as soon as possible.  Feel free to call the clinic should you have any questions or concerns. The clinic phone number is [phone].  Please show the [facility] at check-in to the Emergency Department and triage nurse.  Doxorubicin Liposomal injection What is this medicine? LIPOSOMAL DOXORUBICIN (LIP oh som al dox oh ROO bi sin) is a chemotherapy drug. This medicine is used to treat many kinds of cancer like Kaposi's sarcoma, multiple myeloma, and ovarian cancer. This medicine may be used for other purposes; ask your health care provider or pharmacist if you have questions. COMMON BRAND NAME(S): Doxil, Lipodox What should I tell my health care provider before I take this medicine? They need to know if you have any of these conditions:  blood disorders  heart disease  infection (especially a virus infection such as chickenpox, cold sores, or herpes)  liver disease  recent or ongoing radiation therapy  an unusual or allergic reaction to doxorubicin, other chemotherapy agents, soybeans, other medicines, foods, dyes, or  preservatives  pregnant or trying to get pregnant  breast-feeding How should I use this medicine? This drug is given as an infusion into a vein. It is administered in a hospital or clinic by a specially trained health care professional. If you have pain, swelling, burning or any unusual feeling around the site of your injection, tell your health care professional right away. Talk to your pediatrician regarding the use of this medicine in children. Special care may be needed. Overdosage: If you think you have taken too much of this medicine contact a poison control center or emergency room at once. NOTE: This medicine is only for you. Do not share this medicine with others. What if I miss a dose? It is important not to miss your dose. Call your doctor or health care professional if you are unable to keep an appointment. What may interact with this medicine? Do not take this medicine with any of the following medications:  zidovudine This medicine may also interact with the following medications:  medicines to increase blood counts like filgrastim, pegfilgrastim, sargramostim  vaccines Talk to your doctor or health care professional before taking any of these medicines:  acetaminophen  aspirin  ibuprofen  ketoprofen  naproxen This list may not describe all possible interactions. Give your health care provider a list of all the medicines, herbs, non-prescription drugs, or dietary supplements you use. Also tell them if you smoke, drink alcohol, or use illegal drugs. Some items may interact with your medicine. What should I watch for while using this medicine? Your condition will be monitored carefully while you are receiving this medicine. You may need blood work done while you are  taking this medicine. This drug may make you feel generally unwell. This is not uncommon, as chemotherapy can affect healthy cells as well as cancer cells. Report any side effects. Continue your course of  treatment even though you feel ill unless your doctor tells you to stop. Your urine may turn orange-red for a few days after your dose. This is not blood. If your urine is dark or brown, call your doctor. In some cases, you may be given additional medicines to help with side effects. Follow all directions for their use. Talk to your doctor about your risk of cancer. You may be more at risk for certain types of cancers if you take this medicine. Do not become pregnant while taking this medicine or for 6 months after stopping it. Women should inform their healthcare professional if they wish to become pregnant or think they may be pregnant. Men should not father a child while taking this medicine and for 6 months after stopping it. There is a potential for serious side effects to an unborn child. Talk to your health care professional or pharmacist for more information. Do not breast-feed an infant while taking this medicine. This medicine has caused ovarian failure in some women. This medicine may make it more difficult to get pregnant. Talk to your healthcare professional if you are concerned about your fertility. This medicine has caused decreased sperm counts in some men. This may make it more difficult to father a child. Talk to your healthcare professional if you are concerned about your fertility. This medicine may cause a decrease in Co-Enzyme Q-10. You should make sure that you get enough Co-Enzyme Q-10 while you are taking this medicine. Discuss the foods you eat and the vitamins you take with your health care professional. What side effects may I notice from receiving this medicine? Side effects that you should report to your doctor or health care professional as soon as possible:  allergic reactions like skin rash, itching or hives, swelling of the face, lips, or tongue  low blood counts - this medicine may decrease the number of white blood cells, red blood cells and platelets. You may be at  increased risk for infections and bleeding.  signs of hand-foot syndrome - tingling or burning, redness, flaking, swelling, small blisters, or small sores on the palms of your hands or the soles of your feet  signs of infection - fever or chills, cough, sore throat, pain or difficulty passing urine  signs of decreased platelets or bleeding - bruising, pinpoint red spots on the skin, black, tarry stools, blood in the urine  signs of decreased red blood cells - unusually weak or tired, fainting spells, lightheadedness  back pain, chills, facial flushing, fever, headache, tightness in the chest or throat during the infusion  breathing problems  chest pain  fast, irregular heartbeat  mouth pain, redness, sores  pain, swelling, redness at site where injected  pain, tingling, numbness in the hands or feet  swelling of ankles, feet, or hands  vomiting Side effects that usually do not require medical attention (report to your doctor or health care professional if they continue or are bothersome):  diarrhea  hair loss  loss of appetite  nail discoloration or damage  nausea  red or watery eyes  red colored urine  stomach upset This list may not describe all possible side effects. Call your doctor for medical advice about side effects. You may report side effects to FDA at 1-800-FDA-1088. Where should I keep  my medicine? This drug is given in a hospital or clinic and will not be stored at home. NOTE: This sheet is a summary. It may not cover all possible information. If you have questions about this medicine, talk to your doctor, pharmacist, or health care provider.  2020 [employer] (2018-02-21 15:13:26)

## 2020-05-28 ENCOUNTER — Encounter: Payer: Self-pay | Admitting: Hematology and Oncology

## 2020-05-28 DIAGNOSIS — M545 Low back pain, unspecified: Secondary | ICD-10-CM | POA: Insufficient documentation

## 2020-05-28 LAB — CA 125: Cancer Antigen (CA) 125: 42.7 U/mL — ABNORMAL HIGH (ref 0.0–38.1)

## 2020-05-28 NOTE — Progress Notes (Signed)
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[facility]) I am concerned about her back pain Even though her tumor marker was better, I plan to order CT imaging for objective assessment of response to therapy before her next cycle She is in agreement  Lower back pain She has new onset of lower back pain Her exam is benign Due to the nature of her disease, I recommend we proceed to get CT imaging before her next visit In the meantime, she will continue to take pain medicine as needed   Pancytopenia, acquired (HCC) She has pancytopenia from treatment but not symptomatic Observe closely for now She does not need transfusion support  Elevated serum creatinine She has intermittent elevated serum creatinine I will adjust the dose of her treatment accordingly   Orders Placed This Encounter  Procedures  . CT ABDOMEN PELVIS W CONTRAST    Standing Status:   Future    Standing Expiration Date:   05/27/2021    Order Specific Question:   If indicated for the ordered procedure, I authorize the administration of contrast media per Radiology protocol    Answer:   Yes    Order Specific Question:   Preferred imaging location?    Answer:   [facility]    Order Specific Question:   Radiology Contrast Protocol - do NOT remove file path    Answer:   [email or web address]  . CA 125    Standing Status:   Standing    Number of Occurrences:   11    Standing Expiration Date:   05/27/2021    All questions were answered. The patient knows to call the clinic with any problems, questions or concerns. The total time spent in the appointment was 30 minutes encounter with patients including review of chart and various tests results, discussions about plan of care and coordination  of care plan   Artis Delay, MD 05/28/2020 8:08 AM  INTERVAL HISTORY: Please see below for problem oriented charting. She returns for treatment and follow-up She developed acute back pain over the last week or so She denies recent fall, injuries or muscle strain No recent infection, fever or chills The patient denies any recent signs or symptoms of bleeding such as spontaneous epistaxis, hematuria or hematochezia. 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  She has bloating and early satiety. Shortness of breath with walking. She denied bleeding. She reported constipation with pain with defecation and narrowed stools   11/29/2018 Imaging   1. 13 cm complex cystic lesion in the central pelvis, highly suspicious for ovarian cystadenocarcinoma. 2. Diffuse peritoneal carcinomatosis with mild ascites. 3. Mild lymphadenopathy in porta hepatis and right cardiophrenic angle, suspicious for metastatic disease. 4. Moderate right and tiny left pleural effusions   12/05/2018 Tumor Marker   Patient's tumor was tested for the following markers: CA-125 Results of the tumor marker test revealed 1015.   12/06/2018 Cancer Staging   Staging form: Ovary, Fallopian Tube, and Primary Peritoneal Carcinoma, AJCC 8th Edition - Clinical: FIGO Stage IVA, calculated as Stage IV (cT3c, cN1,  cM1) - Signed by [name], MD on 12/06/2018   12/09/2018 Pathology Results   PLEURAL FLUID, RIGHT (SPECIMEN 1 OF 1 COLLECTED 12/09/18): - MALIGNANT CELLS CONSISTENT WITH METASTATIC ADENOCARCINOMA - SEE COMMENT Comment The neoplastic cells are positive for cytokeratin 7 and Pax-8 but negative for cytokeratin 20, TTF-1, CDX-2 and Gata-3. Overall, the phenotype  is consistent with the clinical impression of gynecologic primary.    12/15/2018 Procedure   Placement of single lumen port a cath via right internal jugular vein. The catheter tip lies at the cavo-atrial junction. A power injectable port a cath was placed and is ready for immediate use   12/16/2018 - 08/24/2019 Chemotherapy   The patient had carboplatin and taxol for chemotherapy treatment.  She had 7 cycles given neoadjuvant prior to surgery and 3 more cycles after surgery, for a total of 10 cycles of treatment    01/06/2019 Tumor Marker   Patient's tumor was tested for the following markers: CA-125 Results of the tumor marker test revealed 948   02/02/2019 Imaging   1. Massive pulmonary embolism, as discussed above. Given the mildly elevated RV to LV ratio of 0.95, this is associated with increased risk of morbidity and mortality. 2. Today's study demonstrates a mixed response to therapy. Specifically, while there has been regression of the bulky intraperitoneal metastatic disease and regression of previously noted pleural effusions, the large cystic mass in the central pelvis has increased in size compared to the prior study. 3. New right mild hydroureteronephrosis related to extrinsic compression on the distal third of the right ureter by the patient's large pelvic mass. 4. Scattered small pulmonary nodules (predominantly pleural based) appear stable compared to prior examinations. These are nonspecific but warrant continued attention on follow-up studies. 5. Aortic atherosclerosis, in addition to three-vessel coronary artery disease. Assessment for potential risk factor modification, dietary therapy or pharmacologic therapy may be warranted, if clinically indicated.   02/02/2019 - 02/04/2019 Hospital Admission   She was admitted the hospital due to DVT and PE   02/03/2019 Imaging   Bilateral venous Doppler US Right: Findings consistent with acute deep vein thrombosis involving the right femoral  vein, right popliteal vein, right peroneal veins, right soleal veins, and right gastrocnemius veins. No cystic structure found in the popliteal fossa. Left: There is no evidence of deep vein thrombosis in the lower extremity. No cystic structure found in the popliteal fossa   02/17/2019 Tumor Marker   Patient's tumor was tested for the following markers: CA-125 Results of the tumor marker test revealed 127   03/10/2019 Tumor Marker   Patient's tumor was tested for the following markers: CA-125. Results of the tumor marker test revealed 87.4   03/14/2019 - 03/16/2019 Hospital Admission   She was admitted to the hospital recently for weakness   03/31/2019 Tumor Marker   Patient's tumor was tested for the following markers: CA-125 Results of the tumor marker test revealed 59.2.   04/28/2019 Tumor Marker   Patient's tumor was tested for the following markers: CA-125 Results of the tumor marker test revealed 56.8   05/05/2019 Imaging   1. Interval decrease in size of the large cystic mass in the central pelvis. Mesenteric and omental soft tissue disease shows no substantial interval change. 2. The mild right hydroureteronephrosis seen previously has resolved in the interval. 3. Small residual nonobstructive thrombus identified in the inter lobar pulmonary artery common here into the lateral wall compatible with chronicity. 4. 14 mm subtle enhancing lesion in the anterior right  liver is stable. This may be vascular malformation. Attention on follow-up recommended. 5. Stable appearance of the multiple small bilateral pulmonary nodules. Continued attention on follow-up recommended. 6.  Aortic Atherosclerois (ICD10-170.0)     06/06/2019 Pathology Results   A. OMENTUM, RESECTION: - Metastatic carcinoma. B. RIGHT FALLOPIAN TUBE AND OVARY, SALPINGOOOPHORECTOMY: - High-grade serous carcinoma, spanning 9 cm. - No surface involvement identified. - Fallopian tube involved by carcinoma. - See oncology  table. C. PERITONEAL NODULE, EXCISION: - Metastatic carcinoma. ONCOLOGY TABLE: OVARY or FALLOPIAN TUBE or PRIMARY PERITONEUM: Procedure: Right salpingo-oophorectomy, omental resection, and peritoneal biopsy. Specimen Integrity: Intact Tumor Site: Right ovary Ovarian Surface Involvement (required only if applicable): Not identified Tumor Size: 9 cm Histologic Type: High-grade serous carcinoma Histologic Grade: High-grade Implants (required for advanced stage serous/seromucinous borderline tumors only): Omentum, peritoneum. Other Tissue/ Organ Involvement: Right fallopian tube Largest Extrapelvic Peritoneal Focus (required only if applicable): 7.3 cm Peritoneal/Ascitic Fluid: Positive pleural fluid pre neoadjuvant therapy. Treatment Effect (required only for high-grade serous carcinomas): Probable treatment effect present. Regional Lymph Nodes: No lymph nodes submitted or found Pathologic Stage Classification (pTNM, AJCC 8th Edition): ypT3c, ypNX Representative Tumor Block: B2   06/06/2019 Surgery   Preoperative Diagnosis: stage IV ovarian cancer, s/p neoadjuvant chemotherapy, history of recent PE.  Procedure(s) Performed: Exploratory laparotomy with right salpingo-oophorectomy, omentectomy radical tumor debulking for ovarian cancer .   Surgeon: Thereasa Solo, MD.    Operative Findings:  Omental cake adherent to anterior abdominal wall and hepatic flexure. 10cm right tube and ovary. Surgically absent uterus and left tube and ovary. Granular nodularity across right diaphragm.    This represented an optimal cytoreduction (R0) with no gross visible disease remaining.    07/06/2019 Tumor Marker   Patient's tumor was tested for the following markers:CA-125 Results of the tumor marker test revealed 16.4   08/10/2019 Genetic Testing   Negative genetic testing. No pathogenic variants identified. VUS in BRCA2 called [id number] identified on the [facility] CancerNext+RNAinsight panel. The report date is  08/10/2019.  The CancerNext+RNAinsight gene panel offered by [facility] includes sequencing and rearrangement analysis for the following 36 genes: APC*, ATM*, AXIN2, BARD1, BMPR1A, BRCA1*, BRCA2*, BRIP1*, CDH1*, CDK4, CDKN2A, CHEK2*, DICER1, MLH1*, MSH2*, MSH3, MSH6*, MUTYH*, NBN, NF1*, NTHL1, PALB2*, PMS2*, PTEN*, RAD51C*, RAD51D*, RECQL, SMAD4, SMARCA4, STK11 and TP53* (sequencing and deletion/duplication); HOXB13, POLD1 and POLE (sequencing only); EPCAM and GREM1 (deletion/duplication only). DNA and RNA analyses performed for * genes.   HRD (somatic) testing was initially ordered and was not completed due to insufficient amount of tumor sample.    08/24/2019 Tumor Marker   Patient's tumor was tested for the following markers: CA-125 Results of the tumor marker test revealed 12.2   09/20/2019 Imaging   1. Interval debulking, bilateral salpingo oophorectomy and omentectomy with resection of the dominant pelvic cystic lesion and the bandlike soft tissue seen previously in the right mesentery/omentum. Today's study is status shows new postoperative baseline for follow-up. 2. 6 mm gastrohepatic ligament nodule seen on the previous study is stable. Continued attention on follow-up recommended. 3. No new or progressive findings in the chest, abdomen, or pelvis to suggest disease progression. 4. Stable 1.4 cm focus of homogeneous enhancement in the anterior right liver. Stable since at least 11/29/2018 and likely benign. Continued attention on follow-up recommended. 5. Aortic Atherosclerosis (ICD10-I70.0).   09/20/2019 Tumor Marker   Patient's tumor was tested for the following markers: CA-125 Results of the tumor marker test revealed 11.9   12/26/2019 Tumor Marker  Patient's tumor was tested for the following markers: CA-125 Results of the tumor marker test revealed 16.5   03/25/2020 Imaging   1. Interval progression of peritoneal disease which predominantly involves the serosal surface of the  proximal and distal transverse colon, and descending colon. 2. New bilateral inguinal adenopathy. 3. No ascites. 4. Aortic atherosclerosis.   03/25/2020 Tumor Marker   Patient's tumor was tested for the following markers: CA-125 Results of the tumor marker test revealed 249   03/28/2020 Echocardiogram    1. Borderline LV Strain; A2C view represents the most accurate acquisition.. Left ventricular ejection fraction, by estimation, is 60 to 65%. Left ventricular ejection fraction by PLAX is 63 %. The left ventricle has normal function. The left ventricle demonstrates regional wall motion abnormalities (see scoring diagram/findings for description). Left ventricular diastolic parameters were normal.  2. Right ventricular systolic function is normal. The right ventricular size is normal.  3. Left atrial size was mildly dilated.  4. The mitral valve is normal in structure. No evidence of mitral valve regurgitation.  5. The aortic valve is grossly normal. Aortic valve regurgitation is mild.  6. The inferior vena cava is normal in size with greater than 50% respiratory variability, suggesting right atrial pressure of 3 mmHg.   04/02/2020 Tumor Marker   Patient's tumor was tested for the following markers: CA-125 Results of the tumor marker test revealed 304   04/02/2020 -  Chemotherapy   The patient had carboplatin and Doxil for chemotherapy treatment.     05/27/2020 Tumor Marker   Patient's tumor was tested for the following markers: CA-125. Results of the tumor marker test revealed 42.7     REVIEW OF SYSTEMS:   Constitutional: Denies fevers, chills or abnormal weight loss Eyes: Denies blurriness of vision Ears, nose, mouth, throat, and face: Denies mucositis or sore throat Respiratory: Denies cough, dyspnea or wheezes Cardiovascular: Denies palpitation, chest discomfort or lower extremity swelling Gastrointestinal:  Denies nausea, heartburn or change in bowel habits Skin: Denies abnormal  skin rashes Lymphatics: Denies new lymphadenopathy or easy bruising Neurological:Denies numbness, tingling or new weaknesses Behavioral/Psych: Mood is stable, no new changes  All other systems were reviewed with the patient and are negative.  I have reviewed the past medical history, past surgical history, social history and family history with the patient and they are unchanged from previous note.  ALLERGIES:  has No Known Allergies.  MEDICATIONS:  Current Outpatient Medications  Medication Sig Dispense Refill  . acetaminophen (TYLENOL) 325 MG tablet Take 650 mg by mouth every 6 (six) hours as needed for moderate pain or headache.     Marland Kitchen aspirin EC 81 MG tablet Take 81 mg by mouth daily. Swallow whole.    . celecoxib (CELEBREX) 200 MG capsule celecoxib 200 mg capsule    . esomeprazole (NEXIUM) 40 MG capsule Take 40 mg by mouth 2 (two) times daily.     Marland Kitchen levothyroxine (SYNTHROID) 50 MCG tablet TAKE ONE TABLET EVERY MORNING (Patient taking differently: Take 50 mcg by mouth daily before breakfast. ) 30 tablet 9  . lidocaine-prilocaine (EMLA) cream Apply to affected area once (Patient taking differently: Apply 1 application topically daily as needed (port access). ) 30 g 3  . losartan (COZAAR) 50 MG tablet Take 50 mg by mouth daily.    . Melatonin 3 MG TABS Take by mouth.    . ondansetron (ZOFRAN) 8 MG tablet Take 8 mg by mouth every 8 (eight) hours as needed.    . prochlorperazine (  COMPAZINE) 10 MG tablet Take 10 mg by mouth every 6 (six) hours as needed.    . SENEXON-S 8.6-50 MG tablet TAKE 1 TO 2 TABLETS AT BEDTIME FOR AFTER SURGERY (DO NOT TAKE IF HAVING DIARRHEA) 30 tablet 0   No current facility-administered medications for this visit.    PHYSICAL EXAMINATION: ECOG PERFORMANCE STATUS: 1 - Symptomatic but completely ambulatory  Vitals:   05/27/20 1152  BP: (!) 150/80  Pulse: 81  Resp: 20  Temp: (!) 97.5 F (36.4 C)  SpO2: 100%   Filed Weights   05/27/20 1152  Weight: 214 lb  (97.1 kg)    GENERAL:alert, no distress and comfortable SKIN: skin color, texture, turgor are normal, no rashes or significant lesions EYES: normal, Conjunctiva are pink and non-injected, sclera clear OROPHARYNX:no exudate, no erythema and lips, buccal mucosa, and tongue normal  NECK: supple, thyroid normal size, non-tender, without nodularity LYMPH:  no palpable lymphadenopathy in the cervical, axillary or inguinal LUNGS: clear to auscultation and percussion with normal breathing effort HEART: regular rate & rhythm and no murmurs and no lower extremity edema ABDOMEN:abdomen soft, non-tender and normal bowel sounds Musculoskeletal:no cyanosis of digits and no clubbing  NEURO: alert & oriented x 3 with fluent speech, no focal motor/sensory deficits  LABORATORY DATA:  I have reviewed the data as listed    Component Value Date/Time   NA 139 05/27/2020 1145   K 4.2 05/27/2020 1145   CL 104 05/27/2020 1145   CO2 25 05/27/2020 1145   GLUCOSE 95 05/27/2020 1145   BUN 15 05/27/2020 1145   CREATININE 1.08 (H) 05/27/2020 1145   CREATININE 1.12 (H) 04/30/2020 1212   CALCIUM 9.9 05/27/2020 1145   PROT 7.0 05/27/2020 1145   ALBUMIN 3.7 05/27/2020 1145   AST 19 05/27/2020 1145   AST 18 04/30/2020 1212   ALT 18 05/27/2020 1145   ALT 20 04/30/2020 1212   ALKPHOS 77 05/27/2020 1145   BILITOT 0.2 (L) 05/27/2020 1145   BILITOT <0.2 (L) 04/30/2020 1212   GFRNONAA 53 (L) 05/27/2020 1145   GFRNONAA 51 (L) 04/30/2020 1212   GFRAA >60 03/25/2020 1144    No results found for: SPEP, UPEP  Lab Results  Component Value Date   WBC 3.8 (L) 05/27/2020   NEUTROABS 1.4 (L) 05/27/2020   HGB 10.9 (L) 05/27/2020   HCT 32.1 (L) 05/27/2020   MCV 102.2 (H) 05/27/2020   PLT 230 05/27/2020      Chemistry      Component Value Date/Time   NA 139 05/27/2020 1145   K 4.2 05/27/2020 1145   CL 104 05/27/2020 1145   CO2 25 05/27/2020 1145   BUN 15 05/27/2020 1145   CREATININE 1.08 (H) 05/27/2020 1145    CREATININE 1.12 (H) 04/30/2020 1212      Component Value Date/Time   CALCIUM 9.9 05/27/2020 1145   ALKPHOS 77 05/27/2020 1145   AST 19 05/27/2020 1145   AST 18 04/30/2020 1212   ALT 18 05/27/2020 1145   ALT 20 04/30/2020 1212   BILITOT 0.2 (L) 05/27/2020 1145   BILITOT <0.2 (L) 04/30/2020 1212

## 2020-05-28 NOTE — Assessment & Plan Note (Signed)
She has new onset of lower back pain Her exam is benign Due to the nature of her disease, I recommend we proceed to get CT imaging before her next visit In the meantime, she will continue to take pain medicine as needed

## 2020-05-28 NOTE — Assessment & Plan Note (Signed)
She has pancytopenia from treatment but not symptomatic Observe closely for now She does not need transfusion support

## 2020-05-28 NOTE — Assessment & Plan Note (Signed)
I am concerned about her back pain Even though her tumor marker was better, I plan to order CT imaging for objective assessment of response to therapy before her next cycle She is in agreement

## 2020-05-28 NOTE — Assessment & Plan Note (Signed)
She has intermittent elevated serum creatinine I will adjust the dose of her treatment accordingly

## 2020-06-10 ENCOUNTER — Other Ambulatory Visit: Payer: Self-pay

## 2020-06-10 ENCOUNTER — Encounter (HOSPITAL_COMMUNITY): Payer: Self-pay

## 2020-06-10 ENCOUNTER — Ambulatory Visit (HOSPITAL_COMMUNITY)
Admission: RE | Admit: 2020-06-10 | Discharge: 2020-06-10 | Disposition: A | Discharge status: Home / Self Care | Payer: Medicare Other | Source: Ambulatory Visit | Attending: Hematology and Oncology | Admitting: Hematology and Oncology

## 2020-06-10 DIAGNOSIS — C561 Malignant neoplasm of right ovary: Secondary | ICD-10-CM | POA: Diagnosis not present

## 2020-06-10 DIAGNOSIS — K639 Disease of intestine, unspecified: Secondary | ICD-10-CM | POA: Diagnosis not present

## 2020-06-10 DIAGNOSIS — K661 Hemoperitoneum: Secondary | ICD-10-CM | POA: Diagnosis not present

## 2020-06-10 DIAGNOSIS — K769 Liver disease, unspecified: Secondary | ICD-10-CM | POA: Diagnosis not present

## 2020-06-10 MED ORDER — IOHEXOL 300 MG/ML  SOLN
100.0000 mL | Freq: Once | INTRAMUSCULAR | Status: AC | PRN
  Administered 2020-06-10: 08:00:00 100 mL via INTRAVENOUS

## 2020-06-10 MED ORDER — HEPARIN SOD (PORK) LOCK FLUSH 100 UNIT/ML IV SOLN
INTRAVENOUS | Status: AC
  Filled 2020-06-10: qty 5

## 2020-06-10 MED ORDER — SODIUM CHLORIDE (PF) 0.9 % IJ SOLN
INTRAMUSCULAR | Status: AC
  Filled 2020-06-10: qty 50

## 2020-06-10 MED ORDER — HEPARIN SOD (PORK) LOCK FLUSH 100 UNIT/ML IV SOLN
500.0000 [IU] | Freq: Once | INTRAVENOUS | Status: AC
  Administered 2020-06-10: 08:00:00 500 [IU] via INTRAVENOUS

## 2020-06-11 ENCOUNTER — Telehealth: Payer: Self-pay | Admitting: Hematology and Oncology

## 2020-06-11 ENCOUNTER — Inpatient Hospital Stay: Payer: Medicare Other | Attending: Gynecologic Oncology | Admitting: Hematology and Oncology

## 2020-06-11 ENCOUNTER — Telehealth: Payer: Self-pay | Admitting: *Deleted

## 2020-06-11 ENCOUNTER — Other Ambulatory Visit: Payer: Self-pay

## 2020-06-11 ENCOUNTER — Encounter: Payer: Self-pay | Admitting: Hematology and Oncology

## 2020-06-11 VITALS — BP 139/64 | HR 74 | Temp 98.1°F | Resp 16 | Ht 67.0 in | Wt 215.6 lb

## 2020-06-11 DIAGNOSIS — Z5111 Encounter for antineoplastic chemotherapy: Secondary | ICD-10-CM | POA: Insufficient documentation

## 2020-06-11 DIAGNOSIS — G8929 Other chronic pain: Secondary | ICD-10-CM | POA: Diagnosis not present

## 2020-06-11 DIAGNOSIS — T451X5A Adverse effect of antineoplastic and immunosuppressive drugs, initial encounter: Secondary | ICD-10-CM

## 2020-06-11 DIAGNOSIS — M545 Low back pain, unspecified: Secondary | ICD-10-CM | POA: Insufficient documentation

## 2020-06-11 DIAGNOSIS — C561 Malignant neoplasm of right ovary: Secondary | ICD-10-CM | POA: Diagnosis not present

## 2020-06-11 DIAGNOSIS — Z7189 Other specified counseling: Secondary | ICD-10-CM

## 2020-06-11 DIAGNOSIS — I2699 Other pulmonary embolism without acute cor pulmonale: Secondary | ICD-10-CM

## 2020-06-11 NOTE — Assessment & Plan Note (Signed)
I have reviewed multiple imaging studies with the patient and family Overall, she has clear objective positive response to treatment I recommend we resume chemotherapy for 3 more cycles I will order repeat echocardiogram prior to next treatment The plan will be to continue at least total of 6 cycles of treatment and to decide whether she needs further treatment beyond that

## 2020-06-11 NOTE — Telephone Encounter (Signed)
Scheduled per 12/14 los. Called and spoke with pt confirmed 12/28 appts

## 2020-06-11 NOTE — Assessment & Plan Note (Signed)
I reviewed imaging study with the patient The patient have clear signs of osteoarthritis on her back That is the most likely cause of her back pain and not from disease Recommend she continue conservative approach with over-the-counter analgesics for management of chronic back pain

## 2020-06-11 NOTE — Assessment & Plan Note (Signed)
We have extensive discussions about goals of care She is in agreement to proceed with treatment in 3 more cycles of therapy

## 2020-06-11 NOTE — Progress Notes (Signed)
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[facility]) I have reviewed multiple imaging studies with the patient and family Overall, she has clear objective positive response to treatment I recommend we resume chemotherapy for 3 more cycles I will order repeat echocardiogram prior to next treatment The plan will be to continue at least total of 6 cycles of treatment and to decide whether she needs further treatment beyond that  Lower back pain I reviewed imaging study with the patient The patient have clear signs of osteoarthritis on her back That is the most likely cause of her back pain and not from disease Recommend she continue conservative approach with over-the-counter analgesics for management of chronic back pain  Goals of care, counseling/discussion We have extensive discussions about goals of care She is in agreement to proceed with treatment in 3 more cycles of therapy   Orders Placed This Encounter  Procedures  . ECHOCARDIOGRAM COMPLETE    Standing Status:   Future    Standing Expiration Date:   06/11/2021    Order Specific Question:   Where should this test be performed    Answer:   [facility]    Order Specific Question:   Perflutren DEFINITY (image enhancing agent) should be administered unless hypersensitivity or allergy exist    Answer:   Administer Perflutren    Order Specific Question:   Reason for exam-Echo    Answer:   Chemotherapy evaluation  v87.41 / v58.11    All questions were answered. The patient knows to call the clinic with any problems, questions or concerns. The total time spent in the appointment was 30 minutes encounter with patients including review of chart and various tests results, discussions about plan of care and coordination of care plan   Artis Delay,  MD 06/11/2020 2:06 PM  INTERVAL HISTORY: Please see below for problem oriented charting. 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  She has bloating and early satiety. Shortness of breath with walking. She denied bleeding. She reported constipation with pain with defecation and narrowed stools   11/29/2018 Imaging   1. 13 cm complex cystic lesion in the central pelvis, highly suspicious for ovarian cystadenocarcinoma. 2. Diffuse peritoneal carcinomatosis with mild ascites. 3. Mild lymphadenopathy in porta hepatis and right cardiophrenic angle, suspicious for metastatic disease. 4. Moderate right and tiny left pleural effusions   12/05/2018 Tumor Marker   Patient's tumor was tested for the following markers: CA-125 Results of the tumor marker test revealed 1015.   12/06/2018 Cancer Staging   Staging form: Ovary, Fallopian Tube, and Primary Peritoneal Carcinoma, AJCC 8th Edition - Clinical: FIGO Stage IVA, calculated as Stage IV (cT3c, cN1, cM1) - Signed by Artis Delay, MD on 12/06/2018   12/09/2018 Pathology Results   PLEURAL FLUID, RIGHT (SPECIMEN 1 OF 1 COLLECTED 12/09/18): - MALIGNANT CELLS CONSISTENT WITH METASTATIC ADENOCARCINOMA - SEE COMMENT Comment The neoplastic cells are positive for cytokeratin 7 and  Pax-8 but negative for cytokeratin 20, TTF-1, CDX-2 and Gata-3. Overall, the phenotype is consistent with the clinical impression of gynecologic primary.    12/15/2018 Procedure   Placement of single lumen port a cath via right internal jugular vein. The catheter tip lies at the cavo-atrial  junction. A power injectable port a cath was placed and is ready for immediate use   12/16/2018 - 08/24/2019 Chemotherapy   The patient had carboplatin and taxol for chemotherapy treatment.  She had 7 cycles given neoadjuvant prior to surgery and 3 more cycles after surgery, for a total of 10 cycles of treatment    01/06/2019 Tumor Marker   Patient's tumor was tested for the following markers: CA-125 Results of the tumor marker test revealed 948   02/02/2019 Imaging   1. Massive pulmonary embolism, as discussed above. Given the mildly elevated RV to LV ratio of 0.95, this is associated with increased risk of morbidity and mortality. 2. Today's study demonstrates a mixed response to therapy. Specifically, while there has been regression of the bulky intraperitoneal metastatic disease and regression of previously noted pleural effusions, the large cystic mass in the central pelvis has increased in size compared to the prior study. 3. New right mild hydroureteronephrosis related to extrinsic compression on the distal third of the right ureter by the patient's large pelvic mass. 4. Scattered small pulmonary nodules (predominantly pleural based) appear stable compared to prior examinations. These are nonspecific but warrant continued attention on follow-up studies. 5. Aortic atherosclerosis, in addition to three-vessel coronary artery disease. Assessment for potential risk factor modification, dietary therapy or pharmacologic therapy may be warranted, if clinically indicated.   02/02/2019 - 02/04/2019 Hospital Admission   She was admitted the hospital due to DVT and PE   02/03/2019 Imaging   Bilateral venous Doppler US Right: Findings consistent with acute deep vein thrombosis involving the right femoral vein, right popliteal vein, right peroneal veins, right soleal veins, and right gastrocnemius veins. No cystic structure found in the popliteal fossa. Left: There is no evidence of deep vein thrombosis in the  lower extremity. No cystic structure found in the popliteal fossa   02/17/2019 Tumor Marker   Patient's tumor was tested for the following markers: CA-125 Results of the tumor marker test revealed 127   03/10/2019 Tumor Marker   Patient's tumor was tested for the following markers: CA-125. Results of the tumor marker test revealed 87.4   03/14/2019 - 03/16/2019 Hospital Admission   She was admitted to the hospital recently for weakness   03/31/2019 Tumor Marker   Patient's tumor was tested for the following markers: CA-125 Results of the tumor marker test revealed 59.2.   04/28/2019 Tumor Marker   Patient's tumor was tested for the following markers: CA-125 Results of the tumor marker test revealed 56.8   05/05/2019 Imaging   1. Interval decrease in size of the large cystic mass in the central pelvis. Mesenteric and omental soft tissue disease shows no substantial interval change. 2. The mild right hydroureteronephrosis seen previously has resolved in the interval. 3. Small residual nonobstructive thrombus identified in the inter lobar pulmonary artery common here into the lateral wall compatible with chronicity. 4. 14 mm subtle enhancing lesion in the anterior right liver is stable. This may be vascular malformation. Attention on follow-up recommended. 5. Stable appearance of the multiple small bilateral pulmonary nodules. Continued attention on follow-up recommended. 6.  Aortic Atherosclerois (ICD10-170.0)     06/06/2019 Pathology Results   A. OMENTUM, RESECTION: -  Metastatic carcinoma. B. RIGHT FALLOPIAN TUBE AND OVARY, SALPINGOOOPHORECTOMY: - High-grade serous carcinoma, spanning 9 cm. - No surface involvement identified. - Fallopian tube involved by carcinoma. - See oncology table. C. PERITONEAL NODULE, EXCISION: - Metastatic carcinoma. ONCOLOGY TABLE: OVARY or FALLOPIAN TUBE or PRIMARY PERITONEUM: Procedure: Right salpingo-oophorectomy, omental resection, and peritoneal  biopsy. Specimen Integrity: Intact Tumor Site: Right ovary Ovarian Surface Involvement (required only if applicable): Not identified Tumor Size: 9 cm Histologic Type: High-grade serous carcinoma Histologic Grade: High-grade Implants (required for advanced stage serous/seromucinous borderline tumors only): Omentum, peritoneum. Other Tissue/ Organ Involvement: Right fallopian tube Largest Extrapelvic Peritoneal Focus (required only if applicable): 7.3 cm Peritoneal/Ascitic Fluid: Positive pleural fluid pre neoadjuvant therapy. Treatment Effect (required only for high-grade serous carcinomas): Probable treatment effect present. Regional Lymph Nodes: No lymph nodes submitted or found Pathologic Stage Classification (pTNM, AJCC 8th Edition): ypT3c, ypNX Representative Tumor Block: B2   06/06/2019 Surgery   Preoperative Diagnosis: stage IV ovarian cancer, s/p neoadjuvant chemotherapy, history of recent PE.  Procedure(s) Performed: Exploratory laparotomy with right salpingo-oophorectomy, omentectomy radical tumor debulking for ovarian cancer .   Surgeon: Thereasa Solo, MD.    Operative Findings:  Omental cake adherent to anterior abdominal wall and hepatic flexure. 10cm right tube and ovary. Surgically absent uterus and left tube and ovary. Granular nodularity across right diaphragm.    This represented an optimal cytoreduction (R0) with no gross visible disease remaining.    07/06/2019 Tumor Marker   Patient's tumor was tested for the following markers:CA-125 Results of the tumor marker test revealed 16.4   08/10/2019 Genetic Testing   Negative genetic testing. No pathogenic variants identified. VUS in BRCA2 called [id number] identified on the [facility] CancerNext+RNAinsight panel. The report date is 08/10/2019.  The CancerNext+RNAinsight gene panel offered by [facility] includes sequencing and rearrangement analysis for the following 36 genes: APC*, ATM*, AXIN2, BARD1, BMPR1A, BRCA1*, BRCA2*,  BRIP1*, CDH1*, CDK4, CDKN2A, CHEK2*, DICER1, MLH1*, MSH2*, MSH3, MSH6*, MUTYH*, NBN, NF1*, NTHL1, PALB2*, PMS2*, PTEN*, RAD51C*, RAD51D*, RECQL, SMAD4, SMARCA4, STK11 and TP53* (sequencing and deletion/duplication); HOXB13, POLD1 and POLE (sequencing only); EPCAM and GREM1 (deletion/duplication only). DNA and RNA analyses performed for * genes.   HRD (somatic) testing was initially ordered and was not completed due to insufficient amount of tumor sample.    08/24/2019 Tumor Marker   Patient's tumor was tested for the following markers: CA-125 Results of the tumor marker test revealed 12.2   09/20/2019 Imaging   1. Interval debulking, bilateral salpingo oophorectomy and omentectomy with resection of the dominant pelvic cystic lesion and the bandlike soft tissue seen previously in the right mesentery/omentum. Today's study is status shows new postoperative baseline for follow-up. 2. 6 mm gastrohepatic ligament nodule seen on the previous study is stable. Continued attention on follow-up recommended. 3. No new or progressive findings in the chest, abdomen, or pelvis to suggest disease progression. 4. Stable 1.4 cm focus of homogeneous enhancement in the anterior right liver. Stable since at least 11/29/2018 and likely benign. Continued attention on follow-up recommended. 5. Aortic Atherosclerosis (ICD10-I70.0).   09/20/2019 Tumor Marker   Patient's tumor was tested for the following markers: CA-125 Results of the tumor marker test revealed 11.9   12/26/2019 Tumor Marker   Patient's tumor was tested for the following markers: CA-125 Results of the tumor marker test revealed 16.5   03/25/2020 Imaging   1. Interval progression of peritoneal disease which predominantly involves the serosal surface of the proximal and distal transverse colon, and descending colon.  2. New bilateral inguinal adenopathy. 3. No ascites. 4. Aortic atherosclerosis.   03/25/2020 Tumor Marker   Patient's tumor was tested for  the following markers: CA-125 Results of the tumor marker test revealed 249   03/28/2020 Echocardiogram    1. Borderline LV Strain; A2C view represents the most accurate acquisition.. Left ventricular ejection fraction, by estimation, is 60 to 65%. Left ventricular ejection fraction by PLAX is 63 %. The left ventricle has normal function. The left ventricle demonstrates regional wall motion abnormalities (see scoring diagram/findings for description). Left ventricular diastolic parameters were normal.  2. Right ventricular systolic function is normal. The right ventricular size is normal.  3. Left atrial size was mildly dilated.  4. The mitral valve is normal in structure. No evidence of mitral valve regurgitation.  5. The aortic valve is grossly normal. Aortic valve regurgitation is mild.  6. The inferior vena cava is normal in size with greater than 50% respiratory variability, suggesting right atrial pressure of 3 mmHg.   04/02/2020 Tumor Marker   Patient's tumor was tested for the following markers: CA-125 Results of the tumor marker test revealed 304   04/02/2020 -  Chemotherapy   The patient had carboplatin and Doxil for chemotherapy treatment.     05/27/2020 Tumor Marker   Patient's tumor was tested for the following markers: CA-125. Results of the tumor marker test revealed 42.7   06/10/2020 Imaging   Improving peritoneal disease and serosal implants along the left colon, as above.   Improving bilateral inguinal nodes.     REVIEW OF SYSTEMS:   Constitutional: Denies fevers, chills or abnormal weight loss Eyes: Denies blurriness of vision Ears, nose, mouth, throat, and face: Denies mucositis or sore throat Respiratory: Denies cough, dyspnea or wheezes Cardiovascular: Denies palpitation, chest discomfort or lower extremity swelling Gastrointestinal:  Denies nausea, heartburn or change in bowel habits Skin: Denies abnormal skin rashes Lymphatics: Denies new lymphadenopathy or  easy bruising Neurological:Denies numbness, tingling or new weaknesses Behavioral/Psych: Mood is stable, no new changes  All other systems were reviewed with the patient and are negative.  I have reviewed the past medical history, past surgical history, social history and family history with the patient and they are unchanged from previous note.  ALLERGIES:  has No Known Allergies.  MEDICATIONS:  Current Outpatient Medications  Medication Sig Dispense Refill  . acetaminophen (TYLENOL) 325 MG tablet Take 650 mg by mouth every 6 (six) hours as needed for moderate pain or headache.     Marland Kitchen aspirin EC 81 MG tablet Take 81 mg by mouth daily. Swallow whole.    . celecoxib (CELEBREX) 200 MG capsule celecoxib 200 mg capsule    . esomeprazole (NEXIUM) 40 MG capsule Take 40 mg by mouth 2 (two) times daily.     Marland Kitchen levothyroxine (SYNTHROID) 50 MCG tablet TAKE ONE TABLET EVERY MORNING (Patient taking differently: Take 50 mcg by mouth daily before breakfast. ) 30 tablet 9  . lidocaine-prilocaine (EMLA) cream Apply to affected area once (Patient taking differently: Apply 1 application topically daily as needed (port access). ) 30 g 3  . losartan (COZAAR) 50 MG tablet Take 50 mg by mouth daily.    . Melatonin 3 MG TABS Take by mouth.    . ondansetron (ZOFRAN) 8 MG tablet Take 8 mg by mouth every 8 (eight) hours as needed.    . prochlorperazine (COMPAZINE) 10 MG tablet Take 10 mg by mouth every 6 (six) hours as needed.    . SENEXON-S 8.6-50  MG tablet TAKE 1 TO 2 TABLETS AT BEDTIME FOR AFTER SURGERY (DO NOT TAKE IF HAVING DIARRHEA) 30 tablet 0   No current facility-administered medications for this visit.    PHYSICAL EXAMINATION: ECOG PERFORMANCE STATUS: 1 - Symptomatic but completely ambulatory  Vitals:   06/11/20 1327  BP: 139/64  Pulse: 74  Resp: 16  Temp: 98.1 F (36.7 C)  SpO2: 98%   Filed Weights   06/11/20 1327  Weight: 215 lb 9.6 oz (97.8 kg)    GENERAL:alert, no distress and  comfortable NEURO: alert & oriented x 3 with fluent speech, no focal motor/sensory deficits  LABORATORY DATA:  I have reviewed the data as listed    Component Value Date/Time   NA 139 05/27/2020 1145   K 4.2 05/27/2020 1145   CL 104 05/27/2020 1145   CO2 25 05/27/2020 1145   GLUCOSE 95 05/27/2020 1145   BUN 15 05/27/2020 1145   CREATININE 1.08 (H) 05/27/2020 1145   CREATININE 1.12 (H) 04/30/2020 1212   CALCIUM 9.9 05/27/2020 1145   PROT 7.0 05/27/2020 1145   ALBUMIN 3.7 05/27/2020 1145   AST 19 05/27/2020 1145   AST 18 04/30/2020 1212   ALT 18 05/27/2020 1145   ALT 20 04/30/2020 1212   ALKPHOS 77 05/27/2020 1145   BILITOT 0.2 (L) 05/27/2020 1145   BILITOT <0.2 (L) 04/30/2020 1212   GFRNONAA 53 (L) 05/27/2020 1145   GFRNONAA 51 (L) 04/30/2020 1212   GFRAA >60 03/25/2020 1144    No results found for: SPEP, UPEP  Lab Results  Component Value Date   WBC 3.8 (L) 05/27/2020   NEUTROABS 1.4 (L) 05/27/2020   HGB 10.9 (L) 05/27/2020   HCT 32.1 (L) 05/27/2020   MCV 102.2 (H) 05/27/2020   PLT 230 05/27/2020      Chemistry      Component Value Date/Time   NA 139 05/27/2020 1145   K 4.2 05/27/2020 1145   CL 104 05/27/2020 1145   CO2 25 05/27/2020 1145   BUN 15 05/27/2020 1145   CREATININE 1.08 (H) 05/27/2020 1145   CREATININE 1.12 (H) 04/30/2020 1212      Component Value Date/Time   CALCIUM 9.9 05/27/2020 1145   ALKPHOS 77 05/27/2020 1145   AST 19 05/27/2020 1145   AST 18 04/30/2020 1212   ALT 18 05/27/2020 1145   ALT 20 04/30/2020 1212   BILITOT 0.2 (L) 05/27/2020 1145   BILITOT <0.2 (L) 04/30/2020 1212       RADIOGRAPHIC STUDIES: I have reviewed multiple imaging studies with the patient and family I have personally reviewed the radiological images as listed and agreed with the findings in the report. CT ABDOMEN PELVIS W CONTRAST  Result Date: 06/10/2020 CLINICAL DATA:  Ovarian cancer, status post TAH and chemotherapy EXAM: CT ABDOMEN AND PELVIS WITH  CONTRAST TECHNIQUE: Multidetector CT imaging of the abdomen and pelvis was performed using the standard protocol following bolus administration of intravenous contrast. CONTRAST:  149mL OMNIPAQUE IOHEXOL 300 MG/ML  SOLN COMPARISON:  03/25/2020 FINDINGS: Lower chest: Linear scarring/atelectasis in the left lung base. Hepatobiliary: 12 mm hypervascular lesion in the central right hepatic lobe (series 2/image 25), unchanged. This is been present on multiple priors and is not considered suspicious for metastasis. Distended gallbladder, without associated inflammatory changes. No intrahepatic or extrahepatic ductal dilatation. Pancreas: Within normal limits. Spleen: Within normal limits. Adrenals/Urinary Tract: Adrenal glands are within normal limits. Kidneys are within normal limits.  No hydronephrosis. Bladder is within normal limits. Stomach/Bowel: Stomach  is within normal limits. No evidence of bowel obstruction. 13 mm serosal implant along the proximal descending colon in the left upper abdomen (series 2/image 35), previously 19 mm. Additional faint serosal disease in the left upper abdomen (series 2/images 33), almost completely resolved. Vascular/Lymphatic: No evidence of abdominal aortic aneurysm. Atherosclerotic calcifications of the abdominal aorta and branch vessels. 10 mm short axis left inguinal node (series 2/image 74), previously 16 mm short axis. 9 mm short axis right inguinal node, previously 13 mm. Otherwise, no suspicious abdominopelvic lymphadenopathy. Reproductive: Status post hysterectomy and right salpingo-oophorectomy. Left ovary is notable for a stable 16 mm cystic lesion (series 2/image 59). Other: No abdominopelvic ascites. Postsurgical changes along the anterior abdominal wall. Very subtle peritoneal nodularity beneath the right mid anterior abdominal wall (series 2/image 45) and in the left mid abdomen (series 2/image 40), improved. Musculoskeletal: Mild degenerative changes of the visualized  thoracolumbar spine. IMPRESSION: Improving peritoneal disease and serosal implants along the left colon, as above. Improving bilateral inguinal nodes. Electronically Signed   By: Julian Hy M.D.   On: 06/10/2020 08:43

## 2020-06-11 NOTE — Telephone Encounter (Signed)
Per Dr.Gorsuch, scheduled appt for ECHO. Pt called and made aware of date & time. Pt verbalized understanding

## 2020-06-18 ENCOUNTER — Ambulatory Visit (HOSPITAL_COMMUNITY)
Admission: RE | Admit: 2020-06-18 | Discharge: 2020-06-18 | Disposition: A | Discharge status: Home / Self Care | Payer: Medicare Other | Source: Ambulatory Visit | Attending: Hematology and Oncology | Admitting: Hematology and Oncology

## 2020-06-18 ENCOUNTER — Other Ambulatory Visit: Payer: Self-pay

## 2020-06-18 ENCOUNTER — Telehealth: Payer: Self-pay

## 2020-06-18 DIAGNOSIS — C561 Malignant neoplasm of right ovary: Secondary | ICD-10-CM | POA: Insufficient documentation

## 2020-06-18 DIAGNOSIS — I083 Combined rheumatic disorders of mitral, aortic and tricuspid valves: Secondary | ICD-10-CM | POA: Insufficient documentation

## 2020-06-18 DIAGNOSIS — Z0189 Encounter for other specified special examinations: Secondary | ICD-10-CM | POA: Diagnosis not present

## 2020-06-18 DIAGNOSIS — T451X5A Adverse effect of antineoplastic and immunosuppressive drugs, initial encounter: Secondary | ICD-10-CM

## 2020-06-18 DIAGNOSIS — I2699 Other pulmonary embolism without acute cor pulmonale: Secondary | ICD-10-CM

## 2020-06-18 LAB — ECHOCARDIOGRAM COMPLETE
Area-P 1/2: 6.32 cm2
Calc EF: 68.8 %
P 1/2 time: 464 msec
S' Lateral: 2.6 cm
Single Plane A2C EF: 71.3 %
Single Plane A4C EF: 63.9 %

## 2020-06-18 NOTE — Telephone Encounter (Signed)
-----   Message from [name], MD sent at 06/18/2020  1:47 PM EST ----- Regarding: non urgent Pls let her know echo is nromal

## 2020-06-18 NOTE — Telephone Encounter (Signed)
Called and given below message. She verbalized understanding. 

## 2020-06-18 NOTE — Progress Notes (Signed)
  Echocardiogram 2D Echocardiogram has been performed.  Paula Peterson 06/18/2020, 9:52 AM

## 2020-06-25 ENCOUNTER — Inpatient Hospital Stay: Payer: Medicare Other

## 2020-06-25 ENCOUNTER — Inpatient Hospital Stay (HOSPITAL_BASED_OUTPATIENT_CLINIC_OR_DEPARTMENT_OTHER): Payer: Medicare Other

## 2020-06-25 ENCOUNTER — Other Ambulatory Visit: Payer: Self-pay

## 2020-06-25 ENCOUNTER — Other Ambulatory Visit: Payer: Self-pay | Admitting: Oncology

## 2020-06-25 VITALS — BP 162/71 | HR 88 | Temp 97.9°F | Resp 16

## 2020-06-25 DIAGNOSIS — C569 Malignant neoplasm of unspecified ovary: Secondary | ICD-10-CM

## 2020-06-25 DIAGNOSIS — C561 Malignant neoplasm of right ovary: Secondary | ICD-10-CM

## 2020-06-25 DIAGNOSIS — M545 Low back pain, unspecified: Secondary | ICD-10-CM | POA: Diagnosis not present

## 2020-06-25 DIAGNOSIS — G8929 Other chronic pain: Secondary | ICD-10-CM | POA: Diagnosis not present

## 2020-06-25 DIAGNOSIS — Z7189 Other specified counseling: Secondary | ICD-10-CM

## 2020-06-25 DIAGNOSIS — Z5111 Encounter for antineoplastic chemotherapy: Secondary | ICD-10-CM | POA: Diagnosis not present

## 2020-06-25 LAB — CMP (CANCER CENTER ONLY)
ALT: 19 U/L (ref 0–44)
AST: 18 U/L (ref 15–41)
Albumin: 3.6 g/dL (ref 3.5–5.0)
Alkaline Phosphatase: 76 U/L (ref 38–126)
Anion gap: 8 (ref 5–15)
BUN: 18 mg/dL (ref 8–23)
CO2: 25 mmol/L (ref 22–32)
Calcium: 9.5 mg/dL (ref 8.9–10.3)
Chloride: 105 mmol/L (ref 98–111)
Creatinine: 1.02 mg/dL — ABNORMAL HIGH (ref 0.44–1.00)
GFR, Estimated: 57 mL/min — ABNORMAL LOW (ref >60)
Glucose, Bld: 128 mg/dL — ABNORMAL HIGH (ref 70–99)
Potassium: 3.7 mmol/L (ref 3.5–5.1)
Sodium: 138 mmol/L (ref 135–145)
Total Bilirubin: 0.3 mg/dL (ref 0.3–1.2)
Total Protein: 6.7 g/dL (ref 6.5–8.1)

## 2020-06-25 LAB — CBC WITH DIFFERENTIAL (CANCER CENTER ONLY)
Abs Immature Granulocytes: 0 10*3/uL (ref 0.00–0.07)
Basophils Absolute: 0 10*3/uL (ref 0.0–0.1)
Basophils Relative: 0 %
Eosinophils Absolute: 0 10*3/uL (ref 0.0–0.5)
Eosinophils Relative: 1 %
HCT: 28.2 % — ABNORMAL LOW (ref 36.0–46.0)
Hemoglobin: 9.6 g/dL — ABNORMAL LOW (ref 12.0–15.0)
Immature Granulocytes: 0 %
Lymphocytes Relative: 49 %
Lymphs Abs: 1.7 10*3/uL (ref 0.7–4.0)
MCH: 36.2 pg — ABNORMAL HIGH (ref 26.0–34.0)
MCHC: 34 g/dL (ref 30.0–36.0)
MCV: 106.4 fL — ABNORMAL HIGH (ref 80.0–100.0)
Monocytes Absolute: 0.5 10*3/uL (ref 0.1–1.0)
Monocytes Relative: 13 %
Neutro Abs: 1.3 10*3/uL — ABNORMAL LOW (ref 1.7–7.7)
Neutrophils Relative %: 37 %
Platelet Count: 136 10*3/uL — ABNORMAL LOW (ref 150–400)
RBC: 2.65 MIL/uL — ABNORMAL LOW (ref 3.87–5.11)
RDW: 19.2 % — ABNORMAL HIGH (ref 11.5–15.5)
WBC Count: 3.5 10*3/uL — ABNORMAL LOW (ref 4.0–10.5)
nRBC: 0.6 % — ABNORMAL HIGH (ref 0.0–0.2)

## 2020-06-25 MED ORDER — DIPHENHYDRAMINE HCL 50 MG/ML IJ SOLN
25.0000 mg | Freq: Once | INTRAMUSCULAR | Status: AC
  Administered 2020-06-25: 14:00:00 25 mg via INTRAVENOUS

## 2020-06-25 MED ORDER — PALONOSETRON HCL INJECTION 0.25 MG/5ML
INTRAVENOUS | Status: AC
  Filled 2020-06-25: qty 5

## 2020-06-25 MED ORDER — DEXTROSE 5 % IV SOLN
Freq: Once | INTRAVENOUS | Status: AC
  Filled 2020-06-25: qty 250

## 2020-06-25 MED ORDER — HEPARIN SOD (PORK) LOCK FLUSH 100 UNIT/ML IV SOLN
500.0000 [IU] | Freq: Once | INTRAVENOUS | Status: AC | PRN
  Administered 2020-06-25: 17:00:00 500 [IU]
  Filled 2020-06-25: qty 5

## 2020-06-25 MED ORDER — FAMOTIDINE IN NACL 20-0.9 MG/50ML-% IV SOLN
20.0000 mg | Freq: Once | INTRAVENOUS | Status: AC
  Administered 2020-06-25: 15:00:00 20 mg via INTRAVENOUS

## 2020-06-25 MED ORDER — PALONOSETRON HCL INJECTION 0.25 MG/5ML
0.2500 mg | Freq: Once | INTRAVENOUS | Status: AC
  Administered 2020-06-25: 14:00:00 0.25 mg via INTRAVENOUS

## 2020-06-25 MED ORDER — DIPHENHYDRAMINE HCL 50 MG/ML IJ SOLN
INTRAMUSCULAR | Status: AC
  Filled 2020-06-25: qty 1

## 2020-06-25 MED ORDER — DOXORUBICIN HCL LIPOSOMAL CHEMO INJECTION 2 MG/ML
28.0000 mg/m2 | Freq: Once | INTRAVENOUS | Status: AC
  Administered 2020-06-25: 15:00:00 60 mg via INTRAVENOUS
  Filled 2020-06-25: qty 30

## 2020-06-25 MED ORDER — DEXAMETHASONE SODIUM PHOSPHATE 100 MG/10ML IJ SOLN
10.0000 mg | Freq: Once | INTRAMUSCULAR | Status: AC
  Administered 2020-06-25: 14:00:00 10 mg via INTRAVENOUS
  Filled 2020-06-25: qty 10

## 2020-06-25 MED ORDER — SODIUM CHLORIDE 0.9 % IV SOLN
383.2000 mg | Freq: Once | INTRAVENOUS | Status: AC
  Administered 2020-06-25: 16:00:00 380 mg via INTRAVENOUS
  Filled 2020-06-25: qty 38

## 2020-06-25 MED ORDER — FAMOTIDINE IN NACL 20-0.9 MG/50ML-% IV SOLN
INTRAVENOUS | Status: AC
  Filled 2020-06-25: qty 50

## 2020-06-25 MED ORDER — SODIUM CHLORIDE 0.9 % IV SOLN
150.0000 mg | Freq: Once | INTRAVENOUS | Status: AC
  Administered 2020-06-25: 14:00:00 150 mg via INTRAVENOUS
  Filled 2020-06-25: qty 150

## 2020-06-25 MED ORDER — SODIUM CHLORIDE 0.9% FLUSH
10.0000 mL | Freq: Once | INTRAVENOUS | Status: AC
  Administered 2020-06-25: 13:00:00 10 mL
  Filled 2020-06-25: qty 10

## 2020-06-25 MED ORDER — SODIUM CHLORIDE 0.9% FLUSH
10.0000 mL | INTRAVENOUS | Status: DC | PRN
  Administered 2020-06-25: 17:00:00 10 mL
  Filled 2020-06-25: qty 10

## 2020-06-25 NOTE — Progress Notes (Signed)
Per Dr. Truett Perna, ok to proceed with treatment today in spite on low ANC 1.3. Will schedule patient for repeat CBC at the end of next week.

## 2020-06-25 NOTE — Patient Instructions (Signed)
[facility] Discharge Instructions for Patients Receiving Chemotherapy  Today you received the following chemotherapy agents: Doxil and carboplatin.  To help prevent nausea and vomiting after your treatment, we encourage you to take your nausea medication as directed.   If you develop nausea and vomiting that is not controlled by your nausea medication, call the clinic.   BELOW ARE SYMPTOMS THAT SHOULD BE REPORTED IMMEDIATELY:  *FEVER GREATER THAN 100.5 F  *CHILLS WITH OR WITHOUT FEVER  NAUSEA AND VOMITING THAT IS NOT CONTROLLED WITH YOUR NAUSEA MEDICATION  *UNUSUAL SHORTNESS OF BREATH  *UNUSUAL BRUISING OR BLEEDING  TENDERNESS IN MOUTH AND THROAT WITH OR WITHOUT PRESENCE OF ULCERS  *URINARY PROBLEMS  *BOWEL PROBLEMS  UNUSUAL RASH Items with * indicate a potential emergency and should be followed up as soon as possible.  Feel free to call the clinic should you have any questions or concerns. The clinic phone number is [phone].  Please show the CHEMO ALERT CARD at check-in to the Emergency Department and triage nurse.

## 2020-06-26 LAB — CA 125: Cancer Antigen (CA) 125: 23.9 U/mL (ref 0.0–38.1)

## 2020-07-04 ENCOUNTER — Inpatient Hospital Stay: Payer: Medicare Other | Attending: Gynecologic Oncology

## 2020-07-04 ENCOUNTER — Telehealth: Payer: Self-pay

## 2020-07-04 ENCOUNTER — Other Ambulatory Visit: Payer: Self-pay

## 2020-07-04 DIAGNOSIS — I2699 Other pulmonary embolism without acute cor pulmonale: Secondary | ICD-10-CM | POA: Insufficient documentation

## 2020-07-04 DIAGNOSIS — Z7901 Long term (current) use of anticoagulants: Secondary | ICD-10-CM | POA: Insufficient documentation

## 2020-07-04 DIAGNOSIS — D61818 Other pancytopenia: Secondary | ICD-10-CM | POA: Insufficient documentation

## 2020-07-04 DIAGNOSIS — C561 Malignant neoplasm of right ovary: Secondary | ICD-10-CM | POA: Insufficient documentation

## 2020-07-04 DIAGNOSIS — Z5111 Encounter for antineoplastic chemotherapy: Secondary | ICD-10-CM | POA: Diagnosis not present

## 2020-07-04 DIAGNOSIS — R5383 Other fatigue: Secondary | ICD-10-CM | POA: Insufficient documentation

## 2020-07-04 LAB — CBC WITH DIFFERENTIAL (CANCER CENTER ONLY)
Abs Immature Granulocytes: 0.01 10*3/uL (ref 0.00–0.07)
Basophils Absolute: 0 10*3/uL (ref 0.0–0.1)
Basophils Relative: 1 %
Eosinophils Absolute: 0 10*3/uL (ref 0.0–0.5)
Eosinophils Relative: 0 %
HCT: 28.2 % — ABNORMAL LOW (ref 36.0–46.0)
Hemoglobin: 10 g/dL — ABNORMAL LOW (ref 12.0–15.0)
Immature Granulocytes: 0 %
Lymphocytes Relative: 55 %
Lymphs Abs: 1.7 10*3/uL (ref 0.7–4.0)
MCH: 38.2 pg — ABNORMAL HIGH (ref 26.0–34.0)
MCHC: 35.5 g/dL (ref 30.0–36.0)
MCV: 107.6 fL — ABNORMAL HIGH (ref 80.0–100.0)
Monocytes Absolute: 0.3 10*3/uL (ref 0.1–1.0)
Monocytes Relative: 11 %
Neutro Abs: 1 10*3/uL — ABNORMAL LOW (ref 1.7–7.7)
Neutrophils Relative %: 33 %
Platelet Count: 213 10*3/uL (ref 150–400)
RBC: 2.62 MIL/uL — ABNORMAL LOW (ref 3.87–5.11)
RDW: 17.7 % — ABNORMAL HIGH (ref 11.5–15.5)
WBC Count: 3 10*3/uL — ABNORMAL LOW (ref 4.0–10.5)
nRBC: 0 % (ref 0.0–0.2)

## 2020-07-04 NOTE — Telephone Encounter (Signed)
-----   Message from Artis Delay, MD sent at 07/04/2020  1:39 PM EST ----- Pls let her know CBC is ok ----- Message ----- From: Ladene Artist, MD Sent: 07/04/2020   1:37 PM EST To: Artis Delay, MD   ----- Message ----- From: Leory Plowman, Lab In [place] Sent: 07/04/2020  12:02 PM EST To: Ladene Artist, MD

## 2020-07-04 NOTE — Telephone Encounter (Signed)
Called and given below message. She verbalized understanding. 

## 2020-07-09 DIAGNOSIS — J329 Chronic sinusitis, unspecified: Secondary | ICD-10-CM | POA: Diagnosis not present

## 2020-07-15 ENCOUNTER — Inpatient Hospital Stay (HOSPITAL_COMMUNITY)
Admission: EM | Admit: 2020-07-15 | Discharge: 2020-07-17 | DRG: 176 | Disposition: A | Discharge status: Home / Self Care | Payer: Medicare Other | Attending: Family Medicine | Admitting: Family Medicine

## 2020-07-15 ENCOUNTER — Emergency Department (HOSPITAL_COMMUNITY): Payer: Medicare Other

## 2020-07-15 ENCOUNTER — Other Ambulatory Visit: Payer: Self-pay

## 2020-07-15 ENCOUNTER — Encounter (HOSPITAL_COMMUNITY): Payer: Self-pay

## 2020-07-15 DIAGNOSIS — Z800 Family history of malignant neoplasm of digestive organs: Secondary | ICD-10-CM

## 2020-07-15 DIAGNOSIS — R079 Chest pain, unspecified: Secondary | ICD-10-CM | POA: Diagnosis not present

## 2020-07-15 DIAGNOSIS — Z8673 Personal history of transient ischemic attack (TIA), and cerebral infarction without residual deficits: Secondary | ICD-10-CM

## 2020-07-15 DIAGNOSIS — C561 Malignant neoplasm of right ovary: Secondary | ICD-10-CM | POA: Diagnosis present

## 2020-07-15 DIAGNOSIS — Z79899 Other long term (current) drug therapy: Secondary | ICD-10-CM | POA: Diagnosis not present

## 2020-07-15 DIAGNOSIS — D61818 Other pancytopenia: Secondary | ICD-10-CM | POA: Diagnosis not present

## 2020-07-15 DIAGNOSIS — R071 Chest pain on breathing: Secondary | ICD-10-CM | POA: Diagnosis not present

## 2020-07-15 DIAGNOSIS — E876 Hypokalemia: Secondary | ICD-10-CM | POA: Diagnosis present

## 2020-07-15 DIAGNOSIS — I351 Nonrheumatic aortic (valve) insufficiency: Secondary | ICD-10-CM | POA: Diagnosis not present

## 2020-07-15 DIAGNOSIS — R0781 Pleurodynia: Secondary | ICD-10-CM | POA: Diagnosis present

## 2020-07-15 DIAGNOSIS — I2699 Other pulmonary embolism without acute cor pulmonale: Principal | ICD-10-CM | POA: Diagnosis present

## 2020-07-15 DIAGNOSIS — Z86718 Personal history of other venous thrombosis and embolism: Secondary | ICD-10-CM | POA: Diagnosis not present

## 2020-07-15 DIAGNOSIS — D509 Iron deficiency anemia, unspecified: Secondary | ICD-10-CM | POA: Diagnosis present

## 2020-07-15 DIAGNOSIS — G8929 Other chronic pain: Secondary | ICD-10-CM | POA: Diagnosis not present

## 2020-07-15 DIAGNOSIS — R0602 Shortness of breath: Secondary | ICD-10-CM

## 2020-07-15 DIAGNOSIS — Z7982 Long term (current) use of aspirin: Secondary | ICD-10-CM

## 2020-07-15 DIAGNOSIS — Z20822 Contact with and (suspected) exposure to covid-19: Secondary | ICD-10-CM | POA: Diagnosis not present

## 2020-07-15 DIAGNOSIS — Z8543 Personal history of malignant neoplasm of ovary: Secondary | ICD-10-CM | POA: Diagnosis not present

## 2020-07-15 DIAGNOSIS — Z79890 Hormone replacement therapy (postmenopausal): Secondary | ICD-10-CM | POA: Diagnosis not present

## 2020-07-15 DIAGNOSIS — J90 Pleural effusion, not elsewhere classified: Secondary | ICD-10-CM | POA: Diagnosis not present

## 2020-07-15 DIAGNOSIS — Z90710 Acquired absence of both cervix and uterus: Secondary | ICD-10-CM

## 2020-07-15 DIAGNOSIS — E039 Hypothyroidism, unspecified: Secondary | ICD-10-CM | POA: Diagnosis not present

## 2020-07-15 DIAGNOSIS — I2694 Multiple subsegmental pulmonary emboli without acute cor pulmonale: Secondary | ICD-10-CM | POA: Diagnosis not present

## 2020-07-15 DIAGNOSIS — D6959 Other secondary thrombocytopenia: Secondary | ICD-10-CM | POA: Diagnosis present

## 2020-07-15 DIAGNOSIS — I2782 Chronic pulmonary embolism: Secondary | ICD-10-CM | POA: Diagnosis not present

## 2020-07-15 DIAGNOSIS — I10 Essential (primary) hypertension: Secondary | ICD-10-CM | POA: Diagnosis present

## 2020-07-15 DIAGNOSIS — K219 Gastro-esophageal reflux disease without esophagitis: Secondary | ICD-10-CM | POA: Diagnosis not present

## 2020-07-15 DIAGNOSIS — Z743 Need for continuous supervision: Secondary | ICD-10-CM | POA: Diagnosis not present

## 2020-07-15 DIAGNOSIS — M549 Dorsalgia, unspecified: Secondary | ICD-10-CM | POA: Diagnosis not present

## 2020-07-15 DIAGNOSIS — K5909 Other constipation: Secondary | ICD-10-CM | POA: Diagnosis present

## 2020-07-15 DIAGNOSIS — R0789 Other chest pain: Secondary | ICD-10-CM | POA: Diagnosis not present

## 2020-07-15 LAB — CBC WITH DIFFERENTIAL/PLATELET
Abs Immature Granulocytes: 0.01 10*3/uL (ref 0.00–0.07)
Basophils Absolute: 0 10*3/uL (ref 0.0–0.1)
Basophils Relative: 1 %
Eosinophils Absolute: 0 10*3/uL (ref 0.0–0.5)
Eosinophils Relative: 0 %
HCT: 30.8 % — ABNORMAL LOW (ref 36.0–46.0)
Hemoglobin: 10.3 g/dL — ABNORMAL LOW (ref 12.0–15.0)
Immature Granulocytes: 0 %
Lymphocytes Relative: 42 %
Lymphs Abs: 1.8 10*3/uL (ref 0.7–4.0)
MCH: 38 pg — ABNORMAL HIGH (ref 26.0–34.0)
MCHC: 33.4 g/dL (ref 30.0–36.0)
MCV: 113.7 fL — ABNORMAL HIGH (ref 80.0–100.0)
Monocytes Absolute: 0.8 10*3/uL (ref 0.1–1.0)
Monocytes Relative: 19 %
Neutro Abs: 1.6 10*3/uL — ABNORMAL LOW (ref 1.7–7.7)
Neutrophils Relative %: 38 %
Platelets: 95 10*3/uL — ABNORMAL LOW (ref 150–400)
RBC: 2.71 MIL/uL — ABNORMAL LOW (ref 3.87–5.11)
RDW: 16.9 % — ABNORMAL HIGH (ref 11.5–15.5)
WBC: 4.3 10*3/uL (ref 4.0–10.5)
nRBC: 0.5 % — ABNORMAL HIGH (ref 0.0–0.2)

## 2020-07-15 LAB — COMPREHENSIVE METABOLIC PANEL
ALT: 24 U/L (ref 0–44)
AST: 25 U/L (ref 15–41)
Albumin: 3.9 g/dL (ref 3.5–5.0)
Alkaline Phosphatase: 79 U/L (ref 38–126)
Anion gap: 9 (ref 5–15)
BUN: 16 mg/dL (ref 8–23)
CO2: 26 mmol/L (ref 22–32)
Calcium: 9.6 mg/dL (ref 8.9–10.3)
Chloride: 101 mmol/L (ref 98–111)
Creatinine, Ser: 0.88 mg/dL (ref 0.44–1.00)
GFR, Estimated: >60 mL/min (ref >60)
Glucose, Bld: 113 mg/dL — ABNORMAL HIGH (ref 70–99)
Potassium: 3.4 mmol/L — ABNORMAL LOW (ref 3.5–5.1)
Sodium: 136 mmol/L (ref 135–145)
Total Bilirubin: 0.4 mg/dL (ref 0.3–1.2)
Total Protein: 7.6 g/dL (ref 6.5–8.1)

## 2020-07-15 LAB — RESP PANEL BY RT-PCR (FLU A&B, COVID) ARPGX2
Influenza A by PCR: NEGATIVE
Influenza B by PCR: NEGATIVE
SARS Coronavirus 2 by RT PCR: NEGATIVE

## 2020-07-15 LAB — TROPONIN I (HIGH SENSITIVITY)
Troponin I (High Sensitivity): 7 ng/L (ref <18)
Troponin I (High Sensitivity): 8 ng/L (ref <18)

## 2020-07-15 MED ORDER — POTASSIUM CHLORIDE 20 MEQ PO PACK
20.0000 meq | PACK | Freq: Once | ORAL | Status: AC
  Administered 2020-07-15: 20 meq via ORAL
  Filled 2020-07-15: qty 1

## 2020-07-15 MED ORDER — LOSARTAN POTASSIUM 50 MG PO TABS
50.0000 mg | ORAL_TABLET | Freq: Every day | ORAL | Status: DC
Start: 2020-07-15 — End: 2020-07-16
  Administered 2020-07-16: 50 mg via ORAL
  Filled 2020-07-15: qty 1

## 2020-07-15 MED ORDER — BISACODYL 5 MG PO TBEC
5.0000 mg | DELAYED_RELEASE_TABLET | Freq: Every day | ORAL | Status: DC
  Administered 2020-07-15: 5 mg via ORAL
  Filled 2020-07-15: qty 1

## 2020-07-15 MED ORDER — ONDANSETRON HCL 4 MG PO TABS: 4.0000 mg | ORAL_TABLET | Freq: Four times a day (QID) | ORAL | Status: DC | PRN

## 2020-07-15 MED ORDER — ONDANSETRON HCL 4 MG/2ML IJ SOLN: 4.0000 mg | Freq: Four times a day (QID) | INTRAMUSCULAR | Status: DC | PRN

## 2020-07-15 MED ORDER — IOHEXOL 350 MG/ML SOLN
100.0000 mL | Freq: Once | INTRAVENOUS | Status: AC | PRN
  Administered 2020-07-15: 100 mL via INTRAVENOUS

## 2020-07-15 MED ORDER — ACETAMINOPHEN 650 MG RE SUPP: 650.0000 mg | Freq: Four times a day (QID) | RECTAL | Status: DC | PRN

## 2020-07-15 MED ORDER — HEPARIN (PORCINE) 25000 UT/250ML-% IV SOLN
1500.0000 [IU]/h | INTRAVENOUS | Status: DC
  Administered 2020-07-15: 1400 [IU]/h via INTRAVENOUS
  Filled 2020-07-15 (×2): qty 250

## 2020-07-15 MED ORDER — SENNOSIDES-DOCUSATE SODIUM 8.6-50 MG PO TABS: 1.0000 | ORAL_TABLET | Freq: Every evening | ORAL | Status: DC | PRN

## 2020-07-15 MED ORDER — OXYCODONE HCL 5 MG PO TABS: 5.0000 mg | ORAL_TABLET | ORAL | Status: DC | PRN

## 2020-07-15 MED ORDER — ACETAMINOPHEN 325 MG PO TABS
650.0000 mg | ORAL_TABLET | Freq: Four times a day (QID) | ORAL | Status: DC | PRN
  Administered 2020-07-16: 650 mg via ORAL
  Filled 2020-07-15: qty 2

## 2020-07-15 MED ORDER — ASPIRIN EC 81 MG PO TBEC
81.0000 mg | DELAYED_RELEASE_TABLET | Freq: Every day | ORAL | Status: DC
Start: 2020-07-15 — End: 2020-07-16
  Administered 2020-07-16: 81 mg via ORAL
  Filled 2020-07-15: qty 1

## 2020-07-15 MED ORDER — LEVOTHYROXINE SODIUM 50 MCG PO TABS
50.0000 ug | ORAL_TABLET | Freq: Every day | ORAL | Status: DC
  Administered 2020-07-16: 50 ug via ORAL
  Filled 2020-07-15: qty 1

## 2020-07-15 MED ORDER — MORPHINE SULFATE (PF) 2 MG/ML IV SOLN
2.0000 mg | INTRAVENOUS | Status: DC | PRN
  Administered 2020-07-15: 2 mg via INTRAVENOUS
  Filled 2020-07-15: qty 1

## 2020-07-15 MED ORDER — MELATONIN 3 MG PO TABS
5.0000 mg | ORAL_TABLET | Freq: Every evening | ORAL | Status: DC | PRN
Start: 2020-07-15 — End: 2020-07-17
  Administered 2020-07-15 – 2020-07-16 (×2): 4.5 mg via ORAL
  Filled 2020-07-15 (×2): qty 2

## 2020-07-15 NOTE — ED Provider Notes (Signed)
[facility] Provider Note   CSN: [id number] Arrival date & time: 07/15/20  1641     History Chief Complaint  Patient presents with  . Chest Pain    Paula Peterson is a 80 y.o. female with past medical history significant for ovarian cancer, pancytopenia, GERD, HTN, CVA, DVT, and PE not on anticoagulation who presents the ED via EMS for sharp left-sided chest pain worse with inspiration.  Patient took 324 mg aspirin PTA.    On my examination, patient reports that she has a history of chronic back pain.  However, she developed left-sided thoracic back pain last night suddenly that was worse than her typical back pain.  She also developed some shortness of breath, particularly with exertion and talking.  This morning she felt as though she still had that left-sided thoracic region back pain, but she is now also experiencing sharp chest pains underneath her left breast.  She states that this pain is exacerbated with inspiration and her shortness of breath symptoms are worsening.  I observed patient ambulate to and from the bathroom and she became particularly short of breath with increased work of breathing.  Her vital signs while at rest are largely unremarkable and without any significant tachycardia or tachypnea.  She states that she was only on Eliquis for approximately 1 year after her DVT and PE.  She states that while there is no obvious explanation for her PE, they suspected that it was because she was "not walking enough".  She denies any nausea, diaphoresis, abdominal pain, recent illness or infection, fevers, chills, hemoptysis, recent unilateral extremity swelling or edema, or other symptoms.  She tried taking her Zantac at home given concern that this was possibly GERD related, but there was no relief of her symptoms.  I reviewed patient's medical record and she was last evaluated by her oncologist Dr. Alvy Bimler on 06/11/2020 for her right ovarian epithelial cancer.   Plan is for chemotherapy treatments.   Timeline: Ovarian cancer diagnosis in 09/2018.  Massive pulmonary embolism 02/02/2019.  She is s/p right-sided salpingo-oophorectomy and omentum resection 06/06/2019.  Most recent chemotherapy infusion 06/25/2020.     HPI   Past Medical History:  Diagnosis Date  . Back pain   . DVT (deep venous thrombosis) ([place]) 02/02/2019   had DVT first that moved into lung  . Family history of stomach cancer   . Family history of stomach cancer   . GERD (gastroesophageal reflux disease)   . History of pulmonary embolus (PE) 02/02/2019   confirmed by CT chest- had DVT first that moved into lungs per patient  . Hypertension   . Pessary maintenance   . Stroke [facility])     Patient Active Problem List   Diagnosis Date Noted  . Lower back pain 05/28/2020  . Elevated serum creatinine 05/01/2020  . Recurrent UTI 04/03/2020  . Genetic testing 08/11/2019  . Adverse effect of chemotherapy 03/14/2019  . Pancytopenia, acquired ([place]) 03/10/2019  . Peripheral neuropathy due to chemotherapy ([place]) 03/10/2019  . Epigastric pain 02/09/2019  . PE (pulmonary thromboembolism) ([place]) 02/02/2019  . Chronic hip pain 01/27/2019  . Acquired hypothyroidism 01/27/2019  . Family history of stomach cancer   . Goals of care, counseling/discussion 01/06/2019  . Elevated liver enzymes 01/06/2019  . Other fatigue 01/06/2019  . Other constipation 12/07/2018  . Right ovarian epithelial cancer ([place]) 12/06/2018    Past Surgical History:  Procedure Laterality Date  . ABDOMINAL HYSTERECTOMY     ovaries left  .  BACK SURGERY    . DEBULKING Bilateral 06/06/2019   Procedure: RADICAL TUMOR DEBULKING;  Surgeon: Everitt Amber, MD;  Location: [facility]S;  Service: Gynecology;  Laterality: Bilateral;  . FRACTURE SURGERY    . IR IMAGING GUIDED PORT INSERTION  12/15/2018  . OMENTECTOMY Bilateral 06/06/2019   Procedure: OMENTECTOMY;  Surgeon: Everitt Amber, MD;  Location: [facility]S;  Service: Gynecology;   Laterality: Bilateral;  . SALPINGOOPHORECTOMY Bilateral 06/06/2019   Procedure: EXPLORATORY LAPAROTOMY,OPEN RIGHT SALPINGO OOPHORECTOMY;  Surgeon: Everitt Amber, MD;  Location: [facility];  Service: Gynecology;  Laterality: Bilateral;     OB History   No obstetric history on file.     Family History  Problem Relation Age of Onset  . Stomach cancer Sister        dx 74s-50s  . Dementia Mother   . Cancer Sister        unk type, hysterectomy, dx 16s    Social History   Tobacco Use  . Smoking status: Never Smoker  . Smokeless tobacco: Never Used  Vaping Use  . Vaping Use: Never used  Substance Use Topics  . Alcohol use: No  . Drug use: No    Home Medications Prior to Admission medications   Medication Sig Start Date End Date Taking? Authorizing Provider  acetaminophen (TYLENOL) 325 MG tablet Take 650 mg by mouth every 6 (six) hours as needed for moderate pain or headache.     [provider]  aspirin EC 81 MG tablet Take 81 mg by mouth daily. Swallow whole.    [provider]  celecoxib (CELEBREX) 200 MG capsule celecoxib 200 mg capsule    [provider]  esomeprazole (NEXIUM) 40 MG capsule Take 40 mg by mouth 2 (two) times daily.  04/12/19   [provider]  levothyroxine (SYNTHROID) 50 MCG tablet TAKE ONE TABLET EVERY MORNING Patient taking differently: Take 50 mcg by mouth daily before breakfast.  05/16/19   [name], MD  lidocaine-prilocaine (EMLA) cream Apply to affected area once Patient taking differently: Apply 1 application topically daily as needed (port access).  12/07/18   [name], MD  losartan (COZAAR) 50 MG tablet Take 50 mg by mouth daily. 05/08/19   [provider]  Melatonin 3 MG TABS Take by mouth.    [provider]  ondansetron (ZOFRAN) 8 MG tablet Take 8 mg by mouth every 8 (eight) hours as needed.    [provider]  prochlorperazine (COMPAZINE) 10 MG tablet Take 10 mg by mouth every 6 (six)  hours as needed.    [provider]  SENEXON-S 8.6-50 MG tablet TAKE 1 TO 2 TABLETS AT BEDTIME FOR AFTER SURGERY (DO NOT TAKE IF HAVING DIARRHEA) 07/04/19   Dorothyann Gibbs, NP    Allergies    Patient has no known allergies.  Review of Systems   Review of Systems  All other systems reviewed and are negative.   Physical Exam Updated Vital Signs BP (!) 142/71   Pulse 82   Resp (!) 24   Ht 5\' 7"  (1.702 m)   Wt 96.6 kg   SpO2 94%   BMI 33.36 kg/m   Physical Exam Vitals and nursing note reviewed. Exam conducted with a chaperone present.  HENT:     Head: Normocephalic and atraumatic.  Eyes:     General: No scleral icterus.    Conjunctiva/sclera: Conjunctivae normal.  Cardiovascular:     Rate and Rhythm: Normal rate and regular rhythm.  Pulses: Normal pulses.  Pulmonary:     Effort: No respiratory distress.     Comments: Mildly increased work of breathing and tachypnea, particularly with exertion.  Oxygenating well on room air.  CTA bilaterally.  No distress. Abdominal:     General: Abdomen is flat. There is no distension.     Palpations: Abdomen is soft.     Tenderness: There is no abdominal tenderness.  Musculoskeletal:        General: Normal range of motion.     Cervical back: Normal range of motion.  Skin:    General: Skin is dry.  Neurological:     General: No focal deficit present.     Mental Status: She is alert and oriented to person, place, and time.     GCS: GCS eye subscore is 4. GCS verbal subscore is 5. GCS motor subscore is 6.  Psychiatric:        Mood and Affect: Mood normal.        Behavior: Behavior normal.        Thought Content: Thought content normal.     ED Results / Procedures / Treatments   Labs (all labs ordered are listed, but only abnormal results are displayed) Labs Reviewed  CBC WITH DIFFERENTIAL/PLATELET - Abnormal; Notable for the following components:      Result Value   RBC 2.71 (*)    Hemoglobin 10.3 (*)    HCT 30.8  (*)    MCV 113.7 (*)    MCH 38.0 (*)    RDW 16.9 (*)    Platelets 95 (*)    nRBC 0.5 (*)    Neutro Abs 1.6 (*)    All other components within normal limits  COMPREHENSIVE METABOLIC PANEL - Abnormal; Notable for the following components:   Potassium 3.4 (*)    Glucose, Bld 113 (*)    All other components within normal limits  RESP PANEL BY RT-PCR (FLU A&B, COVID) ARPGX2  TROPONIN I (HIGH SENSITIVITY)  TROPONIN I (HIGH SENSITIVITY)    EKG EKG Interpretation  Date/Time:  Monday July 15 2020 16:58:29 EST Ventricular Rate:  82 PR Interval:    QRS Duration: 95 QT Interval:  358 QTC Calculation: 419 R Axis:   -4 Text Interpretation: Sinus rhythm Low voltage, precordial leads No acute changes No significant change since last tracing Confirmed by Varney Biles [id number]) on 07/15/2020 6:00:42 PM   Radiology No results found.  Procedures Procedures (including critical care time)  Medications Ordered in ED Medications  iohexol (OMNIPAQUE) 350 MG/ML injection 100 mL (100 mLs Intravenous Contrast Given 07/15/20 1834)    ED Course  I have reviewed the triage vital signs and the nursing notes.  Pertinent labs & imaging results that were available during my care of the patient were reviewed by me and considered in my medical decision making (see chart for details).    MDM Rules/Calculators/A&P HEAR Score: 3                        Patient with pleuritic symptoms and shortness of breath.  She states this feels comparable to her prior massive pulmonary embolism.  Will obtain basic laboratory work-up as well as troponin.  Plan for CTA PE study.  Patient is in no acute distress.  No oxygen requirements or significant increased work of breathing at rest.  Tachypnea and increased work of breathing with ambulation.  PESI score elevated at 107 due to her cancer.  Patient denies  any history of bleeds or falls.  EKG without any significant changes when compared to prior tracings.     Laboratory work-up is reviewed and largely unremarkable.  She has a mild hypokalemia 3.4.  Anemia with hemoglobin 10.3 is improved when compared to her baseline.  Thrombocytopenia to 95, has been as low as 140s and pancytopenia noted by oncologist, but has never been below 100.  First troponin within normal limits at 7.  Lower suspicion for ACS.     At shift change care was transferred to [facility] [place] who will follow pending studies, re-evaluate, and determine disposition.     Final Clinical Impression(s) / ED Diagnoses Final diagnoses:  Shortness of breath    Rx / DC Orders ED Discharge Orders    None       Corena Herter, PA-C 07/15/20 Deneise Lever, MD 07/15/20 2354

## 2020-07-15 NOTE — Progress Notes (Signed)
ANTICOAGULATION CONSULT NOTE - Initial Consult  Pharmacy Consult for Heparin Indication: pulmonary embolus  No Known Allergies  Patient Measurements: Height: 5\' 7"  (170.2 cm) Weight: 96.6 kg (213 lb) IBW/kg (Calculated) : 61.6 Heparin Dosing Weight: 82.9 kg   Vital Signs: BP: 133/62 (01/17 1900) Pulse Rate: 86 (01/17 1900)  Labs: Recent Labs    07/15/20 1730  HGB 10.3*  HCT 30.8*  PLT 95*  CREATININE 0.88  TROPONINIHS 7    Estimated Creatinine Clearance: 63.9 mL/min (by C-G formula based on SCr of 0.88 mg/dL).   Medical History: Past Medical History:  Diagnosis Date  . Back pain   . DVT (deep venous thrombosis) ([place]) 02/02/2019   had DVT first that moved into lung  . Family history of stomach cancer   . Family history of stomach cancer   . GERD (gastroesophageal reflux disease)   . History of pulmonary embolus (PE) 02/02/2019   confirmed by CT chest- had DVT first that moved into lungs per patient  . Hypertension   . Pessary maintenance   . Stroke [facility])     Assessment: 80 yo female presented on 07/15/2020 with sharp left sided chest pain (worse with inspiration). CT obtained found mild amount of acute pulmonary embolism within multiple middle lobe and lower lobe branches of the right pulmonary artery, no RHS. Patient has a hx of DVT and PE in August 2020 and is not on anticoagulation prior to admission - pt reported was treated for 1 yr with apixaban. Patient also has ovarian cancer.   Pharmacy consulted to start heparin for PE. Hgb 10.3. Plt 95 - was 213 on 07/04/2020.   Goal of Therapy:  Heparin level 0.3-0.7 units/ml Monitor platelets by anticoagulation protocol: Yes   Plan:  Start heparin 1400 units/hr  No heparin bolus due to thrombocytopenia Check heparin level at 0400  Monitor heparin level, CBC and S/S of bleeding daily  Follow up transition to oral anticoagulant   Cristela Felt, PharmD Clinical Pharmacist  07/15/2020,7:22 PM

## 2020-07-15 NOTE — ED Triage Notes (Signed)
Pt brought to ED via [facility] for sharp left sided chest pain since last night when she takes a deep breath, left sided back pain, SOB, headache.

## 2020-07-15 NOTE — ED Provider Notes (Signed)
Care assumed from Krista Blue, PA-C at shift change pending CTA chest to rule out PE.  Please see his note for full HPI.  In short, patient is a 80 year old female with a past medical history significant for ovarian cancer, pancytopenia, GERD, hypertension, CVA, history of DVT/PE not currently on any anticoagulants who presents to the ED due to left-sided chest pain worse with deep inspiration x 1 day. Pain is located in the left thoracic region. She has a history of chronic back pain, but she notes this felt different than her baselines. Patient was on Eliquis for roughly 1 year following her DVT/PE however, is no longer on any anticoagulants.  Plan from previous provider.  Follow-up with CTA chest.  ED Course/Procedures    Results for orders placed or performed during the hospital encounter of 07/15/20 (from the past 24 hour(s))  Resp Panel by RT-PCR (Flu A&B, Covid) Nasopharyngeal Swab     Status: None   Collection Time: 07/15/20  5:30 PM   Specimen: Nasopharyngeal Swab; Nasopharyngeal(NP) swabs in vial transport medium  Result Value Ref Range   SARS Coronavirus 2 by RT PCR NEGATIVE NEGATIVE   Influenza A by PCR NEGATIVE NEGATIVE   Influenza B by PCR NEGATIVE NEGATIVE  CBC with Differential     Status: Abnormal   Collection Time: 07/15/20  5:30 PM  Result Value Ref Range   WBC 4.3 4.0 - 10.5 K/uL   RBC 2.71 (L) 3.87 - 5.11 MIL/uL   Hemoglobin 10.3 (L) 12.0 - 15.0 g/dL   HCT 30.8 (L) 36.0 - 46.0 %   MCV 113.7 (H) 80.0 - 100.0 fL   MCH 38.0 (H) 26.0 - 34.0 pg   MCHC 33.4 30.0 - 36.0 g/dL   RDW 16.9 (H) 11.5 - 15.5 %   Platelets 95 (L) 150 - 400 K/uL   nRBC 0.5 (H) 0.0 - 0.2 %   Neutrophils Relative % 38 %   Neutro Abs 1.6 (L) 1.7 - 7.7 K/uL   Lymphocytes Relative 42 %   Lymphs Abs 1.8 0.7 - 4.0 K/uL   Monocytes Relative 19 %   Monocytes Absolute 0.8 0.1 - 1.0 K/uL   Eosinophils Relative 0 %   Eosinophils Absolute 0.0 0.0 - 0.5 K/uL   Basophils Relative 1 %   Basophils Absolute  0.0 0.0 - 0.1 K/uL   Immature Granulocytes 0 %   Abs Immature Granulocytes 0.01 0.00 - 0.07 K/uL  Comprehensive metabolic panel     Status: Abnormal   Collection Time: 07/15/20  5:30 PM  Result Value Ref Range   Sodium 136 135 - 145 mmol/L   Potassium 3.4 (L) 3.5 - 5.1 mmol/L   Chloride 101 98 - 111 mmol/L   CO2 26 22 - 32 mmol/L   Glucose, Bld 113 (H) 70 - 99 mg/dL   BUN 16 8 - 23 mg/dL   Creatinine, Ser 0.88 0.44 - 1.00 mg/dL   Calcium 9.6 8.9 - 10.3 mg/dL   Total Protein 7.6 6.5 - 8.1 g/dL   Albumin 3.9 3.5 - 5.0 g/dL   AST 25 15 - 41 U/L   ALT 24 0 - 44 U/L   Alkaline Phosphatase 79 38 - 126 U/L   Total Bilirubin 0.4 0.3 - 1.2 mg/dL   GFR, Estimated >60 >60 mL/min   Anion gap 9 5 - 15  Troponin I (High Sensitivity)     Status: None   Collection Time: 07/15/20  5:30 PM  Result Value Ref Range  Troponin I (High Sensitivity) 7 <18 ng/L    CRITICAL CARE Performed by: Suzy Bouchard   Total critical care time: 40 minutes  Critical care time was exclusive of separately billable procedures and treating other patients.  Critical care was necessary to treat or prevent imminent or life-threatening deterioration.  Critical care was time spent personally by me on the following activities: development of treatment plan with patient and/or surrogate as well as nursing, discussions with consultants, evaluation of patient's response to treatment, examination of patient, obtaining history from patient or surrogate, ordering and performing treatments and interventions, ordering and review of laboratory studies, ordering and review of radiographic studies, pulse oximetry and re-evaluation of patient's condition.   MDM   80 year old female presents to the ED due to pleuritic chest pain associated with shortness of breath.  Has a previous history of PE but not on any anticoagulants.  Patient in no acute distress and nontoxic-appearing.  Patient also has a history of ovarian cancer.   PESI  score elevated at 107 due to history of cancer.  CBC reassuring with no leukocytosis.  Thrombocytopenia at 95 which appears lower than patient's baseline.  Hemoglobin 10.3 which appears around patient's baseline.  Initial troponin normal at 7.  Will obtain delta troponin to rule out ACS.  COVID/influenza negative.  CMP significant for mild hypokalemia at 3.4 and hyperglycemia 113.  No anion gap.  Doubt DKA. CTA chest personally reviewed which demonstrates: IMPRESSION:  1. Mild amount of acute pulmonary embolism within multiple middle  lobe and lower lobe branches of the right pulmonary artery.  2.  Small amount of chronic right pulmonary embolism.  3.  Mild posterior left basilar atelectasis and/or infiltrate.  4.  Small left pleural effusion.  5. Aortic atherosclerosis.   IV heparin started. Discussed with pharmacy who recommend only heparin infusion instead of bolus given thrombocytopenia. EKG personally reviewed which demonstrates normal sinus rhythm and no signs of acute ischemia. Discussed case with Dr. Kathrynn Humble who evaluated patient at bedside and agrees with assessment and plan.   Discussed case with Zierle-Ghosh with [facility] who agrees to admit patient for further treatment.     Suzy Bouchard, PA-C 07/15/20 1943    Varney Biles, MD 07/15/20 [phone]

## 2020-07-15 NOTE — H&P (Signed)
TRH H&P    Patient Demographics:    Selda Vista, is a 80 y.o. female  MRN: [id number]  DOB - 05-Sep-1940  Admit Date - 07/15/2020  Referring MD/NP/PA: Dr. Kathrynn Humble  Outpatient Primary MD for the patient is Caryl Bis, MD  Patient coming from: Home  Chief complaint-left-sided chest pain   HPI:    Freta Yoshinaga  is a 80 y.o. 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  Patient reports that pain actually started last night with left-sided thoracic level back pain.  Patient reports that she has chronic back pain but this felt different than her normal.  Any positional change in the night made the pain worse.  She did take Tylenol and this did not offer any relief.  And this morning she started to have left-sided chest pain under her breast.  Patient reports that there are 2 separate pains, there is no radiation of her back pain through to her chest.  Patient reports her chest pain is worse with deep inspiration.  She describes her pain is moderate at rest, severe with deep inspiration, and the pain is sharp. She has an associated shortness of breath with ambulation and speaking.  Patient denies any cough, dizziness, palpitations.  Patient reports that this feels similar to previous PE.  Patient does have a history of ovarian cancer.  She was diagnosed in 2020.  The decision was made to have 7 cycles of carboplatin and Taxol prior to surgery, and then 3 cycles after surgery.  Patient developed her PE and DVT in August of 2020.  She was on Eliquis for a year.  Patient did have her exploratory laparotomy with right salpingo-oophorectomy, omentectomy, radical tumor debulking for ovarian cancer.  Her last follow-up with oncology was in December 2021.  It was decided  to the 3 postsurgery cycles.  Last chemotherapy was on 06/25/2020.  Patient reports that she has not had any nausea or decrease in her appetite since the chemo.  Prior to restarting chemo they did get an echo which revealed an ejection fraction of 60 to 123456, normal diastolic parameters.  Patient was also noted to have anemia and they have been following CBC with chemotherapy.  In the ED Patient is afebrile, heart rate 82, respiratory rate 19, blood pressure 133/62, satting at 99% Hematology shows a hemoglobin 10.3, white blood cell count 4.3, platelets 95 Chemistry panel is mostly unremarkable with a hypokalemia that is mild at 3.4 COVID is pending CTA chest did reveal acute and chronic pulmonary emboli. EKG shows sinus rhythm at 82, QTC of 419 Heparin restarted per pharmacy consult Patient was ambulated and noted to become markedly short of breath, but she has not had an oxygen requirement thus far Patient took 324 mg of aspirin prior to arrival in the ED    Review of systems:    In addition to the HPI above,  No Fever-chills, No Headache, No changes  with Vision or hearing, No problems swallowing food or Liquids, does admit to GERD Admits to chest pain, shortness of breath, no cough, No Abdominal pain, No Nausea or Vomiting, bowel movements are regular with home regimen of nightly Dulcolax, No Blood in stool or Urine, No dysuria, No new skin rashes or bruises, No new joints pains-aches,  No new weakness, tingling, numbness in any extremity, No recent weight gain or loss, No polyuria, polydypsia or polyphagia, No significant Mental Stressors.  All other systems reviewed and are negative.    Past History of the following :    Past Medical History:  Diagnosis Date  . Back pain   . DVT (deep venous thrombosis) ([place]) 02/02/2019   had DVT first that moved into lung  . Family history of stomach cancer   . Family history of stomach cancer   . GERD (gastroesophageal reflux  disease)   . History of pulmonary embolus (PE) 02/02/2019   confirmed by CT chest- had DVT first that moved into lungs per patient  . Hypertension   . Pessary maintenance   . Stroke [facility])       Past Surgical History:  Procedure Laterality Date  . ABDOMINAL HYSTERECTOMY     ovaries left  . BACK SURGERY    . DEBULKING Bilateral 06/06/2019   Procedure: RADICAL TUMOR DEBULKING;  Surgeon: Everitt Amber, MD;  Location: [facility];  Service: Gynecology;  Laterality: Bilateral;  . FRACTURE SURGERY    . IR IMAGING GUIDED PORT INSERTION  12/15/2018  . OMENTECTOMY Bilateral 06/06/2019   Procedure: OMENTECTOMY;  Surgeon: Everitt Amber, MD;  Location: [facility];  Service: Gynecology;  Laterality: Bilateral;  . SALPINGOOPHORECTOMY Bilateral 06/06/2019   Procedure: EXPLORATORY LAPAROTOMY,OPEN RIGHT SALPINGO OOPHORECTOMY;  Surgeon: Everitt Amber, MD;  Location: [facility];  Service: Gynecology;  Laterality: Bilateral;      Social History:      Social History   Tobacco Use  . Smoking status: Never Smoker  . Smokeless tobacco: Never Used  Substance Use Topics  . Alcohol use: No       Family History :     Family History  Problem Relation Age of Onset  . Stomach cancer Sister        dx 54s-50s  . Dementia Mother   . Cancer Sister        unk type, hysterectomy, dx 6s     Home Medications:   Prior to Admission medications   Medication Sig Start Date End Date Taking? Authorizing Provider  acetaminophen (TYLENOL) 325 MG tablet Take 650 mg by mouth every 6 (six) hours as needed for moderate pain or headache.     [provider]  aspirin EC 81 MG tablet Take 81 mg by mouth daily. Swallow whole.    [provider]  celecoxib (CELEBREX) 200 MG capsule celecoxib 200 mg capsule    [provider]  esomeprazole (NEXIUM) 40 MG capsule Take 40 mg by mouth 2 (two) times daily.  04/12/19   [provider]  levothyroxine (SYNTHROID) 50 MCG tablet TAKE ONE TABLET EVERY  MORNING Patient taking differently: Take 50 mcg by mouth daily before breakfast.  05/16/19   [name], MD  lidocaine-prilocaine (EMLA) cream Apply to affected area once Patient taking differently: Apply 1 application topically daily as needed (port access).  12/07/18   [name], MD  losartan (COZAAR) 50 MG tablet Take 50 mg by mouth daily. 05/08/19   [provider]  Melatonin 3 MG TABS Take by  mouth.    [provider]  ondansetron (ZOFRAN) 8 MG tablet Take 8 mg by mouth every 8 (eight) hours as needed.    [provider]  prochlorperazine (COMPAZINE) 10 MG tablet Take 10 mg by mouth every 6 (six) hours as needed.    [provider]  SENEXON-S 8.6-50 MG tablet TAKE 1 TO 2 TABLETS AT BEDTIME FOR AFTER SURGERY (DO NOT TAKE IF HAVING DIARRHEA) 07/04/19   [name] D, NP     Allergies:    No Known Allergies   Physical Exam:   Vitals  Blood pressure 133/62, pulse 86, temperature 98.5 F (36.9 C), temperature source Oral, resp. rate 19, height 5\' 7"  (1.702 m), weight 96.6 kg, SpO2 99 %.  1.  General: Patient is lying supine in bed no acute distress  2. Psychiatric: Patient is alert and oriented x3, pleasant, cooperative with exam  3. Neurologic: Face is symmetric, speech and language are normal, equal sensation in the upper and lower extremities bilaterally, moves all 4 extremities voluntarily  4. HEENMT:  Head is atraumatic, normocephalic, pupils reactive to light, neck is supple, trachea is midline  5. Respiratory : Lungs are clear to auscultation bilaterally without wheeze, rhonchi, crackles.  No cyanosis.  6. Cardiovascular : Heart rate is normal, rhythm is regular, no murmurs rubs or gallops, no peripheral edema  7. Gastrointestinal:  Abdomen is soft, nondistended, nontender to palpation  8. Skin:  No acute lesions on limited skin exam.  Skin is warm dry and intact  9.Musculoskeletal:  Calf tenderness in the right lower  extremity, no calf tenderness in the left lower extremity, no acute deformity, no peripheral edema    Data Review:    CBC Recent Labs  Lab 07/15/20 1730  WBC 4.3  HGB 10.3*  HCT 30.8*  PLT 95*  MCV 113.7*  MCH 38.0*  MCHC 33.4  RDW 16.9*  LYMPHSABS 1.8  MONOABS 0.8  EOSABS 0.0  BASOSABS 0.0   ------------------------------------------------------------------------------------------------------------------  Results for orders placed or performed during the hospital encounter of 07/15/20 (from the past 48 hour(s))  Resp Panel by RT-PCR (Flu A&B, Covid) Nasopharyngeal Swab     Status: None   Collection Time: 07/15/20  5:30 PM   Specimen: Nasopharyngeal Swab; Nasopharyngeal(NP) swabs in vial transport medium  Result Value Ref Range   SARS Coronavirus 2 by RT PCR NEGATIVE NEGATIVE    Comment: (NOTE) SARS-CoV-2 target nucleic acids are NOT DETECTED.  The SARS-CoV-2 RNA is generally detectable in upper respiratory specimens during the acute phase of infection. The lowest concentration of SARS-CoV-2 viral copies this assay can detect is 138 copies/mL. A negative result does not preclude SARS-Cov-2 infection and should not be used as the sole basis for treatment or other patient management decisions. A negative result may occur with  improper specimen collection/handling, submission of specimen other than nasopharyngeal swab, presence of viral mutation(s) within the areas targeted by this assay, and inadequate number of viral copies(<138 copies/mL). A negative result must be combined with clinical observations, patient history, and epidemiological information. The expected result is Negative.  Fact Sheet for Patients:  EntrepreneurPulse.com.au  Fact Sheet for Healthcare Providers:  IncredibleEmployment.be  This test is no t yet approved or cleared by the [place] FDA and  has been authorized for detection and/or diagnosis of  SARS-CoV-2 by FDA under an Emergency Use Authorization (EUA). This EUA will remain  in effect (meaning this test can be used) for the duration of the COVID-19 declaration under  Section 564(b)(1) of the Act, 21 U.S.C.section 360bbb-3(b)(1), unless the authorization is terminated  or revoked sooner.       Influenza A by PCR NEGATIVE NEGATIVE   Influenza B by PCR NEGATIVE NEGATIVE    Comment: (NOTE) The Xpert Xpress SARS-CoV-2/FLU/RSV plus assay is intended as an aid in the diagnosis of influenza from Nasopharyngeal swab specimens and should not be used as a sole basis for treatment. Nasal washings and aspirates are unacceptable for Xpert Xpress SARS-CoV-2/FLU/RSV testing.  Fact Sheet for Patients: EntrepreneurPulse.com.au  Fact Sheet for Healthcare Providers: IncredibleEmployment.be  This test is not yet approved or cleared by the [place] FDA and has been authorized for detection and/or diagnosis of SARS-CoV-2 by FDA under an Emergency Use Authorization (EUA). This EUA will remain in effect (meaning this test can be used) for the duration of the COVID-19 declaration under Section 564(b)(1) of the Act, 21 U.S.C. section 360bbb-3(b)(1), unless the authorization is terminated or revoked.  Performed at [facility], [address], [place], [place] [address]   CBC with Differential     Status: Abnormal   Collection Time: 07/15/20  5:30 PM  Result Value Ref Range   WBC 4.3 4.0 - 10.5 K/uL   RBC 2.71 (L) 3.87 - 5.11 MIL/uL   Hemoglobin 10.3 (L) 12.0 - 15.0 g/dL   HCT 30.8 (L) 36.0 - 46.0 %   MCV 113.7 (H) 80.0 - 100.0 fL   MCH 38.0 (H) 26.0 - 34.0 pg   MCHC 33.4 30.0 - 36.0 g/dL   RDW 16.9 (H) 11.5 - 15.5 %   Platelets 95 (L) 150 - 400 K/uL    Comment: PLATELET COUNT CONFIRMED BY SMEAR Immature Platelet Fraction may be clinically indicated, consider ordering this additional test [id number]    nRBC 0.5 (H) 0.0 - 0.2 %   Neutrophils  Relative % 38 %   Neutro Abs 1.6 (L) 1.7 - 7.7 K/uL   Lymphocytes Relative 42 %   Lymphs Abs 1.8 0.7 - 4.0 K/uL   Monocytes Relative 19 %   Monocytes Absolute 0.8 0.1 - 1.0 K/uL   Eosinophils Relative 0 %   Eosinophils Absolute 0.0 0.0 - 0.5 K/uL   Basophils Relative 1 %   Basophils Absolute 0.0 0.0 - 0.1 K/uL   Immature Granulocytes 0 %   Abs Immature Granulocytes 0.01 0.00 - 0.07 K/uL    Comment: Performed at [facility], [address] [place], [place] [address]  Comprehensive metabolic panel     Status: Abnormal   Collection Time: 07/15/20  5:30 PM  Result Value Ref Range   Sodium 136 135 - 145 mmol/L   Potassium 3.4 (L) 3.5 - 5.1 mmol/L   Chloride 101 98 - 111 mmol/L   CO2 26 22 - 32 mmol/L   Glucose, Bld 113 (H) 70 - 99 mg/dL    Comment: Glucose reference range applies only to samples taken after fasting for at least 8 hours.   BUN 16 8 - 23 mg/dL   Creatinine, Ser 0.88 0.44 - 1.00 mg/dL   Calcium 9.6 8.9 - 10.3 mg/dL   Total Protein 7.6 6.5 - 8.1 g/dL   Albumin 3.9 3.5 - 5.0 g/dL   AST 25 15 - 41 U/L   ALT 24 0 - 44 U/L   Alkaline Phosphatase 79 38 - 126 U/L   Total Bilirubin 0.4 0.3 - 1.2 mg/dL   GFR, Estimated >60 >60 mL/min    Comment: (NOTE) Calculated using the CKD-EPI Creatinine Equation (  2021)    Anion gap 9 5 - 15    Comment: Performed at [facility], [address], [place], [place] [address]  Troponin I (High Sensitivity)     Status: None   Collection Time: 07/15/20  5:30 PM  Result Value Ref Range   Troponin I (High Sensitivity) 7 <18 ng/L    Comment: (NOTE) Elevated high sensitivity troponin I (hsTnI) values and significant  changes across serial measurements may suggest ACS but many other  chronic and acute conditions are known to elevate hsTnI results.  Refer to the "Links" section for chest pain algorithms and additional  guidance. Performed at [facility], [address], [place], [place] [address]     Chemistries  Recent Labs  Lab  07/15/20 1730  NA 136  K 3.4*  CL 101  CO2 26  GLUCOSE 113*  BUN 16  CREATININE 0.88  CALCIUM 9.6  AST 25  ALT 24  ALKPHOS 79  BILITOT 0.4   ------------------------------------------------------------------------------------------------------------------  ------------------------------------------------------------------------------------------------------------------ GFR: Estimated Creatinine Clearance: 63.9 mL/min (by C-G formula based on SCr of 0.88 mg/dL). Liver Function Tests: Recent Labs  Lab 07/15/20 1730  AST 25  ALT 24  ALKPHOS 79  BILITOT 0.4  PROT 7.6  ALBUMIN 3.9   No results for input(s): LIPASE, AMYLASE in the last 168 hours. No results for input(s): AMMONIA in the last 168 hours. Coagulation Profile: No results for input(s): INR, PROTIME in the last 168 hours. Cardiac Enzymes: No results for input(s): CKTOTAL, CKMB, CKMBINDEX, TROPONINI in the last 168 hours. BNP (last 3 results) No results for input(s): PROBNP in the last 8760 hours. HbA1C: No results for input(s): HGBA1C in the last 72 hours. CBG: No results for input(s): GLUCAP in the last 168 hours. Lipid Profile: No results for input(s): CHOL, HDL, LDLCALC, TRIG, CHOLHDL, LDLDIRECT in the last 72 hours. Thyroid Function Tests: No results for input(s): TSH, T4TOTAL, FREET4, T3FREE, THYROIDAB in the last 72 hours. Anemia Panel: No results for input(s): VITAMINB12, FOLATE, FERRITIN, TIBC, IRON, RETICCTPCT in the last 72 hours.  --------------------------------------------------------------------------------------------------------------- Urine analysis:    Component Value Date/Time   COLORURINE YELLOW 06/02/2019 0925   APPEARANCEUR CLEAR 06/02/2019 0925   LABSPEC 1.012 06/02/2019 0925   PHURINE 6.0 06/02/2019 0925   GLUCOSEU NEGATIVE 06/02/2019 0925   HGBUR NEGATIVE 06/02/2019 0925   BILIRUBINUR NEGATIVE 06/02/2019 0925   KETONESUR NEGATIVE 06/02/2019 0925   PROTEINUR NEGATIVE 06/02/2019  0925   UROBILINOGEN 0.2 04/08/2013 2040   NITRITE NEGATIVE 06/02/2019 0925   LEUKOCYTESUR SMALL (A) 06/02/2019 0925      Imaging Results:    CT Angio Chest PE W and/or Wo Contrast  Result Date: 07/15/2020 CLINICAL DATA:  Hervey Ard left-sided chest pain. EXAM: CT ANGIOGRAPHY CHEST WITH CONTRAST TECHNIQUE: Multidetector CT imaging of the chest was performed using the standard protocol during bolus administration of intravenous contrast. Multiplanar CT image reconstructions and MIPs were obtained to evaluate the vascular anatomy. CONTRAST:  132mL OMNIPAQUE IOHEXOL 350 MG/ML SOLN COMPARISON:  September 20, 2019 FINDINGS: Cardiovascular: A right-sided venous Port-A-Cath is in place. Very mild calcification of the aortic arch is seen. Mild areas of intraluminal low attenuation are seen within multiple middle lobe and lower lobe branches of the right pulmonary artery (axial CT images 55 through 77, CT series number 4). A very small curvilinear area of intraluminal low attenuation is also seen within a distal segmental branch of the right pulmonary artery. A small amount of pulmonary embolism is seen within this region on  the prior study. No saddle embolus is identified. Normal heart size without evidence of heart strain. No pericardial effusion. Mediastinum/Nodes: No enlarged mediastinal, hilar, or axillary lymph nodes. Thyroid gland, trachea, and esophagus demonstrate no significant findings. Lungs/Pleura: Mild biapical scarring and/or atelectasis is seen. Mild posterior left basilar atelectasis and/or infiltrate is also noted. There is a small left pleural effusion. No pneumothorax is identified. Upper Abdomen: No acute abnormality. Musculoskeletal: No chest wall abnormality. No acute or significant osseous findings. Review of the MIP images confirms the above findings. IMPRESSION: 1. Mild amount of acute pulmonary embolism within multiple middle lobe and lower lobe branches of the right pulmonary artery. 2.   Small  amount of chronic right pulmonary embolism. 3.   Mild posterior left basilar atelectasis and/or infiltrate. 4.   Small left pleural effusion. 5. Aortic atherosclerosis. Aortic Atherosclerosis (ICD10-I70.0). Electronically Signed   By: Virgina Norfolk M.D.   On: 07/15/2020 18:57       Assessment & Plan:    Principal Problem:   PE (pulmonary thromboembolism) ([place]) Active Problems:   Other constipation   Acquired hypothyroidism   Pulmonary embolism ([facility])   Hypokalemia   Essential hypertension   1. Acute and chronic pulmonary emboli 1. CT angio of chest shows mild amount of acute pulmonary embolism within multiple middle lobe and lower lobe branches of the right pulmonary artery.  Small chronic right pulmonary embolism. 2. Heparin started per pharmacy consult -no bolus of heparin in the setting of thrombocytopenia 3. Patient had an echo in December that showed normal ejection fraction 60 to 123456, normal diastolic parameters.  Repeat echo to assess for heart strain in the setting of acute pulmonary embolism 4. Bilateral lower extremity ultrasound for DVT 5. Patient was on Eliquis for a year for previous PE, prefers something cheaper this time.  We did discuss warfarin-patient advised to think about the decision between the 2 to be able to discuss with a team 6. PE likely secondary to hypercoagulable state in the setting of ovarian cancer 7. Continue to monitor on telemetry 2. Hypokalemia 1. Mild with a potassium of 3.4 2. Replace with 20 mEq potassium 3. Trend in the a.m. 3. Chronic microcytic anemia 1. Hemoglobin at baseline at 10.3, MCV slightly higher than baseline at 113 2. Anemia panel ordered 3. We will monitor closely for any signs or symptoms of bleeding in the setting of full dose heparin 4. Thrombocytopenia 1. Platelets are below baseline at 95 2. Baseline for this patient seems to be around 200, she has dropped as low as 136 3. Holding heparin bolus and just starting  heparin drip in the setting of thrombocytopenia 4. Likely secondary to carboplatin 5. Trend in the a.m. with CBC 5. Hypertension 1. Continue losartan 6. Thyroid disease 1. Continue Synthroid 2. Check TSH 7. Chronic constipation 1. Dulcolax nightly as per home regimen 2. Senna as needed   DVT Prophylaxis-   Heparin- SCDs   AM Labs Ordered, also please review Full Orders  Family Communication: No family at bedside  Code Status: Full  Admission status:Inpatient :The appropriate admission status for this patient is INPATIENT. Inpatient status is judged to be reasonable and necessary in order to provide the required intensity of service to ensure the patient's safety. The patient's presenting symptoms, physical exam findings, and initial radiographic and laboratory data in the context of their chronic comorbidities is felt to place them at high risk for further clinical deterioration. Furthermore, it is not anticipated that the patient will be  medically stable for discharge from the hospital within 2 midnights of admission. The following factors support the admission status of inpatient.     The patient's presenting symptoms include left-sided chest pain The worrisome physical exam findings include calf tenderness right lower extremity The initial radiographic and laboratory data are worrisome because of acute and chronic pulmonary emboli, thrombocytopenia, hypokalemia The chronic co-morbidities include ovarian cancer, hypertension, thyroid disease, chronic microcytic anemia, thrombocytopenia       * I certify that at the point of admission it is my clinical judgment that the patient will require inpatient hospital care spanning beyond 2 midnights from the point of admission due to high intensity of service, high risk for further deterioration and high frequency of surveillance required.*  Time spent in minutes : [place]

## 2020-07-16 ENCOUNTER — Inpatient Hospital Stay (HOSPITAL_COMMUNITY): Payer: Medicare Other

## 2020-07-16 DIAGNOSIS — I351 Nonrheumatic aortic (valve) insufficiency: Secondary | ICD-10-CM | POA: Diagnosis not present

## 2020-07-16 DIAGNOSIS — I2694 Multiple subsegmental pulmonary emboli without acute cor pulmonale: Secondary | ICD-10-CM

## 2020-07-16 LAB — RETICULOCYTES
Immature Retic Fract: 30.3 % — ABNORMAL HIGH (ref 2.3–15.9)
RBC.: 2.27 MIL/uL — ABNORMAL LOW (ref 3.87–5.11)
Retic Count, Absolute: 72.6 10*3/uL (ref 19.0–186.0)
Retic Ct Pct: 3.2 % — ABNORMAL HIGH (ref 0.4–3.1)

## 2020-07-16 LAB — ECHOCARDIOGRAM COMPLETE
Area-P 1/2: 3.21 cm2
Height: 67 in
P 1/2 time: 496 msec
S' Lateral: 2.4 cm
Weight: 3408 oz

## 2020-07-16 LAB — VITAMIN B12: Vitamin B-12: 188 pg/mL (ref 180–914)

## 2020-07-16 LAB — COMPREHENSIVE METABOLIC PANEL
ALT: 27 U/L (ref 0–44)
AST: 29 U/L (ref 15–41)
Albumin: 3.1 g/dL — ABNORMAL LOW (ref 3.5–5.0)
Alkaline Phosphatase: 68 U/L (ref 38–126)
Anion gap: 9 (ref 5–15)
BUN: 14 mg/dL (ref 8–23)
CO2: 26 mmol/L (ref 22–32)
Calcium: 9.6 mg/dL (ref 8.9–10.3)
Chloride: 101 mmol/L (ref 98–111)
Creatinine, Ser: 0.8 mg/dL (ref 0.44–1.00)
GFR, Estimated: >60 mL/min (ref >60)
Glucose, Bld: 111 mg/dL — ABNORMAL HIGH (ref 70–99)
Potassium: 3.8 mmol/L (ref 3.5–5.1)
Sodium: 136 mmol/L (ref 135–145)
Total Bilirubin: 0.6 mg/dL (ref 0.3–1.2)
Total Protein: 6.2 g/dL — ABNORMAL LOW (ref 6.5–8.1)

## 2020-07-16 LAB — HEPARIN LEVEL (UNFRACTIONATED)
Heparin Unfractionated: 0.28 IU/mL — ABNORMAL LOW (ref 0.30–0.70)
Heparin Unfractionated: 0.38 IU/mL (ref 0.30–0.70)

## 2020-07-16 LAB — CBC
HCT: 26.1 % — ABNORMAL LOW (ref 36.0–46.0)
Hemoglobin: 8.8 g/dL — ABNORMAL LOW (ref 12.0–15.0)
MCH: 38.6 pg — ABNORMAL HIGH (ref 26.0–34.0)
MCHC: 33.7 g/dL (ref 30.0–36.0)
MCV: 114.5 fL — ABNORMAL HIGH (ref 80.0–100.0)
Platelets: 84 10*3/uL — ABNORMAL LOW (ref 150–400)
RBC: 2.28 MIL/uL — ABNORMAL LOW (ref 3.87–5.11)
RDW: 16.6 % — ABNORMAL HIGH (ref 11.5–15.5)
WBC: 3 10*3/uL — ABNORMAL LOW (ref 4.0–10.5)
nRBC: 1 % — ABNORMAL HIGH (ref 0.0–0.2)

## 2020-07-16 LAB — FOLATE: Folate: 15.3 ng/mL (ref >5.9)

## 2020-07-16 LAB — IRON AND TIBC
Iron: 52 ug/dL (ref 28–170)
Saturation Ratios: 16 % (ref 10.4–31.8)
TIBC: 319 ug/dL (ref 250–450)
UIBC: 267 ug/dL

## 2020-07-16 LAB — MAGNESIUM: Magnesium: 1.2 mg/dL — ABNORMAL LOW (ref 1.7–2.4)

## 2020-07-16 LAB — TSH: TSH: 7.694 u[IU]/mL — ABNORMAL HIGH (ref 0.350–4.500)

## 2020-07-16 LAB — FERRITIN: Ferritin: 176 ng/mL (ref 11–307)

## 2020-07-16 MED ORDER — LEVOTHYROXINE SODIUM 75 MCG PO TABS
75.0000 ug | ORAL_TABLET | Freq: Every day | ORAL | Status: DC
  Administered 2020-07-17: 75 ug via ORAL
  Filled 2020-07-16: qty 1

## 2020-07-16 MED ORDER — POLYETHYLENE GLYCOL 3350 17 G PO PACK: 17.0000 g | PACK | Freq: Every day | ORAL | Status: DC

## 2020-07-16 MED ORDER — CHLORHEXIDINE GLUCONATE CLOTH 2 % EX PADS
6.0000 | MEDICATED_PAD | Freq: Every day | CUTANEOUS | Status: DC
  Administered 2020-07-16 – 2020-07-17 (×2): 6 via TOPICAL

## 2020-07-16 MED ORDER — BISACODYL 5 MG PO TBEC
5.0000 mg | DELAYED_RELEASE_TABLET | Freq: Every day | ORAL | Status: DC
  Administered 2020-07-16 – 2020-07-17 (×2): 5 mg via ORAL
  Filled 2020-07-16 (×2): qty 1

## 2020-07-16 MED ORDER — PANTOPRAZOLE SODIUM 40 MG PO TBEC
40.0000 mg | DELAYED_RELEASE_TABLET | Freq: Every day | ORAL | Status: DC
  Administered 2020-07-16 – 2020-07-17 (×2): 40 mg via ORAL
  Filled 2020-07-16 (×2): qty 1

## 2020-07-16 MED ORDER — SENNOSIDES-DOCUSATE SODIUM 8.6-50 MG PO TABS
2.0000 | ORAL_TABLET | Freq: Every day | ORAL | Status: DC
  Administered 2020-07-16: 2 via ORAL
  Filled 2020-07-16: qty 2

## 2020-07-16 MED ORDER — VITAMIN B-12 1000 MCG PO TABS
1000.0000 ug | ORAL_TABLET | Freq: Every day | ORAL | Status: DC
  Administered 2020-07-16 – 2020-07-17 (×2): 1000 ug via ORAL
  Filled 2020-07-16 (×2): qty 1

## 2020-07-16 NOTE — Progress Notes (Signed)
ANTICOAGULATION CONSULT NOTE   Pharmacy Consult for Heparin Indication: pulmonary embolus  No Known Allergies  Patient Measurements: Height: 5\' 7"  (170.2 cm) Weight: 96.6 kg (213 lb) IBW/kg (Calculated) : 61.6 Heparin Dosing Weight: 82.9 kg   Vital Signs: BP: 121/61 (01/18 0520) Pulse Rate: 75 (01/18 0520)  Labs: Recent Labs    07/15/20 1730 07/15/20 1859 07/16/20 0448  HGB 10.3*  --  8.8*  HCT 30.8*  --  26.1*  PLT 95*  --  84*  HEPARINUNFRC  --   --  0.28*  CREATININE 0.88  --  0.80  TROPONINIHS 7 8  --     Estimated Creatinine Clearance: 70.3 mL/min (by C-G formula based on SCr of 0.8 mg/dL).   Medical History: Past Medical History:  Diagnosis Date  . Back pain   . DVT (deep venous thrombosis) ([place]) 02/02/2019   had DVT first that moved into lung  . Family history of stomach cancer   . Family history of stomach cancer   . GERD (gastroesophageal reflux disease)   . History of pulmonary embolus (PE) 02/02/2019   confirmed by CT chest- had DVT first that moved into lungs per patient  . Hypertension   . Pessary maintenance   . Stroke [facility])     Assessment: 80 yo female presented on 07/15/2020 with sharp left sided chest pain (worse with inspiration). CT obtained found mild amount of acute pulmonary embolism within multiple middle lobe and lower lobe branches of the right pulmonary artery, no RHS. Patient has a hx of DVT and PE in August 2020 and is not on anticoagulation prior to admission - pt reported was treated for 1 yr with apixaban. Patient also has ovarian cancer.   Pharmacy consulted to start heparin for PE. Hgb 10.3. Plt 95 - was 213 on 07/04/2020.   1/18 AM update:  Heparin level just below goal Hgb down some-watch closely   Goal of Therapy:  Heparin level 0.3-0.7 units/ml Monitor platelets by anticoagulation protocol: Yes   Plan:  Inc heparin to 1500 units/hr Re-check heparin level at [place], PharmD, [place]  Pharmacist Phone: [phone]

## 2020-07-16 NOTE — Progress Notes (Signed)
Patient Demographics:    Paula Peterson, is a 80 y.o. female, DOB - [birth year], HBZ:[id number]  Admit date - 07/15/2020   Admitting Physician Rolla Plate, DO  Outpatient Primary MD for the patient is Caryl Bis, MD  LOS - 1   Chief Complaint  Patient presents with  . Chest Pain        Subjective:    [name] today has no fevers, no emesis, complains of pleuritic chest pains -- Some dyspnea on exertion -Denies any bleeding concerns at this time,      Assessment  & Plan :    Principal Problem:   PE (pulmonary thromboembolism) (HCC) Active Problems:   Other constipation   Acquired hypothyroidism   Pulmonary embolism (HCC)   Hypokalemia   Essential hypertension  Brief Summary:- 80 y.o. female,with a past medical history significant for ovarian cancer, anemia, GERD, hypertension, CVA, history of DVT/PE admitted on 07/15/2020 acute on chronic pulmonary embolism   A/p 1) acute on chronic pulmonary embolism----given underlying ovarian cancer and prior history of DVT and PE patient will need lifelong anticoagulation at this time around -Please note that patient previously treated with Eliquis for about a year -Continue IV heparin through 07/17/2020 -Possible discharge home on Eliquis on 07/17/2020 -Echocardiogram with preserved EF of 60 to 65% without significant right heart strain -Lower extremity venous Dopplers without DVT -Patient with worsening thrombocytopenia and worsening anemia--- hence would not switch from heparin at this time just in case she bleeds  2)Pancytopenia--patient platelets at the lowest they have been, hemoglobin is at the lowest its been  -suspect neoplastic related compounded by chemotherapy -Monitor closely for possible bleeding while on anticoagulation -Ferritin is not low, serum iron, TIBC and iron saturation are WNL -B12 is low normal--- we will give a  boost  3) hypothyroidism--TSH is 7.69 , increase levothyroxine to 75 mcg daily  4) HTN--hold losartan due to contrast study  5) hypomagnesemia/hypokalemia--- replete and recheck  Disposition/Need for in-Hospital Stay- patient unable to be discharged at this time due to acute pulmonary embolism requiring IV heparin in the patient with dropping hemoglobin and dropping platelet levels   Status is: Inpatient  Remains inpatient appropriate because:Please see above   Disposition: The patient is from: Home              Anticipated d/c is to: Home              Anticipated d/c date is: 1 day              Patient currently is not medically stable to d/c. Barriers: Not Clinically Stable-   Code Status :  -  Code Status: Full Code   Family Communication:    NA (patient is alert, awake and coherent)   Consults  :  pharmacy  DVT Prophylaxis  :    /iv heparin      Lab Results  Component Value Date   PLT 84 (L) 07/16/2020    Inpatient Medications  Scheduled Meds: . aspirin EC  81 mg Oral Daily  . bisacodyl  5 mg Oral QHS  . Chlorhexidine Gluconate Cloth  6 each Topical Daily  . levothyroxine  50 mcg Oral QAC breakfast  . losartan  50  mg Oral Daily   Continuous Infusions: . heparin 1,500 Units/hr (07/16/20 0852)   PRN Meds:.acetaminophen **OR** acetaminophen, melatonin, morphine injection, ondansetron **OR** ondansetron (ZOFRAN) IV, oxyCODONE, senna-docusate    Anti-infectives (From admission, onward)   None        Objective:   Vitals:   07/16/20 0520 07/16/20 0700 07/16/20 0930 07/16/20 1047  BP: 121/61 (!) 115/57 115/79 128/67  Pulse: 75 90 84 76  Resp: 15 (!) 27 (!) 23 18  Temp:    98.2 F (36.8 C)  TempSrc:    Oral  SpO2: 95% 96% 95% 96%  Weight:    96.3 kg  Height:    5\' 7"  (1.702 m)    Wt Readings from Last 3 Encounters:  07/16/20 96.3 kg  06/11/20 97.8 kg  05/27/20 97.1 kg     Intake/Output Summary (Last 24 hours) at 07/16/2020 1144 Last data  filed at 07/16/2020 0500 Gross per 24 hour  Intake 117.94 ml  Output --  Net 117.94 ml     Physical Exam  Gen:- Awake Alert,   HEENT:- [place].AT, No sclera icterus Neck-Supple Neck,No JVD,.  Lungs-  CTAB , fair symmetrical air movement CV- S1, S2 normal, regular  Abd-  +ve B.Sounds, Abd Soft, No tenderness,    Extremity/Skin:- No  edema, pedal pulses present  Psych-affect is appropriate, oriented x3 Neuro-no new focal deficits, no tremors   Data Review:   Micro Results Recent Results (from the past 240 hour(s))  Resp Panel by RT-PCR (Flu A&B, Covid) Nasopharyngeal Swab     Status: None   Collection Time: 07/15/20  5:30 PM   Specimen: Nasopharyngeal Swab; Nasopharyngeal(NP) swabs in vial transport medium  Result Value Ref Range Status   SARS Coronavirus 2 by RT PCR NEGATIVE NEGATIVE Final    Comment: (NOTE) SARS-CoV-2 target nucleic acids are NOT DETECTED.  The SARS-CoV-2 RNA is generally detectable in upper respiratory specimens during the acute phase of infection. The lowest concentration of SARS-CoV-2 viral copies this assay can detect is 138 copies/mL. A negative result does not preclude SARS-Cov-2 infection and should not be used as the sole basis for treatment or other patient management decisions. A negative result may occur with  improper specimen collection/handling, submission of specimen other than nasopharyngeal swab, presence of viral mutation(s) within the areas targeted by this assay, and inadequate number of viral copies(<138 copies/mL). A negative result must be combined with clinical observations, patient history, and epidemiological information. The expected result is Negative.  Fact Sheet for Patients:  EntrepreneurPulse.com.au  Fact Sheet for Healthcare Providers:  IncredibleEmployment.be  This test is no t yet approved or cleared by the [place] FDA and  has been authorized for detection and/or diagnosis of  SARS-CoV-2 by FDA under an Emergency Use Authorization (EUA). This EUA will remain  in effect (meaning this test can be used) for the duration of the COVID-19 declaration under Section 564(b)(1) of the Act, 21 U.S.C.section 360bbb-3(b)(1), unless the authorization is terminated  or revoked sooner.       Influenza A by PCR NEGATIVE NEGATIVE Final   Influenza B by PCR NEGATIVE NEGATIVE Final    Comment: (NOTE) The Xpert Xpress SARS-CoV-2/FLU/RSV plus assay is intended as an aid in the diagnosis of influenza from Nasopharyngeal swab specimens and should not be used as a sole basis for treatment. Nasal washings and aspirates are unacceptable for Xpert Xpress SARS-CoV-2/FLU/RSV testing.  Fact Sheet for Patients: EntrepreneurPulse.com.au  Fact Sheet for Healthcare Providers: IncredibleEmployment.be  This test is  not yet approved or cleared by the [place] and has been authorized for detection and/or diagnosis of SARS-CoV-2 by FDA under an Emergency Use Authorization (EUA). This EUA will remain in effect (meaning this test can be used) for the duration of the COVID-19 declaration under Section 564(b)(1) of the Act, 21 U.S.C. section 360bbb-3(b)(1), unless the authorization is terminated or revoked.  Performed at [facility], [address] [place], [place] [address]     Radiology Reports CT Angio Chest PE W and/or Wo Contrast  Result Date: 07/15/2020 CLINICAL DATA:  Hervey Ard left-sided chest pain. EXAM: CT ANGIOGRAPHY CHEST WITH CONTRAST TECHNIQUE: Multidetector CT imaging of the chest was performed using the standard protocol during bolus administration of intravenous contrast. Multiplanar CT image reconstructions and MIPs were obtained to evaluate the vascular anatomy. CONTRAST:  14mL OMNIPAQUE IOHEXOL 350 MG/ML SOLN COMPARISON:  September 20, 2019 FINDINGS: Cardiovascular: A right-sided venous Port-A-Cath is in place. Very mild calcification  of the aortic arch is seen. Mild areas of intraluminal low attenuation are seen within multiple middle lobe and lower lobe branches of the right pulmonary artery (axial CT images 55 through 77, CT series number 4). A very small curvilinear area of intraluminal low attenuation is also seen within a distal segmental branch of the right pulmonary artery. A small amount of pulmonary embolism is seen within this region on the prior study. No saddle embolus is identified. Normal heart size without evidence of heart strain. No pericardial effusion. Mediastinum/Nodes: No enlarged mediastinal, hilar, or axillary lymph nodes. Thyroid gland, trachea, and esophagus demonstrate no significant findings. Lungs/Pleura: Mild biapical scarring and/or atelectasis is seen. Mild posterior left basilar atelectasis and/or infiltrate is also noted. There is a small left pleural effusion. No pneumothorax is identified. Upper Abdomen: No acute abnormality. Musculoskeletal: No chest wall abnormality. No acute or significant osseous findings. Review of the MIP images confirms the above findings. IMPRESSION: 1. Mild amount of acute pulmonary embolism within multiple middle lobe and lower lobe branches of the right pulmonary artery. 2.   Small amount of chronic right pulmonary embolism. 3.   Mild posterior left basilar atelectasis and/or infiltrate. 4.   Small left pleural effusion. 5. Aortic atherosclerosis. Aortic Atherosclerosis (ICD10-I70.0). Electronically Signed   By: Virgina Norfolk M.D.   On: 07/15/2020 18:57   US Venous Img Lower Bilateral (DVT)  Result Date: 07/16/2020 CLINICAL DATA:  Pulmonary embolism discovered on CT in this 80 year old female EXAM: Bilateral LOWER EXTREMITY VENOUS DOPPLER ULTRASOUND TECHNIQUE: Gray-scale sonography with compression, as well as color and duplex ultrasound, were performed to evaluate the deep venous system(s) from the level of the common femoral vein through the popliteal and proximal calf  veins. COMPARISON:  CT of the chest of July 15, 2020 FINDINGS: VENOUS Normal compressibility of the common femoral, superficial femoral, and popliteal veins, as well as the visualized calf veins. Visualized portions of profunda femoral vein and great saphenous vein unremarkable. No filling defects to suggest DVT on grayscale or color Doppler imaging. Doppler waveforms show normal direction of venous flow, normal respiratory plasticity and response to augmentation. OTHER None. Limitations: none IMPRESSION: Negative for lower extremity deep venous thrombosis in the bilateral lower extremities. Electronically Signed   By: Zetta Bills M.D.   On: 07/16/2020 09:50   ECHOCARDIOGRAM COMPLETE  Result Date: 07/16/2020    ECHOCARDIOGRAM REPORT   Patient Name:   TRULIE GUARD Leder Date of Exam: 07/16/2020 Medical Rec #:  [id number]    Height:  67.0 in Accession #:    [id number]   Weight:       213.0 lb Date of Birth:  April 03, 1941   BSA:          2.077 m Patient Age:    79 years     BP:           115/57 mmHg Patient Gender: F            HR:           73 bpm. [facility] Procedure: 2D Echo, Cardiac Doppler and Color Doppler Indications:    Pulmonary Embolus 415.19 / I26.99  History:        Patient has prior history of Echocardiogram examinations, most                 recent 06/18/2020. Stroke; Risk Factors:Hypertension. DVT (deep                 venous thrombosis) (HCC) (From Hx), History of pulmonary                 embolus, Encounter for antineoplastic chemotheraphy, Right                 ovarian epithelial cancer.  Sonographer:    Celesta GentileBernard White RCS Referring Phys: [id number] ASIA B ZIERLE-GHOSH IMPRESSIONS  1. Left ventricular ejection fraction, by estimation, is 60 to 65%. The left ventricle has normal function. The left ventricle has no regional wall motion abnormalities. There is mild left ventricular hypertrophy. Left ventricular diastolic parameters are indeterminate.  2. Right ventricular systolic  function is normal. The right ventricular size is normal. There is normal pulmonary artery systolic pressure. The estimated right ventricular systolic pressure is 16.8 mmHg.  3. The mitral valve is grossly normal. Trivial mitral valve regurgitation.  4. The aortic valve is tricuspid. Aortic valve regurgitation is mild.  5. The inferior vena cava is normal in size with greater than 50% respiratory variability, suggesting right atrial pressure of 3 mmHg. FINDINGS  Left Ventricle: Left ventricular ejection fraction, by estimation, is 60 to 65%. The left ventricle has normal function. The left ventricle has no regional wall motion abnormalities. The left ventricular internal cavity size was normal in size. There is  mild left ventricular hypertrophy. Left ventricular diastolic parameters are indeterminate. Right Ventricle: The right ventricular size is normal. No increase in right ventricular wall thickness. Right ventricular systolic function is normal. There is normal pulmonary artery systolic pressure. The tricuspid regurgitant velocity is 1.86 m/s, and  with an assumed right atrial pressure of 3 mmHg, the estimated right ventricular systolic pressure is 16.8 mmHg. Left Atrium: Left atrial size was normal in size. Right Atrium: Right atrial size was normal in size. Pericardium: There is no evidence of pericardial effusion. Presence of pericardial fat pad. Mitral Valve: The mitral valve is grossly normal. Trivial mitral valve regurgitation. Tricuspid Valve: The tricuspid valve is grossly normal. Tricuspid valve regurgitation is trivial. Aortic Valve: The aortic valve is tricuspid. There is mild aortic valve annular calcification. Aortic valve regurgitation is mild. Aortic regurgitation PHT measures 496 msec. Pulmonic Valve: The pulmonic valve was grossly normal. Pulmonic valve regurgitation is trivial. Aorta: The aortic root is normal in size and structure. Venous: The inferior vena cava is normal in size with greater  than 50% respiratory variability, suggesting right atrial pressure of 3 mmHg. IAS/Shunts: No atrial level shunt detected by color flow Doppler.  LEFT VENTRICLE PLAX 2D LVIDd:  4.60 cm  Diastology LVIDs:         2.40 cm  LV e' medial:    6.20 cm/s LV PW:         1.10 cm  LV E/e' medial:  10.5 LV IVS:        1.00 cm  LV e' lateral:   9.90 cm/s LVOT diam:     2.00 cm  LV E/e' lateral: 6.6 LV SV:         78 LV SV Index:   37 LVOT Area:     3.14 cm  RIGHT VENTRICLE RV S prime:     11.00 cm/s TAPSE (M-mode): 2.1 cm LEFT ATRIUM             Index       RIGHT ATRIUM           Index LA diam:        3.30 cm 1.59 cm/m  RA Area:     18.40 cm LA Vol (A2C):   43.9 ml 21.14 ml/m RA Volume:   46.50 ml  22.39 ml/m LA Vol (A4C):   64.7 ml 31.15 ml/m LA Biplane Vol: 54.8 ml 26.38 ml/m  AORTIC VALVE LVOT Vmax:   111.00 cm/s LVOT Vmean:  77.400 cm/s LVOT VTI:    0.247 m AI PHT:      496 msec  AORTA Ao Root diam: 3.20 cm MITRAL VALVE               TRICUSPID VALVE MV Area (PHT): 3.21 cm    TR Peak grad:   13.8 mmHg MV Decel Time: 236 msec    TR Vmax:        186.00 cm/s MV E velocity: 65.20 cm/s MV A velocity: 80.60 cm/s  SHUNTS MV E/A ratio:  0.81        Systemic VTI:  0.25 m                            Systemic Diam: 2.00 cm Rozann Lesches MD Electronically signed by Rozann Lesches MD Signature Date/Time: 07/16/2020/10:26:07 AM    Final    ECHOCARDIOGRAM COMPLETE  Result Date: 06/18/2020    ECHOCARDIOGRAM REPORT   Patient Name:   KEASHA SIRMONS Stys Date of Exam: 06/18/2020 Medical Rec #:  [id number]    Height:       67.0 in Accession #:    [id number]   Weight:       215.6 lb Date of Birth:  [birth year]   BSA:          2.088 m Patient Age:    75 years     BP:           128/79 mmHg Patient Gender: F            HR:           61 bpm. Exam Location:  Outpatient Procedure: 2D Echo, 3D Echo, Cardiac Doppler, Color Doppler and Strain Analysis Indications:    Z51.11 Encounter for antineoplastic chemotheraphy  History:        Patient  has prior history of Echocardiogram examinations, most                 recent 03/28/2020. Pulmonary embolus.  Sonographer:    Roseanna Rainbow Referring Phys: [id number] NI [place]  Sonographer Comments: Global longitudinal strain was attempted. IMPRESSIONS  1. Left ventricular ejection fraction, by estimation, is 60 to 65%. The left  ventricle has normal function. The left ventricle has no regional wall motion abnormalities. There is mild left ventricular hypertrophy of the basal-septal segment. Left ventricular diastolic parameters were normal. The average left ventricular global longitudinal strain is -25.0 %. The global longitudinal strain is normal.  2. Right ventricular systolic function is normal. The right ventricular size is normal.  3. The mitral valve is grossly normal. Mild mitral valve regurgitation.  4. Tricuspid valve regurgitation is moderate.  5. The aortic valve is tricuspid. There is mild calcification of the aortic valve. There is mild thickening of the aortic valve. Aortic valve regurgitation is mild to moderate.  6. The inferior vena cava is normal in size with <50% respiratory variability, suggesting right atrial pressure of 8 mmHg. Comparison(s): No significant change from prior study. FINDINGS  Left Ventricle: Left ventricular ejection fraction, by estimation, is 60 to 65%. The left ventricle has normal function. The left ventricle has no regional wall motion abnormalities. The average left ventricular global longitudinal strain is -25.0 %. The global longitudinal strain is normal. The left ventricular internal cavity size was normal in size. There is mild left ventricular hypertrophy of the basal-septal segment. Left ventricular diastolic parameters were normal. Right Ventricle: The right ventricular size is normal. Right vetricular wall thickness was not assessed. Right ventricular systolic function is normal. Left Atrium: Left atrial size was normal in size. Right Atrium: Right atrial size was normal  in size. Pericardium: There is no evidence of pericardial effusion. Mitral Valve: The mitral valve is grossly normal. There is mild thickening of the mitral valve leaflet(s). There is mild calcification of the mitral valve leaflet(s). Mild mitral valve regurgitation. Tricuspid Valve: The tricuspid valve is normal in structure. Tricuspid valve regurgitation is moderate. Aortic Valve: The aortic valve is tricuspid. There is mild calcification of the aortic valve. There is mild thickening of the aortic valve. Aortic valve regurgitation is mild to moderate. Aortic regurgitation PHT measures 464 msec. Pulmonic Valve: The pulmonic valve was normal in structure. Pulmonic valve regurgitation is trivial. Aorta: The aortic root and ascending aorta are structurally normal, with no evidence of dilitation. Venous: The inferior vena cava is normal in size with less than 50% respiratory variability, suggesting right atrial pressure of 8 mmHg. IAS/Shunts: No atrial level shunt detected by color flow Doppler.  LEFT VENTRICLE PLAX 2D LVIDd:         4.00 cm      Diastology LVIDs:         2.60 cm      LV e' medial:    8.49 cm/s LV PW:         1.30 cm      LV E/e' medial:  10.2 LV IVS:        1.10 cm      LV e' lateral:   12.30 cm/s LVOT diam:     2.00 cm      LV E/e' lateral: 7.0 LV SV:         65 LV SV Index:   31           2D Longitudinal Strain LVOT Area:     3.14 cm     2D Strain GLS (A2C):   -26.0 %                             2D Strain GLS (A3C):   -25.1 %  2D Strain GLS (A4C):   -24.0 % LV Volumes (MOD)            2D Strain GLS Avg:     -25.0 % LV vol d, MOD A2C: 107.0 ml LV vol d, MOD A4C: 76.0 ml LV vol s, MOD A2C: 30.7 ml LV vol s, MOD A4C: 27.4 ml LV SV MOD A2C:     76.3 ml LV SV MOD A4C:     76.0 ml LV SV MOD BP:      67.6 ml RIGHT VENTRICLE             IVC RV S prime:     11.10 cm/s  IVC diam: 2.10 cm TAPSE (M-mode): 2.2 cm LEFT ATRIUM           Index LA diam:      3.10 cm 1.48 cm/m LA Vol  (A2C): 18.0 ml 8.62 ml/m LA Vol (A4C): 49.5 ml 23.71 ml/m  AORTIC VALVE LVOT Vmax:   96.10 cm/s LVOT Vmean:  59.900 cm/s LVOT VTI:    0.206 m AI PHT:      464 msec  AORTA Ao Root diam: 3.20 cm Ao Asc diam:  3.10 cm MITRAL VALVE MV Area (PHT): 6.32 cm    SHUNTS MV Decel Time: 120 msec    Systemic VTI:  0.21 m MV E velocity: 86.40 cm/s  Systemic Diam: 2.00 cm MV A velocity: 63.80 cm/s MV E/A ratio:  1.35 Gwyndolyn Kaufman MD Electronically signed by Gwyndolyn Kaufman MD Signature Date/Time: 06/18/2020/1:39:11 PM    Final      CBC Recent Labs  Lab 07/15/20 1730 07/16/20 0448  WBC 4.3 3.0*  HGB 10.3* 8.8*  HCT 30.8* 26.1*  PLT 95* 84*  MCV 113.7* 114.5*  MCH 38.0* 38.6*  MCHC 33.4 33.7  RDW 16.9* 16.6*  LYMPHSABS 1.8  --   MONOABS 0.8  --   EOSABS 0.0  --   BASOSABS 0.0  --     Chemistries  Recent Labs  Lab 07/15/20 1730 07/16/20 0448  NA 136 136  K 3.4* 3.8  CL 101 101  CO2 26 26  GLUCOSE 113* 111*  BUN 16 14  CREATININE 0.88 0.80  CALCIUM 9.6 9.6  MG  --  1.2*  AST 25 29  ALT 24 27  ALKPHOS 79 68  BILITOT 0.4 0.6   ------------------------------------------------------------------------------------------------------------------ No results for input(s): CHOL, HDL, LDLCALC, TRIG, CHOLHDL, LDLDIRECT in the last 72 hours.  No results found for: HGBA1C ------------------------------------------------------------------------------------------------------------------ Recent Labs    07/16/20 0448  TSH 7.694*   ------------------------------------------------------------------------------------------------------------------ Recent Labs    07/16/20 0448  VITAMINB12 188  FOLATE 15.3  FERRITIN 176  TIBC 319  IRON 52  RETICCTPCT 3.2*    Coagulation profile No results for input(s): INR, PROTIME in the last 168 hours.  No results for input(s): DDIMER in the last 72 hours.  Cardiac Enzymes No results for input(s): CKMB, TROPONINI, MYOGLOBIN in the last 168  hours.  Invalid input(s): CK ------------------------------------------------------------------------------------------------------------------ No results found for: BNP   Roxan Hockey M.D on 07/16/2020 at 11:44 AM  Go to [email or web address] - for contact info  [facility] - Office  [phone]

## 2020-07-16 NOTE — ED Notes (Signed)
Pt denies chest pain and SOB at this time

## 2020-07-16 NOTE — Progress Notes (Signed)
ANTICOAGULATION CONSULT NOTE   Pharmacy Consult for Heparin Indication: pulmonary embolus  No Known Allergies  Patient Measurements: Height: 5\' 7"  (170.2 cm) Weight: 96.3 kg (212 lb 4.9 oz) IBW/kg (Calculated) : 61.6 Heparin Dosing Weight: 82.9 kg   Vital Signs: Temp: 98 F (36.7 C) (01/18 1324) Temp Source: Oral (01/18 1324) BP: 106/45 (01/18 1324) Pulse Rate: 76 (01/18 1324)  Labs: Recent Labs    07/15/20 1730 07/15/20 1859 07/16/20 0448 07/16/20 1534  HGB 10.3*  --  8.8*  --   HCT 30.8*  --  26.1*  --   PLT 95*  --  84*  --   HEPARINUNFRC  --   --  0.28* 0.38  CREATININE 0.88  --  0.80  --   TROPONINIHS 7 8  --   --     Estimated Creatinine Clearance: 70.2 mL/min (by C-G formula based on SCr of 0.8 mg/dL).   Medical History: Past Medical History:  Diagnosis Date  . Back pain   . DVT (deep venous thrombosis) ([place]) 02/02/2019   had DVT first that moved into lung  . Family history of stomach cancer   . Family history of stomach cancer   . GERD (gastroesophageal reflux disease)   . History of pulmonary embolus (PE) 02/02/2019   confirmed by CT chest- had DVT first that moved into lungs per patient  . Hypertension   . Pessary maintenance   . Stroke [facility]     Assessment: 80 yo female presented on 07/15/2020 with sharp left sided chest pain (worse with inspiration). CT obtained found mild amount of acute pulmonary embolism within multiple middle lobe and lower lobe branches of the right pulmonary artery, no RHS. Patient has a hx of DVT and PE in August 2020 and is not on anticoagulation prior to admission - pt reported was treated for 1 yr with apixaban. Patient also has ovarian cancer.   Pharmacy consulted to start heparin for PE. Hgb 10.3. Plt 95 - was 213 on 07/04/2020  HL 0.38, therapeutic  Goal of Therapy:  Heparin level 0.3-0.7 units/ml Monitor platelets by anticoagulation protocol: Yes   Plan:  Continue heparin at 1500 units/hr Heparin level  daily Monitor for s/s of bleeding F/U transition to PO tx  Isac Sarna, BS [place], BCPS Clinical Pharmacist Pager [phone]

## 2020-07-16 NOTE — Progress Notes (Signed)
*  PRELIMINARY RESULTS* Echocardiogram 2D Echocardiogram has been performed.  Paula Peterson 07/16/2020, 10:14 AM

## 2020-07-16 NOTE — ED Notes (Signed)
MD at bedside. 

## 2020-07-17 LAB — HEPARIN LEVEL (UNFRACTIONATED): Heparin Unfractionated: 0.62 IU/mL (ref 0.30–0.70)

## 2020-07-17 LAB — COMPREHENSIVE METABOLIC PANEL
ALT: 53 U/L — ABNORMAL HIGH (ref 0–44)
AST: 48 U/L — ABNORMAL HIGH (ref 15–41)
Albumin: 3 g/dL — ABNORMAL LOW (ref 3.5–5.0)
Alkaline Phosphatase: 72 U/L (ref 38–126)
Anion gap: 9 (ref 5–15)
BUN: 18 mg/dL (ref 8–23)
CO2: 26 mmol/L (ref 22–32)
Calcium: 9.6 mg/dL (ref 8.9–10.3)
Chloride: 103 mmol/L (ref 98–111)
Creatinine, Ser: 0.91 mg/dL (ref 0.44–1.00)
GFR, Estimated: >60 mL/min (ref >60)
Glucose, Bld: 104 mg/dL — ABNORMAL HIGH (ref 70–99)
Potassium: 3.8 mmol/L (ref 3.5–5.1)
Sodium: 138 mmol/L (ref 135–145)
Total Bilirubin: 0.3 mg/dL (ref 0.3–1.2)
Total Protein: 6 g/dL — ABNORMAL LOW (ref 6.5–8.1)

## 2020-07-17 LAB — CBC
HCT: 25.9 % — ABNORMAL LOW (ref 36.0–46.0)
Hemoglobin: 8.6 g/dL — ABNORMAL LOW (ref 12.0–15.0)
MCH: 38.2 pg — ABNORMAL HIGH (ref 26.0–34.0)
MCHC: 33.2 g/dL (ref 30.0–36.0)
MCV: 115.1 fL — ABNORMAL HIGH (ref 80.0–100.0)
Platelets: 88 10*3/uL — ABNORMAL LOW (ref 150–400)
RBC: 2.25 MIL/uL — ABNORMAL LOW (ref 3.87–5.11)
RDW: 16.1 % — ABNORMAL HIGH (ref 11.5–15.5)
WBC: 2.9 10*3/uL — ABNORMAL LOW (ref 4.0–10.5)
nRBC: 0.7 % — ABNORMAL HIGH (ref 0.0–0.2)

## 2020-07-17 LAB — FOLATE: Folate: 11.9 ng/mL (ref >5.9)

## 2020-07-17 MED ORDER — PANTOPRAZOLE SODIUM 40 MG PO TBEC: 40.0000 mg | DELAYED_RELEASE_TABLET | Freq: Every day | ORAL | 4 refills | Status: DC

## 2020-07-17 MED ORDER — APIXABAN 5 MG PO TABS
10.0000 mg | ORAL_TABLET | Freq: Two times a day (BID) | ORAL | Status: DC
  Administered 2020-07-17: 10 mg via ORAL
  Filled 2020-07-17: qty 2

## 2020-07-17 MED ORDER — CYANOCOBALAMIN 1000 MCG PO TABS: 1000.0000 ug | ORAL_TABLET | Freq: Every day | ORAL | 3 refills | Status: DC

## 2020-07-17 MED ORDER — APIXABAN 5 MG PO TABS: 5.0000 mg | ORAL_TABLET | Freq: Two times a day (BID) | ORAL | Status: DC

## 2020-07-17 MED ORDER — SENNOSIDES-DOCUSATE SODIUM 8.6-50 MG PO TABS: 2.0000 | ORAL_TABLET | Freq: Every day | ORAL | 3 refills | Status: DC

## 2020-07-17 MED ORDER — LEVOTHYROXINE SODIUM 75 MCG PO TABS: 75.0000 ug | ORAL_TABLET | Freq: Every day | ORAL | 3 refills | Status: AC

## 2020-07-17 MED ORDER — APIXABAN 5 MG PO TABS: 5.0000 mg | ORAL_TABLET | Freq: Two times a day (BID) | ORAL | 5 refills | Status: DC

## 2020-07-17 MED ORDER — ELIQUIS DVT/PE STARTER PACK 5 MG PO TBPK: ORAL_TABLET | ORAL | 0 refills | Status: DC

## 2020-07-17 NOTE — Discharge Summary (Signed)
Paula Peterson, is a 80 y.o. female  DOB 1941-05-03  MRN [id number].  Admission date:  07/15/2020  Admitting Physician  Rolla Plate, DO  Discharge Date:  07/17/2020   Primary MD  Caryl Bis, MD  Recommendations for primary care physician for things to follow:   1)Avoid ibuprofen/Advil/Aleve/Motrin/Goody Powders/Naproxen/BC powders/Meloxicam/Diclofenac/Indomethacin and other Nonsteroidal anti-inflammatory medications as these will make you more likely to bleed and can cause stomach ulcers, can also cause Kidney problems.   2)Advise weekly CBC starting 07/22/20----  3)Stop Celebrex, Stop Aspirin---  4)You are taking Apixaban/Eliquis which is a Blood Thinner--please call is dark stools, nose bleeds or other bleeding concerns  Admission Diagnosis  Shortness of breath [R06.02] Pulmonary embolism (HCC) [I26.99]   Discharge Diagnosis  Shortness of breath [R06.02] Pulmonary embolism ([place]) [I26.99]    Principal Problem:   PE (pulmonary thromboembolism) ([place]) Active Problems:   Other constipation   Acquired hypothyroidism   Pulmonary embolism (HCC)   Hypokalemia   Essential hypertension      Past Medical History:  Diagnosis Date  . Back pain   . DVT (deep venous thrombosis) ([place]) 02/02/2019   had DVT first that moved into lung  . Family history of stomach cancer   . Family history of stomach cancer   . GERD (gastroesophageal reflux disease)   . History of pulmonary embolus (PE) 02/02/2019   confirmed by CT chest- had DVT first that moved into lungs per patient  . Hypertension   . Pessary maintenance   . Stroke [facility])     Past Surgical History:  Procedure Laterality Date  . ABDOMINAL HYSTERECTOMY     ovaries left  . BACK SURGERY    . DEBULKING Bilateral 06/06/2019   Procedure: RADICAL TUMOR DEBULKING;  Surgeon: Everitt Amber, MD;  Location: [facility];  Service: Gynecology;  Laterality:  Bilateral;  . FRACTURE SURGERY    . IR IMAGING GUIDED PORT INSERTION  12/15/2018  . OMENTECTOMY Bilateral 06/06/2019   Procedure: OMENTECTOMY;  Surgeon: Everitt Amber, MD;  Location: [facility];  Service: Gynecology;  Laterality: Bilateral;  . SALPINGOOPHORECTOMY Bilateral 06/06/2019   Procedure: EXPLORATORY LAPAROTOMY,OPEN RIGHT SALPINGO OOPHORECTOMY;  Surgeon: Everitt Amber, MD;  Location: [facility];  Service: Gynecology;  Laterality: Bilateral;       HPI  from the history and physical done on the day of admission:      Paula Peterson  is a 80 y.o. 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  Patient reports that pain actually started last night with left-sided thoracic level back pain.  Patient reports that she has chronic back pain but this felt different than her normal.  Any positional change in the night made the pain worse.  She did take Tylenol and this did not offer any relief.  And this morning she started to have left-sided chest pain under her breast.  Patient reports that there are  2 separate pains, there is no radiation of her back pain through to her chest.  Patient reports her chest pain is worse with deep inspiration.  She describes her pain is moderate at rest, severe with deep inspiration, and the pain is sharp. She has an associated shortness of breath with ambulation and speaking.  Patient denies any cough, dizziness, palpitations.  Patient reports that this feels similar to previous PE.  Patient does have a history of ovarian cancer.  She was diagnosed in 2020.  The decision was made to have 7 cycles of carboplatin and Taxol prior to surgery, and then 3 cycles after surgery.  Patient developed her PE and DVT in August of 2020.  She was on Eliquis for a year.  Patient did have her exploratory laparotomy with right  salpingo-oophorectomy, omentectomy, radical tumor debulking for ovarian cancer.  Her last follow-up with oncology was in December 2021.  It was decided to the 3 postsurgery cycles.  Last chemotherapy was on 06/25/2020.  Patient reports that she has not had any nausea or decrease in her appetite since the chemo.  Prior to restarting chemo they did get an echo which revealed an ejection fraction of 60 to [id number], normal diastolic parameters.  Patient was also noted to have anemia and they have been following CBC with chemotherapy.  In the ED Patient is afebrile, heart rate 82, respiratory rate 19, blood pressure 133/62, satting at 99% Hematology shows a hemoglobin 10.3, white blood cell count 4.3, platelets 95 Chemistry panel is mostly unremarkable with a hypokalemia that is mild at 3.4 COVID is pending CTA chest did reveal acute and chronic pulmonary emboli. EKG shows sinus rhythm at 82, QTC of 419 Heparin restarted per pharmacy consult Patient was ambulated and noted to become markedly short of breath, but she has not had an oxygen requirement thus far Patient took 324 mg of aspirin prior to arrival in the ED     Hospital Course:      Brief Summary:- 80 y.o.female,with a past medical history significant for ovarian cancer,anemia, GERD, hypertension, CVA, history of DVT/PE admitted on 07/15/2020 acute on chronic pulmonary embolism   A/p 1) acute on chronic pulmonary embolism----given underlying ovarian cancer and prior history of DVT and PE patient will need lifelong anticoagulation  this time around -Please note that patient previously treated with Eliquis for about a year -Treated with IV heparin through 07/17/2020 - discharge home on Eliquis on 07/17/2020 -Echocardiogram with preserved EF of 60 to 65% without significant right heart strain -Lower extremity venous Dopplers without DVT -Chemo and neoplastic thrombocytopenia is stable  2)Pancytopenia-- -Chemo and neoplastic  thrombocytopenia and anemia is stable -Ferritin is not low, serum iron, TIBC and iron saturation are WNL -B12 is low normal---  B12 pills advised  3) hypothyroidism--TSH is 7.69 , increase levothyroxine to 75 mcg daily  4) HTN--resume losartan   5) hypomagnesemia/hypokalemia--- replete and recheck  Disposition--discharge home on Eliquis, weekly CBC checks advised   Disposition: The patient is from: Home  Anticipated d/c is to: Home     Code Status :  -  Code Status: Full Code   Family Communication:    (patient is alert, awake and coherent)  -Discussed with husband at bedside  Consults  :  pharmacy  Discharge Condition: stable  Follow UP   Follow-up Information    [name], MD. Schedule an appointment as soon as possible for a visit on 07/22/2020.   Specialty: Hematology and Oncology Contact information: [address]  [place] [address] [phone]        [name], MD. Schedule an appointment as soon as possible for a visit on 07/22/2020.   Specialty: Hematology and Oncology Contact information: [place] [address] [phone]                Diet and Activity recommendation:  As advised  Discharge Instructions     Discharge Instructions    Call MD for:  difficulty breathing, headache or visual disturbances   Complete by: As directed    Call MD for:  persistant dizziness or light-headedness   Complete by: As directed    Call MD for:  persistant nausea and vomiting   Complete by: As directed    Call MD for:  temperature >100.4   Complete by: As directed    Diet - low sodium heart healthy   Complete by: As directed    Discharge instructions   Complete by: As directed    1)Avoid ibuprofen/Advil/Aleve/Motrin/Goody Powders/Naproxen/BC powders/Meloxicam/Diclofenac/Indomethacin and other Nonsteroidal anti-inflammatory medications as these will make you more likely to bleed  and can cause stomach ulcers, can also cause Kidney problems.   2)Advise weekly CBC starting 07/22/20----  3)Stop Celebrex, Stop Aspirin---  4)You are taking Apixaban/Eliquis which is a Blood Thinner--please call is dark stools, nose bleeds or other bleeding concerns   Increase activity slowly   Complete by: As directed         Discharge Medications     Allergies as of 07/17/2020   No Known Allergies     Medication List    STOP taking these medications   amoxicillin 500 MG capsule Commonly known as: AMOXIL   aspirin EC 81 MG tablet   celecoxib 200 MG capsule Commonly known as: CELEBREX   ranitidine 75 MG tablet Commonly known as: ZANTAC     TAKE these medications   acetaminophen 325 MG tablet Commonly known as: TYLENOL Take 650 mg by mouth every 6 (six) hours as needed for moderate pain or headache.   cyanocobalamin 1000 MCG tablet Take 1 tablet (1,000 mcg total) by mouth daily. Start taking on: July 18, 2020   Eliquis DVT/PE Starter Pack Generic drug: Apixaban Starter Pack (10mg  and 5mg ) Take as directed on package: start with two-5mg  tablets twice daily for 7 days. On day 8, switch to one-5mg  tablet twice daily.   apixaban 5 MG Tabs tablet Commonly known as: ELIQUIS Take 1 tablet (5 mg total) by mouth 2 (two) times daily. --Start around 08/14/20 after you complete your initial starter pack Start taking on: August 14, 2020   levothyroxine 75 MCG tablet Commonly known as: SYNTHROID Take 1 tablet (75 mcg total) by mouth daily before breakfast. Start taking on: July 18, 2020 What changed:   medication strength  how much to take  when to take this  Another medication with the same name was removed. Continue taking this medication, and follow the directions you see here.   lidocaine-prilocaine cream Commonly known as: EMLA Apply to affected area once What changed:   how much to take  how to take this  when to take this  reasons to take  this  additional instructions   melatonin 3 MG Tabs tablet Take by mouth.   ondansetron 8 MG tablet Commonly known as: ZOFRAN Take 8 mg by mouth every 8 (eight) hours as needed.   pantoprazole 40 MG tablet Commonly known as: PROTONIX Take 1 tablet (40 mg total) by mouth daily.  Start taking on: July 18, 2020   prochlorperazine 10 MG tablet Commonly known as: COMPAZINE Take 10 mg by mouth every 6 (six) hours as needed.   senna-docusate 8.6-50 MG tablet Commonly known as: Senokot-S Take 2 tablets by mouth at bedtime. What changed: See the new instructions.       Major procedures and Radiology Reports - PLEASE review detailed and final reports for all details, in brief -    CT Angio Chest PE W and/or Wo Contrast  Result Date: 07/15/2020 CLINICAL DATA:  Hervey Ard left-sided chest pain. EXAM: CT ANGIOGRAPHY CHEST WITH CONTRAST TECHNIQUE: Multidetector CT imaging of the chest was performed using the standard protocol during bolus administration of intravenous contrast. Multiplanar CT image reconstructions and MIPs were obtained to evaluate the vascular anatomy. CONTRAST:  147mL OMNIPAQUE IOHEXOL 350 MG/ML SOLN COMPARISON:  September 20, 2019 FINDINGS: Cardiovascular: A right-sided venous Port-A-Cath is in place. Very mild calcification of the aortic arch is seen. Mild areas of intraluminal low attenuation are seen within multiple middle lobe and lower lobe branches of the right pulmonary artery (axial CT images 55 through 77, CT series number 4). A very small curvilinear area of intraluminal low attenuation is also seen within a distal segmental branch of the right pulmonary artery. A small amount of pulmonary embolism is seen within this region on the prior study. No saddle embolus is identified. Normal heart size without evidence of heart strain. No pericardial effusion. Mediastinum/Nodes: No enlarged mediastinal, hilar, or axillary lymph nodes. Thyroid gland, trachea, and esophagus  demonstrate no significant findings. Lungs/Pleura: Mild biapical scarring and/or atelectasis is seen. Mild posterior left basilar atelectasis and/or infiltrate is also noted. There is a small left pleural effusion. No pneumothorax is identified. Upper Abdomen: No acute abnormality. Musculoskeletal: No chest wall abnormality. No acute or significant osseous findings. Review of the MIP images confirms the above findings. IMPRESSION: 1. Mild amount of acute pulmonary embolism within multiple middle lobe and lower lobe branches of the right pulmonary artery. 2.   Small amount of chronic right pulmonary embolism. 3.   Mild posterior left basilar atelectasis and/or infiltrate. 4.   Small left pleural effusion. 5. Aortic atherosclerosis. Aortic Atherosclerosis (ICD10-I70.0). Electronically Signed   By: Virgina Norfolk M.D.   On: 07/15/2020 18:57   US Venous Img Lower Bilateral (DVT)  Result Date: 07/16/2020 CLINICAL DATA:  Pulmonary embolism discovered on CT in this 80 year old female EXAM: Bilateral LOWER EXTREMITY VENOUS DOPPLER ULTRASOUND TECHNIQUE: Gray-scale sonography with compression, as well as color and duplex ultrasound, were performed to evaluate the deep venous system(s) from the level of the common femoral vein through the popliteal and proximal calf veins. COMPARISON:  CT of the chest of July 15, 2020 FINDINGS: VENOUS Normal compressibility of the common femoral, superficial femoral, and popliteal veins, as well as the visualized calf veins. Visualized portions of profunda femoral vein and great saphenous vein unremarkable. No filling defects to suggest DVT on grayscale or color Doppler imaging. Doppler waveforms show normal direction of venous flow, normal respiratory plasticity and response to augmentation. OTHER None. Limitations: none IMPRESSION: Negative for lower extremity deep venous thrombosis in the bilateral lower extremities. Electronically Signed   By: Zetta Bills M.D.   On: 07/16/2020  09:50   ECHOCARDIOGRAM COMPLETE  Result Date: 07/16/2020    ECHOCARDIOGRAM REPORT   Patient Name:   JANA SWARTZLANDER Judon Date of Exam: 07/16/2020 Medical Rec #:  [id number]    Height:       67.0 in Accession #:  [id number]   Weight:       213.0 lb Date of Birth:  Sep 14, 1940   BSA:          2.077 m Patient Age:    71 years     BP:           115/57 mmHg Patient Gender: F            HR:           73 bpm. Exam Location:  [facility] Procedure: 2D Echo, Cardiac Doppler and Color Doppler Indications:    Pulmonary Embolus 415.19 / I26.99  History:        Patient has prior history of Echocardiogram examinations, most                 recent 06/18/2020. Stroke; Risk Factors:Hypertension. DVT (deep                 venous thrombosis) (HCC) (From Hx), History of pulmonary                 embolus, Encounter for antineoplastic chemotheraphy, Right                 ovarian epithelial cancer.  Sonographer:    Alvino Chapel RCS Referring Phys: [id number] ASIA B [place]  1. Left ventricular ejection fraction, by estimation, is 60 to 65%. The left ventricle has normal function. The left ventricle has no regional wall motion abnormalities. There is mild left ventricular hypertrophy. Left ventricular diastolic parameters are indeterminate.  2. Right ventricular systolic function is normal. The right ventricular size is normal. There is normal pulmonary artery systolic pressure. The estimated right ventricular systolic pressure is 61.9 mmHg.  3. The mitral valve is grossly normal. Trivial mitral valve regurgitation.  4. The aortic valve is tricuspid. Aortic valve regurgitation is mild.  5. The inferior vena cava is normal in size with greater than 50% respiratory variability, suggesting right atrial pressure of 3 mmHg. FINDINGS  Left Ventricle: Left ventricular ejection fraction, by estimation, is 60 to 65%. The left ventricle has normal function. The left ventricle has no regional wall motion abnormalities. The left ventricular  internal cavity size was normal in size. There is  mild left ventricular hypertrophy. Left ventricular diastolic parameters are indeterminate. Right Ventricle: The right ventricular size is normal. No increase in right ventricular wall thickness. Right ventricular systolic function is normal. There is normal pulmonary artery systolic pressure. The tricuspid regurgitant velocity is 1.86 m/s, and  with an assumed right atrial pressure of 3 mmHg, the estimated right ventricular systolic pressure is 50.9 mmHg. Left Atrium: Left atrial size was normal in size. Right Atrium: Right atrial size was normal in size. Pericardium: There is no evidence of pericardial effusion. Presence of pericardial fat pad. Mitral Valve: The mitral valve is grossly normal. Trivial mitral valve regurgitation. Tricuspid Valve: The tricuspid valve is grossly normal. Tricuspid valve regurgitation is trivial. Aortic Valve: The aortic valve is tricuspid. There is mild aortic valve annular calcification. Aortic valve regurgitation is mild. Aortic regurgitation PHT measures 496 msec. Pulmonic Valve: The pulmonic valve was grossly normal. Pulmonic valve regurgitation is trivial. Aorta: The aortic root is normal in size and structure. Venous: The inferior vena cava is normal in size with greater than 50% respiratory variability, suggesting right atrial pressure of 3 mmHg. IAS/Shunts: No atrial level shunt detected by color flow Doppler.  LEFT VENTRICLE PLAX 2D LVIDd:         4.60 cm  Diastology LVIDs:         2.40 cm  LV e' medial:    6.20 cm/s LV PW:         1.10 cm  LV E/e' medial:  10.5 LV IVS:        1.00 cm  LV e' lateral:   9.90 cm/s LVOT diam:     2.00 cm  LV E/e' lateral: 6.6 LV SV:         78 LV SV Index:   37 LVOT Area:     3.14 cm  RIGHT VENTRICLE RV S prime:     11.00 cm/s TAPSE (M-mode): 2.1 cm LEFT ATRIUM             Index       RIGHT ATRIUM           Index LA diam:        3.30 cm 1.59 cm/m  RA Area:     18.40 cm LA Vol (A2C):   43.9 ml  21.14 ml/m RA Volume:   46.50 ml  22.39 ml/m LA Vol (A4C):   64.7 ml 31.15 ml/m LA Biplane Vol: 54.8 ml 26.38 ml/m  AORTIC VALVE LVOT Vmax:   111.00 cm/s LVOT Vmean:  77.400 cm/s LVOT VTI:    0.247 m AI PHT:      496 msec  AORTA Ao Root diam: 3.20 cm MITRAL VALVE               TRICUSPID VALVE MV Area (PHT): 3.21 cm    TR Peak grad:   13.8 mmHg MV Decel Time: 236 msec    TR Vmax:        186.00 cm/s MV E velocity: 65.20 cm/s MV A velocity: 80.60 cm/s  SHUNTS MV E/A ratio:  0.81        Systemic VTI:  0.25 m                            Systemic Diam: 2.00 cm Rozann Lesches MD Electronically signed by Rozann Lesches MD Signature Date/Time: 07/16/2020/10:26:07 AM    Final    ECHOCARDIOGRAM COMPLETE  Result Date: 06/18/2020    ECHOCARDIOGRAM REPORT   Patient Name:   LILYAHNA RISINGER Petrea Date of Exam: 06/18/2020 Medical Rec #:  [id number]    Height:       67.0 in Accession #:    [id number]   Weight:       215.6 lb Date of Birth:  1940-08-26   BSA:          2.088 m Patient Age:    89 years     BP:           128/79 mmHg Patient Gender: F            HR:           61 bpm. Exam Location:  [facility]patient Procedure: 2D Echo, 3D Echo, Cardiac Doppler, Color Doppler and Strain Analysis Indications:    Z51.11 Encounter for antineoplastic chemotheraphy  History:        Patient has prior history of Echocardiogram examinations, most                 recent 03/28/2020. Pulmonary embolus.  Sonographer:    Roseanna Rainbow Referring Phys: [id number] NI [place]  Sonographer Comments: Global longitudinal strain was attempted. IMPRESSIONS  1. Left ventricular ejection fraction, by estimation, is 60 to 65%. The left ventricle has normal  function. The left ventricle has no regional wall motion abnormalities. There is mild left ventricular hypertrophy of the basal-septal segment. Left ventricular diastolic parameters were normal. The average left ventricular global longitudinal strain is -25.0 %. The global longitudinal strain is normal.  2. Right ventricular  systolic function is normal. The right ventricular size is normal.  3. The mitral valve is grossly normal. Mild mitral valve regurgitation.  4. Tricuspid valve regurgitation is moderate.  5. The aortic valve is tricuspid. There is mild calcification of the aortic valve. There is mild thickening of the aortic valve. Aortic valve regurgitation is mild to moderate.  6. The inferior vena cava is normal in size with <50% respiratory variability, suggesting right atrial pressure of 8 mmHg. Comparison(s): No significant change from prior study. FINDINGS  Left Ventricle: Left ventricular ejection fraction, by estimation, is 60 to 65%. The left ventricle has normal function. The left ventricle has no regional wall motion abnormalities. The average left ventricular global longitudinal strain is -25.0 %. The global longitudinal strain is normal. The left ventricular internal cavity size was normal in size. There is mild left ventricular hypertrophy of the basal-septal segment. Left ventricular diastolic parameters were normal. Right Ventricle: The right ventricular size is normal. Right vetricular wall thickness was not assessed. Right ventricular systolic function is normal. Left Atrium: Left atrial size was normal in size. Right Atrium: Right atrial size was normal in size. Pericardium: There is no evidence of pericardial effusion. Mitral Valve: The mitral valve is grossly normal. There is mild thickening of the mitral valve leaflet(s). There is mild calcification of the mitral valve leaflet(s). Mild mitral valve regurgitation. Tricuspid Valve: The tricuspid valve is normal in structure. Tricuspid valve regurgitation is moderate. Aortic Valve: The aortic valve is tricuspid. There is mild calcification of the aortic valve. There is mild thickening of the aortic valve. Aortic valve regurgitation is mild to moderate. Aortic regurgitation PHT measures 464 msec. Pulmonic Valve: The pulmonic valve was normal in structure. Pulmonic  valve regurgitation is trivial. Aorta: The aortic root and ascending aorta are structurally normal, with no evidence of dilitation. Venous: The inferior vena cava is normal in size with less than 50% respiratory variability, suggesting right atrial pressure of 8 mmHg. IAS/Shunts: No atrial level shunt detected by color flow Doppler.  LEFT VENTRICLE PLAX 2D LVIDd:         4.00 cm      Diastology LVIDs:         2.60 cm      LV e' medial:    8.49 cm/s LV PW:         1.30 cm      LV E/e' medial:  10.2 LV IVS:        1.10 cm      LV e' lateral:   12.30 cm/s LVOT diam:     2.00 cm      LV E/e' lateral: 7.0 LV SV:         65 LV SV Index:   31           2D Longitudinal Strain LVOT Area:     3.14 cm     2D Strain GLS (A2C):   -26.0 %                             2D Strain GLS (A3C):   -25.1 %  2D Strain GLS (A4C):   -24.0 % LV Volumes (MOD)            2D Strain GLS Avg:     -25.0 % LV vol d, MOD A2C: 107.0 ml LV vol d, MOD A4C: 76.0 ml LV vol s, MOD A2C: 30.7 ml LV vol s, MOD A4C: 27.4 ml LV SV MOD A2C:     76.3 ml LV SV MOD A4C:     76.0 ml LV SV MOD BP:      67.6 ml RIGHT VENTRICLE             IVC RV S prime:     11.10 cm/s  IVC diam: 2.10 cm TAPSE (M-mode): 2.2 cm LEFT ATRIUM           Index LA diam:      3.10 cm 1.48 cm/m LA Vol (A2C): 18.0 ml 8.62 ml/m LA Vol (A4C): 49.5 ml 23.71 ml/m  AORTIC VALVE LVOT Vmax:   96.10 cm/s LVOT Vmean:  59.900 cm/s LVOT VTI:    0.206 m AI PHT:      464 msec  AORTA Ao Root diam: 3.20 cm Ao Asc diam:  3.10 cm MITRAL VALVE MV Area (PHT): 6.32 cm    SHUNTS MV Decel Time: 120 msec    Systemic VTI:  0.21 m MV E velocity: 86.40 cm/s  Systemic Diam: 2.00 cm MV A velocity: 63.80 cm/s MV E/A ratio:  1.35 Laurance Flatten MD Electronically signed by Laurance Flatten MD Signature Date/Time: 06/18/2020/1:39:11 PM    Final     Micro Results    Recent Results (from the past 240 hour(s))  Resp Panel by RT-PCR (Flu A&B, Covid) Nasopharyngeal Swab     Status: None    Collection Time: 07/15/20  5:30 PM   Specimen: Nasopharyngeal Swab; Nasopharyngeal(NP) swabs in vial transport medium  Result Value Ref Range Status   SARS Coronavirus 2 by RT PCR NEGATIVE NEGATIVE Final    Comment: (NOTE) SARS-CoV-2 target nucleic acids are NOT DETECTED.  The SARS-CoV-2 RNA is generally detectable in upper respiratory specimens during the acute phase of infection. The lowest concentration of SARS-CoV-2 viral copies this assay can detect is 138 copies/mL. A negative result does not preclude SARS-Cov-2 infection and should not be used as the sole basis for treatment or other patient management decisions. A negative result may occur with  improper specimen collection/handling, submission of specimen other than nasopharyngeal swab, presence of viral mutation(s) within the areas targeted by this assay, and inadequate number of viral copies(<138 copies/mL). A negative result must be combined with clinical observations, patient history, and epidemiological information. The expected result is Negative.  Fact Sheet for Patients:  BloggerCourse.com  Fact Sheet for Healthcare Providers:  SeriousBroker.it  This test is no t yet approved or cleared by the [place] FDA and  has been authorized for detection and/or diagnosis of SARS-CoV-2 by FDA under an Emergency Use Authorization (EUA). This EUA will remain  in effect (meaning this test can be used) for the duration of the COVID-19 declaration under Section 564(b)(1) of the Act, 21 U.S.C.section 360bbb-3(b)(1), unless the authorization is terminated  or revoked sooner.       Influenza A by PCR NEGATIVE NEGATIVE Final   Influenza B by PCR NEGATIVE NEGATIVE Final    Comment: (NOTE) The Xpert Xpress SARS-CoV-2/FLU/RSV plus assay is intended as an aid in the diagnosis of influenza from Nasopharyngeal swab specimens and should not be used as a sole basis for  treatment.  Nasal washings and aspirates are unacceptable for Xpert Xpress SARS-CoV-2/FLU/RSV testing.  Fact Sheet for Patients: EntrepreneurPulse.com.au  Fact Sheet for Healthcare Providers: IncredibleEmployment.be  This test is not yet approved or cleared by the [place] FDA and has been authorized for detection and/or diagnosis of SARS-CoV-2 by FDA under an Emergency Use Authorization (EUA). This EUA will remain in effect (meaning this test can be used) for the duration of the COVID-19 declaration under Section 564(b)(1) of the Act, 21 U.S.C. section 360bbb-3(b)(1), unless the authorization is terminated or revoked.  Performed at [facility], [address], [place], [place] [address]     Today   Subjective    Sharline Schomburg today has no new complaints No fever  Or chills   No Nausea, Vomiting or Diarrhea         Patient has been seen and examined prior to discharge   Objective   Blood pressure 109/80, pulse 81, temperature (!) 97.4 F (36.3 C), resp. rate 18, height 5\' 7"  (1.702 m), weight 96.3 kg, SpO2 99 %.   Intake/Output Summary (Last 24 hours) at 07/17/2020 1156 Last data filed at 07/17/2020 0900 Gross per 24 hour  Intake 720 ml  Output -  Net 720 ml    Exam Gen:- Awake Alert, no acute distress  HEENT:- [place].AT, No sclera icterus Neck-Supple Neck,No JVD,.  Lungs-  CTAB , good air movement bilaterally  CV- S1, S2 normal, regular Abd-  +ve B.Sounds, Abd Soft, No tenderness,    Extremity/Skin:- No  edema,   good pulses Psych-affect is appropriate, oriented x3 Neuro-no new focal deficits, no tremors    Data Review   CBC w Diff:  Lab Results  Component Value Date   WBC 2.9 (L) 07/17/2020   HGB 8.6 (L) 07/17/2020   HGB 10.0 (L) 07/04/2020   HCT 25.9 (L) 07/17/2020   PLT 88 (L) 07/17/2020   PLT 213 07/04/2020   LYMPHOPCT 42 07/15/2020   MONOPCT 19 07/15/2020   EOSPCT 0 07/15/2020   BASOPCT 1 07/15/2020    CMP:  Lab  Results  Component Value Date   NA 138 07/17/2020   K 3.8 07/17/2020   CL 103 07/17/2020   CO2 26 07/17/2020   BUN 18 07/17/2020   CREATININE 0.91 07/17/2020   CREATININE 1.02 (H) 06/25/2020   PROT 6.0 (L) 07/17/2020   ALBUMIN 3.0 (L) 07/17/2020   BILITOT 0.3 07/17/2020   BILITOT 0.3 06/25/2020   ALKPHOS 72 07/17/2020   AST 48 (H) 07/17/2020   AST 18 06/25/2020   ALT 53 (H) 07/17/2020   ALT 19 06/25/2020  .   Total Discharge time is about 33 minutes  Roxan Hockey M.D on 07/17/2020 at 11:56 AM  Go to [email or web address] -  for contact info  [facility] - Office  [phone]

## 2020-07-17 NOTE — Evaluation (Signed)
Physical Therapy Evaluation Patient Details Name: Paula Peterson MRN: [id number] DOB: 07-Nov-1940 Today's Date: 07/17/2020   History of Present Illness  Eileen Kangas  is a 80 y.o. 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  Patient reports that pain actually started last night with left-sided thoracic level back pain.  Patient reports that she has chronic back pain but this felt different than her normal.  Any positional change in the night made the pain worse.  She did take Tylenol and this did not offer any relief.  And this morning she started to have left-sided chest pain under her breast.  Patient reports that there are 2 separate pains, there is no radiation of her back pain through to her chest.  Patient reports her chest pain is worse with deep inspiration.  She describes her pain is moderate at rest, severe with deep inspiration, and the pain is sharp. She has an associated shortness of breath with ambulation and speaking.  Patient denies any cough, dizziness, palpitations.  Patient reports that this feels similar to previous PE.    Clinical Impression  Patient functioning at baseline for functional mobility and gait demonstrating good return for ambulation in room, hallways and on stairs without loss of balance.  Plan:  Patient discharged from physical therapy to care of nursing for ambulation daily as tolerated for length of stay.     Follow Up Recommendations No PT follow up;Supervision - Intermittent    Equipment Recommendations  None recommended by PT    Recommendations for Other Services       Precautions / Restrictions Precautions Precautions: None Restrictions Weight Bearing Restrictions: No      Mobility  Bed Mobility Overal bed mobility: Modified Independent                  Transfers Overall  transfer level: Modified independent                  Ambulation/Gait Ambulation/Gait assistance: Modified independent (Device/Increase time) Gait Distance (Feet): 100 Feet Assistive device: None Gait Pattern/deviations: Decreased step length - right;Decreased stride length Gait velocity: decreased   General Gait Details: grossly WFL with slightly labored cadence and decreased right stride length with excessive internal rotation which is baseline per patient due to old injury, no loss of balance and mostly limited due to increasing chronic right hip pain  Stairs Stairs: Yes Stairs assistance: Modified independent (Device/Increase time) Stair Management: One rail Left;One rail Right;Alternating pattern;Sideways Number of Stairs: 5 General stair comments: pateint demonstrates good return for going up stairs with alternating pattern using 1 siderail and stepping down steps sideways using 1 siderail without loss of balance  Wheelchair Mobility    Modified Rankin (Stroke Patients Only)       Balance Overall balance assessment: Mild deficits observed, not formally tested                                           Pertinent Vitals/Pain Pain Assessment: Faces Faces Pain Scale: Hurts a little bit Pain Location: chronic low back pain, increasing right hip pain when walking Pain Descriptors / Indicators: Aching;Sore;Discomfort Pain Intervention(s): Limited activity within patient's tolerance;Monitored during session;Repositioned    Home Living Family/patient expects to be discharged to:: Private residence Living  Arrangements: Spouse/significant other Available Help at Discharge: Family;Available 24 hours/day Type of Home: House Home Access: Stairs to enter Entrance Stairs-Rails: None Entrance Stairs-Number of Steps: 2 Home Layout: One level Home Equipment: Walker - 2 wheels;Cane - single point;Grab bars - tub/shower      Prior Function Level of  Independence: Independent         Comments: household and short distanced community ambulator, drives     Journalist, newspaper        Extremity/Trunk Assessment   Upper Extremity Assessment Upper Extremity Assessment: Overall WFL for tasks assessed    Lower Extremity Assessment Lower Extremity Assessment: Overall WFL for tasks assessed    Cervical / Trunk Assessment Cervical / Trunk Assessment: Normal  Communication   Communication: No difficulties  Cognition Arousal/Alertness: Awake/alert Behavior During Therapy: WFL for tasks assessed/performed Overall Cognitive Status: Within Functional Limits for tasks assessed                                        General Comments      Exercises     Assessment/Plan    PT Assessment Patent does not need any further PT services  PT Problem List         PT Treatment Interventions      PT Goals (Current goals can be found in the Care Plan section)  Acute Rehab PT Goals Patient Stated Goal: return home with family to assist PT Goal Formulation: With patient/family Time For Goal Achievement: 07/17/20 Potential to Achieve Goals: Good    Frequency     Barriers to discharge        Co-evaluation               AM-PAC PT "6 Clicks" Mobility  Outcome Measure Help needed turning from your back to your side while in a flat bed without using bedrails?: None Help needed moving from lying on your back to sitting on the side of a flat bed without using bedrails?: None Help needed moving to and from a bed to a chair (including a wheelchair)?: None Help needed standing up from a chair using your arms (e.g., wheelchair or bedside chair)?: None Help needed to walk in hospital room?: None Help needed climbing 3-5 steps with a railing? : A Little 6 Click Score: 23    End of Session   Activity Tolerance: Patient tolerated treatment well;Patient limited by pain Patient left: in bed;with call bell/phone within  reach;with family/visitor present Nurse Communication: Mobility status PT Visit Diagnosis: Unsteadiness on feet (R26.81);Other abnormalities of gait and mobility (R26.89);Muscle weakness (generalized) (M62.81)    Time: 8242-3536 PT Time Calculation (min) (ACUTE ONLY): 15 min   Charges:   PT Evaluation $PT Eval Low Complexity: 1 Low PT Treatments $Therapeutic Activity: 8-22 mins        10:02 AM, 07/17/20 Lonell Grandchild, MPT Physical Therapist with [facility] [phone] office [phone] mobile phone

## 2020-07-17 NOTE — Discharge Instructions (Signed)
1)Avoid ibuprofen/Advil/Aleve/Motrin/Goody Powders/Naproxen/BC powders/Meloxicam/Diclofenac/Indomethacin and other Nonsteroidal anti-inflammatory medications as these will make you more likely to bleed and can cause stomach ulcers, can also cause Kidney problems.   2)Advise weekly CBC starting 07/22/20----  3)Stop Celebrex, Stop Aspirin---  4)You are taking Apixaban/Eliquis which is a Blood Thinner--please call is dark stools, nose bleeds or other bleeding concerns

## 2020-07-17 NOTE — Progress Notes (Signed)
[facility] for Heparin--> Apixaban Indication: pulmonary embolus  No Known Allergies  Patient Measurements: Height: 5\' 7"  (170.2 cm) Weight: 96.3 kg (212 lb 4.9 oz) IBW/kg (Calculated) : 61.6 Heparin Dosing Weight: 82.9 kg   Vital Signs: Temp: 97.4 F (36.3 C) (01/19 0555) Temp Source: Oral (01/18 2118) BP: 109/80 (01/19 0555) Pulse Rate: 81 (01/19 0555)  Labs: Recent Labs    07/15/20 1730 07/15/20 1859 07/16/20 0448 07/16/20 1534 07/17/20 0325  HGB 10.3*  --  8.8*  --  8.6*  HCT 30.8*  --  26.1*  --  25.9*  PLT 95*  --  84*  --  88*  HEPARINUNFRC  --   --  0.28* 0.38 0.62  CREATININE 0.88  --  0.80  --  0.91  TROPONINIHS 7 8  --   --   --     Estimated Creatinine Clearance: 61.7 mL/min (by C-G formula based on SCr of 0.91 mg/dL).   Medical History: Past Medical History:  Diagnosis Date  . Back pain   . DVT (deep venous thrombosis) ([place]) 02/02/2019   had DVT first that moved into lung  . Family history of stomach cancer   . Family history of stomach cancer   . GERD (gastroesophageal reflux disease)   . History of pulmonary embolus (PE) 02/02/2019   confirmed by CT chest- had DVT first that moved into lungs per patient  . Hypertension   . Pessary maintenance   . Stroke [facility])     Assessment: 80 yo female presented on 07/15/2020 with sharp left sided chest pain (worse with inspiration). CT obtained found mild amount of acute pulmonary embolism within multiple middle lobe and lower lobe branches of the right pulmonary artery, no RHS. Patient has a hx of DVT and PE in August 2020 and is not on anticoagulation prior to admission - pt reported was treated for 1 yr with apixaban. Patient also has ovarian cancer.   Pharmacy consulted to start heparin for PE. Hgb 10.3. Plt 95 - was 213 on 07/04/2020  HL elevated, but plan to switch to eliquis.  Goal of Therapy:  Heparin level 0.3-0.7 units/ml Monitor platelets by anticoagulation  protocol: Yes   Plan:  D/C heparin Apixaban 10mg  po bid x 7 days, then 5mg  po bid Educate on eliquis Monitor for S/S of bleeding  Isac Sarna, BS Vena [place], BCPS Clinical Pharmacist Pager [phone]

## 2020-07-18 ENCOUNTER — Telehealth: Payer: Self-pay

## 2020-07-18 NOTE — Telephone Encounter (Signed)
She called and left a message. She was in the hospital for a PE.  Are her appts correct? She said something about canceling lab appt on 1/24 and moving to 1/27.

## 2020-07-18 NOTE — Telephone Encounter (Signed)
She has already called Melissa X I verified with her we can just keep all appt as is

## 2020-07-22 DIAGNOSIS — I8290 Acute embolism and thrombosis of unspecified vein: Secondary | ICD-10-CM | POA: Diagnosis not present

## 2020-07-25 ENCOUNTER — Inpatient Hospital Stay: Payer: Medicare Other

## 2020-07-25 ENCOUNTER — Inpatient Hospital Stay: Payer: Medicare Other | Admitting: Hematology and Oncology

## 2020-07-25 ENCOUNTER — Other Ambulatory Visit: Payer: Self-pay

## 2020-07-25 ENCOUNTER — Encounter: Payer: Self-pay | Admitting: Hematology and Oncology

## 2020-07-25 DIAGNOSIS — I2699 Other pulmonary embolism without acute cor pulmonale: Secondary | ICD-10-CM

## 2020-07-25 DIAGNOSIS — C561 Malignant neoplasm of right ovary: Secondary | ICD-10-CM

## 2020-07-25 DIAGNOSIS — R5383 Other fatigue: Secondary | ICD-10-CM | POA: Diagnosis not present

## 2020-07-25 DIAGNOSIS — D61818 Other pancytopenia: Secondary | ICD-10-CM | POA: Diagnosis not present

## 2020-07-25 DIAGNOSIS — Z5111 Encounter for antineoplastic chemotherapy: Secondary | ICD-10-CM | POA: Diagnosis not present

## 2020-07-25 DIAGNOSIS — Z7901 Long term (current) use of anticoagulants: Secondary | ICD-10-CM | POA: Diagnosis not present

## 2020-07-25 DIAGNOSIS — Z7189 Other specified counseling: Secondary | ICD-10-CM

## 2020-07-25 LAB — CBC WITH DIFFERENTIAL (CANCER CENTER ONLY)
Abs Immature Granulocytes: 0.01 10*3/uL (ref 0.00–0.07)
Basophils Absolute: 0 10*3/uL (ref 0.0–0.1)
Basophils Relative: 1 %
Eosinophils Absolute: 0 10*3/uL (ref 0.0–0.5)
Eosinophils Relative: 1 %
HCT: 28.2 % — ABNORMAL LOW (ref 36.0–46.0)
Hemoglobin: 9.5 g/dL — ABNORMAL LOW (ref 12.0–15.0)
Immature Granulocytes: 0 %
Lymphocytes Relative: 54 %
Lymphs Abs: 1.7 10*3/uL (ref 0.7–4.0)
MCH: 38.3 pg — ABNORMAL HIGH (ref 26.0–34.0)
MCHC: 33.7 g/dL (ref 30.0–36.0)
MCV: 113.7 fL — ABNORMAL HIGH (ref 80.0–100.0)
Monocytes Absolute: 0.4 10*3/uL (ref 0.1–1.0)
Monocytes Relative: 11 %
Neutro Abs: 1 10*3/uL — ABNORMAL LOW (ref 1.7–7.7)
Neutrophils Relative %: 33 %
Platelet Count: 152 10*3/uL (ref 150–400)
RBC: 2.48 MIL/uL — ABNORMAL LOW (ref 3.87–5.11)
RDW: 15.1 % (ref 11.5–15.5)
WBC Count: 3.2 10*3/uL — ABNORMAL LOW (ref 4.0–10.5)
nRBC: 0 % (ref 0.0–0.2)

## 2020-07-25 LAB — CMP (CANCER CENTER ONLY)
ALT: 20 U/L (ref 0–44)
AST: 18 U/L (ref 15–41)
Albumin: 3.6 g/dL (ref 3.5–5.0)
Alkaline Phosphatase: 91 U/L (ref 38–126)
Anion gap: 8 (ref 5–15)
BUN: 14 mg/dL (ref 8–23)
CO2: 27 mmol/L (ref 22–32)
Calcium: 9.7 mg/dL (ref 8.9–10.3)
Chloride: 104 mmol/L (ref 98–111)
Creatinine: 1 mg/dL (ref 0.44–1.00)
GFR, Estimated: 58 mL/min — ABNORMAL LOW (ref >60)
Glucose, Bld: 128 mg/dL — ABNORMAL HIGH (ref 70–99)
Potassium: 3.9 mmol/L (ref 3.5–5.1)
Sodium: 139 mmol/L (ref 135–145)
Total Bilirubin: 0.3 mg/dL (ref 0.3–1.2)
Total Protein: 7 g/dL (ref 6.5–8.1)

## 2020-07-25 MED ORDER — DIPHENHYDRAMINE HCL 50 MG/ML IJ SOLN
INTRAMUSCULAR | Status: AC
  Filled 2020-07-25: qty 1

## 2020-07-25 MED ORDER — SODIUM CHLORIDE 0.9% FLUSH
10.0000 mL | Freq: Once | INTRAVENOUS | Status: AC
  Administered 2020-07-25: 10 mL
  Filled 2020-07-25: qty 10

## 2020-07-25 MED ORDER — PALONOSETRON HCL INJECTION 0.25 MG/5ML
0.2500 mg | Freq: Once | INTRAVENOUS | Status: AC
  Administered 2020-07-25: 0.25 mg via INTRAVENOUS

## 2020-07-25 MED ORDER — DIPHENHYDRAMINE HCL 50 MG/ML IJ SOLN
25.0000 mg | Freq: Once | INTRAMUSCULAR | Status: AC
  Administered 2020-07-25: 25 mg via INTRAVENOUS

## 2020-07-25 MED ORDER — PALONOSETRON HCL INJECTION 0.25 MG/5ML
INTRAVENOUS | Status: AC
  Filled 2020-07-25: qty 5

## 2020-07-25 MED ORDER — SODIUM CHLORIDE 0.9 % IV SOLN
10.0000 mg | Freq: Once | INTRAVENOUS | Status: AC
  Administered 2020-07-25: 10 mg via INTRAVENOUS
  Filled 2020-07-25: qty 10

## 2020-07-25 MED ORDER — FAMOTIDINE IN NACL 20-0.9 MG/50ML-% IV SOLN
20.0000 mg | Freq: Once | INTRAVENOUS | Status: AC
  Administered 2020-07-25: 20 mg via INTRAVENOUS

## 2020-07-25 MED ORDER — FAMOTIDINE IN NACL 20-0.9 MG/50ML-% IV SOLN
INTRAVENOUS | Status: AC
  Filled 2020-07-25: qty 50

## 2020-07-25 MED ORDER — DOXORUBICIN HCL LIPOSOMAL CHEMO INJECTION 2 MG/ML
30.0000 mg/m2 | Freq: Once | INTRAVENOUS | Status: AC
  Administered 2020-07-25: 64 mg via INTRAVENOUS
  Filled 2020-07-25: qty 25

## 2020-07-25 MED ORDER — DEXTROSE 5 % IV SOLN
Freq: Once | INTRAVENOUS | Status: AC
  Filled 2020-07-25: qty 250

## 2020-07-25 MED ORDER — SODIUM CHLORIDE 0.9 % IV SOLN
150.0000 mg | Freq: Once | INTRAVENOUS | Status: AC
  Administered 2020-07-25: 150 mg via INTRAVENOUS
  Filled 2020-07-25: qty 150

## 2020-07-25 MED ORDER — HEPARIN SOD (PORK) LOCK FLUSH 100 UNIT/ML IV SOLN
500.0000 [IU] | Freq: Once | INTRAVENOUS | Status: AC
  Administered 2020-07-25: 500 [IU]
  Filled 2020-07-25: qty 5

## 2020-07-25 NOTE — Patient Instructions (Signed)
[facility] Discharge Instructions for Patients Receiving Chemotherapy  Today you received the following chemotherapy agent: Doxorubicin Liposomal  To help prevent nausea and vomiting after your treatment, we encourage you to take your nausea medication as directed by your MD.   If you develop nausea and vomiting that is not controlled by your nausea medication, call the clinic.   BELOW ARE SYMPTOMS THAT SHOULD BE REPORTED IMMEDIATELY:  *FEVER GREATER THAN 100.5 F  *CHILLS WITH OR WITHOUT FEVER  NAUSEA AND VOMITING THAT IS NOT CONTROLLED WITH YOUR NAUSEA MEDICATION  *UNUSUAL SHORTNESS OF BREATH  *UNUSUAL BRUISING OR BLEEDING  TENDERNESS IN MOUTH AND THROAT WITH OR WITHOUT PRESENCE OF ULCERS  *URINARY PROBLEMS  *BOWEL PROBLEMS  UNUSUAL RASH Items with * indicate a potential emergency and should be followed up as soon as possible.  Feel free to call the clinic should you have any questions or concerns. The clinic phone number is [phone].  Please show the [facility] at check-in to the Emergency Department and triage nurse.

## 2020-07-25 NOTE — Assessment & Plan Note (Addendum)
Unfortunately, she developed recent PE She is profoundly fatigue from recent treatment We discussed the risk and benefits of continuing treatment and she is somewhat reluctant to stop treatment I recommend with holding carboplatin this cycle to allow more recovery If her pancytopenia improved, we can continue single agent Doxil only in the future She is in agreement to proceed with carboplatin this cycle

## 2020-07-25 NOTE — Progress Notes (Signed)
Per Dr. Alvy Bimler, ok for treatment today with ANC 1.0.

## 2020-07-25 NOTE — Progress Notes (Signed)
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[facility]) Unfortunately, she developed recent PE She is profoundly fatigue from recent treatment We discussed the risk and benefits of continuing treatment and she is somewhat reluctant to stop treatment I recommend with holding carboplatin this cycle to allow more recovery If her pancytopenia improved, we can continue single agent Doxil only in the future She is in agreement to proceed with carboplatin this cycle  Pancytopenia, acquired ([place]) She has profound pancytopenia due to recent treatment Her counts are improving We have ordered additional nutritional studies such as B12, folate and iron studies and they were unremarkable I plan to hold carboplatin this cycle and reassess She does not need transfusion support for now  Pulmonary embolism [facility]) She has recurrent PE despite improved disease control on recent imaging She will continue anticoagulation indefinitely   No orders of the defined types were placed in this encounter.   All questions were answered. The patient knows to call the clinic with any problems, questions or concerns. The total time spent in the appointment was 40 minutes encounter with patients including review of chart and various tests results, discussions about plan of care and coordination of care plan   [name], MD 07/25/2020 3:12 PM  INTERVAL HISTORY: Please see below for problem oriented charting. She returns for cycle 5 of chemotherapy Since last time I saw her, she was briefly admitted to local hospital with pulmonary emboli She was anticoagulated with Eliquis Today, she felt bad She has extreme fatigue The patient denies any recent signs or symptoms of bleeding such as spontaneous epistaxis, hematuria or  hematochezia. 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  She has bloating and early satiety. Shortness of breath with walking. She denied bleeding. She reported constipation with pain with defecation and narrowed stools   11/29/2018 Imaging   1. 13 cm complex cystic lesion in the central pelvis, highly suspicious for ovarian cystadenocarcinoma. 2. Diffuse peritoneal carcinomatosis with mild ascites. 3. Mild lymphadenopathy in porta hepatis and right cardiophrenic angle, suspicious for metastatic disease. 4. Moderate right and tiny left pleural effusions   12/05/2018 Tumor Marker   Patient's tumor was tested for the following markers: CA-125 Results of the tumor marker test revealed 1015.   12/06/2018 Cancer Staging   Staging form: Ovary, Fallopian Tube, and Primary Peritoneal Carcinoma, AJCC 8th Edition - Clinical: FIGO Stage IVA, calculated as Stage IV (cT3c, cN1, cM1) - Signed by [name], MD on 12/06/2018   12/09/2018 Pathology Results   PLEURAL FLUID, RIGHT (SPECIMEN 1 OF 1 COLLECTED 12/09/18): - MALIGNANT CELLS CONSISTENT WITH METASTATIC ADENOCARCINOMA - SEE COMMENT Comment The neoplastic cells are positive for cytokeratin 7 and Pax-8 but negative for cytokeratin 20, TTF-1, CDX-2 and Gata-3. Overall, the phenotype is consistent with the clinical impression of gynecologic primary.    12/15/2018 Procedure   Placement of single lumen port a cath via right internal jugular vein. The catheter tip lies at the cavo-atrial junction. A power injectable port a cath was placed and is ready for immediate use  12/16/2018 - 08/24/2019 Chemotherapy   The patient had carboplatin and taxol for chemotherapy treatment.  She had 7 cycles  given neoadjuvant prior to surgery and 3 more cycles after surgery, for a total of 10 cycles of treatment    01/06/2019 Tumor Marker   Patient's tumor was tested for the following markers: CA-125 Results of the tumor marker test revealed 948   02/02/2019 Imaging   1. Massive pulmonary embolism, as discussed above. Given the mildly elevated RV to LV ratio of 0.95, this is associated with increased risk of morbidity and mortality. 2. Today's study demonstrates a mixed response to therapy. Specifically, while there has been regression of the bulky intraperitoneal metastatic disease and regression of previously noted pleural effusions, the large cystic mass in the central pelvis has increased in size compared to the prior study. 3. New right mild hydroureteronephrosis related to extrinsic compression on the distal third of the right ureter by the patient's large pelvic mass. 4. Scattered small pulmonary nodules (predominantly pleural based) appear stable compared to prior examinations. These are nonspecific but warrant continued attention on follow-up studies. 5. Aortic atherosclerosis, in addition to three-vessel coronary artery disease. Assessment for potential risk factor modification, dietary therapy or pharmacologic therapy may be warranted, if clinically indicated.   02/02/2019 - 02/04/2019 Hospital Admission   She was admitted the hospital due to DVT and PE   02/03/2019 Imaging   Bilateral venous Doppler US Right: Findings consistent with acute deep vein thrombosis involving the right femoral vein, right popliteal vein, right peroneal veins, right soleal veins, and right gastrocnemius veins. No cystic structure found in the popliteal fossa. Left: There is no evidence of deep vein thrombosis in the lower extremity. No cystic structure found in the popliteal fossa   02/17/2019 Tumor Marker   Patient's tumor was tested for the following markers: CA-125 Results of the tumor marker test revealed 127    03/10/2019 Tumor Marker   Patient's tumor was tested for the following markers: CA-125. Results of the tumor marker test revealed 87.4   03/14/2019 - 03/16/2019 Hospital Admission   She was admitted to the hospital recently for weakness   03/31/2019 Tumor Marker   Patient's tumor was tested for the following markers: CA-125 Results of the tumor marker test revealed 59.2.   04/28/2019 Tumor Marker   Patient's tumor was tested for the following markers: CA-125 Results of the tumor marker test revealed 56.8   05/05/2019 Imaging   1. Interval decrease in size of the large cystic mass in the central pelvis. Mesenteric and omental soft tissue disease shows no substantial interval change. 2. The mild right hydroureteronephrosis seen previously has resolved in the interval. 3. Small residual nonobstructive thrombus identified in the inter lobar pulmonary artery common here into the lateral wall compatible with chronicity. 4. 14 mm subtle enhancing lesion in the anterior right liver is stable. This may be vascular malformation. Attention on follow-up recommended. 5. Stable appearance of the multiple small bilateral pulmonary nodules. Continued attention on follow-up recommended. 6.  Aortic Atherosclerois (ICD10-170.0)     06/06/2019 Pathology Results   A. OMENTUM, RESECTION: - Metastatic carcinoma. B. RIGHT FALLOPIAN TUBE AND OVARY, SALPINGOOOPHORECTOMY: - High-grade serous carcinoma, spanning 9 cm. - No surface involvement identified. - Fallopian tube involved by carcinoma. - See oncology table. C. PERITONEAL NODULE, EXCISION: - Metastatic carcinoma. ONCOLOGY TABLE: OVARY or FALLOPIAN TUBE or PRIMARY PERITONEUM: Procedure: Right salpingo-oophorectomy, omental resection, and peritoneal biopsy. Specimen Integrity: Intact Tumor Site: Right ovary Ovarian Surface Involvement (  required only if applicable): Not identified Tumor Size: 9 cm Histologic Type: High-grade serous carcinoma Histologic  Grade: High-grade Implants (required for advanced stage serous/seromucinous borderline tumors only): Omentum, peritoneum. Other Tissue/ Organ Involvement: Right fallopian tube Largest Extrapelvic Peritoneal Focus (required only if applicable): 7.3 cm Peritoneal/Ascitic Fluid: Positive pleural fluid pre neoadjuvant therapy. Treatment Effect (required only for high-grade serous carcinomas): Probable treatment effect present. Regional Lymph Nodes: No lymph nodes submitted or found Pathologic Stage Classification (pTNM, AJCC 8th Edition): ypT3c, ypNX Representative Tumor Block: B2   06/06/2019 Surgery   Preoperative Diagnosis: stage IV ovarian cancer, s/p neoadjuvant chemotherapy, history of recent PE.  Procedure(s) Performed: Exploratory laparotomy with right salpingo-oophorectomy, omentectomy radical tumor debulking for ovarian cancer .   Surgeon: Thereasa Solo, MD.    Operative Findings:  Omental cake adherent to anterior abdominal wall and hepatic flexure. 10cm right tube and ovary. Surgically absent uterus and left tube and ovary. Granular nodularity across right diaphragm.    This represented an optimal cytoreduction (R0) with no gross visible disease remaining.    07/06/2019 Tumor Marker   Patient's tumor was tested for the following markers:CA-125 Results of the tumor marker test revealed 16.4   08/10/2019 Genetic Testing   Negative genetic testing. No pathogenic variants identified. VUS in BRCA2 called [id number] identified on the [employer] CancerNext+RNAinsight panel. The report date is 08/10/2019.  The CancerNext+RNAinsight gene panel offered by [employer] includes sequencing and rearrangement analysis for the following 36 genes: APC*, ATM*, AXIN2, BARD1, BMPR1A, BRCA1*, BRCA2*, BRIP1*, CDH1*, CDK4, CDKN2A, CHEK2*, DICER1, MLH1*, MSH2*, MSH3, MSH6*, MUTYH*, NBN, NF1*, NTHL1, PALB2*, PMS2*, PTEN*, RAD51C*, RAD51D*, RECQL, SMAD4, SMARCA4, STK11 and TP53* (sequencing and  deletion/duplication); HOXB13, POLD1 and POLE (sequencing only); EPCAM and GREM1 (deletion/duplication only). DNA and RNA analyses performed for * genes.   HRD (somatic) testing was initially ordered and was not completed due to insufficient amount of tumor sample.    08/24/2019 Tumor Marker   Patient's tumor was tested for the following markers: CA-125 Results of the tumor marker test revealed 12.2   09/20/2019 Imaging   1. Interval debulking, bilateral salpingo oophorectomy and omentectomy with resection of the dominant pelvic cystic lesion and the bandlike soft tissue seen previously in the right mesentery/omentum. Today's study is status shows new postoperative baseline for follow-up. 2. 6 mm gastrohepatic ligament nodule seen on the previous study is stable. Continued attention on follow-up recommended. 3. No new or progressive findings in the chest, abdomen, or pelvis to suggest disease progression. 4. Stable 1.4 cm focus of homogeneous enhancement in the anterior right liver. Stable since at least 11/29/2018 and likely benign. Continued attention on follow-up recommended. 5. Aortic Atherosclerosis (ICD10-I70.0).   09/20/2019 Tumor Marker   Patient's tumor was tested for the following markers: CA-125 Results of the tumor marker test revealed 11.9   12/26/2019 Tumor Marker   Patient's tumor was tested for the following markers: CA-125 Results of the tumor marker test revealed 16.5   03/25/2020 Imaging   1. Interval progression of peritoneal disease which predominantly involves the serosal surface of the proximal and distal transverse colon, and descending colon. 2. New bilateral inguinal adenopathy. 3. No ascites. 4. Aortic atherosclerosis.   03/25/2020 Tumor Marker   Patient's tumor was tested for the following markers: CA-125 Results of the tumor marker test revealed 249   03/28/2020 Echocardiogram    1. Borderline LV Strain; A2C view represents the most accurate acquisition.. Left  ventricular ejection fraction, by estimation, is 60 to 65%. Left  ventricular ejection fraction by PLAX is 63 %. The left ventricle has normal function. The left ventricle demonstrates regional wall motion abnormalities (see scoring diagram/findings for description). Left ventricular diastolic parameters were normal.  2. Right ventricular systolic function is normal. The right ventricular size is normal.  3. Left atrial size was mildly dilated.  4. The mitral valve is normal in structure. No evidence of mitral valve regurgitation.  5. The aortic valve is grossly normal. Aortic valve regurgitation is mild.  6. The inferior vena cava is normal in size with greater than 50% respiratory variability, suggesting right atrial pressure of 3 mmHg.   04/02/2020 Tumor Marker   Patient's tumor was tested for the following markers: CA-125 Results of the tumor marker test revealed 304   04/02/2020 -  Chemotherapy   The patient had carboplatin and Doxil for chemotherapy treatment.     05/27/2020 Tumor Marker   Patient's tumor was tested for the following markers: CA-125. Results of the tumor marker test revealed 42.7   06/10/2020 Imaging   Improving peritoneal disease and serosal implants along the left colon, as above.   Improving bilateral inguinal nodes.   06/18/2020 Echocardiogram    1. Left ventricular ejection fraction, by estimation, is 60 to 65%. The left ventricle has normal function. The left ventricle has no regional wall motion abnormalities. There is mild left ventricular hypertrophy of the basal-septal segment. Left ventricular diastolic parameters were normal. The average left ventricular global longitudinal strain is -25.0 %. The global longitudinal strain is normal.  2. Right ventricular systolic function is normal. The right ventricular size is normal.  3. The mitral valve is grossly normal. Mild mitral valve regurgitation.  4. Tricuspid valve regurgitation is moderate.  5. The aortic  valve is tricuspid. There is mild calcification of the aortic valve. There is mild thickening of the aortic valve. Aortic valve regurgitation is mild to moderate.  6. The inferior vena cava is normal in size with <50% respiratory variability, suggesting right atrial pressure of 8 mmHg.   06/25/2020 Tumor Marker   Patient's tumor was tested for the following markers: CA-125 Results of the tumor marker test revealed 23.9   07/15/2020 Imaging   IMPRESSION: 1. Mild amount of acute pulmonary embolism within multiple middle lobe and lower lobe branches of the right pulmonary artery. 2.   Small amount of chronic right pulmonary embolism. 3.   Mild posterior left basilar atelectasis and/or infiltrate. 4.   Small left pleural effusion. 5. Aortic atherosclerosis.   Aortic Atherosclerosis (ICD10-I70.0).     07/15/2020 - 07/17/2020 Hospital Admission   She was admitted to a local hospital after presentation with chest pain and shortness of breath and was found to have significant pulmonary embolism She was anticoagulated and discharged   07/16/2020 Echocardiogram   1. Left ventricular ejection fraction, by estimation, is 60 to 65%. The left ventricle has normal function. The left ventricle has no regional wall motion abnormalities. There is mild left ventricular hypertrophy.  Left ventricular diastolic parameters are indeterminate.   2. Right ventricular systolic function is normal. The right ventricular size is normal. There is normal pulmonary artery systolic pressure. The estimated right ventricular systolic pressure is 63.7 mmHg.   3. The mitral valve is grossly normal. Trivial mitral valve regurgitation.   4. The aortic valve is tricuspid. Aortic valve regurgitation is mild.   5. The inferior vena cava is normal in size with greater than 50% respiratory variability, suggesting right atrial pressure of 3 mmHg.  REVIEW OF SYSTEMS:   Constitutional: Denies fevers, chills or abnormal weight  loss Eyes: Denies blurriness of vision Ears, nose, mouth, throat, and face: Denies mucositis or sore throat Respiratory: Denies cough, dyspnea or wheezes Cardiovascular: Denies palpitation, chest discomfort or lower extremity swelling Gastrointestinal:  Denies nausea, heartburn or change in bowel habits Skin: Denies abnormal skin rashes Lymphatics: Denies new lymphadenopathy or easy bruising Neurological:Denies numbness, tingling or new weaknesses Behavioral/Psych: Mood is stable, no new changes  All other systems were reviewed with the patient and are negative.  I have reviewed the past medical history, past surgical history, social history and family history with the patient and they are unchanged from previous note.  ALLERGIES:  has No Known Allergies.  MEDICATIONS:  Current Outpatient Medications  Medication Sig Dispense Refill  . cholecalciferol (VITAMIN D3) 25 MCG (1000 UNIT) tablet Take 1,000 Units by mouth daily.    Marland Kitchen loratadine (CLARITIN) 10 MG tablet Take 10 mg by mouth daily.    Marland Kitchen acetaminophen (TYLENOL) 325 MG tablet Take 650 mg by mouth every 6 (six) hours as needed for moderate pain or headache.     Derrill Memo ON 08/14/2020] apixaban (ELIQUIS) 5 MG TABS tablet Take 1 tablet (5 mg total) by mouth 2 (two) times daily. --Start around 08/14/20 after you complete your initial starter pack 60 tablet 5  . Apixaban Starter Pack, 28m and 516m (ELIQUIS DVT/PE STARTER PACK) Take as directed on package: start with two-7m7mablets twice daily for 7 days. On day 8, switch to one-7mg71mblet twice daily. 1 each 0  . levothyroxine (SYNTHROID) 75 MCG tablet Take 1 tablet (75 mcg total) by mouth daily before breakfast. 30 tablet 3  . lidocaine-prilocaine (EMLA) cream Apply to affected area once (Patient taking differently: Apply 1 application topically daily as needed (port access).) 30 g 3  . Melatonin 3 MG TABS Take by mouth.    . ondansetron (ZOFRAN) 8 MG tablet Take 8 mg by mouth every 8  (eight) hours as needed.    . pantoprazole (PROTONIX) 40 MG tablet Take 1 tablet (40 mg total) by mouth daily. 30 tablet 4  . prochlorperazine (COMPAZINE) 10 MG tablet Take 10 mg by mouth every 6 (six) hours as needed.    . senna-docusate (SENOKOT-S) 8.6-50 MG tablet Take 2 tablets by mouth at bedtime. 60 tablet 3  . vitamin B-12 1000 MCG tablet Take 1 tablet (1,000 mcg total) by mouth daily. 30 tablet 3   No current facility-administered medications for this visit.   Facility-Administered Medications Ordered in Other Visits  Medication Dose Route Frequency Provider Last Rate Last Admin  . DOXOrubicin HCL LIPOSOMAL (DOXIL) 64 mg in dextrose 5 % 250 mL chemo infusion  30 mg/m2 (Treatment Plan Recorded) Intravenous Once GorsAlvy Bimler, MD        PHYSICAL EXAMINATION: ECOG PERFORMANCE STATUS: 2 - Symptomatic, <50% confined to bed  Vitals:   07/25/20 1220  BP: 129/73  Pulse: 76  Resp: 18  Temp: (!) 97.1 F (36.2 C)  SpO2: 100%   Filed Weights   07/25/20 1220  Weight: 220 lb 9.6 oz (100.1 kg)    GENERAL:alert, no distress and comfortable SKIN: skin color, texture, turgor are normal, no rashes or significant lesions EYES: normal, Conjunctiva are pink and non-injected, sclera clear OROPHARYNX:no exudate, no erythema and lips, buccal mucosa, and tongue normal  NECK: supple, thyroid normal size, non-tender, without nodularity LYMPH:  no palpable lymphadenopathy in the cervical, axillary or inguinal LUNGS: clear to auscultation  and percussion with normal breathing effort HEART: regular rate & rhythm and no murmurs and no lower extremity edema ABDOMEN:abdomen soft, non-tender and normal bowel sounds Musculoskeletal:no cyanosis of digits and no clubbing  NEURO: alert & oriented x 3 with fluent speech, no focal motor/sensory deficits  LABORATORY DATA:  I have reviewed the data as listed    Component Value Date/Time   NA 139 07/25/2020 1208   K 3.9 07/25/2020 1208   CL 104 07/25/2020  1208   CO2 27 07/25/2020 1208   GLUCOSE 128 (H) 07/25/2020 1208   BUN 14 07/25/2020 1208   CREATININE 1.00 07/25/2020 1208   CALCIUM 9.7 07/25/2020 1208   PROT 7.0 07/25/2020 1208   ALBUMIN 3.6 07/25/2020 1208   AST 18 07/25/2020 1208   ALT 20 07/25/2020 1208   ALKPHOS 91 07/25/2020 1208   BILITOT 0.3 07/25/2020 1208   GFRNONAA 58 (L) 07/25/2020 1208   GFRAA >60 03/25/2020 1144    No results found for: SPEP, UPEP  Lab Results  Component Value Date   WBC 3.2 (L) 07/25/2020   NEUTROABS 1.0 (L) 07/25/2020   HGB 9.5 (L) 07/25/2020   HCT 28.2 (L) 07/25/2020   MCV 113.7 (H) 07/25/2020   PLT 152 07/25/2020      Chemistry      Component Value Date/Time   NA 139 07/25/2020 1208   K 3.9 07/25/2020 1208   CL 104 07/25/2020 1208   CO2 27 07/25/2020 1208   BUN 14 07/25/2020 1208   CREATININE 1.00 07/25/2020 1208      Component Value Date/Time   CALCIUM 9.7 07/25/2020 1208   ALKPHOS 91 07/25/2020 1208   AST 18 07/25/2020 1208   ALT 20 07/25/2020 1208   BILITOT 0.3 07/25/2020 1208       RADIOGRAPHIC STUDIES: I have personally reviewed the radiological images as listed and agreed with the findings in the report. CT Angio Chest PE W and/or Wo Contrast  Result Date: 07/15/2020 CLINICAL DATA:  Hervey Ard left-sided chest pain. EXAM: CT ANGIOGRAPHY CHEST WITH CONTRAST TECHNIQUE: Multidetector CT imaging of the chest was performed using the standard protocol during bolus administration of intravenous contrast. Multiplanar CT image reconstructions and MIPs were obtained to evaluate the vascular anatomy. CONTRAST:  151m OMNIPAQUE IOHEXOL 350 MG/ML SOLN COMPARISON:  September 20, 2019 FINDINGS: Cardiovascular: A right-sided venous Port-A-Cath is in place. Very mild calcification of the aortic arch is seen. Mild areas of intraluminal low attenuation are seen within multiple middle lobe and lower lobe branches of the right pulmonary artery (axial CT images 55 through 77, CT series number 4). A very  small curvilinear area of intraluminal low attenuation is also seen within a distal segmental branch of the right pulmonary artery. A small amount of pulmonary embolism is seen within this region on the prior study. No saddle embolus is identified. Normal heart size without evidence of heart strain. No pericardial effusion. Mediastinum/Nodes: No enlarged mediastinal, hilar, or axillary lymph nodes. Thyroid gland, trachea, and esophagus demonstrate no significant findings. Lungs/Pleura: Mild biapical scarring and/or atelectasis is seen. Mild posterior left basilar atelectasis and/or infiltrate is also noted. There is a small left pleural effusion. No pneumothorax is identified. Upper Abdomen: No acute abnormality. Musculoskeletal: No chest wall abnormality. No acute or significant osseous findings. Review of the MIP images confirms the above findings. IMPRESSION: 1. Mild amount of acute pulmonary embolism within multiple middle lobe and lower lobe branches of the right pulmonary artery. 2.   Small amount of chronic  right pulmonary embolism. 3.   Mild posterior left basilar atelectasis and/or infiltrate. 4.   Small left pleural effusion. 5. Aortic atherosclerosis. Aortic Atherosclerosis (ICD10-I70.0). Electronically Signed   By: Virgina Norfolk M.D.   On: 07/15/2020 18:57   US Venous Img Lower Bilateral (DVT)  Result Date: 07/16/2020 CLINICAL DATA:  Pulmonary embolism discovered on CT in this 80 year old female EXAM: Bilateral LOWER EXTREMITY VENOUS DOPPLER ULTRASOUND TECHNIQUE: Gray-scale sonography with compression, as well as color and duplex ultrasound, were performed to evaluate the deep venous system(s) from the level of the common femoral vein through the popliteal and proximal calf veins. COMPARISON:  CT of the chest of July 15, 2020 FINDINGS: VENOUS Normal compressibility of the common femoral, superficial femoral, and popliteal veins, as well as the visualized calf veins. Visualized portions of  profunda femoral vein and great saphenous vein unremarkable. No filling defects to suggest DVT on grayscale or color Doppler imaging. Doppler waveforms show normal direction of venous flow, normal respiratory plasticity and response to augmentation. OTHER None. Limitations: none IMPRESSION: Negative for lower extremity deep venous thrombosis in the bilateral lower extremities. Electronically Signed   By: Zetta Bills M.D.   On: 07/16/2020 09:50   ECHOCARDIOGRAM COMPLETE  Result Date: 07/16/2020    ECHOCARDIOGRAM REPORT   Patient Name:   ANGELIA HAZELL Mishkin Date of Exam: 07/16/2020 Medical Rec #:  [id number]    Height:       67.0 in Accession #:    [id number]   Weight:       213.0 lb Date of Birth:  [birth year]   BSA:          2.077 m Patient Age:    80 years     BP:           115/57 mmHg Patient Gender: F            HR:           73 bpm. Exam Location:  [facility] Procedure: 2D Echo, Cardiac Doppler and Color Doppler Indications:    Pulmonary Embolus 415.19 / I26.99  History:        Patient has prior history of Echocardiogram examinations, most                 recent 06/18/2020. Stroke; Risk Factors:Hypertension. DVT (deep                 venous thrombosis) (HCC) (From Hx), History of pulmonary                 embolus, Encounter for antineoplastic chemotheraphy, Right                 ovarian epithelial cancer.  Sonographer:    Alvino Chapel RCS Referring Phys: [id number] ASIA B [place]  1. Left ventricular ejection fraction, by estimation, is 60 to 65%. The left ventricle has normal function. The left ventricle has no regional wall motion abnormalities. There is mild left ventricular hypertrophy. Left ventricular diastolic parameters are indeterminate.  2. Right ventricular systolic function is normal. The right ventricular size is normal. There is normal pulmonary artery systolic pressure. The estimated right ventricular systolic pressure is 10.2 mmHg.  3. The mitral valve is grossly normal. Trivial  mitral valve regurgitation.  4. The aortic valve is tricuspid. Aortic valve regurgitation is mild.  5. The inferior vena cava is normal in size with greater than 50% respiratory variability, suggesting right atrial pressure of 3 mmHg. FINDINGS  Left Ventricle: Left  ventricular ejection fraction, by estimation, is 60 to 65%. The left ventricle has normal function. The left ventricle has no regional wall motion abnormalities. The left ventricular internal cavity size was normal in size. There is  mild left ventricular hypertrophy. Left ventricular diastolic parameters are indeterminate. Right Ventricle: The right ventricular size is normal. No increase in right ventricular wall thickness. Right ventricular systolic function is normal. There is normal pulmonary artery systolic pressure. The tricuspid regurgitant velocity is 1.86 m/s, and  with an assumed right atrial pressure of 3 mmHg, the estimated right ventricular systolic pressure is 38.8 mmHg. Left Atrium: Left atrial size was normal in size. Right Atrium: Right atrial size was normal in size. Pericardium: There is no evidence of pericardial effusion. Presence of pericardial fat pad. Mitral Valve: The mitral valve is grossly normal. Trivial mitral valve regurgitation. Tricuspid Valve: The tricuspid valve is grossly normal. Tricuspid valve regurgitation is trivial. Aortic Valve: The aortic valve is tricuspid. There is mild aortic valve annular calcification. Aortic valve regurgitation is mild. Aortic regurgitation PHT measures 496 msec. Pulmonic Valve: The pulmonic valve was grossly normal. Pulmonic valve regurgitation is trivial. Aorta: The aortic root is normal in size and structure. Venous: The inferior vena cava is normal in size with greater than 50% respiratory variability, suggesting right atrial pressure of 3 mmHg. IAS/Shunts: No atrial level shunt detected by color flow Doppler.  LEFT VENTRICLE PLAX 2D LVIDd:         4.60 cm  Diastology LVIDs:          2.40 cm  LV e' medial:    6.20 cm/s LV PW:         1.10 cm  LV E/e' medial:  10.5 LV IVS:        1.00 cm  LV e' lateral:   9.90 cm/s LVOT diam:     2.00 cm  LV E/e' lateral: 6.6 LV SV:         78 LV SV Index:   37 LVOT Area:     3.14 cm  RIGHT VENTRICLE RV S prime:     11.00 cm/s TAPSE (M-mode): 2.1 cm LEFT ATRIUM             Index       RIGHT ATRIUM           Index LA diam:        3.30 cm 1.59 cm/m  RA Area:     18.40 cm LA Vol (A2C):   43.9 ml 21.14 ml/m RA Volume:   46.50 ml  22.39 ml/m LA Vol (A4C):   64.7 ml 31.15 ml/m LA Biplane Vol: 54.8 ml 26.38 ml/m  AORTIC VALVE LVOT Vmax:   111.00 cm/s LVOT Vmean:  77.400 cm/s LVOT VTI:    0.247 m AI PHT:      496 msec  AORTA Ao Root diam: 3.20 cm MITRAL VALVE               TRICUSPID VALVE MV Area (PHT): 3.21 cm    TR Peak grad:   13.8 mmHg MV Decel Time: 236 msec    TR Vmax:        186.00 cm/s MV E velocity: 65.20 cm/s MV A velocity: 80.60 cm/s  SHUNTS MV E/A ratio:  0.81        Systemic VTI:  0.25 m  Systemic Diam: 2.00 cm Rozann Lesches MD Electronically signed by Rozann Lesches MD Signature Date/Time: 07/16/2020/10:26:07 AM    Final

## 2020-07-25 NOTE — Assessment & Plan Note (Signed)
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

## 2020-07-25 NOTE — Assessment & Plan Note (Signed)
She has profound pancytopenia due to recent treatment Her counts are improving We have ordered additional nutritional studies such as B12, folate and iron studies and they were unremarkable I plan to hold carboplatin this cycle and reassess She does not need transfusion support for now

## 2020-07-26 LAB — CA 125: Cancer Antigen (CA) 125: 36.4 U/mL (ref 0.0–38.1)

## 2020-07-29 ENCOUNTER — Telehealth: Payer: Self-pay

## 2020-07-29 NOTE — Telephone Encounter (Signed)
Caryl Pina with [facility] from [phone] called. Requested recent labs faxed to [phone]. Received confirmation.

## 2020-08-01 DIAGNOSIS — Z1389 Encounter for screening for other disorder: Secondary | ICD-10-CM | POA: Diagnosis not present

## 2020-08-01 DIAGNOSIS — C800 Disseminated malignant neoplasm, unspecified: Secondary | ICD-10-CM | POA: Diagnosis not present

## 2020-08-01 DIAGNOSIS — L651 Anagen effluvium: Secondary | ICD-10-CM | POA: Diagnosis not present

## 2020-08-01 DIAGNOSIS — G459 Transient cerebral ischemic attack, unspecified: Secondary | ICD-10-CM | POA: Diagnosis not present

## 2020-08-01 DIAGNOSIS — I10 Essential (primary) hypertension: Secondary | ICD-10-CM | POA: Diagnosis not present

## 2020-08-01 DIAGNOSIS — E7849 Other hyperlipidemia: Secondary | ICD-10-CM | POA: Diagnosis not present

## 2020-08-12 DIAGNOSIS — J329 Chronic sinusitis, unspecified: Secondary | ICD-10-CM | POA: Diagnosis not present

## 2020-08-22 ENCOUNTER — Telehealth: Payer: Self-pay | Admitting: Hematology and Oncology

## 2020-08-22 ENCOUNTER — Other Ambulatory Visit: Payer: Self-pay

## 2020-08-22 ENCOUNTER — Inpatient Hospital Stay: Payer: Medicare Other

## 2020-08-22 ENCOUNTER — Other Ambulatory Visit: Payer: Self-pay | Admitting: Hematology and Oncology

## 2020-08-22 ENCOUNTER — Inpatient Hospital Stay: Payer: Medicare Other | Attending: Gynecologic Oncology | Admitting: Hematology and Oncology

## 2020-08-22 ENCOUNTER — Encounter: Payer: Self-pay | Admitting: Hematology and Oncology

## 2020-08-22 VITALS — BP 144/65 | HR 84 | Temp 97.5°F | Resp 20 | Wt 215.8 lb

## 2020-08-22 DIAGNOSIS — C561 Malignant neoplasm of right ovary: Secondary | ICD-10-CM

## 2020-08-22 DIAGNOSIS — Z7189 Other specified counseling: Secondary | ICD-10-CM

## 2020-08-22 DIAGNOSIS — Z5111 Encounter for antineoplastic chemotherapy: Secondary | ICD-10-CM | POA: Diagnosis not present

## 2020-08-22 DIAGNOSIS — D61818 Other pancytopenia: Secondary | ICD-10-CM

## 2020-08-22 DIAGNOSIS — Z7901 Long term (current) use of anticoagulants: Secondary | ICD-10-CM | POA: Insufficient documentation

## 2020-08-22 DIAGNOSIS — I2699 Other pulmonary embolism without acute cor pulmonale: Secondary | ICD-10-CM | POA: Diagnosis not present

## 2020-08-22 LAB — CMP (CANCER CENTER ONLY)
ALT: 17 U/L (ref 0–44)
AST: 19 U/L (ref 15–41)
Albumin: 3.8 g/dL (ref 3.5–5.0)
Alkaline Phosphatase: 88 U/L (ref 38–126)
Anion gap: 10 (ref 5–15)
BUN: 18 mg/dL (ref 8–23)
CO2: 23 mmol/L (ref 22–32)
Calcium: 9.9 mg/dL (ref 8.9–10.3)
Chloride: 104 mmol/L (ref 98–111)
Creatinine: 1.2 mg/dL — ABNORMAL HIGH (ref 0.44–1.00)
GFR, Estimated: 47 mL/min — ABNORMAL LOW (ref >60)
Glucose, Bld: 115 mg/dL — ABNORMAL HIGH (ref 70–99)
Potassium: 4 mmol/L (ref 3.5–5.1)
Sodium: 137 mmol/L (ref 135–145)
Total Bilirubin: 0.3 mg/dL (ref 0.3–1.2)
Total Protein: 7.3 g/dL (ref 6.5–8.1)

## 2020-08-22 LAB — CBC WITH DIFFERENTIAL (CANCER CENTER ONLY)
Abs Immature Granulocytes: 0.01 10*3/uL (ref 0.00–0.07)
Basophils Absolute: 0 10*3/uL (ref 0.0–0.1)
Basophils Relative: 1 %
Eosinophils Absolute: 0 10*3/uL (ref 0.0–0.5)
Eosinophils Relative: 0 %
HCT: 34.3 % — ABNORMAL LOW (ref 36.0–46.0)
Hemoglobin: 11.5 g/dL — ABNORMAL LOW (ref 12.0–15.0)
Immature Granulocytes: 0 %
Lymphocytes Relative: 39 %
Lymphs Abs: 1.5 10*3/uL (ref 0.7–4.0)
MCH: 37.2 pg — ABNORMAL HIGH (ref 26.0–34.0)
MCHC: 33.5 g/dL (ref 30.0–36.0)
MCV: 111 fL — ABNORMAL HIGH (ref 80.0–100.0)
Monocytes Absolute: 0.7 10*3/uL (ref 0.1–1.0)
Monocytes Relative: 18 %
Neutro Abs: 1.6 10*3/uL — ABNORMAL LOW (ref 1.7–7.7)
Neutrophils Relative %: 42 %
Platelet Count: 261 10*3/uL (ref 150–400)
RBC: 3.09 MIL/uL — ABNORMAL LOW (ref 3.87–5.11)
RDW: 13.6 % (ref 11.5–15.5)
WBC Count: 3.9 10*3/uL — ABNORMAL LOW (ref 4.0–10.5)
nRBC: 0 % (ref 0.0–0.2)

## 2020-08-22 MED ORDER — SODIUM CHLORIDE 0.9 % IV SOLN
150.0000 mg | Freq: Once | INTRAVENOUS | Status: AC
  Administered 2020-08-22: 150 mg via INTRAVENOUS
  Filled 2020-08-22: qty 150

## 2020-08-22 MED ORDER — SODIUM CHLORIDE 0.9 % IV SOLN
10.0000 mg | Freq: Once | INTRAVENOUS | Status: AC
  Administered 2020-08-22: 10 mg via INTRAVENOUS
  Filled 2020-08-22: qty 10

## 2020-08-22 MED ORDER — DIPHENHYDRAMINE HCL 50 MG/ML IJ SOLN
INTRAMUSCULAR | Status: AC
  Filled 2020-08-22: qty 1

## 2020-08-22 MED ORDER — DOXORUBICIN HCL LIPOSOMAL CHEMO INJECTION 2 MG/ML
28.0000 mg/m2 | Freq: Once | INTRAVENOUS | Status: AC
  Administered 2020-08-22: 60 mg via INTRAVENOUS
  Filled 2020-08-22: qty 30

## 2020-08-22 MED ORDER — PALONOSETRON HCL INJECTION 0.25 MG/5ML
INTRAVENOUS | Status: AC
  Filled 2020-08-22: qty 5

## 2020-08-22 MED ORDER — SODIUM CHLORIDE 0.9% FLUSH
10.0000 mL | INTRAVENOUS | Status: DC | PRN
  Administered 2020-08-22: 10 mL
  Filled 2020-08-22: qty 10

## 2020-08-22 MED ORDER — DEXTROSE 5 % IV SOLN
Freq: Once | INTRAVENOUS | Status: AC
  Filled 2020-08-22: qty 250

## 2020-08-22 MED ORDER — PALONOSETRON HCL INJECTION 0.25 MG/5ML
0.2500 mg | Freq: Once | INTRAVENOUS | Status: AC
  Administered 2020-08-22: 0.25 mg via INTRAVENOUS

## 2020-08-22 MED ORDER — ALTEPLASE 2 MG IJ SOLR
INTRAMUSCULAR | Status: AC
  Filled 2020-08-22: qty 2

## 2020-08-22 MED ORDER — HEPARIN SOD (PORK) LOCK FLUSH 100 UNIT/ML IV SOLN
500.0000 [IU] | Freq: Once | INTRAVENOUS | Status: AC | PRN
  Administered 2020-08-22: 500 [IU]
  Filled 2020-08-22: qty 5

## 2020-08-22 MED ORDER — DOXORUBICIN HCL LIPOSOMAL CHEMO INJECTION 2 MG/ML
30.0000 mg/m2 | Freq: Once | INTRAVENOUS | Status: DC
  Filled 2020-08-22: qty 32

## 2020-08-22 MED ORDER — FAMOTIDINE IN NACL 20-0.9 MG/50ML-% IV SOLN
INTRAVENOUS | Status: AC
  Filled 2020-08-22: qty 50

## 2020-08-22 MED ORDER — ALTEPLASE 2 MG IJ SOLR
2.0000 mg | Freq: Once | INTRAMUSCULAR | Status: AC
  Administered 2020-08-22: 2 mg
  Filled 2020-08-22: qty 2

## 2020-08-22 NOTE — Telephone Encounter (Signed)
Scheduled appts per 2/24 sch msg. Gave pt a print out of AVS.

## 2020-08-22 NOTE — Assessment & Plan Note (Signed)
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

## 2020-08-22 NOTE — Assessment & Plan Note (Signed)
She has profound pancytopenia due to recent treatment Her counts are improving with recent discontinuation of carboplatin We have ordered additional nutritional studies such as B12, folate and iron studies and they were unremarkable I plan to hold carboplatin this cycle and reassess She does not need transfusion support for now

## 2020-08-22 NOTE — Patient Instructions (Signed)
[facility] Discharge Instructions for Patients Receiving Chemotherapy  Today you received the following chemotherapy agents Doxil and Carboplatin  To help prevent nausea and vomiting after your treatment, we encourage you to take your nausea medication as directed.   If you develop nausea and vomiting that is not controlled by your nausea medication, call the clinic.   BELOW ARE SYMPTOMS THAT SHOULD BE REPORTED IMMEDIATELY:  *FEVER GREATER THAN 100.5 F  *CHILLS WITH OR WITHOUT FEVER  NAUSEA AND VOMITING THAT IS NOT CONTROLLED WITH YOUR NAUSEA MEDICATION  *UNUSUAL SHORTNESS OF BREATH  *UNUSUAL BRUISING OR BLEEDING  TENDERNESS IN MOUTH AND THROAT WITH OR WITHOUT PRESENCE OF ULCERS  *URINARY PROBLEMS  *BOWEL PROBLEMS  UNUSUAL RASH Items with * indicate a potential emergency and should be followed up as soon as possible.  Feel free to call the clinic should you have any questions or concerns. The clinic phone number is [phone].  Please show the CHEMO ALERT CARD at check-in to the Emergency Department and triage nurse.   

## 2020-08-22 NOTE — Progress Notes (Signed)
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[facility]) Clinically, she is improving She is completely asymptomatic today Her pancytopenia has improved with dropping carboplatin We will continue chemotherapy with Doxil only today I plan to order CT imaging in 3 weeks for further follow-up  Pancytopenia, acquired [facility]) She has profound pancytopenia due to recent treatment Her counts are improving with recent discontinuation of carboplatin We have ordered additional nutritional studies such as B12, folate and iron studies and they were unremarkable I plan to hold carboplatin this cycle and reassess She does not need transfusion support for now  PE (pulmonary thromboembolism) ([place]) She has recurrent PE despite improved disease control on recent imaging She will continue anticoagulation indefinitely She denies recent bleeding complication   Orders Placed This Encounter  Procedures  . CT ABDOMEN PELVIS W CONTRAST    Standing Status:   Future    Standing Expiration Date:   08/22/2021    Order Specific Question:   If indicated for the ordered procedure, I authorize the administration of contrast media per Radiology protocol    Answer:   Yes    Order Specific Question:   Preferred imaging location?    Answer:   [facility]    Order Specific Question:   Radiology Contrast Protocol - do NOT remove file path    Answer:   [email or web address]    All questions were answered. The patient knows to call the clinic with any problems, questions or concerns. The total time spent in the appointment was 20 minutes encounter with patients including review of chart and various tests results, discussions about plan of care and coordination of care plan   [name],  MD 08/22/2020 9:43 AM  INTERVAL HISTORY: Please see below for problem oriented charting. She returns for further follow-up She felt much better since last time I saw her No recent infection, fever or chills No dizziness chest pain or shortness of breath The patient denies any recent signs or symptoms of bleeding such as spontaneous epistaxis, hematuria or hematochezia.  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  She has bloating and early satiety. Shortness of breath with walking. She denied bleeding. She reported constipation with pain with defecation and narrowed stools   11/29/2018 Imaging   1. 13 cm complex cystic lesion in the central pelvis, highly suspicious for ovarian cystadenocarcinoma. 2. Diffuse peritoneal carcinomatosis with mild ascites. 3. Mild lymphadenopathy in porta hepatis and right cardiophrenic angle, suspicious for metastatic disease. 4. Moderate right and tiny left pleural effusions   12/05/2018 Tumor Marker   Patient's tumor was tested for the following markers: CA-125 Results of the tumor marker test revealed 1015.   12/06/2018 Cancer Staging   Staging form: Ovary, Fallopian Tube, and Primary Peritoneal Carcinoma, AJCC 8th Edition - Clinical: FIGO Stage IVA, calculated as Stage IV (cT3c, cN1, cM1) - Signed by [name], MD on 12/06/2018   12/09/2018 Pathology Results   PLEURAL FLUID, RIGHT (SPECIMEN 1 OF 1 COLLECTED 12/09/18): - MALIGNANT CELLS CONSISTENT WITH METASTATIC ADENOCARCINOMA - SEE COMMENT Comment The neoplastic cells are positive for cytokeratin 7 and Pax-8 but negative for cytokeratin 20, TTF-1, CDX-2 and Gata-3. Overall, the  phenotype is consistent with the clinical impression of gynecologic primary.    12/15/2018 Procedure   Placement of single lumen port a cath via right internal  jugular vein. The catheter tip lies at the cavo-atrial junction. A power injectable port a cath was placed and is ready for immediate use   12/16/2018 - 08/24/2019 Chemotherapy   The patient had carboplatin and taxol for chemotherapy treatment.  She had 7 cycles given neoadjuvant prior to surgery and 3 more cycles after surgery, for a total of 10 cycles of treatment    01/06/2019 Tumor Marker   Patient's tumor was tested for the following markers: CA-125 Results of the tumor marker test revealed 948   02/02/2019 Imaging   1. Massive pulmonary embolism, as discussed above. Given the mildly elevated RV to LV ratio of 0.95, this is associated with increased risk of morbidity and mortality. 2. Today's study demonstrates a mixed response to therapy. Specifically, while there has been regression of the bulky intraperitoneal metastatic disease and regression of previously noted pleural effusions, the large cystic mass in the central pelvis has increased in size compared to the prior study. 3. New right mild hydroureteronephrosis related to extrinsic compression on the distal third of the right ureter by the patient's large pelvic mass. 4. Scattered small pulmonary nodules (predominantly pleural based) appear stable compared to prior examinations. These are nonspecific but warrant continued attention on follow-up studies. 5. Aortic atherosclerosis, in addition to three-vessel coronary artery disease. Assessment for potential risk factor modification, dietary therapy or pharmacologic therapy may be warranted, if clinically indicated.   02/02/2019 - 02/04/2019 Hospital Admission   She was admitted the hospital due to DVT and PE   02/03/2019 Imaging   Bilateral venous Doppler US Right: Findings consistent with acute deep vein thrombosis involving the right femoral vein, right popliteal vein, right peroneal veins, right soleal veins, and right gastrocnemius veins. No cystic structure found in the popliteal  fossa. Left: There is no evidence of deep vein thrombosis in the lower extremity. No cystic structure found in the popliteal fossa   02/17/2019 Tumor Marker   Patient's tumor was tested for the following markers: CA-125 Results of the tumor marker test revealed 127   03/10/2019 Tumor Marker   Patient's tumor was tested for the following markers: CA-125. Results of the tumor marker test revealed 87.4   03/14/2019 - 03/16/2019 Hospital Admission   She was admitted to the hospital recently for weakness   03/31/2019 Tumor Marker   Patient's tumor was tested for the following markers: CA-125 Results of the tumor marker test revealed 59.2.   04/28/2019 Tumor Marker   Patient's tumor was tested for the following markers: CA-125 Results of the tumor marker test revealed 56.8   05/05/2019 Imaging   1. Interval decrease in size of the large cystic mass in the central pelvis. Mesenteric and omental soft tissue disease shows no substantial interval change. 2. The mild right hydroureteronephrosis seen previously has resolved in the interval. 3. Small residual nonobstructive thrombus identified in the inter lobar pulmonary artery common here into the lateral wall compatible with chronicity. 4. 14 mm subtle enhancing lesion in the anterior right liver is stable. This may be vascular malformation. Attention on follow-up recommended. 5. Stable appearance of the multiple small bilateral pulmonary nodules. Continued attention on follow-up recommended. 6.  Aortic Atherosclerois (ICD10-170.0)     06/06/2019 Pathology Results   A. OMENTUM, RESECTION: - Metastatic carcinoma. B. RIGHT FALLOPIAN TUBE AND OVARY, SALPINGOOOPHORECTOMY: - High-grade serous  carcinoma, spanning 9 cm. - No surface involvement identified. - Fallopian tube involved by carcinoma. - See oncology table. C. PERITONEAL NODULE, EXCISION: - Metastatic carcinoma. ONCOLOGY TABLE: OVARY or FALLOPIAN TUBE or PRIMARY PERITONEUM: Procedure:  Right salpingo-oophorectomy, omental resection, and peritoneal biopsy. Specimen Integrity: Intact Tumor Site: Right ovary Ovarian Surface Involvement (required only if applicable): Not identified Tumor Size: 9 cm Histologic Type: High-grade serous carcinoma Histologic Grade: High-grade Implants (required for advanced stage serous/seromucinous borderline tumors only): Omentum, peritoneum. Other Tissue/ Organ Involvement: Right fallopian tube Largest Extrapelvic Peritoneal Focus (required only if applicable): 7.3 cm Peritoneal/Ascitic Fluid: Positive pleural fluid pre neoadjuvant therapy. Treatment Effect (required only for high-grade serous carcinomas): Probable treatment effect present. Regional Lymph Nodes: No lymph nodes submitted or found Pathologic Stage Classification (pTNM, AJCC 8th Edition): ypT3c, ypNX Representative Tumor Block: B2   06/06/2019 Surgery   Preoperative Diagnosis: stage IV ovarian cancer, s/p neoadjuvant chemotherapy, history of recent PE.  Procedure(s) Performed: Exploratory laparotomy with right salpingo-oophorectomy, omentectomy radical tumor debulking for ovarian cancer .   Surgeon: Thereasa Solo, MD.    Operative Findings:  Omental cake adherent to anterior abdominal wall and hepatic flexure. 10cm right tube and ovary. Surgically absent uterus and left tube and ovary. Granular nodularity across right diaphragm.    This represented an optimal cytoreduction (R0) with no gross visible disease remaining.    07/06/2019 Tumor Marker   Patient's tumor was tested for the following markers:CA-125 Results of the tumor marker test revealed 16.4   08/10/2019 Genetic Testing   Negative genetic testing. No pathogenic variants identified. VUS in BRCA2 called [id number] identified on the [facility] CancerNext+RNAinsight panel. The report date is 08/10/2019.  The CancerNext+RNAinsight gene panel offered by [employer] includes sequencing and rearrangement analysis for the following  36 genes: APC*, ATM*, AXIN2, BARD1, BMPR1A, BRCA1*, BRCA2*, BRIP1*, CDH1*, CDK4, CDKN2A, CHEK2*, DICER1, MLH1*, MSH2*, MSH3, MSH6*, MUTYH*, NBN, NF1*, NTHL1, PALB2*, PMS2*, PTEN*, RAD51C*, RAD51D*, RECQL, SMAD4, SMARCA4, STK11 and TP53* (sequencing and deletion/duplication); HOXB13, POLD1 and POLE (sequencing only); EPCAM and GREM1 (deletion/duplication only). DNA and RNA analyses performed for * genes.   HRD (somatic) testing was initially ordered and was not completed due to insufficient amount of tumor sample.    08/24/2019 Tumor Marker   Patient's tumor was tested for the following markers: CA-125 Results of the tumor marker test revealed 12.2   09/20/2019 Imaging   1. Interval debulking, bilateral salpingo oophorectomy and omentectomy with resection of the dominant pelvic cystic lesion and the bandlike soft tissue seen previously in the right mesentery/omentum. Today's study is status shows new postoperative baseline for follow-up. 2. 6 mm gastrohepatic ligament nodule seen on the previous study is stable. Continued attention on follow-up recommended. 3. No new or progressive findings in the chest, abdomen, or pelvis to suggest disease progression. 4. Stable 1.4 cm focus of homogeneous enhancement in the anterior right liver. Stable since at least 11/29/2018 and likely benign. Continued attention on follow-up recommended. 5. Aortic Atherosclerosis (ICD10-I70.0).   09/20/2019 Tumor Marker   Patient's tumor was tested for the following markers: CA-125 Results of the tumor marker test revealed 11.9   12/26/2019 Tumor Marker   Patient's tumor was tested for the following markers: CA-125 Results of the tumor marker test revealed 16.5   03/25/2020 Imaging   1. Interval progression of peritoneal disease which predominantly involves the serosal surface of the proximal and distal transverse colon, and descending colon. 2. New bilateral inguinal adenopathy. 3. No ascites. 4. Aortic  atherosclerosis.  03/25/2020 Tumor Marker   Patient's tumor was tested for the following markers: CA-125 Results of the tumor marker test revealed 249   03/28/2020 Echocardiogram    1. Borderline LV Strain; A2C view represents the most accurate acquisition.. Left ventricular ejection fraction, by estimation, is 60 to 65%. Left ventricular ejection fraction by PLAX is 63 %. The left ventricle has normal function. The left ventricle demonstrates regional wall motion abnormalities (see scoring diagram/findings for description). Left ventricular diastolic parameters were normal.  2. Right ventricular systolic function is normal. The right ventricular size is normal.  3. Left atrial size was mildly dilated.  4. The mitral valve is normal in structure. No evidence of mitral valve regurgitation.  5. The aortic valve is grossly normal. Aortic valve regurgitation is mild.  6. The inferior vena cava is normal in size with greater than 50% respiratory variability, suggesting right atrial pressure of 3 mmHg.   04/02/2020 Tumor Marker   Patient's tumor was tested for the following markers: CA-125 Results of the tumor marker test revealed 304   04/02/2020 -  Chemotherapy   The patient had carboplatin and Doxil for chemotherapy treatment.     05/27/2020 Tumor Marker   Patient's tumor was tested for the following markers: CA-125. Results of the tumor marker test revealed 42.7   06/10/2020 Imaging   Improving peritoneal disease and serosal implants along the left colon, as above.   Improving bilateral inguinal nodes.   06/18/2020 Echocardiogram    1. Left ventricular ejection fraction, by estimation, is 60 to 65%. The left ventricle has normal function. The left ventricle has no regional wall motion abnormalities. There is mild left ventricular hypertrophy of the basal-septal segment. Left ventricular diastolic parameters were normal. The average left ventricular global longitudinal strain is -25.0 %.  The global longitudinal strain is normal.  2. Right ventricular systolic function is normal. The right ventricular size is normal.  3. The mitral valve is grossly normal. Mild mitral valve regurgitation.  4. Tricuspid valve regurgitation is moderate.  5. The aortic valve is tricuspid. There is mild calcification of the aortic valve. There is mild thickening of the aortic valve. Aortic valve regurgitation is mild to moderate.  6. The inferior vena cava is normal in size with <50% respiratory variability, suggesting right atrial pressure of 8 mmHg.   06/25/2020 Tumor Marker   Patient's tumor was tested for the following markers: CA-125 Results of the tumor marker test revealed 23.9   07/15/2020 Imaging   IMPRESSION: 1. Mild amount of acute pulmonary embolism within multiple middle lobe and lower lobe branches of the right pulmonary artery. 2.   Small amount of chronic right pulmonary embolism. 3.   Mild posterior left basilar atelectasis and/or infiltrate. 4.   Small left pleural effusion. 5. Aortic atherosclerosis.   Aortic Atherosclerosis (ICD10-I70.0).     07/15/2020 - 07/17/2020 Hospital Admission   She was admitted to a local hospital after presentation with chest pain and shortness of breath and was found to have significant pulmonary embolism She was anticoagulated and discharged   07/16/2020 Echocardiogram   1. Left ventricular ejection fraction, by estimation, is 60 to 65%. The left ventricle has normal function. The left ventricle has no regional wall motion abnormalities. There is mild left ventricular hypertrophy.  Left ventricular diastolic parameters are indeterminate.   2. Right ventricular systolic function is normal. The right ventricular size is normal. There is normal pulmonary artery systolic pressure. The estimated right ventricular systolic pressure is 36.1 mmHg.  3. The mitral valve is grossly normal. Trivial mitral valve regurgitation.   4. The aortic valve is  tricuspid. Aortic valve regurgitation is mild.   5. The inferior vena cava is normal in size with greater than 50% respiratory variability, suggesting right atrial pressure of 3 mmHg.    07/25/2020 Tumor Marker   Patient's tumor was tested for the following markers: CA-125 Results of the tumor marker test revealed 36.4     REVIEW OF SYSTEMS:   Constitutional: Denies fevers, chills or abnormal weight loss Eyes: Denies blurriness of vision Ears, nose, mouth, throat, and face: Denies mucositis or sore throat Respiratory: Denies cough, dyspnea or wheezes Cardiovascular: Denies palpitation, chest discomfort or lower extremity swelling Gastrointestinal:  Denies nausea, heartburn or change in bowel habits Skin: Denies abnormal skin rashes Lymphatics: Denies new lymphadenopathy or easy bruising Neurological:Denies numbness, tingling or new weaknesses Behavioral/Psych: Mood is stable, no new changes  All other systems were reviewed with the patient and are negative.  I have reviewed the past medical history, past surgical history, social history and family history with the patient and they are unchanged from previous note.  ALLERGIES:  has No Known Allergies.  MEDICATIONS:  Current Outpatient Medications  Medication Sig Dispense Refill  . acetaminophen (TYLENOL) 325 MG tablet Take 650 mg by mouth every 6 (six) hours as needed for moderate pain or headache.     Marland Kitchen apixaban (ELIQUIS) 5 MG TABS tablet Take 1 tablet (5 mg total) by mouth 2 (two) times daily. --Start around 08/14/20 after you complete your initial starter pack 60 tablet 5  . Apixaban Starter Pack, 438m and 572m (ELIQUIS DVT/PE STARTER PACK) Take as directed on package: start with two-38m638mablets twice daily for 7 days. On day 8, switch to one-38mg46mblet twice daily. 1 each 0  . cholecalciferol (VITAMIN D3) 25 MCG (1000 UNIT) tablet Take 1,000 Units by mouth daily.    . leMarland Kitchenothyroxine (SYNTHROID) 75 MCG tablet Take 1 tablet (75 mcg  total) by mouth daily before breakfast. 30 tablet 3  . lidocaine-prilocaine (EMLA) cream Apply to affected area once (Patient taking differently: Apply 1 application topically daily as needed (port access).) 30 g 3  . loratadine (CLARITIN) 10 MG tablet Take 10 mg by mouth daily.    . Melatonin 3 MG TABS Take by mouth.    . ondansetron (ZOFRAN) 8 MG tablet Take 8 mg by mouth every 8 (eight) hours as needed.    . pantoprazole (PROTONIX) 40 MG tablet Take 1 tablet (40 mg total) by mouth daily. 30 tablet 4  . prochlorperazine (COMPAZINE) 10 MG tablet Take 10 mg by mouth every 6 (six) hours as needed.    . senna-docusate (SENOKOT-S) 8.6-50 MG tablet Take 2 tablets by mouth at bedtime. 60 tablet 3  . vitamin B-12 1000 MCG tablet Take 1 tablet (1,000 mcg total) by mouth daily. 30 tablet 3   No current facility-administered medications for this visit.    PHYSICAL EXAMINATION: ECOG PERFORMANCE STATUS: 1 - Symptomatic but completely ambulatory  Vitals:   08/22/20 0909  BP: (!) 144/65  Pulse: 84  Resp: 20  Temp: (!) 97.5 F (36.4 C)  SpO2: 99%   Filed Weights   08/22/20 0909  Weight: 215 lb 12.8 oz (97.9 kg)    GENERAL:alert, no distress and comfortable SKIN: skin color, texture, turgor are normal, no rashes or significant lesions EYES: normal, Conjunctiva are pink and non-injected, sclera clear OROPHARYNX:no exudate, no erythema and lips, buccal mucosa, and tongue  normal  NECK: supple, thyroid normal size, non-tender, without nodularity LYMPH:  no palpable lymphadenopathy in the cervical, axillary or inguinal LUNGS: clear to auscultation and percussion with normal breathing effort HEART: regular rate & rhythm and no murmurs and no lower extremity edema ABDOMEN:abdomen soft, non-tender and normal bowel sounds Musculoskeletal:no cyanosis of digits and no clubbing  NEURO: alert & oriented x 3 with fluent speech, no focal motor/sensory deficits  LABORATORY DATA:  I have reviewed the data  as listed    Component Value Date/Time   NA 139 07/25/2020 1208   K 3.9 07/25/2020 1208   CL 104 07/25/2020 1208   CO2 27 07/25/2020 1208   GLUCOSE 128 (H) 07/25/2020 1208   BUN 14 07/25/2020 1208   CREATININE 1.00 07/25/2020 1208   CALCIUM 9.7 07/25/2020 1208   PROT 7.0 07/25/2020 1208   ALBUMIN 3.6 07/25/2020 1208   AST 18 07/25/2020 1208   ALT 20 07/25/2020 1208   ALKPHOS 91 07/25/2020 1208   BILITOT 0.3 07/25/2020 1208   GFRNONAA 58 (L) 07/25/2020 1208   GFRAA >60 03/25/2020 1144    No results found for: SPEP, UPEP  Lab Results  Component Value Date   WBC 3.9 (L) 08/22/2020   NEUTROABS 1.6 (L) 08/22/2020   HGB 11.5 (L) 08/22/2020   HCT 34.3 (L) 08/22/2020   MCV 111.0 (H) 08/22/2020   PLT 261 08/22/2020      Chemistry      Component Value Date/Time   NA 139 07/25/2020 1208   K 3.9 07/25/2020 1208   CL 104 07/25/2020 1208   CO2 27 07/25/2020 1208   BUN 14 07/25/2020 1208   CREATININE 1.00 07/25/2020 1208      Component Value Date/Time   CALCIUM 9.7 07/25/2020 1208   ALKPHOS 91 07/25/2020 1208   AST 18 07/25/2020 1208   ALT 20 07/25/2020 1208   BILITOT 0.3 07/25/2020 1208

## 2020-08-22 NOTE — Assessment & Plan Note (Signed)
Clinically, she is improving She is completely asymptomatic today Her pancytopenia has improved with dropping carboplatin We will continue chemotherapy with Doxil only today I plan to order CT imaging in 3 weeks for further follow-up

## 2020-08-23 LAB — CA 125: Cancer Antigen (CA) 125: 50.4 U/mL — ABNORMAL HIGH (ref 0.0–38.1)

## 2020-09-09 ENCOUNTER — Other Ambulatory Visit: Payer: Self-pay

## 2020-09-09 ENCOUNTER — Ambulatory Visit (HOSPITAL_COMMUNITY)
Admission: RE | Admit: 2020-09-09 | Discharge: 2020-09-09 | Disposition: A | Discharge status: Home / Self Care | Payer: Medicare Other | Source: Ambulatory Visit | Attending: Hematology and Oncology | Admitting: Hematology and Oncology

## 2020-09-09 ENCOUNTER — Encounter (HOSPITAL_COMMUNITY): Payer: Self-pay

## 2020-09-09 DIAGNOSIS — C561 Malignant neoplasm of right ovary: Secondary | ICD-10-CM | POA: Insufficient documentation

## 2020-09-09 DIAGNOSIS — K6389 Other specified diseases of intestine: Secondary | ICD-10-CM | POA: Diagnosis not present

## 2020-09-09 DIAGNOSIS — C786 Secondary malignant neoplasm of retroperitoneum and peritoneum: Secondary | ICD-10-CM | POA: Diagnosis not present

## 2020-09-09 MED ORDER — IOHEXOL 300 MG/ML  SOLN
100.0000 mL | Freq: Once | INTRAMUSCULAR | Status: AC | PRN
  Administered 2020-09-09: 100 mL via INTRAVENOUS

## 2020-09-09 MED ORDER — HEPARIN SOD (PORK) LOCK FLUSH 100 UNIT/ML IV SOLN
500.0000 [IU] | Freq: Once | INTRAVENOUS | Status: AC
  Administered 2020-09-09: 500 [IU] via INTRAVENOUS

## 2020-09-09 MED ORDER — HEPARIN SOD (PORK) LOCK FLUSH 100 UNIT/ML IV SOLN
INTRAVENOUS | Status: AC
  Filled 2020-09-09: qty 5

## 2020-09-10 ENCOUNTER — Other Ambulatory Visit: Payer: Self-pay | Admitting: Hematology and Oncology

## 2020-09-10 ENCOUNTER — Telehealth: Payer: Self-pay

## 2020-09-10 ENCOUNTER — Telehealth: Payer: Self-pay | Admitting: Pharmacist

## 2020-09-10 ENCOUNTER — Encounter: Payer: Self-pay | Admitting: Hematology and Oncology

## 2020-09-10 ENCOUNTER — Inpatient Hospital Stay: Payer: Medicare Other | Attending: Gynecologic Oncology | Admitting: Hematology and Oncology

## 2020-09-10 ENCOUNTER — Other Ambulatory Visit: Payer: Self-pay

## 2020-09-10 DIAGNOSIS — Z86711 Personal history of pulmonary embolism: Secondary | ICD-10-CM | POA: Diagnosis not present

## 2020-09-10 DIAGNOSIS — Z90721 Acquired absence of ovaries, unilateral: Secondary | ICD-10-CM | POA: Insufficient documentation

## 2020-09-10 DIAGNOSIS — Z9221 Personal history of antineoplastic chemotherapy: Secondary | ICD-10-CM | POA: Insufficient documentation

## 2020-09-10 DIAGNOSIS — L271 Localized skin eruption due to drugs and medicaments taken internally: Secondary | ICD-10-CM

## 2020-09-10 DIAGNOSIS — C786 Secondary malignant neoplasm of retroperitoneum and peritoneum: Secondary | ICD-10-CM | POA: Insufficient documentation

## 2020-09-10 DIAGNOSIS — D61818 Other pancytopenia: Secondary | ICD-10-CM

## 2020-09-10 DIAGNOSIS — Z79899 Other long term (current) drug therapy: Secondary | ICD-10-CM | POA: Diagnosis not present

## 2020-09-10 DIAGNOSIS — Z7901 Long term (current) use of anticoagulants: Secondary | ICD-10-CM | POA: Insufficient documentation

## 2020-09-10 DIAGNOSIS — C561 Malignant neoplasm of right ovary: Secondary | ICD-10-CM | POA: Insufficient documentation

## 2020-09-10 DIAGNOSIS — Z7189 Other specified counseling: Secondary | ICD-10-CM | POA: Diagnosis not present

## 2020-09-10 MED ORDER — NIRAPARIB TOSYLATE 100 MG PO CAPS: 200.0000 mg | ORAL_CAPSULE | Freq: Every day | ORAL | 11 refills | Status: DC

## 2020-09-10 NOTE — Telephone Encounter (Signed)
Oral Oncology Pharmacist Encounter  Received new prescription for Zejula (niraparib) for the maintenance treatment of recurrent ovarian cancer, planned duration until disease progression or unacceptable drug toxicity.  Prescription dose and frequency assessed for appropriateness. Appropriate for therapy initiation. Per MD starting on dose reduction due to history of pancytopenia.  CBC w/ Diff and CMP from 08/22/20 assessed, noted Scr of 1.20 mg/dL - no renal dose adjustments required. Pt with WBC 3.9 K/uL (ANC 1.6 K/uL) - pt starting on dose reduction per MD as mentioned above. Labs will be closely monitored in clinic after treatment initiation.   Current medication list in Epic reviewed, no relevant/significant DDIs with Zejula identified.  Evaluated chart and no patient barriers to medication adherence noted.   Patient agreement for Zejula treatment documented in MD note on 09/10/20.  Prescription has been e-scribed to the [facility] for benefits analysis and approval.  Oral Oncology Clinic will continue to follow for insurance authorization, copayment issues, initial counseling and start date.  Leron Croak, PharmD, BCPS Hematology/Oncology Clinical Pharmacist [facility] [phone] 09/10/2020 1:44 PM

## 2020-09-10 NOTE — Assessment & Plan Note (Signed)
She is not symptomatic recently As above, I plan upfront dose adjustment for niraparib We will monitor her blood count carefully within a week of start date of niraparib in the future

## 2020-09-10 NOTE — Telephone Encounter (Signed)
Oral Oncology Patient Advocate Encounter  Prior Authorization for Paula Peterson has been approved.    PA# [id number] Effective dates: 09/10/20 through 06/28/21  Patients co-pay is $[phone]  Oral Oncology Clinic will continue to follow.    [place] Patient [place] Phone [phone] Fax [phone] 09/10/2020 3:05 PM

## 2020-09-10 NOTE — Progress Notes (Signed)
[facility] OFF[facility] PROGRESS NOTE  Patient Care Team: Caryl Bis, MD as PCP - General (Family Medicine) Awanda Mink Craige Cotta, RN as Oncology Nurse Navigator (Oncology)  ASSESSMENT & PLAN:  Right ovarian epithelial cancer [facility]) I have reviewed imaging studies with the patient and her daughter over the phone The patient has minimal residual disease noted on CT imaging She has significant difficulties tolerating chemotherapy despite multiple dose adjustment She had recent hand-foot syndrome from chemotherapy I recommend discontinuation of treatment and switching her over to maintenance therapy I prefers niraparib over bevacizumab given her history of recurrent blood clots  I have reviewed guidelines with the patient The goal of treatment is for maintenance treatment The following recommendation is based on NCCN guidelines from publication in [place] of Medicine.  Niraparib Maintenance Therapy in Platinum-Sensitive, Recurrent Ovarian Cancer M.R. Irineo Axon, Kevan Ny, J. Herrstedt, A.M. Rosey Bath, Lamar Blinks, [place]. Devin Going. Andre Lefort, D. Lorusso, ISheria Lang, N.E. Parnell, C. Marth, R. M?dry, R.D. Lennox Laity, J.S. Berek, A. Dorum, A.V. Tinker, A. [place], A. Gonzalez-Martin, P. Follana, B. Benigno, P. Lutricia Feil, L. Rosanna Randy, B.J. Rimel, Mickle Mallory, J.P. Solon Palm, and U.A. Matulonis, for the ENGOT-OV16/NOVA Investigators [employer] [phone]. DOI: [email or web address]/[id number]  Niraparib is an oral poly(adenosine diphosphate [ADP]-ribose) polymerase (PARP) 1/2 inhibitor that has shown clinical activity in patients with ovarian cancer. We sought to evaluate the efficacy of niraparib versus placebo as maintenance treatment for patients with platinum-sensitive, recurrent ovarian cancer. Methods In this randomized, double-blind, phase 3 trial, patients were categorized according to the presence or absence of a germline BRCA mutation (gBRCA cohort and non-gBRCA  cohort) and the type of non-gBRCA mutation and were randomly assigned in a 2:1 ratio to receive niraparib (300 mg) or placebo once daily. The primary end point was progression-free survival. Results Of 553 enrolled patients, 203 were in the [facility] cohort (with 138 assigned to niraparib and 65 to placebo), and 350 patients were in the non-gBRCA cohort (with 234 assigned to niraparib and 116 to placebo). Patients in the niraparib group had a significantly longer median duration of progression-free survival than did those in the placebo group, including 21.0 vs. 5.5 months in the [facility] cohort (hazard ratio, 0.27; 95% confidence interval [CI], 0.17 to 0.41), as compared with 12.9 months vs. 3.8 months in the non-gBRCA cohort for patients who had tumors with homologous recombination deficiency (HRD) (hazard ratio, 0.38; 95% CI, 0.24 to 0.59) and 9.3 months vs. 3.9 months in the overall non-gBRCA cohort (hazard ratio, 0.45; 95% CI, 0.34 to 0.61; P<0.001 for all three comparisons). The most common grade 3 or 4 adverse events that were reported in the niraparib group were thrombocytopenia (in 33.8%), anemia (in 25.3%), and neutropenia (in 19.6%), which were managed with dose modifications. Conclusions Among patients with platinum-sensitive, recurrent ovarian cancer, the median duration of progression-free survival was significantly longer among those receiving niraparib than among those receiving placebo, regardless of the presence or absence of gBRCA mutations or HRD status, with moderate bone marrow toxicity. (Funded by [employer].gov number, [id number].)  The risks, benefits, side effects of treatment were fully discussed with the patient and she agreed to proceed I would prefer to start with lower dose for the patient given her history of severe pancytopenia I will get insurance prior approval and hopefully we can start her treatment within a week or 2 She is aware of frequent blood monitoring  required within the first month of therapy   Pancytopenia,  acquired [facility]) She is not symptomatic recently As above, I plan upfront dose adjustment for niraparib We will monitor her blood count carefully within a week of start date of niraparib in the future  Hand foot syndrome This is likely due to recent treatment with Doxil Observe closely and we will manage this conservatively  Goals of care, counseling/discussion I reviewed the plan of care with the patient and her daughter over the phone and they are in agreement to proceed Recommend minimum 3 months of treatment before repeating imaging study   No orders of the defined types were placed in this encounter.   All questions were answered. The patient knows to call the clinic with any problems, questions or concerns. The total time spent in the appointment was 40 minutes encounter with patients including review of chart and various tests results, discussions about plan of care and coordination of care plan   [name], MD 09/10/2020 1:29 PM  INTERVAL HISTORY: Please see below for problem oriented charting. She returns to review test results Her daughter is also available to consult over the phone Her only major symptoms is hand-foot syndrome affecting predominantly on her hands It is not painful She denies recent nausea Her energy level is fair The patient denies any recent signs or symptoms of bleeding such as spontaneous epistaxis, hematuria or hematochezia.   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  She has bloating and early satiety. Shortness of breath with walking. She denied bleeding. She reported constipation with pain with defecation and narrowed stools   11/29/2018 Imaging   1. 13 cm complex cystic lesion in the central pelvis, highly suspicious for  ovarian cystadenocarcinoma. 2. Diffuse peritoneal carcinomatosis with mild ascites. 3. Mild lymphadenopathy in porta hepatis and right cardiophrenic angle, suspicious for metastatic disease. 4. Moderate right and tiny left pleural effusions   12/05/2018 Tumor Marker   Patient's tumor was tested for the following markers: CA-125 Results of the tumor marker test revealed 1015.   12/06/2018 Cancer Staging   Staging form: Ovary, Fallopian Tube, and Primary Peritoneal Carcinoma, AJCC 8th Edition - Clinical: FIGO Stage IVA, calculated as Stage IV (cT3c, cN1, cM1) - Signed by [name], MD on 12/06/2018   12/09/2018 Pathology Results   PLEURAL FLUID, RIGHT (SPECIMEN 1 OF 1 COLLECTED 12/09/18): - MALIGNANT CELLS CONSISTENT WITH METASTATIC ADENOCARCINOMA - SEE COMMENT Comment The neoplastic cells are positive for cytokeratin 7 and Pax-8 but negative for cytokeratin 20, TTF-1, CDX-2 and Gata-3. Overall, the phenotype is consistent with the clinical impression of gynecologic primary.    12/15/2018 Procedure   Placement of single lumen port a cath via right internal jugular vein. The catheter tip lies at the cavo-atrial junction. A power injectable port a cath was placed and is ready for immediate use   12/16/2018 - 08/24/2019 Chemotherapy   The patient had carboplatin and taxol for chemotherapy treatment.  She had 7 cycles given neoadjuvant prior to surgery and 3 more cycles after surgery, for a total of 10 cycles of treatment    01/06/2019 Tumor Marker   Patient's tumor was tested for the following markers: CA-125 Results of the tumor marker test revealed 948   02/02/2019 Imaging   1. Massive pulmonary embolism, as discussed above. Given the mildly elevated RV to LV ratio of 0.95, this is associated with increased risk of  morbidity and mortality. 2. Today's study demonstrates a mixed response to therapy. Specifically, while there has been regression of the bulky intraperitoneal metastatic disease and  regression of previously noted pleural effusions, the large cystic mass in the central pelvis has increased in size compared to the prior study. 3. New right mild hydroureteronephrosis related to extrinsic compression on the distal third of the right ureter by the patient's large pelvic mass. 4. Scattered small pulmonary nodules (predominantly pleural based) appear stable compared to prior examinations. These are nonspecific but warrant continued attention on follow-up studies. 5. Aortic atherosclerosis, in addition to three-vessel coronary artery disease. Assessment for potential risk factor modification, dietary therapy or pharmacologic therapy may be warranted, if clinically indicated.   02/02/2019 - 02/04/2019 Hospital Admission   She was admitted the hospital due to DVT and PE   02/03/2019 Imaging   Bilateral venous Doppler US Right: Findings consistent with acute deep vein thrombosis involving the right femoral vein, right popliteal vein, right peroneal veins, right soleal veins, and right gastrocnemius veins. No cystic structure found in the popliteal fossa. Left: There is no evidence of deep vein thrombosis in the lower extremity. No cystic structure found in the popliteal fossa   02/17/2019 Tumor Marker   Patient's tumor was tested for the following markers: CA-125 Results of the tumor marker test revealed 127   03/10/2019 Tumor Marker   Patient's tumor was tested for the following markers: CA-125. Results of the tumor marker test revealed 87.4   03/14/2019 - 03/16/2019 Hospital Admission   She was admitted to the hospital recently for weakness   03/31/2019 Tumor Marker   Patient's tumor was tested for the following markers: CA-125 Results of the tumor marker test revealed 59.2.   04/28/2019 Tumor Marker   Patient's tumor was tested for the following markers: CA-125 Results of the tumor marker test revealed 56.8   05/05/2019 Imaging   1. Interval decrease in size of the large cystic  mass in the central pelvis. Mesenteric and omental soft tissue disease shows no substantial interval change. 2. The mild right hydroureteronephrosis seen previously has resolved in the interval. 3. Small residual nonobstructive thrombus identified in the inter lobar pulmonary artery common here into the lateral wall compatible with chronicity. 4. 14 mm subtle enhancing lesion in the anterior right liver is stable. This may be vascular malformation. Attention on follow-up recommended. 5. Stable appearance of the multiple small bilateral pulmonary nodules. Continued attention on follow-up recommended. 6.  Aortic Atherosclerois (ICD10-170.0)     06/06/2019 Pathology Results   A. OMENTUM, RESECTION: - Metastatic carcinoma. B. RIGHT FALLOPIAN TUBE AND OVARY, SALPINGOOOPHORECTOMY: - High-grade serous carcinoma, spanning 9 cm. - No surface involvement identified. - Fallopian tube involved by carcinoma. - See oncology table. C. PERITONEAL NODULE, EXCISION: - Metastatic carcinoma. ONCOLOGY TABLE: OVARY or FALLOPIAN TUBE or PRIMARY PERITONEUM: Procedure: Right salpingo-oophorectomy, omental resection, and peritoneal biopsy. Specimen Integrity: Intact Tumor Site: Right ovary Ovarian Surface Involvement (required only if applicable): Not identified Tumor Size: 9 cm Histologic Type: High-grade serous carcinoma Histologic Grade: High-grade Implants (required for advanced stage serous/seromucinous borderline tumors only): Omentum, peritoneum. Other Tissue/ Organ Involvement: Right fallopian tube Largest Extrapelvic Peritoneal Focus (required only if applicable): 7.3 cm Peritoneal/Ascitic Fluid: Positive pleural fluid pre neoadjuvant therapy. Treatment Effect (required only for high-grade serous carcinomas): Probable treatment effect present. Regional Lymph Nodes: No lymph nodes submitted or found Pathologic Stage Classification (pTNM, AJCC 8th Edition): ypT3c, ypNX Representative Tumor Block: B2    06/06/2019 Surgery  Preoperative Diagnosis: stage IV ovarian cancer, s/p neoadjuvant chemotherapy, history of recent PE.  Procedure(s) Performed: Exploratory laparotomy with right salpingo-oophorectomy, omentectomy radical tumor debulking for ovarian cancer .   Surgeon: Thereasa Solo, MD.    Operative Findings:  Omental cake adherent to anterior abdominal wall and hepatic flexure. 10cm right tube and ovary. Surgically absent uterus and left tube and ovary. Granular nodularity across right diaphragm.    This represented an optimal cytoreduction (R0) with no gross visible disease remaining.    07/06/2019 Tumor Marker   Patient's tumor was tested for the following markers:CA-125 Results of the tumor marker test revealed 16.4   08/10/2019 Genetic Testing   Negative genetic testing. No pathogenic variants identified. VUS in BRCA2 called [id number] identified on the [facility] CancerNext+RNAinsight panel. The report date is 08/10/2019.  The CancerNext+RNAinsight gene panel offered by [facility] includes sequencing and rearrangement analysis for the following 36 genes: APC*, ATM*, AXIN2, BARD1, BMPR1A, BRCA1*, BRCA2*, BRIP1*, CDH1*, CDK4, CDKN2A, CHEK2*, DICER1, MLH1*, MSH2*, MSH3, MSH6*, MUTYH*, NBN, NF1*, NTHL1, PALB2*, PMS2*, PTEN*, RAD51C*, RAD51D*, RECQL, SMAD4, SMARCA4, STK11 and TP53* (sequencing and deletion/duplication); HOXB13, POLD1 and POLE (sequencing only); EPCAM and GREM1 (deletion/duplication only). DNA and RNA analyses performed for * genes.   HRD (somatic) testing was initially ordered and was not completed due to insufficient amount of tumor sample.    08/24/2019 Tumor Marker   Patient's tumor was tested for the following markers: CA-125 Results of the tumor marker test revealed 12.2   09/20/2019 Imaging   1. Interval debulking, bilateral salpingo oophorectomy and omentectomy with resection of the dominant pelvic cystic lesion and the bandlike soft tissue seen previously in the right  mesentery/omentum. Today's study is status shows new postoperative baseline for follow-up. 2. 6 mm gastrohepatic ligament nodule seen on the previous study is stable. Continued attention on follow-up recommended. 3. No new or progressive findings in the chest, abdomen, or pelvis to suggest disease progression. 4. Stable 1.4 cm focus of homogeneous enhancement in the anterior right liver. Stable since at least 11/29/2018 and likely benign. Continued attention on follow-up recommended. 5. Aortic Atherosclerosis (ICD10-I70.0).   09/20/2019 Tumor Marker   Patient's tumor was tested for the following markers: CA-125 Results of the tumor marker test revealed 11.9   12/26/2019 Tumor Marker   Patient's tumor was tested for the following markers: CA-125 Results of the tumor marker test revealed 16.5   03/25/2020 Imaging   1. Interval progression of peritoneal disease which predominantly involves the serosal surface of the proximal and distal transverse colon, and descending colon. 2. New bilateral inguinal adenopathy. 3. No ascites. 4. Aortic atherosclerosis.   03/25/2020 Tumor Marker   Patient's tumor was tested for the following markers: CA-125 Results of the tumor marker test revealed 249   03/28/2020 Echocardiogram    1. Borderline LV Strain; A2C view represents the most accurate acquisition.. Left ventricular ejection fraction, by estimation, is 60 to 65%. Left ventricular ejection fraction by PLAX is 63 %. The left ventricle has normal function. The left ventricle demonstrates regional wall motion abnormalities (see scoring diagram/findings for description). Left ventricular diastolic parameters were normal.  2. Right ventricular systolic function is normal. The right ventricular size is normal.  3. Left atrial size was mildly dilated.  4. The mitral valve is normal in structure. No evidence of mitral valve regurgitation.  5. The aortic valve is grossly normal. Aortic valve regurgitation is  mild.  6. The inferior vena cava is normal in size with greater  than 50% respiratory variability, suggesting right atrial pressure of 3 mmHg.   04/02/2020 Tumor Marker   Patient's tumor was tested for the following markers: CA-125 Results of the tumor marker test revealed 304   04/02/2020 - 08/22/2020 Chemotherapy   The patient had carboplatin and Doxil for chemotherapy treatment.     05/27/2020 Tumor Marker   Patient's tumor was tested for the following markers: CA-125. Results of the tumor marker test revealed 42.7   06/10/2020 Imaging   Improving peritoneal disease and serosal implants along the left colon, as above.   Improving bilateral inguinal nodes.   06/18/2020 Echocardiogram    1. Left ventricular ejection fraction, by estimation, is 60 to 65%. The left ventricle has normal function. The left ventricle has no regional wall motion abnormalities. There is mild left ventricular hypertrophy of the basal-septal segment. Left ventricular diastolic parameters were normal. The average left ventricular global longitudinal strain is -25.0 %. The global longitudinal strain is normal.  2. Right ventricular systolic function is normal. The right ventricular size is normal.  3. The mitral valve is grossly normal. Mild mitral valve regurgitation.  4. Tricuspid valve regurgitation is moderate.  5. The aortic valve is tricuspid. There is mild calcification of the aortic valve. There is mild thickening of the aortic valve. Aortic valve regurgitation is mild to moderate.  6. The inferior vena cava is normal in size with <50% respiratory variability, suggesting right atrial pressure of 8 mmHg.   06/25/2020 Tumor Marker   Patient's tumor was tested for the following markers: CA-125 Results of the tumor marker test revealed 23.9   07/15/2020 Imaging   IMPRESSION: 1. Mild amount of acute pulmonary embolism within multiple middle lobe and lower lobe branches of the right pulmonary artery. 2.   Small  amount of chronic right pulmonary embolism. 3.   Mild posterior left basilar atelectasis and/or infiltrate. 4.   Small left pleural effusion. 5. Aortic atherosclerosis.   Aortic Atherosclerosis (ICD10-I70.0).     07/15/2020 - 07/17/2020 Hospital Admission   She was admitted to a local hospital after presentation with chest pain and shortness of breath and was found to have significant pulmonary embolism She was anticoagulated and discharged   07/16/2020 Echocardiogram   1. Left ventricular ejection fraction, by estimation, is 60 to 65%. The left ventricle has normal function. The left ventricle has no regional wall motion abnormalities. There is mild left ventricular hypertrophy.  Left ventricular diastolic parameters are indeterminate.   2. Right ventricular systolic function is normal. The right ventricular size is normal. There is normal pulmonary artery systolic pressure. The estimated right ventricular systolic pressure is 16.8 mmHg.   3. The mitral valve is grossly normal. Trivial mitral valve regurgitation.   4. The aortic valve is tricuspid. Aortic valve regurgitation is mild.   5. The inferior vena cava is normal in size with greater than 50% respiratory variability, suggesting right atrial pressure of 3 mmHg.    07/25/2020 Tumor Marker   Patient's tumor was tested for the following markers: CA-125 Results of the tumor marker test revealed 36.4   09/09/2020 Imaging   1. Mild improvement in peritoneal and serosal metastasis along the splenic flexure and descending colon. 2. Inguinal lymph nodes are slightly increased in size. Lymph nodes increased in size from 06/10/2020 and similar to CT of 03/25/2020. 3. No new peritoneal disease.  No free fluid     REVIEW OF SYSTEMS:   Constitutional: Denies fevers, chills or abnormal weight loss Eyes: Denies  blurriness of vision Ears, nose, mouth, throat, and face: Denies mucositis or sore throat Respiratory: Denies cough, dyspnea or  wheezes Cardiovascular: Denies palpitation, chest discomfort or lower extremity swelling Gastrointestinal:  Denies nausea, heartburn or change in bowel habits Lymphatics: Denies new lymphadenopathy or easy bruising Neurological:Denies numbness, tingling or new weaknesses Behavioral/Psych: Mood is stable, no new changes  All other systems were reviewed with the patient and are negative.  I have reviewed the past medical history, past surgical history, social history and family history with the patient and they are unchanged from previous note.  ALLERGIES:  has No Known Allergies.  MEDICATIONS:  Current Outpatient Medications  Medication Sig Dispense Refill  . niraparib tosylate (ZEJULA) 100 MG capsule Take 2 capsules (200 mg total) by mouth daily. May take at bedtime to reduce nausea and vomiting. 60 capsule 11  . acetaminophen (TYLENOL) 325 MG tablet Take 650 mg by mouth every 6 (six) hours as needed for moderate pain or headache.     Marland Kitchen apixaban (ELIQUIS) 5 MG TABS tablet Take 1 tablet (5 mg total) by mouth 2 (two) times daily. --Start around 08/14/20 after you complete your initial starter pack 60 tablet 5  . Apixaban Starter Pack, 64m and 5106m (ELIQUIS DVT/PE STARTER PACK) Take as directed on package: start with two-40m16mablets twice daily for 7 days. On day 8, switch to one-40mg38mblet twice daily. 1 each 0  . cholecalciferol (VITAMIN D3) 25 MCG (1000 UNIT) tablet Take 1,000 Units by mouth daily.    . leMarland Kitchenothyroxine (SYNTHROID) 75 MCG tablet Take 1 tablet (75 mcg total) by mouth daily before breakfast. 30 tablet 3  . lidocaine-prilocaine (EMLA) cream Apply to affected area once (Patient taking differently: Apply 1 application topically daily as needed (port access).) 30 g 3  . loratadine (CLARITIN) 10 MG tablet Take 10 mg by mouth daily.    . Melatonin 3 MG TABS Take by mouth.    . ondansetron (ZOFRAN) 8 MG tablet Take 8 mg by mouth every 8 (eight) hours as needed.    . pantoprazole  (PROTONIX) 40 MG tablet Take 1 tablet (40 mg total) by mouth daily. 30 tablet 4  . prochlorperazine (COMPAZINE) 10 MG tablet Take 10 mg by mouth every 6 (six) hours as needed.    . senna-docusate (SENOKOT-S) 8.6-50 MG tablet Take 2 tablets by mouth at bedtime. 60 tablet 3  . vitamin B-12 1000 MCG tablet Take 1 tablet (1,000 mcg total) by mouth daily. 30 tablet 3   No current facility-administered medications for this visit.    PHYSICAL EXAMINATION: ECOG PERFORMANCE STATUS: 1 - Symptomatic but completely ambulatory  Vitals:   09/10/20 0955  BP: (!) 133/47  Pulse: 85  Resp: 18  Temp: 98.1 F (36.7 C)  SpO2: 98%   Filed Weights   09/10/20 0955  Weight: 217 lb 9.6 oz (98.7 kg)    GENERAL:alert, no distress and comfortable NEURO: alert & oriented x 3 with fluent speech, no focal motor/sensory deficits  LABORATORY DATA:  I have reviewed the data as listed    Component Value Date/Time   NA 137 08/22/2020 0903   K 4.0 08/22/2020 0903   CL 104 08/22/2020 0903   CO2 23 08/22/2020 0903   GLUCOSE 115 (H) 08/22/2020 0903   BUN 18 08/22/2020 0903   CREATININE 1.20 (H) 08/22/2020 0903   CALCIUM 9.9 08/22/2020 0903   PROT 7.3 08/22/2020 0903   ALBUMIN 3.8 08/22/2020 0903   AST 19 08/22/2020 0903  ALT 17 08/22/2020 0903   ALKPHOS 88 08/22/2020 0903   BILITOT 0.3 08/22/2020 0903   GFRNONAA 47 (L) 08/22/2020 0903   GFRAA >60 03/25/2020 1144    No results found for: SPEP, UPEP  Lab Results  Component Value Date   WBC 3.9 (L) 08/22/2020   NEUTROABS 1.6 (L) 08/22/2020   HGB 11.5 (L) 08/22/2020   HCT 34.3 (L) 08/22/2020   MCV 111.0 (H) 08/22/2020   PLT 261 08/22/2020      Chemistry      Component Value Date/Time   NA 137 08/22/2020 0903   K 4.0 08/22/2020 0903   CL 104 08/22/2020 0903   CO2 23 08/22/2020 0903   BUN 18 08/22/2020 0903   CREATININE 1.20 (H) 08/22/2020 0903      Component Value Date/Time   CALCIUM 9.9 08/22/2020 0903   ALKPHOS 88 08/22/2020 0903    AST 19 08/22/2020 0903   ALT 17 08/22/2020 0903   BILITOT 0.3 08/22/2020 0903       RADIOGRAPHIC STUDIES: I have reviewed CT imaging with the patient  I have personally reviewed the radiological images as listed and agreed with the findings in the report. CT ABDOMEN PELVIS W CONTRAST  Result Date: 09/09/2020 CLINICAL DATA:  Ovarian cancer.  Assess treatment response. EXAM: CT ABDOMEN AND PELVIS WITH CONTRAST TECHNIQUE: Multidetector CT imaging of the abdomen and pelvis was performed using the standard protocol following bolus administration of intravenous contrast. CONTRAST:  172m OMNIPAQUE IOHEXOL 300 MG/ML  SOLN COMPARISON:  CT 06/10/2020 FINDINGS: Lower chest: Lung bases are clear. Hepatobiliary: No focal hepatic lesion. No biliary duct dilatation. Common bile duct is normal. Pancreas: Pancreas is normal. No ductal dilatation. No pancreatic inflammation. Spleen: Normal spleen Adrenals/urinary tract: Adrenal glands and kidneys are normal. The ureters and bladder normal. Stomach/Bowel: Lobulation along the posterior wall of the gastric cardia measuring 2.3 cm (image 14/series 2) is most consistent with a gastric diverticulum. Stomach, small bowel, appendix, and cecum are normal. The colon and rectosigmoid colon are normal. Vascular/Lymphatic: Abdominal aorta is normal caliber with atherosclerotic calcification. There is no retroperitoneal or periportal lymphadenopathy. No pelvic lymphadenopathy. Reproductive: Post hysterectomy and oophorectomy. Pessary device within the vagina. Other: Along the descending colon the serosal nodular thickening is decreased from prior. For example 9 mm nodular focus along the deep margin of the descending colon measures 9 mm compared to 11 mm. Nodule along the RIGHT pericolic gutter anterior to the psoas muscle measures 10 mm (image 54/2) compared to 10 mm. Nodularity previously seen in the transverse colon towards the splenic flexure is no longer present with now wispy  peritoneal thickening (image 29/2) Bilateral prominent inguinal nodes measure 13 mm each on image 71/2. Thisis increased from 10 mm on comparison exam 06/10/2020 similar to CT 06/24/2020. Musculoskeletal: No aggressive osseous lesion IMPRESSION: 1. Mild improvement in peritoneal and serosal metastasis along the splenic flexure and descending colon. 2. Inguinal lymph nodes are slightly increased in size. Lymph nodes increased in size from 06/10/2020 and similar to CT of 03/25/2020. 3. No new peritoneal disease.  No free fluid . Electronically Signed   By: SSuzy BouchardM.D.   On: 09/09/2020 09:53

## 2020-09-10 NOTE — Telephone Encounter (Signed)
Oral Oncology Patient Advocate Encounter  Received notification from [place] that prior authorization for Zejula is required.  PA submitted on CoverMyMeds Key [id number] Status is pending  Oral Oncology Clinic will continue to follow.  [place] Patient [place] Phone [phone] Fax [phone] 09/10/2020 2:04 PM

## 2020-09-10 NOTE — Assessment & Plan Note (Signed)
I reviewed the plan of care with the patient and her daughter over the phone and they are in agreement to proceed Recommend minimum 3 months of treatment before repeating imaging study

## 2020-09-10 NOTE — Assessment & Plan Note (Signed)
This is likely due to recent treatment with Doxil Observe closely and we will manage this conservatively

## 2020-09-10 NOTE — Assessment & Plan Note (Signed)
I have reviewed imaging studies with the patient and her daughter over the phone The patient has minimal residual disease noted on CT imaging She has significant difficulties tolerating chemotherapy despite multiple dose adjustment She had recent hand-foot syndrome from chemotherapy I recommend discontinuation of treatment and switching her over to maintenance therapy I prefers niraparib over bevacizumab given her history of recurrent blood clots  I have reviewed guidelines with the patient The goal of treatment is for maintenance treatment The following recommendation is based on NCCN guidelines from publication in [place] of Medicine.  Niraparib Maintenance Therapy in Platinum-Sensitive, Recurrent Ovarian Cancer M.R. Irineo Axon, Kevan Ny, J. Herrstedt, A.M. Rosey Bath, Lamar Blinks, [place]. Devin Going. Andre Lefort, D. Lorusso, ISheria Lang, N.E. [place], C. Marth, R. M?dry, R.D. Lennox Laity, J.S. Berek, A. Dorum, A.V. Tinker, A. [place], A. Gonzalez-Martin, P. Follana, B. Benigno, P. Lutricia Feil, L. Rosanna Randy, B.J. Rimel, Mickle Mallory, J.P. Solon Palm, and U.A. [place], for the ENGOT-OV16/NOVA Investigators [employer] [phone]. DOI: 10.1056/[id number]  Niraparib is an oral poly(adenosine diphosphate [ADP]-ribose) polymerase (PARP) 1/2 inhibitor that has shown clinical activity in patients with ovarian cancer. We sought to evaluate the efficacy of niraparib versus placebo as maintenance treatment for patients with platinum-sensitive, recurrent ovarian cancer. Methods In this randomized, double-blind, phase 3 trial, patients were categorized according to the presence or absence of a germline BRCA mutation (gBRCA cohort and non-gBRCA cohort) and the type of non-gBRCA mutation and were randomly assigned in a 2:1 ratio to receive niraparib (300 mg) or placebo once daily. The primary end point was progression-free survival. Results Of 553 enrolled patients, 203 were in the [facility]  cohort (with 138 assigned to niraparib and 65 to placebo), and 350 patients were in the non-gBRCA cohort (with 234 assigned to niraparib and 116 to placebo). Patients in the niraparib group had a significantly longer median duration of progression-free survival than did those in the placebo group, including 21.0 vs. 5.5 months in the [facility] cohort (hazard ratio, 0.27; 95% confidence interval [CI], 0.17 to 0.41), as compared with 12.9 months vs. 3.8 months in the non-gBRCA cohort for patients who had tumors with homologous recombination deficiency (HRD) (hazard ratio, 0.38; 95% CI, 0.24 to 0.59) and 9.3 months vs. 3.9 months in the overall non-gBRCA cohort (hazard ratio, 0.45; 95% CI, 0.34 to 0.61; P<0.001 for all three comparisons). The most common grade 3 or 4 adverse events that were reported in the niraparib group were thrombocytopenia (in 33.8%), anemia (in 25.3%), and neutropenia (in 19.6%), which were managed with dose modifications. Conclusions Among patients with platinum-sensitive, recurrent ovarian cancer, the median duration of progression-free survival was significantly longer among those receiving niraparib than among those receiving placebo, regardless of the presence or absence of gBRCA mutations or HRD status, with moderate bone marrow toxicity. (Funded by [employer].gov number, DVV61607371.)  The risks, benefits, side effects of treatment were fully discussed with the patient and she agreed to proceed I would prefer to start with lower dose for the patient given her history of severe pancytopenia I will get insurance prior approval and hopefully we can start her treatment within a week or 2 She is aware of frequent blood monitoring required within the first month of therapy

## 2020-09-12 ENCOUNTER — Telehealth: Payer: Self-pay

## 2020-09-12 NOTE — Telephone Encounter (Signed)
Called regarding Zejula. She will come in tomorrow and sign the paper work. Notified Elizabeth in oral chemo.

## 2020-09-13 ENCOUNTER — Telehealth: Payer: Self-pay

## 2020-09-13 NOTE — Telephone Encounter (Signed)
Oral Oncology Patient Advocate Encounter  Met patient in lobby to complete application for [employer] in an effort to reduce patient's out of pocket expense for Zejula to $0.    Application completed and faxed to [phone].   [place] patient assistance phone number for follow up is [phone]  This encounter will be updated until final determination.   [place] Patient [place] Phone [phone] Fax [phone] 09/13/2020 10:24 AM

## 2020-09-17 DIAGNOSIS — J301 Allergic rhinitis due to pollen: Secondary | ICD-10-CM | POA: Diagnosis not present

## 2020-09-17 NOTE — Telephone Encounter (Signed)
Patient is approved for Zejula at no cost from [place] 09/17/20-06/28/21.  Paula Peterson uses [facility] by [place] Patient [name] Phone [phone] Fax [phone] 09/17/2020 1:38 PM

## 2020-09-19 NOTE — Telephone Encounter (Signed)
Oral Chemotherapy Pharmacist Encounter  I spoke with patient today for overview of: Zejula (niraparib) for the maintenance treatment of recurrent ovarian  cancer with response to platinum-based chemotherapy, planned duration until disease progression or unacceptable toxicity.   Counseled patient on administration, dosing, side effects, monitoring, drug-food interactions, safe handling, storage, and disposal.  Patient will take Zejula 100mg  capsules, 2 capsules (200mg ) by mouth once daily, with a glass of water, without regard to food. Patient may take at bedtime to reduce nausea and vomiting.   Zejula start date: 09/21/20   Adverse effects include but are not limited to: nausea, vomiting, diarrhea, taste changes, mouth sores, fatigue, constipation, decreased blood counts, and joint pain. Myelodysplastic syndrome/acute myeloid leukemia (MDS/AML) have been reported (rarely) in clinical trials.  Patient updated about close blood count monitoring at the initiation of Zejula for detection of thrombocytopenia.  Patient has anti-emetic on hand and knows to take it if nausea develops.   Patient will obtain anti diarrheal and alert the office of 4 or more loose stools above baseline.    Reviewed with patient importance of keeping a medication schedule and plan for any missed doses. No barriers to medication adherence identified.  Medication reconciliation performed and medication/allergy list updated.  Insurance authorization for Paula Peterson has been obtained. Patient is approved to receive Zejula at no cost through [employer] assistance program. Medication will be delivered to patient's home on 09/19/20.   All questions answered.  Paula Peterson voiced understanding and appreciation.   Medication education handout placed in mail for patient. Patient knows to call the office with questions or concerns. Oral Chemotherapy Clinic phone number provided to patient.   Leron Croak, PharmD,  BCPS Hematology/Oncology Clinical Pharmacist [facility] [phone] 09/19/2020 1:04 PM

## 2020-09-20 DIAGNOSIS — H04123 Dry eye syndrome of bilateral lacrimal glands: Secondary | ICD-10-CM | POA: Diagnosis not present

## 2020-09-20 DIAGNOSIS — H40013 Open angle with borderline findings, low risk, bilateral: Secondary | ICD-10-CM | POA: Diagnosis not present

## 2020-09-20 DIAGNOSIS — H524 Presbyopia: Secondary | ICD-10-CM | POA: Diagnosis not present

## 2020-09-20 DIAGNOSIS — H26491 Other secondary cataract, right eye: Secondary | ICD-10-CM | POA: Diagnosis not present

## 2020-10-01 ENCOUNTER — Other Ambulatory Visit: Payer: Self-pay

## 2020-10-01 ENCOUNTER — Inpatient Hospital Stay: Payer: Medicare Other | Admitting: Hematology and Oncology

## 2020-10-01 ENCOUNTER — Inpatient Hospital Stay: Payer: Medicare Other | Attending: Gynecologic Oncology

## 2020-10-01 ENCOUNTER — Encounter: Payer: Self-pay | Admitting: Hematology and Oncology

## 2020-10-01 DIAGNOSIS — N183 Chronic kidney disease, stage 3 unspecified: Secondary | ICD-10-CM | POA: Diagnosis not present

## 2020-10-01 DIAGNOSIS — Z9221 Personal history of antineoplastic chemotherapy: Secondary | ICD-10-CM | POA: Insufficient documentation

## 2020-10-01 DIAGNOSIS — C786 Secondary malignant neoplasm of retroperitoneum and peritoneum: Secondary | ICD-10-CM | POA: Diagnosis not present

## 2020-10-01 DIAGNOSIS — I2699 Other pulmonary embolism without acute cor pulmonale: Secondary | ICD-10-CM | POA: Diagnosis not present

## 2020-10-01 DIAGNOSIS — Z7189 Other specified counseling: Secondary | ICD-10-CM

## 2020-10-01 DIAGNOSIS — C561 Malignant neoplasm of right ovary: Secondary | ICD-10-CM

## 2020-10-01 DIAGNOSIS — Z79899 Other long term (current) drug therapy: Secondary | ICD-10-CM | POA: Diagnosis not present

## 2020-10-01 DIAGNOSIS — Z86711 Personal history of pulmonary embolism: Secondary | ICD-10-CM | POA: Diagnosis not present

## 2020-10-01 DIAGNOSIS — Z90721 Acquired absence of ovaries, unilateral: Secondary | ICD-10-CM | POA: Insufficient documentation

## 2020-10-01 DIAGNOSIS — Z7901 Long term (current) use of anticoagulants: Secondary | ICD-10-CM | POA: Diagnosis not present

## 2020-10-01 LAB — CMP (CANCER CENTER ONLY)
ALT: 19 U/L (ref 0–44)
AST: 18 U/L (ref 15–41)
Albumin: 3.8 g/dL (ref 3.5–5.0)
Alkaline Phosphatase: 100 U/L (ref 38–126)
Anion gap: 16 — ABNORMAL HIGH (ref 5–15)
BUN: 15 mg/dL (ref 8–23)
CO2: 24 mmol/L (ref 22–32)
Calcium: 9.9 mg/dL (ref 8.9–10.3)
Chloride: 98 mmol/L (ref 98–111)
Creatinine: 1.28 mg/dL — ABNORMAL HIGH (ref 0.44–1.00)
GFR, Estimated: 43 mL/min — ABNORMAL LOW (ref >60)
Glucose, Bld: 111 mg/dL — ABNORMAL HIGH (ref 70–99)
Potassium: 4.5 mmol/L (ref 3.5–5.1)
Sodium: 138 mmol/L (ref 135–145)
Total Bilirubin: 0.3 mg/dL (ref 0.3–1.2)
Total Protein: 7.3 g/dL (ref 6.5–8.1)

## 2020-10-01 LAB — CBC WITH DIFFERENTIAL (CANCER CENTER ONLY)
Abs Immature Granulocytes: 0.02 10*3/uL (ref 0.00–0.07)
Basophils Absolute: 0 10*3/uL (ref 0.0–0.1)
Basophils Relative: 0 %
Eosinophils Absolute: 0 10*3/uL (ref 0.0–0.5)
Eosinophils Relative: 1 %
HCT: 38.7 % (ref 36.0–46.0)
Hemoglobin: 13.2 g/dL (ref 12.0–15.0)
Immature Granulocytes: 0 %
Lymphocytes Relative: 25 %
Lymphs Abs: 1.8 10*3/uL (ref 0.7–4.0)
MCH: 35.8 pg — ABNORMAL HIGH (ref 26.0–34.0)
MCHC: 34.1 g/dL (ref 30.0–36.0)
MCV: 104.9 fL — ABNORMAL HIGH (ref 80.0–100.0)
Monocytes Absolute: 0.8 10*3/uL (ref 0.1–1.0)
Monocytes Relative: 11 %
Neutro Abs: 4.5 10*3/uL (ref 1.7–7.7)
Neutrophils Relative %: 63 %
Platelet Count: 200 10*3/uL (ref 150–400)
RBC: 3.69 MIL/uL — ABNORMAL LOW (ref 3.87–5.11)
RDW: 13.8 % (ref 11.5–15.5)
WBC Count: 7.3 10*3/uL (ref 4.0–10.5)
nRBC: 0 % (ref 0.0–0.2)

## 2020-10-01 NOTE — Assessment & Plan Note (Signed)
She had recurrent PE despite improved disease control on recent imaging She will continue anticoagulation indefinitely She denies recent bleeding complication

## 2020-10-01 NOTE — Assessment & Plan Note (Signed)
So far, she tolerated niraparib well without major side effects We will continue close monitoring of her blood counts while she is on treatment Plan to see her in about 10 days for further follow-up

## 2020-10-01 NOTE — Progress Notes (Signed)
[facility] OFFICE PROGRESS NOTE  Patient Care Team: Caryl Bis, MD as PCP - General (Family Medicine) Awanda Mink Craige Cotta, RN as Oncology Nurse Navigator (Oncology)  ASSESSMENT & PLAN:  Right ovarian epithelial cancer [facility]) So far, she tolerated niraparib well without major side effects We will continue close monitoring of her blood counts while she is on treatment Plan to see her in about 10 days for further follow-up  PE (pulmonary thromboembolism) ([place]) She had recurrent PE despite improved disease control on recent imaging She will continue anticoagulation indefinitely She denies recent bleeding complication  CKD (chronic kidney disease), stage III ([place]) Niraparib can cause elevated serum creatinine So far, her creatinine function is stable Monitor closely for now   No orders of the defined types were placed in this encounter.   All questions were answered. The patient knows to call the clinic with any problems, questions or concerns. The total time spent in the appointment was 20 minutes encounter with patients including review of chart and various tests results, discussions about plan of care and coordination of care plan   [name], MD 10/01/2020 12:34 PM  INTERVAL HISTORY: Please see below for problem oriented charting. 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  She has bloating and early satiety. Shortness of breath with walking. She denied bleeding. She reported constipation with pain with defecation and narrowed stools   11/29/2018 Imaging   1.  13 cm complex cystic lesion in the central pelvis, highly suspicious for ovarian cystadenocarcinoma. 2. Diffuse peritoneal carcinomatosis with mild ascites. 3. Mild lymphadenopathy in porta hepatis and right cardiophrenic angle, suspicious for metastatic disease. 4. Moderate right and tiny left pleural effusions   12/05/2018 Tumor Marker   Patient's tumor was tested for the following markers: CA-125 Results of the tumor marker test revealed 1015.   12/06/2018 Cancer Staging   Staging form: Ovary, Fallopian Tube, and Primary Peritoneal Carcinoma, AJCC 8th Edition - Clinical: FIGO Stage IVA, calculated as Stage IV (cT3c, cN1, cM1) - Signed by [name], MD on 12/06/2018   12/09/2018 Pathology Results   PLEURAL FLUID, RIGHT (SPECIMEN 1 OF 1 COLLECTED 12/09/18): - MALIGNANT CELLS CONSISTENT WITH METASTATIC ADENOCARCINOMA - SEE COMMENT Comment The neoplastic cells are positive for cytokeratin 7 and Pax-8 but negative for cytokeratin 20, TTF-1, CDX-2 and Gata-3. Overall, the phenotype is consistent with the clinical impression of gynecologic primary.    12/15/2018 Procedure   Placement of single lumen port a cath via right internal jugular vein. The catheter tip lies at the cavo-atrial junction. A power injectable port a cath was placed and is ready for immediate use   12/16/2018 - 08/24/2019 Chemotherapy   The patient had carboplatin and taxol for chemotherapy treatment.  She had 7 cycles given neoadjuvant prior to surgery and 3 more cycles after surgery, for a total of 10 cycles of treatment    01/06/2019 Tumor Marker   Patient's tumor was tested for the following markers: CA-125 Results of the tumor marker test revealed 948   02/02/2019 Imaging   1. Massive pulmonary embolism, as discussed above. Given the mildly elevated RV to  LV ratio of 0.95, this is associated with increased risk of morbidity and mortality. 2. Today's study demonstrates a mixed response to therapy. Specifically, while there has  been regression of the bulky intraperitoneal metastatic disease and regression of previously noted pleural effusions, the large cystic mass in the central pelvis has increased in size compared to the prior study. 3. New right mild hydroureteronephrosis related to extrinsic compression on the distal third of the right ureter by the patient's large pelvic mass. 4. Scattered small pulmonary nodules (predominantly pleural based) appear stable compared to prior examinations. These are nonspecific but warrant continued attention on follow-up studies. 5. Aortic atherosclerosis, in addition to three-vessel coronary artery disease. Assessment for potential risk factor modification, dietary therapy or pharmacologic therapy may be warranted, if clinically indicated.   02/02/2019 - 02/04/2019 Hospital Admission   She was admitted the hospital due to DVT and PE   02/03/2019 Imaging   Bilateral venous Doppler US Right: Findings consistent with acute deep vein thrombosis involving the right femoral vein, right popliteal vein, right peroneal veins, right soleal veins, and right gastrocnemius veins. No cystic structure found in the popliteal fossa. Left: There is no evidence of deep vein thrombosis in the lower extremity. No cystic structure found in the popliteal fossa   02/17/2019 Tumor Marker   Patient's tumor was tested for the following markers: CA-125 Results of the tumor marker test revealed 127   03/10/2019 Tumor Marker   Patient's tumor was tested for the following markers: CA-125. Results of the tumor marker test revealed 87.4   03/14/2019 - 03/16/2019 Hospital Admission   She was admitted to the hospital recently for weakness   03/31/2019 Tumor Marker   Patient's tumor was tested for the following markers: CA-125 Results of the tumor marker test revealed 59.2.   04/28/2019 Tumor Marker   Patient's tumor was tested for the following markers: CA-125 Results of the tumor marker test revealed 56.8    05/05/2019 Imaging   1. Interval decrease in size of the large cystic mass in the central pelvis. Mesenteric and omental soft tissue disease shows no substantial interval change. 2. The mild right hydroureteronephrosis seen previously has resolved in the interval. 3. Small residual nonobstructive thrombus identified in the inter lobar pulmonary artery common here into the lateral wall compatible with chronicity. 4. 14 mm subtle enhancing lesion in the anterior right liver is stable. This may be vascular malformation. Attention on follow-up recommended. 5. Stable appearance of the multiple small bilateral pulmonary nodules. Continued attention on follow-up recommended. 6.  Aortic Atherosclerois (ICD10-170.0)     06/06/2019 Pathology Results   A. OMENTUM, RESECTION: - Metastatic carcinoma. B. RIGHT FALLOPIAN TUBE AND OVARY, SALPINGOOOPHORECTOMY: - High-grade serous carcinoma, spanning 9 cm. - No surface involvement identified. - Fallopian tube involved by carcinoma. - See oncology table. C. PERITONEAL NODULE, EXCISION: - Metastatic carcinoma. ONCOLOGY TABLE: OVARY or FALLOPIAN TUBE or PRIMARY PERITONEUM: Procedure: Right salpingo-oophorectomy, omental resection, and peritoneal biopsy. Specimen Integrity: Intact Tumor Site: Right ovary Ovarian Surface Involvement (required only if applicable): Not identified Tumor Size: 9 cm Histologic Type: High-grade serous carcinoma Histologic Grade: High-grade Implants (required for advanced stage serous/seromucinous borderline tumors only): Omentum, peritoneum. Other Tissue/ Organ Involvement: Right fallopian tube Largest Extrapelvic Peritoneal Focus (required only if applicable): 7.3 cm Peritoneal/Ascitic Fluid: Positive pleural fluid pre neoadjuvant therapy. Treatment Effect (required only for high-grade serous carcinomas): Probable treatment effect present. Regional Lymph Nodes: No lymph nodes submitted or found Pathologic Stage Classification  (pTNM, AJCC 8th Edition):  ypT3c, ypNX Representative Tumor Block: B2   06/06/2019 Surgery   Preoperative Diagnosis: stage IV ovarian cancer, s/p neoadjuvant chemotherapy, history of recent PE.  Procedure(s) Performed: Exploratory laparotomy with right salpingo-oophorectomy, omentectomy radical tumor debulking for ovarian cancer .   Surgeon: Thereasa Solo, MD.    Operative Findings:  Omental cake adherent to anterior abdominal wall and hepatic flexure. 10cm right tube and ovary. Surgically absent uterus and left tube and ovary. Granular nodularity across right diaphragm.    This represented an optimal cytoreduction (R0) with no gross visible disease remaining.    07/06/2019 Tumor Marker   Patient's tumor was tested for the following markers:CA-125 Results of the tumor marker test revealed 16.4   08/10/2019 Genetic Testing   Negative genetic testing. No pathogenic variants identified. VUS in BRCA2 called [id number] identified on the [facility] CancerNext+RNAinsight panel. The report date is 08/10/2019.  The CancerNext+RNAinsight gene panel offered by [employer] includes sequencing and rearrangement analysis for the following 36 genes: APC*, ATM*, AXIN2, BARD1, BMPR1A, BRCA1*, BRCA2*, BRIP1*, CDH1*, CDK4, CDKN2A, CHEK2*, DICER1, MLH1*, MSH2*, MSH3, MSH6*, MUTYH*, NBN, NF1*, NTHL1, PALB2*, PMS2*, PTEN*, RAD51C*, RAD51D*, RECQL, SMAD4, SMARCA4, STK11 and TP53* (sequencing and deletion/duplication); HOXB13, POLD1 and POLE (sequencing only); EPCAM and GREM1 (deletion/duplication only). DNA and RNA analyses performed for * genes.   HRD (somatic) testing was initially ordered and was not completed due to insufficient amount of tumor sample.    08/24/2019 Tumor Marker   Patient's tumor was tested for the following markers: CA-125 Results of the tumor marker test revealed 12.2   09/20/2019 Imaging   1. Interval debulking, bilateral salpingo oophorectomy and omentectomy with resection of the dominant pelvic  cystic lesion and the bandlike soft tissue seen previously in the right mesentery/omentum. Today's study is status shows new postoperative baseline for follow-up. 2. 6 mm gastrohepatic ligament nodule seen on the previous study is stable. Continued attention on follow-up recommended. 3. No new or progressive findings in the chest, abdomen, or pelvis to suggest disease progression. 4. Stable 1.4 cm focus of homogeneous enhancement in the anterior right liver. Stable since at least 11/29/2018 and likely benign. Continued attention on follow-up recommended. 5. Aortic Atherosclerosis (ICD10-I70.0).   09/20/2019 Tumor Marker   Patient's tumor was tested for the following markers: CA-125 Results of the tumor marker test revealed 11.9   12/26/2019 Tumor Marker   Patient's tumor was tested for the following markers: CA-125 Results of the tumor marker test revealed 16.5   03/25/2020 Imaging   1. Interval progression of peritoneal disease which predominantly involves the serosal surface of the proximal and distal transverse colon, and descending colon. 2. New bilateral inguinal adenopathy. 3. No ascites. 4. Aortic atherosclerosis.   03/25/2020 Tumor Marker   Patient's tumor was tested for the following markers: CA-125 Results of the tumor marker test revealed 249   03/28/2020 Echocardiogram    1. Borderline LV Strain; A2C view represents the most accurate acquisition.. Left ventricular ejection fraction, by estimation, is 60 to 65%. Left ventricular ejection fraction by PLAX is 63 %. The left ventricle has normal function. The left ventricle demonstrates regional wall motion abnormalities (see scoring diagram/findings for description). Left ventricular diastolic parameters were normal.  2. Right ventricular systolic function is normal. The right ventricular size is normal.  3. Left atrial size was mildly dilated.  4. The mitral valve is normal in structure. No evidence of mitral valve regurgitation.   5. The aortic valve is grossly normal. Aortic valve regurgitation is mild.  6. The inferior vena cava is normal in size with greater than 50% respiratory variability, suggesting right atrial pressure of 3 mmHg.   04/02/2020 Tumor Marker   Patient's tumor was tested for the following markers: CA-125 Results of the tumor marker test revealed 304   04/02/2020 - 08/22/2020 Chemotherapy   The patient had carboplatin and Doxil for chemotherapy treatment.     05/27/2020 Tumor Marker   Patient's tumor was tested for the following markers: CA-125. Results of the tumor marker test revealed 42.7   06/10/2020 Imaging   Improving peritoneal disease and serosal implants along the left colon, as above.   Improving bilateral inguinal nodes.   06/18/2020 Echocardiogram    1. Left ventricular ejection fraction, by estimation, is 60 to 65%. The left ventricle has normal function. The left ventricle has no regional wall motion abnormalities. There is mild left ventricular hypertrophy of the basal-septal segment. Left ventricular diastolic parameters were normal. The average left ventricular global longitudinal strain is -25.0 %. The global longitudinal strain is normal.  2. Right ventricular systolic function is normal. The right ventricular size is normal.  3. The mitral valve is grossly normal. Mild mitral valve regurgitation.  4. Tricuspid valve regurgitation is moderate.  5. The aortic valve is tricuspid. There is mild calcification of the aortic valve. There is mild thickening of the aortic valve. Aortic valve regurgitation is mild to moderate.  6. The inferior vena cava is normal in size with <50% respiratory variability, suggesting right atrial pressure of 8 mmHg.   06/25/2020 Tumor Marker   Patient's tumor was tested for the following markers: CA-125 Results of the tumor marker test revealed 23.9   07/15/2020 Imaging   IMPRESSION: 1. Mild amount of acute pulmonary embolism within multiple middle  lobe and lower lobe branches of the right pulmonary artery. 2.   Small amount of chronic right pulmonary embolism. 3.   Mild posterior left basilar atelectasis and/or infiltrate. 4.   Small left pleural effusion. 5. Aortic atherosclerosis.   Aortic Atherosclerosis (ICD10-I70.0).     07/15/2020 - 07/17/2020 Hospital Admission   She was admitted to a local hospital after presentation with chest pain and shortness of breath and was found to have significant pulmonary embolism She was anticoagulated and discharged   07/16/2020 Echocardiogram   1. Left ventricular ejection fraction, by estimation, is 60 to 65%. The left ventricle has normal function. The left ventricle has no regional wall motion abnormalities. There is mild left ventricular hypertrophy.  Left ventricular diastolic parameters are indeterminate.   2. Right ventricular systolic function is normal. The right ventricular size is normal. There is normal pulmonary artery systolic pressure. The estimated right ventricular systolic pressure is 47.0 mmHg.   3. The mitral valve is grossly normal. Trivial mitral valve regurgitation.   4. The aortic valve is tricuspid. Aortic valve regurgitation is mild.   5. The inferior vena cava is normal in size with greater than 50% respiratory variability, suggesting right atrial pressure of 3 mmHg.    07/25/2020 Tumor Marker   Patient's tumor was tested for the following markers: CA-125 Results of the tumor marker test revealed 36.4   09/09/2020 Imaging   1. Mild improvement in peritoneal and serosal metastasis along the splenic flexure and descending colon. 2. Inguinal lymph nodes are slightly increased in size. Lymph nodes increased in size from 06/10/2020 and similar to CT of 03/25/2020. 3. No new peritoneal disease.  No free fluid   09/21/2020 -  Chemotherapy  She started taking Niraparib         REVIEW OF SYSTEMS:   Constitutional: Denies fevers, chills or abnormal weight loss Eyes:  Denies blurriness of vision Ears, nose, mouth, throat, and face: Denies mucositis or sore throat Respiratory: Denies cough, dyspnea or wheezes Cardiovascular: Denies palpitation, chest discomfort or lower extremity swelling Gastrointestinal:  Denies nausea, heartburn or change in bowel habits Skin: Denies abnormal skin rashes Lymphatics: Denies new lymphadenopathy or easy bruising Neurological:Denies numbness, tingling or new weaknesses Behavioral/Psych: Mood is stable, no new changes  All other systems were reviewed with the patient and are negative.  I have reviewed the past medical history, past surgical history, social history and family history with the patient and they are unchanged from previous note.  ALLERGIES:  has No Known Allergies.  MEDICATIONS:  Current Outpatient Medications  Medication Sig Dispense Refill  . acetaminophen (TYLENOL) 325 MG tablet Take 650 mg by mouth every 6 (six) hours as needed for moderate pain or headache.     Marland Kitchen apixaban (ELIQUIS) 5 MG TABS tablet Take 1 tablet (5 mg total) by mouth 2 (two) times daily. --Start around 08/14/20 after you complete your initial starter pack 60 tablet 5  . Apixaban Starter Pack, 77m and 513m (ELIQUIS DVT/PE STARTER PACK) Take as directed on package: start with two-61m62mablets twice daily for 7 days. On day 8, switch to one-61mg44mblet twice daily. 1 each 0  . cholecalciferol (VITAMIN D3) 25 MCG (1000 UNIT) tablet Take 1,000 Units by mouth daily.    . leMarland Kitchenothyroxine (SYNTHROID) 75 MCG tablet Take 1 tablet (75 mcg total) by mouth daily before breakfast. 30 tablet 3  . lidocaine-prilocaine (EMLA) cream Apply to affected area once (Patient taking differently: Apply 1 application topically daily as needed (port access).) 30 g 3  . loratadine (CLARITIN) 10 MG tablet Take 10 mg by mouth daily.    . Melatonin 3 MG TABS Take by mouth.    . niraparib tosylate (ZEJULA) 100 MG capsule TAKE 2 CAPSULES (200 MG TOTAL) BY MOUTH DAILY. MAY  TAKE AT BEDTIME TO REDUCE NAUSEA AND VOMITING. 60 capsule 11  . ondansetron (ZOFRAN) 8 MG tablet Take 8 mg by mouth every 8 (eight) hours as needed.    . pantoprazole (PROTONIX) 40 MG tablet Take 1 tablet (40 mg total) by mouth daily. 30 tablet 4  . prochlorperazine (COMPAZINE) 10 MG tablet Take 10 mg by mouth every 6 (six) hours as needed.    . senna-docusate (SENOKOT-S) 8.6-50 MG tablet Take 2 tablets by mouth at bedtime. 60 tablet 3  . vitamin B-12 1000 MCG tablet Take 1 tablet (1,000 mcg total) by mouth daily. 30 tablet 3   No current facility-administered medications for this visit.    PHYSICAL EXAMINATION: ECOG PERFORMANCE STATUS: 1 - Symptomatic but completely ambulatory  Vitals:   10/01/20 1055  BP: (!) 141/69  Pulse: 95  Resp: 19  Temp: 98 F (36.7 C)  SpO2: 100%   Filed Weights   10/01/20 1055  Weight: 215 lb 9.6 oz (97.8 kg)    GENERAL:alert, no distress and comfortable SKIN: skin color, texture, turgor are normal, no rashes or significant lesions EYES: normal, Conjunctiva are pink and non-injected, sclera clear OROPHARYNX:no exudate, no erythema and lips, buccal mucosa, and tongue normal  NECK: supple, thyroid normal size, non-tender, without nodularity LYMPH:  no palpable lymphadenopathy in the cervical, axillary or inguinal LUNGS: clear to auscultation and percussion with normal breathing effort HEART: regular rate & rhythm and  no murmurs and no lower extremity edema ABDOMEN:abdomen soft, non-tender and normal bowel sounds Musculoskeletal:no cyanosis of digits and no clubbing  NEURO: alert & oriented x 3 with fluent speech, no focal motor/sensory deficits  LABORATORY DATA:  I have reviewed the data as listed    Component Value Date/Time   NA 138 10/01/2020 1037   K 4.5 10/01/2020 1037   CL 98 10/01/2020 1037   CO2 24 10/01/2020 1037   GLUCOSE 111 (H) 10/01/2020 1037   BUN 15 10/01/2020 1037   CREATININE 1.28 (H) 10/01/2020 1037   CALCIUM 9.9 10/01/2020  1037   PROT 7.3 10/01/2020 1037   ALBUMIN 3.8 10/01/2020 1037   AST 18 10/01/2020 1037   ALT 19 10/01/2020 1037   ALKPHOS 100 10/01/2020 1037   BILITOT 0.3 10/01/2020 1037   GFRNONAA 43 (L) 10/01/2020 1037   GFRAA >60 03/25/2020 1144    No results found for: SPEP, UPEP  Lab Results  Component Value Date   WBC 7.3 10/01/2020   NEUTROABS 4.5 10/01/2020   HGB 13.2 10/01/2020   HCT 38.7 10/01/2020   MCV 104.9 (H) 10/01/2020   PLT 200 10/01/2020      Chemistry      Component Value Date/Time   NA 138 10/01/2020 1037   K 4.5 10/01/2020 1037   CL 98 10/01/2020 1037   CO2 24 10/01/2020 1037   BUN 15 10/01/2020 1037   CREATININE 1.28 (H) 10/01/2020 1037      Component Value Date/Time   CALCIUM 9.9 10/01/2020 1037   ALKPHOS 100 10/01/2020 1037   AST 18 10/01/2020 1037   ALT 19 10/01/2020 1037   BILITOT 0.3 10/01/2020 1037

## 2020-10-01 NOTE — Assessment & Plan Note (Signed)
Niraparib can cause elevated serum creatinine So far, her creatinine function is stable Monitor closely for now

## 2020-10-02 LAB — CA 125: Cancer Antigen (CA) 125: 83.4 U/mL — ABNORMAL HIGH (ref 0.0–38.1)

## 2020-10-05 DIAGNOSIS — J0100 Acute maxillary sinusitis, unspecified: Secondary | ICD-10-CM | POA: Diagnosis not present

## 2020-10-08 DIAGNOSIS — H26491 Other secondary cataract, right eye: Secondary | ICD-10-CM | POA: Diagnosis not present

## 2020-10-10 DIAGNOSIS — R300 Dysuria: Secondary | ICD-10-CM | POA: Diagnosis not present

## 2020-10-10 DIAGNOSIS — J019 Acute sinusitis, unspecified: Secondary | ICD-10-CM | POA: Diagnosis not present

## 2020-10-14 ENCOUNTER — Inpatient Hospital Stay: Payer: Medicare Other

## 2020-10-14 ENCOUNTER — Other Ambulatory Visit: Payer: Self-pay

## 2020-10-14 ENCOUNTER — Encounter: Payer: Self-pay | Admitting: Hematology and Oncology

## 2020-10-14 ENCOUNTER — Inpatient Hospital Stay: Payer: Medicare Other | Admitting: Hematology and Oncology

## 2020-10-14 VITALS — BP 159/80 | HR 78 | Temp 97.5°F | Resp 18 | Ht 67.0 in | Wt 215.4 lb

## 2020-10-14 DIAGNOSIS — Z9221 Personal history of antineoplastic chemotherapy: Secondary | ICD-10-CM | POA: Diagnosis not present

## 2020-10-14 DIAGNOSIS — C786 Secondary malignant neoplasm of retroperitoneum and peritoneum: Secondary | ICD-10-CM | POA: Diagnosis not present

## 2020-10-14 DIAGNOSIS — Z86711 Personal history of pulmonary embolism: Secondary | ICD-10-CM | POA: Diagnosis not present

## 2020-10-14 DIAGNOSIS — Z7901 Long term (current) use of anticoagulants: Secondary | ICD-10-CM | POA: Diagnosis not present

## 2020-10-14 DIAGNOSIS — C561 Malignant neoplasm of right ovary: Secondary | ICD-10-CM

## 2020-10-14 DIAGNOSIS — D61818 Other pancytopenia: Secondary | ICD-10-CM | POA: Diagnosis not present

## 2020-10-14 DIAGNOSIS — I10 Essential (primary) hypertension: Secondary | ICD-10-CM | POA: Diagnosis not present

## 2020-10-14 DIAGNOSIS — N183 Chronic kidney disease, stage 3 unspecified: Secondary | ICD-10-CM | POA: Diagnosis not present

## 2020-10-14 DIAGNOSIS — R519 Headache, unspecified: Secondary | ICD-10-CM

## 2020-10-14 DIAGNOSIS — Z7189 Other specified counseling: Secondary | ICD-10-CM

## 2020-10-14 DIAGNOSIS — Z79899 Other long term (current) drug therapy: Secondary | ICD-10-CM | POA: Diagnosis not present

## 2020-10-14 LAB — CMP (CANCER CENTER ONLY)
ALT: 24 U/L (ref 0–44)
AST: 23 U/L (ref 15–41)
Albumin: 3.6 g/dL (ref 3.5–5.0)
Alkaline Phosphatase: 114 U/L (ref 38–126)
Anion gap: 10 (ref 5–15)
BUN: 13 mg/dL (ref 8–23)
CO2: 27 mmol/L (ref 22–32)
Calcium: 10 mg/dL (ref 8.9–10.3)
Chloride: 102 mmol/L (ref 98–111)
Creatinine: 1.03 mg/dL — ABNORMAL HIGH (ref 0.44–1.00)
GFR, Estimated: 56 mL/min — ABNORMAL LOW (ref >60)
Glucose, Bld: 91 mg/dL (ref 70–99)
Potassium: 4 mmol/L (ref 3.5–5.1)
Sodium: 139 mmol/L (ref 135–145)
Total Bilirubin: 0.3 mg/dL (ref 0.3–1.2)
Total Protein: 6.9 g/dL (ref 6.5–8.1)

## 2020-10-14 LAB — CBC WITH DIFFERENTIAL (CANCER CENTER ONLY)
Abs Immature Granulocytes: 0 10*3/uL (ref 0.00–0.07)
Basophils Absolute: 0 10*3/uL (ref 0.0–0.1)
Basophils Relative: 1 %
Eosinophils Absolute: 0.1 10*3/uL (ref 0.0–0.5)
Eosinophils Relative: 1 %
HCT: 36 % (ref 36.0–46.0)
Hemoglobin: 12 g/dL (ref 12.0–15.0)
Immature Granulocytes: 0 %
Lymphocytes Relative: 38 %
Lymphs Abs: 1.5 10*3/uL (ref 0.7–4.0)
MCH: 34.8 pg — ABNORMAL HIGH (ref 26.0–34.0)
MCHC: 33.3 g/dL (ref 30.0–36.0)
MCV: 104.3 fL — ABNORMAL HIGH (ref 80.0–100.0)
Monocytes Absolute: 0.5 10*3/uL (ref 0.1–1.0)
Monocytes Relative: 12 %
Neutro Abs: 1.9 10*3/uL (ref 1.7–7.7)
Neutrophils Relative %: 48 %
Platelet Count: 144 10*3/uL — ABNORMAL LOW (ref 150–400)
RBC: 3.45 MIL/uL — ABNORMAL LOW (ref 3.87–5.11)
RDW: 13.9 % (ref 11.5–15.5)
WBC Count: 3.8 10*3/uL — ABNORMAL LOW (ref 4.0–10.5)
nRBC: 0 % (ref 0.0–0.2)

## 2020-10-14 MED ORDER — PEGFILGRASTIM-CBQV 6 MG/0.6ML ~~LOC~~ SOSY
PREFILLED_SYRINGE | SUBCUTANEOUS | Status: AC
  Filled 2020-10-14: qty 0.6

## 2020-10-14 MED ORDER — HEPARIN SOD (PORK) LOCK FLUSH 100 UNIT/ML IV SOLN
250.0000 [IU] | Freq: Once | INTRAVENOUS | Status: DC
  Filled 2020-10-14: qty 5

## 2020-10-14 MED ORDER — HEPARIN SOD (PORK) LOCK FLUSH 100 UNIT/ML IV SOLN
500.0000 [IU] | Freq: Once | INTRAVENOUS | Status: AC
  Administered 2020-10-14: 500 [IU]
  Filled 2020-10-14: qty 5

## 2020-10-14 MED ORDER — ATENOLOL 25 MG PO TABS: 25.0000 mg | ORAL_TABLET | Freq: Every day | ORAL | 1 refills | Status: DC

## 2020-10-14 MED ORDER — SODIUM CHLORIDE 0.9% FLUSH
10.0000 mL | Freq: Once | INTRAVENOUS | Status: AC
  Administered 2020-10-14: 10 mL
  Filled 2020-10-14: qty 10

## 2020-10-14 NOTE — Assessment & Plan Note (Signed)
She has uncontrolled hypertension and I think this likely contributed to headaches I recommend starting her on low-dose atenolol and check her blood pressure twice daily I will reassess next week

## 2020-10-14 NOTE — Patient Instructions (Signed)

## 2020-10-14 NOTE — Assessment & Plan Note (Signed)
She has multiple different complaints which I do not believe are directly related to side effects of niraparib Given her ongoing UTI, I recommend her to hold niraparib today and tomorrow and to restart on Wednesday I also recommend starting her on blood pressure medication for uncontrolled hypertension which I believe contributed to her headache I will see her again next week for further follow-up

## 2020-10-14 NOTE — Assessment & Plan Note (Signed)
The cause of her acquired pancytopenia is multifactorial She will hold niraparib for 2 days and I plan to resume next week if her pancytopenia improves

## 2020-10-14 NOTE — Assessment & Plan Note (Signed)
This is multifactorial Recommend conservative approach I told the patient this is not caused by her chemotherapy

## 2020-10-14 NOTE — Progress Notes (Signed)
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[facility]) She has multiple different complaints which I do not believe are directly related to side effects of niraparib Given her ongoing UTI, I recommend her to hold niraparib today and tomorrow and to restart on Wednesday I also recommend starting her on blood pressure medication for uncontrolled hypertension which I believe contributed to her headache I will see her again next week for further follow-up  Pancytopenia, acquired ([place]) The cause of her acquired pancytopenia is multifactorial She will hold niraparib for 2 days and I plan to resume next week if her pancytopenia improves  Essential hypertension She has uncontrolled hypertension and I think this likely contributed to headaches I recommend starting her on low-dose atenolol and check her blood pressure twice daily I will reassess next week  Headache disorder This is multifactorial Recommend conservative approach I told the patient this is not caused by her chemotherapy   No orders of the defined types were placed in this encounter.   All questions were answered. The patient knows to call the clinic with any problems, questions or concerns. The total time spent in the appointment was 20 minutes encounter with patients including review of chart and various tests results, discussions about plan of care and coordination of care plan   [name], MD 10/14/2020 11:25 AM  INTERVAL HISTORY: Please see below for problem oriented charting. She returns for further follow-up She saw her primary care doctor last week on Thursday due to multiple symptoms including headache, dysuria and feeling unwell She was started on antibiotics for UTI and she was told she have fluid in her ears She has noted her  blood pressure at home has been high intermittently Systolic blood pressure has been as high as 170 The patient denies any recent signs or symptoms of bleeding such as spontaneous epistaxis, hematuria or hematochezia.   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  She has bloating and early satiety. Shortness of breath with walking. She denied bleeding. She reported constipation with pain with defecation and narrowed stools   11/29/2018 Imaging   1. 13 cm complex cystic lesion in the central pelvis, highly suspicious for ovarian cystadenocarcinoma. 2. Diffuse peritoneal carcinomatosis with mild ascites. 3. Mild lymphadenopathy in porta hepatis and right cardiophrenic angle, suspicious for metastatic disease. 4. Moderate right and tiny left pleural effusions   12/05/2018 Tumor Marker   Patient's tumor was tested for the following markers: CA-125 Results of the tumor marker test revealed 1015.   12/06/2018 Cancer Staging   Staging form: Ovary, Fallopian Tube, and Primary Peritoneal Carcinoma, AJCC 8th Edition - Clinical: FIGO Stage IVA, calculated as Stage IV (cT3c, cN1, cM1) - Signed by [name], MD on 12/06/2018   12/09/2018 Pathology Results   PLEURAL FLUID, RIGHT (SPECIMEN 1 OF 1 COLLECTED 12/09/18): - MALIGNANT CELLS CONSISTENT WITH METASTATIC ADENOCARCINOMA - SEE COMMENT Comment The neoplastic cells are positive for cytokeratin 7 and Pax-8 but negative for cytokeratin 20, TTF-1, CDX-2 and Gata-3. Overall, the phenotype is consistent with the clinical impression of gynecologic primary.    12/15/2018 Procedure   Placement of single lumen port a cath via right internal jugular  vein. The catheter tip lies at the cavo-atrial junction. A power injectable port a cath was placed and is ready for immediate use   12/16/2018 - 08/24/2019  Chemotherapy   The patient had carboplatin and taxol for chemotherapy treatment.  She had 7 cycles given neoadjuvant prior to surgery and 3 more cycles after surgery, for a total of 10 cycles of treatment    01/06/2019 Tumor Marker   Patient's tumor was tested for the following markers: CA-125 Results of the tumor marker test revealed 948   02/02/2019 Imaging   1. Massive pulmonary embolism, as discussed above. Given the mildly elevated RV to LV ratio of 0.95, this is associated with increased risk of morbidity and mortality. 2. Today's study demonstrates a mixed response to therapy. Specifically, while there has been regression of the bulky intraperitoneal metastatic disease and regression of previously noted pleural effusions, the large cystic mass in the central pelvis has increased in size compared to the prior study. 3. New right mild hydroureteronephrosis related to extrinsic compression on the distal third of the right ureter by the patient's large pelvic mass. 4. Scattered small pulmonary nodules (predominantly pleural based) appear stable compared to prior examinations. These are nonspecific but warrant continued attention on follow-up studies. 5. Aortic atherosclerosis, in addition to three-vessel coronary artery disease. Assessment for potential risk factor modification, dietary therapy or pharmacologic therapy may be warranted, if clinically indicated.   02/02/2019 - 02/04/2019 Hospital Admission   She was admitted the hospital due to DVT and PE   02/03/2019 Imaging   Bilateral venous Doppler US Right: Findings consistent with acute deep vein thrombosis involving the right femoral vein, right popliteal vein, right peroneal veins, right soleal veins, and right gastrocnemius veins. No cystic structure found in the popliteal fossa. Left: There is no evidence of deep vein thrombosis in the lower extremity. No cystic structure found in the popliteal fossa   02/17/2019 Tumor Marker   Patient's  tumor was tested for the following markers: CA-125 Results of the tumor marker test revealed 127   03/10/2019 Tumor Marker   Patient's tumor was tested for the following markers: CA-125. Results of the tumor marker test revealed 87.4   03/14/2019 - 03/16/2019 Hospital Admission   She was admitted to the hospital recently for weakness   03/31/2019 Tumor Marker   Patient's tumor was tested for the following markers: CA-125 Results of the tumor marker test revealed 59.2.   04/28/2019 Tumor Marker   Patient's tumor was tested for the following markers: CA-125 Results of the tumor marker test revealed 56.8   05/05/2019 Imaging   1. Interval decrease in size of the large cystic mass in the central pelvis. Mesenteric and omental soft tissue disease shows no substantial interval change. 2. The mild right hydroureteronephrosis seen previously has resolved in the interval. 3. Small residual nonobstructive thrombus identified in the inter lobar pulmonary artery common here into the lateral wall compatible with chronicity. 4. 14 mm subtle enhancing lesion in the anterior right liver is stable. This may be vascular malformation. Attention on follow-up recommended. 5. Stable appearance of the multiple small bilateral pulmonary nodules. Continued attention on follow-up recommended. 6.  Aortic Atherosclerois (ICD10-170.0)     06/06/2019 Pathology Results   A. OMENTUM, RESECTION: - Metastatic carcinoma. B. RIGHT FALLOPIAN TUBE AND OVARY, SALPINGOOOPHORECTOMY: - High-grade serous carcinoma, spanning 9 cm. - No surface involvement identified. - Fallopian tube involved by carcinoma. - See oncology table. C. PERITONEAL NODULE, EXCISION: - Metastatic carcinoma. ONCOLOGY TABLE:  OVARY or FALLOPIAN TUBE or PRIMARY PERITONEUM: Procedure: Right salpingo-oophorectomy, omental resection, and peritoneal biopsy. Specimen Integrity: Intact Tumor Site: Right ovary Ovarian Surface Involvement (required only if  applicable): Not identified Tumor Size: 9 cm Histologic Type: High-grade serous carcinoma Histologic Grade: High-grade Implants (required for advanced stage serous/seromucinous borderline tumors only): Omentum, peritoneum. Other Tissue/ Organ Involvement: Right fallopian tube Largest Extrapelvic Peritoneal Focus (required only if applicable): 7.3 cm Peritoneal/Ascitic Fluid: Positive pleural fluid pre neoadjuvant therapy. Treatment Effect (required only for high-grade serous carcinomas): Probable treatment effect present. Regional Lymph Nodes: No lymph nodes submitted or found Pathologic Stage Classification (pTNM, AJCC 8th Edition): ypT3c, ypNX Representative Tumor Block: B2   06/06/2019 Surgery   Preoperative Diagnosis: stage IV ovarian cancer, s/p neoadjuvant chemotherapy, history of recent PE.  Procedure(s) Performed: Exploratory laparotomy with right salpingo-oophorectomy, omentectomy radical tumor debulking for ovarian cancer .   Surgeon: Thereasa Solo, MD.    Operative Findings:  Omental cake adherent to anterior abdominal wall and hepatic flexure. 10cm right tube and ovary. Surgically absent uterus and left tube and ovary. Granular nodularity across right diaphragm.    This represented an optimal cytoreduction (R0) with no gross visible disease remaining.    07/06/2019 Tumor Marker   Patient's tumor was tested for the following markers:CA-125 Results of the tumor marker test revealed 16.4   08/10/2019 Genetic Testing   Negative genetic testing. No pathogenic variants identified. VUS in BRCA2 called [id number] identified on the [facility] CancerNext+RNAinsight panel. The report date is 08/10/2019.  The CancerNext+RNAinsight gene panel offered by [facility] includes sequencing and rearrangement analysis for the following 36 genes: APC*, ATM*, AXIN2, BARD1, BMPR1A, BRCA1*, BRCA2*, BRIP1*, CDH1*, CDK4, CDKN2A, CHEK2*, DICER1, MLH1*, MSH2*, MSH3, MSH6*, MUTYH*, NBN, NF1*, NTHL1, PALB2*,  PMS2*, PTEN*, RAD51C*, RAD51D*, RECQL, SMAD4, SMARCA4, STK11 and TP53* (sequencing and deletion/duplication); HOXB13, POLD1 and POLE (sequencing only); EPCAM and GREM1 (deletion/duplication only). DNA and RNA analyses performed for * genes.   HRD (somatic) testing was initially ordered and was not completed due to insufficient amount of tumor sample.    08/24/2019 Tumor Marker   Patient's tumor was tested for the following markers: CA-125 Results of the tumor marker test revealed 12.2   09/20/2019 Imaging   1. Interval debulking, bilateral salpingo oophorectomy and omentectomy with resection of the dominant pelvic cystic lesion and the bandlike soft tissue seen previously in the right mesentery/omentum. Today's study is status shows new postoperative baseline for follow-up. 2. 6 mm gastrohepatic ligament nodule seen on the previous study is stable. Continued attention on follow-up recommended. 3. No new or progressive findings in the chest, abdomen, or pelvis to suggest disease progression. 4. Stable 1.4 cm focus of homogeneous enhancement in the anterior right liver. Stable since at least 11/29/2018 and likely benign. Continued attention on follow-up recommended. 5. Aortic Atherosclerosis (ICD10-I70.0).   09/20/2019 Tumor Marker   Patient's tumor was tested for the following markers: CA-125 Results of the tumor marker test revealed 11.9   12/26/2019 Tumor Marker   Patient's tumor was tested for the following markers: CA-125 Results of the tumor marker test revealed 16.5   03/25/2020 Imaging   1. Interval progression of peritoneal disease which predominantly involves the serosal surface of the proximal and distal transverse colon, and descending colon. 2. New bilateral inguinal adenopathy. 3. No ascites. 4. Aortic atherosclerosis.   03/25/2020 Tumor Marker   Patient's tumor was tested for the following markers: CA-125 Results of the tumor marker test revealed 249   03/28/2020 Echocardiogram  1. Borderline LV Strain; A2C view represents the most accurate acquisition.. Left ventricular ejection fraction, by estimation, is 60 to 65%. Left ventricular ejection fraction by PLAX is 63 %. The left ventricle has normal function. The left ventricle demonstrates regional wall motion abnormalities (see scoring diagram/findings for description). Left ventricular diastolic parameters were normal.  2. Right ventricular systolic function is normal. The right ventricular size is normal.  3. Left atrial size was mildly dilated.  4. The mitral valve is normal in structure. No evidence of mitral valve regurgitation.  5. The aortic valve is grossly normal. Aortic valve regurgitation is mild.  6. The inferior vena cava is normal in size with greater than 50% respiratory variability, suggesting right atrial pressure of 3 mmHg.   04/02/2020 Tumor Marker   Patient's tumor was tested for the following markers: CA-125 Results of the tumor marker test revealed 304   04/02/2020 - 08/22/2020 Chemotherapy   The patient had carboplatin and Doxil for chemotherapy treatment.     05/27/2020 Tumor Marker   Patient's tumor was tested for the following markers: CA-125. Results of the tumor marker test revealed 42.7   06/10/2020 Imaging   Improving peritoneal disease and serosal implants along the left colon, as above.   Improving bilateral inguinal nodes.   06/18/2020 Echocardiogram    1. Left ventricular ejection fraction, by estimation, is 60 to 65%. The left ventricle has normal function. The left ventricle has no regional wall motion abnormalities. There is mild left ventricular hypertrophy of the basal-septal segment. Left ventricular diastolic parameters were normal. The average left ventricular global longitudinal strain is -25.0 %. The global longitudinal strain is normal.  2. Right ventricular systolic function is normal. The right ventricular size is normal.  3. The mitral valve is grossly normal. Mild  mitral valve regurgitation.  4. Tricuspid valve regurgitation is moderate.  5. The aortic valve is tricuspid. There is mild calcification of the aortic valve. There is mild thickening of the aortic valve. Aortic valve regurgitation is mild to moderate.  6. The inferior vena cava is normal in size with <50% respiratory variability, suggesting right atrial pressure of 8 mmHg.   06/25/2020 Tumor Marker   Patient's tumor was tested for the following markers: CA-125 Results of the tumor marker test revealed 23.9   07/15/2020 Imaging   IMPRESSION: 1. Mild amount of acute pulmonary embolism within multiple middle lobe and lower lobe branches of the right pulmonary artery. 2.   Small amount of chronic right pulmonary embolism. 3.   Mild posterior left basilar atelectasis and/or infiltrate. 4.   Small left pleural effusion. 5. Aortic atherosclerosis.   Aortic Atherosclerosis (ICD10-I70.0).     07/15/2020 - 07/17/2020 Hospital Admission   She was admitted to a local hospital after presentation with chest pain and shortness of breath and was found to have significant pulmonary embolism She was anticoagulated and discharged   07/16/2020 Echocardiogram   1. Left ventricular ejection fraction, by estimation, is 60 to 65%. The left ventricle has normal function. The left ventricle has no regional wall motion abnormalities. There is mild left ventricular hypertrophy.  Left ventricular diastolic parameters are indeterminate.   2. Right ventricular systolic function is normal. The right ventricular size is normal. There is normal pulmonary artery systolic pressure. The estimated right ventricular systolic pressure is 83.4 mmHg.   3. The mitral valve is grossly normal. Trivial mitral valve regurgitation.   4. The aortic valve is tricuspid. Aortic valve regurgitation is mild.   5.  The inferior vena cava is normal in size with greater than 50% respiratory variability, suggesting right atrial pressure of 3 mmHg.     07/25/2020 Tumor Marker   Patient's tumor was tested for the following markers: CA-125 Results of the tumor marker test revealed 36.4   09/09/2020 Imaging   1. Mild improvement in peritoneal and serosal metastasis along the splenic flexure and descending colon. 2. Inguinal lymph nodes are slightly increased in size. Lymph nodes increased in size from 06/10/2020 and similar to CT of 03/25/2020. 3. No new peritoneal disease.  No free fluid   09/21/2020 -  Chemotherapy   She started taking Niraparib       10/01/2020 Tumor Marker   Patient's tumor was tested for the following markers: CA-125 Results of the tumor marker test revealed 83.4.     REVIEW OF SYSTEMS:   Constitutional: Denies fevers, chills or abnormal weight loss Eyes: Denies blurriness of vision Ears, nose, mouth, throat, and face: Denies mucositis or sore throat Respiratory: Denies cough, dyspnea or wheezes Cardiovascular: Denies palpitation, chest discomfort or lower extremity swelling Gastrointestinal:  Denies nausea, heartburn or change in bowel habits Skin: Denies abnormal skin rashes Lymphatics: Denies new lymphadenopathy or easy bruising Neurological:Denies numbness, tingling or new weaknesses Behavioral/Psych: Mood is stable, no new changes  All other systems were reviewed with the patient and are negative.  I have reviewed the past medical history, past surgical history, social history and family history with the patient and they are unchanged from previous note.  ALLERGIES:  has No Known Allergies.  MEDICATIONS:  Current Outpatient Medications  Medication Sig Dispense Refill  . amoxicillin (AMOXIL) 500 MG capsule Take 500 mg by mouth in the morning and at bedtime.    Marland Kitchen atenolol (TENORMIN) 25 MG tablet Take 1 tablet (25 mg total) by mouth daily. 30 tablet 1  . acetaminophen (TYLENOL) 325 MG tablet Take 650 mg by mouth every 6 (six) hours as needed for moderate pain or headache.     Marland Kitchen apixaban (ELIQUIS) 5  MG TABS tablet Take 1 tablet (5 mg total) by mouth 2 (two) times daily. --Start around 08/14/20 after you complete your initial starter pack 60 tablet 5  . cholecalciferol (VITAMIN D3) 25 MCG (1000 UNIT) tablet Take 1,000 Units by mouth daily.    Marland Kitchen levothyroxine (SYNTHROID) 75 MCG tablet Take 1 tablet (75 mcg total) by mouth daily before breakfast. 30 tablet 3  . lidocaine-prilocaine (EMLA) cream Apply to affected area once (Patient taking differently: Apply 1 application topically daily as needed (port access).) 30 g 3  . loratadine (CLARITIN) 10 MG tablet Take 10 mg by mouth daily.    . Melatonin 3 MG TABS Take by mouth.    . niraparib tosylate (ZEJULA) 100 MG capsule TAKE 2 CAPSULES (200 MG TOTAL) BY MOUTH DAILY. MAY TAKE AT BEDTIME TO REDUCE NAUSEA AND VOMITING. 60 capsule 11  . ondansetron (ZOFRAN) 8 MG tablet Take 8 mg by mouth every 8 (eight) hours as needed.    . pantoprazole (PROTONIX) 40 MG tablet Take 1 tablet (40 mg total) by mouth daily. 30 tablet 4  . prochlorperazine (COMPAZINE) 10 MG tablet Take 10 mg by mouth every 6 (six) hours as needed.    . senna-docusate (SENOKOT-S) 8.6-50 MG tablet Take 2 tablets by mouth at bedtime. 60 tablet 3  . vitamin B-12 1000 MCG tablet Take 1 tablet (1,000 mcg total) by mouth daily. 30 tablet 3   No current facility-administered medications for this visit.  PHYSICAL EXAMINATION: ECOG PERFORMANCE STATUS: 1 - Symptomatic but completely ambulatory  Vitals:   10/14/20 1057  BP: (!) 159/80  Pulse: 78  Resp: 18  Temp: (!) 97.5 F (36.4 C)  SpO2: 100%   Filed Weights   10/14/20 1057  Weight: 215 lb 6.4 oz (97.7 kg)    GENERAL:alert, no distress and comfortable Musculoskeletal:no cyanosis of digits and no clubbing  NEURO: alert & oriented x 3 with fluent speech, no focal motor/sensory deficits  LABORATORY DATA:  I have reviewed the data as listed    Component Value Date/Time   NA 139 10/14/2020 1032   K 4.0 10/14/2020 1032   CL 102  10/14/2020 1032   CO2 27 10/14/2020 1032   GLUCOSE 91 10/14/2020 1032   BUN 13 10/14/2020 1032   CREATININE 1.03 (H) 10/14/2020 1032   CALCIUM 10.0 10/14/2020 1032   PROT 6.9 10/14/2020 1032   ALBUMIN 3.6 10/14/2020 1032   AST 23 10/14/2020 1032   ALT 24 10/14/2020 1032   ALKPHOS 114 10/14/2020 1032   BILITOT 0.3 10/14/2020 1032   GFRNONAA 56 (L) 10/14/2020 1032   GFRAA >60 03/25/2020 1144    No results found for: SPEP, UPEP  Lab Results  Component Value Date   WBC 3.8 (L) 10/14/2020   NEUTROABS 1.9 10/14/2020   HGB 12.0 10/14/2020   HCT 36.0 10/14/2020   MCV 104.3 (H) 10/14/2020   PLT 144 (L) 10/14/2020      Chemistry      Component Value Date/Time   NA 139 10/14/2020 1032   K 4.0 10/14/2020 1032   CL 102 10/14/2020 1032   CO2 27 10/14/2020 1032   BUN 13 10/14/2020 1032   CREATININE 1.03 (H) 10/14/2020 1032      Component Value Date/Time   CALCIUM 10.0 10/14/2020 1032   ALKPHOS 114 10/14/2020 1032   AST 23 10/14/2020 1032   ALT 24 10/14/2020 1032   BILITOT 0.3 10/14/2020 1032

## 2020-10-16 ENCOUNTER — Emergency Department (HOSPITAL_COMMUNITY)
Admission: EM | Admit: 2020-10-16 | Discharge: 2020-10-16 | Disposition: A | Discharge status: Home / Self Care | Payer: Medicare Other | Attending: Emergency Medicine | Admitting: Emergency Medicine

## 2020-10-16 ENCOUNTER — Emergency Department (HOSPITAL_COMMUNITY): Payer: Medicare Other

## 2020-10-16 ENCOUNTER — Encounter (HOSPITAL_COMMUNITY): Payer: Self-pay

## 2020-10-16 ENCOUNTER — Other Ambulatory Visit: Payer: Self-pay

## 2020-10-16 DIAGNOSIS — R1030 Lower abdominal pain, unspecified: Secondary | ICD-10-CM | POA: Insufficient documentation

## 2020-10-16 DIAGNOSIS — I10 Essential (primary) hypertension: Secondary | ICD-10-CM | POA: Insufficient documentation

## 2020-10-16 DIAGNOSIS — R519 Headache, unspecified: Secondary | ICD-10-CM | POA: Insufficient documentation

## 2020-10-16 DIAGNOSIS — K5900 Constipation, unspecified: Secondary | ICD-10-CM | POA: Insufficient documentation

## 2020-10-16 DIAGNOSIS — R110 Nausea: Secondary | ICD-10-CM | POA: Diagnosis not present

## 2020-10-16 DIAGNOSIS — K219 Gastro-esophageal reflux disease without esophagitis: Secondary | ICD-10-CM | POA: Diagnosis not present

## 2020-10-16 DIAGNOSIS — R109 Unspecified abdominal pain: Secondary | ICD-10-CM | POA: Diagnosis not present

## 2020-10-16 DIAGNOSIS — K760 Fatty (change of) liver, not elsewhere classified: Secondary | ICD-10-CM | POA: Diagnosis not present

## 2020-10-16 LAB — URINALYSIS, ROUTINE W REFLEX MICROSCOPIC
Bacteria, UA: NONE SEEN
Bilirubin Urine: NEGATIVE
Glucose, UA: NEGATIVE mg/dL
Ketones, ur: NEGATIVE mg/dL
Nitrite: NEGATIVE
Protein, ur: NEGATIVE mg/dL
Specific Gravity, Urine: 1.019 (ref 1.005–1.030)
pH: 6 (ref 5.0–8.0)

## 2020-10-16 LAB — COMPREHENSIVE METABOLIC PANEL
ALT: 28 U/L (ref 0–44)
AST: 28 U/L (ref 15–41)
Albumin: 3.5 g/dL (ref 3.5–5.0)
Alkaline Phosphatase: 100 U/L (ref 38–126)
Anion gap: 8 (ref 5–15)
BUN: 13 mg/dL (ref 8–23)
CO2: 25 mmol/L (ref 22–32)
Calcium: 9.6 mg/dL (ref 8.9–10.3)
Chloride: 102 mmol/L (ref 98–111)
Creatinine, Ser: 1.09 mg/dL — ABNORMAL HIGH (ref 0.44–1.00)
GFR, Estimated: 52 mL/min — ABNORMAL LOW (ref >60)
Glucose, Bld: 116 mg/dL — ABNORMAL HIGH (ref 70–99)
Potassium: 3.5 mmol/L (ref 3.5–5.1)
Sodium: 135 mmol/L (ref 135–145)
Total Bilirubin: 0.2 mg/dL — ABNORMAL LOW (ref 0.3–1.2)
Total Protein: 6.7 g/dL (ref 6.5–8.1)

## 2020-10-16 LAB — CBC
HCT: 36.5 % (ref 36.0–46.0)
Hemoglobin: 11.9 g/dL — ABNORMAL LOW (ref 12.0–15.0)
MCH: 34.7 pg — ABNORMAL HIGH (ref 26.0–34.0)
MCHC: 32.6 g/dL (ref 30.0–36.0)
MCV: 106.4 fL — ABNORMAL HIGH (ref 80.0–100.0)
Platelets: 151 10*3/uL (ref 150–400)
RBC: 3.43 MIL/uL — ABNORMAL LOW (ref 3.87–5.11)
RDW: 13.9 % (ref 11.5–15.5)
WBC: 4.9 10*3/uL (ref 4.0–10.5)
nRBC: 0 % (ref 0.0–0.2)

## 2020-10-16 LAB — LIPASE, BLOOD: Lipase: 24 U/L (ref 11–51)

## 2020-10-16 MED ORDER — PROCHLORPERAZINE EDISYLATE 10 MG/2ML IJ SOLN: 10.0000 mg | Freq: Once | INTRAMUSCULAR | Status: DC

## 2020-10-16 MED ORDER — IOHEXOL 300 MG/ML  SOLN
100.0000 mL | Freq: Once | INTRAMUSCULAR | Status: AC | PRN
  Administered 2020-10-16: 100 mL via INTRAVENOUS

## 2020-10-16 MED ORDER — PROCHLORPERAZINE EDISYLATE 10 MG/2ML IJ SOLN
5.0000 mg | Freq: Once | INTRAMUSCULAR | Status: AC
  Administered 2020-10-16: 5 mg via INTRAVENOUS
  Filled 2020-10-16: qty 2

## 2020-10-16 MED ORDER — SODIUM CHLORIDE 0.9 % IV BOLUS
1000.0000 mL | Freq: Once | INTRAVENOUS | Status: AC
  Administered 2020-10-16: 1000 mL via INTRAVENOUS

## 2020-10-16 MED ORDER — DIPHENHYDRAMINE HCL 50 MG/ML IJ SOLN
25.0000 mg | Freq: Once | INTRAMUSCULAR | Status: AC
  Administered 2020-10-16: 25 mg via INTRAVENOUS
  Filled 2020-10-16: qty 1

## 2020-10-16 NOTE — ED Triage Notes (Signed)
Pt c/o headache x 3 weeks. Recently started chemo pill, states headache started after that. Also states she has abd pain related to a UTI.

## 2020-10-16 NOTE — ED Notes (Signed)
Patient transported to CT 

## 2020-10-16 NOTE — ED Notes (Signed)
Okay to use port per MD.

## 2020-10-16 NOTE — ED Provider Notes (Signed)
[facility] Emergency Department Provider Note MRN:  [id number]  Arrival date & time: 10/16/20     Chief Complaint   Headache and Abdominal Pain   History of Present Illness   Paula Peterson is a 80 y.o. year-old female with a history of pulmonary embolism, ovarian cancer presenting to the ED with chief complaint of headache.  Gradual onset headache present for the past 3 weeks.  More recently associated with nausea.  Denies numbness or weakness to the arms or legs.  Also endorsing lower abdominal pain that she attributes to a UTI.  Currently on amoxicillin.  Denies chest pain or shortness of breath, no fever.  Taking oral chemotherapy agent, started this medicine a month ago.  Review of Systems  A complete 10 system review of systems was obtained and all systems are negative except as noted in the HPI and PMH.   Patient's Health History    Past Medical History:  Diagnosis Date  . Back pain   . DVT (deep venous thrombosis) ([place]) 02/02/2019   had DVT first that moved into lung  . Family history of stomach cancer   . Family history of stomach cancer   . GERD (gastroesophageal reflux disease)   . History of pulmonary embolus (PE) 02/02/2019   confirmed by CT chest- had DVT first that moved into lungs per patient  . Hypertension   . Pessary maintenance   . Stroke  [facility])     Past Surgical History:  Procedure Laterality Date  . ABDOMINAL HYSTERECTOMY     ovaries left  . BACK SURGERY    . DEBULKING Bilateral 06/06/2019   Procedure: RADICAL TUMOR DEBULKING;  Surgeon: Everitt Amber, MD;  Location: [facility] ORS;  Service: Gynecology;  Laterality: Bilateral;  . FRACTURE SURGERY    . IR IMAGING GUIDED PORT INSERTION  12/15/2018  . OMENTECTOMY Bilateral 06/06/2019   Procedure: OMENTECTOMY;  Surgeon: Everitt Amber, MD;  Location: [facility] ORS;  Service: Gynecology;  Laterality: Bilateral;  . SALPINGOOPHORECTOMY Bilateral 06/06/2019   Procedure: EXPLORATORY LAPAROTOMY,OPEN RIGHT  SALPINGO OOPHORECTOMY;  Surgeon: Everitt Amber, MD;  Location: [facility] OR[facility];  Service: Gynecology;  Laterality: Bilateral;    Family History  Problem Relation Age of Onset  . Stomach cancer Sister        dx 8s-50s  . Dementia Mother   . Cancer Sister        unk type, hysterectomy, dx 69s    Social History   Socioeconomic History  . Marital status: Married    Spouse name: Fara Olden  . Number of children: 3  . Years of education: Not on file  . Highest education level: Not on file  Occupational History  . Occupation: retired Oncologist  Tobacco Use  . Smoking status: Never Smoker  . Smokeless tobacco: Never Used  Vaping Use  . Vaping Use: Never used  Substance and Sexual Activity  . Alcohol use: No  . Drug use: No  . Sexual activity: Not Currently  Other Topics Concern  . Not on file  Social History Narrative  . Not on file   Social Determinants of Health   Financial Resource Strain: Not on file  Food Insecurity: Not on file  Transportation Needs: Not on file  Physical Activity: Not on file  Stress: Not on file  Social Connections: Not on file  Intimate Partner Violence: Not on file     Physical Exam   Vitals:   10/16/20 0103 10/16/20 0200  BP: (!) 141/73 (!) 120/57  Pulse: 72 75  Resp: 18 19  Temp: 97.7 F (36.5 C)   SpO2: 98% 100%    CONSTITUTIONAL: Well-appearing, NAD NEURO:  Alert and oriented x 3, normal and symmetric strength and sensation, normal coordination, normal speech EYES:  eyes equal and reactive ENT/NECK:  no LAD, no JVD CARDIO: Regular rate, well-perfused, normal S1 and S2 PULM:  CTAB no wheezing or rhonchi GI/GU:  normal bowel sounds, non-distended, mild suprapubic tenderness to palpation MSK/SPINE:  No gross deformities, no edema SKIN:  no rash, atraumatic PSYCH:  Appropriate speech and behavior  *Additional and/or pertinent findings included in MDM below  Diagnostic and Interventional Summary    EKG Interpretation  Date/Time: 3 AM,  October 09, 2018   Ventricular Rate:  70 PR Interval:  225 QRS Duration: 84 QT Interval:  414 QTC Calculation: 447 R Axis:     Text Interpretation: Sinus rhythm, prolonged PR interval Confirmed by Dr. Gerlene Fee at 3:30 AM      Labs Reviewed  CBC - Abnormal; Notable for the following components:      Result Value   RBC 3.43 (*)    Hemoglobin 11.9 (*)    MCV 106.4 (*)    MCH 34.7 (*)    All other components within normal limits  COMPREHENSIVE METABOLIC PANEL - Abnormal; Notable for the following components:   Glucose, Bld 116 (*)    Creatinine, Ser 1.09 (*)    Total Bilirubin 0.2 (*)    GFR, Estimated 52 (*)    All other components within normal limits  URINALYSIS, ROUTINE W REFLEX MICROSCOPIC - Abnormal; Notable for the following components:   Color, Urine STRAW (*)    Hgb urine dipstick MODERATE (*)    Leukocytes,Ua TRACE (*)    All other components within normal limits  LIPASE, BLOOD    CT HEAD WO CONTRAST  Final Result    CT ABDOMEN PELVIS W CONTRAST  Final Result      Medications  sodium chloride 0.9 % bolus 1,000 mL (0 mLs Intravenous Stopped 10/16/20 0316)  diphenhydrAMINE (BENADRYL) injection 25 mg (25 mg Intravenous Given 10/16/20 0134)  prochlorperazine (COMPAZINE) injection 5 mg (5 mg Intravenous Given 10/16/20 0134)  iohexol (OMNIPAQUE) 300 MG/ML solution 100 mL (100 mLs Intravenous Contrast Given 10/16/20 0246)     Procedures  /  Critical Care Procedures  ED Course and Medical Decision Making  I have reviewed the triage vital signs, the nursing notes, and pertinent available records from the EMR.  Listed above are laboratory and imaging tests that I personally ordered, reviewed, and interpreted and then considered in my medical decision making (see below for details).  CT head to evaluate for metastatic disease, intracranial bleeding.  CT abdomen to evaluate for recurrence of ovarian cancer, appendicitis.     CT imaging is reassuring, patient's  headache is nearly resolved after migraine cocktail.  Labs also reassuring.  Appropriate for discharge  Barth Kirks. Sedonia Small, [place] [email or web address]  Final Clinical Impressions(s) / ED Diagnoses     ICD-10-CM   1. Nonintractable headache, unspecified chronicity pattern, unspecified headache type  R51.9   2. Constipation, unspecified constipation type  K59.00     ED Discharge Orders    None       Discharge Instructions Discussed with and Provided to Patient:     Discharge Instructions     You were evaluated in the Emergency Department and after careful evaluation, we did not find any  emergent condition requiring admission or further testing in the hospital.  Your exam/testing today was overall reassuring.  Recommend follow-up with your regular doctors to discuss your headaches.   Please return to the Emergency Department if you experience any worsening of your condition.  Thank you for allowing [place] to be a part of your care.        Maudie Flakes, MD 10/16/20 [phone]

## 2020-10-16 NOTE — Discharge Instructions (Addendum)
You were evaluated in the Emergency Department and after careful evaluation, we did not find any emergent condition requiring admission or further testing in the hospital.  Your exam/testing today was overall reassuring.  Recommend follow-up with your regular doctors to discuss your headaches.   Please return to the Emergency Department if you experience any worsening of your condition.  Thank you for allowing [place] to be a part of your care.

## 2020-10-22 ENCOUNTER — Inpatient Hospital Stay: Payer: Medicare Other | Admitting: Hematology and Oncology

## 2020-10-22 ENCOUNTER — Inpatient Hospital Stay: Payer: Medicare Other

## 2020-10-22 ENCOUNTER — Encounter: Payer: Self-pay | Admitting: Hematology and Oncology

## 2020-10-22 ENCOUNTER — Other Ambulatory Visit: Payer: Self-pay

## 2020-10-22 DIAGNOSIS — I10 Essential (primary) hypertension: Secondary | ICD-10-CM | POA: Diagnosis not present

## 2020-10-22 DIAGNOSIS — C786 Secondary malignant neoplasm of retroperitoneum and peritoneum: Secondary | ICD-10-CM | POA: Diagnosis not present

## 2020-10-22 DIAGNOSIS — Z7189 Other specified counseling: Secondary | ICD-10-CM

## 2020-10-22 DIAGNOSIS — R519 Headache, unspecified: Secondary | ICD-10-CM

## 2020-10-22 DIAGNOSIS — Z79899 Other long term (current) drug therapy: Secondary | ICD-10-CM | POA: Diagnosis not present

## 2020-10-22 DIAGNOSIS — Z9221 Personal history of antineoplastic chemotherapy: Secondary | ICD-10-CM | POA: Diagnosis not present

## 2020-10-22 DIAGNOSIS — N183 Chronic kidney disease, stage 3 unspecified: Secondary | ICD-10-CM | POA: Diagnosis not present

## 2020-10-22 DIAGNOSIS — C561 Malignant neoplasm of right ovary: Secondary | ICD-10-CM | POA: Diagnosis not present

## 2020-10-22 DIAGNOSIS — Z7901 Long term (current) use of anticoagulants: Secondary | ICD-10-CM | POA: Diagnosis not present

## 2020-10-22 DIAGNOSIS — Z86711 Personal history of pulmonary embolism: Secondary | ICD-10-CM | POA: Diagnosis not present

## 2020-10-22 LAB — CMP (CANCER CENTER ONLY)
ALT: 25 U/L (ref 0–44)
AST: 26 U/L (ref 15–41)
Albumin: 3.8 g/dL (ref 3.5–5.0)
Alkaline Phosphatase: 113 U/L (ref 38–126)
Anion gap: 10 (ref 5–15)
BUN: 23 mg/dL (ref 8–23)
CO2: 27 mmol/L (ref 22–32)
Calcium: 9.8 mg/dL (ref 8.9–10.3)
Chloride: 105 mmol/L (ref 98–111)
Creatinine: 1.1 mg/dL — ABNORMAL HIGH (ref 0.44–1.00)
GFR, Estimated: 52 mL/min — ABNORMAL LOW (ref >60)
Glucose, Bld: 102 mg/dL — ABNORMAL HIGH (ref 70–99)
Potassium: 3.9 mmol/L (ref 3.5–5.1)
Sodium: 142 mmol/L (ref 135–145)
Total Bilirubin: 0.4 mg/dL (ref 0.3–1.2)
Total Protein: 7 g/dL (ref 6.5–8.1)

## 2020-10-22 LAB — CBC WITH DIFFERENTIAL (CANCER CENTER ONLY)
Abs Immature Granulocytes: 0 10*3/uL (ref 0.00–0.07)
Basophils Absolute: 0 10*3/uL (ref 0.0–0.1)
Basophils Relative: 1 %
Eosinophils Absolute: 0 10*3/uL (ref 0.0–0.5)
Eosinophils Relative: 1 %
HCT: 35.9 % — ABNORMAL LOW (ref 36.0–46.0)
Hemoglobin: 12 g/dL (ref 12.0–15.0)
Immature Granulocytes: 0 %
Lymphocytes Relative: 44 %
Lymphs Abs: 1.4 10*3/uL (ref 0.7–4.0)
MCH: 35.3 pg — ABNORMAL HIGH (ref 26.0–34.0)
MCHC: 33.4 g/dL (ref 30.0–36.0)
MCV: 105.6 fL — ABNORMAL HIGH (ref 80.0–100.0)
Monocytes Absolute: 0.5 10*3/uL (ref 0.1–1.0)
Monocytes Relative: 14 %
Neutro Abs: 1.3 10*3/uL — ABNORMAL LOW (ref 1.7–7.7)
Neutrophils Relative %: 40 %
Platelet Count: 170 10*3/uL (ref 150–400)
RBC: 3.4 MIL/uL — ABNORMAL LOW (ref 3.87–5.11)
RDW: 14.2 % (ref 11.5–15.5)
WBC Count: 3.3 10*3/uL — ABNORMAL LOW (ref 4.0–10.5)
nRBC: 0 % (ref 0.0–0.2)

## 2020-10-22 MED ORDER — SODIUM CHLORIDE 0.9% FLUSH
10.0000 mL | Freq: Once | INTRAVENOUS | Status: AC
  Administered 2020-10-22: 10 mL
  Filled 2020-10-22: qty 10

## 2020-10-22 MED ORDER — HEPARIN SOD (PORK) LOCK FLUSH 100 UNIT/ML IV SOLN
500.0000 [IU] | Freq: Once | INTRAVENOUS | Status: AC
  Administered 2020-10-22: 500 [IU]
  Filled 2020-10-22: qty 5

## 2020-10-22 NOTE — Assessment & Plan Note (Signed)
Overall, I felt that her uncontrolled hypertension is contributing to her headaches Her blood pressure is better controlled now but the patient is not willing to consider resuming treatment She will continue close blood pressure monitoring at home for now

## 2020-10-22 NOTE — Patient Instructions (Signed)
Implanted Port Insertion, Care After This sheet gives you information about how to care for yourself after your procedure. Your health care provider may also give you more specific instructions. If you have problems or questions, contact your health care provider. What can I expect after the procedure? After the procedure, it is common to have:  Discomfort at the port insertion site.  Bruising on the skin over the port. This should improve over 3-4 days. Follow these instructions at home: Port care  After your port is placed, you will get a manufacturer's information card. The card has information about your port. Keep this card with you at all times.  Take care of the port as told by your health care provider. Ask your health care provider if you or a family member can get training for taking care of the port at home. A home health care nurse may also take care of the port.  Make sure to remember what type of port you have. Incision care  Follow instructions from your health care provider about how to take care of your port insertion site. Make sure you: ? Wash your hands with soap and water before and after you change your bandage (dressing). If soap and water are not available, use hand sanitizer. ? Change your dressing as told by your health care provider. ? Leave stitches (sutures), skin glue, or adhesive strips in place. These skin closures may need to stay in place for 2 weeks or longer. If adhesive strip edges start to loosen and curl up, you may trim the loose edges. Do not remove adhesive strips completely unless your health care provider tells you to do that.  Check your port insertion site every day for signs of infection. Check for: ? Redness, swelling, or pain. ? Fluid or blood. ? Warmth. ? Pus or a bad smell.      Activity  Return to your normal activities as told by your health care provider. Ask your health care provider what activities are safe for you.  Do not  lift anything that is heavier than 10 lb (4.5 kg), or the limit that you are told, until your health care provider says that it is safe. General instructions  Take over-the-counter and prescription medicines only as told by your health care provider.  Do not take baths, swim, or use a hot tub until your health care provider approves. Ask your health care provider if you may take showers. You may only be allowed to take sponge baths.  Do not drive for 24 hours if you were given a sedative during your procedure.  Wear a medical alert bracelet in case of an emergency. This will tell any health care providers that you have a port.  Keep all follow-up visits as told by your health care provider. This is important. Contact a health care provider if:  You cannot flush your port with saline as directed, or you cannot draw blood from the port.  You have a fever or chills.  You have redness, swelling, or pain around your port insertion site.  You have fluid or blood coming from your port insertion site.  Your port insertion site feels warm to the touch.  You have pus or a bad smell coming from the port insertion site. Get help right away if:  You have chest pain or shortness of breath.  You have bleeding from your port that you cannot control. Summary  Take care of the port as told by your   health care provider. Keep the manufacturer's information card with you at all times.  Change your dressing as told by your health care provider.  Contact a health care provider if you have a fever or chills or if you have redness, swelling, or pain around your port insertion site.  Keep all follow-up visits as told by your health care provider. This information is not intended to replace advice given to you by your health care provider. Make sure you discuss any questions you have with your health care provider. Document Revised: 01/11/2018 Document Reviewed: 01/11/2018 Elsevier Patient Education   2021 [employer]  

## 2020-10-22 NOTE — Assessment & Plan Note (Signed)
While it is listed as a potential side effects of niraparib, I felt that the cause of her headache is multifactorial She will continue conservative management for now

## 2020-10-22 NOTE — Progress Notes (Signed)
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[facility]) She had recent imaging study which show no evidence of disease The patient could not tolerate treatment and she felt that her blood pressure and headaches are attributed to neuropathy at She would not consider dose reduction and just want to stop treatment I warned the patient about risk of reoccurrence with discontinuation of maintenance treatment Her cancer relapse within 8 months of stopping adjuvant treatment She is not a candidate for bevacizumab due to uncontrolled hypertension and recurrent life-threatening blood clots It is conceivable that her cancer will relapse in under 8 months without treatment She is willing to accept the risks We will discontinue niraparib I will see her back in 6 weeks for further follow-up  Essential hypertension Overall, I felt that her uncontrolled hypertension is contributing to her headaches Her blood pressure is better controlled now but the patient is not willing to consider resuming treatment She will continue close blood pressure monitoring at home for now  Headache disorder While it is listed as a potential side effects of niraparib, I felt that the cause of her headache is multifactorial She will continue conservative management for now   No orders of the defined types were placed in this encounter.   All questions were answered. The patient knows to call the clinic with any problems, questions or concerns. The total time spent in the appointment was 20 minutes encounter with patients including review of chart and various tests results, discussions about plan of care and coordination of care plan   [name], MD 10/22/2020 1:21 PM  INTERVAL HISTORY: Please see below for problem oriented  charting. 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  She has bloating and early satiety. Shortness of breath with walking. She denied bleeding. She reported constipation with pain with defecation and narrowed stools   11/29/2018 Imaging   1. 13 cm complex cystic lesion in the central pelvis, highly suspicious for ovarian cystadenocarcinoma. 2. Diffuse peritoneal carcinomatosis with mild ascites. 3. Mild lymphadenopathy in porta hepatis and right cardiophrenic angle, suspicious for metastatic disease. 4. Moderate right and tiny left pleural effusions   12/05/2018 Tumor Marker   Patient's tumor was tested for the following markers: CA-125 Results of the tumor marker test revealed 1015.   12/06/2018 Cancer Staging   Staging form: Ovary, Fallopian Tube, and Primary Peritoneal Carcinoma, AJCC 8th Edition - Clinical: FIGO Stage IVA, calculated as Stage IV (cT3c, cN1, cM1) - Signed by [name], MD on 12/06/2018   12/09/2018 Pathology Results   PLEURAL FLUID, RIGHT (SPECIMEN 1 OF 1 COLLECTED 12/09/18): - MALIGNANT CELLS CONSISTENT WITH METASTATIC ADENOCARCINOMA - SEE COMMENT Comment The neoplastic cells are positive for cytokeratin  7 and Pax-8 but negative for cytokeratin 20, TTF-1, CDX-2 and Gata-3. Overall, the phenotype is consistent with the clinical impression of gynecologic primary.     12/15/2018 Procedure   Placement of single lumen port a cath via right internal jugular vein. The catheter tip lies at the cavo-atrial junction. A power injectable port a cath was placed and is ready for immediate use   12/16/2018 - 08/24/2019 Chemotherapy   The patient had carboplatin and taxol for chemotherapy treatment.  She had 7 cycles given neoadjuvant prior to surgery and 3 more cycles after surgery, for a total of 10 cycles of treatment    01/06/2019 Tumor Marker   Patient's tumor was tested for the following markers: CA-125 Results of the tumor marker test revealed 948   02/02/2019 Imaging   1. Massive pulmonary embolism, as discussed above. Given the mildly elevated RV to LV ratio of 0.95, this is associated with increased risk of morbidity and mortality. 2. Today's study demonstrates a mixed response to therapy. Specifically, while there has been regression of the bulky intraperitoneal metastatic disease and regression of previously noted pleural effusions, the large cystic mass in the central pelvis has increased in size compared to the prior study. 3. New right mild hydroureteronephrosis related to extrinsic compression on the distal third of the right ureter by the patient's large pelvic mass. 4. Scattered small pulmonary nodules (predominantly pleural based) appear stable compared to prior examinations. These are nonspecific but warrant continued attention on follow-up studies. 5. Aortic atherosclerosis, in addition to three-vessel coronary artery disease. Assessment for potential risk factor modification, dietary therapy or pharmacologic therapy may be warranted, if clinically indicated.   02/02/2019 - 02/04/2019 Hospital Admission   She was admitted the hospital due to DVT and PE   02/03/2019 Imaging   Bilateral venous Doppler US Right: Findings consistent with acute deep vein thrombosis involving the right femoral vein, right popliteal vein, right peroneal veins, right soleal veins,  and right gastrocnemius veins. No cystic structure found in the popliteal fossa. Left: There is no evidence of deep vein thrombosis in the lower extremity. No cystic structure found in the popliteal fossa   02/17/2019 Tumor Marker   Patient's tumor was tested for the following markers: CA-125 Results of the tumor marker test revealed 127   03/10/2019 Tumor Marker   Patient's tumor was tested for the following markers: CA-125. Results of the tumor marker test revealed 87.4   03/14/2019 - 03/16/2019 Hospital Admission   She was admitted to the hospital recently for weakness   03/31/2019 Tumor Marker   Patient's tumor was tested for the following markers: CA-125 Results of the tumor marker test revealed 59.2.   04/28/2019 Tumor Marker   Patient's tumor was tested for the following markers: CA-125 Results of the tumor marker test revealed 56.8   05/05/2019 Imaging   1. Interval decrease in size of the large cystic mass in the central pelvis. Mesenteric and omental soft tissue disease shows no substantial interval change. 2. The mild right hydroureteronephrosis seen previously has resolved in the interval. 3. Small residual nonobstructive thrombus identified in the inter lobar pulmonary artery common here into the lateral wall compatible with chronicity. 4. 14 mm subtle enhancing lesion in the anterior right liver is stable. This may be vascular malformation. Attention on follow-up recommended. 5. Stable appearance of the multiple small bilateral pulmonary nodules. Continued attention on follow-up recommended. 6.  Aortic Atherosclerois (ICD10-170.0)     06/06/2019 Pathology Results   A. OMENTUM,  RESECTION: - Metastatic carcinoma. B. RIGHT FALLOPIAN TUBE AND OVARY, SALPINGOOOPHORECTOMY: - High-grade serous carcinoma, spanning 9 cm. - No surface involvement identified. - Fallopian tube involved by carcinoma. - See oncology table. C. PERITONEAL NODULE, EXCISION: - Metastatic  carcinoma. ONCOLOGY TABLE: OVARY or FALLOPIAN TUBE or PRIMARY PERITONEUM: Procedure: Right salpingo-oophorectomy, omental resection, and peritoneal biopsy. Specimen Integrity: Intact Tumor Site: Right ovary Ovarian Surface Involvement (required only if applicable): Not identified Tumor Size: 9 cm Histologic Type: High-grade serous carcinoma Histologic Grade: High-grade Implants (required for advanced stage serous/seromucinous borderline tumors only): Omentum, peritoneum. Other Tissue/ Organ Involvement: Right fallopian tube Largest Extrapelvic Peritoneal Focus (required only if applicable): 7.3 cm Peritoneal/Ascitic Fluid: Positive pleural fluid pre neoadjuvant therapy. Treatment Effect (required only for high-grade serous carcinomas): Probable treatment effect present. Regional Lymph Nodes: No lymph nodes submitted or found Pathologic Stage Classification (pTNM, AJCC 8th Edition): ypT3c, ypNX Representative Tumor Block: B2   06/06/2019 Surgery   Preoperative Diagnosis: stage IV ovarian cancer, s/p neoadjuvant chemotherapy, history of recent PE.  Procedure(s) Performed: Exploratory laparotomy with right salpingo-oophorectomy, omentectomy radical tumor debulking for ovarian cancer .   Surgeon: Thereasa Solo, MD.    Operative Findings:  Omental cake adherent to anterior abdominal wall and hepatic flexure. 10cm right tube and ovary. Surgically absent uterus and left tube and ovary. Granular nodularity across right diaphragm.    This represented an optimal cytoreduction (R0) with no gross visible disease remaining.    07/06/2019 Tumor Marker   Patient's tumor was tested for the following markers:CA-125 Results of the tumor marker test revealed 16.4   08/10/2019 Genetic Testing   Negative genetic testing. No pathogenic variants identified. VUS in BRCA2 called [id number] identified on the [facility] CancerNext+RNAinsight panel. The report date is 08/10/2019.  The CancerNext+RNAinsight gene panel  offered by [employer] includes sequencing and rearrangement analysis for the following 36 genes: APC*, ATM*, AXIN2, BARD1, BMPR1A, BRCA1*, BRCA2*, BRIP1*, CDH1*, CDK4, CDKN2A, CHEK2*, DICER1, MLH1*, MSH2*, MSH3, MSH6*, MUTYH*, NBN, NF1*, NTHL1, PALB2*, PMS2*, PTEN*, RAD51C*, RAD51D*, RECQL, SMAD4, SMARCA4, STK11 and TP53* (sequencing and deletion/duplication); HOXB13, POLD1 and POLE (sequencing only); EPCAM and GREM1 (deletion/duplication only). DNA and RNA analyses performed for * genes.   HRD (somatic) testing was initially ordered and was not completed due to insufficient amount of tumor sample.    08/24/2019 Tumor Marker   Patient's tumor was tested for the following markers: CA-125 Results of the tumor marker test revealed 12.2   09/20/2019 Imaging   1. Interval debulking, bilateral salpingo oophorectomy and omentectomy with resection of the dominant pelvic cystic lesion and the bandlike soft tissue seen previously in the right mesentery/omentum. Today's study is status shows new postoperative baseline for follow-up. 2. 6 mm gastrohepatic ligament nodule seen on the previous study is stable. Continued attention on follow-up recommended. 3. No new or progressive findings in the chest, abdomen, or pelvis to suggest disease progression. 4. Stable 1.4 cm focus of homogeneous enhancement in the anterior right liver. Stable since at least 11/29/2018 and likely benign. Continued attention on follow-up recommended. 5. Aortic Atherosclerosis (ICD10-I70.0).   09/20/2019 Tumor Marker   Patient's tumor was tested for the following markers: CA-125 Results of the tumor marker test revealed 11.9   12/26/2019 Tumor Marker   Patient's tumor was tested for the following markers: CA-125 Results of the tumor marker test revealed 16.5   03/25/2020 Imaging   1. Interval progression of peritoneal disease which predominantly involves the serosal surface of the proximal and distal transverse colon, and  descending  colon. 2. New bilateral inguinal adenopathy. 3. No ascites. 4. Aortic atherosclerosis.   03/25/2020 Tumor Marker   Patient's tumor was tested for the following markers: CA-125 Results of the tumor marker test revealed 249   03/28/2020 Echocardiogram    1. Borderline LV Strain; A2C view represents the most accurate acquisition.. Left ventricular ejection fraction, by estimation, is 60 to 65%. Left ventricular ejection fraction by PLAX is 63 %. The left ventricle has normal function. The left ventricle demonstrates regional wall motion abnormalities (see scoring diagram/findings for description). Left ventricular diastolic parameters were normal.  2. Right ventricular systolic function is normal. The right ventricular size is normal.  3. Left atrial size was mildly dilated.  4. The mitral valve is normal in structure. No evidence of mitral valve regurgitation.  5. The aortic valve is grossly normal. Aortic valve regurgitation is mild.  6. The inferior vena cava is normal in size with greater than 50% respiratory variability, suggesting right atrial pressure of 3 mmHg.   04/02/2020 Tumor Marker   Patient's tumor was tested for the following markers: CA-125 Results of the tumor marker test revealed 304   04/02/2020 - 08/22/2020 Chemotherapy   The patient had carboplatin and Doxil for chemotherapy treatment.     05/27/2020 Tumor Marker   Patient's tumor was tested for the following markers: CA-125. Results of the tumor marker test revealed 42.7   06/10/2020 Imaging   Improving peritoneal disease and serosal implants along the left colon, as above.   Improving bilateral inguinal nodes.   06/18/2020 Echocardiogram    1. Left ventricular ejection fraction, by estimation, is 60 to 65%. The left ventricle has normal function. The left ventricle has no regional wall motion abnormalities. There is mild left ventricular hypertrophy of the basal-septal segment. Left ventricular diastolic parameters  were normal. The average left ventricular global longitudinal strain is -25.0 %. The global longitudinal strain is normal.  2. Right ventricular systolic function is normal. The right ventricular size is normal.  3. The mitral valve is grossly normal. Mild mitral valve regurgitation.  4. Tricuspid valve regurgitation is moderate.  5. The aortic valve is tricuspid. There is mild calcification of the aortic valve. There is mild thickening of the aortic valve. Aortic valve regurgitation is mild to moderate.  6. The inferior vena cava is normal in size with <50% respiratory variability, suggesting right atrial pressure of 8 mmHg.   06/25/2020 Tumor Marker   Patient's tumor was tested for the following markers: CA-125 Results of the tumor marker test revealed 23.9   07/15/2020 Imaging   IMPRESSION: 1. Mild amount of acute pulmonary embolism within multiple middle lobe and lower lobe branches of the right pulmonary artery. 2.   Small amount of chronic right pulmonary embolism. 3.   Mild posterior left basilar atelectasis and/or infiltrate. 4.   Small left pleural effusion. 5. Aortic atherosclerosis.   Aortic Atherosclerosis (ICD10-I70.0).     07/15/2020 - 07/17/2020 Hospital Admission   She was admitted to a local hospital after presentation with chest pain and shortness of breath and was found to have significant pulmonary embolism She was anticoagulated and discharged   07/16/2020 Echocardiogram   1. Left ventricular ejection fraction, by estimation, is 60 to 65%. The left ventricle has normal function. The left ventricle has no regional wall motion abnormalities. There is mild left ventricular hypertrophy.  Left ventricular diastolic parameters are indeterminate.   2. Right ventricular systolic function is normal. The right ventricular size is normal.  There is normal pulmonary artery systolic pressure. The estimated right ventricular systolic pressure is 16.1 mmHg.   3. The mitral valve is  grossly normal. Trivial mitral valve regurgitation.   4. The aortic valve is tricuspid. Aortic valve regurgitation is mild.   5. The inferior vena cava is normal in size with greater than 50% respiratory variability, suggesting right atrial pressure of 3 mmHg.    07/25/2020 Tumor Marker   Patient's tumor was tested for the following markers: CA-125 Results of the tumor marker test revealed 36.4   09/09/2020 Imaging   1. Mild improvement in peritoneal and serosal metastasis along the splenic flexure and descending colon. 2. Inguinal lymph nodes are slightly increased in size. Lymph nodes increased in size from 06/10/2020 and similar to CT of 03/25/2020. 3. No new peritoneal disease.  No free fluid   09/21/2020 - 10/21/2020 Chemotherapy   She started taking Niraparib, discontinued due to intolerable side effects from headache and hypertension       10/01/2020 Tumor Marker   Patient's tumor was tested for the following markers: CA-125 Results of the tumor marker test revealed 83.4.   10/16/2020 Imaging   No acute findings in the abdomen or pelvis.   Hepatic steatosis.   Large stool burden throughout the colon.         REVIEW OF SYSTEMS:   Constitutional: Denies fevers, chills or abnormal weight loss Eyes: Denies blurriness of vision Ears, nose, mouth, throat, and face: Denies mucositis or sore throat Respiratory: Denies cough, dyspnea or wheezes Cardiovascular: Denies palpitation, chest discomfort or lower extremity swelling Gastrointestinal:  Denies nausea, heartburn or change in bowel habits Skin: Denies abnormal skin rashes Lymphatics: Denies new lymphadenopathy or easy bruising Neurological:Denies numbness, tingling or new weaknesses Behavioral/Psych: Mood is stable, no new changes  All other systems were reviewed with the patient and are negative.  I have reviewed the past medical history, past surgical history, social history and family history with the patient and they are  unchanged from previous note.  ALLERGIES:  has No Known Allergies.  MEDICATIONS:  Current Outpatient Medications  Medication Sig Dispense Refill  . acetaminophen (TYLENOL) 325 MG tablet Take 650 mg by mouth every 6 (six) hours as needed for moderate pain or headache.     Marland Kitchen amoxicillin (AMOXIL) 500 MG capsule Take 500 mg by mouth in the morning and at bedtime.    Marland Kitchen apixaban (ELIQUIS) 5 MG TABS tablet Take 1 tablet (5 mg total) by mouth 2 (two) times daily. --Start around 08/14/20 after you complete your initial starter pack 60 tablet 5  . atenolol (TENORMIN) 25 MG tablet Take 1 tablet (25 mg total) by mouth daily. 30 tablet 1  . cholecalciferol (VITAMIN D3) 25 MCG (1000 UNIT) tablet Take 1,000 Units by mouth daily.    Marland Kitchen levothyroxine (SYNTHROID) 75 MCG tablet Take 1 tablet (75 mcg total) by mouth daily before breakfast. 30 tablet 3  . lidocaine-prilocaine (EMLA) cream Apply to affected area once (Patient taking differently: Apply 1 application topically daily as needed (port access).) 30 g 3  . loratadine (CLARITIN) 10 MG tablet Take 10 mg by mouth daily.    . Melatonin 3 MG TABS Take by mouth.    . ondansetron (ZOFRAN) 8 MG tablet Take 8 mg by mouth every 8 (eight) hours as needed.    . pantoprazole (PROTONIX) 40 MG tablet Take 1 tablet (40 mg total) by mouth daily. 30 tablet 4  . prochlorperazine (COMPAZINE) 10 MG tablet Take 10  mg by mouth every 6 (six) hours as needed.    . senna-docusate (SENOKOT-S) 8.6-50 MG tablet Take 2 tablets by mouth at bedtime. 60 tablet 3  . vitamin B-12 1000 MCG tablet Take 1 tablet (1,000 mcg total) by mouth daily. 30 tablet 3   No current facility-administered medications for this visit.    PHYSICAL EXAMINATION: ECOG PERFORMANCE STATUS: 1 - Symptomatic but completely ambulatory  Vitals:   10/22/20 1149  BP: 140/68  Pulse: 65  Resp: 19  Temp: 98 F (36.7 C)  SpO2: 100%   Filed Weights   10/22/20 1149  Weight: 214 lb 4.8 oz (97.2 kg)     GENERAL:alert, no distress and comfortable NEURO: alert & oriented x 3 with fluent speech, no focal motor/sensory deficits  LABORATORY DATA:  I have reviewed the data as listed    Component Value Date/Time   NA 142 10/22/2020 1123   K 3.9 10/22/2020 1123   CL 105 10/22/2020 1123   CO2 27 10/22/2020 1123   GLUCOSE 102 (H) 10/22/2020 1123   BUN 23 10/22/2020 1123   CREATININE 1.10 (H) 10/22/2020 1123   CALCIUM 9.8 10/22/2020 1123   PROT 7.0 10/22/2020 1123   ALBUMIN 3.8 10/22/2020 1123   AST 26 10/22/2020 1123   ALT 25 10/22/2020 1123   ALKPHOS 113 10/22/2020 1123   BILITOT 0.4 10/22/2020 1123   GFRNONAA 52 (L) 10/22/2020 1123   GFRAA >60 03/25/2020 1144    No results found for: SPEP, UPEP  Lab Results  Component Value Date   WBC 3.3 (L) 10/22/2020   NEUTROABS 1.3 (L) 10/22/2020   HGB 12.0 10/22/2020   HCT 35.9 (L) 10/22/2020   MCV 105.6 (H) 10/22/2020   PLT 170 10/22/2020      Chemistry      Component Value Date/Time   NA 142 10/22/2020 1123   K 3.9 10/22/2020 1123   CL 105 10/22/2020 1123   CO2 27 10/22/2020 1123   BUN 23 10/22/2020 1123   CREATININE 1.10 (H) 10/22/2020 1123      Component Value Date/Time   CALCIUM 9.8 10/22/2020 1123   ALKPHOS 113 10/22/2020 1123   AST 26 10/22/2020 1123   ALT 25 10/22/2020 1123   BILITOT 0.4 10/22/2020 1123       RADIOGRAPHIC STUDIES: I have personally reviewed the radiological images as listed and agreed with the findings in the report. CT HEAD WO CONTRAST  Result Date: 10/16/2020 CLINICAL DATA:  Headache EXAM: CT HEAD WITHOUT CONTRAST TECHNIQUE: Contiguous axial images were obtained from the base of the skull through the vertex without intravenous contrast. COMPARISON:  None. FINDINGS: Brain: No acute intracranial abnormality. Specifically, no hemorrhage, hydrocephalus, mass lesion, acute infarction, or significant intracranial injury. Vascular: No hyperdense vessel or unexpected calcification. Skull: No acute  calvarial abnormality. Sinuses/Orbits: No acute findings Other: None IMPRESSION: No acute intracranial abnormality. Electronically Signed   By: Rolm Baptise M.D.   On: 10/16/2020 02:50   CT ABDOMEN PELVIS W CONTRAST  Result Date: 10/16/2020 CLINICAL DATA:  Lower abdominal pain.  History of ovarian cancer EXAM: CT ABDOMEN AND PELVIS WITH CONTRAST TECHNIQUE: Multidetector CT imaging of the abdomen and pelvis was performed using the standard protocol following bolus administration of intravenous contrast. CONTRAST:  135m OMNIPAQUE IOHEXOL 300 MG/ML  SOLN COMPARISON:  09/09/2020 FINDINGS: Lower chest: No acute abnormality. Hepatobiliary: Diffuse low-density throughout the liver compatible with fatty infiltration. No focal abnormality. Gallbladder unremarkable. Pancreas: No focal abnormality or ductal dilatation. Spleen: No focal abnormality.  Normal size. Adrenals/Urinary Tract: Small left renal parapelvic cysts. No hydronephrosis. No renal or adrenal mass. Urinary bladder unremarkable. Stomach/Bowel: Large stool burden throughout the colon. No bowel obstruction. Vascular/Lymphatic: No evidence of aneurysm or adenopathy. Aortic atherosclerosis. Reproductive: Prior hysterectomy. No adnexal masses. Pessary in place. Other: No free fluid or free air. Musculoskeletal: No acute bony abnormality. Degenerative changes in the lumbar spine. IMPRESSION: No acute findings in the abdomen or pelvis. Hepatic steatosis. Large stool burden throughout the colon. Electronically Signed   By: Rolm Baptise M.D.   On: 10/16/2020 02:50

## 2020-10-22 NOTE — Assessment & Plan Note (Signed)
She had recent imaging study which show no evidence of disease The patient could not tolerate treatment and she felt that her blood pressure and headaches are attributed to neuropathy at She would not consider dose reduction and just want to stop treatment I warned the patient about risk of reoccurrence with discontinuation of maintenance treatment Her cancer relapse within 8 months of stopping adjuvant treatment She is not a candidate for bevacizumab due to uncontrolled hypertension and recurrent life-threatening blood clots It is conceivable that her cancer will relapse in under 8 months without treatment She is willing to accept the risks We will discontinue niraparib I will see her back in 6 weeks for further follow-up

## 2020-11-01 DIAGNOSIS — R350 Frequency of micturition: Secondary | ICD-10-CM | POA: Diagnosis not present

## 2020-11-01 DIAGNOSIS — Z4689 Encounter for fitting and adjustment of other specified devices: Secondary | ICD-10-CM | POA: Diagnosis not present

## 2020-11-04 ENCOUNTER — Other Ambulatory Visit: Payer: Self-pay | Admitting: Hematology and Oncology

## 2020-12-02 ENCOUNTER — Other Ambulatory Visit: Payer: Self-pay | Admitting: Hematology and Oncology

## 2020-12-03 ENCOUNTER — Inpatient Hospital Stay: Payer: Medicare Other

## 2020-12-03 ENCOUNTER — Inpatient Hospital Stay: Payer: Medicare Other | Attending: Gynecologic Oncology | Admitting: Hematology and Oncology

## 2020-12-03 ENCOUNTER — Other Ambulatory Visit: Payer: Self-pay

## 2020-12-03 ENCOUNTER — Encounter: Payer: Self-pay | Admitting: Hematology and Oncology

## 2020-12-03 DIAGNOSIS — C561 Malignant neoplasm of right ovary: Secondary | ICD-10-CM

## 2020-12-03 DIAGNOSIS — I2699 Other pulmonary embolism without acute cor pulmonale: Secondary | ICD-10-CM

## 2020-12-03 DIAGNOSIS — Z86711 Personal history of pulmonary embolism: Secondary | ICD-10-CM | POA: Insufficient documentation

## 2020-12-03 DIAGNOSIS — Z90721 Acquired absence of ovaries, unilateral: Secondary | ICD-10-CM | POA: Diagnosis not present

## 2020-12-03 DIAGNOSIS — Z79899 Other long term (current) drug therapy: Secondary | ICD-10-CM | POA: Insufficient documentation

## 2020-12-03 DIAGNOSIS — C786 Secondary malignant neoplasm of retroperitoneum and peritoneum: Secondary | ICD-10-CM | POA: Diagnosis not present

## 2020-12-03 DIAGNOSIS — Z7189 Other specified counseling: Secondary | ICD-10-CM

## 2020-12-03 DIAGNOSIS — Z9221 Personal history of antineoplastic chemotherapy: Secondary | ICD-10-CM | POA: Diagnosis not present

## 2020-12-03 DIAGNOSIS — Z7901 Long term (current) use of anticoagulants: Secondary | ICD-10-CM | POA: Diagnosis not present

## 2020-12-03 DIAGNOSIS — N183 Chronic kidney disease, stage 3 unspecified: Secondary | ICD-10-CM | POA: Diagnosis not present

## 2020-12-03 LAB — CMP (CANCER CENTER ONLY)
ALT: 20 U/L (ref 0–44)
AST: 22 U/L (ref 15–41)
Albumin: 3.6 g/dL (ref 3.5–5.0)
Alkaline Phosphatase: 99 U/L (ref 38–126)
Anion gap: 12 (ref 5–15)
BUN: 19 mg/dL (ref 8–23)
CO2: 24 mmol/L (ref 22–32)
Calcium: 9.9 mg/dL (ref 8.9–10.3)
Chloride: 103 mmol/L (ref 98–111)
Creatinine: 1.19 mg/dL — ABNORMAL HIGH (ref 0.44–1.00)
GFR, Estimated: 47 mL/min — ABNORMAL LOW (ref >60)
Glucose, Bld: 106 mg/dL — ABNORMAL HIGH (ref 70–99)
Potassium: 4.3 mmol/L (ref 3.5–5.1)
Sodium: 139 mmol/L (ref 135–145)
Total Bilirubin: 0.3 mg/dL (ref 0.3–1.2)
Total Protein: 6.9 g/dL (ref 6.5–8.1)

## 2020-12-03 LAB — CBC WITH DIFFERENTIAL (CANCER CENTER ONLY)
Abs Immature Granulocytes: 0 10*3/uL (ref 0.00–0.07)
Basophils Absolute: 0 10*3/uL (ref 0.0–0.1)
Basophils Relative: 1 %
Eosinophils Absolute: 0.1 10*3/uL (ref 0.0–0.5)
Eosinophils Relative: 2 %
HCT: 36.3 % (ref 36.0–46.0)
Hemoglobin: 12.1 g/dL (ref 12.0–15.0)
Immature Granulocytes: 0 %
Lymphocytes Relative: 43 %
Lymphs Abs: 1.8 10*3/uL (ref 0.7–4.0)
MCH: 33.8 pg (ref 26.0–34.0)
MCHC: 33.3 g/dL (ref 30.0–36.0)
MCV: 101.4 fL — ABNORMAL HIGH (ref 80.0–100.0)
Monocytes Absolute: 0.5 10*3/uL (ref 0.1–1.0)
Monocytes Relative: 12 %
Neutro Abs: 1.8 10*3/uL (ref 1.7–7.7)
Neutrophils Relative %: 42 %
Platelet Count: 216 10*3/uL (ref 150–400)
RBC: 3.58 MIL/uL — ABNORMAL LOW (ref 3.87–5.11)
RDW: 13.8 % (ref 11.5–15.5)
WBC Count: 4.2 10*3/uL (ref 4.0–10.5)
nRBC: 0 % (ref 0.0–0.2)

## 2020-12-03 MED ORDER — HEPARIN SOD (PORK) LOCK FLUSH 100 UNIT/ML IV SOLN
500.0000 [IU] | Freq: Once | INTRAVENOUS | Status: AC
Start: 2020-12-03 — End: 2020-12-03
  Administered 2020-12-03: 500 [IU]
  Filled 2020-12-03: qty 5

## 2020-12-03 MED ORDER — SODIUM CHLORIDE 0.9% FLUSH
10.0000 mL | Freq: Once | INTRAVENOUS | Status: AC
Start: 2020-12-03 — End: 2020-12-03
  Administered 2020-12-03: 10 mL
  Filled 2020-12-03: qty 10

## 2020-12-03 NOTE — Progress Notes (Signed)
[place] OFFICE PROGRESS NOTE  Patient Care Team: Caryl Bis, MD as PCP - General (Family Medicine) Awanda Mink Craige Cotta, RN as Oncology Nurse Navigator (Oncology)  ASSESSMENT & PLAN:  Right ovarian epithelial cancer [facility]) Her last imaging studies show no evidence of disease The patient has made informed decision not to pursue maintenance treatment with either niraparib or bevacizumab She is not symptomatic today We will call her with results of CA125 In the absence of disease, I think is reasonable to perform active surveillance The patient is educated to watch out for signs and symptoms of cancer recurrence  CKD (chronic kidney disease), stage III ([place]) So far, her creatinine function is stable Monitor closely for now  PE (pulmonary thromboembolism) ([employer] [place]) She had recurrent PE despite improved disease control on recent imaging She will continue anticoagulation indefinitely She denies recent bleeding complication   No orders of the defined types were placed in this encounter.   All questions were answered. The patient knows to call the clinic with any problems, questions or concerns. The total time spent in the appointment was 20 minutes encounter with patients including review of chart and various tests results, discussions about plan of care and coordination of care plan   [name], MD 12/03/2020 6:55 PM  INTERVAL HISTORY: Please see below for problem oriented charting. 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  She has bloating and early satiety. Shortness of breath with walking. She denied bleeding. She reported constipation with pain with defecation and narrowed stools   11/29/2018 Imaging   1. 13 cm complex cystic lesion in the central pelvis, highly suspicious for ovarian cystadenocarcinoma. 2. Diffuse peritoneal carcinomatosis with mild ascites. 3. Mild lymphadenopathy in porta hepatis and right cardiophrenic angle, suspicious for metastatic disease. 4. Moderate right and tiny left pleural effusions   12/05/2018 Tumor Marker   Patient's tumor was tested for the following markers: CA-125 Results of the tumor marker test revealed 1015.   12/06/2018 Cancer Staging   Staging form: Ovary, Fallopian Tube, and Primary Peritoneal Carcinoma, AJCC 8th Edition - Clinical: FIGO Stage IVA, calculated as Stage IV (cT3c, cN1, cM1) - Signed by [name], MD on 12/06/2018   12/09/2018 Pathology Results   PLEURAL FLUID, RIGHT (SPECIMEN 1 OF 1 COLLECTED 12/09/18): - MALIGNANT CELLS CONSISTENT WITH METASTATIC ADENOCARCINOMA - SEE COMMENT Comment The neoplastic cells are positive for cytokeratin 7 and Pax-8 but negative for cytokeratin 20, TTF-1, CDX-2 and Gata-3. Overall, the phenotype is consistent with the clinical impression of gynecologic primary.    12/15/2018 Procedure   Placement of single lumen port a cath via right internal jugular vein. The catheter tip lies at the cavo-atrial junction. A power injectable port a cath was placed and is ready for immediate use   12/16/2018 - 08/24/2019 Chemotherapy   The patient had carboplatin and taxol for chemotherapy treatment.  She had 7 cycles given neoadjuvant prior to surgery and 3 more cycles after surgery, for a total of 10 cycles of treatment    01/06/2019 Tumor Marker  Patient's tumor was tested for the following markers: CA-125 Results of the tumor marker test revealed 948   02/02/2019 Imaging   1. Massive pulmonary embolism,  as discussed above. Given the mildly elevated RV to LV ratio of 0.95, this is associated with increased risk of morbidity and mortality. 2. Today's study demonstrates a mixed response to therapy. Specifically, while there has been regression of the bulky intraperitoneal metastatic disease and regression of previously noted pleural effusions, the large cystic mass in the central pelvis has increased in size compared to the prior study. 3. New right mild hydroureteronephrosis related to extrinsic compression on the distal third of the right ureter by the patient's large pelvic mass. 4. Scattered small pulmonary nodules (predominantly pleural based) appear stable compared to prior examinations. These are nonspecific but warrant continued attention on follow-up studies. 5. Aortic atherosclerosis, in addition to three-vessel coronary artery disease. Assessment for potential risk factor modification, dietary therapy or pharmacologic therapy may be warranted, if clinically indicated.   02/02/2019 - 02/04/2019 Hospital Admission   She was admitted the hospital due to DVT and PE   02/03/2019 Imaging   Bilateral venous Doppler US Right: Findings consistent with acute deep vein thrombosis involving the right femoral vein, right popliteal vein, right peroneal veins, right soleal veins, and right gastrocnemius veins. No cystic structure found in the popliteal fossa. Left: There is no evidence of deep vein thrombosis in the lower extremity. No cystic structure found in the popliteal fossa   02/17/2019 Tumor Marker   Patient's tumor was tested for the following markers: CA-125 Results of the tumor marker test revealed 127   03/10/2019 Tumor Marker   Patient's tumor was tested for the following markers: CA-125. Results of the tumor marker test revealed 87.4   03/14/2019 - 03/16/2019 Hospital Admission   She was admitted to the hospital recently for weakness   03/31/2019 Tumor Marker   Patient's tumor was tested for  the following markers: CA-125 Results of the tumor marker test revealed 59.2.   04/28/2019 Tumor Marker   Patient's tumor was tested for the following markers: CA-125 Results of the tumor marker test revealed 56.8   05/05/2019 Imaging   1. Interval decrease in size of the large cystic mass in the central pelvis. Mesenteric and omental soft tissue disease shows no substantial interval change. 2. The mild right hydroureteronephrosis seen previously has resolved in the interval. 3. Small residual nonobstructive thrombus identified in the inter lobar pulmonary artery common here into the lateral wall compatible with chronicity. 4. 14 mm subtle enhancing lesion in the anterior right liver is stable. This may be vascular malformation. Attention on follow-up recommended. 5. Stable appearance of the multiple small bilateral pulmonary nodules. Continued attention on follow-up recommended. 6.  Aortic Atherosclerois (ICD10-170.0)     06/06/2019 Pathology Results   A. OMENTUM, RESECTION: - Metastatic carcinoma. B. RIGHT FALLOPIAN TUBE AND OVARY, SALPINGOOOPHORECTOMY: - High-grade serous carcinoma, spanning 9 cm. - No surface involvement identified. - Fallopian tube involved by carcinoma. - See oncology table. C. PERITONEAL NODULE, EXCISION: - Metastatic carcinoma. ONCOLOGY TABLE: OVARY or FALLOPIAN TUBE or PRIMARY PERITONEUM: Procedure: Right salpingo-oophorectomy, omental resection, and peritoneal biopsy. Specimen Integrity: Intact Tumor Site: Right ovary Ovarian Surface Involvement (required only if applicable): Not identified Tumor Size: 9 cm Histologic Type: High-grade serous carcinoma Histologic Grade: High-grade Implants (required for advanced stage serous/seromucinous borderline tumors only): Omentum, peritoneum. Other Tissue/ Organ Involvement: Right fallopian tube Largest Extrapelvic Peritoneal Focus (required only if applicable): 7.3 cm Peritoneal/Ascitic  Fluid: Positive pleural  fluid pre neoadjuvant therapy. Treatment Effect (required only for high-grade serous carcinomas): Probable treatment effect present. Regional Lymph Nodes: No lymph nodes submitted or found Pathologic Stage Classification (pTNM, AJCC 8th Edition): ypT3c, ypNX Representative Tumor Block: B2   06/06/2019 Surgery   Preoperative Diagnosis: stage IV ovarian cancer, s/p neoadjuvant chemotherapy, history of recent PE.  Procedure(s) Performed: Exploratory laparotomy with right salpingo-oophorectomy, omentectomy radical tumor debulking for ovarian cancer .   Surgeon: Thereasa Solo, MD.    Operative Findings:  Omental cake adherent to anterior abdominal wall and hepatic flexure. 10cm right tube and ovary. Surgically absent uterus and left tube and ovary. Granular nodularity across right diaphragm.    This represented an optimal cytoreduction (R0) with no gross visible disease remaining.    07/06/2019 Tumor Marker   Patient's tumor was tested for the following markers:CA-125 Results of the tumor marker test revealed 16.4   08/10/2019 Genetic Testing   Negative genetic testing. No pathogenic variants identified. VUS in BRCA2 called [id number] identified on the [facility] CancerNext+RNAinsight panel. The report date is 08/10/2019.  The CancerNext+RNAinsight gene panel offered by [facility] includes sequencing and rearrangement analysis for the following 36 genes: APC*, ATM*, AXIN2, BARD1, BMPR1A, BRCA1*, BRCA2*, BRIP1*, CDH1*, CDK4, CDKN2A, CHEK2*, DICER1, MLH1*, MSH2*, MSH3, MSH6*, MUTYH*, NBN, NF1*, NTHL1, PALB2*, PMS2*, PTEN*, RAD51C*, RAD51D*, RECQL, SMAD4, SMARCA4, STK11 and TP53* (sequencing and deletion/duplication); HOXB13, POLD1 and POLE (sequencing only); EPCAM and GREM1 (deletion/duplication only). DNA and RNA analyses performed for * genes.   HRD (somatic) testing was initially ordered and was not completed due to insufficient amount of tumor sample.    08/24/2019 Tumor Marker   Patient's tumor was  tested for the following markers: CA-125 Results of the tumor marker test revealed 12.2   09/20/2019 Imaging   1. Interval debulking, bilateral salpingo oophorectomy and omentectomy with resection of the dominant pelvic cystic lesion and the bandlike soft tissue seen previously in the right mesentery/omentum. Today's study is status shows new postoperative baseline for follow-up. 2. 6 mm gastrohepatic ligament nodule seen on the previous study is stable. Continued attention on follow-up recommended. 3. No new or progressive findings in the chest, abdomen, or pelvis to suggest disease progression. 4. Stable 1.4 cm focus of homogeneous enhancement in the anterior right liver. Stable since at least 11/29/2018 and likely benign. Continued attention on follow-up recommended. 5. Aortic Atherosclerosis (ICD10-I70.0).   09/20/2019 Tumor Marker   Patient's tumor was tested for the following markers: CA-125 Results of the tumor marker test revealed 11.9   12/26/2019 Tumor Marker   Patient's tumor was tested for the following markers: CA-125 Results of the tumor marker test revealed 16.5   03/25/2020 Imaging   1. Interval progression of peritoneal disease which predominantly involves the serosal surface of the proximal and distal transverse colon, and descending colon. 2. New bilateral inguinal adenopathy. 3. No ascites. 4. Aortic atherosclerosis.   03/25/2020 Tumor Marker   Patient's tumor was tested for the following markers: CA-125 Results of the tumor marker test revealed 249   03/28/2020 Echocardiogram    1. Borderline LV Strain; A2C view represents the most accurate acquisition.. Left ventricular ejection fraction, by estimation, is 60 to 65%. Left ventricular ejection fraction by PLAX is 63 %. The left ventricle has normal function. The left ventricle demonstrates regional wall motion abnormalities (see scoring diagram/findings for description). Left ventricular diastolic parameters were normal.   2. Right ventricular systolic function is normal. The right ventricular size is normal.  3. Left atrial size was mildly dilated.  4. The mitral valve is normal in structure. No evidence of mitral valve regurgitation.  5. The aortic valve is grossly normal. Aortic valve regurgitation is mild.  6. The inferior vena cava is normal in size with greater than 50% respiratory variability, suggesting right atrial pressure of 3 mmHg.   04/02/2020 Tumor Marker   Patient's tumor was tested for the following markers: CA-125 Results of the tumor marker test revealed 304   04/02/2020 - 08/22/2020 Chemotherapy   The patient had carboplatin and Doxil for chemotherapy treatment.     05/27/2020 Tumor Marker   Patient's tumor was tested for the following markers: CA-125. Results of the tumor marker test revealed 42.7   06/10/2020 Imaging   Improving peritoneal disease and serosal implants along the left colon, as above.   Improving bilateral inguinal nodes.   06/18/2020 Echocardiogram    1. Left ventricular ejection fraction, by estimation, is 60 to 65%. The left ventricle has normal function. The left ventricle has no regional wall motion abnormalities. There is mild left ventricular hypertrophy of the basal-septal segment. Left ventricular diastolic parameters were normal. The average left ventricular global longitudinal strain is -25.0 %. The global longitudinal strain is normal.  2. Right ventricular systolic function is normal. The right ventricular size is normal.  3. The mitral valve is grossly normal. Mild mitral valve regurgitation.  4. Tricuspid valve regurgitation is moderate.  5. The aortic valve is tricuspid. There is mild calcification of the aortic valve. There is mild thickening of the aortic valve. Aortic valve regurgitation is mild to moderate.  6. The inferior vena cava is normal in size with <50% respiratory variability, suggesting right atrial pressure of 8 mmHg.   06/25/2020 Tumor  Marker   Patient's tumor was tested for the following markers: CA-125 Results of the tumor marker test revealed 23.9   07/15/2020 Imaging   IMPRESSION: 1. Mild amount of acute pulmonary embolism within multiple middle lobe and lower lobe branches of the right pulmonary artery. 2.   Small amount of chronic right pulmonary embolism. 3.   Mild posterior left basilar atelectasis and/or infiltrate. 4.   Small left pleural effusion. 5. Aortic atherosclerosis.   Aortic Atherosclerosis (ICD10-I70.0).     07/15/2020 - 07/17/2020 Hospital Admission   She was admitted to a local hospital after presentation with chest pain and shortness of breath and was found to have significant pulmonary embolism She was anticoagulated and discharged   07/16/2020 Echocardiogram   1. Left ventricular ejection fraction, by estimation, is 60 to 65%. The left ventricle has normal function. The left ventricle has no regional wall motion abnormalities. There is mild left ventricular hypertrophy.  Left ventricular diastolic parameters are indeterminate.   2. Right ventricular systolic function is normal. The right ventricular size is normal. There is normal pulmonary artery systolic pressure. The estimated right ventricular systolic pressure is 91.6 mmHg.   3. The mitral valve is grossly normal. Trivial mitral valve regurgitation.   4. The aortic valve is tricuspid. Aortic valve regurgitation is mild.   5. The inferior vena cava is normal in size with greater than 50% respiratory variability, suggesting right atrial pressure of 3 mmHg.    07/25/2020 Tumor Marker   Patient's tumor was tested for the following markers: CA-125 Results of the tumor marker test revealed 36.4   09/09/2020 Imaging   1. Mild improvement in peritoneal and serosal metastasis along the splenic flexure and descending colon. 2. Inguinal lymph  nodes are slightly increased in size. Lymph nodes increased in size from 06/10/2020 and similar to CT of  03/25/2020. 3. No new peritoneal disease.  No free fluid   09/21/2020 - 10/21/2020 Chemotherapy   She started taking Niraparib, discontinued due to intolerable side effects from headache and hypertension       10/01/2020 Tumor Marker   Patient's tumor was tested for the following markers: CA-125 Results of the tumor marker test revealed 83.4.   10/16/2020 Imaging   No acute findings in the abdomen or pelvis.   Hepatic steatosis.   Large stool burden throughout the colon.         REVIEW OF SYSTEMS:   Constitutional: Denies fevers, chills or abnormal weight loss Eyes: Denies blurriness of vision Ears, nose, mouth, throat, and face: Denies mucositis or sore throat Respiratory: Denies cough, dyspnea or wheezes Cardiovascular: Denies palpitation, chest discomfort or lower extremity swelling Gastrointestinal:  Denies nausea, heartburn or change in bowel habits Skin: Denies abnormal skin rashes Lymphatics: Denies new lymphadenopathy or easy bruising Neurological:Denies numbness, tingling or new weaknesses Behavioral/Psych: Mood is stable, no new changes  All other systems were reviewed with the patient and are negative.  I have reviewed the past medical history, past surgical history, social history and family history with the patient and they are unchanged from previous note.  ALLERGIES:  has No Known Allergies.  MEDICATIONS:  Current Outpatient Medications  Medication Sig Dispense Refill  . acetaminophen (TYLENOL) 325 MG tablet Take 650 mg by mouth every 6 (six) hours as needed for moderate pain or headache.     Marland Kitchen amoxicillin (AMOXIL) 500 MG capsule Take 500 mg by mouth in the morning and at bedtime.    Marland Kitchen apixaban (ELIQUIS) 5 MG TABS tablet Take 1 tablet (5 mg total) by mouth 2 (two) times daily. --Start around 08/14/20 after you complete your initial starter pack 60 tablet 5  . atenolol (TENORMIN) 25 MG tablet TAKE ONE (1) TABLET BY MOUTH EVERY DAY 30 tablet 1  .  cholecalciferol (VITAMIN D3) 25 MCG (1000 UNIT) tablet Take 1,000 Units by mouth daily.    Marland Kitchen levothyroxine (SYNTHROID) 75 MCG tablet Take 1 tablet (75 mcg total) by mouth daily before breakfast. 30 tablet 3  . lidocaine-prilocaine (EMLA) cream Apply to affected area once (Patient taking differently: Apply 1 application topically daily as needed (port access).) 30 g 3  . loratadine (CLARITIN) 10 MG tablet Take 10 mg by mouth daily.    . Melatonin 3 MG TABS Take by mouth.    . ondansetron (ZOFRAN) 8 MG tablet Take 8 mg by mouth every 8 (eight) hours as needed.    . pantoprazole (PROTONIX) 40 MG tablet Take 1 tablet (40 mg total) by mouth daily. 30 tablet 4  . prochlorperazine (COMPAZINE) 10 MG tablet Take 10 mg by mouth every 6 (six) hours as needed.    . senna-docusate (SENOKOT-S) 8.6-50 MG tablet Take 2 tablets by mouth at bedtime. 60 tablet 3  . vitamin B-12 1000 MCG tablet Take 1 tablet (1,000 mcg total) by mouth daily. 30 tablet 3   No current facility-administered medications for this visit.    PHYSICAL EXAMINATION: ECOG PERFORMANCE STATUS: 1 - Symptomatic but completely ambulatory  Vitals:   12/03/20 1149  BP: 140/69  Pulse: (!) 55  Resp: 18  Temp: 97.7 F (36.5 C)  SpO2: 97%   Filed Weights   12/03/20 1149  Weight: 213 lb (96.6 kg)    GENERAL:alert, no distress and  comfortable SKIN: skin color, texture, turgor are normal, no rashes or significant lesions EYES: normal, Conjunctiva are pink and non-injected, sclera clear OROPHARYNX:no exudate, no erythema and lips, buccal mucosa, and tongue normal  NECK: supple, thyroid normal size, non-tender, without nodularity LYMPH:  no palpable lymphadenopathy in the cervical, axillary or inguinal LUNGS: clear to auscultation and percussion with normal breathing effort HEART: regular rate & rhythm and no murmurs and no lower extremity edema ABDOMEN:abdomen soft, non-tender and normal bowel sounds Musculoskeletal:no cyanosis of digits  and no clubbing  NEURO: alert & oriented x 3 with fluent speech, no focal motor/sensory deficits  LABORATORY DATA:  I have reviewed the data as listed    Component Value Date/Time   NA 139 12/03/2020 1128   K 4.3 12/03/2020 1128   CL 103 12/03/2020 1128   CO2 24 12/03/2020 1128   GLUCOSE 106 (H) 12/03/2020 1128   BUN 19 12/03/2020 1128   CREATININE 1.19 (H) 12/03/2020 1128   CALCIUM 9.9 12/03/2020 1128   PROT 6.9 12/03/2020 1128   ALBUMIN 3.6 12/03/2020 1128   AST 22 12/03/2020 1128   ALT 20 12/03/2020 1128   ALKPHOS 99 12/03/2020 1128   BILITOT 0.3 12/03/2020 1128   GFRNONAA 47 (L) 12/03/2020 1128   GFRAA >60 03/25/2020 1144    No results found for: SPEP, UPEP  Lab Results  Component Value Date   WBC 4.2 12/03/2020   NEUTROABS 1.8 12/03/2020   HGB 12.1 12/03/2020   HCT 36.3 12/03/2020   MCV 101.4 (H) 12/03/2020   PLT 216 12/03/2020      Chemistry      Component Value Date/Time   NA 139 12/03/2020 1128   K 4.3 12/03/2020 1128   CL 103 12/03/2020 1128   CO2 24 12/03/2020 1128   BUN 19 12/03/2020 1128   CREATININE 1.19 (H) 12/03/2020 1128      Component Value Date/Time   CALCIUM 9.9 12/03/2020 1128   ALKPHOS 99 12/03/2020 1128   AST 22 12/03/2020 1128   ALT 20 12/03/2020 1128   BILITOT 0.3 12/03/2020 1128

## 2020-12-03 NOTE — Assessment & Plan Note (Signed)
Her last imaging studies show no evidence of disease The patient has made informed decision not to pursue maintenance treatment with either niraparib or bevacizumab She is not symptomatic today We will call her with results of CA125 In the absence of disease, I think is reasonable to perform active surveillance The patient is educated to watch out for signs and symptoms of cancer recurrence

## 2020-12-03 NOTE — Assessment & Plan Note (Signed)
She had recurrent PE despite improved disease control on recent imaging She will continue anticoagulation indefinitely She denies recent bleeding complication

## 2020-12-03 NOTE — Assessment & Plan Note (Signed)
So far, her creatinine function is stable Monitor closely for now

## 2020-12-04 ENCOUNTER — Other Ambulatory Visit (HOSPITAL_COMMUNITY): Payer: Self-pay | Admitting: Hematology and Oncology

## 2020-12-04 ENCOUNTER — Telehealth: Payer: Self-pay | Admitting: Oncology

## 2020-12-04 DIAGNOSIS — I2699 Other pulmonary embolism without acute cor pulmonale: Secondary | ICD-10-CM

## 2020-12-04 DIAGNOSIS — C561 Malignant neoplasm of right ovary: Secondary | ICD-10-CM

## 2020-12-04 LAB — CA 125: Cancer Antigen (CA) 125: 146 U/mL — ABNORMAL HIGH (ref 0.0–38.1)

## 2020-12-04 NOTE — Telephone Encounter (Signed)
Left a message for Paula Peterson regarding lab results.  Requested a return call.

## 2020-12-04 NOTE — Telephone Encounter (Signed)
I placed order to be done in 2 weeks Please give her number to call and scehdule

## 2020-12-04 NOTE — Telephone Encounter (Signed)
Called Loeta back and gave her the number for central scheduling.  She will call to get the CT scheduled in 2 weeks.

## 2020-12-04 NOTE — Telephone Encounter (Signed)
Called Paula Peterson and advised her of CA-125 results.  Discussed that we can either watch or repeat imaging which Dr. Alvy Bimler is recommending.  She would like to go ahead with imaging.

## 2020-12-05 ENCOUNTER — Telehealth: Payer: Self-pay

## 2020-12-05 NOTE — Telephone Encounter (Signed)
Returned call. Scheduled appt with Dr. Alvy Bimler on 6/24 at 1020. Left a message asking her to call the office back if needed.

## 2020-12-05 NOTE — Telephone Encounter (Signed)
-----   Message from [name], MD sent at 12/05/2020 10:38 AM EDT ----- Pls schedule 45 mins appt on 6/24 to review CT

## 2020-12-05 NOTE — Telephone Encounter (Signed)
Called and left a message asking her to call the office back. 

## 2020-12-19 ENCOUNTER — Ambulatory Visit (HOSPITAL_COMMUNITY)
Admission: RE | Admit: 2020-12-19 | Discharge: 2020-12-19 | Disposition: A | Discharge status: Home / Self Care | Payer: Medicare Other | Source: Ambulatory Visit | Attending: Hematology and Oncology | Admitting: Hematology and Oncology

## 2020-12-19 ENCOUNTER — Other Ambulatory Visit: Payer: Self-pay

## 2020-12-19 DIAGNOSIS — C561 Malignant neoplasm of right ovary: Secondary | ICD-10-CM | POA: Insufficient documentation

## 2020-12-19 DIAGNOSIS — K449 Diaphragmatic hernia without obstruction or gangrene: Secondary | ICD-10-CM | POA: Diagnosis not present

## 2020-12-19 DIAGNOSIS — K6389 Other specified diseases of intestine: Secondary | ICD-10-CM | POA: Diagnosis not present

## 2020-12-19 DIAGNOSIS — I2510 Atherosclerotic heart disease of native coronary artery without angina pectoris: Secondary | ICD-10-CM | POA: Diagnosis not present

## 2020-12-19 DIAGNOSIS — I2699 Other pulmonary embolism without acute cor pulmonale: Secondary | ICD-10-CM | POA: Insufficient documentation

## 2020-12-19 DIAGNOSIS — Z5111 Encounter for antineoplastic chemotherapy: Secondary | ICD-10-CM | POA: Diagnosis not present

## 2020-12-19 DIAGNOSIS — J8410 Pulmonary fibrosis, unspecified: Secondary | ICD-10-CM | POA: Diagnosis not present

## 2020-12-19 MED ORDER — SODIUM CHLORIDE (PF) 0.9 % IJ SOLN
INTRAMUSCULAR | Status: AC
  Filled 2020-12-19: qty 50

## 2020-12-19 MED ORDER — IOHEXOL 300 MG/ML  SOLN
100.0000 mL | Freq: Once | INTRAMUSCULAR | Status: AC | PRN
  Administered 2020-12-19: 100 mL via INTRAVENOUS

## 2020-12-20 ENCOUNTER — Encounter: Payer: Self-pay | Admitting: Hematology and Oncology

## 2020-12-20 ENCOUNTER — Inpatient Hospital Stay: Payer: Medicare Other | Admitting: Hematology and Oncology

## 2020-12-20 VITALS — BP 147/71 | HR 69 | Temp 97.7°F | Resp 17 | Ht 67.0 in | Wt 213.1 lb

## 2020-12-20 DIAGNOSIS — C786 Secondary malignant neoplasm of retroperitoneum and peritoneum: Secondary | ICD-10-CM | POA: Diagnosis not present

## 2020-12-20 DIAGNOSIS — Z7901 Long term (current) use of anticoagulants: Secondary | ICD-10-CM | POA: Diagnosis not present

## 2020-12-20 DIAGNOSIS — Z9221 Personal history of antineoplastic chemotherapy: Secondary | ICD-10-CM | POA: Diagnosis not present

## 2020-12-20 DIAGNOSIS — Z7189 Other specified counseling: Secondary | ICD-10-CM

## 2020-12-20 DIAGNOSIS — C561 Malignant neoplasm of right ovary: Secondary | ICD-10-CM | POA: Diagnosis not present

## 2020-12-20 DIAGNOSIS — N183 Chronic kidney disease, stage 3 unspecified: Secondary | ICD-10-CM | POA: Diagnosis not present

## 2020-12-20 DIAGNOSIS — Z86711 Personal history of pulmonary embolism: Secondary | ICD-10-CM | POA: Diagnosis not present

## 2020-12-20 DIAGNOSIS — Z79899 Other long term (current) drug therapy: Secondary | ICD-10-CM | POA: Diagnosis not present

## 2020-12-20 MED ORDER — PROCHLORPERAZINE MALEATE 10 MG PO TABS: 10.0000 mg | ORAL_TABLET | Freq: Four times a day (QID) | ORAL | 1 refills | Status: DC | PRN

## 2020-12-20 MED ORDER — ONDANSETRON HCL 8 MG PO TABS: 8.0000 mg | ORAL_TABLET | Freq: Two times a day (BID) | ORAL | 1 refills | Status: DC | PRN

## 2020-12-20 NOTE — Assessment & Plan Note (Signed)
I have reviewed CT imaging with the patient and her sister Unfortunately, she have signs of disease progression Unfortunately, she does not have the luxury of taking a long treatment break I would like to avoid treating her again with carboplatin She is not a candidate for bevacizumab Recommend single agent Taxol for now I would recommend weekly treatment on day 1 and 8, rest day 15 for cycle every 21 days I recommend 3-4 cycles of treatment before assessment of response to treatment She is in agreement to proceed

## 2020-12-20 NOTE — Progress Notes (Signed)
DISCONTINUE ON PATHWAY REGIMEN - Ovarian     A cycle is every 28 days:     Carboplatin      Liposomal doxorubicin   **Always confirm dose/schedule in your pharmacy ordering system**  REASON: Other Reason PRIOR TREATMENT: [id number]: Liposomal Doxorubicin (Doxil) 30 mg/m2 + Carboplatin AUC=5 q28 Days; Stop Treatment After 6 Cycles If Complete Response, Otherwise Continue Treatment Until Progression or Unacceptable Toxicity TREATMENT RESPONSE: Stable Disease (SD)  START OFF PATHWAY REGIMEN - Ovarian   OFF00010:Paclitaxel 80 mg/m2 Weekly:   Administer weekly:     Paclitaxel   **Always confirm dose/schedule in your pharmacy ordering system**  Patient Characteristics: Recurrent or Progressive Disease, Third Line, Platinum Sensitive and ? 6 Months Since Last Platinum Therapy, BRCA Mutation Absent BRCA Mutation Status: Absent Therapeutic Status: Recurrent or Progressive Disease Line of Therapy: Third Line  Intent of Therapy: Non-Curative / Palliative Intent, Discussed with Patient

## 2020-12-20 NOTE — Assessment & Plan Note (Signed)
I reviewed goals of care with the patient With recurrent disease, goal of care is palliative

## 2020-12-20 NOTE — Progress Notes (Signed)
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[facility]) I have reviewed CT imaging with the patient and her sister Unfortunately, she have signs of disease progression Unfortunately, she does not have the luxury of taking a long treatment break I would like to avoid treating her again with carboplatin She is not a candidate for bevacizumab Recommend single agent Taxol for now I would recommend weekly treatment on day 1 and 8, rest day 15 for cycle every 21 days I recommend 3-4 cycles of treatment before assessment of response to treatment She is in agreement to proceed  Goals of care, counseling/discussion I reviewed goals of care with the patient With recurrent disease, goal of care is palliative  Orders Placed This Encounter  Procedures   CBC with Differential ([place] Only)    Standing Status:   Standing    Number of Occurrences:   20    Standing Expiration Date:   12/20/2021   CMP ([place] only)    Standing Status:   Standing    Number of Occurrences:   20    Standing Expiration Date:   12/20/2021    All questions were answered. The patient knows to call the clinic with any problems, questions or concerns. The total time spent in the appointment was 30 minutes encounter with patients including review of chart and various tests results, discussions about plan of care and coordination of care plan   [name], MD 12/20/2020 1:37 PM  INTERVAL HISTORY: Please see below for problem oriented charting. 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  She has bloating and early satiety. Shortness of breath with walking. She denied bleeding. She reported constipation with pain with defecation and narrowed stools    11/29/2018 Imaging   1. 13 cm complex cystic lesion in the central pelvis, highly suspicious for ovarian cystadenocarcinoma. 2. Diffuse peritoneal carcinomatosis with mild ascites. 3. Mild lymphadenopathy in porta hepatis and right cardiophrenic angle, suspicious for metastatic disease. 4. Moderate right and tiny left pleural effusions    12/05/2018 Tumor Marker   Patient's tumor was tested for the following markers: CA-125 Results of the tumor marker test revealed 1015.    12/06/2018 Cancer Staging   Staging form: Ovary, Fallopian Tube, and Primary Peritoneal Carcinoma, AJCC 8th Edition - Clinical: FIGO Stage IVA, calculated as Stage IV (cT3c, cN1, cM1) - Signed by [name], MD on 12/06/2018    12/09/2018 Pathology Results   PLEURAL FLUID, RIGHT (SPECIMEN 1 OF 1 COLLECTED 12/09/18): - MALIGNANT CELLS CONSISTENT WITH METASTATIC ADENOCARCINOMA - SEE COMMENT Comment The neoplastic cells are positive for cytokeratin 7 and Pax-8 but negative for cytokeratin 20, TTF-1, CDX-2 and Gata-3. Overall, the phenotype is consistent with the clinical impression of gynecologic primary.    12/15/2018 Procedure   Placement of single lumen port a cath via right internal jugular vein. The catheter tip lies at the cavo-atrial junction. A  power injectable port a cath was placed and is ready for immediate use   12/16/2018 - 08/24/2019 Chemotherapy   The patient had carboplatin and taxol for chemotherapy treatment.  She had 7 cycles given neoadjuvant prior to surgery and 3 more cycles after surgery, for a total of 10 cycles of treatment    01/06/2019 Tumor Marker   Patient's  tumor was tested for the following markers: CA-125 Results of the tumor marker test revealed 948   02/02/2019 Imaging   1. Massive pulmonary embolism, as discussed above. Given the mildly elevated RV to LV ratio of 0.95, this is associated with increased risk of morbidity and mortality. 2. Today's study demonstrates a mixed response to therapy. Specifically, while there has been regression of the bulky intraperitoneal metastatic disease and regression of previously noted pleural effusions, the large cystic mass in the central pelvis has increased in size compared to the prior study. 3. New right mild hydroureteronephrosis related to extrinsic compression on the distal third of the right ureter by the patient's large pelvic mass. 4. Scattered small pulmonary nodules (predominantly pleural based) appear stable compared to prior examinations. These are nonspecific but warrant continued attention on follow-up studies. 5. Aortic atherosclerosis, in addition to three-vessel coronary artery disease. Assessment for potential risk factor modification, dietary therapy or pharmacologic therapy may be warranted, if clinically indicated.   02/02/2019 - 02/04/2019 Hospital Admission   She was admitted the hospital due to DVT and PE   02/03/2019 Imaging   Bilateral venous Doppler US Right: Findings consistent with acute deep vein thrombosis involving the right femoral vein, right popliteal vein, right peroneal veins, right soleal veins, and right gastrocnemius veins. No cystic structure found in the popliteal fossa. Left: There is no evidence of deep vein thrombosis in the lower extremity. No cystic structure found in the popliteal fossa   02/17/2019 Tumor Marker   Patient's tumor was tested for the following markers: CA-125 Results of the tumor marker test revealed 127   03/10/2019 Tumor Marker   Patient's tumor was tested for the following markers: CA-125. Results of the tumor marker test revealed 87.4    03/14/2019 - 03/16/2019 Hospital Admission   She was admitted to the hospital recently for weakness   03/31/2019 Tumor Marker   Patient's tumor was tested for the following markers: CA-125 Results of the tumor marker test revealed 59.2.   04/28/2019 Tumor Marker   Patient's tumor was tested for the following markers: CA-125 Results of the tumor marker test revealed 56.8   05/05/2019 Imaging   1. Interval decrease in size of the large cystic mass in the central pelvis. Mesenteric and omental soft tissue disease shows no substantial interval change. 2. The mild right hydroureteronephrosis seen previously has resolved in the interval. 3. Small residual nonobstructive thrombus identified in the inter lobar pulmonary artery common here into the lateral wall compatible with chronicity. 4. 14 mm subtle enhancing lesion in the anterior right liver is stable. This may be vascular malformation. Attention on follow-up recommended. 5. Stable appearance of the multiple small bilateral pulmonary nodules. Continued attention on follow-up recommended. 6.  Aortic Atherosclerois (ICD10-170.0)     06/06/2019 Pathology Results   A. OMENTUM, RESECTION: - Metastatic carcinoma. B. RIGHT FALLOPIAN TUBE AND OVARY, SALPINGOOOPHORECTOMY: - High-grade serous carcinoma, spanning 9 cm. - No surface involvement identified. - Fallopian tube involved by carcinoma. - See oncology table. C. PERITONEAL NODULE, EXCISION: - Metastatic carcinoma. ONCOLOGY TABLE: OVARY or FALLOPIAN TUBE or PRIMARY PERITONEUM: Procedure: Right salpingo-oophorectomy,  omental resection, and peritoneal biopsy. Specimen Integrity: Intact Tumor Site: Right ovary Ovarian Surface Involvement (required only if applicable): Not identified Tumor Size: 9 cm Histologic Type: High-grade serous carcinoma Histologic Grade: High-grade Implants (required for advanced stage serous/seromucinous borderline tumors only): Omentum, peritoneum. Other Tissue/  Organ Involvement: Right fallopian tube Largest Extrapelvic Peritoneal Focus (required only if applicable): 7.3 cm Peritoneal/Ascitic Fluid: Positive pleural fluid pre neoadjuvant therapy. Treatment Effect (required only for high-grade serous carcinomas): Probable treatment effect present. Regional Lymph Nodes: No lymph nodes submitted or found Pathologic Stage Classification (pTNM, AJCC 8th Edition): ypT3c, ypNX Representative Tumor Block: B2   06/06/2019 Surgery   Preoperative Diagnosis: stage IV ovarian cancer, s/p neoadjuvant chemotherapy, history of recent PE.  Procedure(s) Performed: Exploratory laparotomy with right salpingo-oophorectomy, omentectomy radical tumor debulking for ovarian cancer .   Surgeon: Thereasa Solo, MD.    Operative Findings:  Omental cake adherent to anterior abdominal wall and hepatic flexure. 10cm right tube and ovary. Surgically absent uterus and left tube and ovary. Granular nodularity across right diaphragm.    This represented an optimal cytoreduction (R0) with no gross visible disease remaining.    07/06/2019 Tumor Marker   Patient's tumor was tested for the following markers:CA-125 Results of the tumor marker test revealed 16.4   08/10/2019 Genetic Testing   Negative genetic testing. No pathogenic variants identified. VUS in BRCA2 called [id number] identified on the [facility] CancerNext+RNAinsight panel. The report date is 08/10/2019.  The CancerNext+RNAinsight gene panel offered by [facility] includes sequencing and rearrangement analysis for the following 36 genes: APC*, ATM*, AXIN2, BARD1, BMPR1A, BRCA1*, BRCA2*, BRIP1*, CDH1*, CDK4, CDKN2A, CHEK2*, DICER1, MLH1*, MSH2*, MSH3, MSH6*, MUTYH*, NBN, NF1*, NTHL1, PALB2*, PMS2*, PTEN*, RAD51C*, RAD51D*, RECQL, SMAD4, SMARCA4, STK11 and TP53* (sequencing and deletion/duplication); HOXB13, POLD1 and POLE (sequencing only); EPCAM and GREM1 (deletion/duplication only). DNA and RNA analyses performed for * genes.    HRD (somatic) testing was initially ordered and was not completed due to insufficient amount of tumor sample.    08/24/2019 Tumor Marker   Patient's tumor was tested for the following markers: CA-125 Results of the tumor marker test revealed 12.2   09/20/2019 Imaging   1. Interval debulking, bilateral salpingo oophorectomy and omentectomy with resection of the dominant pelvic cystic lesion and the bandlike soft tissue seen previously in the right mesentery/omentum. Today's study is status shows new postoperative baseline for follow-up. 2. 6 mm gastrohepatic ligament nodule seen on the previous study is stable. Continued attention on follow-up recommended. 3. No new or progressive findings in the chest, abdomen, or pelvis to suggest disease progression. 4. Stable 1.4 cm focus of homogeneous enhancement in the anterior right liver. Stable since at least 11/29/2018 and likely benign. Continued attention on follow-up recommended. 5. Aortic Atherosclerosis (ICD10-I70.0).   09/20/2019 Tumor Marker   Patient's tumor was tested for the following markers: CA-125 Results of the tumor marker test revealed 11.9   12/26/2019 Tumor Marker   Patient's tumor was tested for the following markers: CA-125 Results of the tumor marker test revealed 16.5   03/25/2020 Imaging   1. Interval progression of peritoneal disease which predominantly involves the serosal surface of the proximal and distal transverse colon, and descending colon. 2. New bilateral inguinal adenopathy. 3. No ascites. 4. Aortic atherosclerosis.   03/25/2020 Tumor Marker   Patient's tumor was tested for the following markers: CA-125 Results of the tumor marker test revealed 249   03/28/2020 Echocardiogram    1. Borderline LV Strain; A2C view represents the  most accurate acquisition.. Left ventricular ejection fraction, by estimation, is 60 to 65%. Left ventricular ejection fraction by PLAX is 63 %. The left ventricle has normal function.  The left ventricle demonstrates regional wall motion abnormalities (see scoring diagram/findings for description). Left ventricular diastolic parameters were normal.  2. Right ventricular systolic function is normal. The right ventricular size is normal.  3. Left atrial size was mildly dilated.  4. The mitral valve is normal in structure. No evidence of mitral valve regurgitation.  5. The aortic valve is grossly normal. Aortic valve regurgitation is mild.  6. The inferior vena cava is normal in size with greater than 50% respiratory variability, suggesting right atrial pressure of 3 mmHg.   04/02/2020 Tumor Marker   Patient's tumor was tested for the following markers: CA-125 Results of the tumor marker test revealed 304   04/02/2020 - 08/22/2020 Chemotherapy   The patient had carboplatin and Doxil for chemotherapy treatment.     05/27/2020 Tumor Marker   Patient's tumor was tested for the following markers: CA-125. Results of the tumor marker test revealed 42.7   06/10/2020 Imaging   Improving peritoneal disease and serosal implants along the left colon, as above.   Improving bilateral inguinal nodes.   06/18/2020 Echocardiogram    1. Left ventricular ejection fraction, by estimation, is 60 to 65%. The left ventricle has normal function. The left ventricle has no regional wall motion abnormalities. There is mild left ventricular hypertrophy of the basal-septal segment. Left ventricular diastolic parameters were normal. The average left ventricular global longitudinal strain is -25.0 %. The global longitudinal strain is normal.  2. Right ventricular systolic function is normal. The right ventricular size is normal.  3. The mitral valve is grossly normal. Mild mitral valve regurgitation.  4. Tricuspid valve regurgitation is moderate.  5. The aortic valve is tricuspid. There is mild calcification of the aortic valve. There is mild thickening of the aortic valve. Aortic valve regurgitation is  mild to moderate.  6. The inferior vena cava is normal in size with <50% respiratory variability, suggesting right atrial pressure of 8 mmHg.   06/25/2020 Tumor Marker   Patient's tumor was tested for the following markers: CA-125 Results of the tumor marker test revealed 23.9   07/15/2020 Imaging   IMPRESSION: 1. Mild amount of acute pulmonary embolism within multiple middle lobe and lower lobe branches of the right pulmonary artery. 2.   Small amount of chronic right pulmonary embolism. 3.   Mild posterior left basilar atelectasis and/or infiltrate. 4.   Small left pleural effusion. 5. Aortic atherosclerosis.   Aortic Atherosclerosis (ICD10-I70.0).     07/15/2020 - 07/17/2020 Hospital Admission   She was admitted to a local hospital after presentation with chest pain and shortness of breath and was found to have significant pulmonary embolism She was anticoagulated and discharged   07/16/2020 Echocardiogram   1. Left ventricular ejection fraction, by estimation, is 60 to 65%. The left ventricle has normal function. The left ventricle has no regional wall motion abnormalities. There is mild left ventricular hypertrophy.  Left ventricular diastolic parameters are indeterminate.   2. Right ventricular systolic function is normal. The right ventricular size is normal. There is normal pulmonary artery systolic pressure. The estimated right ventricular systolic pressure is 98.1 mmHg.   3. The mitral valve is grossly normal. Trivial mitral valve regurgitation.   4. The aortic valve is tricuspid. Aortic valve regurgitation is mild.   5. The inferior vena cava is normal in  size with greater than 50% respiratory variability, suggesting right atrial pressure of 3 mmHg.    07/25/2020 Tumor Marker   Patient's tumor was tested for the following markers: CA-125 Results of the tumor marker test revealed 36.4   09/09/2020 Imaging   1. Mild improvement in peritoneal and serosal metastasis along the  splenic flexure and descending colon. 2. Inguinal lymph nodes are slightly increased in size. Lymph nodes increased in size from 06/10/2020 and similar to CT of 03/25/2020. 3. No new peritoneal disease.  No free fluid   09/21/2020 - 10/21/2020 Chemotherapy   She started taking Niraparib, discontinued due to intolerable side effects from headache and hypertension       10/01/2020 Tumor Marker   Patient's tumor was tested for the following markers: CA-125 Results of the tumor marker test revealed 83.4.   10/16/2020 Imaging   No acute findings in the abdomen or pelvis.   Hepatic steatosis.   Large stool burden throughout the colon.       12/03/2020 Tumor Marker   Patient's tumor was tested for the following markers: CA-125 Results of the tumor marker test revealed 146   12/20/2020 Imaging   1. Signs of peritoneal carcinomatosis. No significant interval change from previous exam. 2. No findings of solid organ metastasis or nodal metastasis within the abdomen or pelvis. Enlarged right inguinal node is mildly increased in size. Stable enlarged left inguinal node. 3. No evidence for metastatic disease to the chest. 4. Aortic atherosclerosis. Coronary artery calcifications.   12/27/2020 -  Chemotherapy    Patient is on Treatment Plan: OVARIAN PACLITAXEL D1,8,15, Q21 D         REVIEW OF SYSTEMS:   Constitutional: Denies fevers, chills or abnormal weight loss Eyes: Denies blurriness of vision Ears, nose, mouth, throat, and face: Denies mucositis or sore throat Respiratory: Denies cough, dyspnea or wheezes Cardiovascular: Denies palpitation, chest discomfort or lower extremity swelling Gastrointestinal:  Denies nausea, heartburn or change in bowel habits Skin: Denies abnormal skin rashes Lymphatics: Denies new lymphadenopathy or easy bruising Neurological:Denies numbness, tingling or new weaknesses Behavioral/Psych: Mood is stable, no new changes  All other systems were reviewed with  the patient and are negative.  I have reviewed the past medical history, past surgical history, social history and family history with the patient and they are unchanged from previous note.  ALLERGIES:  has No Known Allergies.  MEDICATIONS:  Current Outpatient Medications  Medication Sig Dispense Refill   acetaminophen (TYLENOL) 325 MG tablet Take 650 mg by mouth every 6 (six) hours as needed for moderate pain or headache.      amoxicillin (AMOXIL) 500 MG capsule Take 500 mg by mouth in the morning and at bedtime.     apixaban (ELIQUIS) 5 MG TABS tablet Take 1 tablet (5 mg total) by mouth 2 (two) times daily. --Start around 08/14/20 after you complete your initial starter pack 60 tablet 5   atenolol (TENORMIN) 25 MG tablet TAKE ONE (1) TABLET BY MOUTH EVERY DAY 30 tablet 1   cholecalciferol (VITAMIN D3) 25 MCG (1000 UNIT) tablet Take 1,000 Units by mouth daily.     levothyroxine (SYNTHROID) 75 MCG tablet Take 1 tablet (75 mcg total) by mouth daily before breakfast. 30 tablet 3   lidocaine-prilocaine (EMLA) cream Apply to affected area once (Patient taking differently: Apply 1 application topically daily as needed (port access).) 30 g 3   loratadine (CLARITIN) 10 MG tablet Take 10 mg by mouth daily.     Melatonin  3 MG TABS Take by mouth.     ondansetron (ZOFRAN) 8 MG tablet Take 8 mg by mouth every 8 (eight) hours as needed.     ondansetron (ZOFRAN) 8 MG tablet Take 1 tablet (8 mg total) by mouth 2 (two) times daily as needed (Nausea or vomiting). 30 tablet 1   pantoprazole (PROTONIX) 40 MG tablet Take 1 tablet (40 mg total) by mouth daily. 30 tablet 4   prochlorperazine (COMPAZINE) 10 MG tablet Take 10 mg by mouth every 6 (six) hours as needed.     prochlorperazine (COMPAZINE) 10 MG tablet Take 1 tablet (10 mg total) by mouth every 6 (six) hours as needed (Nausea or vomiting). 30 tablet 1   senna-docusate (SENOKOT-S) 8.6-50 MG tablet Take 2 tablets by mouth at bedtime. 60 tablet 3   vitamin  B-12 1000 MCG tablet Take 1 tablet (1,000 mcg total) by mouth daily. 30 tablet 3   No current facility-administered medications for this visit.    PHYSICAL EXAMINATION: ECOG PERFORMANCE STATUS: 1 - Symptomatic but completely ambulatory  Vitals:   12/20/20 0936  BP: (!) 147/71  Pulse: 69  Resp: 17  Temp: 97.7 F (36.5 C)  SpO2: 98%   Filed Weights   12/20/20 0936  Weight: 213 lb 1.6 oz (96.7 kg)    GENERAL:alert, no distress and comfortable Musculoskeletal:no cyanosis of digits and no clubbing  NEURO: alert & oriented x 3 with fluent speech, no focal motor/sensory deficits  LABORATORY DATA:  I have reviewed the data as listed    Component Value Date/Time   NA 139 12/03/2020 1128   K 4.3 12/03/2020 1128   CL 103 12/03/2020 1128   CO2 24 12/03/2020 1128   GLUCOSE 106 (H) 12/03/2020 1128   BUN 19 12/03/2020 1128   CREATININE 1.19 (H) 12/03/2020 1128   CALCIUM 9.9 12/03/2020 1128   PROT 6.9 12/03/2020 1128   ALBUMIN 3.6 12/03/2020 1128   AST 22 12/03/2020 1128   ALT 20 12/03/2020 1128   ALKPHOS 99 12/03/2020 1128   BILITOT 0.3 12/03/2020 1128   GFRNONAA 47 (L) 12/03/2020 1128   GFRAA >60 03/25/2020 1144    No results found for: SPEP, UPEP  Lab Results  Component Value Date   WBC 4.2 12/03/2020   NEUTROABS 1.8 12/03/2020   HGB 12.1 12/03/2020   HCT 36.3 12/03/2020   MCV 101.4 (H) 12/03/2020   PLT 216 12/03/2020      Chemistry      Component Value Date/Time   NA 139 12/03/2020 1128   K 4.3 12/03/2020 1128   CL 103 12/03/2020 1128   CO2 24 12/03/2020 1128   BUN 19 12/03/2020 1128   CREATININE 1.19 (H) 12/03/2020 1128      Component Value Date/Time   CALCIUM 9.9 12/03/2020 1128   ALKPHOS 99 12/03/2020 1128   AST 22 12/03/2020 1128   ALT 20 12/03/2020 1128   BILITOT 0.3 12/03/2020 1128       RADIOGRAPHIC STUDIES: I have reviewed multiple imaging studies with the patient I have personally reviewed the radiological images as listed and agreed with  the findings in the report. CT CHEST ABDOMEN PELVIS W CONTRAST  Result Date: 12/20/2020 CLINICAL DATA:  Restaging ovarian cancer.  Status post chemotherapy. EXAM: CT CHEST, ABDOMEN, AND PELVIS WITH CONTRAST TECHNIQUE: Multidetector CT imaging of the chest, abdomen and pelvis was performed following the standard protocol during bolus administration of intravenous contrast. CONTRAST:  14m OMNIPAQUE IOHEXOL 300 MG/ML  SOLN COMPARISON:  CT  AP 10/16/2020.  CT angio chest 07/15/2020. FINDINGS: CT CHEST FINDINGS Cardiovascular: The heart size appears normal. No pericardial effusion identified. Mild aortic atherosclerosis and coronary artery calcifications. Mediastinum/Nodes: No enlarged mediastinal, hilar, or axillary lymph nodes. Thyroid gland, trachea, and esophagus demonstrate no significant findings. Lungs/Pleura: No pleural effusion identified. Biapical pleuroparenchymal scarring identified. Bandlike area of scarring and volume loss within the right middle lobe is identified. Left posterior diaphragmatic hernia is identified with adjacent parenchymal scarring. No pleural effusion, airspace consolidation, or atelectasis. -Stable 4 mm nodule in the medial right apex compared with 05/05/2019. -Calcified granuloma identified within the posterolateral right upper lobe. -Tiny subpleural nodule along the oblique fissure within the posterior left upper lobe is unchanged measuring 2-3 mm, image 41/4. -No suspicious lung nodules identified at this time. Musculoskeletal: No chest wall mass or suspicious bone lesions identified. CT ABDOMEN PELVIS FINDINGS Hepatobiliary: No focal liver abnormality is seen. No gallstones, gallbladder wall thickening, or biliary dilatation. Pancreas: Unremarkable. No pancreatic ductal dilatation or surrounding inflammatory changes. Spleen: Normal in size without focal abnormality. Adrenals/Urinary Tract: Adrenal glands are unremarkable. Kidneys are normal, without renal calculi, focal lesion,  or hydronephrosis. Bladder is unremarkable. Stomach/Bowel: Stomach is within normal limits. Appendix appears normal. -Serosal implant along the anterior surface of the proximal colon is again noted measuring 4.7 x 1.1 cm, similar to previous exam. -Serosal implant along the transverse colon with adjacent thickening and nodularity of the pleural reflection in the left lower quadrant appears similar, image 73/5 and image 78/5. -Within the right lower quadrant there is a serosal implant along a loop of small bowel measuring 1.3 cm, image 75/5. Formally 0.6 cm. Vascular/Lymphatic: Aortic atherosclerosis without aneurysm. No signs of retroperitoneal or mesenteric adenopathy. Bilateral inguinal adenopathy identified. -Index right inguinal lymph node measures 1.7 cm, image 120/2. Formally 1.3 cm. -Left inguinal lymph node measures 1.5 cm, image 119/2. Previously this measured the same. Reproductive: Status post hysterectomy. Other: Signs of peritoneal carcinomatosis again noted. -Cluster of 2 nodules within the anterior aspect of the left hemiabdomen measures 8 mm, image 87/2. Stable from 03/25/2020. -Left lower quadrant peritoneal nodule measures 1.1 cm, image 93/2. Formally 0.6 cm. -Left upper quadrant perisplenic nodule measures 0.8 cm, image 69/2. Stable. -Along the undersurface of the left hemidiaphragm there is a nodule measuring 6 mm, image 63/6. Formally 4 mm. -Ventral abdominal wall implant at the level of the umbilicus, right of the midline measures 3.2 x 1.0 cm, image 91/2. Similar to previous exam. No ascites or focal fluid collections. Musculoskeletal: No acute or significant osseous findings. IMPRESSION: 1. Signs of peritoneal carcinomatosis. No significant interval change from previous exam. 2. No findings of solid organ metastasis or nodal metastasis within the abdomen or pelvis. Enlarged right inguinal node is mildly increased in size. Stable enlarged left inguinal node. 3. No evidence for metastatic  disease to the chest. 4. Aortic atherosclerosis. Coronary artery calcifications. Aortic Atherosclerosis (ICD10-I70.0). Electronically Signed   By: Kerby Moors M.D.   On: 12/20/2020 08:05

## 2020-12-27 ENCOUNTER — Encounter: Payer: Self-pay | Admitting: Hematology and Oncology

## 2020-12-27 ENCOUNTER — Other Ambulatory Visit: Payer: Self-pay

## 2020-12-27 ENCOUNTER — Inpatient Hospital Stay: Payer: Medicare Other | Attending: Gynecologic Oncology

## 2020-12-27 ENCOUNTER — Inpatient Hospital Stay: Payer: Medicare Other

## 2020-12-27 VITALS — BP 135/61 | HR 58 | Temp 98.1°F | Resp 20

## 2020-12-27 DIAGNOSIS — N183 Chronic kidney disease, stage 3 unspecified: Secondary | ICD-10-CM | POA: Diagnosis not present

## 2020-12-27 DIAGNOSIS — C561 Malignant neoplasm of right ovary: Secondary | ICD-10-CM | POA: Insufficient documentation

## 2020-12-27 DIAGNOSIS — Z90721 Acquired absence of ovaries, unilateral: Secondary | ICD-10-CM | POA: Diagnosis not present

## 2020-12-27 DIAGNOSIS — C786 Secondary malignant neoplasm of retroperitoneum and peritoneum: Secondary | ICD-10-CM | POA: Diagnosis not present

## 2020-12-27 DIAGNOSIS — Z7189 Other specified counseling: Secondary | ICD-10-CM

## 2020-12-27 DIAGNOSIS — Z5111 Encounter for antineoplastic chemotherapy: Secondary | ICD-10-CM | POA: Diagnosis not present

## 2020-12-27 DIAGNOSIS — Z9221 Personal history of antineoplastic chemotherapy: Secondary | ICD-10-CM | POA: Insufficient documentation

## 2020-12-27 DIAGNOSIS — I2699 Other pulmonary embolism without acute cor pulmonale: Secondary | ICD-10-CM | POA: Insufficient documentation

## 2020-12-27 DIAGNOSIS — Z7901 Long term (current) use of anticoagulants: Secondary | ICD-10-CM | POA: Insufficient documentation

## 2020-12-27 DIAGNOSIS — Z79899 Other long term (current) drug therapy: Secondary | ICD-10-CM | POA: Diagnosis not present

## 2020-12-27 DIAGNOSIS — D61818 Other pancytopenia: Secondary | ICD-10-CM | POA: Diagnosis not present

## 2020-12-27 LAB — CMP (CANCER CENTER ONLY)
ALT: 17 U/L (ref 0–44)
AST: 19 U/L (ref 15–41)
Albumin: 3.4 g/dL — ABNORMAL LOW (ref 3.5–5.0)
Alkaline Phosphatase: 97 U/L (ref 38–126)
Anion gap: 10 (ref 5–15)
BUN: 23 mg/dL (ref 8–23)
CO2: 26 mmol/L (ref 22–32)
Calcium: 9.6 mg/dL (ref 8.9–10.3)
Chloride: 102 mmol/L (ref 98–111)
Creatinine: 1.1 mg/dL — ABNORMAL HIGH (ref 0.44–1.00)
GFR, Estimated: 52 mL/min — ABNORMAL LOW (ref >60)
Glucose, Bld: 124 mg/dL — ABNORMAL HIGH (ref 70–99)
Potassium: 4.4 mmol/L (ref 3.5–5.1)
Sodium: 138 mmol/L (ref 135–145)
Total Bilirubin: 0.3 mg/dL (ref 0.3–1.2)
Total Protein: 6.6 g/dL (ref 6.5–8.1)

## 2020-12-27 LAB — CBC WITH DIFFERENTIAL (CANCER CENTER ONLY)
Abs Immature Granulocytes: 0 10*3/uL (ref 0.00–0.07)
Basophils Absolute: 0 10*3/uL (ref 0.0–0.1)
Basophils Relative: 0 %
Eosinophils Absolute: 0.1 10*3/uL (ref 0.0–0.5)
Eosinophils Relative: 2 %
HCT: 33.9 % — ABNORMAL LOW (ref 36.0–46.0)
Hemoglobin: 11.9 g/dL — ABNORMAL LOW (ref 12.0–15.0)
Immature Granulocytes: 0 %
Lymphocytes Relative: 41 %
Lymphs Abs: 1.9 10*3/uL (ref 0.7–4.0)
MCH: 34.4 pg — ABNORMAL HIGH (ref 26.0–34.0)
MCHC: 35.1 g/dL (ref 30.0–36.0)
MCV: 98 fL (ref 80.0–100.0)
Monocytes Absolute: 0.5 10*3/uL (ref 0.1–1.0)
Monocytes Relative: 12 %
Neutro Abs: 2 10*3/uL (ref 1.7–7.7)
Neutrophils Relative %: 45 %
Platelet Count: 221 10*3/uL (ref 150–400)
RBC: 3.46 MIL/uL — ABNORMAL LOW (ref 3.87–5.11)
RDW: 13.4 % (ref 11.5–15.5)
WBC Count: 4.6 10*3/uL (ref 4.0–10.5)
nRBC: 0 % (ref 0.0–0.2)

## 2020-12-27 MED ORDER — SODIUM CHLORIDE 0.9 % IV SOLN
80.0000 mg/m2 | Freq: Once | INTRAVENOUS | Status: AC
  Administered 2020-12-27: 174 mg via INTRAVENOUS
  Filled 2020-12-27: qty 29

## 2020-12-27 MED ORDER — DIPHENHYDRAMINE HCL 50 MG/ML IJ SOLN
12.5000 mg | Freq: Once | INTRAMUSCULAR | Status: AC
Start: 2020-12-27 — End: 2020-12-27
  Administered 2020-12-27: 12.5 mg via INTRAVENOUS

## 2020-12-27 MED ORDER — SODIUM CHLORIDE 0.9 % IV SOLN
Freq: Once | INTRAVENOUS | Status: AC
  Filled 2020-12-27: qty 250

## 2020-12-27 MED ORDER — SODIUM CHLORIDE 0.9% FLUSH
10.0000 mL | INTRAVENOUS | Status: DC | PRN
  Administered 2020-12-27: 10 mL
  Filled 2020-12-27: qty 10

## 2020-12-27 MED ORDER — DIPHENHYDRAMINE HCL 50 MG/ML IJ SOLN
INTRAMUSCULAR | Status: AC
  Filled 2020-12-27: qty 1

## 2020-12-27 MED ORDER — DEXAMETHASONE SODIUM PHOSPHATE 100 MG/10ML IJ SOLN
10.0000 mg | Freq: Once | INTRAMUSCULAR | Status: AC
Start: 2020-12-27 — End: 2020-12-27
  Administered 2020-12-27: 10 mg via INTRAVENOUS
  Filled 2020-12-27: qty 10

## 2020-12-27 MED ORDER — FAMOTIDINE 20 MG IN NS 100 ML IVPB
20.0000 mg | Freq: Once | INTRAVENOUS | Status: AC
Start: 2020-12-27 — End: 2020-12-27
  Administered 2020-12-27: 20 mg via INTRAVENOUS

## 2020-12-27 MED ORDER — HEPARIN SOD (PORK) LOCK FLUSH 100 UNIT/ML IV SOLN
500.0000 [IU] | Freq: Once | INTRAVENOUS | Status: AC | PRN
  Administered 2020-12-27: 500 [IU]
  Filled 2020-12-27: qty 5

## 2020-12-27 MED ORDER — FAMOTIDINE 20 MG IN NS 100 ML IVPB
INTRAVENOUS | Status: AC
  Filled 2020-12-27: qty 100

## 2020-12-27 NOTE — Patient Instructions (Signed)
Implanted Port Home Guide An implanted port is a device that is placed under the skin. It is usually placed in the chest. The device can be used to give IV medicine, to take blood, or for dialysis. You may have an implanted port if: You need IV medicine that would be irritating to the small veins in your hands or arms. You need IV medicines, such as antibiotics, for a long period of time. You need IV nutrition for a long period of time. You need dialysis. When you have a port, your health care provider can choose to use the port instead of veins in your arms for these procedures. You may have fewer limitations when using a port than you would if you used other types of long-term IVs, and you will likely be able to return to normal activities afteryour incision heals. An implanted port has two main parts: Reservoir. The reservoir is the part where a needle is inserted to give medicines or draw blood. The reservoir is round. After it is placed, it appears as a small, raised area under your skin. Catheter. The catheter is a thin, flexible tube that connects the reservoir to a vein. Medicine that is inserted into the reservoir goes into the catheter and then into the vein. How is my port accessed? To access your port: A numbing cream may be placed on the skin over the port site. Your health care provider will put on a mask and sterile gloves. The skin over your port will be cleaned carefully with a germ-killing soap and allowed to dry. Your health care provider will gently pinch the port and insert a needle into it. Your health care provider will check for a blood return to make sure the port is in the vein and is not clogged. If your port needs to remain accessed to get medicine continuously (constant infusion), your health care provider will place a clear bandage (dressing) over the needle site. The dressing and needle will need to be changed every week, or as told by your health care provider. What  is flushing? Flushing helps keep the port from getting clogged. Follow instructions from your health care provider about how and when to flush the port. Ports are usually flushed with saline solution or a medicine called heparin. The need for flushing will depend on how the port is used: If the port is only used from time to time to give medicines or draw blood, the port may need to be flushed: Before and after medicines have been given. Before and after blood has been drawn. As part of routine maintenance. Flushing may be recommended every 4-6 weeks. If a constant infusion is running, the port may not need to be flushed. Throw away any syringes in a disposal container that is meant for sharp items (sharps container). You can buy a sharps container from a pharmacy, or you can make one by using an empty hard plastic bottle with a cover. How long will my port stay implanted? The port can stay in for as long as your health care provider thinks it is needed. When it is time for the port to come out, a surgery will be done to remove it. The surgery will be similar to the procedure that was done to putthe port in. Follow these instructions at home:  Flush your port as told by your health care provider. If you need an infusion over several days, follow instructions from your health care provider about how to take   care of your port site. Make sure you: Wash your hands with soap and water before you change your dressing. If soap and water are not available, use alcohol-based hand sanitizer. Change your dressing as told by your health care provider. Place any used dressings or infusion bags into a plastic bag. Throw that bag in the trash. Keep the dressing that covers the needle clean and dry. Do not get it wet. Do not use scissors or sharp objects near the tube. Keep the tube clamped, unless it is being used. Check your port site every day for signs of infection. Check for: Redness, swelling, or  pain. Fluid or blood. Pus or a bad smell. Protect the skin around the port site. Avoid wearing bra straps that rub or irritate the site. Protect the skin around your port from seat belts. Place a soft pad over your chest if needed. Bathe or shower as told by your health care provider. The site may get wet as long as you are not actively receiving an infusion. Return to your normal activities as told by your health care provider. Ask your health care provider what activities are safe for you. Carry a medical alert card or wear a medical alert bracelet at all times. This will let health care providers know that you have an implanted port in case of an emergency. Get help right away if: You have redness, swelling, or pain at the port site. You have fluid or blood coming from your port site. You have pus or a bad smell coming from the port site. You have a fever. Summary Implanted ports are usually placed in the chest for long-term IV access. Follow instructions from your health care provider about flushing the port and changing bandages (dressings). Take care of the area around your port by avoiding clothing that puts pressure on the area, and by watching for signs of infection. Protect the skin around your port from seat belts. Place a soft pad over your chest if needed. Get help right away if you have a fever or you have redness, swelling, pain, drainage, or a bad smell at the port site. This information is not intended to replace advice given to you by your health care provider. Make sure you discuss any questions you have with your healthcare provider. Document Revised: 10/30/2019 Document Reviewed: 10/30/2019 Elsevier Patient Education  2022 [employer].  

## 2020-12-27 NOTE — Patient Instructions (Signed)
Paclitaxel injection What is this medication? PACLITAXEL (PAK li TAX el) is a chemotherapy drug. It targets fast dividing cells, like cancer cells, and causes these cells to die. This medicine is used to treat ovarian cancer, breast cancer, lung cancer, Kaposi's sarcoma, andother cancers. This medicine may be used for other purposes; ask your health care provider orpharmacist if you have questions. COMMON BRAND NAME(S): Onxol, Taxol What should I tell my care team before I take this medication? They need to know if you have any of these conditions: history of irregular heartbeat liver disease low blood counts, like low white cell, platelet, or red cell counts lung or breathing disease, like asthma tingling of the fingers or toes, or other nerve disorder an unusual or allergic reaction to paclitaxel, alcohol, polyoxyethylated castor oil, other chemotherapy, other medicines, foods, dyes, or preservatives pregnant or trying to get pregnant breast-feeding How should I use this medication? This drug is given as an infusion into a vein. It is administered in a hospitalor clinic by a specially trained health care professional. Talk to your pediatrician regarding the use of this medicine in children.Special care may be needed. Overdosage: If you think you have taken too much of this medicine contact apoison control center or emergency room at once. NOTE: This medicine is only for you. Do not share this medicine with others. What if I miss a dose? It is important not to miss your dose. Call your doctor or health careprofessional if you are unable to keep an appointment. What may interact with this medication? Do not take this medicine with any of the following medications: live virus vaccines This medicine may also interact with the following medications: antiviral medicines for hepatitis, HIV or AIDS certain antibiotics like erythromycin and clarithromycin certain medicines for fungal infections  like ketoconazole and itraconazole certain medicines for seizures like carbamazepine, phenobarbital, phenytoin gemfibrozil nefazodone rifampin St. John's wort This list may not describe all possible interactions. Give your health care provider a list of all the medicines, herbs, non-prescription drugs, or dietary supplements you use. Also tell them if you smoke, drink alcohol, or use illegaldrugs. Some items may interact with your medicine. What should I watch for while using this medication? Your condition will be monitored carefully while you are receiving this medicine. You will need important blood work done while you are taking thismedicine. This medicine can cause serious allergic reactions. To reduce your risk you will need to take other medicine(s) before treatment with this medicine. If you experience allergic reactions like skin rash, itching or hives, swelling of theface, lips, or tongue, tell your doctor or health care professional right away. In some cases, you may be given additional medicines to help with side effects.Follow all directions for their use. This drug may make you feel generally unwell. This is not uncommon, as chemotherapy can affect healthy cells as well as cancer cells. Report any side effects. Continue your course of treatment even though you feel ill unless yourdoctor tells you to stop. Call your doctor or health care professional for advice if you get a fever, chills or sore throat, or other symptoms of a cold or flu. Do not treat yourself. This drug decreases your body's ability to fight infections. Try toavoid being around people who are sick. This medicine may increase your risk to bruise or bleed. Call your doctor orhealth care professional if you notice any unusual bleeding. Be careful brushing and flossing your teeth or using a toothpick because you may get an   infection or bleed more easily. If you have any dental work done,tell your dentist you are receiving  this medicine. Avoid taking products that contain aspirin, acetaminophen, ibuprofen, naproxen, or ketoprofen unless instructed by your doctor. These medicines may hide afever. Do not become pregnant while taking this medicine. Women should inform their doctor if they wish to become pregnant or think they might be pregnant. There is a potential for serious side effects to an unborn child. Talk to your health care professional or pharmacist for more information. Do not breast-feed aninfant while taking this medicine. Men are advised not to father a child while receiving this medicine. This product may contain alcohol. Ask your pharmacist or healthcare provider if this medicine contains alcohol. Be sure to tell all healthcare providers you are taking this medicine. Certain medicines, like metronidazole and disulfiram, can cause an unpleasant reaction when taken with alcohol. The reaction includes flushing, headache, nausea, vomiting, sweating, and increased thirst. Thereaction can last from 30 minutes to several hours. What side effects may I notice from receiving this medication? Side effects that you should report to your doctor or health care professionalas soon as possible: allergic reactions like skin rash, itching or hives, swelling of the face, lips, or tongue breathing problems changes in vision fast, irregular heartbeat high or low blood pressure mouth sores pain, tingling, numbness in the hands or feet signs of decreased platelets or bleeding - bruising, pinpoint red spots on the skin, black, tarry stools, blood in the urine signs of decreased red blood cells - unusually weak or tired, feeling faint or lightheaded, falls signs of infection - fever or chills, cough, sore throat, pain or difficulty passing urine signs and symptoms of liver injury like dark yellow or brown urine; general ill feeling or flu-like symptoms; light-colored stools; loss of appetite; nausea; right upper belly pain;  unusually weak or tired; yellowing of the eyes or skin swelling of the ankles, feet, hands unusually slow heartbeat Side effects that usually do not require medical attention (report to yourdoctor or health care professional if they continue or are bothersome): diarrhea hair loss loss of appetite muscle or joint pain nausea, vomiting pain, redness, or irritation at site where injected tiredness This list may not describe all possible side effects. Call your doctor for medical advice about side effects. You may report side effects to FDA at1-800-FDA-1088. Where should I keep my medication? This drug is given in a hospital or clinic and will not be stored at home. NOTE: This sheet is a summary. It may not cover all possible information. If you have questions about this medicine, talk to your doctor, pharmacist, orhealth care provider.  2022 Elsevier/Gold Standard (2019-05-17 13:37:23)  

## 2020-12-28 LAB — CA 125: Cancer Antigen (CA) 125: 239 U/mL — ABNORMAL HIGH (ref 0.0–38.1)

## 2021-01-03 ENCOUNTER — Encounter: Payer: Self-pay | Admitting: Hematology and Oncology

## 2021-01-03 ENCOUNTER — Inpatient Hospital Stay: Payer: Medicare Other

## 2021-01-03 ENCOUNTER — Inpatient Hospital Stay: Payer: Medicare Other | Admitting: Hematology and Oncology

## 2021-01-03 ENCOUNTER — Other Ambulatory Visit: Payer: Self-pay

## 2021-01-03 DIAGNOSIS — C561 Malignant neoplasm of right ovary: Secondary | ICD-10-CM

## 2021-01-03 DIAGNOSIS — Z9221 Personal history of antineoplastic chemotherapy: Secondary | ICD-10-CM | POA: Diagnosis not present

## 2021-01-03 DIAGNOSIS — Z7189 Other specified counseling: Secondary | ICD-10-CM

## 2021-01-03 DIAGNOSIS — D61818 Other pancytopenia: Secondary | ICD-10-CM | POA: Diagnosis not present

## 2021-01-03 DIAGNOSIS — C786 Secondary malignant neoplasm of retroperitoneum and peritoneum: Secondary | ICD-10-CM | POA: Diagnosis not present

## 2021-01-03 DIAGNOSIS — N183 Chronic kidney disease, stage 3 unspecified: Secondary | ICD-10-CM | POA: Diagnosis not present

## 2021-01-03 DIAGNOSIS — Z7901 Long term (current) use of anticoagulants: Secondary | ICD-10-CM | POA: Diagnosis not present

## 2021-01-03 DIAGNOSIS — I2699 Other pulmonary embolism without acute cor pulmonale: Secondary | ICD-10-CM

## 2021-01-03 DIAGNOSIS — Z5111 Encounter for antineoplastic chemotherapy: Secondary | ICD-10-CM | POA: Diagnosis not present

## 2021-01-03 DIAGNOSIS — Z79899 Other long term (current) drug therapy: Secondary | ICD-10-CM | POA: Diagnosis not present

## 2021-01-03 LAB — CBC WITH DIFFERENTIAL (CANCER CENTER ONLY)
Abs Immature Granulocytes: 0.01 10*3/uL (ref 0.00–0.07)
Basophils Absolute: 0 10*3/uL (ref 0.0–0.1)
Basophils Relative: 1 %
Eosinophils Absolute: 0.1 10*3/uL (ref 0.0–0.5)
Eosinophils Relative: 2 %
HCT: 34.2 % — ABNORMAL LOW (ref 36.0–46.0)
Hemoglobin: 11.9 g/dL — ABNORMAL LOW (ref 12.0–15.0)
Immature Granulocytes: 0 %
Lymphocytes Relative: 54 %
Lymphs Abs: 1.9 10*3/uL (ref 0.7–4.0)
MCH: 34.2 pg — ABNORMAL HIGH (ref 26.0–34.0)
MCHC: 34.8 g/dL (ref 30.0–36.0)
MCV: 98.3 fL (ref 80.0–100.0)
Monocytes Absolute: 0.2 10*3/uL (ref 0.1–1.0)
Monocytes Relative: 7 %
Neutro Abs: 1.3 10*3/uL — ABNORMAL LOW (ref 1.7–7.7)
Neutrophils Relative %: 36 %
Platelet Count: 193 10*3/uL (ref 150–400)
RBC: 3.48 MIL/uL — ABNORMAL LOW (ref 3.87–5.11)
RDW: 13.2 % (ref 11.5–15.5)
WBC Count: 3.5 10*3/uL — ABNORMAL LOW (ref 4.0–10.5)
nRBC: 0 % (ref 0.0–0.2)

## 2021-01-03 LAB — CMP (CANCER CENTER ONLY)
ALT: 24 U/L (ref 0–44)
AST: 24 U/L (ref 15–41)
Albumin: 3.4 g/dL — ABNORMAL LOW (ref 3.5–5.0)
Alkaline Phosphatase: 93 U/L (ref 38–126)
Anion gap: 11 (ref 5–15)
BUN: 15 mg/dL (ref 8–23)
CO2: 26 mmol/L (ref 22–32)
Calcium: 9.5 mg/dL (ref 8.9–10.3)
Chloride: 103 mmol/L (ref 98–111)
Creatinine: 0.95 mg/dL (ref 0.44–1.00)
GFR, Estimated: >60 mL/min (ref >60)
Glucose, Bld: 116 mg/dL — ABNORMAL HIGH (ref 70–99)
Potassium: 3.9 mmol/L (ref 3.5–5.1)
Sodium: 140 mmol/L (ref 135–145)
Total Bilirubin: 0.4 mg/dL (ref 0.3–1.2)
Total Protein: 6.9 g/dL (ref 6.5–8.1)

## 2021-01-03 MED ORDER — FAMOTIDINE 20 MG IN NS 100 ML IVPB
INTRAVENOUS | Status: AC
  Filled 2021-01-03: qty 100

## 2021-01-03 MED ORDER — DIPHENHYDRAMINE HCL 50 MG/ML IJ SOLN
12.5000 mg | Freq: Once | INTRAMUSCULAR | Status: AC
  Administered 2021-01-03: 12.5 mg via INTRAVENOUS

## 2021-01-03 MED ORDER — FAMOTIDINE 20 MG IN NS 100 ML IVPB
20.0000 mg | Freq: Once | INTRAVENOUS | Status: AC
  Administered 2021-01-03: 20 mg via INTRAVENOUS

## 2021-01-03 MED ORDER — SODIUM CHLORIDE 0.9% FLUSH
10.0000 mL | Freq: Once | INTRAVENOUS | Status: AC
Start: 2021-01-03 — End: 2021-01-03
  Administered 2021-01-03: 10 mL
  Filled 2021-01-03: qty 10

## 2021-01-03 MED ORDER — SODIUM CHLORIDE 0.9 % IV SOLN
Freq: Once | INTRAVENOUS | Status: AC
  Filled 2021-01-03: qty 250

## 2021-01-03 MED ORDER — DIPHENHYDRAMINE HCL 50 MG/ML IJ SOLN
INTRAMUSCULAR | Status: AC
  Filled 2021-01-03: qty 1

## 2021-01-03 MED ORDER — HEPARIN SOD (PORK) LOCK FLUSH 100 UNIT/ML IV SOLN
500.0000 [IU] | Freq: Once | INTRAVENOUS | Status: AC | PRN
  Administered 2021-01-03: 500 [IU]
  Filled 2021-01-03: qty 5

## 2021-01-03 MED ORDER — SODIUM CHLORIDE 0.9 % IV SOLN
80.0000 mg/m2 | Freq: Once | INTRAVENOUS | Status: AC
  Administered 2021-01-03: 174 mg via INTRAVENOUS
  Filled 2021-01-03: qty 29

## 2021-01-03 MED ORDER — SODIUM CHLORIDE 0.9% FLUSH
10.0000 mL | INTRAVENOUS | Status: DC | PRN
  Administered 2021-01-03: 10 mL
  Filled 2021-01-03: qty 10

## 2021-01-03 MED ORDER — SODIUM CHLORIDE 0.9 % IV SOLN
10.0000 mg | Freq: Once | INTRAVENOUS | Status: AC
  Administered 2021-01-03: 10 mg via INTRAVENOUS
  Filled 2021-01-03: qty 10

## 2021-01-03 NOTE — Patient Instructions (Signed)
[facility]  Discharge Instructions: Thank you for choosing [facility] to provide your oncology and hematology care.   If you have a lab appointment with the Cancer Center, please go directly to the Cancer Center and check in at the registration area.   Wear comfortable clothing and clothing appropriate for easy access to any Portacath or PICC line.   We strive to give you quality time with your provider. You may need to reschedule your appointment if you arrive late (15 or more minutes).  Arriving late affects you and other patients whose appointments are after yours.  Also, if you miss three or more appointments without notifying the office, you may be dismissed from the clinic at the provider's discretion.      For prescription refill requests, have your pharmacy contact our office and allow 72 hours for refills to be completed.    Today you received the following chemotherapy and/or immunotherapy agents taxol      To help prevent nausea and vomiting after your treatment, we encourage you to take your nausea medication as directed.  BELOW ARE SYMPTOMS THAT SHOULD BE REPORTED IMMEDIATELY: *FEVER GREATER THAN 100.4 F (38 C) OR HIGHER *CHILLS OR SWEATING *NAUSEA AND VOMITING THAT IS NOT CONTROLLED WITH YOUR NAUSEA MEDICATION *UNUSUAL SHORTNESS OF BREATH *UNUSUAL BRUISING OR BLEEDING *URINARY PROBLEMS (pain or burning when urinating, or frequent urination) *BOWEL PROBLEMS (unusual diarrhea, constipation, pain near the anus) TENDERNESS IN MOUTH AND THROAT WITH OR WITHOUT PRESENCE OF ULCERS (sore throat, sores in mouth, or a toothache) UNUSUAL RASH, SWELLING OR PAIN  UNUSUAL VAGINAL DISCHARGE OR ITCHING   Items with * indicate a potential emergency and should be followed up as soon as possible or go to the Emergency Department if any problems should occur.  Please show the CHEMOTHERAPY ALERT CARD or IMMUNOTHERAPY ALERT CARD at check-in to the  Emergency Department and triage nurse.  Should you have questions after your visit or need to cancel or reschedule your appointment, please contact [facility]  Dept: [phone]  and follow the prompts.  Office hours are 8:00 a.m. to 4:30 p.m. Monday - Friday. Please note that voicemails left after 4:00 p.m. may not be returned until the following business day.  We are closed weekends and major holidays. You have access to a nurse at all times for urgent questions. Please call the main number to the clinic Dept: [phone] and follow the prompts.   For any non-urgent questions, you may also contact your provider using MyChart. We now offer e-Visits for anyone 18 and older to request care online for non-urgent symptoms. For details visit [email or web address].   Also download the MyChart app! Go to the app store, search "MyChart", open the app, select , and log in with your MyChart username and password.  Due to Covid, a mask is required upon entering the hospital/clinic. If you do not have a mask, one will be given to you upon arrival. For doctor visits, patients may have 1 support person aged 18 or older with them. For treatment visits, patients cannot have anyone with them due to current Covid guidelines and our immunocompromised population.   

## 2021-01-03 NOTE — Progress Notes (Signed)
Per MD, OK to treat w/ mild neutropenia today. [place], RN aware.

## 2021-01-03 NOTE — Assessment & Plan Note (Signed)
She had recurrent PE despite improved disease control on recent imaging She will continue anticoagulation indefinitely She denies recent bleeding complication

## 2021-01-03 NOTE — Progress Notes (Signed)
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) This is due to treatment She is not symptomatic Observe closely  PE (pulmonary thromboembolism) ([place]) She had recurrent PE despite improved disease control on recent imaging She will continue anticoagulation indefinitely She denies recent bleeding complication  No orders of the defined types were placed in this encounter.   All questions were answered. The patient knows to call the clinic with any problems, questions or concerns. The total time spent in the appointment was 20 minutes encounter with patients including review of chart and various tests results, discussions about plan of care and coordination of care plan   [name], MD 01/03/2021 2:43 PM  INTERVAL HISTORY: Please see below for problem oriented charting. She returns for treatment and follow-up She tolerated treatment well No worsening neuropathy The patient denies any recent signs or symptoms of bleeding such as spontaneous epistaxis, hematuria or hematochezia. 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  She has bloating and early satiety. Shortness of breath with walking. She denied bleeding. She reported constipation with pain with defecation and narrowed stools    11/29/2018 Imaging   1. 13 cm complex cystic  lesion in the central pelvis, highly suspicious for ovarian cystadenocarcinoma. 2. Diffuse peritoneal carcinomatosis with mild ascites. 3. Mild lymphadenopathy in porta hepatis and right cardiophrenic angle, suspicious for metastatic disease. 4. Moderate right and tiny left pleural effusions    12/05/2018 Tumor Marker   Patient's tumor was tested for the following markers: CA-125 Results of the tumor marker test revealed 1015.    12/06/2018 Cancer Staging   Staging form: Ovary, Fallopian Tube, and Primary Peritoneal Carcinoma, AJCC 8th Edition - Clinical: FIGO Stage IVA, calculated as Stage IV (cT3c, cN1, cM1) - Signed by [name], MD on 12/06/2018    12/09/2018 Pathology Results   PLEURAL FLUID, RIGHT (SPECIMEN 1 OF 1 COLLECTED 12/09/18): - MALIGNANT CELLS CONSISTENT WITH METASTATIC ADENOCARCINOMA - SEE COMMENT Comment The neoplastic cells are positive for cytokeratin 7 and Pax-8 but negative for cytokeratin 20, TTF-1, CDX-2 and Gata-3. Overall, the phenotype is consistent with the clinical impression of gynecologic primary.    12/15/2018 Procedure   Placement of single lumen port a cath via right internal jugular vein. The catheter tip lies at the cavo-atrial junction. A power injectable port a cath was placed and is ready for immediate use   12/16/2018 - 08/24/2019 Chemotherapy   The patient had carboplatin and taxol for chemotherapy treatment.  She had 7 cycles given neoadjuvant prior to surgery and 3 more cycles after surgery, for a total of 10 cycles of treatment    01/06/2019 Tumor Marker   Patient's tumor was tested for the following markers: CA-125 Results of the tumor marker test revealed 948   02/02/2019 Imaging   1. Massive pulmonary embolism, as discussed above. Given the mildly elevated RV to LV ratio of 0.95, this is associated  with increased risk of morbidity and mortality. 2. Today's study demonstrates a mixed response to therapy. Specifically, while there has been  regression of the bulky intraperitoneal metastatic disease and regression of previously noted pleural effusions, the large cystic mass in the central pelvis has increased in size compared to the prior study. 3. New right mild hydroureteronephrosis related to extrinsic compression on the distal third of the right ureter by the patient's large pelvic mass. 4. Scattered small pulmonary nodules (predominantly pleural based) appear stable compared to prior examinations. These are nonspecific but warrant continued attention on follow-up studies. 5. Aortic atherosclerosis, in addition to three-vessel coronary artery disease. Assessment for potential risk factor modification, dietary therapy or pharmacologic therapy may be warranted, if clinically indicated.   02/02/2019 - 02/04/2019 Hospital Admission   She was admitted the hospital due to DVT and PE   02/03/2019 Imaging   Bilateral venous Doppler US Right: Findings consistent with acute deep vein thrombosis involving the right femoral vein, right popliteal vein, right peroneal veins, right soleal veins, and right gastrocnemius veins. No cystic structure found in the popliteal fossa. Left: There is no evidence of deep vein thrombosis in the lower extremity. No cystic structure found in the popliteal fossa   02/17/2019 Tumor Marker   Patient's tumor was tested for the following markers: CA-125 Results of the tumor marker test revealed 127   03/10/2019 Tumor Marker   Patient's tumor was tested for the following markers: CA-125. Results of the tumor marker test revealed 87.4   03/14/2019 - 03/16/2019 Hospital Admission   She was admitted to the hospital recently for weakness   03/31/2019 Tumor Marker   Patient's tumor was tested for the following markers: CA-125 Results of the tumor marker test revealed 59.2.   04/28/2019 Tumor Marker   Patient's tumor was tested for the following markers: CA-125 Results of the tumor marker test revealed 56.8   05/05/2019  Imaging   1. Interval decrease in size of the large cystic mass in the central pelvis. Mesenteric and omental soft tissue disease shows no substantial interval change. 2. The mild right hydroureteronephrosis seen previously has resolved in the interval. 3. Small residual nonobstructive thrombus identified in the inter lobar pulmonary artery common here into the lateral wall compatible with chronicity. 4. 14 mm subtle enhancing lesion in the anterior right liver is stable. This may be vascular malformation. Attention on follow-up recommended. 5. Stable appearance of the multiple small bilateral pulmonary nodules. Continued attention on follow-up recommended. 6.  Aortic Atherosclerois (ICD10-170.0)     06/06/2019 Pathology Results   A. OMENTUM, RESECTION: - Metastatic carcinoma. B. RIGHT FALLOPIAN TUBE AND OVARY, SALPINGOOOPHORECTOMY: - High-grade serous carcinoma, spanning 9 cm. - No surface involvement identified. - Fallopian tube involved by carcinoma. - See oncology table. C. PERITONEAL NODULE, EXCISION: - Metastatic carcinoma. ONCOLOGY TABLE: OVARY or FALLOPIAN TUBE or PRIMARY PERITONEUM: Procedure: Right salpingo-oophorectomy, omental resection, and peritoneal biopsy. Specimen Integrity: Intact Tumor Site: Right ovary Ovarian Surface Involvement (required only if applicable): Not identified Tumor Size: 9 cm Histologic Type: High-grade serous carcinoma Histologic Grade: High-grade Implants (required for advanced stage serous/seromucinous borderline tumors only): Omentum, peritoneum. Other Tissue/ Organ Involvement: Right fallopian tube Largest Extrapelvic Peritoneal Focus (required only if applicable): 7.3 cm Peritoneal/Ascitic Fluid: Positive pleural fluid pre neoadjuvant therapy. Treatment Effect (required only for high-grade serous carcinomas): Probable treatment effect present. Regional Lymph Nodes: No lymph nodes submitted or found Pathologic Stage Classification (pTNM, AJCC  8th Edition): ypT3c, ypNX Representative Tumor Block: B2  06/06/2019 Surgery   Preoperative Diagnosis: stage IV ovarian cancer, s/p neoadjuvant chemotherapy, history of recent PE.  Procedure(s) Performed: Exploratory laparotomy with right salpingo-oophorectomy, omentectomy radical tumor debulking for ovarian cancer .   Surgeon: Thereasa Solo, MD.    Operative Findings:  Omental cake adherent to anterior abdominal wall and hepatic flexure. 10cm right tube and ovary. Surgically absent uterus and left tube and ovary. Granular nodularity across right diaphragm.    This represented an optimal cytoreduction (R0) with no gross visible disease remaining.    07/06/2019 Tumor Marker   Patient's tumor was tested for the following markers:CA-125 Results of the tumor marker test revealed 16.4   08/10/2019 Genetic Testing   Negative genetic testing. No pathogenic variants identified. VUS in BRCA2 called [id number] identified on the [employer] CancerNext+RNAinsight panel. The report date is 08/10/2019.  The CancerNext+RNAinsight gene panel offered by [employer] [facility] includes sequencing and rearrangement analysis for the following 36 genes: APC*, ATM*, AXIN2, BARD1, BMPR1A, BRCA1*, BRCA2*, BRIP1*, CDH1*, CDK4, CDKN2A, CHEK2*, DICER1, MLH1*, MSH2*, MSH3, MSH6*, MUTYH*, NBN, NF1*, NTHL1, PALB2*, PMS2*, PTEN*, RAD51C*, RAD51D*, RECQL, SMAD4, SMARCA4, STK11 and TP53* (sequencing and deletion/duplication); HOXB13, POLD1 and POLE (sequencing only); EPCAM and GREM1 (deletion/duplication only). DNA and RNA analyses performed for * genes.   HRD (somatic) testing was initially ordered and was not completed due to insufficient amount of tumor sample.    08/24/2019 Tumor Marker   Patient's tumor was tested for the following markers: CA-125 Results of the tumor marker test revealed 12.2   09/20/2019 Imaging   1. Interval debulking, bilateral salpingo oophorectomy and omentectomy with resection of the dominant pelvic cystic lesion  and the bandlike soft tissue seen previously in the right mesentery/omentum. Today's study is status shows new postoperative baseline for follow-up. 2. 6 mm gastrohepatic ligament nodule seen on the previous study is stable. Continued attention on follow-up recommended. 3. No new or progressive findings in the chest, abdomen, or pelvis to suggest disease progression. 4. Stable 1.4 cm focus of homogeneous enhancement in the anterior right liver. Stable since at least 11/29/2018 and likely benign. Continued attention on follow-up recommended. 5. Aortic Atherosclerosis (ICD10-I70.0).   09/20/2019 Tumor Marker   Patient's tumor was tested for the following markers: CA-125 Results of the tumor marker test revealed 11.9   12/26/2019 Tumor Marker   Patient's tumor was tested for the following markers: CA-125 Results of the tumor marker test revealed 16.5   03/25/2020 Imaging   1. Interval progression of peritoneal disease which predominantly involves the serosal surface of the proximal and distal transverse colon, and descending colon. 2. New bilateral inguinal adenopathy. 3. No ascites. 4. Aortic atherosclerosis.   03/25/2020 Tumor Marker   Patient's tumor was tested for the following markers: CA-125 Results of the tumor marker test revealed 249   03/28/2020 Echocardiogram    1. Borderline LV Strain; A2C view represents the most accurate acquisition.. Left ventricular ejection fraction, by estimation, is 60 to 65%. Left ventricular ejection fraction by PLAX is 63 %. The left ventricle has normal function. The left ventricle demonstrates regional wall motion abnormalities (see scoring diagram/findings for description). Left ventricular diastolic parameters were normal.  2. Right ventricular systolic function is normal. The right ventricular size is normal.  3. Left atrial size was mildly dilated.  4. The mitral valve is normal in structure. No evidence of mitral valve regurgitation.  5. The aortic  valve is grossly normal. Aortic valve regurgitation is mild.  6. The inferior vena cava is normal  in size with greater than 50% respiratory variability, suggesting right atrial pressure of 3 mmHg.   04/02/2020 Tumor Marker   Patient's tumor was tested for the following markers: CA-125 Results of the tumor marker test revealed 304   04/02/2020 - 08/22/2020 Chemotherapy   The patient had carboplatin and Doxil for chemotherapy treatment.     05/27/2020 Tumor Marker   Patient's tumor was tested for the following markers: CA-125. Results of the tumor marker test revealed 42.7   06/10/2020 Imaging   Improving peritoneal disease and serosal implants along the left colon, as above.   Improving bilateral inguinal nodes.   06/18/2020 Echocardiogram    1. Left ventricular ejection fraction, by estimation, is 60 to 65%. The left ventricle has normal function. The left ventricle has no regional wall motion abnormalities. There is mild left ventricular hypertrophy of the basal-septal segment. Left ventricular diastolic parameters were normal. The average left ventricular global longitudinal strain is -25.0 %. The global longitudinal strain is normal.  2. Right ventricular systolic function is normal. The right ventricular size is normal.  3. The mitral valve is grossly normal. Mild mitral valve regurgitation.  4. Tricuspid valve regurgitation is moderate.  5. The aortic valve is tricuspid. There is mild calcification of the aortic valve. There is mild thickening of the aortic valve. Aortic valve regurgitation is mild to moderate.  6. The inferior vena cava is normal in size with <50% respiratory variability, suggesting right atrial pressure of 8 mmHg.   06/25/2020 Tumor Marker   Patient's tumor was tested for the following markers: CA-125 Results of the tumor marker test revealed 23.9   07/15/2020 Imaging   IMPRESSION: 1. Mild amount of acute pulmonary embolism within multiple middle lobe and lower  lobe branches of the right pulmonary artery. 2.   Small amount of chronic right pulmonary embolism. 3.   Mild posterior left basilar atelectasis and/or infiltrate. 4.   Small left pleural effusion. 5. Aortic atherosclerosis.   Aortic Atherosclerosis (ICD10-I70.0).     07/15/2020 - 07/17/2020 Hospital Admission   She was admitted to a local hospital after presentation with chest pain and shortness of breath and was found to have significant pulmonary embolism She was anticoagulated and discharged   07/16/2020 Echocardiogram   1. Left ventricular ejection fraction, by estimation, is 60 to 65%. The left ventricle has normal function. The left ventricle has no regional wall motion abnormalities. There is mild left ventricular hypertrophy.  Left ventricular diastolic parameters are indeterminate.   2. Right ventricular systolic function is normal. The right ventricular size is normal. There is normal pulmonary artery systolic pressure. The estimated right ventricular systolic pressure is 08.6 mmHg.   3. The mitral valve is grossly normal. Trivial mitral valve regurgitation.   4. The aortic valve is tricuspid. Aortic valve regurgitation is mild.   5. The inferior vena cava is normal in size with greater than 50% respiratory variability, suggesting right atrial pressure of 3 mmHg.    07/25/2020 Tumor Marker   Patient's tumor was tested for the following markers: CA-125 Results of the tumor marker test revealed 36.4   09/09/2020 Imaging   1. Mild improvement in peritoneal and serosal metastasis along the splenic flexure and descending colon. 2. Inguinal lymph nodes are slightly increased in size. Lymph nodes increased in size from 06/10/2020 and similar to CT of 03/25/2020. 3. No new peritoneal disease.  No free fluid   09/21/2020 - 10/21/2020 Chemotherapy   She started taking Niraparib, discontinued due to  intolerable side effects from headache and hypertension       10/01/2020 Tumor Marker    Patient's tumor was tested for the following markers: CA-125 Results of the tumor marker test revealed 83.4.   10/16/2020 Imaging   No acute findings in the abdomen or pelvis.   Hepatic steatosis.   Large stool burden throughout the colon.       12/03/2020 Tumor Marker   Patient's tumor was tested for the following markers: CA-125 Results of the tumor marker test revealed 146   12/20/2020 Imaging   1. Signs of peritoneal carcinomatosis. No significant interval change from previous exam. 2. No findings of solid organ metastasis or nodal metastasis within the abdomen or pelvis. Enlarged right inguinal node is mildly increased in size. Stable enlarged left inguinal node. 3. No evidence for metastatic disease to the chest. 4. Aortic atherosclerosis. Coronary artery calcifications.   12/27/2020 -  Chemotherapy    Patient is on Treatment Plan: OVARIAN PACLITAXEL D1,8,15, Q21 D         REVIEW OF SYSTEMS:   Constitutional: Denies fevers, chills or abnormal weight loss Eyes: Denies blurriness of vision Ears, nose, mouth, throat, and face: Denies mucositis or sore throat Respiratory: Denies cough, dyspnea or wheezes Cardiovascular: Denies palpitation, chest discomfort or lower extremity swelling Gastrointestinal:  Denies nausea, heartburn or change in bowel habits Skin: Denies abnormal skin rashes Lymphatics: Denies new lymphadenopathy or easy bruising Neurological:Denies numbness, tingling or new weaknesses Behavioral/Psych: Mood is stable, no new changes  All other systems were reviewed with the patient and are negative.  I have reviewed the past medical history, past surgical history, social history and family history with the patient and they are unchanged from previous note.  ALLERGIES:  has No Known Allergies.  MEDICATIONS:  Current Outpatient Medications  Medication Sig Dispense Refill   acetaminophen (TYLENOL) 325 MG tablet Take 650 mg by mouth every 6 (six) hours as needed  for moderate pain or headache.      amoxicillin (AMOXIL) 500 MG capsule Take 500 mg by mouth in the morning and at bedtime.     apixaban (ELIQUIS) 5 MG TABS tablet Take 1 tablet (5 mg total) by mouth 2 (two) times daily. --Start around 08/14/20 after you complete your initial starter pack 60 tablet 5   atenolol (TENORMIN) 25 MG tablet TAKE ONE (1) TABLET BY MOUTH EVERY DAY 30 tablet 1   cholecalciferol (VITAMIN D3) 25 MCG (1000 UNIT) tablet Take 1,000 Units by mouth daily.     levothyroxine (SYNTHROID) 75 MCG tablet Take 1 tablet (75 mcg total) by mouth daily before breakfast. 30 tablet 3   lidocaine-prilocaine (EMLA) cream Apply to affected area once (Patient taking differently: Apply 1 application topically daily as needed (port access).) 30 g 3   loratadine (CLARITIN) 10 MG tablet Take 10 mg by mouth daily.     Melatonin 3 MG TABS Take by mouth.     ondansetron (ZOFRAN) 8 MG tablet Take 8 mg by mouth every 8 (eight) hours as needed.     ondansetron (ZOFRAN) 8 MG tablet Take 1 tablet (8 mg total) by mouth 2 (two) times daily as needed (Nausea or vomiting). 30 tablet 1   pantoprazole (PROTONIX) 40 MG tablet Take 1 tablet (40 mg total) by mouth daily. 30 tablet 4   prochlorperazine (COMPAZINE) 10 MG tablet Take 10 mg by mouth every 6 (six) hours as needed.     prochlorperazine (COMPAZINE) 10 MG tablet Take 1 tablet (10 mg  total) by mouth every 6 (six) hours as needed (Nausea or vomiting). 30 tablet 1   senna-docusate (SENOKOT-S) 8.6-50 MG tablet Take 2 tablets by mouth at bedtime. 60 tablet 3   vitamin B-12 1000 MCG tablet Take 1 tablet (1,000 mcg total) by mouth daily. 30 tablet 3   No current facility-administered medications for this visit.   Facility-Administered Medications Ordered in Other Visits  Medication Dose Route Frequency Provider Last Rate Last Admin   heparin lock flush 100 unit/mL  500 Units Intracatheter Once PRN Alvy Bimler, Dawon Troop, MD       PACLitaxel (TAXOL) 174 mg in sodium  chloride 0.9 % 250 mL chemo infusion (</= 61m/m2)  80 mg/m2 (Treatment Plan Recorded) Intravenous Once GAlvy Bimler Reilly Molchan, MD       sodium chloride flush (NS) 0.9 % injection 10 mL  10 mL Intracatheter PRN GAlvy Bimler Sarahy Creedon, MD        PHYSICAL EXAMINATION: ECOG PERFORMANCE STATUS: 0 - Asymptomatic  Vitals:   01/03/21 1231  BP: (!) 137/55  Pulse: 64  Resp: 18  Temp: 98 F (36.7 C)  SpO2: 100%   Filed Weights   01/03/21 1231  Weight: 213 lb (96.6 kg)    GENERAL:alert, no distress and comfortable SKIN: skin color, texture, turgor are normal, no rashes or significant lesions EYES: normal, Conjunctiva are pink and non-injected, sclera clear OROPHARYNX:no exudate, no erythema and lips, buccal mucosa, and tongue normal  NECK: supple, thyroid normal size, non-tender, without nodularity LYMPH:  no palpable lymphadenopathy in the cervical, axillary or inguinal LUNGS: clear to auscultation and percussion with normal breathing effort HEART: regular rate & rhythm and no murmurs and no lower extremity edema ABDOMEN:abdomen soft, non-tender and normal bowel sounds Musculoskeletal:no cyanosis of digits and no clubbing  NEURO: alert & oriented x 3 with fluent speech, no focal motor/sensory deficits  LABORATORY DATA:  I have reviewed the data as listed    Component Value Date/Time   NA 140 01/03/2021 1214   K 3.9 01/03/2021 1214   CL 103 01/03/2021 1214   CO2 26 01/03/2021 1214   GLUCOSE 116 (H) 01/03/2021 1214   BUN 15 01/03/2021 1214   CREATININE 0.95 01/03/2021 1214   CALCIUM 9.5 01/03/2021 1214   PROT 6.9 01/03/2021 1214   ALBUMIN 3.4 (L) 01/03/2021 1214   AST 24 01/03/2021 1214   ALT 24 01/03/2021 1214   ALKPHOS 93 01/03/2021 1214   BILITOT 0.4 01/03/2021 1214   GFRNONAA >60 01/03/2021 1214   GFRAA >60 03/25/2020 1144    No results found for: SPEP, UPEP  Lab Results  Component Value Date   WBC 3.5 (L) 01/03/2021   NEUTROABS 1.3 (L) 01/03/2021   HGB 11.9 (L) 01/03/2021   HCT 34.2  (L) 01/03/2021   MCV 98.3 01/03/2021   PLT 193 01/03/2021      Chemistry      Component Value Date/Time   NA 140 01/03/2021 1214   K 3.9 01/03/2021 1214   CL 103 01/03/2021 1214   CO2 26 01/03/2021 1214   BUN 15 01/03/2021 1214   CREATININE 0.95 01/03/2021 1214      Component Value Date/Time   CALCIUM 9.5 01/03/2021 1214   ALKPHOS 93 01/03/2021 1214   AST 24 01/03/2021 1214   ALT 24 01/03/2021 1214   BILITOT 0.4 01/03/2021 1214

## 2021-01-03 NOTE — Patient Instructions (Signed)

## 2021-01-03 NOTE — Assessment & Plan Note (Signed)
This is due to treatment She is not symptomatic Observe closely 

## 2021-01-03 NOTE — Assessment & Plan Note (Signed)
So far, she tolerated treatment well except for mild pancytopenia We will proceed without delay I recommend minimum 3 cycles of treatment before repeat imaging study

## 2021-01-10 ENCOUNTER — Telehealth: Payer: Self-pay

## 2021-01-10 ENCOUNTER — Other Ambulatory Visit: Payer: Self-pay | Admitting: Hematology and Oncology

## 2021-01-10 DIAGNOSIS — N390 Urinary tract infection, site not specified: Secondary | ICD-10-CM

## 2021-01-10 MED ORDER — AMOXICILLIN 500 MG PO TABS: 500.0000 mg | ORAL_TABLET | Freq: Two times a day (BID) | ORAL | 0 refills | Status: DC

## 2021-01-10 NOTE — Telephone Encounter (Signed)
Patient notified, verbalized understanding and agreement.  Appointment created for 7/22.

## 2021-01-10 NOTE — Telephone Encounter (Signed)
Patient called stating, "I think I have a urinary tract infection."  Patient under active treatment for ovarian cancer.  S/P D8C1 Taxol.   Flank pain, dysuria, and odor to urine present X 2 days. Denies any fever.  Patient reports small amount of blood spotting.  States, "this is how I have been with these infections in the past."   RN encouraged patient to drink fluids and monitor for fever.  Will review with MD for recommendations for treatment.

## 2021-01-10 NOTE — Telephone Encounter (Signed)
I will send prescription antibitotics to the pharmacy I will order UA and UCx when she returns on Friday Tell patient to call back Monday to follow Add her to see me 7/22 morning

## 2021-01-13 ENCOUNTER — Telehealth: Payer: Self-pay

## 2021-01-13 NOTE — Telephone Encounter (Signed)
She called and left a message to give update. She is feeling better. UTI symptoms are better and she feels that it will resolve by Friday. She will call back if something changes.

## 2021-01-17 ENCOUNTER — Inpatient Hospital Stay: Payer: Medicare Other

## 2021-01-17 ENCOUNTER — Encounter: Payer: Self-pay | Admitting: Hematology and Oncology

## 2021-01-17 ENCOUNTER — Inpatient Hospital Stay: Payer: Medicare Other | Admitting: Hematology and Oncology

## 2021-01-17 ENCOUNTER — Other Ambulatory Visit: Payer: Self-pay

## 2021-01-17 DIAGNOSIS — D61818 Other pancytopenia: Secondary | ICD-10-CM | POA: Diagnosis not present

## 2021-01-17 DIAGNOSIS — Z9221 Personal history of antineoplastic chemotherapy: Secondary | ICD-10-CM | POA: Diagnosis not present

## 2021-01-17 DIAGNOSIS — C561 Malignant neoplasm of right ovary: Secondary | ICD-10-CM | POA: Diagnosis not present

## 2021-01-17 DIAGNOSIS — N183 Chronic kidney disease, stage 3 unspecified: Secondary | ICD-10-CM | POA: Diagnosis not present

## 2021-01-17 DIAGNOSIS — I2699 Other pulmonary embolism without acute cor pulmonale: Secondary | ICD-10-CM

## 2021-01-17 DIAGNOSIS — N390 Urinary tract infection, site not specified: Secondary | ICD-10-CM | POA: Diagnosis not present

## 2021-01-17 DIAGNOSIS — D702 Other drug-induced agranulocytosis: Secondary | ICD-10-CM | POA: Diagnosis not present

## 2021-01-17 DIAGNOSIS — Z7901 Long term (current) use of anticoagulants: Secondary | ICD-10-CM | POA: Diagnosis not present

## 2021-01-17 DIAGNOSIS — Z79899 Other long term (current) drug therapy: Secondary | ICD-10-CM | POA: Diagnosis not present

## 2021-01-17 DIAGNOSIS — Z5111 Encounter for antineoplastic chemotherapy: Secondary | ICD-10-CM | POA: Diagnosis not present

## 2021-01-17 DIAGNOSIS — Z7189 Other specified counseling: Secondary | ICD-10-CM

## 2021-01-17 DIAGNOSIS — C786 Secondary malignant neoplasm of retroperitoneum and peritoneum: Secondary | ICD-10-CM | POA: Diagnosis not present

## 2021-01-17 LAB — CBC WITH DIFFERENTIAL (CANCER CENTER ONLY)
Abs Immature Granulocytes: 0 10*3/uL (ref 0.00–0.07)
Basophils Absolute: 0 10*3/uL (ref 0.0–0.1)
Basophils Relative: 1 %
Eosinophils Absolute: 0 10*3/uL (ref 0.0–0.5)
Eosinophils Relative: 1 %
HCT: 33.5 % — ABNORMAL LOW (ref 36.0–46.0)
Hemoglobin: 11.4 g/dL — ABNORMAL LOW (ref 12.0–15.0)
Immature Granulocytes: 0 %
Lymphocytes Relative: 52 %
Lymphs Abs: 1.5 10*3/uL (ref 0.7–4.0)
MCH: 33.9 pg (ref 26.0–34.0)
MCHC: 34 g/dL (ref 30.0–36.0)
MCV: 99.7 fL (ref 80.0–100.0)
Monocytes Absolute: 0.4 10*3/uL (ref 0.1–1.0)
Monocytes Relative: 16 %
Neutro Abs: 0.9 10*3/uL — ABNORMAL LOW (ref 1.7–7.7)
Neutrophils Relative %: 30 %
Platelet Count: 237 10*3/uL (ref 150–400)
RBC: 3.36 MIL/uL — ABNORMAL LOW (ref 3.87–5.11)
RDW: 13.9 % (ref 11.5–15.5)
WBC Count: 2.8 10*3/uL — ABNORMAL LOW (ref 4.0–10.5)
nRBC: 0 % (ref 0.0–0.2)

## 2021-01-17 LAB — URINALYSIS, COMPLETE (UACMP) WITH MICROSCOPIC
Bacteria, UA: NONE SEEN
Bilirubin Urine: NEGATIVE
Glucose, UA: NEGATIVE mg/dL
Hgb urine dipstick: NEGATIVE
Ketones, ur: NEGATIVE mg/dL
Leukocytes,Ua: NEGATIVE
Nitrite: NEGATIVE
Protein, ur: NEGATIVE mg/dL
Specific Gravity, Urine: 1.01 (ref 1.005–1.030)
pH: 6 (ref 5.0–8.0)

## 2021-01-17 LAB — CMP (CANCER CENTER ONLY)
ALT: 27 U/L (ref 0–44)
AST: 24 U/L (ref 15–41)
Albumin: 3.8 g/dL (ref 3.5–5.0)
Alkaline Phosphatase: 84 U/L (ref 38–126)
Anion gap: 10 (ref 5–15)
BUN: 20 mg/dL (ref 8–23)
CO2: 28 mmol/L (ref 22–32)
Calcium: 9.9 mg/dL (ref 8.9–10.3)
Chloride: 101 mmol/L (ref 98–111)
Creatinine: 0.95 mg/dL (ref 0.44–1.00)
GFR, Estimated: >60 mL/min (ref >60)
Glucose, Bld: 97 mg/dL (ref 70–99)
Potassium: 4.2 mmol/L (ref 3.5–5.1)
Sodium: 139 mmol/L (ref 135–145)
Total Bilirubin: 0.4 mg/dL (ref 0.3–1.2)
Total Protein: 6.7 g/dL (ref 6.5–8.1)

## 2021-01-17 MED ORDER — SODIUM CHLORIDE 0.9% FLUSH
10.0000 mL | INTRAVENOUS | Status: DC | PRN
  Administered 2021-01-17: 10 mL
  Filled 2021-01-17: qty 10

## 2021-01-17 MED ORDER — SODIUM CHLORIDE 0.9 % IV SOLN
64.0000 mg/m2 | Freq: Once | INTRAVENOUS | Status: AC
  Administered 2021-01-17: 138 mg via INTRAVENOUS
  Filled 2021-01-17: qty 23

## 2021-01-17 MED ORDER — HEPARIN SOD (PORK) LOCK FLUSH 100 UNIT/ML IV SOLN
500.0000 [IU] | Freq: Once | INTRAVENOUS | Status: AC | PRN
  Administered 2021-01-17: 500 [IU]
  Filled 2021-01-17: qty 5

## 2021-01-17 MED ORDER — SODIUM CHLORIDE 0.9 % IV SOLN
10.0000 mg | Freq: Once | INTRAVENOUS | Status: AC
  Administered 2021-01-17: 10 mg via INTRAVENOUS
  Filled 2021-01-17: qty 10

## 2021-01-17 MED ORDER — HEPARIN SOD (PORK) LOCK FLUSH 100 UNIT/ML IV SOLN
500.0000 [IU] | Freq: Once | INTRAVENOUS | Status: DC
  Filled 2021-01-17: qty 5

## 2021-01-17 MED ORDER — DIPHENHYDRAMINE HCL 50 MG/ML IJ SOLN
INTRAMUSCULAR | Status: AC
  Filled 2021-01-17: qty 1

## 2021-01-17 MED ORDER — SODIUM CHLORIDE 0.9 % IV SOLN
Freq: Once | INTRAVENOUS | Status: AC
  Filled 2021-01-17: qty 250

## 2021-01-17 MED ORDER — SODIUM CHLORIDE 0.9% FLUSH
10.0000 mL | Freq: Once | INTRAVENOUS | Status: AC
Start: 2021-01-17 — End: 2021-01-17
  Administered 2021-01-17: 10 mL
  Filled 2021-01-17: qty 10

## 2021-01-17 MED ORDER — FAMOTIDINE 20 MG IN NS 100 ML IVPB
INTRAVENOUS | Status: AC
  Filled 2021-01-17: qty 100

## 2021-01-17 MED ORDER — FAMOTIDINE 20 MG IN NS 100 ML IVPB
20.0000 mg | Freq: Once | INTRAVENOUS | Status: AC
  Administered 2021-01-17: 20 mg via INTRAVENOUS

## 2021-01-17 MED ORDER — DIPHENHYDRAMINE HCL 50 MG/ML IJ SOLN
12.5000 mg | Freq: Once | INTRAMUSCULAR | Status: AC
  Administered 2021-01-17: 12.5 mg via INTRAVENOUS

## 2021-01-17 NOTE — Patient Instructions (Signed)

## 2021-01-17 NOTE — Assessment & Plan Note (Signed)
She tolerated single agent Taxol better Her urinary tract infection is resolving We will proceed with treatment

## 2021-01-17 NOTE — Patient Instructions (Signed)
[facility]  Discharge Instructions: Thank you for choosing [facility] to provide your oncology and hematology care.   If you have a lab appointment with the Cancer Center, please go directly to the Cancer Center and check in at the registration area.   Wear comfortable clothing and clothing appropriate for easy access to any Portacath or PICC line.   We strive to give you quality time with your provider. You may need to reschedule your appointment if you arrive late (15 or more minutes).  Arriving late affects you and other patients whose appointments are after yours.  Also, if you miss three or more appointments without notifying the office, you may be dismissed from the clinic at the provider's discretion.      For prescription refill requests, have your pharmacy contact our office and allow 72 hours for refills to be completed.    Today you received the following chemotherapy and/or immunotherapy agents taxol      To help prevent nausea and vomiting after your treatment, we encourage you to take your nausea medication as directed.  BELOW ARE SYMPTOMS THAT SHOULD BE REPORTED IMMEDIATELY: *FEVER GREATER THAN 100.4 F (38 C) OR HIGHER *CHILLS OR SWEATING *NAUSEA AND VOMITING THAT IS NOT CONTROLLED WITH YOUR NAUSEA MEDICATION *UNUSUAL SHORTNESS OF BREATH *UNUSUAL BRUISING OR BLEEDING *URINARY PROBLEMS (pain or burning when urinating, or frequent urination) *BOWEL PROBLEMS (unusual diarrhea, constipation, pain near the anus) TENDERNESS IN MOUTH AND THROAT WITH OR WITHOUT PRESENCE OF ULCERS (sore throat, sores in mouth, or a toothache) UNUSUAL RASH, SWELLING OR PAIN  UNUSUAL VAGINAL DISCHARGE OR ITCHING   Items with * indicate a potential emergency and should be followed up as soon as possible or go to the Emergency Department if any problems should occur.  Please show the CHEMOTHERAPY ALERT CARD or IMMUNOTHERAPY ALERT CARD at check-in to the  Emergency Department and triage nurse.  Should you have questions after your visit or need to cancel or reschedule your appointment, please contact [facility]  Dept: [phone]  and follow the prompts.  Office hours are 8:00 a.m. to 4:30 p.m. Monday - Friday. Please note that voicemails left after 4:00 p.m. may not be returned until the following business day.  We are closed weekends and major holidays. You have access to a nurse at all times for urgent questions. Please call the main number to the clinic Dept: [phone] and follow the prompts.   For any non-urgent questions, you may also contact your provider using MyChart. We now offer e-Visits for anyone 18 and older to request care online for non-urgent symptoms. For details visit [email or web address].   Also download the MyChart app! Go to the app store, search "MyChart", open the app, select [facility], and log in with your MyChart username and password.  Due to Covid, a mask is required upon entering the hospital/clinic. If you do not have a mask, one will be given to you upon arrival. For doctor visits, patients may have 1 support person aged 18 or older with them. For treatment visits, patients cannot have anyone with them due to current Covid guidelines and our immunocompromised population.   

## 2021-01-17 NOTE — Assessment & Plan Note (Signed)
Her UTI symptoms are resolving We will proceed with treatment without delay

## 2021-01-17 NOTE — Progress Notes (Signed)
OK to proceed w/o CMP today per Dr. Alvy Bimler.  Kennith Center, Pharm.D., CPP 01/17/2021'@12'$ :16 PM

## 2021-01-17 NOTE — Assessment & Plan Note (Signed)
She has minor hematuria from recent UTI but overall resolving She will continue her anticoagulation therapy

## 2021-01-17 NOTE — Progress Notes (Signed)
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   All questions were answered. The patient knows to call the clinic with any problems, questions or concerns. The total time spent in the appointment was 20 minutes encounter with patients including review of chart and various tests results, discussions about plan of care and coordination of care plan   [name], MD 01/17/2021 11:45 AM  INTERVAL HISTORY: Please see below for problem oriented charting. 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  She has bloating and early satiety. Shortness of breath with walking. She denied bleeding. She reported constipation with pain with defecation and narrowed stools    11/29/2018 Imaging   1. 13 cm complex cystic lesion in the central pelvis, highly  suspicious for ovarian cystadenocarcinoma. 2. Diffuse peritoneal carcinomatosis with mild ascites. 3. Mild lymphadenopathy in porta hepatis and right cardiophrenic angle, suspicious for metastatic disease. 4. Moderate right and tiny left pleural effusions    12/05/2018 Tumor Marker   Patient's tumor was tested for the following markers: CA-125 Results of the tumor marker test revealed 1015.    12/06/2018 Cancer Staging   Staging form: Ovary, Fallopian Tube, and Primary Peritoneal Carcinoma, AJCC 8th Edition - Clinical: FIGO Stage IVA, calculated as Stage IV (cT3c, cN1, cM1) - Signed by [name], MD on 12/06/2018    12/09/2018 Pathology Results   PLEURAL FLUID, RIGHT (SPECIMEN 1 OF 1 COLLECTED 12/09/18): - MALIGNANT CELLS CONSISTENT WITH METASTATIC ADENOCARCINOMA - SEE COMMENT Comment The neoplastic cells are positive for cytokeratin 7 and Pax-8 but negative for cytokeratin 20, TTF-1, CDX-2 and Gata-3. Overall, the phenotype is consistent with the clinical impression of gynecologic primary.    12/15/2018 Procedure   Placement of single lumen port a cath via right internal jugular vein. The catheter tip lies at the cavo-atrial junction. A power injectable port a cath was placed and is ready for immediate use   12/16/2018 - 08/24/2019 Chemotherapy   The patient had carboplatin and taxol for chemotherapy treatment.  She had 7 cycles given neoadjuvant prior to surgery and 3 more cycles after surgery, for a total of 10 cycles of treatment    01/06/2019 Tumor Marker   Patient's tumor was tested for the following markers: CA-125 Results of the tumor marker test revealed 948   02/02/2019 Imaging   1. Massive pulmonary embolism, as discussed above. Given the mildly elevated RV to LV ratio of 0.95, this is  associated with increased risk of morbidity and mortality. 2. Today's study demonstrates a mixed response to therapy. Specifically, while there has been regression of the bulky intraperitoneal  metastatic disease and regression of previously noted pleural effusions, the large cystic mass in the central pelvis has increased in size compared to the prior study. 3. New right mild hydroureteronephrosis related to extrinsic compression on the distal third of the right ureter by the patient's large pelvic mass. 4. Scattered small pulmonary nodules (predominantly pleural based) appear stable compared to prior examinations. These are nonspecific but warrant continued attention on follow-up studies. 5. Aortic atherosclerosis, in addition to three-vessel coronary artery disease. Assessment for potential risk factor modification, dietary therapy or pharmacologic therapy may be warranted, if clinically indicated.   02/02/2019 - 02/04/2019 Hospital Admission   She was admitted the hospital due to DVT and PE   02/03/2019 Imaging   Bilateral venous Doppler US Right: Findings consistent with acute deep vein thrombosis involving the right femoral vein, right popliteal vein, right peroneal veins, right soleal veins, and right gastrocnemius veins. No cystic structure found in the popliteal fossa. Left: There is no evidence of deep vein thrombosis in the lower extremity. No cystic structure found in the popliteal fossa   02/17/2019 Tumor Marker   Patient's tumor was tested for the following markers: CA-125 Results of the tumor marker test revealed 127   03/10/2019 Tumor Marker   Patient's tumor was tested for the following markers: CA-125. Results of the tumor marker test revealed 87.4   03/14/2019 - 03/16/2019 Hospital Admission   She was admitted to the hospital recently for weakness   03/31/2019 Tumor Marker   Patient's tumor was tested for the following markers: CA-125 Results of the tumor marker test revealed 59.2.   04/28/2019 Tumor Marker   Patient's tumor was tested for the following markers: CA-125 Results of the tumor marker test revealed 56.8   05/05/2019 Imaging   1. Interval decrease in size  of the large cystic mass in the central pelvis. Mesenteric and omental soft tissue disease shows no substantial interval change. 2. The mild right hydroureteronephrosis seen previously has resolved in the interval. 3. Small residual nonobstructive thrombus identified in the inter lobar pulmonary artery common here into the lateral wall compatible with chronicity. 4. 14 mm subtle enhancing lesion in the anterior right liver is stable. This may be vascular malformation. Attention on follow-up recommended. 5. Stable appearance of the multiple small bilateral pulmonary nodules. Continued attention on follow-up recommended. 6.  Aortic Atherosclerois (ICD10-170.0)     06/06/2019 Pathology Results   A. OMENTUM, RESECTION: - Metastatic carcinoma. B. RIGHT FALLOPIAN TUBE AND OVARY, SALPINGOOOPHORECTOMY: - High-grade serous carcinoma, spanning 9 cm. - No surface involvement identified. - Fallopian tube involved by carcinoma. - See oncology table. C. PERITONEAL NODULE, EXCISION: - Metastatic carcinoma. ONCOLOGY TABLE: OVARY or FALLOPIAN TUBE or PRIMARY PERITONEUM: Procedure: Right salpingo-oophorectomy, omental resection, and peritoneal biopsy. Specimen Integrity: Intact Tumor Site: Right ovary Ovarian Surface Involvement (required only if applicable): Not identified Tumor Size: 9 cm Histologic Type: High-grade serous carcinoma Histologic Grade: High-grade Implants (required for advanced stage serous/seromucinous borderline tumors only): Omentum, peritoneum. Other Tissue/ Organ Involvement: Right fallopian tube Largest Extrapelvic Peritoneal Focus (required only if applicable): 7.3 cm Peritoneal/Ascitic Fluid: Positive pleural fluid pre neoadjuvant therapy. Treatment Effect (required only for high-grade serous carcinomas): Probable treatment effect present. Regional Lymph Nodes: No lymph nodes submitted or found Pathologic Stage Classification (pTNM, AJCC 8th Edition): ypT3c,  ypNX Representative Tumor Block: B2  06/06/2019 Surgery   Preoperative Diagnosis: stage IV ovarian cancer, s/p neoadjuvant chemotherapy, history of recent PE.  Procedure(s) Performed: Exploratory laparotomy with right salpingo-oophorectomy, omentectomy radical tumor debulking for ovarian cancer .   Surgeon: Thereasa Solo, MD.    Operative Findings:  Omental cake adherent to anterior abdominal wall and hepatic flexure. 10cm right tube and ovary. Surgically absent uterus and left tube and ovary. Granular nodularity across right diaphragm.    This represented an optimal cytoreduction (R0) with no gross visible disease remaining.    07/06/2019 Tumor Marker   Patient's tumor was tested for the following markers:CA-125 Results of the tumor marker test revealed 16.4   08/10/2019 Genetic Testing   Negative genetic testing. No pathogenic variants identified. VUS in BRCA2 called [id number] identified on the [facility] CancerNext+RNAinsight panel. The report date is 08/10/2019.  The CancerNext+RNAinsight gene panel offered by [employer] includes sequencing and rearrangement analysis for the following 36 genes: APC*, ATM*, AXIN2, BARD1, BMPR1A, BRCA1*, BRCA2*, BRIP1*, CDH1*, CDK4, CDKN2A, CHEK2*, DICER1, MLH1*, MSH2*, MSH3, MSH6*, MUTYH*, NBN, NF1*, NTHL1, PALB2*, PMS2*, PTEN*, RAD51C*, RAD51D*, RECQL, SMAD4, SMARCA4, STK11 and TP53* (sequencing and deletion/duplication); HOXB13, POLD1 and POLE (sequencing only); EPCAM and GREM1 (deletion/duplication only). DNA and RNA analyses performed for * genes.   HRD (somatic) testing was initially ordered and was not completed due to insufficient amount of tumor sample.    08/24/2019 Tumor Marker   Patient's tumor was tested for the following markers: CA-125 Results of the tumor marker test revealed 12.2   09/20/2019 Imaging   1. Interval debulking, bilateral salpingo oophorectomy and omentectomy with resection of the dominant pelvic cystic lesion and the bandlike  soft tissue seen previously in the right mesentery/omentum. Today's study is status shows new postoperative baseline for follow-up. 2. 6 mm gastrohepatic ligament nodule seen on the previous study is stable. Continued attention on follow-up recommended. 3. No new or progressive findings in the chest, abdomen, or pelvis to suggest disease progression. 4. Stable 1.4 cm focus of homogeneous enhancement in the anterior right liver. Stable since at least 11/29/2018 and likely benign. Continued attention on follow-up recommended. 5. Aortic Atherosclerosis (ICD10-I70.0).   09/20/2019 Tumor Marker   Patient's tumor was tested for the following markers: CA-125 Results of the tumor marker test revealed 11.9   12/26/2019 Tumor Marker   Patient's tumor was tested for the following markers: CA-125 Results of the tumor marker test revealed 16.5   03/25/2020 Imaging   1. Interval progression of peritoneal disease which predominantly involves the serosal surface of the proximal and distal transverse colon, and descending colon. 2. New bilateral inguinal adenopathy. 3. No ascites. 4. Aortic atherosclerosis.   03/25/2020 Tumor Marker   Patient's tumor was tested for the following markers: CA-125 Results of the tumor marker test revealed 249   03/28/2020 Echocardiogram    1. Borderline LV Strain; A2C view represents the most accurate acquisition.. Left ventricular ejection fraction, by estimation, is 60 to 65%. Left ventricular ejection fraction by PLAX is 63 %. The left ventricle has normal function. The left ventricle demonstrates regional wall motion abnormalities (see scoring diagram/findings for description). Left ventricular diastolic parameters were normal.  2. Right ventricular systolic function is normal. The right ventricular size is normal.  3. Left atrial size was mildly dilated.  4. The mitral valve is normal in structure. No evidence of mitral valve regurgitation.  5. The aortic valve is grossly  normal. Aortic valve regurgitation is mild.  6. The inferior vena cava is normal  in size with greater than 50% respiratory variability, suggesting right atrial pressure of 3 mmHg.   04/02/2020 Tumor Marker   Patient's tumor was tested for the following markers: CA-125 Results of the tumor marker test revealed 304   04/02/2020 - 08/22/2020 Chemotherapy   The patient had carboplatin and Doxil for chemotherapy treatment.     05/27/2020 Tumor Marker   Patient's tumor was tested for the following markers: CA-125. Results of the tumor marker test revealed 42.7   06/10/2020 Imaging   Improving peritoneal disease and serosal implants along the left colon, as above.   Improving bilateral inguinal nodes.   06/18/2020 Echocardiogram    1. Left ventricular ejection fraction, by estimation, is 60 to 65%. The left ventricle has normal function. The left ventricle has no regional wall motion abnormalities. There is mild left ventricular hypertrophy of the basal-septal segment. Left ventricular diastolic parameters were normal. The average left ventricular global longitudinal strain is -25.0 %. The global longitudinal strain is normal.  2. Right ventricular systolic function is normal. The right ventricular size is normal.  3. The mitral valve is grossly normal. Mild mitral valve regurgitation.  4. Tricuspid valve regurgitation is moderate.  5. The aortic valve is tricuspid. There is mild calcification of the aortic valve. There is mild thickening of the aortic valve. Aortic valve regurgitation is mild to moderate.  6. The inferior vena cava is normal in size with <50% respiratory variability, suggesting right atrial pressure of 8 mmHg.   06/25/2020 Tumor Marker   Patient's tumor was tested for the following markers: CA-125 Results of the tumor marker test revealed 23.9   07/15/2020 Imaging   IMPRESSION: 1. Mild amount of acute pulmonary embolism within multiple middle lobe and lower lobe branches of  the right pulmonary artery. 2.   Small amount of chronic right pulmonary embolism. 3.   Mild posterior left basilar atelectasis and/or infiltrate. 4.   Small left pleural effusion. 5. Aortic atherosclerosis.   Aortic Atherosclerosis (ICD10-I70.0).     07/15/2020 - 07/17/2020 Hospital Admission   She was admitted to a local hospital after presentation with chest pain and shortness of breath and was found to have significant pulmonary embolism She was anticoagulated and discharged   07/16/2020 Echocardiogram   1. Left ventricular ejection fraction, by estimation, is 60 to 65%. The left ventricle has normal function. The left ventricle has no regional wall motion abnormalities. There is mild left ventricular hypertrophy.  Left ventricular diastolic parameters are indeterminate.   2. Right ventricular systolic function is normal. The right ventricular size is normal. There is normal pulmonary artery systolic pressure. The estimated right ventricular systolic pressure is 40.9 mmHg.   3. The mitral valve is grossly normal. Trivial mitral valve regurgitation.   4. The aortic valve is tricuspid. Aortic valve regurgitation is mild.   5. The inferior vena cava is normal in size with greater than 50% respiratory variability, suggesting right atrial pressure of 3 mmHg.    07/25/2020 Tumor Marker   Patient's tumor was tested for the following markers: CA-125 Results of the tumor marker test revealed 36.4   09/09/2020 Imaging   1. Mild improvement in peritoneal and serosal metastasis along the splenic flexure and descending colon. 2. Inguinal lymph nodes are slightly increased in size. Lymph nodes increased in size from 06/10/2020 and similar to CT of 03/25/2020. 3. No new peritoneal disease.  No free fluid   09/21/2020 - 10/21/2020 Chemotherapy   She started taking Niraparib, discontinued due to  intolerable side effects from headache and hypertension       10/01/2020 Tumor Marker   Patient's tumor was  tested for the following markers: CA-125 Results of the tumor marker test revealed 83.4.   10/16/2020 Imaging   No acute findings in the abdomen or pelvis.   Hepatic steatosis.   Large stool burden throughout the colon.       12/03/2020 Tumor Marker   Patient's tumor was tested for the following markers: CA-125 Results of the tumor marker test revealed 146   12/20/2020 Imaging   1. Signs of peritoneal carcinomatosis. No significant interval change from previous exam. 2. No findings of solid organ metastasis or nodal metastasis within the abdomen or pelvis. Enlarged right inguinal node is mildly increased in size. Stable enlarged left inguinal node. 3. No evidence for metastatic disease to the chest. 4. Aortic atherosclerosis. Coronary artery calcifications.   12/27/2020 -  Chemotherapy    Patient is on Treatment Plan: OVARIAN PACLITAXEL D1,8,15, Q21 D         REVIEW OF SYSTEMS:   Constitutional: Denies fevers, chills or abnormal weight loss Eyes: Denies blurriness of vision Ears, nose, mouth, throat, and face: Denies mucositis or sore throat Respiratory: Denies cough, dyspnea or wheezes Cardiovascular: Denies palpitation, chest discomfort or lower extremity swelling Gastrointestinal:  Denies nausea, heartburn or change in bowel habits Skin: Denies abnormal skin rashes Lymphatics: Denies new lymphadenopathy or easy bruising Neurological:Denies numbness, tingling or new weaknesses Behavioral/Psych: Mood is stable, no new changes  All other systems were reviewed with the patient and are negative.  I have reviewed the past medical history, past surgical history, social history and family history with the patient and they are unchanged from previous note.  ALLERGIES:  has No Known Allergies.  MEDICATIONS:  Current Outpatient Medications  Medication Sig Dispense Refill   acetaminophen (TYLENOL) 325 MG tablet Take 650 mg by mouth every 6 (six) hours as needed for moderate pain or  headache.      amoxicillin (AMOXIL) 500 MG capsule Take 500 mg by mouth in the morning and at bedtime.     amoxicillin (AMOXIL) 500 MG tablet Take 1 tablet (500 mg total) by mouth 2 (two) times daily. 14 tablet 0   apixaban (ELIQUIS) 5 MG TABS tablet Take 1 tablet (5 mg total) by mouth 2 (two) times daily. --Start around 08/14/20 after you complete your initial starter pack 60 tablet 5   atenolol (TENORMIN) 25 MG tablet TAKE ONE (1) TABLET BY MOUTH EVERY DAY 30 tablet 1   cholecalciferol (VITAMIN D3) 25 MCG (1000 UNIT) tablet Take 1,000 Units by mouth daily.     levothyroxine (SYNTHROID) 75 MCG tablet Take 1 tablet (75 mcg total) by mouth daily before breakfast. 30 tablet 3   lidocaine-prilocaine (EMLA) cream Apply to affected area once (Patient taking differently: Apply 1 application topically daily as needed (port access).) 30 g 3   loratadine (CLARITIN) 10 MG tablet Take 10 mg by mouth daily.     Melatonin 3 MG TABS Take by mouth.     ondansetron (ZOFRAN) 8 MG tablet Take 8 mg by mouth every 8 (eight) hours as needed.     ondansetron (ZOFRAN) 8 MG tablet Take 1 tablet (8 mg total) by mouth 2 (two) times daily as needed (Nausea or vomiting). 30 tablet 1   pantoprazole (PROTONIX) 40 MG tablet Take 1 tablet (40 mg total) by mouth daily. 30 tablet 4   prochlorperazine (COMPAZINE) 10 MG tablet Take 10 mg  by mouth every 6 (six) hours as needed.     prochlorperazine (COMPAZINE) 10 MG tablet Take 1 tablet (10 mg total) by mouth every 6 (six) hours as needed (Nausea or vomiting). 30 tablet 1   senna-docusate (SENOKOT-S) 8.6-50 MG tablet Take 2 tablets by mouth at bedtime. 60 tablet 3   vitamin B-12 1000 MCG tablet Take 1 tablet (1,000 mcg total) by mouth daily. 30 tablet 3   No current facility-administered medications for this visit.    PHYSICAL EXAMINATION: ECOG PERFORMANCE STATUS: 1 - Symptomatic but completely ambulatory  Vitals:   01/17/21 1131  BP: 122/69  Pulse: 65  Resp: 18  Temp: (!)  97.4 F (36.3 C)  SpO2: 97%   Filed Weights   01/17/21 1131  Weight: 214 lb 9.6 oz (97.3 kg)    GENERAL:alert, no distress and comfortable SKIN: skin color, texture, turgor are normal, no rashes or significant lesions EYES: normal, Conjunctiva are pink and non-injected, sclera clear OROPHARYNX:no exudate, no erythema and lips, buccal mucosa, and tongue normal  NECK: supple, thyroid normal size, non-tender, without nodularity LYMPH:  no palpable lymphadenopathy in the cervical, axillary or inguinal LUNGS: clear to auscultation and percussion with normal breathing effort HEART: regular rate & rhythm and no murmurs and no lower extremity edema ABDOMEN:abdomen soft, mild discomfort at the suprapubic region Musculoskeletal:no cyanosis of digits and no clubbing  NEURO: alert & oriented x 3 with fluent speech, no focal motor/sensory deficits  LABORATORY DATA:  I have reviewed the data as listed    Component Value Date/Time   NA 140 01/03/2021 1214   K 3.9 01/03/2021 1214   CL 103 01/03/2021 1214   CO2 26 01/03/2021 1214   GLUCOSE 116 (H) 01/03/2021 1214   BUN 15 01/03/2021 1214   CREATININE 0.95 01/03/2021 1214   CALCIUM 9.5 01/03/2021 1214   PROT 6.9 01/03/2021 1214   ALBUMIN 3.4 (L) 01/03/2021 1214   AST 24 01/03/2021 1214   ALT 24 01/03/2021 1214   ALKPHOS 93 01/03/2021 1214   BILITOT 0.4 01/03/2021 1214   GFRNONAA >60 01/03/2021 1214   GFRAA >60 03/25/2020 1144    No results found for: SPEP, UPEP  Lab Results  Component Value Date   WBC 2.8 (L) 01/17/2021   NEUTROABS 0.9 (L) 01/17/2021   HGB 11.4 (L) 01/17/2021   HCT 33.5 (L) 01/17/2021   MCV 99.7 01/17/2021   PLT 237 01/17/2021      Chemistry      Component Value Date/Time   NA 140 01/03/2021 1214   K 3.9 01/03/2021 1214   CL 103 01/03/2021 1214   CO2 26 01/03/2021 1214   BUN 15 01/03/2021 1214   CREATININE 0.95 01/03/2021 1214      Component Value Date/Time   CALCIUM 9.5 01/03/2021 1214   ALKPHOS 93  01/03/2021 1214   AST 24 01/03/2021 1214   ALT 24 01/03/2021 1214   BILITOT 0.4 01/03/2021 1214

## 2021-01-17 NOTE — Assessment & Plan Note (Signed)
I plan minor dose adjustment moving forward She does not need G-CSF support

## 2021-01-19 LAB — URINE CULTURE: Culture: NO GROWTH

## 2021-01-23 ENCOUNTER — Other Ambulatory Visit: Payer: Self-pay | Admitting: Hematology and Oncology

## 2021-01-24 ENCOUNTER — Inpatient Hospital Stay: Payer: Medicare Other | Admitting: Hematology and Oncology

## 2021-01-24 ENCOUNTER — Other Ambulatory Visit: Payer: Self-pay

## 2021-01-24 ENCOUNTER — Encounter: Payer: Self-pay | Admitting: Hematology and Oncology

## 2021-01-24 ENCOUNTER — Inpatient Hospital Stay: Payer: Medicare Other

## 2021-01-24 DIAGNOSIS — N183 Chronic kidney disease, stage 3 unspecified: Secondary | ICD-10-CM | POA: Diagnosis not present

## 2021-01-24 DIAGNOSIS — C561 Malignant neoplasm of right ovary: Secondary | ICD-10-CM

## 2021-01-24 DIAGNOSIS — Z5111 Encounter for antineoplastic chemotherapy: Secondary | ICD-10-CM | POA: Diagnosis not present

## 2021-01-24 DIAGNOSIS — I2699 Other pulmonary embolism without acute cor pulmonale: Secondary | ICD-10-CM | POA: Diagnosis not present

## 2021-01-24 DIAGNOSIS — Z7189 Other specified counseling: Secondary | ICD-10-CM

## 2021-01-24 DIAGNOSIS — Z79899 Other long term (current) drug therapy: Secondary | ICD-10-CM | POA: Diagnosis not present

## 2021-01-24 DIAGNOSIS — D61818 Other pancytopenia: Secondary | ICD-10-CM | POA: Diagnosis not present

## 2021-01-24 DIAGNOSIS — Z7901 Long term (current) use of anticoagulants: Secondary | ICD-10-CM | POA: Diagnosis not present

## 2021-01-24 DIAGNOSIS — Z9221 Personal history of antineoplastic chemotherapy: Secondary | ICD-10-CM | POA: Diagnosis not present

## 2021-01-24 DIAGNOSIS — C786 Secondary malignant neoplasm of retroperitoneum and peritoneum: Secondary | ICD-10-CM | POA: Diagnosis not present

## 2021-01-24 LAB — CBC WITH DIFFERENTIAL (CANCER CENTER ONLY)
Abs Immature Granulocytes: 0.02 10*3/uL (ref 0.00–0.07)
Basophils Absolute: 0 10*3/uL (ref 0.0–0.1)
Basophils Relative: 1 %
Eosinophils Absolute: 0 10*3/uL (ref 0.0–0.5)
Eosinophils Relative: 1 %
HCT: 33 % — ABNORMAL LOW (ref 36.0–46.0)
Hemoglobin: 11.4 g/dL — ABNORMAL LOW (ref 12.0–15.0)
Immature Granulocytes: 1 %
Lymphocytes Relative: 47 %
Lymphs Abs: 1.6 10*3/uL (ref 0.7–4.0)
MCH: 34.5 pg — ABNORMAL HIGH (ref 26.0–34.0)
MCHC: 34.5 g/dL (ref 30.0–36.0)
MCV: 100 fL (ref 80.0–100.0)
Monocytes Absolute: 0.3 10*3/uL (ref 0.1–1.0)
Monocytes Relative: 10 %
Neutro Abs: 1.4 10*3/uL — ABNORMAL LOW (ref 1.7–7.7)
Neutrophils Relative %: 40 %
Platelet Count: 237 10*3/uL (ref 150–400)
RBC: 3.3 MIL/uL — ABNORMAL LOW (ref 3.87–5.11)
RDW: 13.8 % (ref 11.5–15.5)
WBC Count: 3.4 10*3/uL — ABNORMAL LOW (ref 4.0–10.5)
nRBC: 0.9 % — ABNORMAL HIGH (ref 0.0–0.2)

## 2021-01-24 LAB — CMP (CANCER CENTER ONLY)
ALT: 22 U/L (ref 0–44)
AST: 20 U/L (ref 15–41)
Albumin: 3.5 g/dL (ref 3.5–5.0)
Alkaline Phosphatase: 102 U/L (ref 38–126)
Anion gap: 9 (ref 5–15)
BUN: 17 mg/dL (ref 8–23)
CO2: 25 mmol/L (ref 22–32)
Calcium: 9.6 mg/dL (ref 8.9–10.3)
Chloride: 104 mmol/L (ref 98–111)
Creatinine: 0.96 mg/dL (ref 0.44–1.00)
GFR, Estimated: >60 mL/min (ref >60)
Glucose, Bld: 115 mg/dL — ABNORMAL HIGH (ref 70–99)
Potassium: 4.1 mmol/L (ref 3.5–5.1)
Sodium: 138 mmol/L (ref 135–145)
Total Bilirubin: 0.3 mg/dL (ref 0.3–1.2)
Total Protein: 7 g/dL (ref 6.5–8.1)

## 2021-01-24 MED ORDER — SODIUM CHLORIDE 0.9 % IV SOLN
64.0000 mg/m2 | Freq: Once | INTRAVENOUS | Status: AC
  Administered 2021-01-24: 138 mg via INTRAVENOUS
  Filled 2021-01-24: qty 23

## 2021-01-24 MED ORDER — DIPHENHYDRAMINE HCL 50 MG/ML IJ SOLN
12.5000 mg | Freq: Once | INTRAMUSCULAR | Status: AC
  Administered 2021-01-24: 12.5 mg via INTRAVENOUS

## 2021-01-24 MED ORDER — SODIUM CHLORIDE 0.9 % IV SOLN
Freq: Once | INTRAVENOUS | Status: AC
  Filled 2021-01-24: qty 250

## 2021-01-24 MED ORDER — HEPARIN SOD (PORK) LOCK FLUSH 100 UNIT/ML IV SOLN
500.0000 [IU] | Freq: Once | INTRAVENOUS | Status: AC | PRN
  Administered 2021-01-24: 500 [IU]
  Filled 2021-01-24: qty 5

## 2021-01-24 MED ORDER — SODIUM CHLORIDE 0.9% FLUSH
10.0000 mL | Freq: Once | INTRAVENOUS | Status: AC
  Administered 2021-01-24: 10 mL
  Filled 2021-01-24: qty 10

## 2021-01-24 MED ORDER — DIPHENHYDRAMINE HCL 50 MG/ML IJ SOLN
INTRAMUSCULAR | Status: AC
  Filled 2021-01-24: qty 1

## 2021-01-24 MED ORDER — SODIUM CHLORIDE 0.9% FLUSH
10.0000 mL | INTRAVENOUS | Status: DC | PRN
  Administered 2021-01-24: 10 mL
  Filled 2021-01-24: qty 10

## 2021-01-24 MED ORDER — FAMOTIDINE 20 MG IN NS 100 ML IVPB
INTRAVENOUS | Status: AC
  Filled 2021-01-24: qty 100

## 2021-01-24 MED ORDER — SODIUM CHLORIDE 0.9 % IV SOLN
10.0000 mg | Freq: Once | INTRAVENOUS | Status: AC
  Administered 2021-01-24: 10 mg via INTRAVENOUS
  Filled 2021-01-24: qty 10

## 2021-01-24 MED ORDER — FAMOTIDINE 20 MG IN NS 100 ML IVPB
20.0000 mg | Freq: Once | INTRAVENOUS | Status: AC
  Administered 2021-01-24: 20 mg via INTRAVENOUS

## 2021-01-24 NOTE — Assessment & Plan Note (Signed)
She has minor hematuria from recent UTI but overall resolving She will continue her anticoagulation therapy

## 2021-01-24 NOTE — Assessment & Plan Note (Signed)
She tolerated single agent Taxol better Her urinary tract infection is resolved We will proceed with treatment

## 2021-01-24 NOTE — Assessment & Plan Note (Signed)
This is due to treatment She is not symptomatic Observe closely 

## 2021-01-24 NOTE — Progress Notes (Signed)
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) This is due to treatment She is not symptomatic Observe closely  PE (pulmonary thromboembolism) ([place]) She has minor hematuria from recent UTI but overall resolving She will continue her anticoagulation therapy  No orders of the defined types were placed in this encounter.   All questions were answered. The patient knows to call the clinic with any problems, questions or concerns. The total time spent in the appointment was 20 minutes encounter with patients including review of chart and various tests results, discussions about plan of care and coordination of care plan   [name], MD 01/24/2021 10:47 AM  INTERVAL HISTORY: Please see below for problem oriented charting. 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  She has bloating and early satiety. Shortness of breath with walking. She denied bleeding. She reported constipation with pain with defecation and narrowed stools    11/29/2018 Imaging   1. 13 cm complex cystic lesion in the central pelvis, highly suspicious for ovarian cystadenocarcinoma. 2. Diffuse peritoneal carcinomatosis with mild ascites. 3. Mild lymphadenopathy in porta hepatis and right cardiophrenic angle, suspicious for  metastatic disease. 4. Moderate right and tiny left pleural effusions    12/05/2018 Tumor Marker   Patient's tumor was tested for the following markers: CA-125 Results of the tumor marker test revealed 1015.    12/06/2018 Cancer Staging   Staging form: Ovary, Fallopian Tube, and Primary Peritoneal Carcinoma, AJCC 8th Edition - Clinical: FIGO Stage IVA, calculated as Stage IV (cT3c, cN1, cM1) - Signed by [name], MD on 12/06/2018    12/09/2018 Pathology Results   PLEURAL FLUID, RIGHT (SPECIMEN 1 OF 1 COLLECTED 12/09/18): - MALIGNANT CELLS CONSISTENT WITH METASTATIC ADENOCARCINOMA - SEE COMMENT Comment The neoplastic cells are positive for cytokeratin 7 and Pax-8 but negative for cytokeratin 20, TTF-1, CDX-2 and Gata-3. Overall, the phenotype is consistent with the clinical impression of gynecologic primary.    12/15/2018 Procedure   Placement of single lumen port a cath via right internal jugular vein. The catheter tip lies at the cavo-atrial junction. A power injectable port a cath was placed and is ready for immediate use   12/16/2018 - 08/24/2019 Chemotherapy   The patient had carboplatin and taxol for chemotherapy treatment.  She had 7 cycles given neoadjuvant prior to surgery and 3 more cycles after surgery, for a total of 10 cycles of treatment    01/06/2019 Tumor Marker   Patient's tumor was tested for the following markers: CA-125 Results of the tumor marker test revealed 948   02/02/2019 Imaging   1. Massive pulmonary embolism, as discussed above. Given the mildly elevated RV to LV ratio of 0.95, this is associated with increased risk of morbidity and mortality. 2. Today's study demonstrates a mixed response to therapy. Specifically, while there has been regression of the bulky intraperitoneal metastatic disease and regression of previously  noted pleural effusions, the large cystic mass in the central pelvis has increased in size compared to the prior study. 3. New right mild  hydroureteronephrosis related to extrinsic compression on the distal third of the right ureter by the patient's large pelvic mass. 4. Scattered small pulmonary nodules (predominantly pleural based) appear stable compared to prior examinations. These are nonspecific but warrant continued attention on follow-up studies. 5. Aortic atherosclerosis, in addition to three-vessel coronary artery disease. Assessment for potential risk factor modification, dietary therapy or pharmacologic therapy may be warranted, if clinically indicated.   02/02/2019 - 02/04/2019 Hospital Admission   She was admitted the hospital due to DVT and PE   02/03/2019 Imaging   Bilateral venous Doppler US Right: Findings consistent with acute deep vein thrombosis involving the right femoral vein, right popliteal vein, right peroneal veins, right soleal veins, and right gastrocnemius veins. No cystic structure found in the popliteal fossa. Left: There is no evidence of deep vein thrombosis in the lower extremity. No cystic structure found in the popliteal fossa   02/17/2019 Tumor Marker   Patient's tumor was tested for the following markers: CA-125 Results of the tumor marker test revealed 127   03/10/2019 Tumor Marker   Patient's tumor was tested for the following markers: CA-125. Results of the tumor marker test revealed 87.4   03/14/2019 - 03/16/2019 Hospital Admission   She was admitted to the hospital recently for weakness   03/31/2019 Tumor Marker   Patient's tumor was tested for the following markers: CA-125 Results of the tumor marker test revealed 59.2.   04/28/2019 Tumor Marker   Patient's tumor was tested for the following markers: CA-125 Results of the tumor marker test revealed 56.8   05/05/2019 Imaging   1. Interval decrease in size of the large cystic mass in the central pelvis. Mesenteric and omental soft tissue disease shows no substantial interval change. 2. The mild right hydroureteronephrosis seen previously  has resolved in the interval. 3. Small residual nonobstructive thrombus identified in the inter lobar pulmonary artery common here into the lateral wall compatible with chronicity. 4. 14 mm subtle enhancing lesion in the anterior right liver is stable. This may be vascular malformation. Attention on follow-up recommended. 5. Stable appearance of the multiple small bilateral pulmonary nodules. Continued attention on follow-up recommended. 6.  Aortic Atherosclerois (ICD10-170.0)     06/06/2019 Pathology Results   A. OMENTUM, RESECTION: - Metastatic carcinoma. B. RIGHT FALLOPIAN TUBE AND OVARY, SALPINGOOOPHORECTOMY: - High-grade serous carcinoma, spanning 9 cm. - No surface involvement identified. - Fallopian tube involved by carcinoma. - See oncology table. C. PERITONEAL NODULE, EXCISION: - Metastatic carcinoma. ONCOLOGY TABLE: OVARY or FALLOPIAN TUBE or PRIMARY PERITONEUM: Procedure: Right salpingo-oophorectomy, omental resection, and peritoneal biopsy. Specimen Integrity: Intact Tumor Site: Right ovary Ovarian Surface Involvement (required only if applicable): Not identified Tumor Size: 9 cm Histologic Type: High-grade serous carcinoma Histologic Grade: High-grade Implants (required for advanced stage serous/seromucinous borderline tumors only): Omentum, peritoneum. Other Tissue/ Organ Involvement: Right fallopian tube Largest Extrapelvic Peritoneal Focus (required only if applicable): 7.3 cm Peritoneal/Ascitic Fluid: Positive pleural fluid pre neoadjuvant therapy. Treatment Effect (required only for high-grade serous carcinomas): Probable treatment effect present. Regional Lymph Nodes: No lymph nodes submitted or found Pathologic Stage Classification (pTNM, AJCC 8th Edition): ypT3c, ypNX Representative Tumor Block: B2   06/06/2019 Surgery   Preoperative Diagnosis: stage IV ovarian cancer, s/p neoadjuvant chemotherapy, history of recent PE.  Procedure(s) Performed: Exploratory  laparotomy with right salpingo-oophorectomy, omentectomy radical tumor debulking for ovarian  cancer .   Surgeon: Thereasa Solo, MD.    Operative Findings:  Omental cake adherent to anterior abdominal wall and hepatic flexure. 10cm right tube and ovary. Surgically absent uterus and left tube and ovary. Granular nodularity across right diaphragm.    This represented an optimal cytoreduction (R0) with no gross visible disease remaining.    07/06/2019 Tumor Marker   Patient's tumor was tested for the following markers:CA-125 Results of the tumor marker test revealed 16.4   08/10/2019 Genetic Testing   Negative genetic testing. No pathogenic variants identified. VUS in BRCA2 called [id number] identified on the [employer] CancerNext+RNAinsight panel. The report date is 08/10/2019.  The CancerNext+RNAinsight gene panel offered by [employer] [facility] includes sequencing and rearrangement analysis for the following 36 genes: APC*, ATM*, AXIN2, BARD1, BMPR1A, BRCA1*, BRCA2*, BRIP1*, CDH1*, CDK4, CDKN2A, CHEK2*, DICER1, MLH1*, MSH2*, MSH3, MSH6*, MUTYH*, NBN, NF1*, NTHL1, PALB2*, PMS2*, PTEN*, RAD51C*, RAD51D*, RECQL, SMAD4, SMARCA4, STK11 and TP53* (sequencing and deletion/duplication); HOXB13, POLD1 and POLE (sequencing only); EPCAM and GREM1 (deletion/duplication only). DNA and RNA analyses performed for * genes.   HRD (somatic) testing was initially ordered and was not completed due to insufficient amount of tumor sample.    08/24/2019 Tumor Marker   Patient's tumor was tested for the following markers: CA-125 Results of the tumor marker test revealed 12.2   09/20/2019 Imaging   1. Interval debulking, bilateral salpingo oophorectomy and omentectomy with resection of the dominant pelvic cystic lesion and the bandlike soft tissue seen previously in the right mesentery/omentum. Today's study is status shows new postoperative baseline for follow-up. 2. 6 mm gastrohepatic ligament nodule seen on the previous study is  stable. Continued attention on follow-up recommended. 3. No new or progressive findings in the chest, abdomen, or pelvis to suggest disease progression. 4. Stable 1.4 cm focus of homogeneous enhancement in the anterior right liver. Stable since at least 11/29/2018 and likely benign. Continued attention on follow-up recommended. 5. Aortic Atherosclerosis (ICD10-I70.0).   09/20/2019 Tumor Marker   Patient's tumor was tested for the following markers: CA-125 Results of the tumor marker test revealed 11.9   12/26/2019 Tumor Marker   Patient's tumor was tested for the following markers: CA-125 Results of the tumor marker test revealed 16.5   03/25/2020 Imaging   1. Interval progression of peritoneal disease which predominantly involves the serosal surface of the proximal and distal transverse colon, and descending colon. 2. New bilateral inguinal adenopathy. 3. No ascites. 4. Aortic atherosclerosis.   03/25/2020 Tumor Marker   Patient's tumor was tested for the following markers: CA-125 Results of the tumor marker test revealed 249   03/28/2020 Echocardiogram    1. Borderline LV Strain; A2C view represents the most accurate acquisition.. Left ventricular ejection fraction, by estimation, is 60 to 65%. Left ventricular ejection fraction by PLAX is 63 %. The left ventricle has normal function. The left ventricle demonstrates regional wall motion abnormalities (see scoring diagram/findings for description). Left ventricular diastolic parameters were normal.  2. Right ventricular systolic function is normal. The right ventricular size is normal.  3. Left atrial size was mildly dilated.  4. The mitral valve is normal in structure. No evidence of mitral valve regurgitation.  5. The aortic valve is grossly normal. Aortic valve regurgitation is mild.  6. The inferior vena cava is normal in size with greater than 50% respiratory variability, suggesting right atrial pressure of 3 mmHg.   04/02/2020 Tumor  Marker   Patient's tumor was tested for the following markers: CA-125  Results of the tumor marker test revealed 304   04/02/2020 - 08/22/2020 Chemotherapy   The patient had carboplatin and Doxil for chemotherapy treatment.     05/27/2020 Tumor Marker   Patient's tumor was tested for the following markers: CA-125. Results of the tumor marker test revealed 42.7   06/10/2020 Imaging   Improving peritoneal disease and serosal implants along the left colon, as above.   Improving bilateral inguinal nodes.   06/18/2020 Echocardiogram    1. Left ventricular ejection fraction, by estimation, is 60 to 65%. The left ventricle has normal function. The left ventricle has no regional wall motion abnormalities. There is mild left ventricular hypertrophy of the basal-septal segment. Left ventricular diastolic parameters were normal. The average left ventricular global longitudinal strain is -25.0 %. The global longitudinal strain is normal.  2. Right ventricular systolic function is normal. The right ventricular size is normal.  3. The mitral valve is grossly normal. Mild mitral valve regurgitation.  4. Tricuspid valve regurgitation is moderate.  5. The aortic valve is tricuspid. There is mild calcification of the aortic valve. There is mild thickening of the aortic valve. Aortic valve regurgitation is mild to moderate.  6. The inferior vena cava is normal in size with <50% respiratory variability, suggesting right atrial pressure of 8 mmHg.   06/25/2020 Tumor Marker   Patient's tumor was tested for the following markers: CA-125 Results of the tumor marker test revealed 23.9   07/15/2020 Imaging   IMPRESSION: 1. Mild amount of acute pulmonary embolism within multiple middle lobe and lower lobe branches of the right pulmonary artery. 2.   Small amount of chronic right pulmonary embolism. 3.   Mild posterior left basilar atelectasis and/or infiltrate. 4.   Small left pleural effusion. 5. Aortic  atherosclerosis.   Aortic Atherosclerosis (ICD10-I70.0).     07/15/2020 - 07/17/2020 Hospital Admission   She was admitted to a local hospital after presentation with chest pain and shortness of breath and was found to have significant pulmonary embolism She was anticoagulated and discharged   07/16/2020 Echocardiogram   1. Left ventricular ejection fraction, by estimation, is 60 to 65%. The left ventricle has normal function. The left ventricle has no regional wall motion abnormalities. There is mild left ventricular hypertrophy.  Left ventricular diastolic parameters are indeterminate.   2. Right ventricular systolic function is normal. The right ventricular size is normal. There is normal pulmonary artery systolic pressure. The estimated right ventricular systolic pressure is 61.4 mmHg.   3. The mitral valve is grossly normal. Trivial mitral valve regurgitation.   4. The aortic valve is tricuspid. Aortic valve regurgitation is mild.   5. The inferior vena cava is normal in size with greater than 50% respiratory variability, suggesting right atrial pressure of 3 mmHg.    07/25/2020 Tumor Marker   Patient's tumor was tested for the following markers: CA-125 Results of the tumor marker test revealed 36.4   09/09/2020 Imaging   1. Mild improvement in peritoneal and serosal metastasis along the splenic flexure and descending colon. 2. Inguinal lymph nodes are slightly increased in size. Lymph nodes increased in size from 06/10/2020 and similar to CT of 03/25/2020. 3. No new peritoneal disease.  No free fluid   09/21/2020 - 10/21/2020 Chemotherapy   She started taking Niraparib, discontinued due to intolerable side effects from headache and hypertension       10/01/2020 Tumor Marker   Patient's tumor was tested for the following markers: CA-125 Results of the tumor  marker test revealed 83.4.   10/16/2020 Imaging   No acute findings in the abdomen or pelvis.   Hepatic steatosis.   Large  stool burden throughout the colon.       12/03/2020 Tumor Marker   Patient's tumor was tested for the following markers: CA-125 Results of the tumor marker test revealed 146   12/20/2020 Imaging   1. Signs of peritoneal carcinomatosis. No significant interval change from previous exam. 2. No findings of solid organ metastasis or nodal metastasis within the abdomen or pelvis. Enlarged right inguinal node is mildly increased in size. Stable enlarged left inguinal node. 3. No evidence for metastatic disease to the chest. 4. Aortic atherosclerosis. Coronary artery calcifications.   12/27/2020 -  Chemotherapy    Patient is on Treatment Plan: OVARIAN PACLITAXEL D1,8,15, Q21 D         REVIEW OF SYSTEMS:   Constitutional: Denies fevers, chills or abnormal weight loss Eyes: Denies blurriness of vision Ears, nose, mouth, throat, and face: Denies mucositis or sore throat Respiratory: Denies cough, dyspnea or wheezes Cardiovascular: Denies palpitation, chest discomfort or lower extremity swelling Gastrointestinal:  Denies nausea, heartburn or change in bowel habits Skin: Denies abnormal skin rashes Lymphatics: Denies new lymphadenopathy or easy bruising Neurological:Denies numbness, tingling or new weaknesses Behavioral/Psych: Mood is stable, no new changes  All other systems were reviewed with the patient and are negative.  I have reviewed the past medical history, past surgical history, social history and family history with the patient and they are unchanged from previous note.  ALLERGIES:  has No Known Allergies.  MEDICATIONS:  Current Outpatient Medications  Medication Sig Dispense Refill   acetaminophen (TYLENOL) 325 MG tablet Take 650 mg by mouth every 6 (six) hours as needed for moderate pain or headache.      amoxicillin (AMOXIL) 500 MG capsule Take 500 mg by mouth in the morning and at bedtime.     amoxicillin (AMOXIL) 500 MG tablet Take 1 tablet (500 mg total) by mouth 2 (two)  times daily. 14 tablet 0   apixaban (ELIQUIS) 5 MG TABS tablet Take 1 tablet (5 mg total) by mouth 2 (two) times daily. --Start around 08/14/20 after you complete your initial starter pack 60 tablet 5   atenolol (TENORMIN) 25 MG tablet TAKE ONE (1) TABLET BY MOUTH EVERY DAY 30 tablet 1   cholecalciferol (VITAMIN D3) 25 MCG (1000 UNIT) tablet Take 1,000 Units by mouth daily.     levothyroxine (SYNTHROID) 75 MCG tablet Take 1 tablet (75 mcg total) by mouth daily before breakfast. 30 tablet 3   lidocaine-prilocaine (EMLA) cream Apply to affected area once (Patient taking differently: Apply 1 application topically daily as needed (port access).) 30 g 3   loratadine (CLARITIN) 10 MG tablet Take 10 mg by mouth daily.     Melatonin 3 MG TABS Take by mouth.     ondansetron (ZOFRAN) 8 MG tablet Take 8 mg by mouth every 8 (eight) hours as needed.     ondansetron (ZOFRAN) 8 MG tablet Take 1 tablet (8 mg total) by mouth 2 (two) times daily as needed (Nausea or vomiting). 30 tablet 1   pantoprazole (PROTONIX) 40 MG tablet Take 1 tablet (40 mg total) by mouth daily. 30 tablet 4   prochlorperazine (COMPAZINE) 10 MG tablet Take 10 mg by mouth every 6 (six) hours as needed.     prochlorperazine (COMPAZINE) 10 MG tablet Take 1 tablet (10 mg total) by mouth every 6 (six) hours as needed (  Nausea or vomiting). 30 tablet 1   senna-docusate (SENOKOT-S) 8.6-50 MG tablet Take 2 tablets by mouth at bedtime. 60 tablet 3   vitamin B-12 1000 MCG tablet Take 1 tablet (1,000 mcg total) by mouth daily. 30 tablet 3   No current facility-administered medications for this visit.    PHYSICAL EXAMINATION: ECOG PERFORMANCE STATUS: 1 - Symptomatic but completely ambulatory  Vitals:   01/24/21 1033  BP: (!) 138/52  Pulse: 66  Resp: 18  Temp: 97.6 F (36.4 C)  SpO2: 100%   Filed Weights   01/24/21 1033  Weight: 216 lb (98 kg)    GENERAL:alert, no distress and comfortable SKIN: skin color, texture, turgor are normal, no  rashes or significant lesions EYES: normal, Conjunctiva are pink and non-injected, sclera clear OROPHARYNX:no exudate, no erythema and lips, buccal mucosa, and tongue normal  NECK: supple, thyroid normal size, non-tender, without nodularity LYMPH:  no palpable lymphadenopathy in the cervical, axillary or inguinal LUNGS: clear to auscultation and percussion with normal breathing effort HEART: regular rate & rhythm and no murmurs and no lower extremity edema ABDOMEN:abdomen soft, non-tender and normal bowel sounds Musculoskeletal:no cyanosis of digits and no clubbing  NEURO: alert & oriented x 3 with fluent speech, no focal motor/sensory deficits  LABORATORY DATA:  I have reviewed the data as listed    Component Value Date/Time   NA 138 01/24/2021 0959   K 4.1 01/24/2021 0959   CL 104 01/24/2021 0959   CO2 25 01/24/2021 0959   GLUCOSE 115 (H) 01/24/2021 0959   BUN 17 01/24/2021 0959   CREATININE 0.96 01/24/2021 0959   CALCIUM 9.6 01/24/2021 0959   PROT 7.0 01/24/2021 0959   ALBUMIN 3.5 01/24/2021 0959   AST 20 01/24/2021 0959   ALT 22 01/24/2021 0959   ALKPHOS 102 01/24/2021 0959   BILITOT 0.3 01/24/2021 0959   GFRNONAA >60 01/24/2021 0959   GFRAA >60 03/25/2020 1144    No results found for: SPEP, UPEP  Lab Results  Component Value Date   WBC 3.4 (L) 01/24/2021   NEUTROABS 1.4 (L) 01/24/2021   HGB 11.4 (L) 01/24/2021   HCT 33.0 (L) 01/24/2021   MCV 100.0 01/24/2021   PLT 237 01/24/2021      Chemistry      Component Value Date/Time   NA 138 01/24/2021 0959   K 4.1 01/24/2021 0959   CL 104 01/24/2021 0959   CO2 25 01/24/2021 0959   BUN 17 01/24/2021 0959   CREATININE 0.96 01/24/2021 0959      Component Value Date/Time   CALCIUM 9.6 01/24/2021 0959   ALKPHOS 102 01/24/2021 0959   AST 20 01/24/2021 0959   ALT 22 01/24/2021 0959   BILITOT 0.3 01/24/2021 0959

## 2021-01-24 NOTE — Patient Instructions (Signed)
[facility]  Discharge Instructions: Thank you for choosing [facility] to provide your oncology and hematology care.   If you have a lab appointment with the [facility], please go directly to the [facility] and check in at the registration area.   Wear comfortable clothing and clothing appropriate for easy access to any Portacath or PICC line.   We strive to give you quality time with your provider. You may need to reschedule your appointment if you arrive late (15 or more minutes).  Arriving late affects you and other patients whose appointments are after yours.  Also, if you miss three or more appointments without notifying the office, you may be dismissed from the clinic at the provider's discretion.      For prescription refill requests, have your pharmacy contact our office and allow 72 hours for refills to be completed.    Today you received the following chemotherapy and/or immunotherapy agents : Taxol   To help prevent nausea and vomiting after your treatment, we encourage you to take your nausea medication as directed.  BELOW ARE SYMPTOMS THAT SHOULD BE REPORTED IMMEDIATELY: *FEVER GREATER THAN 100.4 F (38 C) OR HIGHER *CHILLS OR SWEATING *NAUSEA AND VOMITING THAT IS NOT CONTROLLED WITH YOUR NAUSEA MEDICATION *UNUSUAL SHORTNESS OF BREATH *UNUSUAL BRUISING OR BLEEDING *URINARY PROBLEMS (pain or burning when urinating, or frequent urination) *BOWEL PROBLEMS (unusual diarrhea, constipation, pain near the anus) TENDERNESS IN MOUTH AND THROAT WITH OR WITHOUT PRESENCE OF ULCERS (sore throat, sores in mouth, or a toothache) UNUSUAL RASH, SWELLING OR PAIN  UNUSUAL VAGINAL DISCHARGE OR ITCHING   Items with * indicate a potential emergency and should be followed up as soon as possible or go to the Emergency Department if any problems should occur.  Please show the CHEMOTHERAPY ALERT CARD or IMMUNOTHERAPY ALERT CARD at check-in to the  Emergency Department and triage nurse.  Should you have questions after your visit or need to cancel or reschedule your appointment, please contact [facility]  Dept: [phone]  and follow the prompts.  Office hours are 8:00 a.m. to 4:30 p.m. Monday - Friday. Please note that voicemails left after 4:00 p.m. may not be returned until the following business day.  We are closed weekends and major holidays. You have access to a nurse at all times for urgent questions. Please call the main number to the clinic Dept: [phone] and follow the prompts.   For any non-urgent questions, you may also contact your provider using MyChart. We now offer e-Visits for anyone 78 and older to request care online for non-urgent symptoms. For details visit [email or web address].   Also download the MyChart app! Go to the app store, search "MyChart", open the app, select [facility], and log in with your MyChart username and password.  Due to Covid, a mask is required upon entering the hospital/clinic. If you do not have a mask, one will be given to you upon arrival. For doctor visits, patients may have 1 support person aged 78 or older with them. For treatment visits, patients cannot have anyone with them due to current Covid guidelines and our immunocompromised population.

## 2021-01-27 ENCOUNTER — Other Ambulatory Visit: Payer: Medicare Other

## 2021-01-27 ENCOUNTER — Ambulatory Visit: Payer: Medicare Other | Admitting: Hematology and Oncology

## 2021-02-05 ENCOUNTER — Other Ambulatory Visit: Payer: Self-pay | Admitting: Hematology and Oncology

## 2021-02-06 ENCOUNTER — Other Ambulatory Visit: Payer: Self-pay | Admitting: Hematology and Oncology

## 2021-02-07 ENCOUNTER — Other Ambulatory Visit: Payer: Self-pay | Admitting: Hematology and Oncology

## 2021-02-07 ENCOUNTER — Other Ambulatory Visit: Payer: Self-pay

## 2021-02-07 ENCOUNTER — Telehealth: Payer: Self-pay

## 2021-02-07 ENCOUNTER — Inpatient Hospital Stay: Payer: Medicare Other | Attending: Gynecologic Oncology

## 2021-02-07 ENCOUNTER — Inpatient Hospital Stay: Payer: Medicare Other

## 2021-02-07 VITALS — BP 121/65 | HR 64 | Temp 98.0°F | Resp 16 | Ht 67.0 in | Wt 212.1 lb

## 2021-02-07 DIAGNOSIS — Z7901 Long term (current) use of anticoagulants: Secondary | ICD-10-CM | POA: Diagnosis not present

## 2021-02-07 DIAGNOSIS — N183 Chronic kidney disease, stage 3 unspecified: Secondary | ICD-10-CM | POA: Insufficient documentation

## 2021-02-07 DIAGNOSIS — G620 Drug-induced polyneuropathy: Secondary | ICD-10-CM | POA: Insufficient documentation

## 2021-02-07 DIAGNOSIS — H9209 Otalgia, unspecified ear: Secondary | ICD-10-CM | POA: Insufficient documentation

## 2021-02-07 DIAGNOSIS — Z79899 Other long term (current) drug therapy: Secondary | ICD-10-CM | POA: Insufficient documentation

## 2021-02-07 DIAGNOSIS — Z9221 Personal history of antineoplastic chemotherapy: Secondary | ICD-10-CM | POA: Insufficient documentation

## 2021-02-07 DIAGNOSIS — Z5111 Encounter for antineoplastic chemotherapy: Secondary | ICD-10-CM | POA: Diagnosis not present

## 2021-02-07 DIAGNOSIS — C786 Secondary malignant neoplasm of retroperitoneum and peritoneum: Secondary | ICD-10-CM | POA: Diagnosis not present

## 2021-02-07 DIAGNOSIS — I2699 Other pulmonary embolism without acute cor pulmonale: Secondary | ICD-10-CM | POA: Diagnosis not present

## 2021-02-07 DIAGNOSIS — L089 Local infection of the skin and subcutaneous tissue, unspecified: Secondary | ICD-10-CM | POA: Insufficient documentation

## 2021-02-07 DIAGNOSIS — Z7189 Other specified counseling: Secondary | ICD-10-CM

## 2021-02-07 DIAGNOSIS — D61810 Antineoplastic chemotherapy induced pancytopenia: Secondary | ICD-10-CM | POA: Insufficient documentation

## 2021-02-07 DIAGNOSIS — C561 Malignant neoplasm of right ovary: Secondary | ICD-10-CM | POA: Insufficient documentation

## 2021-02-07 DIAGNOSIS — Z90721 Acquired absence of ovaries, unilateral: Secondary | ICD-10-CM | POA: Insufficient documentation

## 2021-02-07 LAB — CMP (CANCER CENTER ONLY)
ALT: 24 U/L (ref 0–44)
AST: 22 U/L (ref 15–41)
Albumin: 3.7 g/dL (ref 3.5–5.0)
Alkaline Phosphatase: 102 U/L (ref 38–126)
Anion gap: 10 (ref 5–15)
BUN: 17 mg/dL (ref 8–23)
CO2: 25 mmol/L (ref 22–32)
Calcium: 10.1 mg/dL (ref 8.9–10.3)
Chloride: 102 mmol/L (ref 98–111)
Creatinine: 1.01 mg/dL — ABNORMAL HIGH (ref 0.44–1.00)
GFR, Estimated: 57 mL/min — ABNORMAL LOW (ref >60)
Glucose, Bld: 136 mg/dL — ABNORMAL HIGH (ref 70–99)
Potassium: 4.4 mmol/L (ref 3.5–5.1)
Sodium: 137 mmol/L (ref 135–145)
Total Bilirubin: 0.4 mg/dL (ref 0.3–1.2)
Total Protein: 7 g/dL (ref 6.5–8.1)

## 2021-02-07 LAB — CBC WITH DIFFERENTIAL (CANCER CENTER ONLY)
Abs Immature Granulocytes: 0.01 10*3/uL (ref 0.00–0.07)
Basophils Absolute: 0 10*3/uL (ref 0.0–0.1)
Basophils Relative: 1 %
Eosinophils Absolute: 0 10*3/uL (ref 0.0–0.5)
Eosinophils Relative: 1 %
HCT: 34.4 % — ABNORMAL LOW (ref 36.0–46.0)
Hemoglobin: 11.5 g/dL — ABNORMAL LOW (ref 12.0–15.0)
Immature Granulocytes: 0 %
Lymphocytes Relative: 45 %
Lymphs Abs: 1.6 10*3/uL (ref 0.7–4.0)
MCH: 33.4 pg (ref 26.0–34.0)
MCHC: 33.4 g/dL (ref 30.0–36.0)
MCV: 100 fL (ref 80.0–100.0)
Monocytes Absolute: 0.5 10*3/uL (ref 0.1–1.0)
Monocytes Relative: 13 %
Neutro Abs: 1.4 10*3/uL — ABNORMAL LOW (ref 1.7–7.7)
Neutrophils Relative %: 40 %
Platelet Count: 268 10*3/uL (ref 150–400)
RBC: 3.44 MIL/uL — ABNORMAL LOW (ref 3.87–5.11)
RDW: 14.9 % (ref 11.5–15.5)
WBC Count: 3.6 10*3/uL — ABNORMAL LOW (ref 4.0–10.5)
nRBC: 0 % (ref 0.0–0.2)

## 2021-02-07 MED ORDER — DIPHENHYDRAMINE HCL 50 MG/ML IJ SOLN
INTRAMUSCULAR | Status: AC
  Filled 2021-02-07: qty 1

## 2021-02-07 MED ORDER — FAMOTIDINE 20 MG IN NS 100 ML IVPB
20.0000 mg | Freq: Once | INTRAVENOUS | Status: AC
  Administered 2021-02-07: 20 mg via INTRAVENOUS
  Filled 2021-02-07: qty 100

## 2021-02-07 MED ORDER — SODIUM CHLORIDE 0.9% FLUSH
10.0000 mL | INTRAVENOUS | Status: DC | PRN
  Administered 2021-02-07: 10 mL
  Filled 2021-02-07: qty 10

## 2021-02-07 MED ORDER — SODIUM CHLORIDE 0.9 % IV SOLN
64.0000 mg/m2 | Freq: Once | INTRAVENOUS | Status: AC
  Administered 2021-02-07: 138 mg via INTRAVENOUS
  Filled 2021-02-07: qty 23

## 2021-02-07 MED ORDER — SODIUM CHLORIDE 0.9% FLUSH
10.0000 mL | Freq: Once | INTRAVENOUS | Status: AC
  Administered 2021-02-07: 10 mL
  Filled 2021-02-07: qty 10

## 2021-02-07 MED ORDER — HEPARIN SOD (PORK) LOCK FLUSH 100 UNIT/ML IV SOLN
500.0000 [IU] | Freq: Once | INTRAVENOUS | Status: AC | PRN
  Administered 2021-02-07: 500 [IU]
  Filled 2021-02-07: qty 5

## 2021-02-07 MED ORDER — SODIUM CHLORIDE 0.9 % IV SOLN
10.0000 mg | Freq: Once | INTRAVENOUS | Status: AC
  Administered 2021-02-07: 10 mg via INTRAVENOUS
  Filled 2021-02-07: qty 10

## 2021-02-07 MED ORDER — SODIUM CHLORIDE 0.9 % IV SOLN
Freq: Once | INTRAVENOUS | Status: AC
  Filled 2021-02-07: qty 250

## 2021-02-07 MED ORDER — DIPHENHYDRAMINE HCL 50 MG/ML IJ SOLN
12.5000 mg | Freq: Once | INTRAMUSCULAR | Status: AC
Start: 2021-02-07 — End: 2021-02-07
  Administered 2021-02-07: 12.5 mg via INTRAVENOUS

## 2021-02-07 NOTE — Telephone Encounter (Signed)
Faxed referral to Dr. Velvet Bathe office for left ear pain [phone]. Received confirmation.

## 2021-02-07 NOTE — Patient Instructions (Addendum)
[facility]   Discharge Instructions: ** You have been referred to Dr. Deeann Saint office in [place] located at [address], [place], [place] [address], [place] for ear discomfort. Someone from their office will be in touch to get an appointment scheduled ** Thank you for choosing [facility] to provide your oncology and hematology care.   If you have a lab appointment with the [facility], please go directly to the [facility] and check in at the registration area.   Wear comfortable clothing and clothing appropriate for easy access to any Portacath or PICC line.   We strive to give you quality time with your provider. You may need to reschedule your appointment if you arrive late (15 or more minutes).  Arriving late affects you and other patients whose appointments are after yours.  Also, if you miss three or more appointments without notifying the office, you may be dismissed from the clinic at the provider's discretion.      For prescription refill requests, have your pharmacy contact our office and allow 72 hours for refills to be completed.    Today you received the following chemotherapy and/or immunotherapy agents: Paclitaxel (Taxol)      To help prevent nausea and vomiting after your treatment, we encourage you to take your nausea medication as directed.  BELOW ARE SYMPTOMS THAT SHOULD BE REPORTED IMMEDIATELY: *FEVER GREATER THAN 100.4 F (38 C) OR HIGHER *CHILLS OR SWEATING *NAUSEA AND VOMITING THAT IS NOT CONTROLLED WITH YOUR NAUSEA MEDICATION *UNUSUAL SHORTNESS OF BREATH *UNUSUAL BRUISING OR BLEEDING *URINARY PROBLEMS (pain or burning when urinating, or frequent urination) *BOWEL PROBLEMS (unusual diarrhea, constipation, pain near the anus) TENDERNESS IN MOUTH AND THROAT WITH OR WITHOUT PRESENCE OF ULCERS (sore throat, sores in mouth, or a toothache) UNUSUAL RASH, SWELLING OR PAIN  UNUSUAL VAGINAL DISCHARGE OR ITCHING    Items with * indicate a potential emergency and should be followed up as soon as possible or go to the Emergency Department if any problems should occur.  Please show the CHEMOTHERAPY ALERT CARD or IMMUNOTHERAPY ALERT CARD at check-in to the Emergency Department and triage nurse.  Should you have questions after your visit or need to cancel or reschedule your appointment, please contact [facility]  Dept: [phone]  and follow the prompts.  Office hours are 8:00 a.m. to 4:30 p.m. Monday - Friday. Please note that voicemails left after 4:00 p.m. may not be returned until the following business day.  We are closed weekends and major holidays. You have access to a nurse at all times for urgent questions. Please call the main number to the clinic Dept: [phone] and follow the prompts.   For any non-urgent questions, you may also contact your provider using MyChart. We now offer e-Visits for anyone 63 and older to request care online for non-urgent symptoms. For details visit [email or web address].   Also download the MyChart app! Go to the app store, search "MyChart", open the app, select [facility], and log in with your MyChart username and password.  Due to Covid, a mask is required upon entering the hospital/clinic. If you do not have a mask, one will be given to you upon arrival. For doctor visits, patients may have 1 support person aged 20 or older with them. For treatment visits, patients cannot have anyone with them due to current Covid guidelines and our immunocompromised population.

## 2021-02-08 LAB — CA 125: Cancer Antigen (CA) 125: 395 U/mL — ABNORMAL HIGH (ref 0.0–38.1)

## 2021-02-14 ENCOUNTER — Inpatient Hospital Stay: Payer: Medicare Other

## 2021-02-14 ENCOUNTER — Encounter: Payer: Self-pay | Admitting: Hematology and Oncology

## 2021-02-14 ENCOUNTER — Other Ambulatory Visit: Payer: Self-pay

## 2021-02-14 ENCOUNTER — Inpatient Hospital Stay: Payer: Medicare Other | Admitting: Hematology and Oncology

## 2021-02-14 DIAGNOSIS — D61810 Antineoplastic chemotherapy induced pancytopenia: Secondary | ICD-10-CM | POA: Diagnosis not present

## 2021-02-14 DIAGNOSIS — Z79899 Other long term (current) drug therapy: Secondary | ICD-10-CM | POA: Diagnosis not present

## 2021-02-14 DIAGNOSIS — T451X5D Adverse effect of antineoplastic and immunosuppressive drugs, subsequent encounter: Secondary | ICD-10-CM

## 2021-02-14 DIAGNOSIS — C561 Malignant neoplasm of right ovary: Secondary | ICD-10-CM

## 2021-02-14 DIAGNOSIS — C786 Secondary malignant neoplasm of retroperitoneum and peritoneum: Secondary | ICD-10-CM | POA: Diagnosis not present

## 2021-02-14 DIAGNOSIS — D61818 Other pancytopenia: Secondary | ICD-10-CM

## 2021-02-14 DIAGNOSIS — Z7189 Other specified counseling: Secondary | ICD-10-CM

## 2021-02-14 DIAGNOSIS — N183 Chronic kidney disease, stage 3 unspecified: Secondary | ICD-10-CM | POA: Diagnosis not present

## 2021-02-14 DIAGNOSIS — I2699 Other pulmonary embolism without acute cor pulmonale: Secondary | ICD-10-CM | POA: Diagnosis not present

## 2021-02-14 DIAGNOSIS — Z7901 Long term (current) use of anticoagulants: Secondary | ICD-10-CM | POA: Diagnosis not present

## 2021-02-14 DIAGNOSIS — Z5111 Encounter for antineoplastic chemotherapy: Secondary | ICD-10-CM | POA: Diagnosis not present

## 2021-02-14 DIAGNOSIS — G620 Drug-induced polyneuropathy: Secondary | ICD-10-CM

## 2021-02-14 DIAGNOSIS — L089 Local infection of the skin and subcutaneous tissue, unspecified: Secondary | ICD-10-CM | POA: Diagnosis not present

## 2021-02-14 DIAGNOSIS — Z9221 Personal history of antineoplastic chemotherapy: Secondary | ICD-10-CM | POA: Diagnosis not present

## 2021-02-14 LAB — CBC WITH DIFFERENTIAL (CANCER CENTER ONLY)
Abs Immature Granulocytes: 0.03 10*3/uL (ref 0.00–0.07)
Basophils Absolute: 0 10*3/uL (ref 0.0–0.1)
Basophils Relative: 1 %
Eosinophils Absolute: 0.1 10*3/uL (ref 0.0–0.5)
Eosinophils Relative: 2 %
HCT: 31.8 % — ABNORMAL LOW (ref 36.0–46.0)
Hemoglobin: 11 g/dL — ABNORMAL LOW (ref 12.0–15.0)
Immature Granulocytes: 1 %
Lymphocytes Relative: 43 %
Lymphs Abs: 2.1 10*3/uL (ref 0.7–4.0)
MCH: 34.1 pg — ABNORMAL HIGH (ref 26.0–34.0)
MCHC: 34.6 g/dL (ref 30.0–36.0)
MCV: 98.5 fL (ref 80.0–100.0)
Monocytes Absolute: 0.4 10*3/uL (ref 0.1–1.0)
Monocytes Relative: 9 %
Neutro Abs: 2 10*3/uL (ref 1.7–7.7)
Neutrophils Relative %: 44 %
Platelet Count: 261 10*3/uL (ref 150–400)
RBC: 3.23 MIL/uL — ABNORMAL LOW (ref 3.87–5.11)
RDW: 14.8 % (ref 11.5–15.5)
WBC Count: 4.6 10*3/uL (ref 4.0–10.5)
nRBC: 0.7 % — ABNORMAL HIGH (ref 0.0–0.2)

## 2021-02-14 LAB — CMP (CANCER CENTER ONLY)
ALT: 20 U/L (ref 0–44)
AST: 19 U/L (ref 15–41)
Albumin: 3.8 g/dL (ref 3.5–5.0)
Alkaline Phosphatase: 96 U/L (ref 38–126)
Anion gap: 13 (ref 5–15)
BUN: 16 mg/dL (ref 8–23)
CO2: 22 mmol/L (ref 22–32)
Calcium: 9.7 mg/dL (ref 8.9–10.3)
Chloride: 102 mmol/L (ref 98–111)
Creatinine: 1 mg/dL (ref 0.44–1.00)
GFR, Estimated: 58 mL/min — ABNORMAL LOW (ref >60)
Glucose, Bld: 130 mg/dL — ABNORMAL HIGH (ref 70–99)
Potassium: 4 mmol/L (ref 3.5–5.1)
Sodium: 137 mmol/L (ref 135–145)
Total Bilirubin: 0.3 mg/dL (ref 0.3–1.2)
Total Protein: 7.1 g/dL (ref 6.5–8.1)

## 2021-02-14 MED ORDER — SODIUM CHLORIDE 0.9 % IV SOLN
64.0000 mg/m2 | Freq: Once | INTRAVENOUS | Status: AC
  Administered 2021-02-14: 138 mg via INTRAVENOUS
  Filled 2021-02-14: qty 23

## 2021-02-14 MED ORDER — SODIUM CHLORIDE 0.9% FLUSH
10.0000 mL | Freq: Once | INTRAVENOUS | Status: AC
  Administered 2021-02-14: 10 mL

## 2021-02-14 MED ORDER — SODIUM CHLORIDE 0.9 % IV SOLN: Freq: Once | INTRAVENOUS | Status: AC

## 2021-02-14 MED ORDER — CEPHALEXIN 500 MG PO CAPS: 500.0000 mg | ORAL_CAPSULE | Freq: Three times a day (TID) | ORAL | 0 refills | Status: DC

## 2021-02-14 MED ORDER — SODIUM CHLORIDE 0.9% FLUSH
10.0000 mL | INTRAVENOUS | Status: DC | PRN
  Administered 2021-02-14: 10 mL

## 2021-02-14 MED ORDER — HEPARIN SOD (PORK) LOCK FLUSH 100 UNIT/ML IV SOLN
500.0000 [IU] | Freq: Once | INTRAVENOUS | Status: AC | PRN
  Administered 2021-02-14: 500 [IU]

## 2021-02-14 MED ORDER — SODIUM CHLORIDE 0.9 % IV SOLN
10.0000 mg | Freq: Once | INTRAVENOUS | Status: AC
  Administered 2021-02-14: 10 mg via INTRAVENOUS
  Filled 2021-02-14: qty 10

## 2021-02-14 MED ORDER — FAMOTIDINE 20 MG IN NS 100 ML IVPB
20.0000 mg | Freq: Once | INTRAVENOUS | Status: AC
  Administered 2021-02-14: 20 mg via INTRAVENOUS
  Filled 2021-02-14: qty 100

## 2021-02-14 MED ORDER — DIPHENHYDRAMINE HCL 50 MG/ML IJ SOLN
12.5000 mg | Freq: Once | INTRAMUSCULAR | Status: AC
  Administered 2021-02-14: 12.5 mg via INTRAVENOUS
  Filled 2021-02-14: qty 1

## 2021-02-14 NOTE — Assessment & Plan Note (Signed)
She denies worsening peripheral neuropathy She will continue reduced dose Taxol for now

## 2021-02-14 NOTE — Patient Instructions (Signed)
[facility]   Discharge Instructions: Thank you for choosing [facility] to provide your oncology and hematology care.   If you have a lab appointment with the Cancer Center, please go directly to the Cancer Center and check in at the registration area.   Wear comfortable clothing and clothing appropriate for easy access to any Portacath or PICC line.   We strive to give you quality time with your provider. You may need to reschedule your appointment if you arrive late (15 or more minutes).  Arriving late affects you and other patients whose appointments are after yours.  Also, if you miss three or more appointments without notifying the office, you may be dismissed from the clinic at the provider's discretion.      For prescription refill requests, have your pharmacy contact our office and allow 72 hours for refills to be completed.    Today you received the following chemotherapy and/or immunotherapy agents: paclitaxel.      To help prevent nausea and vomiting after your treatment, we encourage you to take your nausea medication as directed.  BELOW ARE SYMPTOMS THAT SHOULD BE REPORTED IMMEDIATELY: *FEVER GREATER THAN 100.4 F (38 C) OR HIGHER *CHILLS OR SWEATING *NAUSEA AND VOMITING THAT IS NOT CONTROLLED WITH YOUR NAUSEA MEDICATION *UNUSUAL SHORTNESS OF BREATH *UNUSUAL BRUISING OR BLEEDING *URINARY PROBLEMS (pain or burning when urinating, or frequent urination) *BOWEL PROBLEMS (unusual diarrhea, constipation, pain near the anus) TENDERNESS IN MOUTH AND THROAT WITH OR WITHOUT PRESENCE OF ULCERS (sore throat, sores in mouth, or a toothache) UNUSUAL RASH, SWELLING OR PAIN  UNUSUAL VAGINAL DISCHARGE OR ITCHING   Items with * indicate a potential emergency and should be followed up as soon as possible or go to the Emergency Department if any problems should occur.  Please show the CHEMOTHERAPY ALERT CARD or IMMUNOTHERAPY ALERT CARD at check-in  to the Emergency Department and triage nurse.  Should you have questions after your visit or need to cancel or reschedule your appointment, please contact [facility]  Dept: [phone]  and follow the prompts.  Office hours are 8:00 a.m. to 4:30 p.m. Monday - Friday. Please note that voicemails left after 4:00 p.m. may not be returned until the following business day.  We are closed weekends and major holidays. You have access to a nurse at all times for urgent questions. Please call the main number to the clinic Dept: [phone] and follow the prompts.   For any non-urgent questions, you may also contact your provider using MyChart. We now offer e-Visits for anyone 18 and older to request care online for non-urgent symptoms. For details visit [email or web address].   Also download the MyChart app! Go to the app store, search "MyChart", open the app, select [facility], and log in with your MyChart username and password.  Due to Covid, a mask is required upon entering the hospital/clinic. If you do not have a mask, one will be given to you upon arrival. For doctor visits, patients may have 1 support person aged 18 or older with them. For treatment visits, patients cannot have anyone with them due to current Covid guidelines and our immunocompromised population.   

## 2021-02-14 NOTE — Progress Notes (Signed)
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[facility]) She tolerated single agent Taxol better She has mild recurrent pancytopenia but otherwise stable We will proceed with treatment as schedule I plan to repeat imaging study in September  Pancytopenia, acquired [facility]) She had mild intermittent pancytopenia due to treatment We will observe and proceed with treatment without delay  Peripheral neuropathy due to chemotherapy [facility]) She denies worsening peripheral neuropathy She will continue reduced dose Taxol for now  Skin infection I recommend short course of antibiotics  No orders of the defined types were placed in this encounter.   All questions were answered. The patient knows to call the clinic with any problems, questions or concerns. The total time spent in the appointment was 20 minutes encounter with patients including review of chart and various tests results, discussions about plan of care and coordination of care plan   [name], MD 02/14/2021 4:29 PM  INTERVAL HISTORY: Please see below for problem oriented charting. 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  She has bloating and early satiety. Shortness of breath with walking. She denied bleeding. She reported constipation with pain with defecation and narrowed stools   11/29/2018 Imaging   1. 13 cm complex cystic lesion in the central  pelvis, highly suspicious for ovarian cystadenocarcinoma. 2. Diffuse peritoneal carcinomatosis with mild ascites. 3. Mild lymphadenopathy in porta hepatis and right cardiophrenic angle, suspicious for metastatic disease. 4. Moderate right and tiny left pleural effusions   12/05/2018 Tumor Marker   Patient's tumor was tested for the following markers: CA-125 Results of the tumor marker test revealed 1015.   12/06/2018 Cancer Staging   Staging form: Ovary, Fallopian Tube, and Primary Peritoneal Carcinoma, AJCC 8th Edition - Clinical: FIGO Stage IVA, calculated as Stage IV (cT3c, cN1, cM1) - Signed by [name], MD on 12/06/2018   12/09/2018 Pathology Results   PLEURAL FLUID, RIGHT (SPECIMEN 1 OF 1 COLLECTED 12/09/18): - MALIGNANT CELLS CONSISTENT WITH METASTATIC ADENOCARCINOMA - SEE COMMENT Comment The neoplastic cells are positive for cytokeratin 7 and Pax-8 but negative for cytokeratin 20, TTF-1, CDX-2 and Gata-3. Overall, the phenotype is consistent with the clinical impression of gynecologic primary.    12/15/2018 Procedure   Placement of single lumen port a cath via right internal jugular vein. The catheter tip lies at the cavo-atrial junction. A power injectable port a cath was placed and is ready for immediate use   12/16/2018 - 08/24/2019 Chemotherapy   The patient had carboplatin and taxol for chemotherapy treatment.  She had 7 cycles given neoadjuvant prior to surgery and 3 more cycles after surgery, for a total of 10 cycles of treatment    01/06/2019 Tumor Marker   Patient's tumor was tested for the following markers: CA-125 Results of the tumor marker test revealed 948   02/02/2019 Imaging   1. Massive pulmonary embolism, as discussed above. Given the mildly elevated RV to LV ratio of 0.95, this is associated  with increased risk of morbidity and mortality. 2. Today's study demonstrates a mixed response to therapy. Specifically, while there has been regression of the bulky  intraperitoneal metastatic disease and regression of previously noted pleural effusions, the large cystic mass in the central pelvis has increased in size compared to the prior study. 3. New right mild hydroureteronephrosis related to extrinsic compression on the distal third of the right ureter by the patient's large pelvic mass. 4. Scattered small pulmonary nodules (predominantly pleural based) appear stable compared to prior examinations. These are nonspecific but warrant continued attention on follow-up studies. 5. Aortic atherosclerosis, in addition to three-vessel coronary artery disease. Assessment for potential risk factor modification, dietary therapy or pharmacologic therapy may be warranted, if clinically indicated.   02/02/2019 - 02/04/2019 Hospital Admission   She was admitted the hospital due to DVT and PE   02/03/2019 Imaging   Bilateral venous Doppler US Right: Findings consistent with acute deep vein thrombosis involving the right femoral vein, right popliteal vein, right peroneal veins, right soleal veins, and right gastrocnemius veins. No cystic structure found in the popliteal fossa. Left: There is no evidence of deep vein thrombosis in the lower extremity. No cystic structure found in the popliteal fossa   02/17/2019 Tumor Marker   Patient's tumor was tested for the following markers: CA-125 Results of the tumor marker test revealed 127   03/10/2019 Tumor Marker   Patient's tumor was tested for the following markers: CA-125. Results of the tumor marker test revealed 87.4   03/14/2019 - 03/16/2019 Hospital Admission   She was admitted to the hospital recently for weakness   03/31/2019 Tumor Marker   Patient's tumor was tested for the following markers: CA-125 Results of the tumor marker test revealed 59.2.   04/28/2019 Tumor Marker   Patient's tumor was tested for the following markers: CA-125 Results of the tumor marker test revealed 56.8   05/05/2019 Imaging   1. Interval  decrease in size of the large cystic mass in the central pelvis. Mesenteric and omental soft tissue disease shows no substantial interval change. 2. The mild right hydroureteronephrosis seen previously has resolved in the interval. 3. Small residual nonobstructive thrombus identified in the inter lobar pulmonary artery common here into the lateral wall compatible with chronicity. 4. 14 mm subtle enhancing lesion in the anterior right liver is stable. This may be vascular malformation. Attention on follow-up recommended. 5. Stable appearance of the multiple small bilateral pulmonary nodules. Continued attention on follow-up recommended. 6.  Aortic Atherosclerois (ICD10-170.0)     06/06/2019 Pathology Results   A. OMENTUM, RESECTION: - Metastatic carcinoma. B. RIGHT FALLOPIAN TUBE AND OVARY, SALPINGOOOPHORECTOMY: - High-grade serous carcinoma, spanning 9 cm. - No surface involvement identified. - Fallopian tube involved by carcinoma. - See oncology table. C. PERITONEAL NODULE, EXCISION: - Metastatic carcinoma. ONCOLOGY TABLE: OVARY or FALLOPIAN TUBE or PRIMARY PERITONEUM: Procedure: Right salpingo-oophorectomy, omental resection, and peritoneal biopsy. Specimen Integrity: Intact Tumor Site: Right ovary Ovarian Surface Involvement (required only if applicable): Not identified Tumor Size: 9 cm Histologic Type: High-grade serous carcinoma Histologic Grade: High-grade Implants (required for advanced stage serous/seromucinous borderline tumors only): Omentum, peritoneum. Other Tissue/ Organ Involvement: Right fallopian tube Largest Extrapelvic Peritoneal Focus (required only if applicable): 7.3 cm Peritoneal/Ascitic Fluid: Positive pleural fluid pre neoadjuvant therapy. Treatment Effect (required only for high-grade serous carcinomas): Probable treatment effect present. Regional Lymph Nodes: No lymph nodes submitted or found Pathologic Stage Classification (pTNM, AJCC 8th Edition): ypT3c,  ypNX Representative Tumor Block: B2  06/06/2019 Surgery   Preoperative Diagnosis: stage IV ovarian cancer, s/p neoadjuvant chemotherapy, history of recent PE.  Procedure(s) Performed: Exploratory laparotomy with right salpingo-oophorectomy, omentectomy radical tumor debulking for ovarian cancer .   Surgeon: Thereasa Solo, MD.    Operative Findings:  Omental cake adherent to anterior abdominal wall and hepatic flexure. 10cm right tube and ovary. Surgically absent uterus and left tube and ovary. Granular nodularity across right diaphragm.    This represented an optimal cytoreduction (R0) with no gross visible disease remaining.    07/06/2019 Tumor Marker   Patient's tumor was tested for the following markers:CA-125 Results of the tumor marker test revealed 16.4   08/10/2019 Genetic Testing   Negative genetic testing. No pathogenic variants identified. VUS in BRCA2 called [id number] identified on the [employer] CancerNext+RNAinsight panel. The report date is 08/10/2019.  The CancerNext+RNAinsight gene panel offered by [employer] [facility] includes sequencing and rearrangement analysis for the following 36 genes: APC*, ATM*, AXIN2, BARD1, BMPR1A, BRCA1*, BRCA2*, BRIP1*, CDH1*, CDK4, CDKN2A, CHEK2*, DICER1, MLH1*, MSH2*, MSH3, MSH6*, MUTYH*, NBN, NF1*, NTHL1, PALB2*, PMS2*, PTEN*, RAD51C*, RAD51D*, RECQL, SMAD4, SMARCA4, STK11 and TP53* (sequencing and deletion/duplication); HOXB13, POLD1 and POLE (sequencing only); EPCAM and GREM1 (deletion/duplication only). DNA and RNA analyses performed for * genes.   HRD (somatic) testing was initially ordered and was not completed due to insufficient amount of tumor sample.    08/24/2019 Tumor Marker   Patient's tumor was tested for the following markers: CA-125 Results of the tumor marker test revealed 12.2   09/20/2019 Imaging   1. Interval debulking, bilateral salpingo oophorectomy and omentectomy with resection of the dominant pelvic cystic lesion and the bandlike  soft tissue seen previously in the right mesentery/omentum. Today's study is status shows new postoperative baseline for follow-up. 2. 6 mm gastrohepatic ligament nodule seen on the previous study is stable. Continued attention on follow-up recommended. 3. No new or progressive findings in the chest, abdomen, or pelvis to suggest disease progression. 4. Stable 1.4 cm focus of homogeneous enhancement in the anterior right liver. Stable since at least 11/29/2018 and likely benign. Continued attention on follow-up recommended. 5. Aortic Atherosclerosis (ICD10-I70.0).   09/20/2019 Tumor Marker   Patient's tumor was tested for the following markers: CA-125 Results of the tumor marker test revealed 11.9   12/26/2019 Tumor Marker   Patient's tumor was tested for the following markers: CA-125 Results of the tumor marker test revealed 16.5   03/25/2020 Imaging   1. Interval progression of peritoneal disease which predominantly involves the serosal surface of the proximal and distal transverse colon, and descending colon. 2. New bilateral inguinal adenopathy. 3. No ascites. 4. Aortic atherosclerosis.   03/25/2020 Tumor Marker   Patient's tumor was tested for the following markers: CA-125 Results of the tumor marker test revealed 249   03/28/2020 Echocardiogram    1. Borderline LV Strain; A2C view represents the most accurate acquisition.. Left ventricular ejection fraction, by estimation, is 60 to 65%. Left ventricular ejection fraction by PLAX is 63 %. The left ventricle has normal function. The left ventricle demonstrates regional wall motion abnormalities (see scoring diagram/findings for description). Left ventricular diastolic parameters were normal.  2. Right ventricular systolic function is normal. The right ventricular size is normal.  3. Left atrial size was mildly dilated.  4. The mitral valve is normal in structure. No evidence of mitral valve regurgitation.  5. The aortic valve is grossly  normal. Aortic valve regurgitation is mild.  6. The inferior vena cava is normal  in size with greater than 50% respiratory variability, suggesting right atrial pressure of 3 mmHg.   04/02/2020 Tumor Marker   Patient's tumor was tested for the following markers: CA-125 Results of the tumor marker test revealed 304   04/02/2020 - 08/22/2020 Chemotherapy   The patient had carboplatin and Doxil for chemotherapy treatment.     05/27/2020 Tumor Marker   Patient's tumor was tested for the following markers: CA-125. Results of the tumor marker test revealed 42.7   06/10/2020 Imaging   Improving peritoneal disease and serosal implants along the left colon, as above.   Improving bilateral inguinal nodes.   06/18/2020 Echocardiogram    1. Left ventricular ejection fraction, by estimation, is 60 to 65%. The left ventricle has normal function. The left ventricle has no regional wall motion abnormalities. There is mild left ventricular hypertrophy of the basal-septal segment. Left ventricular diastolic parameters were normal. The average left ventricular global longitudinal strain is -25.0 %. The global longitudinal strain is normal.  2. Right ventricular systolic function is normal. The right ventricular size is normal.  3. The mitral valve is grossly normal. Mild mitral valve regurgitation.  4. Tricuspid valve regurgitation is moderate.  5. The aortic valve is tricuspid. There is mild calcification of the aortic valve. There is mild thickening of the aortic valve. Aortic valve regurgitation is mild to moderate.  6. The inferior vena cava is normal in size with <50% respiratory variability, suggesting right atrial pressure of 8 mmHg.   06/25/2020 Tumor Marker   Patient's tumor was tested for the following markers: CA-125 Results of the tumor marker test revealed 23.9   07/15/2020 Imaging   IMPRESSION: 1. Mild amount of acute pulmonary embolism within multiple middle lobe and lower lobe branches of  the right pulmonary artery. 2.   Small amount of chronic right pulmonary embolism. 3.   Mild posterior left basilar atelectasis and/or infiltrate. 4.   Small left pleural effusion. 5. Aortic atherosclerosis.   Aortic Atherosclerosis (ICD10-I70.0).     07/15/2020 - 07/17/2020 Hospital Admission   She was admitted to a local hospital after presentation with chest pain and shortness of breath and was found to have significant pulmonary embolism She was anticoagulated and discharged   07/16/2020 Echocardiogram   1. Left ventricular ejection fraction, by estimation, is 60 to 65%. The left ventricle has normal function. The left ventricle has no regional wall motion abnormalities. There is mild left ventricular hypertrophy.  Left ventricular diastolic parameters are indeterminate.   2. Right ventricular systolic function is normal. The right ventricular size is normal. There is normal pulmonary artery systolic pressure. The estimated right ventricular systolic pressure is 40.9 mmHg.   3. The mitral valve is grossly normal. Trivial mitral valve regurgitation.   4. The aortic valve is tricuspid. Aortic valve regurgitation is mild.   5. The inferior vena cava is normal in size with greater than 50% respiratory variability, suggesting right atrial pressure of 3 mmHg.    07/25/2020 Tumor Marker   Patient's tumor was tested for the following markers: CA-125 Results of the tumor marker test revealed 36.4   09/09/2020 Imaging   1. Mild improvement in peritoneal and serosal metastasis along the splenic flexure and descending colon. 2. Inguinal lymph nodes are slightly increased in size. Lymph nodes increased in size from 06/10/2020 and similar to CT of 03/25/2020. 3. No new peritoneal disease.  No free fluid   09/21/2020 - 10/21/2020 Chemotherapy   She started taking Niraparib, discontinued due to  intolerable side effects from headache and hypertension       10/01/2020 Tumor Marker   Patient's tumor was  tested for the following markers: CA-125 Results of the tumor marker test revealed 83.4.   10/16/2020 Imaging   No acute findings in the abdomen or pelvis.   Hepatic steatosis.   Large stool burden throughout the colon.       12/03/2020 Tumor Marker   Patient's tumor was tested for the following markers: CA-125 Results of the tumor marker test revealed 146   12/20/2020 Imaging   1. Signs of peritoneal carcinomatosis. No significant interval change from previous exam. 2. No findings of solid organ metastasis or nodal metastasis within the abdomen or pelvis. Enlarged right inguinal node is mildly increased in size. Stable enlarged left inguinal node. 3. No evidence for metastatic disease to the chest. 4. Aortic atherosclerosis. Coronary artery calcifications.   12/27/2020 -  Chemotherapy    Patient is on Treatment Plan: OVARIAN PACLITAXEL D1,8,15, Q21 D       02/07/2021 Tumor Marker   Patient's tumor was tested for the following markers: CA-125. Results of the tumor marker test revealed 395.     REVIEW OF SYSTEMS:   Constitutional: Denies fevers, chills or abnormal weight loss Eyes: Denies blurriness of vision Ears, nose, mouth, throat, and face: Denies mucositis or sore throat Respiratory: Denies cough, dyspnea or wheezes Cardiovascular: Denies palpitation, chest discomfort or lower extremity swelling Gastrointestinal:  Denies nausea, heartburn or change in bowel habits Lymphatics: Denies new lymphadenopathy or easy bruising Neurological:Denies numbness, tingling or new weaknesses Behavioral/Psych: Mood is stable, no new changes  All other systems were reviewed with the patient and are negative.  I have reviewed the past medical history, past surgical history, social history and family history with the patient and they are unchanged from previous note.  ALLERGIES:  has No Known Allergies.  MEDICATIONS:  Current Outpatient Medications  Medication Sig Dispense Refill    cephALEXin (KEFLEX) 500 MG capsule Take 1 capsule (500 mg total) by mouth 3 (three) times daily. 21 capsule 0   acetaminophen (TYLENOL) 325 MG tablet Take 650 mg by mouth every 6 (six) hours as needed for moderate pain or headache.      amoxicillin (AMOXIL) 500 MG capsule Take 500 mg by mouth in the morning and at bedtime.     apixaban (ELIQUIS) 5 MG TABS tablet Take 1 tablet (5 mg total) by mouth 2 (two) times daily. --Start around 08/14/20 after you complete your initial starter pack 60 tablet 5   atenolol (TENORMIN) 25 MG tablet TAKE ONE (1) TABLET BY MOUTH EVERY DAY 30 tablet 1   cholecalciferol (VITAMIN D3) 25 MCG (1000 UNIT) tablet Take 1,000 Units by mouth daily.     levothyroxine (SYNTHROID) 75 MCG tablet Take 1 tablet (75 mcg total) by mouth daily before breakfast. 30 tablet 3   lidocaine-prilocaine (EMLA) cream Apply to affected area once (Patient taking differently: Apply 1 application topically daily as needed (port access).) 30 g 3   loratadine (CLARITIN) 10 MG tablet Take 10 mg by mouth daily.     Melatonin 3 MG TABS Take by mouth.     ondansetron (ZOFRAN) 8 MG tablet Take 8 mg by mouth every 8 (eight) hours as needed.     ondansetron (ZOFRAN) 8 MG tablet Take 1 tablet (8 mg total) by mouth 2 (two) times daily as needed (Nausea or vomiting). 30 tablet 1   pantoprazole (PROTONIX) 40 MG tablet Take 1 tablet (  40 mg total) by mouth daily. 30 tablet 4   prochlorperazine (COMPAZINE) 10 MG tablet Take 10 mg by mouth every 6 (six) hours as needed.     prochlorperazine (COMPAZINE) 10 MG tablet Take 1 tablet (10 mg total) by mouth every 6 (six) hours as needed (Nausea or vomiting). 30 tablet 1   senna-docusate (SENOKOT-S) 8.6-50 MG tablet Take 2 tablets by mouth at bedtime. 60 tablet 3   vitamin B-12 1000 MCG tablet Take 1 tablet (1,000 mcg total) by mouth daily. 30 tablet 3   No current facility-administered medications for this visit.   Facility-Administered Medications Ordered in Other  Visits  Medication Dose Route Frequency Provider Last Rate Last Admin   sodium chloride flush (NS) 0.9 % injection 10 mL  10 mL Intracatheter PRN Alvy Bimler, Driana Dazey, MD   10 mL at 02/14/21 1447    PHYSICAL EXAMINATION: ECOG PERFORMANCE STATUS: 1 - Symptomatic but completely ambulatory  Vitals:   02/14/21 1153  BP: 127/67  Pulse: 71  Resp: 18  Temp: (!) 97.4 F (36.3 C)  SpO2: 100%   Filed Weights   02/14/21 1153  Weight: 215 lb (97.5 kg)    GENERAL:alert, no distress and comfortable SKIN: She has minor skin infection near her perineum EYES: normal, Conjunctiva are pink and non-injected, sclera clear OROPHARYNX:no exudate, no erythema and lips, buccal mucosa, and tongue normal  NECK: supple, thyroid normal size, non-tender, without nodularity LYMPH:  no palpable lymphadenopathy in the cervical, axillary or inguinal LUNGS: clear to auscultation and percussion with normal breathing effort HEART: regular rate & rhythm and no murmurs and no lower extremity edema ABDOMEN:abdomen soft, non-tender and normal bowel sounds Musculoskeletal:no cyanosis of digits and no clubbing  NEURO: alert & oriented x 3 with fluent speech, no focal motor/sensory deficits  LABORATORY DATA:  I have reviewed the data as listed    Component Value Date/Time   NA 137 02/14/2021 1119   K 4.0 02/14/2021 1119   CL 102 02/14/2021 1119   CO2 22 02/14/2021 1119   GLUCOSE 130 (H) 02/14/2021 1119   BUN 16 02/14/2021 1119   CREATININE 1.00 02/14/2021 1119   CALCIUM 9.7 02/14/2021 1119   PROT 7.1 02/14/2021 1119   ALBUMIN 3.8 02/14/2021 1119   AST 19 02/14/2021 1119   ALT 20 02/14/2021 1119   ALKPHOS 96 02/14/2021 1119   BILITOT 0.3 02/14/2021 1119   GFRNONAA 58 (L) 02/14/2021 1119   GFRAA >60 03/25/2020 1144    No results found for: SPEP, UPEP  Lab Results  Component Value Date   WBC 4.6 02/14/2021   NEUTROABS 2.0 02/14/2021   HGB 11.0 (L) 02/14/2021   HCT 31.8 (L) 02/14/2021   MCV 98.5 02/14/2021    PLT 261 02/14/2021      Chemistry      Component Value Date/Time   NA 137 02/14/2021 1119   K 4.0 02/14/2021 1119   CL 102 02/14/2021 1119   CO2 22 02/14/2021 1119   BUN 16 02/14/2021 1119   CREATININE 1.00 02/14/2021 1119      Component Value Date/Time   CALCIUM 9.7 02/14/2021 1119   ALKPHOS 96 02/14/2021 1119   AST 19 02/14/2021 1119   ALT 20 02/14/2021 1119   BILITOT 0.3 02/14/2021 1119

## 2021-02-14 NOTE — Assessment & Plan Note (Signed)
She had mild intermittent pancytopenia due to treatment We will observe and proceed with treatment without delay

## 2021-02-14 NOTE — Assessment & Plan Note (Signed)
She tolerated single agent Taxol better She has mild recurrent pancytopenia but otherwise stable We will proceed with treatment as schedule I plan to repeat imaging study in September

## 2021-02-14 NOTE — Assessment & Plan Note (Signed)
I recommend short course of antibiotics

## 2021-02-28 ENCOUNTER — Encounter: Payer: Self-pay | Admitting: Hematology and Oncology

## 2021-02-28 ENCOUNTER — Inpatient Hospital Stay: Payer: Medicare Other

## 2021-02-28 ENCOUNTER — Inpatient Hospital Stay: Payer: Medicare Other | Attending: Gynecologic Oncology

## 2021-02-28 ENCOUNTER — Other Ambulatory Visit: Payer: Self-pay

## 2021-02-28 ENCOUNTER — Inpatient Hospital Stay (HOSPITAL_BASED_OUTPATIENT_CLINIC_OR_DEPARTMENT_OTHER): Payer: Medicare Other | Admitting: Hematology and Oncology

## 2021-02-28 VITALS — BP 121/54 | HR 66 | Temp 98.3°F | Resp 18 | Ht 67.0 in | Wt 214.6 lb

## 2021-02-28 DIAGNOSIS — T451X5D Adverse effect of antineoplastic and immunosuppressive drugs, subsequent encounter: Secondary | ICD-10-CM

## 2021-02-28 DIAGNOSIS — Z7901 Long term (current) use of anticoagulants: Secondary | ICD-10-CM | POA: Diagnosis not present

## 2021-02-28 DIAGNOSIS — C561 Malignant neoplasm of right ovary: Secondary | ICD-10-CM | POA: Diagnosis not present

## 2021-02-28 DIAGNOSIS — Z90721 Acquired absence of ovaries, unilateral: Secondary | ICD-10-CM | POA: Diagnosis not present

## 2021-02-28 DIAGNOSIS — Z5111 Encounter for antineoplastic chemotherapy: Secondary | ICD-10-CM | POA: Diagnosis not present

## 2021-02-28 DIAGNOSIS — C786 Secondary malignant neoplasm of retroperitoneum and peritoneum: Secondary | ICD-10-CM | POA: Insufficient documentation

## 2021-02-28 DIAGNOSIS — Z9221 Personal history of antineoplastic chemotherapy: Secondary | ICD-10-CM | POA: Insufficient documentation

## 2021-02-28 DIAGNOSIS — D61818 Other pancytopenia: Secondary | ICD-10-CM | POA: Diagnosis not present

## 2021-02-28 DIAGNOSIS — G620 Drug-induced polyneuropathy: Secondary | ICD-10-CM

## 2021-02-28 DIAGNOSIS — D6481 Anemia due to antineoplastic chemotherapy: Secondary | ICD-10-CM | POA: Diagnosis not present

## 2021-02-28 DIAGNOSIS — I2699 Other pulmonary embolism without acute cor pulmonale: Secondary | ICD-10-CM | POA: Diagnosis not present

## 2021-02-28 DIAGNOSIS — Z79899 Other long term (current) drug therapy: Secondary | ICD-10-CM | POA: Diagnosis not present

## 2021-02-28 DIAGNOSIS — T451X5A Adverse effect of antineoplastic and immunosuppressive drugs, initial encounter: Secondary | ICD-10-CM

## 2021-02-28 DIAGNOSIS — N183 Chronic kidney disease, stage 3 unspecified: Secondary | ICD-10-CM | POA: Diagnosis not present

## 2021-02-28 DIAGNOSIS — Z7189 Other specified counseling: Secondary | ICD-10-CM

## 2021-02-28 LAB — CBC WITH DIFFERENTIAL (CANCER CENTER ONLY)
Abs Immature Granulocytes: 0 10*3/uL (ref 0.00–0.07)
Basophils Absolute: 0 10*3/uL (ref 0.0–0.1)
Basophils Relative: 1 %
Eosinophils Absolute: 0 10*3/uL (ref 0.0–0.5)
Eosinophils Relative: 1 %
HCT: 32.8 % — ABNORMAL LOW (ref 36.0–46.0)
Hemoglobin: 10.8 g/dL — ABNORMAL LOW (ref 12.0–15.0)
Immature Granulocytes: 0 %
Lymphocytes Relative: 39 %
Lymphs Abs: 1.6 10*3/uL (ref 0.7–4.0)
MCH: 32.9 pg (ref 26.0–34.0)
MCHC: 32.9 g/dL (ref 30.0–36.0)
MCV: 100 fL (ref 80.0–100.0)
Monocytes Absolute: 0.5 10*3/uL (ref 0.1–1.0)
Monocytes Relative: 13 %
Neutro Abs: 1.9 10*3/uL (ref 1.7–7.7)
Neutrophils Relative %: 46 %
Platelet Count: 296 10*3/uL (ref 150–400)
RBC: 3.28 MIL/uL — ABNORMAL LOW (ref 3.87–5.11)
RDW: 15.8 % — ABNORMAL HIGH (ref 11.5–15.5)
WBC Count: 4.1 10*3/uL (ref 4.0–10.5)
nRBC: 0 % (ref 0.0–0.2)

## 2021-02-28 LAB — CMP (CANCER CENTER ONLY)
ALT: 22 U/L (ref 0–44)
AST: 23 U/L (ref 15–41)
Albumin: 3.7 g/dL (ref 3.5–5.0)
Alkaline Phosphatase: 99 U/L (ref 38–126)
Anion gap: 11 (ref 5–15)
BUN: 17 mg/dL (ref 8–23)
CO2: 24 mmol/L (ref 22–32)
Calcium: 10.1 mg/dL (ref 8.9–10.3)
Chloride: 104 mmol/L (ref 98–111)
Creatinine: 1.08 mg/dL — ABNORMAL HIGH (ref 0.44–1.00)
GFR, Estimated: 53 mL/min — ABNORMAL LOW (ref >60)
Glucose, Bld: 129 mg/dL — ABNORMAL HIGH (ref 70–99)
Potassium: 4.1 mmol/L (ref 3.5–5.1)
Sodium: 139 mmol/L (ref 135–145)
Total Bilirubin: 0.5 mg/dL (ref 0.3–1.2)
Total Protein: 6.9 g/dL (ref 6.5–8.1)

## 2021-02-28 MED ORDER — FAMOTIDINE 20 MG IN NS 100 ML IVPB
20.0000 mg | Freq: Once | INTRAVENOUS | Status: AC
  Administered 2021-02-28: 20 mg via INTRAVENOUS
  Filled 2021-02-28: qty 100

## 2021-02-28 MED ORDER — SODIUM CHLORIDE 0.9 % IV SOLN
10.0000 mg | Freq: Once | INTRAVENOUS | Status: AC
  Administered 2021-02-28: 10 mg via INTRAVENOUS
  Filled 2021-02-28: qty 10

## 2021-02-28 MED ORDER — SODIUM CHLORIDE 0.9 % IV SOLN
64.0000 mg/m2 | Freq: Once | INTRAVENOUS | Status: AC
  Administered 2021-02-28: 138 mg via INTRAVENOUS
  Filled 2021-02-28: qty 23

## 2021-02-28 MED ORDER — HEPARIN SOD (PORK) LOCK FLUSH 100 UNIT/ML IV SOLN
500.0000 [IU] | Freq: Once | INTRAVENOUS | Status: AC | PRN
  Administered 2021-02-28: 500 [IU]

## 2021-02-28 MED ORDER — SODIUM CHLORIDE 0.9% FLUSH
10.0000 mL | Freq: Once | INTRAVENOUS | Status: AC
  Administered 2021-02-28: 10 mL

## 2021-02-28 MED ORDER — DIPHENHYDRAMINE HCL 50 MG/ML IJ SOLN
12.5000 mg | Freq: Once | INTRAMUSCULAR | Status: AC
  Administered 2021-02-28: 12.5 mg via INTRAVENOUS
  Filled 2021-02-28: qty 1

## 2021-02-28 MED ORDER — SODIUM CHLORIDE 0.9% FLUSH
10.0000 mL | INTRAVENOUS | Status: DC | PRN
  Administered 2021-02-28: 10 mL

## 2021-02-28 MED ORDER — SODIUM CHLORIDE 0.9 % IV SOLN: Freq: Once | INTRAVENOUS | Status: AC

## 2021-02-28 NOTE — Assessment & Plan Note (Signed)

## 2021-02-28 NOTE — Assessment & Plan Note (Signed)
She tolerated single agent Taxol better She has mild recurrent pancytopenia but otherwise stable We will proceed with treatment as schedule I plan to repeat imaging study in about 2 weeks for objective assessment of response to therapy

## 2021-02-28 NOTE — Progress Notes (Signed)
[facility] OFFICE PROGRESS NOTE  Patient Care Team: Caryl Bis, MD as PCP - General (Family Medicine) Awanda Mink Craige Cotta, RN as Oncology Nurse Navigator (Oncology)  ASSESSMENT & PLAN:  Right ovarian epithelial cancer [facility]) She tolerated single agent Taxol better She has mild recurrent pancytopenia but otherwise stable We will proceed with treatment as schedule I plan to repeat imaging study in about 2 weeks for objective assessment of response to therapy  CKD (chronic kidney disease), stage III ([place]) She has mild intermittent elevated serum creatinine Continue aggressive fluid hydration and risk factor modification  Anemia due to antineoplastic chemotherapy This is likely due to recent treatment. The patient denies recent history of bleeding such as epistaxis, hematuria or hematochezia. She is asymptomatic from the anemia. I will observe for now.  She does not require transfusion now. I will continue the chemotherapy at current dose without dosage adjustment.  If the anemia gets progressive worse in the future, I might have to delay her treatment or adjust the chemotherapy dose.   Orders Placed This Encounter  Procedures   CT ABDOMEN PELVIS W CONTRAST    Standing Status:   Future    Standing Expiration Date:   02/28/2022    Order Specific Question:   If indicated for the ordered procedure, I authorize the administration of contrast media per Radiology protocol    Answer:   Yes    Order Specific Question:   Preferred imaging location?    Answer:   [facility]    Order Specific Question:   Radiology Contrast Protocol - do NOT remove file path    Answer:   [email or web address]    All questions were answered. The patient knows to call the clinic with any problems, questions or concerns. The total time spent in the appointment was 20 minutes encounter with patients including review of chart and various tests results,  discussions about plan of care and coordination of care plan   [name], MD 02/28/2021 12:44 PM  INTERVAL HISTORY: Please see below for problem oriented charting. she returns for treatment follow-up with weekly Taxol for recurrent ovarian cancer She is recently widowed Her husband finally passed away recently She is coping well Denies peripheral neuropathy from chemotherapy Her recent skin infection has resolved She denies bleeding complications from anticoagulation therapy  REVIEW OF SYSTEMS:   Constitutional: Denies fevers, chills or abnormal weight loss Eyes: Denies blurriness of vision Ears, nose, mouth, throat, and face: Denies mucositis or sore throat Respiratory: Denies cough, dyspnea or wheezes Cardiovascular: Denies palpitation, chest discomfort or lower extremity swelling Gastrointestinal:  Denies nausea, heartburn or change in bowel habits Skin: Denies abnormal skin rashes Lymphatics: Denies new lymphadenopathy or easy bruising Neurological:Denies numbness, tingling or new weaknesses Behavioral/Psych: Mood is stable, no new changes  All other systems were reviewed with the patient and are negative.  I have reviewed the past medical history, past surgical history, social history and family history with the patient and they are unchanged from previous note.  ALLERGIES:  has No Known Allergies.  MEDICATIONS:  Current Outpatient Medications  Medication Sig Dispense Refill   acetaminophen (TYLENOL) 325 MG tablet Take 650 mg by mouth every 6 (six) hours as needed for moderate pain or headache.      amoxicillin (AMOXIL) 500 MG capsule Take 500 mg by mouth in the morning and at bedtime.     apixaban (ELIQUIS) 5 MG TABS tablet Take 1 tablet (5 mg total) by mouth 2 (two) times  daily. --Start around 08/14/20 after you complete your initial starter pack 60 tablet 5   atenolol (TENORMIN) 25 MG tablet TAKE ONE (1) TABLET BY MOUTH EVERY DAY 30 tablet 1   cholecalciferol (VITAMIN  D3) 25 MCG (1000 UNIT) tablet Take 1,000 Units by mouth daily.     levothyroxine (SYNTHROID) 75 MCG tablet Take 1 tablet (75 mcg total) by mouth daily before breakfast. 30 tablet 3   lidocaine-prilocaine (EMLA) cream Apply to affected area once (Patient taking differently: Apply 1 application topically daily as needed (port access).) 30 g 3   loratadine (CLARITIN) 10 MG tablet Take 10 mg by mouth daily.     Melatonin 3 MG TABS Take by mouth.     ondansetron (ZOFRAN) 8 MG tablet Take 8 mg by mouth every 8 (eight) hours as needed.     ondansetron (ZOFRAN) 8 MG tablet Take 1 tablet (8 mg total) by mouth 2 (two) times daily as needed (Nausea or vomiting). 30 tablet 1   pantoprazole (PROTONIX) 40 MG tablet Take 1 tablet (40 mg total) by mouth daily. 30 tablet 4   prochlorperazine (COMPAZINE) 10 MG tablet Take 10 mg by mouth every 6 (six) hours as needed.     prochlorperazine (COMPAZINE) 10 MG tablet Take 1 tablet (10 mg total) by mouth every 6 (six) hours as needed (Nausea or vomiting). 30 tablet 1   senna-docusate (SENOKOT-S) 8.6-50 MG tablet Take 2 tablets by mouth at bedtime. 60 tablet 3   vitamin B-12 1000 MCG tablet Take 1 tablet (1,000 mcg total) by mouth daily. 30 tablet 3   No current facility-administered medications for this visit.   Facility-Administered Medications Ordered in Other Visits  Medication Dose Route Frequency Provider Last Rate Last Admin   dexamethasone (DECADRON) 10 mg in sodium chloride 0.9 % 50 mL IVPB  10 mg Intravenous Once Alvy Bimler, Xavius Spadafore, MD       diphenhydrAMINE (BENADRYL) injection 12.5 mg  12.5 mg Intravenous Once Alvy Bimler, Nikan Ellingson, MD       famotidine (PEPCID) IVPB 20 mg in NS 100 mL IVPB  20 mg Intravenous Once Alvy Bimler, Nikolas Casher, MD       heparin lock flush 100 unit/mL  500 Units Intracatheter Once PRN Alvy Bimler, Beretta Ginsberg, MD       PACLitaxel (TAXOL) 138 mg in sodium chloride 0.9 % 250 mL chemo infusion (</= 58m/m2)  64 mg/m2 (Treatment Plan Recorded) Intravenous Once Tannah Dreyfuss, MD        sodium chloride flush (NS) 0.9 % injection 10 mL  10 mL Intracatheter PRN GAlvy Bimler Teletha Petrea, MD        SUMMARY OF ONCOLOGIC HISTORY: Oncology History Overview Note  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  She has bloating and early satiety. Shortness of breath with walking. She denied bleeding. She reported constipation with pain with defecation and narrowed stools   11/29/2018 Imaging   1. 13 cm complex cystic lesion in the central pelvis, highly suspicious for ovarian cystadenocarcinoma. 2. Diffuse peritoneal carcinomatosis with mild ascites. 3. Mild lymphadenopathy in porta hepatis and right cardiophrenic angle, suspicious for metastatic disease. 4. Moderate right and tiny left pleural effusions   12/05/2018 Tumor Marker   Patient's tumor was tested for the following markers: CA-125 Results of the tumor marker test revealed 1015.   12/06/2018 Cancer Staging   Staging form: Ovary, Fallopian Tube, and Primary Peritoneal Carcinoma, AJCC 8th  Edition - Clinical: FIGO Stage IVA, calculated as Stage IV (cT3c, cN1, cM1) - Signed by [name], MD on 12/06/2018   12/09/2018 Pathology Results   PLEURAL FLUID, RIGHT (SPECIMEN 1 OF 1 COLLECTED 12/09/18): - MALIGNANT CELLS CONSISTENT WITH METASTATIC ADENOCARCINOMA - SEE COMMENT Comment The neoplastic cells are positive for cytokeratin 7 and Pax-8 but negative for cytokeratin 20, TTF-1, CDX-2 and Gata-3. Overall, the phenotype is consistent with the clinical impression of gynecologic primary.    12/15/2018 Procedure   Placement of single lumen port a cath via right internal jugular vein. The catheter tip lies at the cavo-atrial junction. A power injectable port a cath was placed and is ready for immediate use   12/16/2018 - 08/24/2019 Chemotherapy   The patient had carboplatin and taxol for chemotherapy treatment.  She had 7  cycles given neoadjuvant prior to surgery and 3 more cycles after surgery, for a total of 10 cycles of treatment    01/06/2019 Tumor Marker   Patient's tumor was tested for the following markers: CA-125 Results of the tumor marker test revealed 948   02/02/2019 Imaging   1. Massive pulmonary embolism, as discussed above. Given the mildly elevated RV to LV ratio of 0.95, this is associated with increased risk of morbidity and mortality. 2. Today's study demonstrates a mixed response to therapy. Specifically, while there has been regression of the bulky intraperitoneal metastatic disease and regression of previously noted pleural effusions, the large cystic mass in the central pelvis has increased in size compared to the prior study. 3. New right mild hydroureteronephrosis related to extrinsic compression on the distal third of the right ureter by the patient's large pelvic mass. 4. Scattered small pulmonary nodules (predominantly pleural based) appear stable compared to prior examinations. These are nonspecific but warrant continued attention on follow-up studies. 5. Aortic atherosclerosis, in addition to three-vessel coronary artery disease. Assessment for potential risk factor modification, dietary therapy or pharmacologic therapy may be warranted, if clinically indicated.   02/02/2019 - 02/04/2019 Hospital Admission   She was admitted the hospital due to DVT and PE   02/03/2019 Imaging   Bilateral venous Doppler US Right: Findings consistent with acute deep vein thrombosis involving the right femoral vein, right popliteal vein, right peroneal veins, right soleal veins, and right gastrocnemius veins. No cystic structure found in the popliteal fossa. Left: There is no evidence of deep vein thrombosis in the lower extremity. No cystic structure found in the popliteal fossa   02/17/2019 Tumor Marker   Patient's tumor was tested for the following markers: CA-125 Results of the tumor marker test revealed  127   03/10/2019 Tumor Marker   Patient's tumor was tested for the following markers: CA-125. Results of the tumor marker test revealed 87.4   03/14/2019 - 03/16/2019 Hospital Admission   She was admitted to the hospital recently for weakness   03/31/2019 Tumor Marker   Patient's tumor was tested for the following markers: CA-125 Results of the tumor marker test revealed 59.2.   04/28/2019 Tumor Marker   Patient's tumor was tested for the following markers: CA-125 Results of the tumor marker test revealed 56.8   05/05/2019 Imaging   1. Interval decrease in size of the large cystic mass in the central pelvis. Mesenteric and omental soft tissue disease shows no substantial interval change. 2. The mild right hydroureteronephrosis seen previously has resolved in the interval. 3. Small residual nonobstructive thrombus identified in the inter lobar pulmonary artery common here into the lateral wall compatible  with chronicity. 4. 14 mm subtle enhancing lesion in the anterior right liver is stable. This may be vascular malformation. Attention on follow-up recommended. 5. Stable appearance of the multiple small bilateral pulmonary nodules. Continued attention on follow-up recommended. 6.  Aortic Atherosclerois (ICD10-170.0)     06/06/2019 Pathology Results   A. OMENTUM, RESECTION: - Metastatic carcinoma. B. RIGHT FALLOPIAN TUBE AND OVARY, SALPINGOOOPHORECTOMY: - High-grade serous carcinoma, spanning 9 cm. - No surface involvement identified. - Fallopian tube involved by carcinoma. - See oncology table. C. PERITONEAL NODULE, EXCISION: - Metastatic carcinoma. ONCOLOGY TABLE: OVARY or FALLOPIAN TUBE or PRIMARY PERITONEUM: Procedure: Right salpingo-oophorectomy, omental resection, and peritoneal biopsy. Specimen Integrity: Intact Tumor Site: Right ovary Ovarian Surface Involvement (required only if applicable): Not identified Tumor Size: 9 cm Histologic Type: High-grade serous  carcinoma Histologic Grade: High-grade Implants (required for advanced stage serous/seromucinous borderline tumors only): Omentum, peritoneum. Other Tissue/ Organ Involvement: Right fallopian tube Largest Extrapelvic Peritoneal Focus (required only if applicable): 7.3 cm Peritoneal/Ascitic Fluid: Positive pleural fluid pre neoadjuvant therapy. Treatment Effect (required only for high-grade serous carcinomas): Probable treatment effect present. Regional Lymph Nodes: No lymph nodes submitted or found Pathologic Stage Classification (pTNM, AJCC 8th Edition): ypT3c, ypNX Representative Tumor Block: B2   06/06/2019 Surgery   Preoperative Diagnosis: stage IV ovarian cancer, s/p neoadjuvant chemotherapy, history of recent PE.  Procedure(s) Performed: Exploratory laparotomy with right salpingo-oophorectomy, omentectomy radical tumor debulking for ovarian cancer .   Surgeon: Thereasa Solo, MD.    Operative Findings:  Omental cake adherent to anterior abdominal wall and hepatic flexure. 10cm right tube and ovary. Surgically absent uterus and left tube and ovary. Granular nodularity across right diaphragm.    This represented an optimal cytoreduction (R0) with no gross visible disease remaining.    07/06/2019 Tumor Marker   Patient's tumor was tested for the following markers:CA-125 Results of the tumor marker test revealed 16.4   08/10/2019 Genetic Testing   Negative genetic testing. No pathogenic variants identified. VUS in BRCA2 called [id number] identified on the [facility] CancerNext+RNAinsight panel. The report date is 08/10/2019.  The CancerNext+RNAinsight gene panel offered by [employer] includes sequencing and rearrangement analysis for the following 36 genes: APC*, ATM*, AXIN2, BARD1, BMPR1A, BRCA1*, BRCA2*, BRIP1*, CDH1*, CDK4, CDKN2A, CHEK2*, DICER1, MLH1*, MSH2*, MSH3, MSH6*, MUTYH*, NBN, NF1*, NTHL1, PALB2*, PMS2*, PTEN*, RAD51C*, RAD51D*, RECQL, SMAD4, SMARCA4, STK11 and TP53* (sequencing and  deletion/duplication); HOXB13, POLD1 and POLE (sequencing only); EPCAM and GREM1 (deletion/duplication only). DNA and RNA analyses performed for * genes.   HRD (somatic) testing was initially ordered and was not completed due to insufficient amount of tumor sample.    08/24/2019 Tumor Marker   Patient's tumor was tested for the following markers: CA-125 Results of the tumor marker test revealed 12.2   09/20/2019 Imaging   1. Interval debulking, bilateral salpingo oophorectomy and omentectomy with resection of the dominant pelvic cystic lesion and the bandlike soft tissue seen previously in the right mesentery/omentum. Today's study is status shows new postoperative baseline for follow-up. 2. 6 mm gastrohepatic ligament nodule seen on the previous study is stable. Continued attention on follow-up recommended. 3. No new or progressive findings in the chest, abdomen, or pelvis to suggest disease progression. 4. Stable 1.4 cm focus of homogeneous enhancement in the anterior right liver. Stable since at least 11/29/2018 and likely benign. Continued attention on follow-up recommended. 5. Aortic Atherosclerosis (ICD10-I70.0).   09/20/2019 Tumor Marker   Patient's tumor was tested for the following markers: CA-125 Results of the  tumor marker test revealed 11.9   12/26/2019 Tumor Marker   Patient's tumor was tested for the following markers: CA-125 Results of the tumor marker test revealed 16.5   03/25/2020 Imaging   1. Interval progression of peritoneal disease which predominantly involves the serosal surface of the proximal and distal transverse colon, and descending colon. 2. New bilateral inguinal adenopathy. 3. No ascites. 4. Aortic atherosclerosis.   03/25/2020 Tumor Marker   Patient's tumor was tested for the following markers: CA-125 Results of the tumor marker test revealed 249   03/28/2020 Echocardiogram    1. Borderline LV Strain; A2C view represents the most accurate acquisition.. Left  ventricular ejection fraction, by estimation, is 60 to 65%. Left ventricular ejection fraction by PLAX is 63 %. The left ventricle has normal function. The left ventricle demonstrates regional wall motion abnormalities (see scoring diagram/findings for description). Left ventricular diastolic parameters were normal.  2. Right ventricular systolic function is normal. The right ventricular size is normal.  3. Left atrial size was mildly dilated.  4. The mitral valve is normal in structure. No evidence of mitral valve regurgitation.  5. The aortic valve is grossly normal. Aortic valve regurgitation is mild.  6. The inferior vena cava is normal in size with greater than 50% respiratory variability, suggesting right atrial pressure of 3 mmHg.   04/02/2020 Tumor Marker   Patient's tumor was tested for the following markers: CA-125 Results of the tumor marker test revealed 304   04/02/2020 - 08/22/2020 Chemotherapy   The patient had carboplatin and Doxil for chemotherapy treatment.     05/27/2020 Tumor Marker   Patient's tumor was tested for the following markers: CA-125. Results of the tumor marker test revealed 42.7   06/10/2020 Imaging   Improving peritoneal disease and serosal implants along the left colon, as above.   Improving bilateral inguinal nodes.   06/18/2020 Echocardiogram    1. Left ventricular ejection fraction, by estimation, is 60 to 65%. The left ventricle has normal function. The left ventricle has no regional wall motion abnormalities. There is mild left ventricular hypertrophy of the basal-septal segment. Left ventricular diastolic parameters were normal. The average left ventricular global longitudinal strain is -25.0 %. The global longitudinal strain is normal.  2. Right ventricular systolic function is normal. The right ventricular size is normal.  3. The mitral valve is grossly normal. Mild mitral valve regurgitation.  4. Tricuspid valve regurgitation is moderate.  5. The  aortic valve is tricuspid. There is mild calcification of the aortic valve. There is mild thickening of the aortic valve. Aortic valve regurgitation is mild to moderate.  6. The inferior vena cava is normal in size with <50% respiratory variability, suggesting right atrial pressure of 8 mmHg.   06/25/2020 Tumor Marker   Patient's tumor was tested for the following markers: CA-125 Results of the tumor marker test revealed 23.9   07/15/2020 Imaging   IMPRESSION: 1. Mild amount of acute pulmonary embolism within multiple middle lobe and lower lobe branches of the right pulmonary artery. 2.   Small amount of chronic right pulmonary embolism. 3.   Mild posterior left basilar atelectasis and/or infiltrate. 4.   Small left pleural effusion. 5. Aortic atherosclerosis.   Aortic Atherosclerosis (ICD10-I70.0).     07/15/2020 - 07/17/2020 Hospital Admission   She was admitted to a local hospital after presentation with chest pain and shortness of breath and was found to have significant pulmonary embolism She was anticoagulated and discharged   07/16/2020 Echocardiogram  1. Left ventricular ejection fraction, by estimation, is 60 to 65%. The left ventricle has normal function. The left ventricle has no regional wall motion abnormalities. There is mild left ventricular hypertrophy.  Left ventricular diastolic parameters are indeterminate.   2. Right ventricular systolic function is normal. The right ventricular size is normal. There is normal pulmonary artery systolic pressure. The estimated right ventricular systolic pressure is 67.1 mmHg.   3. The mitral valve is grossly normal. Trivial mitral valve regurgitation.   4. The aortic valve is tricuspid. Aortic valve regurgitation is mild.   5. The inferior vena cava is normal in size with greater than 50% respiratory variability, suggesting right atrial pressure of 3 mmHg.    07/25/2020 Tumor Marker   Patient's tumor was tested for the following markers:  CA-125 Results of the tumor marker test revealed 36.4   09/09/2020 Imaging   1. Mild improvement in peritoneal and serosal metastasis along the splenic flexure and descending colon. 2. Inguinal lymph nodes are slightly increased in size. Lymph nodes increased in size from 06/10/2020 and similar to CT of 03/25/2020. 3. No new peritoneal disease.  No free fluid   09/21/2020 - 10/21/2020 Chemotherapy   She started taking Niraparib, discontinued due to intolerable side effects from headache and hypertension       10/01/2020 Tumor Marker   Patient's tumor was tested for the following markers: CA-125 Results of the tumor marker test revealed 83.4.   10/16/2020 Imaging   No acute findings in the abdomen or pelvis.   Hepatic steatosis.   Large stool burden throughout the colon.       12/03/2020 Tumor Marker   Patient's tumor was tested for the following markers: CA-125 Results of the tumor marker test revealed 146   12/20/2020 Imaging   1. Signs of peritoneal carcinomatosis. No significant interval change from previous exam. 2. No findings of solid organ metastasis or nodal metastasis within the abdomen or pelvis. Enlarged right inguinal node is mildly increased in size. Stable enlarged left inguinal node. 3. No evidence for metastatic disease to the chest. 4. Aortic atherosclerosis. Coronary artery calcifications.   12/27/2020 -  Chemotherapy    Patient is on Treatment Plan: OVARIAN PACLITAXEL D1,8,15, Q21 D       02/07/2021 Tumor Marker   Patient's tumor was tested for the following markers: CA-125. Results of the tumor marker test revealed 395.     PHYSICAL EXAMINATION: ECOG PERFORMANCE STATUS: 1 - Symptomatic but completely ambulatory  Vitals:   02/28/21 1215  BP: (!) 121/54  Pulse: 66  Resp: 18  Temp: 98.3 F (36.8 C)  SpO2: 97%   Filed Weights   02/28/21 1215  Weight: 214 lb 9.6 oz (97.3 kg)    GENERAL:alert, no distress and comfortable SKIN: skin color, texture,  turgor are normal, no rashes or significant lesions EYES: normal, Conjunctiva are pink and non-injected, sclera clear OROPHARYNX:no exudate, no erythema and lips, buccal mucosa, and tongue normal  NECK: supple, thyroid normal size, non-tender, without nodularity LYMPH:  no palpable lymphadenopathy in the cervical, axillary or inguinal LUNGS: clear to auscultation and percussion with normal breathing effort HEART: regular rate & rhythm and no murmurs and no lower extremity edema ABDOMEN:abdomen soft, non-tender and normal bowel sounds Musculoskeletal:no cyanosis of digits and no clubbing  NEURO: alert & oriented x 3 with fluent speech, no focal motor/sensory deficits  LABORATORY DATA:  I have reviewed the data as listed    Component Value Date/Time   NA 139  02/28/2021 1152   K 4.1 02/28/2021 1152   CL 104 02/28/2021 1152   CO2 24 02/28/2021 1152   GLUCOSE 129 (H) 02/28/2021 1152   BUN 17 02/28/2021 1152   CREATININE 1.08 (H) 02/28/2021 1152   CALCIUM 10.1 02/28/2021 1152   PROT 6.9 02/28/2021 1152   ALBUMIN 3.7 02/28/2021 1152   AST 23 02/28/2021 1152   ALT 22 02/28/2021 1152   ALKPHOS 99 02/28/2021 1152   BILITOT 0.5 02/28/2021 1152   GFRNONAA 53 (L) 02/28/2021 1152   GFRAA >60 03/25/2020 1144    No results found for: SPEP, UPEP  Lab Results  Component Value Date   WBC 4.1 02/28/2021   NEUTROABS 1.9 02/28/2021   HGB 10.8 (L) 02/28/2021   HCT 32.8 (L) 02/28/2021   MCV 100.0 02/28/2021   PLT 296 02/28/2021      Chemistry      Component Value Date/Time   NA 139 02/28/2021 1152   K 4.1 02/28/2021 1152   CL 104 02/28/2021 1152   CO2 24 02/28/2021 1152   BUN 17 02/28/2021 1152   CREATININE 1.08 (H) 02/28/2021 1152      Component Value Date/Time   CALCIUM 10.1 02/28/2021 1152   ALKPHOS 99 02/28/2021 1152   AST 23 02/28/2021 1152   ALT 22 02/28/2021 1152   BILITOT 0.5 02/28/2021 1152

## 2021-02-28 NOTE — Patient Instructions (Signed)
[facility]  Discharge Instructions: Thank you for choosing [facility] to provide your oncology and hematology care.   If you have a lab appointment with the Cancer Center, please go directly to the Cancer Center and check in at the registration area.   Wear comfortable clothing and clothing appropriate for easy access to any Portacath or PICC line.   We strive to give you quality time with your provider. You may need to reschedule your appointment if you arrive late (15 or more minutes).  Arriving late affects you and other patients whose appointments are after yours.  Also, if you miss three or more appointments without notifying the office, you may be dismissed from the clinic at the provider's discretion.      For prescription refill requests, have your pharmacy contact our office and allow 72 hours for refills to be completed.    Today you received the following chemotherapy and/or immunotherapy agents taxol      To help prevent nausea and vomiting after your treatment, we encourage you to take your nausea medication as directed.  BELOW ARE SYMPTOMS THAT SHOULD BE REPORTED IMMEDIATELY: *FEVER GREATER THAN 100.4 F (38 C) OR HIGHER *CHILLS OR SWEATING *NAUSEA AND VOMITING THAT IS NOT CONTROLLED WITH YOUR NAUSEA MEDICATION *UNUSUAL SHORTNESS OF BREATH *UNUSUAL BRUISING OR BLEEDING *URINARY PROBLEMS (pain or burning when urinating, or frequent urination) *BOWEL PROBLEMS (unusual diarrhea, constipation, pain near the anus) TENDERNESS IN MOUTH AND THROAT WITH OR WITHOUT PRESENCE OF ULCERS (sore throat, sores in mouth, or a toothache) UNUSUAL RASH, SWELLING OR PAIN  UNUSUAL VAGINAL DISCHARGE OR ITCHING   Items with * indicate a potential emergency and should be followed up as soon as possible or go to the Emergency Department if any problems should occur.  Please show the CHEMOTHERAPY ALERT CARD or IMMUNOTHERAPY ALERT CARD at check-in to the  Emergency Department and triage nurse.  Should you have questions after your visit or need to cancel or reschedule your appointment, please contact [facility]  Dept: [phone]  and follow the prompts.  Office hours are 8:00 a.m. to 4:30 p.m. Monday - Friday. Please note that voicemails left after 4:00 p.m. may not be returned until the following business day.  We are closed weekends and major holidays. You have access to a nurse at all times for urgent questions. Please call the main number to the clinic Dept: [phone] and follow the prompts.   For any non-urgent questions, you may also contact your provider using MyChart. We now offer e-Visits for anyone 18 and older to request care online for non-urgent symptoms. For details visit [email or web address].   Also download the MyChart app! Go to the app store, search "MyChart", open the app, select [place], and log in with your MyChart username and password.  Due to Covid, a mask is required upon entering the hospital/clinic. If you do not have a mask, one will be given to you upon arrival. For doctor visits, patients may have 1 support person aged 18 or older with them. For treatment visits, patients cannot have anyone with them due to current Covid guidelines and our immunocompromised population.   

## 2021-02-28 NOTE — Assessment & Plan Note (Signed)
She has mild intermittent elevated serum creatinine Continue aggressive fluid hydration and risk factor modification

## 2021-03-01 LAB — CA 125: Cancer Antigen (CA) 125: 548 U/mL — ABNORMAL HIGH (ref 0.0–38.1)

## 2021-03-06 MED FILL — Dexamethasone Sodium Phosphate Inj 100 MG/10ML: INTRAMUSCULAR | Qty: 1 | Status: AC

## 2021-03-07 ENCOUNTER — Other Ambulatory Visit: Payer: Self-pay

## 2021-03-07 ENCOUNTER — Inpatient Hospital Stay: Payer: Medicare Other

## 2021-03-07 VITALS — BP 143/71 | HR 64 | Temp 98.7°F | Resp 20 | Wt 214.2 lb

## 2021-03-07 DIAGNOSIS — I2699 Other pulmonary embolism without acute cor pulmonale: Secondary | ICD-10-CM | POA: Diagnosis not present

## 2021-03-07 DIAGNOSIS — C561 Malignant neoplasm of right ovary: Secondary | ICD-10-CM

## 2021-03-07 DIAGNOSIS — Z5111 Encounter for antineoplastic chemotherapy: Secondary | ICD-10-CM | POA: Diagnosis not present

## 2021-03-07 DIAGNOSIS — Z7189 Other specified counseling: Secondary | ICD-10-CM

## 2021-03-07 DIAGNOSIS — Z7901 Long term (current) use of anticoagulants: Secondary | ICD-10-CM | POA: Diagnosis not present

## 2021-03-07 DIAGNOSIS — Z79899 Other long term (current) drug therapy: Secondary | ICD-10-CM | POA: Diagnosis not present

## 2021-03-07 DIAGNOSIS — N183 Chronic kidney disease, stage 3 unspecified: Secondary | ICD-10-CM | POA: Diagnosis not present

## 2021-03-07 DIAGNOSIS — D61818 Other pancytopenia: Secondary | ICD-10-CM | POA: Diagnosis not present

## 2021-03-07 DIAGNOSIS — C786 Secondary malignant neoplasm of retroperitoneum and peritoneum: Secondary | ICD-10-CM | POA: Diagnosis not present

## 2021-03-07 DIAGNOSIS — Z9221 Personal history of antineoplastic chemotherapy: Secondary | ICD-10-CM | POA: Diagnosis not present

## 2021-03-07 LAB — CMP (CANCER CENTER ONLY)
ALT: 20 U/L (ref 0–44)
AST: 20 U/L (ref 15–41)
Albumin: 3.5 g/dL (ref 3.5–5.0)
Alkaline Phosphatase: 90 U/L (ref 38–126)
Anion gap: 11 (ref 5–15)
BUN: 18 mg/dL (ref 8–23)
CO2: 23 mmol/L (ref 22–32)
Calcium: 9.8 mg/dL (ref 8.9–10.3)
Chloride: 103 mmol/L (ref 98–111)
Creatinine: 1.04 mg/dL — ABNORMAL HIGH (ref 0.44–1.00)
GFR, Estimated: 55 mL/min — ABNORMAL LOW (ref >60)
Glucose, Bld: 110 mg/dL — ABNORMAL HIGH (ref 70–99)
Potassium: 4.4 mmol/L (ref 3.5–5.1)
Sodium: 137 mmol/L (ref 135–145)
Total Bilirubin: 0.3 mg/dL (ref 0.3–1.2)
Total Protein: 6.9 g/dL (ref 6.5–8.1)

## 2021-03-07 LAB — CBC WITH DIFFERENTIAL (CANCER CENTER ONLY)
Abs Immature Granulocytes: 0.02 10*3/uL (ref 0.00–0.07)
Basophils Absolute: 0 10*3/uL (ref 0.0–0.1)
Basophils Relative: 1 %
Eosinophils Absolute: 0.1 10*3/uL (ref 0.0–0.5)
Eosinophils Relative: 1 %
HCT: 31.9 % — ABNORMAL LOW (ref 36.0–46.0)
Hemoglobin: 10.6 g/dL — ABNORMAL LOW (ref 12.0–15.0)
Immature Granulocytes: 1 %
Lymphocytes Relative: 37 %
Lymphs Abs: 1.5 10*3/uL (ref 0.7–4.0)
MCH: 33 pg (ref 26.0–34.0)
MCHC: 33.2 g/dL (ref 30.0–36.0)
MCV: 99.4 fL (ref 80.0–100.0)
Monocytes Absolute: 0.4 10*3/uL (ref 0.1–1.0)
Monocytes Relative: 10 %
Neutro Abs: 2.1 10*3/uL (ref 1.7–7.7)
Neutrophils Relative %: 50 %
Platelet Count: 299 10*3/uL (ref 150–400)
RBC: 3.21 MIL/uL — ABNORMAL LOW (ref 3.87–5.11)
RDW: 15.5 % (ref 11.5–15.5)
WBC Count: 4.1 10*3/uL (ref 4.0–10.5)
nRBC: 1 % — ABNORMAL HIGH (ref 0.0–0.2)

## 2021-03-07 MED ORDER — SODIUM CHLORIDE 0.9% FLUSH
10.0000 mL | Freq: Once | INTRAVENOUS | Status: AC
  Administered 2021-03-07: 10 mL

## 2021-03-07 MED ORDER — SODIUM CHLORIDE 0.9 % IV SOLN
64.0000 mg/m2 | Freq: Once | INTRAVENOUS | Status: AC
  Administered 2021-03-07: 138 mg via INTRAVENOUS
  Filled 2021-03-07: qty 23

## 2021-03-07 MED ORDER — SODIUM CHLORIDE 0.9 % IV SOLN: Freq: Once | INTRAVENOUS | Status: AC

## 2021-03-07 MED ORDER — FAMOTIDINE 20 MG IN NS 100 ML IVPB
INTRAVENOUS | Status: AC
  Filled 2021-03-07: qty 100

## 2021-03-07 MED ORDER — DIPHENHYDRAMINE HCL 50 MG/ML IJ SOLN
12.5000 mg | Freq: Once | INTRAMUSCULAR | Status: AC
  Administered 2021-03-07: 12.5 mg via INTRAVENOUS
  Filled 2021-03-07: qty 1

## 2021-03-07 MED ORDER — HEPARIN SOD (PORK) LOCK FLUSH 100 UNIT/ML IV SOLN
500.0000 [IU] | Freq: Once | INTRAVENOUS | Status: AC | PRN
  Administered 2021-03-07: 500 [IU]

## 2021-03-07 MED ORDER — SODIUM CHLORIDE 0.9% FLUSH
10.0000 mL | INTRAVENOUS | Status: DC | PRN
  Administered 2021-03-07: 10 mL

## 2021-03-07 MED ORDER — FAMOTIDINE 20 MG IN NS 100 ML IVPB
20.0000 mg | Freq: Once | INTRAVENOUS | Status: AC
Start: 2021-03-07 — End: 2021-03-07
  Administered 2021-03-07: 20 mg via INTRAVENOUS

## 2021-03-07 MED ORDER — SODIUM CHLORIDE 0.9 % IV SOLN
10.0000 mg | Freq: Once | INTRAVENOUS | Status: AC
  Administered 2021-03-07: 10 mg via INTRAVENOUS
  Filled 2021-03-07: qty 10

## 2021-03-07 NOTE — Patient Instructions (Signed)
[facility]  Discharge Instructions: Thank you for choosing [facility] to provide your oncology and hematology care.   If you have a lab appointment with the Cancer Center, please go directly to the Cancer Center and check in at the registration area.   Wear comfortable clothing and clothing appropriate for easy access to any Portacath or PICC line.   We strive to give you quality time with your provider. You may need to reschedule your appointment if you arrive late (15 or more minutes).  Arriving late affects you and other patients whose appointments are after yours.  Also, if you miss three or more appointments without notifying the office, you may be dismissed from the clinic at the provider's discretion.      For prescription refill requests, have your pharmacy contact our office and allow 72 hours for refills to be completed.    Today you received the following chemotherapy and/or immunotherapy agent: Paclitaxel (Taxol)   To help prevent nausea and vomiting after your treatment, we encourage you to take your nausea medication as directed.  BELOW ARE SYMPTOMS THAT SHOULD BE REPORTED IMMEDIATELY: *FEVER GREATER THAN 100.4 F (38 C) OR HIGHER *CHILLS OR SWEATING *NAUSEA AND VOMITING THAT IS NOT CONTROLLED WITH YOUR NAUSEA MEDICATION *UNUSUAL SHORTNESS OF BREATH *UNUSUAL BRUISING OR BLEEDING *URINARY PROBLEMS (pain or burning when urinating, or frequent urination) *BOWEL PROBLEMS (unusual diarrhea, constipation, pain near the anus) TENDERNESS IN MOUTH AND THROAT WITH OR WITHOUT PRESENCE OF ULCERS (sore throat, sores in mouth, or a toothache) UNUSUAL RASH, SWELLING OR PAIN  UNUSUAL VAGINAL DISCHARGE OR ITCHING   Items with * indicate a potential emergency and should be followed up as soon as possible or go to the Emergency Department if any problems should occur.  Please show the CHEMOTHERAPY ALERT CARD or IMMUNOTHERAPY ALERT CARD at  check-in to the Emergency Department and triage nurse.  Should you have questions after your visit or need to cancel or reschedule your appointment, please contact [facility]  Dept: [phone]  and follow the prompts.  Office hours are 8:00 a.m. to 4:30 p.m. Monday - Friday. Please note that voicemails left after 4:00 p.m. may not be returned until the following business day.  We are closed weekends and major holidays. You have access to a nurse at all times for urgent questions. Please call the main number to the clinic Dept: [phone] and follow the prompts.   For any non-urgent questions, you may also contact your provider using MyChart. We now offer e-Visits for anyone 18 and older to request care online for non-urgent symptoms. For details visit [email or web address].   Also download the MyChart app! Go to the app store, search "MyChart", open the app, select [place], and log in with your MyChart username and password.  Due to Covid, a mask is required upon entering the hospital/clinic. If you do not have a mask, one will be given to you upon arrival. For doctor visits, patients may have 1 support person aged 18 or older with them. For treatment visits, patients cannot have anyone with them due to current Covid guidelines and our immunocompromised population.  

## 2021-03-19 ENCOUNTER — Other Ambulatory Visit: Payer: Self-pay

## 2021-03-19 ENCOUNTER — Inpatient Hospital Stay: Payer: Medicare Other

## 2021-03-19 ENCOUNTER — Encounter (HOSPITAL_COMMUNITY): Payer: Self-pay

## 2021-03-19 ENCOUNTER — Ambulatory Visit (HOSPITAL_COMMUNITY)
Admission: RE | Admit: 2021-03-19 | Discharge: 2021-03-19 | Disposition: A | Discharge status: Home / Self Care | Payer: Medicare Other | Source: Ambulatory Visit | Attending: Hematology and Oncology | Admitting: Hematology and Oncology

## 2021-03-19 DIAGNOSIS — C561 Malignant neoplasm of right ovary: Secondary | ICD-10-CM

## 2021-03-19 DIAGNOSIS — Z7189 Other specified counseling: Secondary | ICD-10-CM

## 2021-03-19 DIAGNOSIS — Z7901 Long term (current) use of anticoagulants: Secondary | ICD-10-CM | POA: Diagnosis not present

## 2021-03-19 DIAGNOSIS — I700 Atherosclerosis of aorta: Secondary | ICD-10-CM | POA: Diagnosis not present

## 2021-03-19 DIAGNOSIS — Z5111 Encounter for antineoplastic chemotherapy: Secondary | ICD-10-CM | POA: Diagnosis not present

## 2021-03-19 DIAGNOSIS — Z9221 Personal history of antineoplastic chemotherapy: Secondary | ICD-10-CM | POA: Diagnosis not present

## 2021-03-19 DIAGNOSIS — D61818 Other pancytopenia: Secondary | ICD-10-CM | POA: Diagnosis not present

## 2021-03-19 DIAGNOSIS — Z79899 Other long term (current) drug therapy: Secondary | ICD-10-CM | POA: Diagnosis not present

## 2021-03-19 DIAGNOSIS — C786 Secondary malignant neoplasm of retroperitoneum and peritoneum: Secondary | ICD-10-CM | POA: Diagnosis not present

## 2021-03-19 DIAGNOSIS — I2699 Other pulmonary embolism without acute cor pulmonale: Secondary | ICD-10-CM | POA: Diagnosis not present

## 2021-03-19 DIAGNOSIS — R590 Localized enlarged lymph nodes: Secondary | ICD-10-CM | POA: Diagnosis not present

## 2021-03-19 DIAGNOSIS — N183 Chronic kidney disease, stage 3 unspecified: Secondary | ICD-10-CM | POA: Diagnosis not present

## 2021-03-19 DIAGNOSIS — R1909 Other intra-abdominal and pelvic swelling, mass and lump: Secondary | ICD-10-CM | POA: Diagnosis not present

## 2021-03-19 LAB — CBC WITH DIFFERENTIAL (CANCER CENTER ONLY)
Abs Immature Granulocytes: 0 10*3/uL (ref 0.00–0.07)
Basophils Absolute: 0 10*3/uL (ref 0.0–0.1)
Basophils Relative: 1 %
Eosinophils Absolute: 0.1 10*3/uL (ref 0.0–0.5)
Eosinophils Relative: 1 %
HCT: 33.4 % — ABNORMAL LOW (ref 36.0–46.0)
Hemoglobin: 11.2 g/dL — ABNORMAL LOW (ref 12.0–15.0)
Immature Granulocytes: 0 %
Lymphocytes Relative: 35 %
Lymphs Abs: 1.3 10*3/uL (ref 0.7–4.0)
MCH: 33.8 pg (ref 26.0–34.0)
MCHC: 33.5 g/dL (ref 30.0–36.0)
MCV: 100.9 fL — ABNORMAL HIGH (ref 80.0–100.0)
Monocytes Absolute: 0.6 10*3/uL (ref 0.1–1.0)
Monocytes Relative: 15 %
Neutro Abs: 1.8 10*3/uL (ref 1.7–7.7)
Neutrophils Relative %: 48 %
Platelet Count: 294 10*3/uL (ref 150–400)
RBC: 3.31 MIL/uL — ABNORMAL LOW (ref 3.87–5.11)
RDW: 16.9 % — ABNORMAL HIGH (ref 11.5–15.5)
WBC Count: 3.7 10*3/uL — ABNORMAL LOW (ref 4.0–10.5)
nRBC: 0 % (ref 0.0–0.2)

## 2021-03-19 LAB — CMP (CANCER CENTER ONLY)
ALT: 19 U/L (ref 0–44)
AST: 19 U/L (ref 15–41)
Albumin: 3.7 g/dL (ref 3.5–5.0)
Alkaline Phosphatase: 104 U/L (ref 38–126)
Anion gap: 12 (ref 5–15)
BUN: 19 mg/dL (ref 8–23)
CO2: 25 mmol/L (ref 22–32)
Calcium: 10.4 mg/dL — ABNORMAL HIGH (ref 8.9–10.3)
Chloride: 103 mmol/L (ref 98–111)
Creatinine: 0.99 mg/dL (ref 0.44–1.00)
GFR, Estimated: 59 mL/min — ABNORMAL LOW (ref >60)
Glucose, Bld: 101 mg/dL — ABNORMAL HIGH (ref 70–99)
Potassium: 4.5 mmol/L (ref 3.5–5.1)
Sodium: 140 mmol/L (ref 135–145)
Total Bilirubin: 0.5 mg/dL (ref 0.3–1.2)
Total Protein: 7.2 g/dL (ref 6.5–8.1)

## 2021-03-19 MED ORDER — SODIUM CHLORIDE 0.9% FLUSH
10.0000 mL | Freq: Once | INTRAVENOUS | Status: AC
  Administered 2021-03-19: 10 mL

## 2021-03-19 MED ORDER — HEPARIN SOD (PORK) LOCK FLUSH 100 UNIT/ML IV SOLN
500.0000 [IU] | Freq: Once | INTRAVENOUS | Status: AC
  Administered 2021-03-19: 500 [IU]

## 2021-03-19 MED ORDER — IOHEXOL 350 MG/ML SOLN
100.0000 mL | Freq: Once | INTRAVENOUS | Status: AC | PRN
  Administered 2021-03-19: 80 mL via INTRAVENOUS

## 2021-03-19 NOTE — Progress Notes (Signed)
Pt had no blood return today, she is aware when she comes on infusion day that she may need cathflow if there is still no blood return. Labs will be drawn peripherally.

## 2021-03-20 MED FILL — Dexamethasone Sodium Phosphate Inj 100 MG/10ML: INTRAMUSCULAR | Qty: 1 | Status: AC

## 2021-03-21 ENCOUNTER — Other Ambulatory Visit: Payer: Self-pay

## 2021-03-21 ENCOUNTER — Inpatient Hospital Stay (HOSPITAL_BASED_OUTPATIENT_CLINIC_OR_DEPARTMENT_OTHER): Payer: Medicare Other | Admitting: Hematology and Oncology

## 2021-03-21 ENCOUNTER — Encounter: Payer: Self-pay | Admitting: Hematology and Oncology

## 2021-03-21 ENCOUNTER — Inpatient Hospital Stay: Payer: Medicare Other

## 2021-03-21 DIAGNOSIS — I2699 Other pulmonary embolism without acute cor pulmonale: Secondary | ICD-10-CM | POA: Diagnosis not present

## 2021-03-21 DIAGNOSIS — Z7901 Long term (current) use of anticoagulants: Secondary | ICD-10-CM | POA: Diagnosis not present

## 2021-03-21 DIAGNOSIS — Z5111 Encounter for antineoplastic chemotherapy: Secondary | ICD-10-CM | POA: Diagnosis not present

## 2021-03-21 DIAGNOSIS — N183 Chronic kidney disease, stage 3 unspecified: Secondary | ICD-10-CM | POA: Diagnosis not present

## 2021-03-21 DIAGNOSIS — Z7189 Other specified counseling: Secondary | ICD-10-CM

## 2021-03-21 DIAGNOSIS — C786 Secondary malignant neoplasm of retroperitoneum and peritoneum: Secondary | ICD-10-CM | POA: Diagnosis not present

## 2021-03-21 DIAGNOSIS — C561 Malignant neoplasm of right ovary: Secondary | ICD-10-CM | POA: Diagnosis not present

## 2021-03-21 DIAGNOSIS — Z9221 Personal history of antineoplastic chemotherapy: Secondary | ICD-10-CM | POA: Diagnosis not present

## 2021-03-21 DIAGNOSIS — D61818 Other pancytopenia: Secondary | ICD-10-CM | POA: Diagnosis not present

## 2021-03-21 DIAGNOSIS — Z79899 Other long term (current) drug therapy: Secondary | ICD-10-CM | POA: Diagnosis not present

## 2021-03-21 NOTE — Progress Notes (Signed)
[facility] OFFICE PROGRESS NOTE  Patient Care Team: Caryl Bis, MD as PCP - General (Family Medicine) Awanda Mink Craige Cotta, RN as Oncology Nurse Navigator (Oncology)  ASSESSMENT & PLAN:  Right ovarian epithelial cancer [facility]) I have reviewed CT imaging with the patient and family Unfortunately, she has developed disease progression Thankfully, she is not symptomatic We discussed the risk and benefits of further treatment We discussed goals of care Previously, we discontinue carboplatin and doxorubicin due to severe pancytopenia and difficulties tolerating treatment Technically, she did not progress on that regimen We discussed the risk and benefits of pancytopenia, carboplatin allergy, infection as well as cardiomyopathy with resumption of treatment with carboplatin and doxorubicin  Alternatively, we can also try single agent topotecan After long discussion, the patient is undecided but she has made commitment to call me Monday morning for her final decision about how to proceed  Goals of care, counseling/discussion We have significant discussions about goals of care She is aware that treatment goal is palliative intent I estimated chemotherapy benefit in the region of 25% with estimated benefit measuring months and not years  No orders of the defined types were placed in this encounter.   All questions were answered. The patient knows to call the clinic with any problems, questions or concerns. The total time spent in the appointment was 40 minutes encounter with patients including review of chart and various tests results, discussions about plan of care and coordination of care plan   [name], MD 03/21/2021 12:32 PM  INTERVAL HISTORY: Please see below for problem oriented charting. she returns for treatment follow-up, on Taxol for recurrent ovarian cancer Since last time I saw her, she have no new symptoms Specifically, denies abdominal pain, nausea, bloating or  changes in bowel habits She has no bleeding complications from anticoagulation therapy Her daughter is available over the phone to discuss test results  REVIEW OF SYSTEMS:   Constitutional: Denies fevers, chills or abnormal weight loss Eyes: Denies blurriness of vision Ears, nose, mouth, throat, and face: Denies mucositis or sore throat Respiratory: Denies cough, dyspnea or wheezes Cardiovascular: Denies palpitation, chest discomfort or lower extremity swelling Gastrointestinal:  Denies nausea, heartburn or change in bowel habits Skin: Denies abnormal skin rashes Lymphatics: Denies new lymphadenopathy or easy bruising Neurological:Denies numbness, tingling or new weaknesses Behavioral/Psych: Mood is stable, no new changes  All other systems were reviewed with the patient and are negative.  I have reviewed the past medical history, past surgical history, social history and family history with the patient and they are unchanged from previous note.  ALLERGIES:  has No Known Allergies.  MEDICATIONS:  Current Outpatient Medications  Medication Sig Dispense Refill   acetaminophen (TYLENOL) 325 MG tablet Take 650 mg by mouth every 6 (six) hours as needed for moderate pain or headache.      amoxicillin (AMOXIL) 500 MG capsule Take 500 mg by mouth in the morning and at bedtime.     apixaban (ELIQUIS) 5 MG TABS tablet Take 1 tablet (5 mg total) by mouth 2 (two) times daily. --Start around 08/14/20 after you complete your initial starter pack 60 tablet 5   atenolol (TENORMIN) 25 MG tablet TAKE ONE (1) TABLET BY MOUTH EVERY DAY 30 tablet 1   cholecalciferol (VITAMIN D3) 25 MCG (1000 UNIT) tablet Take 1,000 Units by mouth daily.     levothyroxine (SYNTHROID) 75 MCG tablet Take 1 tablet (75 mcg total) by mouth daily before breakfast. 30 tablet 3  lidocaine-prilocaine (EMLA) cream Apply to affected area once (Patient taking differently: Apply 1 application topically daily as needed (port access).) 30  g 3   loratadine (CLARITIN) 10 MG tablet Take 10 mg by mouth daily.     Melatonin 3 MG TABS Take by mouth.     ondansetron (ZOFRAN) 8 MG tablet Take 8 mg by mouth every 8 (eight) hours as needed.     ondansetron (ZOFRAN) 8 MG tablet Take 1 tablet (8 mg total) by mouth 2 (two) times daily as needed (Nausea or vomiting). 30 tablet 1   pantoprazole (PROTONIX) 40 MG tablet Take 1 tablet (40 mg total) by mouth daily. 30 tablet 4   prochlorperazine (COMPAZINE) 10 MG tablet Take 10 mg by mouth every 6 (six) hours as needed.     prochlorperazine (COMPAZINE) 10 MG tablet Take 1 tablet (10 mg total) by mouth every 6 (six) hours as needed (Nausea or vomiting). 30 tablet 1   senna-docusate (SENOKOT-S) 8.6-50 MG tablet Take 2 tablets by mouth at bedtime. 60 tablet 3   vitamin B-12 1000 MCG tablet Take 1 tablet (1,000 mcg total) by mouth daily. 30 tablet 3   No current facility-administered medications for this visit.    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  She has bloating and early satiety. Shortness of breath with walking. She denied bleeding. She reported constipation with pain with defecation and narrowed stools   11/29/2018 Imaging   1. 13 cm complex cystic lesion in the central pelvis, highly suspicious for ovarian cystadenocarcinoma. 2. Diffuse peritoneal carcinomatosis with mild ascites. 3. Mild lymphadenopathy in porta hepatis and right cardiophrenic angle, suspicious for metastatic disease. 4. Moderate right and tiny left pleural effusions   12/05/2018 Tumor Marker   Patient's tumor was tested for the following markers: CA-125 Results of the tumor marker test revealed 1015.   12/06/2018 Cancer Staging   Staging form: Ovary, Fallopian Tube, and Primary Peritoneal Carcinoma, AJCC  8th Edition - Clinical: FIGO Stage IVA, calculated as Stage IV (cT3c, cN1, cM1) - Signed by [name], MD on 12/06/2018   12/09/2018 Pathology Results   PLEURAL FLUID, RIGHT (SPECIMEN 1 OF 1 COLLECTED 12/09/18): - MALIGNANT CELLS CONSISTENT WITH METASTATIC ADENOCARCINOMA - SEE COMMENT Comment The neoplastic cells are positive for cytokeratin 7 and Pax-8 but negative for cytokeratin 20, TTF-1, CDX-2 and Gata-3. Overall, the phenotype is consistent with the clinical impression of gynecologic primary.    12/15/2018 Procedure   Placement of single lumen port a cath via right internal jugular vein. The catheter tip lies at the cavo-atrial junction. A power injectable port a cath was placed and is ready for immediate use   12/16/2018 - 08/24/2019 Chemotherapy   The patient had carboplatin and taxol for chemotherapy treatment.  She had 7 cycles given neoadjuvant prior to surgery and 3 more cycles after surgery, for a total of 10 cycles of treatment    01/06/2019 Tumor Marker   Patient's tumor was tested for the following markers: CA-125 Results of the tumor marker test revealed 948   02/02/2019 Imaging   1. Massive pulmonary embolism, as discussed above. Given the mildly elevated RV to LV ratio of 0.95, this is associated with increased risk of morbidity and mortality. 2. Today's study demonstrates a mixed response to therapy. Specifically, while there has  been regression of the bulky intraperitoneal metastatic disease and regression of previously noted pleural effusions, the large cystic mass in the central pelvis has increased in size compared to the prior study. 3. New right mild hydroureteronephrosis related to extrinsic compression on the distal third of the right ureter by the patient's large pelvic mass. 4. Scattered small pulmonary nodules (predominantly pleural based) appear stable compared to prior examinations. These are nonspecific but warrant continued attention on follow-up studies. 5.  Aortic atherosclerosis, in addition to three-vessel coronary artery disease. Assessment for potential risk factor modification, dietary therapy or pharmacologic therapy may be warranted, if clinically indicated.   02/02/2019 - 02/04/2019 Hospital Admission   She was admitted the hospital due to DVT and PE   02/03/2019 Imaging   Bilateral venous Doppler US Right: Findings consistent with acute deep vein thrombosis involving the right femoral vein, right popliteal vein, right peroneal veins, right soleal veins, and right gastrocnemius veins. No cystic structure found in the popliteal fossa. Left: There is no evidence of deep vein thrombosis in the lower extremity. No cystic structure found in the popliteal fossa   02/17/2019 Tumor Marker   Patient's tumor was tested for the following markers: CA-125 Results of the tumor marker test revealed 127   03/10/2019 Tumor Marker   Patient's tumor was tested for the following markers: CA-125. Results of the tumor marker test revealed 87.4   03/14/2019 - 03/16/2019 Hospital Admission   She was admitted to the hospital recently for weakness   03/31/2019 Tumor Marker   Patient's tumor was tested for the following markers: CA-125 Results of the tumor marker test revealed 59.2.   04/28/2019 Tumor Marker   Patient's tumor was tested for the following markers: CA-125 Results of the tumor marker test revealed 56.8   05/05/2019 Imaging   1. Interval decrease in size of the large cystic mass in the central pelvis. Mesenteric and omental soft tissue disease shows no substantial interval change. 2. The mild right hydroureteronephrosis seen previously has resolved in the interval. 3. Small residual nonobstructive thrombus identified in the inter lobar pulmonary artery common here into the lateral wall compatible with chronicity. 4. 14 mm subtle enhancing lesion in the anterior right liver is stable. This may be vascular malformation. Attention on follow-up  recommended. 5. Stable appearance of the multiple small bilateral pulmonary nodules. Continued attention on follow-up recommended. 6.  Aortic Atherosclerois (ICD10-170.0)     06/06/2019 Pathology Results   A. OMENTUM, RESECTION: - Metastatic carcinoma. B. RIGHT FALLOPIAN TUBE AND OVARY, SALPINGOOOPHORECTOMY: - High-grade serous carcinoma, spanning 9 cm. - No surface involvement identified. - Fallopian tube involved by carcinoma. - See oncology table. C. PERITONEAL NODULE, EXCISION: - Metastatic carcinoma. ONCOLOGY TABLE: OVARY or FALLOPIAN TUBE or PRIMARY PERITONEUM: Procedure: Right salpingo-oophorectomy, omental resection, and peritoneal biopsy. Specimen Integrity: Intact Tumor Site: Right ovary Ovarian Surface Involvement (required only if applicable): Not identified Tumor Size: 9 cm Histologic Type: High-grade serous carcinoma Histologic Grade: High-grade Implants (required for advanced stage serous/seromucinous borderline tumors only): Omentum, peritoneum. Other Tissue/ Organ Involvement: Right fallopian tube Largest Extrapelvic Peritoneal Focus (required only if applicable): 7.3 cm Peritoneal/Ascitic Fluid: Positive pleural fluid pre neoadjuvant therapy. Treatment Effect (required only for high-grade serous carcinomas): Probable treatment effect present. Regional Lymph Nodes: No lymph nodes submitted or found Pathologic Stage Classification (pTNM, AJCC 8th Edition): ypT3c, ypNX Representative Tumor Block: B2   06/06/2019 Surgery   Preoperative Diagnosis: stage IV ovarian cancer, s/p neoadjuvant chemotherapy, history of recent PE.  Procedure(s) Performed:  Exploratory laparotomy with right salpingo-oophorectomy, omentectomy radical tumor debulking for ovarian cancer .   Surgeon: Thereasa Solo, MD.    Operative Findings:  Omental cake adherent to anterior abdominal wall and hepatic flexure. 10cm right tube and ovary. Surgically absent uterus and left tube and ovary. Granular  nodularity across right diaphragm.    This represented an optimal cytoreduction (R0) with no gross visible disease remaining.    07/06/2019 Tumor Marker   Patient's tumor was tested for the following markers:CA-125 Results of the tumor marker test revealed 16.4   08/10/2019 Genetic Testing   Negative genetic testing. No pathogenic variants identified. VUS in BRCA2 called [id number] identified on the [facility] CancerNext+RNAinsight panel. The report date is 08/10/2019.  The CancerNext+RNAinsight gene panel offered by [facility] includes sequencing and rearrangement analysis for the following 36 genes: APC*, ATM*, AXIN2, BARD1, BMPR1A, BRCA1*, BRCA2*, BRIP1*, CDH1*, CDK4, CDKN2A, CHEK2*, DICER1, MLH1*, MSH2*, MSH3, MSH6*, MUTYH*, NBN, NF1*, NTHL1, PALB2*, PMS2*, PTEN*, RAD51C*, RAD51D*, RECQL, SMAD4, SMARCA4, STK11 and TP53* (sequencing and deletion/duplication); HOXB13, POLD1 and POLE (sequencing only); EPCAM and GREM1 (deletion/duplication only). DNA and RNA analyses performed for * genes.   HRD (somatic) testing was initially ordered and was not completed due to insufficient amount of tumor sample.    08/24/2019 Tumor Marker   Patient's tumor was tested for the following markers: CA-125 Results of the tumor marker test revealed 12.2   09/20/2019 Imaging   1. Interval debulking, bilateral salpingo oophorectomy and omentectomy with resection of the dominant pelvic cystic lesion and the bandlike soft tissue seen previously in the right mesentery/omentum. Today's study is status shows new postoperative baseline for follow-up. 2. 6 mm gastrohepatic ligament nodule seen on the previous study is stable. Continued attention on follow-up recommended. 3. No new or progressive findings in the chest, abdomen, or pelvis to suggest disease progression. 4. Stable 1.4 cm focus of homogeneous enhancement in the anterior right liver. Stable since at least 11/29/2018 and likely benign. Continued attention on follow-up  recommended. 5. Aortic Atherosclerosis (ICD10-I70.0).   09/20/2019 Tumor Marker   Patient's tumor was tested for the following markers: CA-125 Results of the tumor marker test revealed 11.9   12/26/2019 Tumor Marker   Patient's tumor was tested for the following markers: CA-125 Results of the tumor marker test revealed 16.5   03/25/2020 Imaging   1. Interval progression of peritoneal disease which predominantly involves the serosal surface of the proximal and distal transverse colon, and descending colon. 2. New bilateral inguinal adenopathy. 3. No ascites. 4. Aortic atherosclerosis.   03/25/2020 Tumor Marker   Patient's tumor was tested for the following markers: CA-125 Results of the tumor marker test revealed 249   03/28/2020 Echocardiogram    1. Borderline LV Strain; A2C view represents the most accurate acquisition.. Left ventricular ejection fraction, by estimation, is 60 to 65%. Left ventricular ejection fraction by PLAX is 63 %. The left ventricle has normal function. The left ventricle demonstrates regional wall motion abnormalities (see scoring diagram/findings for description). Left ventricular diastolic parameters were normal.  2. Right ventricular systolic function is normal. The right ventricular size is normal.  3. Left atrial size was mildly dilated.  4. The mitral valve is normal in structure. No evidence of mitral valve regurgitation.  5. The aortic valve is grossly normal. Aortic valve regurgitation is mild.  6. The inferior vena cava is normal in size with greater than 50% respiratory variability, suggesting right atrial pressure of 3 mmHg.   04/02/2020 Tumor Marker  Patient's tumor was tested for the following markers: CA-125 Results of the tumor marker test revealed 304   04/02/2020 - 08/22/2020 Chemotherapy   The patient had carboplatin and Doxil for chemotherapy treatment.     05/27/2020 Tumor Marker   Patient's tumor was tested for the following markers:  CA-125. Results of the tumor marker test revealed 42.7   06/10/2020 Imaging   Improving peritoneal disease and serosal implants along the left colon, as above.   Improving bilateral inguinal nodes.   06/18/2020 Echocardiogram    1. Left ventricular ejection fraction, by estimation, is 60 to 65%. The left ventricle has normal function. The left ventricle has no regional wall motion abnormalities. There is mild left ventricular hypertrophy of the basal-septal segment. Left ventricular diastolic parameters were normal. The average left ventricular global longitudinal strain is -25.0 %. The global longitudinal strain is normal.  2. Right ventricular systolic function is normal. The right ventricular size is normal.  3. The mitral valve is grossly normal. Mild mitral valve regurgitation.  4. Tricuspid valve regurgitation is moderate.  5. The aortic valve is tricuspid. There is mild calcification of the aortic valve. There is mild thickening of the aortic valve. Aortic valve regurgitation is mild to moderate.  6. The inferior vena cava is normal in size with <50% respiratory variability, suggesting right atrial pressure of 8 mmHg.   06/25/2020 Tumor Marker   Patient's tumor was tested for the following markers: CA-125 Results of the tumor marker test revealed 23.9   07/15/2020 Imaging   IMPRESSION: 1. Mild amount of acute pulmonary embolism within multiple middle lobe and lower lobe branches of the right pulmonary artery. 2.   Small amount of chronic right pulmonary embolism. 3.   Mild posterior left basilar atelectasis and/or infiltrate. 4.   Small left pleural effusion. 5. Aortic atherosclerosis.   Aortic Atherosclerosis (ICD10-I70.0).     07/15/2020 - 07/17/2020 Hospital Admission   She was admitted to a local hospital after presentation with chest pain and shortness of breath and was found to have significant pulmonary embolism She was anticoagulated and discharged   07/16/2020  Echocardiogram   1. Left ventricular ejection fraction, by estimation, is 60 to 65%. The left ventricle has normal function. The left ventricle has no regional wall motion abnormalities. There is mild left ventricular hypertrophy.  Left ventricular diastolic parameters are indeterminate.   2. Right ventricular systolic function is normal. The right ventricular size is normal. There is normal pulmonary artery systolic pressure. The estimated right ventricular systolic pressure is 34.7 mmHg.   3. The mitral valve is grossly normal. Trivial mitral valve regurgitation.   4. The aortic valve is tricuspid. Aortic valve regurgitation is mild.   5. The inferior vena cava is normal in size with greater than 50% respiratory variability, suggesting right atrial pressure of 3 mmHg.    07/25/2020 Tumor Marker   Patient's tumor was tested for the following markers: CA-125 Results of the tumor marker test revealed 36.4   09/09/2020 Imaging   1. Mild improvement in peritoneal and serosal metastasis along the splenic flexure and descending colon. 2. Inguinal lymph nodes are slightly increased in size. Lymph nodes increased in size from 06/10/2020 and similar to CT of 03/25/2020. 3. No new peritoneal disease.  No free fluid   09/21/2020 - 10/21/2020 Chemotherapy   She started taking Niraparib, discontinued due to intolerable side effects from headache and hypertension       10/01/2020 Tumor Marker   Patient's tumor was  tested for the following markers: CA-125 Results of the tumor marker test revealed 83.4.   10/16/2020 Imaging   No acute findings in the abdomen or pelvis.   Hepatic steatosis.   Large stool burden throughout the colon.       12/03/2020 Tumor Marker   Patient's tumor was tested for the following markers: CA-125 Results of the tumor marker test revealed 146   12/20/2020 Imaging   1. Signs of peritoneal carcinomatosis. No significant interval change from previous exam. 2. No findings of  solid organ metastasis or nodal metastasis within the abdomen or pelvis. Enlarged right inguinal node is mildly increased in size. Stable enlarged left inguinal node. 3. No evidence for metastatic disease to the chest. 4. Aortic atherosclerosis. Coronary artery calcifications.   12/27/2020 - 03/07/2021 Chemotherapy    Patient is on PACLITAXEL       02/07/2021 Tumor Marker   Patient's tumor was tested for the following markers: CA-125. Results of the tumor marker test revealed 395.   02/28/2021 Tumor Marker   Patient's tumor was tested for the following markers: CA-125. Results of the tumor marker test revealed 548.   03/20/2021 Imaging   1. Interval enlargement of multiple peritoneal soft tissue masses and nodules. 2. Interval enlargement of portacaval lymph nodes as well as slight interval enlargement of bilateral inguinal lymph nodes. 3. Findings are consistent with worsened nodal and peritoneal metastatic disease. 4. Status post hysterectomy.   Aortic Atherosclerosis (ICD10-I70.0).     PHYSICAL EXAMINATION: ECOG PERFORMANCE STATUS: 1 - Symptomatic but completely ambulatory  Vitals:   03/21/21 1004  BP: 132/66  Pulse: 70  Resp: 18  Temp: 97.8 F (36.6 C)  SpO2: 98%   Filed Weights   03/21/21 1004  Weight: 216 lb 12.8 oz (98.3 kg)    GENERAL:alert, no distress and comfortable NEURO: alert & oriented x 3 with fluent speech, no focal motor/sensory deficits  LABORATORY DATA:  I have reviewed the data as listed    Component Value Date/Time   NA 140 03/19/2021 1006   K 4.5 03/19/2021 1006   CL 103 03/19/2021 1006   CO2 25 03/19/2021 1006   GLUCOSE 101 (H) 03/19/2021 1006   BUN 19 03/19/2021 1006   CREATININE 0.99 03/19/2021 1006   CALCIUM 10.4 (H) 03/19/2021 1006   PROT 7.2 03/19/2021 1006   ALBUMIN 3.7 03/19/2021 1006   AST 19 03/19/2021 1006   ALT 19 03/19/2021 1006   ALKPHOS 104 03/19/2021 1006   BILITOT 0.5 03/19/2021 1006   GFRNONAA 59 (L) 03/19/2021 1006    GFRAA >60 03/25/2020 1144    No results found for: SPEP, UPEP  Lab Results  Component Value Date   WBC 3.7 (L) 03/19/2021   NEUTROABS 1.8 03/19/2021   HGB 11.2 (L) 03/19/2021   HCT 33.4 (L) 03/19/2021   MCV 100.9 (H) 03/19/2021   PLT 294 03/19/2021      Chemistry      Component Value Date/Time   NA 140 03/19/2021 1006   K 4.5 03/19/2021 1006   CL 103 03/19/2021 1006   CO2 25 03/19/2021 1006   BUN 19 03/19/2021 1006   CREATININE 0.99 03/19/2021 1006      Component Value Date/Time   CALCIUM 10.4 (H) 03/19/2021 1006   ALKPHOS 104 03/19/2021 1006   AST 19 03/19/2021 1006   ALT 19 03/19/2021 1006   BILITOT 0.5 03/19/2021 1006       RADIOGRAPHIC STUDIES: I have reviewed multiple imaging studies with the patient  I have personally reviewed the radiological images as listed and agreed with the findings in the report. CT ABDOMEN PELVIS W CONTRAST  Result Date: 03/20/2021 CLINICAL DATA:  Ovarian cancer, ongoing chemotherapy, assess treatment response EXAM: CT ABDOMEN AND PELVIS WITH CONTRAST TECHNIQUE: Multidetector CT imaging of the abdomen and pelvis was performed using the standard protocol following bolus administration of intravenous contrast. CONTRAST:  67m OMNIPAQUE IOHEXOL 350 MG/ML SOLN, additional oral enteric contrast COMPARISON:  12/19/2020 FINDINGS: Lower chest: No acute abnormality. Hepatobiliary: No solid liver abnormality is seen. No gallstones, gallbladder wall thickening, or biliary dilatation. Pancreas: Unremarkable. No pancreatic ductal dilatation or surrounding inflammatory changes. Spleen: Normal in size without significant abnormality. Adrenals/Urinary Tract: Adrenal glands are unremarkable. Kidneys are normal, without renal calculi, solid lesion, or hydronephrosis. Bladder is unremarkable. Stomach/Bowel: Stomach is within normal limits. Appendix appears normal. No evidence of bowel wall thickening, distention, or inflammatory changes. Vascular/Lymphatic: Aortic  atherosclerosis. Interval enlargement of portacaval lymph nodes, largest node measuring 2.0 x 1.5 cm, previously 1.1 x 0.8 cm (series 2, image 22). Slight interval enlargement of bilateral inguinal lymph nodes, right sided node measuring 2.2 x 2.0 cm, previously 1.8 x 1.7 cm (series 2, image 74). Reproductive: Status post hysterectomy.  Pessary in the vagina. Other: No abdominal wall hernia or abnormality. No abdominopelvic ascites. Slight interval enlargement of occasional peritoneal soft tissue nodules, for example in the left upper quadrant a 0.8 cm nodule, previously 0.5 cm (series 2, image 18). Interval enlargement of soft tissue masses of the ventral right peritoneum, measuring 4.6 x 1.4 cm, previously 3.2 x 1.0 cm (series 2, image 44) and 6.0 x 1.9 cm, previously 4.7 x 1.1 cm (series 2, image 47). Interval enlargement of a soft tissue mass of the left pericolic gutter/left pelvic sidewall, abutting the sigmoid colon, measuring 2.5 x 1.8 cm, previously 1.4 x 1.0 cm (series 2, image 68). Musculoskeletal: No acute or significant osseous findings. IMPRESSION: 1. Interval enlargement of multiple peritoneal soft tissue masses and nodules. 2. Interval enlargement of portacaval lymph nodes as well as slight interval enlargement of bilateral inguinal lymph nodes. 3. Findings are consistent with worsened nodal and peritoneal metastatic disease. 4. Status post hysterectomy. Aortic Atherosclerosis (ICD10-I70.0). Electronically Signed   By: AEddie CandleM.D.   On: 03/20/2021 11:34

## 2021-03-21 NOTE — Assessment & Plan Note (Signed)
We have significant discussions about goals of care She is aware that treatment goal is palliative intent I estimated chemotherapy benefit in the region of 25% with estimated benefit measuring months and not years

## 2021-03-21 NOTE — Assessment & Plan Note (Signed)
I have reviewed CT imaging with the patient and family Unfortunately, she has developed disease progression Thankfully, she is not symptomatic We discussed the risk and benefits of further treatment We discussed goals of care Previously, we discontinue carboplatin and doxorubicin due to severe pancytopenia and difficulties tolerating treatment Technically, she did not progress on that regimen We discussed the risk and benefits of pancytopenia, carboplatin allergy, infection as well as cardiomyopathy with resumption of treatment with carboplatin and doxorubicin  Alternatively, we can also try single agent topotecan After long discussion, the patient is undecided but she has made commitment to call me Monday morning for her final decision about how to proceed

## 2021-03-24 ENCOUNTER — Telehealth: Payer: Self-pay

## 2021-03-24 ENCOUNTER — Other Ambulatory Visit: Payer: Self-pay | Admitting: Hematology and Oncology

## 2021-03-24 DIAGNOSIS — Z7189 Other specified counseling: Secondary | ICD-10-CM

## 2021-03-24 DIAGNOSIS — C561 Malignant neoplasm of right ovary: Secondary | ICD-10-CM

## 2021-03-24 NOTE — Telephone Encounter (Signed)
She called and left a message. She has decided on chemo after appt from last week. She has chosen to take Carboplatin and Doxil.

## 2021-03-24 NOTE — Progress Notes (Signed)
DISCONTINUE OFF PATHWAY REGIMEN - Ovarian   OFF00010:Paclitaxel 80 mg/m2 Weekly:   Administer weekly:     Paclitaxel   **Always confirm dose/schedule in your pharmacy ordering system**  REASON: Disease Progression PRIOR TREATMENT: Off Pathway: Paclitaxel 80 mg/m2 Weekly TREATMENT RESPONSE: Progressive Disease (PD)  START ON PATHWAY REGIMEN - Ovarian     A cycle is every 28 days:     Carboplatin      Liposomal doxorubicin   **Always confirm dose/schedule in your pharmacy ordering system**  Patient Characteristics: Recurrent or Progressive Disease, Third Line, Platinum Sensitive and ? 6 Months Since Last Platinum Therapy BRCA Mutation Status: Absent Therapeutic Status: Recurrent or Progressive Disease Line of Therapy: Third Line  Intent of Therapy: Non-Curative / Palliative Intent, Discussed with Patient

## 2021-03-24 NOTE — Progress Notes (Signed)
OFF PATHWAY REGIMEN - Ovarian  No Change  Continue With Treatment as Ordered.  Original Decision Date/Time: 12/20/2020 10:05   OFF00010:Paclitaxel 80 mg/m2 Weekly:   Administer weekly:     Paclitaxel   **Always confirm dose/schedule in your pharmacy ordering system**  Patient Characteristics: Recurrent or Progressive Disease, Third Line, Platinum Sensitive and ? 6 Months Since Last Platinum Therapy, BRCA Mutation Absent BRCA Mutation Status: Absent Therapeutic Status: Recurrent or Progressive Disease Line of Therapy: Third Line  Intent of Therapy: Non-Curative / Palliative Intent, Discussed with Patient

## 2021-03-24 NOTE — Telephone Encounter (Signed)
Called and given appt for Echo 9/29 at 8 am at [facility]. She verbalized understanding.

## 2021-03-24 NOTE — Telephone Encounter (Signed)
Pls schedule echo to be done latest by Thursday (order is in) I sent scheduling msg for scheduler to call her for chemo appt to start next week

## 2021-03-26 ENCOUNTER — Other Ambulatory Visit: Payer: Self-pay | Admitting: Hematology and Oncology

## 2021-03-27 ENCOUNTER — Other Ambulatory Visit: Payer: Self-pay

## 2021-03-27 ENCOUNTER — Ambulatory Visit (INDEPENDENT_AMBULATORY_CARE_PROVIDER_SITE_OTHER): Payer: Medicare Other

## 2021-03-27 DIAGNOSIS — Z0189 Encounter for other specified special examinations: Secondary | ICD-10-CM | POA: Diagnosis not present

## 2021-03-27 DIAGNOSIS — C561 Malignant neoplasm of right ovary: Secondary | ICD-10-CM | POA: Diagnosis not present

## 2021-03-27 LAB — ECHOCARDIOGRAM COMPLETE
Area-P 1/2: 4.6 cm2
Calc EF: 52.5 %
MV M vel: 4.42 m/s
MV Peak grad: 78.1 mmHg
P 1/2 time: 768 msec
S' Lateral: 2.79 cm
Single Plane A2C EF: 62.9 %
Single Plane A4C EF: 42 %

## 2021-03-28 ENCOUNTER — Inpatient Hospital Stay: Payer: Medicare Other

## 2021-03-28 VITALS — BP 141/62 | HR 65 | Temp 97.9°F | Resp 18 | Wt 216.0 lb

## 2021-03-28 DIAGNOSIS — N183 Chronic kidney disease, stage 3 unspecified: Secondary | ICD-10-CM | POA: Diagnosis not present

## 2021-03-28 DIAGNOSIS — D61818 Other pancytopenia: Secondary | ICD-10-CM | POA: Diagnosis not present

## 2021-03-28 DIAGNOSIS — C561 Malignant neoplasm of right ovary: Secondary | ICD-10-CM

## 2021-03-28 DIAGNOSIS — Z5111 Encounter for antineoplastic chemotherapy: Secondary | ICD-10-CM | POA: Diagnosis not present

## 2021-03-28 DIAGNOSIS — Z9221 Personal history of antineoplastic chemotherapy: Secondary | ICD-10-CM | POA: Diagnosis not present

## 2021-03-28 DIAGNOSIS — Z7901 Long term (current) use of anticoagulants: Secondary | ICD-10-CM | POA: Diagnosis not present

## 2021-03-28 DIAGNOSIS — I2699 Other pulmonary embolism without acute cor pulmonale: Secondary | ICD-10-CM | POA: Diagnosis not present

## 2021-03-28 DIAGNOSIS — Z7189 Other specified counseling: Secondary | ICD-10-CM

## 2021-03-28 DIAGNOSIS — C786 Secondary malignant neoplasm of retroperitoneum and peritoneum: Secondary | ICD-10-CM | POA: Diagnosis not present

## 2021-03-28 DIAGNOSIS — Z79899 Other long term (current) drug therapy: Secondary | ICD-10-CM | POA: Diagnosis not present

## 2021-03-28 LAB — CMP (CANCER CENTER ONLY)
ALT: 17 U/L (ref 0–44)
AST: 19 U/L (ref 15–41)
Albumin: 3.8 g/dL (ref 3.5–5.0)
Alkaline Phosphatase: 113 U/L (ref 38–126)
Anion gap: 13 (ref 5–15)
BUN: 21 mg/dL (ref 8–23)
CO2: 23 mmol/L (ref 22–32)
Calcium: 10 mg/dL (ref 8.9–10.3)
Chloride: 103 mmol/L (ref 98–111)
Creatinine: 1.18 mg/dL — ABNORMAL HIGH (ref 0.44–1.00)
GFR, Estimated: 48 mL/min — ABNORMAL LOW (ref >60)
Glucose, Bld: 102 mg/dL — ABNORMAL HIGH (ref 70–99)
Potassium: 3.9 mmol/L (ref 3.5–5.1)
Sodium: 139 mmol/L (ref 135–145)
Total Bilirubin: 0.3 mg/dL (ref 0.3–1.2)
Total Protein: 7.5 g/dL (ref 6.5–8.1)

## 2021-03-28 LAB — CBC WITH DIFFERENTIAL (CANCER CENTER ONLY)
Abs Immature Granulocytes: 0 10*3/uL (ref 0.00–0.07)
Basophils Absolute: 0 10*3/uL (ref 0.0–0.1)
Basophils Relative: 1 %
Eosinophils Absolute: 0.1 10*3/uL (ref 0.0–0.5)
Eosinophils Relative: 2 %
HCT: 35.1 % — ABNORMAL LOW (ref 36.0–46.0)
Hemoglobin: 11.8 g/dL — ABNORMAL LOW (ref 12.0–15.0)
Immature Granulocytes: 0 %
Lymphocytes Relative: 34 %
Lymphs Abs: 1.4 10*3/uL (ref 0.7–4.0)
MCH: 34.1 pg — ABNORMAL HIGH (ref 26.0–34.0)
MCHC: 33.6 g/dL (ref 30.0–36.0)
MCV: 101.4 fL — ABNORMAL HIGH (ref 80.0–100.0)
Monocytes Absolute: 0.7 10*3/uL (ref 0.1–1.0)
Monocytes Relative: 16 %
Neutro Abs: 2 10*3/uL (ref 1.7–7.7)
Neutrophils Relative %: 47 %
Platelet Count: 340 10*3/uL (ref 150–400)
RBC: 3.46 MIL/uL — ABNORMAL LOW (ref 3.87–5.11)
RDW: 16.2 % — ABNORMAL HIGH (ref 11.5–15.5)
WBC Count: 4.2 10*3/uL (ref 4.0–10.5)
nRBC: 0 % (ref 0.0–0.2)

## 2021-03-28 MED ORDER — HEPARIN SOD (PORK) LOCK FLUSH 100 UNIT/ML IV SOLN
500.0000 [IU] | Freq: Once | INTRAVENOUS | Status: AC | PRN
  Administered 2021-03-28: 500 [IU]

## 2021-03-28 MED ORDER — DEXTROSE 5 % IV SOLN: Freq: Once | INTRAVENOUS | Status: AC

## 2021-03-28 MED ORDER — SODIUM CHLORIDE 0.9% FLUSH
10.0000 mL | INTRAVENOUS | Status: DC | PRN
  Administered 2021-03-28: 10 mL

## 2021-03-28 MED ORDER — SODIUM CHLORIDE 0.9 % IV SOLN
150.0000 mg | Freq: Once | INTRAVENOUS | Status: AC
  Administered 2021-03-28: 150 mg via INTRAVENOUS
  Filled 2021-03-28: qty 150

## 2021-03-28 MED ORDER — PALONOSETRON HCL INJECTION 0.25 MG/5ML
0.2500 mg | Freq: Once | INTRAVENOUS | Status: AC
  Administered 2021-03-28: 0.25 mg via INTRAVENOUS
  Filled 2021-03-28: qty 5

## 2021-03-28 MED ORDER — SODIUM CHLORIDE 0.9 % IV SOLN
10.0000 mg | Freq: Once | INTRAVENOUS | Status: AC
  Administered 2021-03-28: 10 mg via INTRAVENOUS
  Filled 2021-03-28: qty 10

## 2021-03-28 MED ORDER — DOXORUBICIN HCL LIPOSOMAL CHEMO INJECTION 2 MG/ML
30.0000 mg/m2 | Freq: Once | INTRAVENOUS | Status: AC
  Administered 2021-03-28: 64 mg via INTRAVENOUS
  Filled 2021-03-28: qty 25

## 2021-03-28 MED ORDER — ALTEPLASE 2 MG IJ SOLR
2.0000 mg | Freq: Once | INTRAMUSCULAR | Status: AC
  Administered 2021-03-28: 2 mg
  Filled 2021-03-28: qty 2

## 2021-03-28 MED ORDER — SODIUM CHLORIDE 0.9% FLUSH
10.0000 mL | Freq: Once | INTRAVENOUS | Status: AC
  Administered 2021-03-28: 10 mL

## 2021-03-28 MED ORDER — SODIUM CHLORIDE 0.9 % IV SOLN
348.0000 mg | Freq: Once | INTRAVENOUS | Status: AC
  Administered 2021-03-28: 350 mg via INTRAVENOUS
  Filled 2021-03-28: qty 35

## 2021-03-28 NOTE — Patient Instructions (Signed)
[facility]  Discharge Instructions: Thank you for choosing [facility] to provide your oncology and hematology care.   If you have a lab appointment with the Cancer Center, please go directly to the Cancer Center and check in at the registration area.   Wear comfortable clothing and clothing appropriate for easy access to any Portacath or PICC line.   We strive to give you quality time with your provider. You may need to reschedule your appointment if you arrive late (15 or more minutes).  Arriving late affects you and other patients whose appointments are after yours.  Also, if you miss three or more appointments without notifying the office, you may be dismissed from the clinic at the provider's discretion.      For prescription refill requests, have your pharmacy contact our office and allow 72 hours for refills to be completed.    Today you received the following chemotherapy and/or immunotherapy agents:  Doxil & Carboplatin    To help prevent nausea and vomiting after your treatment, we encourage you to take your nausea medication as directed.  BELOW ARE SYMPTOMS THAT SHOULD BE REPORTED IMMEDIATELY: . *FEVER GREATER THAN 100.4 F (38 C) OR HIGHER . *CHILLS OR SWEATING . *NAUSEA AND VOMITING THAT IS NOT CONTROLLED WITH YOUR NAUSEA MEDICATION . *UNUSUAL SHORTNESS OF BREATH . *UNUSUAL BRUISING OR BLEEDING . *URINARY PROBLEMS (pain or burning when urinating, or frequent urination) . *BOWEL PROBLEMS (unusual diarrhea, constipation, pain near the anus) . TENDERNESS IN MOUTH AND THROAT WITH OR WITHOUT PRESENCE OF ULCERS (sore throat, sores in mouth, or a toothache) . UNUSUAL RASH, SWELLING OR PAIN  . UNUSUAL VAGINAL DISCHARGE OR ITCHING   Items with * indicate a potential emergency and should be followed up as soon as possible or go to the Emergency Department if any problems should occur.  Please show the CHEMOTHERAPY ALERT CARD or  IMMUNOTHERAPY ALERT CARD at check-in to the Emergency Department and triage nurse.  Should you have questions after your visit or need to cancel or reschedule your appointment, please contact [facility]  Dept: [phone]  and follow the prompts.  Office hours are 8:00 a.m. to 4:30 p.m. Monday - Friday. Please note that voicemails left after 4:00 p.m. may not be returned until the following business day.  We are closed weekends and major holidays. You have access to a nurse at all times for urgent questions. Please call the main number to the clinic Dept: [phone] and follow the prompts.   For any non-urgent questions, you may also contact your provider using MyChart. We now offer e-Visits for anyone 18 and older to request care online for non-urgent symptoms. For details visit [email or web address].   Also download the MyChart app! Go to the app store, search "MyChart", open the app, select , and log in with your MyChart username and password.  Due to Covid, a mask is required upon entering the hospital/clinic. If you do not have a mask, one will be given to you upon arrival. For doctor visits, patients may have 1 support person aged 18 or older with them. For treatment visits, patients cannot have anyone with them due to current Covid guidelines and our immunocompromised population.   

## 2021-03-30 LAB — CA 125: Cancer Antigen (CA) 125: 991 U/mL — ABNORMAL HIGH (ref 0.0–38.1)

## 2021-04-10 DIAGNOSIS — M26622 Arthralgia of left temporomandibular joint: Secondary | ICD-10-CM | POA: Diagnosis not present

## 2021-04-15 DIAGNOSIS — Z23 Encounter for immunization: Secondary | ICD-10-CM | POA: Diagnosis not present

## 2021-04-21 IMAGING — CT CT ABDOMEN AND PELVIS WITH CONTRAST
1 of 2 series · 14 of 32 positions shown, 18 images · IV contrast (iopamidol)
Comparison: None.

CLINICAL DATA: Abdominal and pelvic pain and bloating.

Creatinine was obtained on site at [HOSPITAL] at [REDACTED].
Results: Creatinine 1.0 mg/dL.
EXAM:
CT ABDOMEN AND PELVIS WITH CONTRAST
TECHNIQUE: Multidetector CT imaging of the abdomen and pelvis was performed
using the standard protocol following bolus administration of
intravenous contrast.
CONTRAST:  100mL 1RI40T-199 IOPAMIDOL (1RI40T-199) INJECTION 61%

[Series 2: abd pelvis 5.00 br40 s3 ax · axial · 0.81mm/px · z∈[+1004,+1439]mm · 14 of 97 slices shown, 18 images]
[im 5/97  soft-tissue]
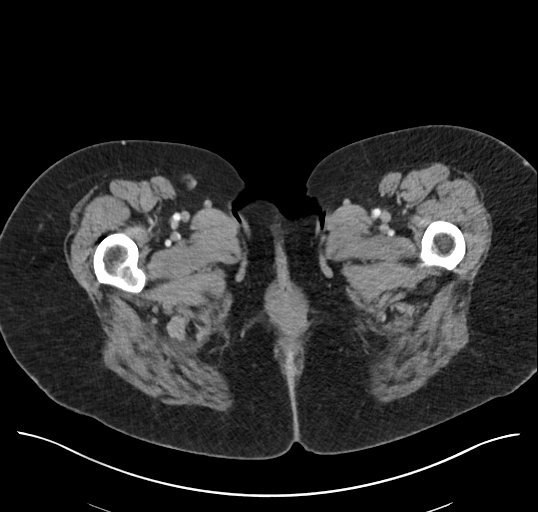
[im 5/97  bone]
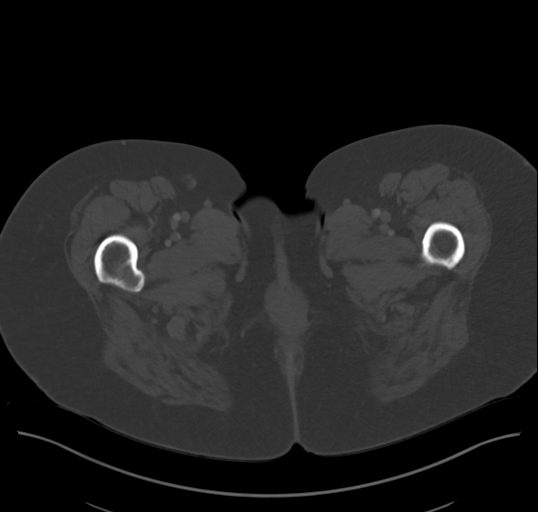
[im 14/97  soft-tissue]
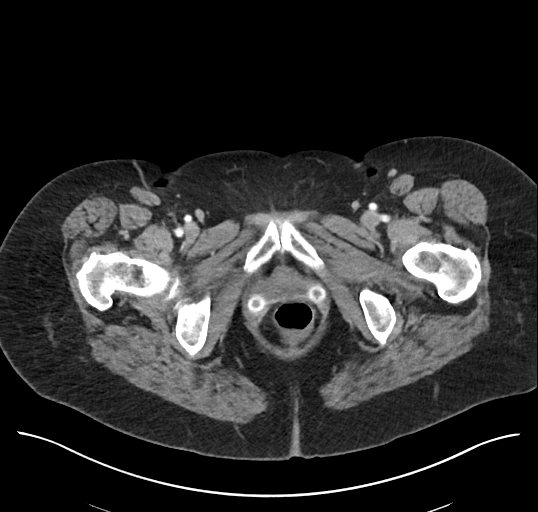
[im 22/97  soft-tissue]
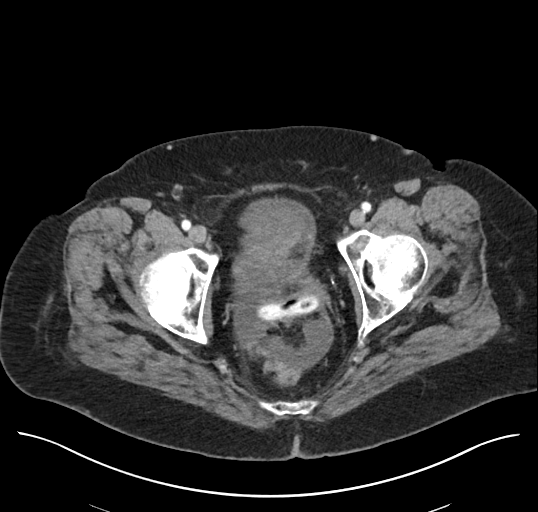
[im 31/97  soft-tissue]
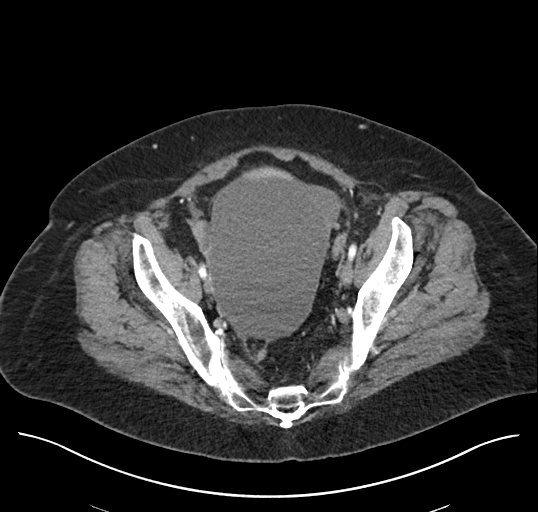
[im 35/97  soft-tissue]
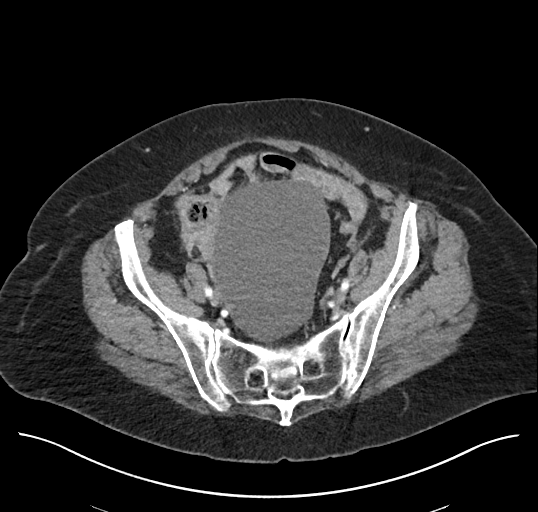
[im 44/97  soft-tissue]
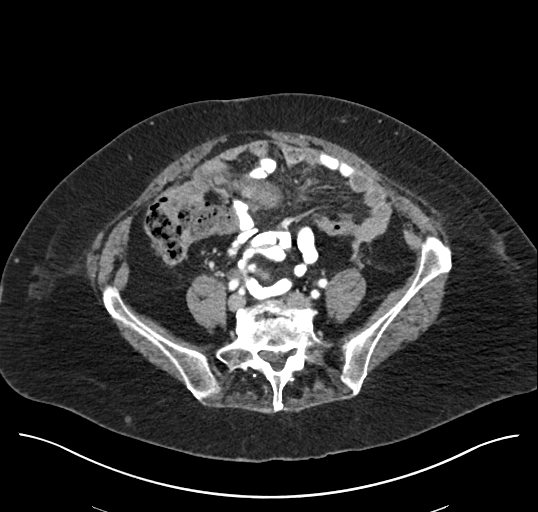
[im 53/97  soft-tissue]
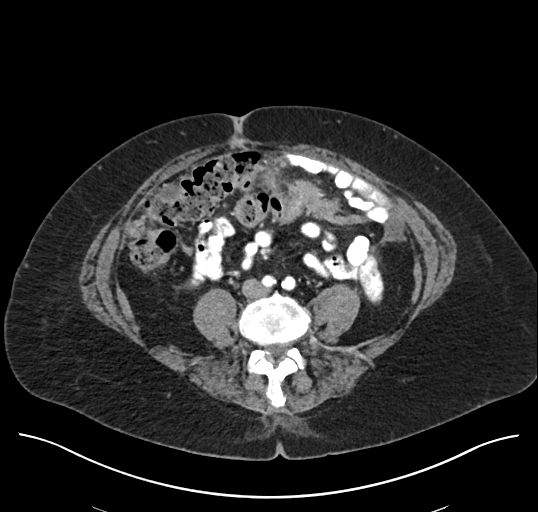
[im 62/97  soft-tissue]
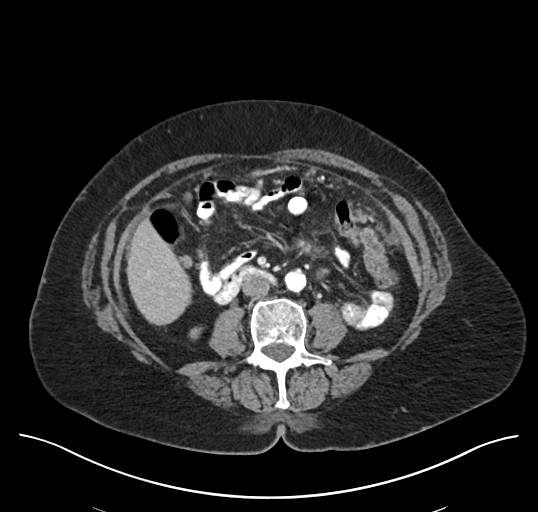
[im 66/97  soft-tissue]
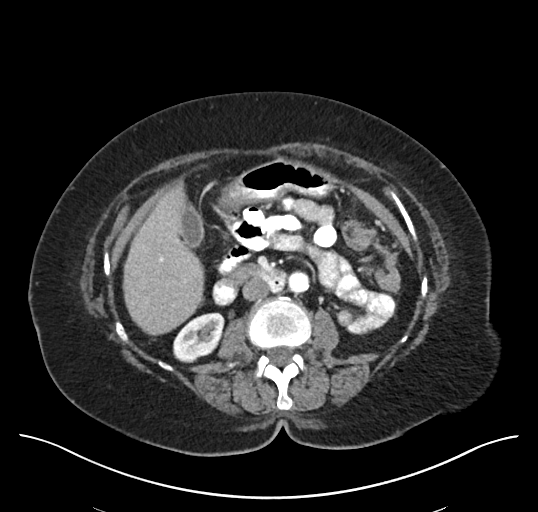
[im 66/97  bone]
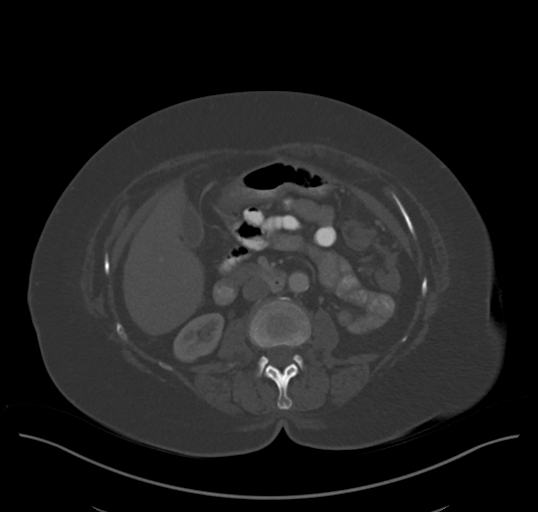
[im 75/97  soft-tissue]
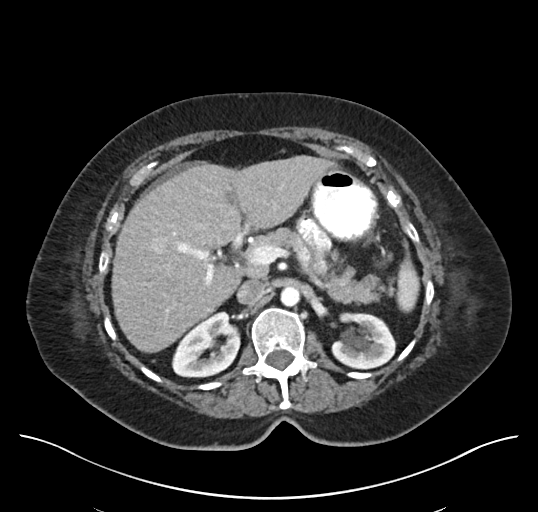
[im 79/97  lung]
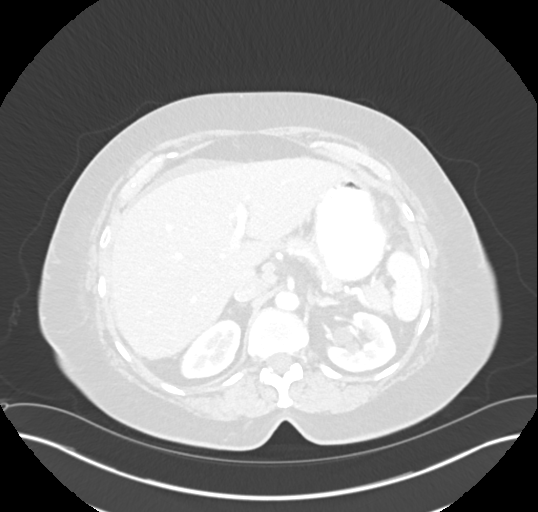
[im 83/97  soft-tissue]
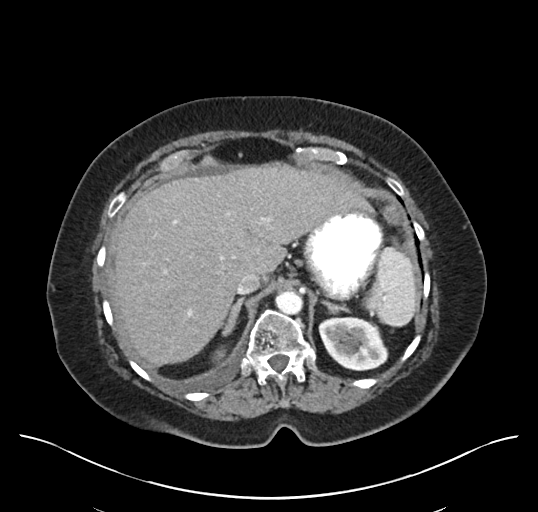
[im 83/97  lung]
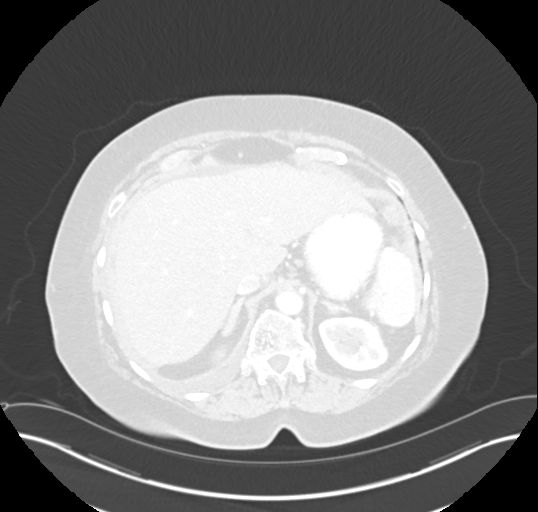
[im 88/97  lung]
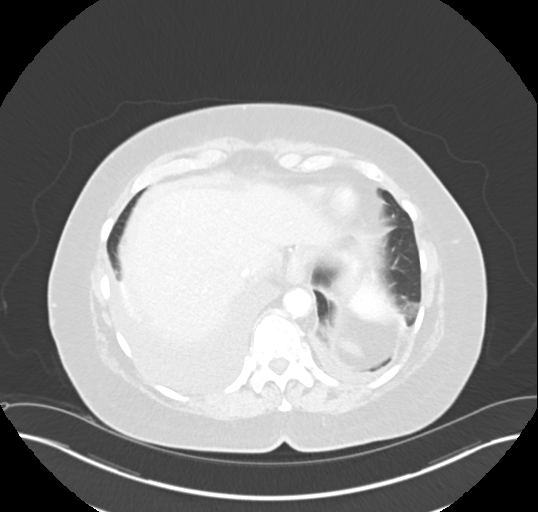
[im 92/97  soft-tissue]
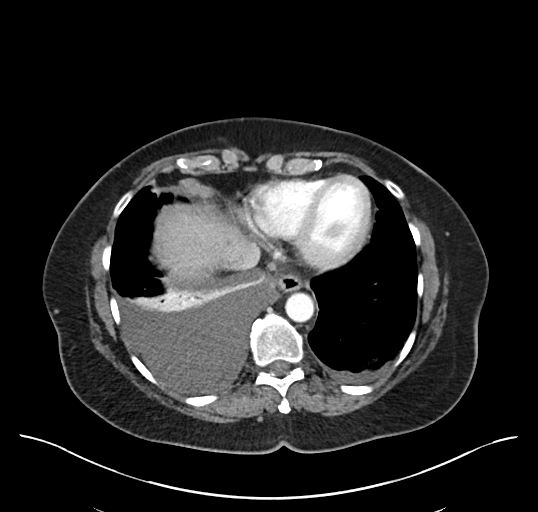
[im 92/97  lung]
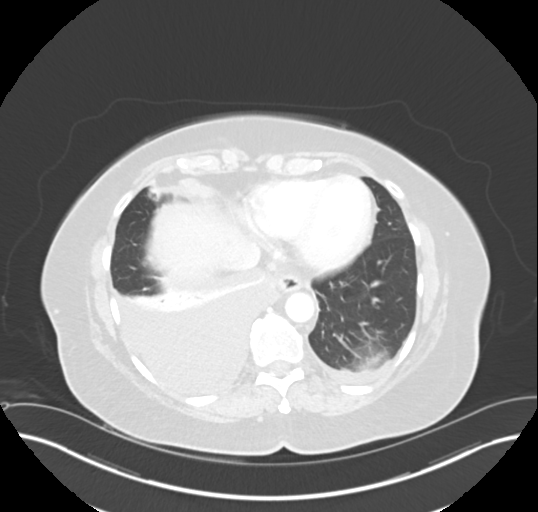

[14 of 32 positions shown; findings below may reference images not displayed]

FINDINGS: Lower Chest: Moderate right pleural effusion and tiny left pleural
effusion. Mild right lower lobe compressive atelectasis. Mild
lymphadenopathy in right cardiophrenic angle measuring up to 10 mm
spurs 6

Hepatobiliary: No hepatic masses identified. Gallbladder is
unremarkable.

Pancreas:  No mass or inflammatory changes.

Spleen: Within normal limits in size and appearance.

Adrenals/Urinary Tract: No masses identified. No evidence of
hydronephrosis.

Stomach/Bowel: No evidence of obstruction, inflammatory process or
abnormal fluid collections.

Vascular/Lymphatic: Mild lymphadenopathy in the porta hepatis, with
largest lymph node measuring 11 mm. No other pathologically enlarged
lymph nodes identified.

Reproductive: Prior hysterectomy. Pessary noted in vagina. A cystic
lesion with mild soft tissue thickening along the right lateral wall
and a thin internal septation is seen in the central pelvis is,
which measures 13.4 x 9.8 cm. Since mild ascites is seen in the
pelvic cul-de-sac and perihepatic space is. This is is soft tissue
caking is seen throughout the omentum, and multiple other tiny
peritoneal soft tissue nodules are seen, consistent with peritoneal
carcinomatosis.

Other:  None.

Musculoskeletal:  No suspicious bone lesions identified.
IMPRESSION: 1. 13 cm complex cystic lesion in the central pelvis, highly
suspicious for ovarian cystadenocarcinoma.
2. Diffuse peritoneal carcinomatosis with mild ascites.
3. Mild lymphadenopathy in porta hepatis and right cardiophrenic
angle, suspicious for metastatic disease.
4. Moderate right and tiny left pleural effusions.

These results will be called to the ordering clinician or
representative by the Radiologist Assistant, and communication
documented in the PACS or zVision Dashboard.

## 2021-04-25 MED FILL — Dexamethasone Sodium Phosphate Inj 100 MG/10ML: INTRAMUSCULAR | Qty: 1 | Status: AC

## 2021-04-25 MED FILL — Fosaprepitant Dimeglumine For IV Infusion 150 MG (Base Eq): INTRAVENOUS | Qty: 5 | Status: AC

## 2021-04-28 ENCOUNTER — Other Ambulatory Visit: Payer: Self-pay

## 2021-04-28 ENCOUNTER — Inpatient Hospital Stay (HOSPITAL_BASED_OUTPATIENT_CLINIC_OR_DEPARTMENT_OTHER): Payer: Medicare Other | Admitting: Hematology and Oncology

## 2021-04-28 ENCOUNTER — Inpatient Hospital Stay: Payer: Medicare Other

## 2021-04-28 ENCOUNTER — Other Ambulatory Visit: Payer: Self-pay | Admitting: Hematology and Oncology

## 2021-04-28 ENCOUNTER — Encounter: Payer: Self-pay | Admitting: Hematology and Oncology

## 2021-04-28 ENCOUNTER — Inpatient Hospital Stay: Payer: Medicare Other | Attending: Gynecologic Oncology

## 2021-04-28 DIAGNOSIS — C561 Malignant neoplasm of right ovary: Secondary | ICD-10-CM | POA: Diagnosis not present

## 2021-04-28 DIAGNOSIS — I2699 Other pulmonary embolism without acute cor pulmonale: Secondary | ICD-10-CM | POA: Diagnosis not present

## 2021-04-28 DIAGNOSIS — N183 Chronic kidney disease, stage 3 unspecified: Secondary | ICD-10-CM | POA: Diagnosis not present

## 2021-04-28 DIAGNOSIS — Z7901 Long term (current) use of anticoagulants: Secondary | ICD-10-CM | POA: Diagnosis not present

## 2021-04-28 DIAGNOSIS — C786 Secondary malignant neoplasm of retroperitoneum and peritoneum: Secondary | ICD-10-CM | POA: Insufficient documentation

## 2021-04-28 DIAGNOSIS — Z79899 Other long term (current) drug therapy: Secondary | ICD-10-CM | POA: Diagnosis not present

## 2021-04-28 DIAGNOSIS — Z7189 Other specified counseling: Secondary | ICD-10-CM

## 2021-04-28 DIAGNOSIS — Z5111 Encounter for antineoplastic chemotherapy: Secondary | ICD-10-CM | POA: Insufficient documentation

## 2021-04-28 DIAGNOSIS — D61818 Other pancytopenia: Secondary | ICD-10-CM | POA: Insufficient documentation

## 2021-04-28 DIAGNOSIS — Z90721 Acquired absence of ovaries, unilateral: Secondary | ICD-10-CM | POA: Diagnosis not present

## 2021-04-28 DIAGNOSIS — Z9221 Personal history of antineoplastic chemotherapy: Secondary | ICD-10-CM | POA: Insufficient documentation

## 2021-04-28 LAB — CBC WITH DIFFERENTIAL (CANCER CENTER ONLY)
Abs Immature Granulocytes: 0.01 10*3/uL (ref 0.00–0.07)
Basophils Absolute: 0 10*3/uL (ref 0.0–0.1)
Basophils Relative: 0 %
Eosinophils Absolute: 0 10*3/uL (ref 0.0–0.5)
Eosinophils Relative: 1 %
HCT: 31.9 % — ABNORMAL LOW (ref 36.0–46.0)
Hemoglobin: 10.4 g/dL — ABNORMAL LOW (ref 12.0–15.0)
Immature Granulocytes: 0 %
Lymphocytes Relative: 40 %
Lymphs Abs: 1.3 10*3/uL (ref 0.7–4.0)
MCH: 34.1 pg — ABNORMAL HIGH (ref 26.0–34.0)
MCHC: 32.6 g/dL (ref 30.0–36.0)
MCV: 104.6 fL — ABNORMAL HIGH (ref 80.0–100.0)
Monocytes Absolute: 0.5 10*3/uL (ref 0.1–1.0)
Monocytes Relative: 15 %
Neutro Abs: 1.4 10*3/uL — ABNORMAL LOW (ref 1.7–7.7)
Neutrophils Relative %: 44 %
Platelet Count: 257 10*3/uL (ref 150–400)
RBC: 3.05 MIL/uL — ABNORMAL LOW (ref 3.87–5.11)
RDW: 17.3 % — ABNORMAL HIGH (ref 11.5–15.5)
WBC Count: 3.3 10*3/uL — ABNORMAL LOW (ref 4.0–10.5)
nRBC: 0 % (ref 0.0–0.2)

## 2021-04-28 LAB — CMP (CANCER CENTER ONLY)
ALT: 19 U/L (ref 0–44)
AST: 22 U/L (ref 15–41)
Albumin: 3.5 g/dL (ref 3.5–5.0)
Alkaline Phosphatase: 94 U/L (ref 38–126)
Anion gap: 7 (ref 5–15)
BUN: 18 mg/dL (ref 8–23)
CO2: 27 mmol/L (ref 22–32)
Calcium: 9.3 mg/dL (ref 8.9–10.3)
Chloride: 104 mmol/L (ref 98–111)
Creatinine: 0.96 mg/dL (ref 0.44–1.00)
GFR, Estimated: >60 mL/min (ref >60)
Glucose, Bld: 143 mg/dL — ABNORMAL HIGH (ref 70–99)
Potassium: 4 mmol/L (ref 3.5–5.1)
Sodium: 138 mmol/L (ref 135–145)
Total Bilirubin: 0.3 mg/dL (ref 0.3–1.2)
Total Protein: 6.9 g/dL (ref 6.5–8.1)

## 2021-04-28 MED ORDER — SODIUM CHLORIDE 0.9% FLUSH
10.0000 mL | INTRAVENOUS | Status: DC | PRN
  Administered 2021-04-28: 10 mL

## 2021-04-28 MED ORDER — PALONOSETRON HCL INJECTION 0.25 MG/5ML
0.2500 mg | Freq: Once | INTRAVENOUS | Status: AC
  Administered 2021-04-28: 0.25 mg via INTRAVENOUS
  Filled 2021-04-28: qty 5

## 2021-04-28 MED ORDER — HEPARIN SOD (PORK) LOCK FLUSH 100 UNIT/ML IV SOLN
500.0000 [IU] | Freq: Once | INTRAVENOUS | Status: AC | PRN
  Administered 2021-04-28: 500 [IU]

## 2021-04-28 MED ORDER — DEXTROSE 5 % IV SOLN: Freq: Once | INTRAVENOUS | Status: AC

## 2021-04-28 MED ORDER — DOXORUBICIN HCL LIPOSOMAL CHEMO INJECTION 2 MG/ML
60.0000 mg | Freq: Once | INTRAVENOUS | Status: AC
  Administered 2021-04-28: 60 mg via INTRAVENOUS
  Filled 2021-04-28: qty 30

## 2021-04-28 MED ORDER — SODIUM CHLORIDE 0.9 % IV SOLN
150.0000 mg | Freq: Once | INTRAVENOUS | Status: AC
  Administered 2021-04-28: 150 mg via INTRAVENOUS
  Filled 2021-04-28: qty 150

## 2021-04-28 MED ORDER — SODIUM CHLORIDE 0.9 % IV SOLN
392.0000 mg | Freq: Once | INTRAVENOUS | Status: AC
  Administered 2021-04-28: 390 mg via INTRAVENOUS
  Filled 2021-04-28: qty 39

## 2021-04-28 MED ORDER — SODIUM CHLORIDE 0.9% FLUSH
10.0000 mL | Freq: Once | INTRAVENOUS | Status: AC
  Administered 2021-04-28: 10 mL

## 2021-04-28 MED ORDER — SODIUM CHLORIDE 0.9 % IV SOLN
10.0000 mg | Freq: Once | INTRAVENOUS | Status: AC
  Administered 2021-04-28: 10 mg via INTRAVENOUS
  Filled 2021-04-28: qty 10

## 2021-04-28 NOTE — Progress Notes (Signed)
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[facility]) So far, she tolerated cycle 1 of treatment well except for mild fatigue She has rare occasional hemorrhoidal bleeding but self-limiting Overall, she felt that she tolerated treatment well without major side effects We will proceed with treatment as scheduled I recommend minimum 3 cycles of treatment before repeating imaging study, due next in December  Pancytopenia, acquired [facility]) She had mild intermittent pancytopenia due to treatment We will observe and proceed with treatment without delay  PE (pulmonary thromboembolism) ([place]) She has minor mild intermittent hematochezia is self-limiting She will continue her anticoagulation therapy  No orders of the defined types were placed in this encounter.   All questions were answered. The patient knows to call the clinic with any problems, questions or concerns. The total time spent in the appointment was 20 minutes encounter with patients including review of chart and various tests results, discussions about plan of care and coordination of care plan   [name], MD 04/28/2021 10:05 AM  INTERVAL HISTORY: Please see below for problem oriented charting. she returns for treatment follow-up to be seen prior to cycle 2 of carboplatin and Doxil for recurrent ovarian cancer She tolerated last cycle of treatment well She had minor intermittent hematochezia from anticoagulation therapy but otherwise doing well Denies significant nausea or changes in bowel habits  REVIEW OF SYSTEMS:   Constitutional: Denies fevers, chills or abnormal weight loss Eyes: Denies blurriness of vision Ears, nose, mouth, throat, and face: Denies mucositis or sore throat Respiratory: Denies cough, dyspnea or  wheezes Cardiovascular: Denies palpitation, chest discomfort or lower extremity swelling Gastrointestinal:  Denies nausea, heartburn or change in bowel habits Skin: Denies abnormal skin rashes Lymphatics: Denies new lymphadenopathy or easy bruising Neurological:Denies numbness, tingling or new weaknesses Behavioral/Psych: Mood is stable, no new changes  All other systems were reviewed with the patient and are negative.  I have reviewed the past medical history, past surgical history, social history and family history with the patient and they are unchanged from previous note.  ALLERGIES:  has No Known Allergies.  MEDICATIONS:  Current Outpatient Medications  Medication Sig Dispense Refill   acetaminophen (TYLENOL) 325 MG tablet Take 650 mg by mouth every 6 (six) hours as needed for moderate pain or headache.      amoxicillin (AMOXIL) 500 MG capsule Take 500 mg by mouth in the morning and at bedtime.     apixaban (ELIQUIS) 5 MG TABS tablet Take 1 tablet (5 mg total) by mouth 2 (two) times daily. --Start around 08/14/20 after you complete your initial starter pack 60 tablet 5   atenolol (TENORMIN) 25 MG tablet TAKE ONE (1) TABLET BY MOUTH EVERY DAY 30 tablet 1   cholecalciferol (VITAMIN D3) 25 MCG (1000 UNIT) tablet Take 1,000 Units by mouth daily.     levothyroxine (SYNTHROID) 75 MCG tablet Take 1 tablet (75 mcg total) by mouth daily before breakfast. 30 tablet 3   lidocaine-prilocaine (EMLA) cream Apply to affected area once (Patient taking differently: Apply 1 application topically daily as needed (port access).) 30 g 3   loratadine (CLARITIN) 10 MG tablet Take 10 mg by mouth daily.     Melatonin 3 MG TABS Take by mouth.     ondansetron (ZOFRAN) 8 MG tablet Take 8 mg by mouth every 8 (  eight) hours as needed.     ondansetron (ZOFRAN) 8 MG tablet Take 1 tablet (8 mg total) by mouth 2 (two) times daily as needed (Nausea or vomiting). 30 tablet 1   pantoprazole (PROTONIX) 40 MG tablet Take 1  tablet (40 mg total) by mouth daily. 30 tablet 4   prochlorperazine (COMPAZINE) 10 MG tablet Take 10 mg by mouth every 6 (six) hours as needed.     prochlorperazine (COMPAZINE) 10 MG tablet Take 1 tablet (10 mg total) by mouth every 6 (six) hours as needed (Nausea or vomiting). 30 tablet 1   senna-docusate (SENOKOT-S) 8.6-50 MG tablet Take 2 tablets by mouth at bedtime. 60 tablet 3   vitamin B-12 1000 MCG tablet Take 1 tablet (1,000 mcg total) by mouth daily. 30 tablet 3   No current facility-administered medications for this visit.   Facility-Administered Medications Ordered in Other Visits  Medication Dose Route Frequency Provider Last Rate Last Admin   CARBOplatin (PARAPLATIN) 390 mg in sodium chloride 0.9 % 250 mL chemo infusion  390 mg Intravenous Once Alvy Bimler, Destyn Schuyler, MD       dexamethasone (DECADRON) 10 mg in sodium chloride 0.9 % 50 mL IVPB  10 mg Intravenous Once Alvy Bimler, Margarethe Virgen, MD 204 mL/hr at 04/28/21 1004 10 mg at 04/28/21 1004   DOXOrubicin HCL LIPOSOMAL (DOXIL) 60 mg in dextrose 5 % 250 mL chemo infusion  60 mg Intravenous Once Alvy Bimler, Brandie Lopes, MD       fosaprepitant (EMEND) 150 mg in sodium chloride 0.9 % 145 mL IVPB  150 mg Intravenous Once Alvy Bimler, Olney Monier, MD 450 mL/hr at 04/28/21 1002 150 mg at 04/28/21 1002   heparin lock flush 100 unit/mL  500 Units Intracatheter Once PRN Alvy Bimler, Anajah Sterbenz, MD       sodium chloride flush (NS) 0.9 % injection 10 mL  10 mL Intracatheter PRN Alvy Bimler, Jagar Lua, MD        SUMMARY OF ONCOLOGIC HISTORY: Oncology History Overview Note  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  She has bloating and early satiety. Shortness of breath with walking. She denied bleeding. She reported constipation with pain with defecation and narrowed stools   11/29/2018 Imaging   1. 13 cm complex cystic lesion in the central pelvis, highly  suspicious for ovarian cystadenocarcinoma. 2. Diffuse peritoneal carcinomatosis with mild ascites. 3. Mild lymphadenopathy in porta hepatis and right cardiophrenic angle, suspicious for metastatic disease. 4. Moderate right and tiny left pleural effusions   12/05/2018 Tumor Marker   Patient's tumor was tested for the following markers: CA-125 Results of the tumor marker test revealed 1015.   12/06/2018 Cancer Staging   Staging form: Ovary, Fallopian Tube, and Primary Peritoneal Carcinoma, AJCC 8th Edition - Clinical: FIGO Stage IVA, calculated as Stage IV (cT3c, cN1, cM1) - Signed by [name], MD on 12/06/2018    12/09/2018 Pathology Results   PLEURAL FLUID, RIGHT (SPECIMEN 1 OF 1 COLLECTED 12/09/18): - MALIGNANT CELLS CONSISTENT WITH METASTATIC ADENOCARCINOMA - SEE COMMENT Comment The neoplastic cells are positive for cytokeratin 7 and Pax-8 but negative for cytokeratin 20, TTF-1, CDX-2 and Gata-3. Overall, the phenotype is consistent with the clinical impression of gynecologic primary.    12/15/2018 Procedure   Placement of single lumen port a cath via right internal jugular vein. The catheter tip lies at the cavo-atrial junction. A power injectable port a cath was placed  and is ready for immediate use   12/16/2018 - 08/24/2019 Chemotherapy   The patient had carboplatin and taxol for chemotherapy treatment.  She had 7 cycles given neoadjuvant prior to surgery and 3 more cycles after surgery, for a total of 10 cycles of treatment    01/06/2019 Tumor Marker   Patient's tumor was tested for the following markers: CA-125 Results of the tumor marker test revealed 948   02/02/2019 Imaging   1. Massive pulmonary embolism, as discussed above. Given the mildly elevated RV to LV ratio of 0.95, this is associated with increased risk of morbidity and mortality. 2. Today's study demonstrates a mixed response to therapy. Specifically, while there has been regression of the bulky intraperitoneal metastatic  disease and regression of previously noted pleural effusions, the large cystic mass in the central pelvis has increased in size compared to the prior study. 3. New right mild hydroureteronephrosis related to extrinsic compression on the distal third of the right ureter by the patient's large pelvic mass. 4. Scattered small pulmonary nodules (predominantly pleural based) appear stable compared to prior examinations. These are nonspecific but warrant continued attention on follow-up studies. 5. Aortic atherosclerosis, in addition to three-vessel coronary artery disease. Assessment for potential risk factor modification, dietary therapy or pharmacologic therapy may be warranted, if clinically indicated.   02/02/2019 - 02/04/2019 Hospital Admission   She was admitted the hospital due to DVT and PE   02/03/2019 Imaging   Bilateral venous Doppler US Right: Findings consistent with acute deep vein thrombosis involving the right femoral vein, right popliteal vein, right peroneal veins, right soleal veins, and right gastrocnemius veins. No cystic structure found in the popliteal fossa. Left: There is no evidence of deep vein thrombosis in the lower extremity. No cystic structure found in the popliteal fossa   02/17/2019 Tumor Marker   Patient's tumor was tested for the following markers: CA-125 Results of the tumor marker test revealed 127   03/10/2019 Tumor Marker   Patient's tumor was tested for the following markers: CA-125. Results of the tumor marker test revealed 87.4   03/14/2019 - 03/16/2019 Hospital Admission   She was admitted to the hospital recently for weakness   03/31/2019 Tumor Marker   Patient's tumor was tested for the following markers: CA-125 Results of the tumor marker test revealed 59.2.   04/28/2019 Tumor Marker   Patient's tumor was tested for the following markers: CA-125 Results of the tumor marker test revealed 56.8   05/05/2019 Imaging   1. Interval decrease in size of the  large cystic mass in the central pelvis. Mesenteric and omental soft tissue disease shows no substantial interval change. 2. The mild right hydroureteronephrosis seen previously has resolved in the interval. 3. Small residual nonobstructive thrombus identified in the inter lobar pulmonary artery common here into the lateral wall compatible with chronicity. 4. 14 mm subtle enhancing lesion in the anterior right liver is stable. This may be vascular malformation. Attention on follow-up recommended. 5. Stable appearance of the multiple small bilateral pulmonary nodules. Continued attention on follow-up recommended. 6.  Aortic Atherosclerois (ICD10-170.0)     06/06/2019 Pathology Results   A. OMENTUM, RESECTION: - Metastatic carcinoma. B. RIGHT FALLOPIAN TUBE AND OVARY, SALPINGOOOPHORECTOMY: - High-grade serous carcinoma, spanning 9 cm. - No surface involvement identified. - Fallopian tube involved by carcinoma. - See oncology table. C. PERITONEAL NODULE, EXCISION: - Metastatic carcinoma. ONCOLOGY TABLE: OVARY or FALLOPIAN TUBE or PRIMARY PERITONEUM: Procedure: Right salpingo-oophorectomy, omental resection, and peritoneal biopsy. Specimen Integrity:  Intact Tumor Site: Right ovary Ovarian Surface Involvement (required only if applicable): Not identified Tumor Size: 9 cm Histologic Type: High-grade serous carcinoma Histologic Grade: High-grade Implants (required for advanced stage serous/seromucinous borderline tumors only): Omentum, peritoneum. Other Tissue/ Organ Involvement: Right fallopian tube Largest Extrapelvic Peritoneal Focus (required only if applicable): 7.3 cm Peritoneal/Ascitic Fluid: Positive pleural fluid pre neoadjuvant therapy. Treatment Effect (required only for high-grade serous carcinomas): Probable treatment effect present. Regional Lymph Nodes: No lymph nodes submitted or found Pathologic Stage Classification (pTNM, AJCC 8th Edition): ypT3c, ypNX Representative Tumor  Block: B2   06/06/2019 Surgery   Preoperative Diagnosis: stage IV ovarian cancer, s/p neoadjuvant chemotherapy, history of recent PE.  Procedure(s) Performed: Exploratory laparotomy with right salpingo-oophorectomy, omentectomy radical tumor debulking for ovarian cancer .   Surgeon: Thereasa Solo, MD.    Operative Findings:  Omental cake adherent to anterior abdominal wall and hepatic flexure. 10cm right tube and ovary. Surgically absent uterus and left tube and ovary. Granular nodularity across right diaphragm.    This represented an optimal cytoreduction (R0) with no gross visible disease remaining.    07/06/2019 Tumor Marker   Patient's tumor was tested for the following markers:CA-125 Results of the tumor marker test revealed 16.4   08/10/2019 Genetic Testing   Negative genetic testing. No pathogenic variants identified. VUS in BRCA2 called [id number] identified on the [employer] CancerNext+RNAinsight panel. The report date is 08/10/2019.  The CancerNext+RNAinsight gene panel offered by [employer] [facility] includes sequencing and rearrangement analysis for the following 36 genes: APC*, ATM*, AXIN2, BARD1, BMPR1A, BRCA1*, BRCA2*, BRIP1*, CDH1*, CDK4, CDKN2A, CHEK2*, DICER1, MLH1*, MSH2*, MSH3, MSH6*, MUTYH*, NBN, NF1*, NTHL1, PALB2*, PMS2*, PTEN*, RAD51C*, RAD51D*, RECQL, SMAD4, SMARCA4, STK11 and TP53* (sequencing and deletion/duplication); HOXB13, POLD1 and POLE (sequencing only); EPCAM and GREM1 (deletion/duplication only). DNA and RNA analyses performed for * genes.   HRD (somatic) testing was initially ordered and was not completed due to insufficient amount of tumor sample.    08/24/2019 Tumor Marker   Patient's tumor was tested for the following markers: CA-125 Results of the tumor marker test revealed 12.2   09/20/2019 Imaging   1. Interval debulking, bilateral salpingo oophorectomy and omentectomy with resection of the dominant pelvic cystic lesion and the bandlike soft tissue seen previously in  the right mesentery/omentum. Today's study is status shows new postoperative baseline for follow-up. 2. 6 mm gastrohepatic ligament nodule seen on the previous study is stable. Continued attention on follow-up recommended. 3. No new or progressive findings in the chest, abdomen, or pelvis to suggest disease progression. 4. Stable 1.4 cm focus of homogeneous enhancement in the anterior right liver. Stable since at least 11/29/2018 and likely benign. Continued attention on follow-up recommended. 5. Aortic Atherosclerosis (ICD10-I70.0).   09/20/2019 Tumor Marker   Patient's tumor was tested for the following markers: CA-125 Results of the tumor marker test revealed 11.9   12/26/2019 Tumor Marker   Patient's tumor was tested for the following markers: CA-125 Results of the tumor marker test revealed 16.5   03/25/2020 Imaging   1. Interval progression of peritoneal disease which predominantly involves the serosal surface of the proximal and distal transverse colon, and descending colon. 2. New bilateral inguinal adenopathy. 3. No ascites. 4. Aortic atherosclerosis.   03/25/2020 Tumor Marker   Patient's tumor was tested for the following markers: CA-125 Results of the tumor marker test revealed 249   03/28/2020 Echocardiogram    1. Borderline LV Strain; A2C view represents the most accurate acquisition.. Left ventricular ejection fraction,  by estimation, is 60 to 65%. Left ventricular ejection fraction by PLAX is 63 %. The left ventricle has normal function. The left ventricle demonstrates regional wall motion abnormalities (see scoring diagram/findings for description). Left ventricular diastolic parameters were normal.  2. Right ventricular systolic function is normal. The right ventricular size is normal.  3. Left atrial size was mildly dilated.  4. The mitral valve is normal in structure. No evidence of mitral valve regurgitation.  5. The aortic valve is grossly normal. Aortic valve  regurgitation is mild.  6. The inferior vena cava is normal in size with greater than 50% respiratory variability, suggesting right atrial pressure of 3 mmHg.   04/02/2020 Tumor Marker   Patient's tumor was tested for the following markers: CA-125 Results of the tumor marker test revealed 304   04/02/2020 - 08/22/2020 Chemotherapy   The patient had carboplatin and Doxil for chemotherapy treatment.     05/27/2020 Tumor Marker   Patient's tumor was tested for the following markers: CA-125. Results of the tumor marker test revealed 42.7   06/10/2020 Imaging   Improving peritoneal disease and serosal implants along the left colon, as above.   Improving bilateral inguinal nodes.   06/18/2020 Echocardiogram    1. Left ventricular ejection fraction, by estimation, is 60 to 65%. The left ventricle has normal function. The left ventricle has no regional wall motion abnormalities. There is mild left ventricular hypertrophy of the basal-septal segment. Left ventricular diastolic parameters were normal. The average left ventricular global longitudinal strain is -25.0 %. The global longitudinal strain is normal.  2. Right ventricular systolic function is normal. The right ventricular size is normal.  3. The mitral valve is grossly normal. Mild mitral valve regurgitation.  4. Tricuspid valve regurgitation is moderate.  5. The aortic valve is tricuspid. There is mild calcification of the aortic valve. There is mild thickening of the aortic valve. Aortic valve regurgitation is mild to moderate.  6. The inferior vena cava is normal in size with <50% respiratory variability, suggesting right atrial pressure of 8 mmHg.   06/25/2020 Tumor Marker   Patient's tumor was tested for the following markers: CA-125 Results of the tumor marker test revealed 23.9   07/15/2020 Imaging   IMPRESSION: 1. Mild amount of acute pulmonary embolism within multiple middle lobe and lower lobe branches of the right pulmonary  artery. 2.   Small amount of chronic right pulmonary embolism. 3.   Mild posterior left basilar atelectasis and/or infiltrate. 4.   Small left pleural effusion. 5. Aortic atherosclerosis.   Aortic Atherosclerosis (ICD10-I70.0).     07/15/2020 - 07/17/2020 Hospital Admission   She was admitted to a local hospital after presentation with chest pain and shortness of breath and was found to have significant pulmonary embolism She was anticoagulated and discharged   07/16/2020 Echocardiogram   1. Left ventricular ejection fraction, by estimation, is 60 to 65%. The left ventricle has normal function. The left ventricle has no regional wall motion abnormalities. There is mild left ventricular hypertrophy.  Left ventricular diastolic parameters are indeterminate.   2. Right ventricular systolic function is normal. The right ventricular size is normal. There is normal pulmonary artery systolic pressure. The estimated right ventricular systolic pressure is 95.2 mmHg.   3. The mitral valve is grossly normal. Trivial mitral valve regurgitation.   4. The aortic valve is tricuspid. Aortic valve regurgitation is mild.   5. The inferior vena cava is normal in size with greater than 50% respiratory variability,  suggesting right atrial pressure of 3 mmHg.    07/25/2020 Tumor Marker   Patient's tumor was tested for the following markers: CA-125 Results of the tumor marker test revealed 36.4   09/09/2020 Imaging   1. Mild improvement in peritoneal and serosal metastasis along the splenic flexure and descending colon. 2. Inguinal lymph nodes are slightly increased in size. Lymph nodes increased in size from 06/10/2020 and similar to CT of 03/25/2020. 3. No new peritoneal disease.  No free fluid   09/21/2020 - 10/21/2020 Chemotherapy   She started taking Niraparib, discontinued due to intolerable side effects from headache and hypertension       10/01/2020 Tumor Marker   Patient's tumor was tested for the  following markers: CA-125 Results of the tumor marker test revealed 83.4.   10/16/2020 Imaging   No acute findings in the abdomen or pelvis.   Hepatic steatosis.   Large stool burden throughout the colon.       12/03/2020 Tumor Marker   Patient's tumor was tested for the following markers: CA-125 Results of the tumor marker test revealed 146   12/20/2020 Imaging   1. Signs of peritoneal carcinomatosis. No significant interval change from previous exam. 2. No findings of solid organ metastasis or nodal metastasis within the abdomen or pelvis. Enlarged right inguinal node is mildly increased in size. Stable enlarged left inguinal node. 3. No evidence for metastatic disease to the chest. 4. Aortic atherosclerosis. Coronary artery calcifications.   12/27/2020 - 03/07/2021 Chemotherapy    Patient is on PACLITAXEL       02/07/2021 Tumor Marker   Patient's tumor was tested for the following markers: CA-125. Results of the tumor marker test revealed 395.   02/28/2021 Tumor Marker   Patient's tumor was tested for the following markers: CA-125. Results of the tumor marker test revealed 548.   03/20/2021 Imaging   1. Interval enlargement of multiple peritoneal soft tissue masses and nodules. 2. Interval enlargement of portacaval lymph nodes as well as slight interval enlargement of bilateral inguinal lymph nodes. 3. Findings are consistent with worsened nodal and peritoneal metastatic disease. 4. Status post hysterectomy.   Aortic Atherosclerosis (ICD10-I70.0).   03/27/2021 Echocardiogram   1. Left ventricular ejection fraction, by estimation, is 60 to 65%. The left ventricle has normal function. The left ventricle has no regional wall motion abnormalities. There is mild asymmetric left ventricular hypertrophy of the basal-septal segment. Left ventricular diastolic parameters were normal.  2. Right ventricular systolic function is normal. The right ventricular size is normal.  3. The mitral  valve is normal in structure. Trivial mitral valve regurgitation. No evidence of mitral stenosis.  4. The aortic valve is tricuspid. Aortic valve regurgitation is moderate. No aortic stenosis is present.  5. The inferior vena cava is normal in size with greater than 50% respiratory variability, suggesting right atrial pressure of 3 mmHg.   03/28/2021 -  Chemotherapy   Patient is on Treatment Plan : OVARIAN RECURRENT Liposomal Doxorubicin + Carboplatin q28d X 6 Cycles     03/28/2021 Tumor Marker   Patient's tumor was tested for the following markers: CA-125. Results of the tumor marker test revealed 991.     PHYSICAL EXAMINATION: ECOG PERFORMANCE STATUS: 1 - Symptomatic but completely ambulatory  Vitals:   04/28/21 0857  BP: 139/67  Pulse: 71  Resp: 17  Temp: 98 F (36.7 C)  SpO2: 99%   Filed Weights   04/28/21 0857  Weight: 219 lb 12.8 oz (99.7 kg)  GENERAL:alert, no distress and comfortable SKIN: skin color, texture, turgor are normal, no rashes or significant lesions EYES: normal, Conjunctiva are pink and non-injected, sclera clear OROPHARYNX:no exudate, no erythema and lips, buccal mucosa, and tongue normal  NECK: supple, thyroid normal size, non-tender, without nodularity LYMPH:  no palpable lymphadenopathy in the cervical, axillary or inguinal LUNGS: clear to auscultation and percussion with normal breathing effort HEART: regular rate & rhythm and no murmurs and no lower extremity edema ABDOMEN:abdomen soft, non-tender and normal bowel sounds Musculoskeletal:no cyanosis of digits and no clubbing  NEURO: alert & oriented x 3 with fluent speech, no focal motor/sensory deficits  LABORATORY DATA:  I have reviewed the data as listed    Component Value Date/Time   NA 138 04/28/2021 0839   K 4.0 04/28/2021 0839   CL 104 04/28/2021 0839   CO2 27 04/28/2021 0839   GLUCOSE 143 (H) 04/28/2021 0839   BUN 18 04/28/2021 0839   CREATININE 0.96 04/28/2021 0839   CALCIUM 9.3  04/28/2021 0839   PROT 6.9 04/28/2021 0839   ALBUMIN 3.5 04/28/2021 0839   AST 22 04/28/2021 0839   ALT 19 04/28/2021 0839   ALKPHOS 94 04/28/2021 0839   BILITOT 0.3 04/28/2021 0839   GFRNONAA >60 04/28/2021 0839   GFRAA >60 03/25/2020 1144    No results found for: SPEP, UPEP  Lab Results  Component Value Date   WBC 3.3 (L) 04/28/2021   NEUTROABS 1.4 (L) 04/28/2021   HGB 10.4 (L) 04/28/2021   HCT 31.9 (L) 04/28/2021   MCV 104.6 (H) 04/28/2021   PLT 257 04/28/2021      Chemistry      Component Value Date/Time   NA 138 04/28/2021 0839   K 4.0 04/28/2021 0839   CL 104 04/28/2021 0839   CO2 27 04/28/2021 0839   BUN 18 04/28/2021 0839   CREATININE 0.96 04/28/2021 0839      Component Value Date/Time   CALCIUM 9.3 04/28/2021 0839   ALKPHOS 94 04/28/2021 0839   AST 22 04/28/2021 0839   ALT 19 04/28/2021 0839   BILITOT 0.3 04/28/2021 0839

## 2021-04-28 NOTE — Patient Instructions (Signed)
[facility]  Discharge Instructions: Thank you for choosing [facility] to provide your oncology and hematology care.   If you have a lab appointment with the Cancer Center, please go directly to the Cancer Center and check in at the registration area.   Wear comfortable clothing and clothing appropriate for easy access to any Portacath or PICC line.   We strive to give you quality time with your provider. You may need to reschedule your appointment if you arrive late (15 or more minutes).  Arriving late affects you and other patients whose appointments are after yours.  Also, if you miss three or more appointments without notifying the office, you may be dismissed from the clinic at the provider's discretion.      For prescription refill requests, have your pharmacy contact our office and allow 72 hours for refills to be completed.    Today you received the following chemotherapy and/or immunotherapy agents:  Doxil & Carboplatin    To help prevent nausea and vomiting after your treatment, we encourage you to take your nausea medication as directed.  BELOW ARE SYMPTOMS THAT SHOULD BE REPORTED IMMEDIATELY: . *FEVER GREATER THAN 100.4 F (38 C) OR HIGHER . *CHILLS OR SWEATING . *NAUSEA AND VOMITING THAT IS NOT CONTROLLED WITH YOUR NAUSEA MEDICATION . *UNUSUAL SHORTNESS OF BREATH . *UNUSUAL BRUISING OR BLEEDING . *URINARY PROBLEMS (pain or burning when urinating, or frequent urination) . *BOWEL PROBLEMS (unusual diarrhea, constipation, pain near the anus) . TENDERNESS IN MOUTH AND THROAT WITH OR WITHOUT PRESENCE OF ULCERS (sore throat, sores in mouth, or a toothache) . UNUSUAL RASH, SWELLING OR PAIN  . UNUSUAL VAGINAL DISCHARGE OR ITCHING   Items with * indicate a potential emergency and should be followed up as soon as possible or go to the Emergency Department if any problems should occur.  Please show the CHEMOTHERAPY ALERT CARD or  IMMUNOTHERAPY ALERT CARD at check-in to the Emergency Department and triage nurse.  Should you have questions after your visit or need to cancel or reschedule your appointment, please contact  [facility]  Dept: [phone]  and follow the prompts.  Office hours are 8:00 a.m. to 4:30 p.m. Monday - Friday. Please note that voicemails left after 4:00 p.m. may not be returned until the following business day.  We are closed weekends and major holidays. You have access to a nurse at all times for urgent questions. Please call the main number to the clinic Dept: [phone] and follow the prompts.   For any non-urgent questions, you may also contact your provider using MyChart. We now offer e-Visits for anyone 18 and older to request care online for non-urgent symptoms. For details visit [email or web address].   Also download the MyChart app! Go to the app store, search "MyChart", open the app, select , and log in with your MyChart username and password.  Due to Covid, a mask is required upon entering the hospital/clinic. If you do not have a mask, one will be given to you upon arrival. For doctor visits, patients may have 1 support person aged 18 or older with them. For treatment visits, patients cannot have anyone with them due to current Covid guidelines and our immunocompromised population.   

## 2021-04-28 NOTE — Assessment & Plan Note (Signed)
She had mild intermittent pancytopenia due to treatment We will observe and proceed with treatment without delay

## 2021-04-28 NOTE — Assessment & Plan Note (Signed)
She has minor mild intermittent hematochezia is self-limiting She will continue her anticoagulation therapy

## 2021-04-28 NOTE — Assessment & Plan Note (Addendum)
So far, she tolerated cycle 1 of treatment well except for mild fatigue She has rare occasional hemorrhoidal bleeding but self-limiting Overall, she felt that she tolerated treatment well without major side effects We will proceed with treatment as scheduled I recommend minimum 3 cycles of treatment before repeating imaging study, due next in December

## 2021-04-29 LAB — CA 125: Cancer Antigen (CA) 125: 992 U/mL — ABNORMAL HIGH (ref 0.0–38.1)

## 2021-05-02 ENCOUNTER — Other Ambulatory Visit: Payer: Self-pay | Admitting: Hematology and Oncology

## 2021-05-19 ENCOUNTER — Other Ambulatory Visit: Payer: Self-pay | Admitting: Hematology and Oncology

## 2021-05-19 DIAGNOSIS — N816 Rectocele: Secondary | ICD-10-CM | POA: Diagnosis not present

## 2021-05-23 MED FILL — Fosaprepitant Dimeglumine For IV Infusion 150 MG (Base Eq): INTRAVENOUS | Qty: 5 | Status: AC

## 2021-05-23 MED FILL — Dexamethasone Sodium Phosphate Inj 100 MG/10ML: INTRAMUSCULAR | Qty: 1 | Status: AC

## 2021-05-26 ENCOUNTER — Inpatient Hospital Stay: Payer: Medicare Other

## 2021-05-26 ENCOUNTER — Other Ambulatory Visit: Payer: Self-pay

## 2021-05-26 ENCOUNTER — Inpatient Hospital Stay: Payer: Medicare Other | Attending: Gynecologic Oncology

## 2021-05-26 ENCOUNTER — Encounter: Payer: Self-pay | Admitting: Hematology and Oncology

## 2021-05-26 ENCOUNTER — Inpatient Hospital Stay (HOSPITAL_BASED_OUTPATIENT_CLINIC_OR_DEPARTMENT_OTHER): Payer: Medicare Other | Admitting: Hematology and Oncology

## 2021-05-26 VITALS — BP 129/58

## 2021-05-26 DIAGNOSIS — Z7901 Long term (current) use of anticoagulants: Secondary | ICD-10-CM | POA: Diagnosis not present

## 2021-05-26 DIAGNOSIS — Z9221 Personal history of antineoplastic chemotherapy: Secondary | ICD-10-CM | POA: Insufficient documentation

## 2021-05-26 DIAGNOSIS — D61818 Other pancytopenia: Secondary | ICD-10-CM

## 2021-05-26 DIAGNOSIS — Z79899 Other long term (current) drug therapy: Secondary | ICD-10-CM | POA: Insufficient documentation

## 2021-05-26 DIAGNOSIS — N183 Chronic kidney disease, stage 3 unspecified: Secondary | ICD-10-CM | POA: Insufficient documentation

## 2021-05-26 DIAGNOSIS — C786 Secondary malignant neoplasm of retroperitoneum and peritoneum: Secondary | ICD-10-CM | POA: Diagnosis not present

## 2021-05-26 DIAGNOSIS — C561 Malignant neoplasm of right ovary: Secondary | ICD-10-CM | POA: Insufficient documentation

## 2021-05-26 DIAGNOSIS — Z7189 Other specified counseling: Secondary | ICD-10-CM

## 2021-05-26 DIAGNOSIS — Z5111 Encounter for antineoplastic chemotherapy: Secondary | ICD-10-CM | POA: Insufficient documentation

## 2021-05-26 DIAGNOSIS — I2699 Other pulmonary embolism without acute cor pulmonale: Secondary | ICD-10-CM | POA: Insufficient documentation

## 2021-05-26 DIAGNOSIS — Z90721 Acquired absence of ovaries, unilateral: Secondary | ICD-10-CM | POA: Diagnosis not present

## 2021-05-26 LAB — CBC WITH DIFFERENTIAL (CANCER CENTER ONLY)
Abs Immature Granulocytes: 0 10*3/uL (ref 0.00–0.07)
Basophils Absolute: 0 10*3/uL (ref 0.0–0.1)
Basophils Relative: 0 %
Eosinophils Absolute: 0 10*3/uL (ref 0.0–0.5)
Eosinophils Relative: 0 %
HCT: 30.2 % — ABNORMAL LOW (ref 36.0–46.0)
Hemoglobin: 10.4 g/dL — ABNORMAL LOW (ref 12.0–15.0)
Immature Granulocytes: 0 %
Lymphocytes Relative: 39 %
Lymphs Abs: 1.2 10*3/uL (ref 0.7–4.0)
MCH: 35.5 pg — ABNORMAL HIGH (ref 26.0–34.0)
MCHC: 34.4 g/dL (ref 30.0–36.0)
MCV: 103.1 fL — ABNORMAL HIGH (ref 80.0–100.0)
Monocytes Absolute: 0.4 10*3/uL (ref 0.1–1.0)
Monocytes Relative: 13 %
Neutro Abs: 1.4 10*3/uL — ABNORMAL LOW (ref 1.7–7.7)
Neutrophils Relative %: 48 %
Platelet Count: 181 10*3/uL (ref 150–400)
RBC: 2.93 MIL/uL — ABNORMAL LOW (ref 3.87–5.11)
RDW: 17.8 % — ABNORMAL HIGH (ref 11.5–15.5)
WBC Count: 3 10*3/uL — ABNORMAL LOW (ref 4.0–10.5)
nRBC: 0 % (ref 0.0–0.2)

## 2021-05-26 LAB — CMP (CANCER CENTER ONLY)
ALT: 21 U/L (ref 0–44)
AST: 20 U/L (ref 15–41)
Albumin: 3.6 g/dL (ref 3.5–5.0)
Alkaline Phosphatase: 90 U/L (ref 38–126)
Anion gap: 9 (ref 5–15)
BUN: 16 mg/dL (ref 8–23)
CO2: 25 mmol/L (ref 22–32)
Calcium: 9.5 mg/dL (ref 8.9–10.3)
Chloride: 104 mmol/L (ref 98–111)
Creatinine: 0.93 mg/dL (ref 0.44–1.00)
GFR, Estimated: >60 mL/min (ref >60)
Glucose, Bld: 127 mg/dL — ABNORMAL HIGH (ref 70–99)
Potassium: 4 mmol/L (ref 3.5–5.1)
Sodium: 138 mmol/L (ref 135–145)
Total Bilirubin: 0.3 mg/dL (ref 0.3–1.2)
Total Protein: 6.9 g/dL (ref 6.5–8.1)

## 2021-05-26 MED ORDER — SODIUM CHLORIDE 0.9% FLUSH
10.0000 mL | INTRAVENOUS | Status: DC | PRN
  Administered 2021-05-26: 15:00:00 10 mL

## 2021-05-26 MED ORDER — SODIUM CHLORIDE 0.9 % IV SOLN
392.0000 mg | Freq: Once | INTRAVENOUS | Status: AC
  Administered 2021-05-26: 14:00:00 390 mg via INTRAVENOUS
  Filled 2021-05-26: qty 39

## 2021-05-26 MED ORDER — HEPARIN SOD (PORK) LOCK FLUSH 100 UNIT/ML IV SOLN
500.0000 [IU] | Freq: Once | INTRAVENOUS | Status: AC | PRN
  Administered 2021-05-26: 15:00:00 500 [IU]

## 2021-05-26 MED ORDER — DEXTROSE 5 % IV SOLN: Freq: Once | INTRAVENOUS | Status: AC

## 2021-05-26 MED ORDER — SODIUM CHLORIDE 0.9 % IV SOLN
150.0000 mg | Freq: Once | INTRAVENOUS | Status: AC
  Administered 2021-05-26: 12:00:00 150 mg via INTRAVENOUS
  Filled 2021-05-26: qty 150

## 2021-05-26 MED ORDER — SODIUM CHLORIDE 0.9 % IV SOLN
10.0000 mg | Freq: Once | INTRAVENOUS | Status: AC
  Administered 2021-05-26: 12:00:00 10 mg via INTRAVENOUS
  Filled 2021-05-26: qty 10

## 2021-05-26 MED ORDER — DOXORUBICIN HCL LIPOSOMAL CHEMO INJECTION 2 MG/ML
60.0000 mg | Freq: Once | INTRAVENOUS | Status: AC
  Administered 2021-05-26: 13:00:00 60 mg via INTRAVENOUS
  Filled 2021-05-26: qty 25

## 2021-05-26 MED ORDER — SODIUM CHLORIDE 0.9% FLUSH
10.0000 mL | Freq: Once | INTRAVENOUS | Status: AC
  Administered 2021-05-26: 10:00:00 10 mL

## 2021-05-26 MED ORDER — PALONOSETRON HCL INJECTION 0.25 MG/5ML
0.2500 mg | Freq: Once | INTRAVENOUS | Status: AC
  Administered 2021-05-26: 12:00:00 0.25 mg via INTRAVENOUS
  Filled 2021-05-26: qty 5

## 2021-05-26 NOTE — Patient Instructions (Signed)
[facility]  Discharge Instructions: Thank you for choosing [facility] to provide your oncology and hematology care.   If you have a lab appointment with the [facility], please go directly to the [facility] and check in at the registration area.   Wear comfortable clothing and clothing appropriate for easy access to any Portacath or PICC line.   We strive to give you quality time with your provider. You may need to reschedule your appointment if you arrive late (15 or more minutes).  Arriving late affects you and other patients whose appointments are after yours.  Also, if you miss three or more appointments without notifying the office, you may be dismissed from the clinic at the provider's discretion.      For prescription refill requests, have your pharmacy contact our office and allow 72 hours for refills to be completed.    Today you received the following chemotherapy and/or immunotherapy agents: Doxorubicin and Carboplatin.    To help prevent nausea and vomiting after your treatment, we encourage you to take your nausea medication as directed.  BELOW ARE SYMPTOMS THAT SHOULD BE REPORTED IMMEDIATELY: *FEVER GREATER THAN 100.4 F (38 C) OR HIGHER *CHILLS OR SWEATING *NAUSEA AND VOMITING THAT IS NOT CONTROLLED WITH YOUR NAUSEA MEDICATION *UNUSUAL SHORTNESS OF BREATH *UNUSUAL BRUISING OR BLEEDING *URINARY PROBLEMS (pain or burning when urinating, or frequent urination) *BOWEL PROBLEMS (unusual diarrhea, constipation, pain near the anus) TENDERNESS IN MOUTH AND THROAT WITH OR WITHOUT PRESENCE OF ULCERS (sore throat, sores in mouth, or a toothache) UNUSUAL RASH, SWELLING OR PAIN  UNUSUAL VAGINAL DISCHARGE OR ITCHING   Items with * indicate a potential emergency and should be followed up as soon as possible or go to the Emergency Department if any problems should occur.  Please show the CHEMOTHERAPY ALERT CARD or IMMUNOTHERAPY ALERT  CARD at check-in to the Emergency Department and triage nurse.  Should you have questions after your visit or need to cancel or reschedule your appointment, please contact [facility]  Dept: [phone]  and follow the prompts.  Office hours are 8:00 a.m. to 4:30 p.m. Monday - Friday. Please note that voicemails left after 4:00 p.m. may not be returned until the following business day.  We are closed weekends and major holidays. You have access to a nurse at all times for urgent questions. Please call the main number to the clinic Dept: [phone] and follow the prompts.   For any non-urgent questions, you may also contact your provider using MyChart. We now offer e-Visits for anyone 18 and older to request care online for non-urgent symptoms. For details visit [email or web address].   Also download the MyChart app! Go to the app store, search "MyChart", open the app, select [facility], and log in with your MyChart username and password.  Due to Covid, a mask is required upon entering the hospital/clinic. If you do not have a mask, one will be given to you upon arrival. For doctor visits, patients may have 1 support person aged 88 or older with them. For treatment visits, patients cannot have anyone with them due to current Covid guidelines and our immunocompromised population.

## 2021-05-26 NOTE — Assessment & Plan Note (Signed)
So far, she tolerated recent treatment well except for mild fatigue and pancytopenia Overall, she felt that she tolerated treatment well without major side effects We will proceed with treatment as scheduled I plan to repeat CT imaging before her next visit

## 2021-05-28 ENCOUNTER — Encounter: Payer: Self-pay | Admitting: Hematology and Oncology

## 2021-05-28 NOTE — Progress Notes (Signed)
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[facility]) So far, she tolerated recent treatment well except for mild fatigue and pancytopenia Overall, she felt that she tolerated treatment well without major side effects We will proceed with treatment as scheduled I plan to repeat CT imaging before her next visit  Pancytopenia, acquired [facility]) She had mild intermittent pancytopenia due to treatment We will observe and proceed with treatment without delay  Pulmonary embolism ([place]) She has minor mild intermittent hematochezia is self-limiting She will continue her anticoagulation therapy  Orders Placed This Encounter  Procedures   CT ABDOMEN PELVIS W CONTRAST    Standing Status:   Future    Standing Expiration Date:   05/26/2022    Order Specific Question:   If indicated for the ordered procedure, I authorize the administration of contrast media per Radiology protocol    Answer:   Yes    Order Specific Question:   Preferred imaging location?    Answer:   [facility]    Order Specific Question:   Radiology Contrast Protocol - do NOT remove file path    Answer:   [email or web address]    All questions were answered. The patient knows to call the clinic with any problems, questions or concerns. The total time spent in the appointment was 20 minutes encounter with patients including review of chart and various tests results, discussions about plan of care and coordination of care plan   [name], MD 05/28/2021 9:37 AM  INTERVAL HISTORY: Please see below for problem oriented charting. she returns for treatment follow-up for recurrent ovarian cancer She is doing well She denies major side effects from treatment No nausea or changes in bowel habits No  recent bleeding complications from anticoagulation therapy  REVIEW OF SYSTEMS:   Constitutional: Denies fevers, chills or abnormal weight loss Eyes: Denies blurriness of vision Ears, nose, mouth, throat, and face: Denies mucositis or sore throat Respiratory: Denies cough, dyspnea or wheezes Cardiovascular: Denies palpitation, chest discomfort or lower extremity swelling Gastrointestinal:  Denies nausea, heartburn or change in bowel habits Skin: Denies abnormal skin rashes Lymphatics: Denies new lymphadenopathy or easy bruising Neurological:Denies numbness, tingling or new weaknesses Behavioral/Psych: Mood is stable, no new changes  All other systems were reviewed with the patient and are negative.  I have reviewed the past medical history, past surgical history, social history and family history with the patient and they are unchanged from previous note.  ALLERGIES:  has No Known Allergies.  MEDICATIONS:  Current Outpatient Medications  Medication Sig Dispense Refill   acetaminophen (TYLENOL) 325 MG tablet Take 650 mg by mouth every 6 (six) hours as needed for moderate pain or headache.      amoxicillin (AMOXIL) 500 MG capsule Take 500 mg by mouth in the morning and at bedtime.     atenolol (TENORMIN) 25 MG tablet TAKE ONE (1) TABLET BY MOUTH EVERY DAY 30 tablet 1   cholecalciferol (VITAMIN D3) 25 MCG (1000 UNIT) tablet Take 1,000 Units by mouth daily.     ELIQUIS 5 MG TABS tablet TAKE ONE TABLET BY MOUTH TWICE DAILY 60 tablet 5   levothyroxine (SYNTHROID) 75 MCG tablet Take 1 tablet (75 mcg total) by mouth daily before breakfast. 30 tablet 3   lidocaine-prilocaine (EMLA) cream Apply to affected area once (Patient taking differently:  Apply 1 application topically daily as needed (port access).) 30 g 3   loratadine (CLARITIN) 10 MG tablet Take 10 mg by mouth daily.     Melatonin 3 MG TABS Take by mouth.     ondansetron (ZOFRAN) 8 MG tablet Take 8 mg by mouth every 8 (eight) hours as  needed.     ondansetron (ZOFRAN) 8 MG tablet Take 1 tablet (8 mg total) by mouth 2 (two) times daily as needed (Nausea or vomiting). 30 tablet 1   pantoprazole (PROTONIX) 40 MG tablet Take 1 tablet (40 mg total) by mouth daily. 30 tablet 4   prochlorperazine (COMPAZINE) 10 MG tablet Take 10 mg by mouth every 6 (six) hours as needed.     prochlorperazine (COMPAZINE) 10 MG tablet Take 1 tablet (10 mg total) by mouth every 6 (six) hours as needed (Nausea or vomiting). 30 tablet 1   senna-docusate (SENOKOT-S) 8.6-50 MG tablet Take 2 tablets by mouth at bedtime. 60 tablet 3   vitamin B-12 1000 MCG tablet Take 1 tablet (1,000 mcg total) by mouth daily. 30 tablet 3   No current facility-administered medications for this visit.    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  She has bloating and early satiety. Shortness of breath with walking. She denied bleeding. She reported constipation with pain with defecation and narrowed stools   11/29/2018 Imaging   1. 13 cm complex cystic lesion in the central pelvis, highly suspicious for ovarian cystadenocarcinoma. 2. Diffuse peritoneal carcinomatosis with mild ascites. 3. Mild lymphadenopathy in porta hepatis and right cardiophrenic angle, suspicious for metastatic disease. 4. Moderate right and tiny left pleural effusions   12/05/2018 Tumor Marker   Patient's tumor was tested for the following markers: CA-125 Results of the tumor marker test revealed 1015.   12/06/2018 Cancer Staging   Staging form: Ovary, Fallopian Tube, and Primary Peritoneal Carcinoma, AJCC 8th Edition - Clinical: FIGO Stage IVA, calculated as Stage IV (cT3c, cN1, cM1) - Signed by [name], MD on 12/06/2018    12/09/2018 Pathology Results   PLEURAL FLUID, RIGHT (SPECIMEN 1 OF  1 COLLECTED 12/09/18): - MALIGNANT CELLS CONSISTENT WITH METASTATIC ADENOCARCINOMA - SEE COMMENT Comment The neoplastic cells are positive for cytokeratin 7 and Pax-8 but negative for cytokeratin 20, TTF-1, CDX-2 and Gata-3. Overall, the phenotype is consistent with the clinical impression of gynecologic primary.    12/15/2018 Procedure   Placement of single lumen port a cath via right internal jugular vein. The catheter tip lies at the cavo-atrial junction. A power injectable port a cath was placed and is ready for immediate use   12/16/2018 - 08/24/2019 Chemotherapy   The patient had carboplatin and taxol for chemotherapy treatment.  She had 7 cycles given neoadjuvant prior to surgery and 3 more cycles after surgery, for a total of 10 cycles of treatment    01/06/2019 Tumor Marker   Patient's tumor was tested for the following markers: CA-125 Results of the tumor marker test revealed 948   02/02/2019 Imaging   1. Massive pulmonary embolism, as discussed above. Given the mildly elevated RV to LV ratio of 0.95, this is associated with increased risk of morbidity and mortality. 2. Today's study demonstrates a mixed response to therapy. Specifically, while there has been regression of the bulky intraperitoneal metastatic disease and regression  of previously noted pleural effusions, the large cystic mass in the central pelvis has increased in size compared to the prior study. 3. New right mild hydroureteronephrosis related to extrinsic compression on the distal third of the right ureter by the patient's large pelvic mass. 4. Scattered small pulmonary nodules (predominantly pleural based) appear stable compared to prior examinations. These are nonspecific but warrant continued attention on follow-up studies. 5. Aortic atherosclerosis, in addition to three-vessel coronary artery disease. Assessment for potential risk factor modification, dietary therapy or pharmacologic therapy may be warranted, if  clinically indicated.   02/02/2019 - 02/04/2019 Hospital Admission   She was admitted the hospital due to DVT and PE   02/03/2019 Imaging   Bilateral venous Doppler US Right: Findings consistent with acute deep vein thrombosis involving the right femoral vein, right popliteal vein, right peroneal veins, right soleal veins, and right gastrocnemius veins. No cystic structure found in the popliteal fossa. Left: There is no evidence of deep vein thrombosis in the lower extremity. No cystic structure found in the popliteal fossa   02/17/2019 Tumor Marker   Patient's tumor was tested for the following markers: CA-125 Results of the tumor marker test revealed 127   03/10/2019 Tumor Marker   Patient's tumor was tested for the following markers: CA-125. Results of the tumor marker test revealed 87.4   03/14/2019 - 03/16/2019 Hospital Admission   She was admitted to the hospital recently for weakness   03/31/2019 Tumor Marker   Patient's tumor was tested for the following markers: CA-125 Results of the tumor marker test revealed 59.2.   04/28/2019 Tumor Marker   Patient's tumor was tested for the following markers: CA-125 Results of the tumor marker test revealed 56.8   05/05/2019 Imaging   1. Interval decrease in size of the large cystic mass in the central pelvis. Mesenteric and omental soft tissue disease shows no substantial interval change. 2. The mild right hydroureteronephrosis seen previously has resolved in the interval. 3. Small residual nonobstructive thrombus identified in the inter lobar pulmonary artery common here into the lateral wall compatible with chronicity. 4. 14 mm subtle enhancing lesion in the anterior right liver is stable. This may be vascular malformation. Attention on follow-up recommended. 5. Stable appearance of the multiple small bilateral pulmonary nodules. Continued attention on follow-up recommended. 6.  Aortic Atherosclerois (ICD10-170.0)     06/06/2019 Pathology  Results   A. OMENTUM, RESECTION: - Metastatic carcinoma. B. RIGHT FALLOPIAN TUBE AND OVARY, SALPINGOOOPHORECTOMY: - High-grade serous carcinoma, spanning 9 cm. - No surface involvement identified. - Fallopian tube involved by carcinoma. - See oncology table. C. PERITONEAL NODULE, EXCISION: - Metastatic carcinoma. ONCOLOGY TABLE: OVARY or FALLOPIAN TUBE or PRIMARY PERITONEUM: Procedure: Right salpingo-oophorectomy, omental resection, and peritoneal biopsy. Specimen Integrity: Intact Tumor Site: Right ovary Ovarian Surface Involvement (required only if applicable): Not identified Tumor Size: 9 cm Histologic Type: High-grade serous carcinoma Histologic Grade: High-grade Implants (required for advanced stage serous/seromucinous borderline tumors only): Omentum, peritoneum. Other Tissue/ Organ Involvement: Right fallopian tube Largest Extrapelvic Peritoneal Focus (required only if applicable): 7.3 cm Peritoneal/Ascitic Fluid: Positive pleural fluid pre neoadjuvant therapy. Treatment Effect (required only for high-grade serous carcinomas): Probable treatment effect present. Regional Lymph Nodes: No lymph nodes submitted or found Pathologic Stage Classification (pTNM, AJCC 8th Edition): ypT3c, ypNX Representative Tumor Block: B2   06/06/2019 Surgery   Preoperative Diagnosis: stage IV ovarian cancer, s/p neoadjuvant chemotherapy, history of recent PE.  Procedure(s) Performed: Exploratory laparotomy with right salpingo-oophorectomy, omentectomy radical tumor debulking for  ovarian cancer .   Surgeon: Thereasa Solo, MD.    Operative Findings:  Omental cake adherent to anterior abdominal wall and hepatic flexure. 10cm right tube and ovary. Surgically absent uterus and left tube and ovary. Granular nodularity across right diaphragm.    This represented an optimal cytoreduction (R0) with no gross visible disease remaining.    07/06/2019 Tumor Marker   Patient's tumor was tested for the following  markers:CA-125 Results of the tumor marker test revealed 16.4   08/10/2019 Genetic Testing   Negative genetic testing. No pathogenic variants identified. VUS in BRCA2 called [id number] identified on the [facility] CancerNext+RNAinsight panel. The report date is 08/10/2019.  The CancerNext+RNAinsight gene panel offered by [employer] [facility] includes sequencing and rearrangement analysis for the following 36 genes: APC*, ATM*, AXIN2, BARD1, BMPR1A, BRCA1*, BRCA2*, BRIP1*, CDH1*, CDK4, CDKN2A, CHEK2*, DICER1, MLH1*, MSH2*, MSH3, MSH6*, MUTYH*, NBN, NF1*, NTHL1, PALB2*, PMS2*, PTEN*, RAD51C*, RAD51D*, RECQL, SMAD4, SMARCA4, STK11 and TP53* (sequencing and deletion/duplication); HOXB13, POLD1 and POLE (sequencing only); EPCAM and GREM1 (deletion/duplication only). DNA and RNA analyses performed for * genes.   HRD (somatic) testing was initially ordered and was not completed due to insufficient amount of tumor sample.    08/24/2019 Tumor Marker   Patient's tumor was tested for the following markers: CA-125 Results of the tumor marker test revealed 12.2   09/20/2019 Imaging   1. Interval debulking, bilateral salpingo oophorectomy and omentectomy with resection of the dominant pelvic cystic lesion and the bandlike soft tissue seen previously in the right mesentery/omentum. Today's study is status shows new postoperative baseline for follow-up. 2. 6 mm gastrohepatic ligament nodule seen on the previous study is stable. Continued attention on follow-up recommended. 3. No new or progressive findings in the chest, abdomen, or pelvis to suggest disease progression. 4. Stable 1.4 cm focus of homogeneous enhancement in the anterior right liver. Stable since at least 11/29/2018 and likely benign. Continued attention on follow-up recommended. 5. Aortic Atherosclerosis (ICD10-I70.0).   09/20/2019 Tumor Marker   Patient's tumor was tested for the following markers: CA-125 Results of the tumor marker test revealed 11.9    12/26/2019 Tumor Marker   Patient's tumor was tested for the following markers: CA-125 Results of the tumor marker test revealed 16.5   03/25/2020 Imaging   1. Interval progression of peritoneal disease which predominantly involves the serosal surface of the proximal and distal transverse colon, and descending colon. 2. New bilateral inguinal adenopathy. 3. No ascites. 4. Aortic atherosclerosis.   03/25/2020 Tumor Marker   Patient's tumor was tested for the following markers: CA-125 Results of the tumor marker test revealed 249   03/28/2020 Echocardiogram    1. Borderline LV Strain; A2C view represents the most accurate acquisition.. Left ventricular ejection fraction, by estimation, is 60 to 65%. Left ventricular ejection fraction by PLAX is 63 %. The left ventricle has normal function. The left ventricle demonstrates regional wall motion abnormalities (see scoring diagram/findings for description). Left ventricular diastolic parameters were normal.  2. Right ventricular systolic function is normal. The right ventricular size is normal.  3. Left atrial size was mildly dilated.  4. The mitral valve is normal in structure. No evidence of mitral valve regurgitation.  5. The aortic valve is grossly normal. Aortic valve regurgitation is mild.  6. The inferior vena cava is normal in size with greater than 50% respiratory variability, suggesting right atrial pressure of 3 mmHg.   04/02/2020 Tumor Marker   Patient's tumor was tested for the following markers:  CA-125 Results of the tumor marker test revealed 304   04/02/2020 - 08/22/2020 Chemotherapy   The patient had carboplatin and Doxil for chemotherapy treatment.     05/27/2020 Tumor Marker   Patient's tumor was tested for the following markers: CA-125. Results of the tumor marker test revealed 42.7   06/10/2020 Imaging   Improving peritoneal disease and serosal implants along the left colon, as above.   Improving bilateral inguinal  nodes.   06/18/2020 Echocardiogram    1. Left ventricular ejection fraction, by estimation, is 60 to 65%. The left ventricle has normal function. The left ventricle has no regional wall motion abnormalities. There is mild left ventricular hypertrophy of the basal-septal segment. Left ventricular diastolic parameters were normal. The average left ventricular global longitudinal strain is -25.0 %. The global longitudinal strain is normal.  2. Right ventricular systolic function is normal. The right ventricular size is normal.  3. The mitral valve is grossly normal. Mild mitral valve regurgitation.  4. Tricuspid valve regurgitation is moderate.  5. The aortic valve is tricuspid. There is mild calcification of the aortic valve. There is mild thickening of the aortic valve. Aortic valve regurgitation is mild to moderate.  6. The inferior vena cava is normal in size with <50% respiratory variability, suggesting right atrial pressure of 8 mmHg.   06/25/2020 Tumor Marker   Patient's tumor was tested for the following markers: CA-125 Results of the tumor marker test revealed 23.9   07/15/2020 Imaging   IMPRESSION: 1. Mild amount of acute pulmonary embolism within multiple middle lobe and lower lobe branches of the right pulmonary artery. 2.   Small amount of chronic right pulmonary embolism. 3.   Mild posterior left basilar atelectasis and/or infiltrate. 4.   Small left pleural effusion. 5. Aortic atherosclerosis.   Aortic Atherosclerosis (ICD10-I70.0).     07/15/2020 - 07/17/2020 Hospital Admission   She was admitted to a local hospital after presentation with chest pain and shortness of breath and was found to have significant pulmonary embolism She was anticoagulated and discharged   07/16/2020 Echocardiogram   1. Left ventricular ejection fraction, by estimation, is 60 to 65%. The left ventricle has normal function. The left ventricle has no regional wall motion abnormalities. There is mild left  ventricular hypertrophy.  Left ventricular diastolic parameters are indeterminate.   2. Right ventricular systolic function is normal. The right ventricular size is normal. There is normal pulmonary artery systolic pressure. The estimated right ventricular systolic pressure is 21.1 mmHg.   3. The mitral valve is grossly normal. Trivial mitral valve regurgitation.   4. The aortic valve is tricuspid. Aortic valve regurgitation is mild.   5. The inferior vena cava is normal in size with greater than 50% respiratory variability, suggesting right atrial pressure of 3 mmHg.    07/25/2020 Tumor Marker   Patient's tumor was tested for the following markers: CA-125 Results of the tumor marker test revealed 36.4   09/09/2020 Imaging   1. Mild improvement in peritoneal and serosal metastasis along the splenic flexure and descending colon. 2. Inguinal lymph nodes are slightly increased in size. Lymph nodes increased in size from 06/10/2020 and similar to CT of 03/25/2020. 3. No new peritoneal disease.  No free fluid   09/21/2020 - 10/21/2020 Chemotherapy   She started taking Niraparib, discontinued due to intolerable side effects from headache and hypertension       10/01/2020 Tumor Marker   Patient's tumor was tested for the following markers: CA-125 Results of  the tumor marker test revealed 83.4.   10/16/2020 Imaging   No acute findings in the abdomen or pelvis.   Hepatic steatosis.   Large stool burden throughout the colon.       12/03/2020 Tumor Marker   Patient's tumor was tested for the following markers: CA-125 Results of the tumor marker test revealed 146   12/20/2020 Imaging   1. Signs of peritoneal carcinomatosis. No significant interval change from previous exam. 2. No findings of solid organ metastasis or nodal metastasis within the abdomen or pelvis. Enlarged right inguinal node is mildly increased in size. Stable enlarged left inguinal node. 3. No evidence for metastatic disease to  the chest. 4. Aortic atherosclerosis. Coronary artery calcifications.   12/27/2020 - 03/07/2021 Chemotherapy    Patient is on PACLITAXEL       02/07/2021 Tumor Marker   Patient's tumor was tested for the following markers: CA-125. Results of the tumor marker test revealed 395.   02/28/2021 Tumor Marker   Patient's tumor was tested for the following markers: CA-125. Results of the tumor marker test revealed 548.   03/20/2021 Imaging   1. Interval enlargement of multiple peritoneal soft tissue masses and nodules. 2. Interval enlargement of portacaval lymph nodes as well as slight interval enlargement of bilateral inguinal lymph nodes. 3. Findings are consistent with worsened nodal and peritoneal metastatic disease. 4. Status post hysterectomy.   Aortic Atherosclerosis (ICD10-I70.0).   03/27/2021 Echocardiogram   1. Left ventricular ejection fraction, by estimation, is 60 to 65%. The left ventricle has normal function. The left ventricle has no regional wall motion abnormalities. There is mild asymmetric left ventricular hypertrophy of the basal-septal segment. Left ventricular diastolic parameters were normal.  2. Right ventricular systolic function is normal. The right ventricular size is normal.  3. The mitral valve is normal in structure. Trivial mitral valve regurgitation. No evidence of mitral stenosis.  4. The aortic valve is tricuspid. Aortic valve regurgitation is moderate. No aortic stenosis is present.  5. The inferior vena cava is normal in size with greater than 50% respiratory variability, suggesting right atrial pressure of 3 mmHg.   03/28/2021 -  Chemotherapy   Patient is on Treatment Plan : OVARIAN RECURRENT Liposomal Doxorubicin + Carboplatin q28d X 6 Cycles     03/28/2021 Tumor Marker   Patient's tumor was tested for the following markers: CA-125. Results of the tumor marker test revealed 991.   04/28/2021 Tumor Marker   Patient's tumor was tested for the following  markers: CA-125. Results of the tumor marker test revealed 992.     PHYSICAL EXAMINATION: ECOG PERFORMANCE STATUS: 1 - Symptomatic but completely ambulatory  Vitals:   05/26/21 1012  BP: (!) 131/53  Pulse: 67  Resp: 18  Temp: 98.2 F (36.8 C)  SpO2: 98%   Filed Weights   05/26/21 1012  Weight: 219 lb 9.6 oz (99.6 kg)    GENERAL:alert, no distress and comfortable SKIN: skin color, texture, turgor are normal, no rashes or significant lesions EYES: normal, Conjunctiva are pink and non-injected, sclera clear OROPHARYNX:no exudate, no erythema and lips, buccal mucosa, and tongue normal  NECK: supple, thyroid normal size, non-tender, without nodularity LYMPH:  no palpable lymphadenopathy in the cervical, axillary or inguinal LUNGS: clear to auscultation and percussion with normal breathing effort HEART: regular rate & rhythm and no murmurs and no lower extremity edema ABDOMEN:abdomen soft, non-tender and normal bowel sounds Musculoskeletal:no cyanosis of digits and no clubbing  NEURO: alert & oriented x 3 with  fluent speech, no focal motor/sensory deficits  LABORATORY DATA:  I have reviewed the data as listed    Component Value Date/Time   NA 138 05/26/2021 0935   K 4.0 05/26/2021 0935   CL 104 05/26/2021 0935   CO2 25 05/26/2021 0935   GLUCOSE 127 (H) 05/26/2021 0935   BUN 16 05/26/2021 0935   CREATININE 0.93 05/26/2021 0935   CALCIUM 9.5 05/26/2021 0935   PROT 6.9 05/26/2021 0935   ALBUMIN 3.6 05/26/2021 0935   AST 20 05/26/2021 0935   ALT 21 05/26/2021 0935   ALKPHOS 90 05/26/2021 0935   BILITOT 0.3 05/26/2021 0935   GFRNONAA >60 05/26/2021 0935   GFRAA >60 03/25/2020 1144    No results found for: SPEP, UPEP  Lab Results  Component Value Date   WBC 3.0 (L) 05/26/2021   NEUTROABS 1.4 (L) 05/26/2021   HGB 10.4 (L) 05/26/2021   HCT 30.2 (L) 05/26/2021   MCV 103.1 (H) 05/26/2021   PLT 181 05/26/2021      Chemistry      Component Value Date/Time   NA 138  05/26/2021 0935   K 4.0 05/26/2021 0935   CL 104 05/26/2021 0935   CO2 25 05/26/2021 0935   BUN 16 05/26/2021 0935   CREATININE 0.93 05/26/2021 0935      Component Value Date/Time   CALCIUM 9.5 05/26/2021 0935   ALKPHOS 90 05/26/2021 0935   AST 20 05/26/2021 0935   ALT 21 05/26/2021 0935   BILITOT 0.3 05/26/2021 0935

## 2021-05-28 NOTE — Assessment & Plan Note (Signed)
She has minor mild intermittent hematochezia is self-limiting She will continue her anticoagulation therapy

## 2021-05-28 NOTE — Assessment & Plan Note (Signed)
She had mild intermittent pancytopenia due to treatment We will observe and proceed with treatment without delay

## 2021-06-02 DIAGNOSIS — J329 Chronic sinusitis, unspecified: Secondary | ICD-10-CM | POA: Diagnosis not present

## 2021-06-13 ENCOUNTER — Other Ambulatory Visit: Payer: Self-pay

## 2021-06-13 ENCOUNTER — Ambulatory Visit (HOSPITAL_COMMUNITY)
Admission: RE | Admit: 2021-06-13 | Discharge: 2021-06-13 | Disposition: A | Discharge status: Home / Self Care | Payer: Medicare Other | Source: Ambulatory Visit | Attending: Hematology and Oncology | Admitting: Hematology and Oncology

## 2021-06-13 DIAGNOSIS — C561 Malignant neoplasm of right ovary: Secondary | ICD-10-CM | POA: Insufficient documentation

## 2021-06-13 DIAGNOSIS — R590 Localized enlarged lymph nodes: Secondary | ICD-10-CM | POA: Diagnosis not present

## 2021-06-13 DIAGNOSIS — I700 Atherosclerosis of aorta: Secondary | ICD-10-CM | POA: Diagnosis not present

## 2021-06-13 MED ORDER — SODIUM CHLORIDE (PF) 0.9 % IJ SOLN
INTRAMUSCULAR | Status: AC
  Filled 2021-06-13: qty 50

## 2021-06-13 MED ORDER — IOHEXOL 350 MG/ML SOLN
80.0000 mL | Freq: Once | INTRAVENOUS | Status: AC | PRN
  Administered 2021-06-13: 80 mL via INTRAVENOUS

## 2021-06-13 MED ORDER — HEPARIN SOD (PORK) LOCK FLUSH 100 UNIT/ML IV SOLN
500.0000 [IU] | Freq: Once | INTRAVENOUS | Status: AC
  Administered 2021-06-13: 500 [IU] via INTRAVENOUS

## 2021-06-16 ENCOUNTER — Other Ambulatory Visit: Payer: Self-pay

## 2021-06-16 ENCOUNTER — Encounter: Payer: Self-pay | Admitting: Hematology and Oncology

## 2021-06-16 ENCOUNTER — Telehealth: Payer: Self-pay

## 2021-06-16 ENCOUNTER — Inpatient Hospital Stay: Payer: Medicare Other | Attending: Gynecologic Oncology | Admitting: Hematology and Oncology

## 2021-06-16 VITALS — BP 161/70 | HR 78 | Temp 97.8°F | Resp 18 | Ht 67.0 in | Wt 221.4 lb

## 2021-06-16 DIAGNOSIS — C786 Secondary malignant neoplasm of retroperitoneum and peritoneum: Secondary | ICD-10-CM | POA: Diagnosis not present

## 2021-06-16 DIAGNOSIS — Z9221 Personal history of antineoplastic chemotherapy: Secondary | ICD-10-CM | POA: Diagnosis not present

## 2021-06-16 DIAGNOSIS — Z79899 Other long term (current) drug therapy: Secondary | ICD-10-CM | POA: Insufficient documentation

## 2021-06-16 DIAGNOSIS — Z90721 Acquired absence of ovaries, unilateral: Secondary | ICD-10-CM | POA: Insufficient documentation

## 2021-06-16 DIAGNOSIS — N183 Chronic kidney disease, stage 3 unspecified: Secondary | ICD-10-CM | POA: Insufficient documentation

## 2021-06-16 DIAGNOSIS — Z5111 Encounter for antineoplastic chemotherapy: Secondary | ICD-10-CM | POA: Diagnosis not present

## 2021-06-16 DIAGNOSIS — Z7901 Long term (current) use of anticoagulants: Secondary | ICD-10-CM | POA: Diagnosis not present

## 2021-06-16 DIAGNOSIS — C561 Malignant neoplasm of right ovary: Secondary | ICD-10-CM | POA: Insufficient documentation

## 2021-06-16 DIAGNOSIS — R5383 Other fatigue: Secondary | ICD-10-CM | POA: Diagnosis not present

## 2021-06-16 DIAGNOSIS — D61818 Other pancytopenia: Secondary | ICD-10-CM | POA: Insufficient documentation

## 2021-06-16 DIAGNOSIS — I10 Essential (primary) hypertension: Secondary | ICD-10-CM | POA: Insufficient documentation

## 2021-06-16 DIAGNOSIS — I2699 Other pulmonary embolism without acute cor pulmonale: Secondary | ICD-10-CM | POA: Diagnosis not present

## 2021-06-16 DIAGNOSIS — L089 Local infection of the skin and subcutaneous tissue, unspecified: Secondary | ICD-10-CM | POA: Diagnosis not present

## 2021-06-16 MED ORDER — CEPHALEXIN 500 MG PO CAPS: 500.0000 mg | ORAL_CAPSULE | Freq: Three times a day (TID) | ORAL | 0 refills | Status: DC

## 2021-06-16 NOTE — Progress Notes (Addendum)
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[facility]) I have reviewed multiple imaging studies with the patient and family Overall, she tolerated treatment well except for mild fatigue My recommendation would be to proceed with treatment as scheduled next week and to repeat imaging study again in March Due to holidays, I think it is acceptable to order echocardiogram after her next treatment  Essential hypertension Her blood pressure is elevated today, likely due to anxiety We will proceed with treatment next week without delay  Skin infection She had minor recurrent skin infection near her buttock region likely due to an infected boil I recommend a course of antibiotics We will not delay her treatment  Pancytopenia, acquired ([place]) She has mild stable pancytopenia She does not need transfusion support or G-CSF support  Orders Placed This Encounter  Procedures   ECHOCARDIOGRAM COMPLETE    Standing Status:   Future    Standing Expiration Date:   06/16/2022    Order Specific Question:   Where should this test be performed    Answer:   [facility]    Order Specific Question:   Perflutren DEFINITY (image enhancing agent) should be administered unless hypersensitivity or allergy exist    Answer:   Administer Perflutren    Order Specific Question:   Reason for exam-Echo    Answer:   Chemo  [id number]    All questions were answered. The patient knows to call the clinic with any problems, questions or concerns. The total time spent in the appointment was 30 minutes encounter with patients including review of chart and various tests results, discussions about plan of care and coordination of care plan   [name], MD 06/16/2021 9:16 AM  INTERVAL HISTORY: Please see below for problem oriented  charting. she returns for treatment follow-up with her family She tolerated last cycle of treatment well except for fatigue She thought she had recurrent skin infection again Otherwise, denies nausea or changes in bowel habits Her energy level is fair The patient denies any recent signs or symptoms of bleeding such as spontaneous epistaxis, hematuria or hematochezia.   REVIEW OF SYSTEMS:   Constitutional: Denies fevers, chills or abnormal weight loss Eyes: Denies blurriness of vision Ears, nose, mouth, throat, and face: Denies mucositis or sore throat Respiratory: Denies cough, dyspnea or wheezes Cardiovascular: Denies palpitation, chest discomfort or lower extremity swelling Gastrointestinal:  Denies nausea, heartburn or change in bowel habits Lymphatics: Denies new lymphadenopathy or easy bruising Neurological:Denies numbness, tingling or new weaknesses Behavioral/Psych: Mood is stable, no new changes  All other systems were reviewed with the patient and are negative.  I have reviewed the past medical history, past surgical history, social history and family history with the patient and they are unchanged from previous note.  ALLERGIES:  has No Known Allergies.  MEDICATIONS:  Current Outpatient Medications  Medication Sig Dispense Refill   cephALEXin (KEFLEX) 500 MG capsule Take 1 capsule (500 mg total) by mouth 3 (three) times daily. 21 capsule 0   acetaminophen (TYLENOL) 325 MG tablet Take 650 mg by mouth every 6 (six) hours as needed for moderate pain or headache.      amoxicillin (AMOXIL) 500 MG capsule Take 500 mg by mouth in the morning and at bedtime.     atenolol (TENORMIN) 25 MG tablet TAKE ONE (  1) TABLET BY MOUTH EVERY DAY 30 tablet 1   cholecalciferol (VITAMIN D3) 25 MCG (1000 UNIT) tablet Take 1,000 Units by mouth daily.     ELIQUIS 5 MG TABS tablet TAKE ONE TABLET BY MOUTH TWICE DAILY 60 tablet 5   levothyroxine (SYNTHROID) 75 MCG tablet Take 1 tablet (75 mcg total)  by mouth daily before breakfast. 30 tablet 3   lidocaine-prilocaine (EMLA) cream Apply to affected area once (Patient taking differently: Apply 1 application topically daily as needed (port access).) 30 g 3   loratadine (CLARITIN) 10 MG tablet Take 10 mg by mouth daily.     Melatonin 3 MG TABS Take by mouth.     ondansetron (ZOFRAN) 8 MG tablet Take 8 mg by mouth every 8 (eight) hours as needed.     ondansetron (ZOFRAN) 8 MG tablet Take 1 tablet (8 mg total) by mouth 2 (two) times daily as needed (Nausea or vomiting). 30 tablet 1   pantoprazole (PROTONIX) 40 MG tablet Take 1 tablet (40 mg total) by mouth daily. 30 tablet 4   prochlorperazine (COMPAZINE) 10 MG tablet Take 10 mg by mouth every 6 (six) hours as needed.     prochlorperazine (COMPAZINE) 10 MG tablet Take 1 tablet (10 mg total) by mouth every 6 (six) hours as needed (Nausea or vomiting). 30 tablet 1   senna-docusate (SENOKOT-S) 8.6-50 MG tablet Take 2 tablets by mouth at bedtime. 60 tablet 3   vitamin B-12 1000 MCG tablet Take 1 tablet (1,000 mcg total) by mouth daily. 30 tablet 3   No current facility-administered medications for this visit.    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  She has bloating and early satiety. Shortness of breath with walking. She denied bleeding. She reported constipation with pain with defecation and narrowed stools   11/29/2018 Imaging   1. 13 cm complex cystic lesion in the central pelvis, highly suspicious for ovarian cystadenocarcinoma. 2. Diffuse peritoneal carcinomatosis with mild ascites. 3. Mild lymphadenopathy in porta hepatis and right cardiophrenic angle, suspicious for metastatic disease. 4. Moderate right and tiny left pleural effusions   12/05/2018 Tumor Marker   Patient's tumor  was tested for the following markers: CA-125 Results of the tumor marker test revealed 1015.   12/06/2018 Cancer Staging   Staging form: Ovary, Fallopian Tube, and Primary Peritoneal Carcinoma, AJCC 8th Edition - Clinical: FIGO Stage IVA, calculated as Stage IV (cT3c, cN1, cM1) - Signed by [name], MD on 12/06/2018    12/09/2018 Pathology Results   PLEURAL FLUID, RIGHT (SPECIMEN 1 OF 1 COLLECTED 12/09/18): - MALIGNANT CELLS CONSISTENT WITH METASTATIC ADENOCARCINOMA - SEE COMMENT Comment The neoplastic cells are positive for cytokeratin 7 and Pax-8 but negative for cytokeratin 20, TTF-1, CDX-2 and Gata-3. Overall, the phenotype is consistent with the clinical impression of gynecologic primary.    12/15/2018 Procedure   Placement of single lumen port a cath via right internal jugular vein. The catheter tip lies at the cavo-atrial junction. A power injectable port a cath was placed and is ready for immediate use   12/16/2018 - 08/24/2019 Chemotherapy   The patient had carboplatin and taxol for chemotherapy treatment.  She had 7 cycles given neoadjuvant prior to surgery and 3 more cycles after surgery, for a total of 10 cycles of treatment  01/06/2019 Tumor Marker   Patient's tumor was tested for the following markers: CA-125 Results of the tumor marker test revealed 948   02/02/2019 Imaging   1. Massive pulmonary embolism, as discussed above. Given the mildly elevated RV to LV ratio of 0.95, this is associated with increased risk of morbidity and mortality. 2. Today's study demonstrates a mixed response to therapy. Specifically, while there has been regression of the bulky intraperitoneal metastatic disease and regression of previously noted pleural effusions, the large cystic mass in the central pelvis has increased in size compared to the prior study. 3. New right mild hydroureteronephrosis related to extrinsic compression on the distal third of the right ureter by the patient's large pelvic  mass. 4. Scattered small pulmonary nodules (predominantly pleural based) appear stable compared to prior examinations. These are nonspecific but warrant continued attention on follow-up studies. 5. Aortic atherosclerosis, in addition to three-vessel coronary artery disease. Assessment for potential risk factor modification, dietary therapy or pharmacologic therapy may be warranted, if clinically indicated.   02/02/2019 - 02/04/2019 Hospital Admission   She was admitted the hospital due to DVT and PE   02/03/2019 Imaging   Bilateral venous Doppler US Right: Findings consistent with acute deep vein thrombosis involving the right femoral vein, right popliteal vein, right peroneal veins, right soleal veins, and right gastrocnemius veins. No cystic structure found in the popliteal fossa. Left: There is no evidence of deep vein thrombosis in the lower extremity. No cystic structure found in the popliteal fossa   02/17/2019 Tumor Marker   Patient's tumor was tested for the following markers: CA-125 Results of the tumor marker test revealed 127   03/10/2019 Tumor Marker   Patient's tumor was tested for the following markers: CA-125. Results of the tumor marker test revealed 87.4   03/14/2019 - 03/16/2019 Hospital Admission   She was admitted to the hospital recently for weakness   03/31/2019 Tumor Marker   Patient's tumor was tested for the following markers: CA-125 Results of the tumor marker test revealed 59.2.   04/28/2019 Tumor Marker   Patient's tumor was tested for the following markers: CA-125 Results of the tumor marker test revealed 56.8   05/05/2019 Imaging   1. Interval decrease in size of the large cystic mass in the central pelvis. Mesenteric and omental soft tissue disease shows no substantial interval change. 2. The mild right hydroureteronephrosis seen previously has resolved in the interval. 3. Small residual nonobstructive thrombus identified in the inter lobar pulmonary artery common  here into the lateral wall compatible with chronicity. 4. 14 mm subtle enhancing lesion in the anterior right liver is stable. This may be vascular malformation. Attention on follow-up recommended. 5. Stable appearance of the multiple small bilateral pulmonary nodules. Continued attention on follow-up recommended. 6.  Aortic Atherosclerois (ICD10-170.0)     06/06/2019 Pathology Results   A. OMENTUM, RESECTION: - Metastatic carcinoma. B. RIGHT FALLOPIAN TUBE AND OVARY, SALPINGOOOPHORECTOMY: - High-grade serous carcinoma, spanning 9 cm. - No surface involvement identified. - Fallopian tube involved by carcinoma. - See oncology table. C. PERITONEAL NODULE, EXCISION: - Metastatic carcinoma. ONCOLOGY TABLE: OVARY or FALLOPIAN TUBE or PRIMARY PERITONEUM: Procedure: Right salpingo-oophorectomy, omental resection, and peritoneal biopsy. Specimen Integrity: Intact Tumor Site: Right ovary Ovarian Surface Involvement (required only if applicable): Not identified Tumor Size: 9 cm Histologic Type: High-grade serous carcinoma Histologic Grade: High-grade Implants (required for advanced stage serous/seromucinous borderline tumors only): Omentum, peritoneum. Other Tissue/ Organ Involvement: Right fallopian tube Largest Extrapelvic Peritoneal Focus (required only  if applicable): 7.3 cm Peritoneal/Ascitic Fluid: Positive pleural fluid pre neoadjuvant therapy. Treatment Effect (required only for high-grade serous carcinomas): Probable treatment effect present. Regional Lymph Nodes: No lymph nodes submitted or found Pathologic Stage Classification (pTNM, AJCC 8th Edition): ypT3c, ypNX Representative Tumor Block: B2   06/06/2019 Surgery   Preoperative Diagnosis: stage IV ovarian cancer, s/p neoadjuvant chemotherapy, history of recent PE.  Procedure(s) Performed: Exploratory laparotomy with right salpingo-oophorectomy, omentectomy radical tumor debulking for ovarian cancer .   Surgeon: Thereasa Solo,  MD.    Operative Findings:  Omental cake adherent to anterior abdominal wall and hepatic flexure. 10cm right tube and ovary. Surgically absent uterus and left tube and ovary. Granular nodularity across right diaphragm.    This represented an optimal cytoreduction (R0) with no gross visible disease remaining.    07/06/2019 Tumor Marker   Patient's tumor was tested for the following markers:CA-125 Results of the tumor marker test revealed 16.4   08/10/2019 Genetic Testing   Negative genetic testing. No pathogenic variants identified. VUS in BRCA2 called [id number] identified on the [facility] CancerNext+RNAinsight panel. The report date is 08/10/2019.  The CancerNext+RNAinsight gene panel offered by [employer] [facility] includes sequencing and rearrangement analysis for the following 36 genes: APC*, ATM*, AXIN2, BARD1, BMPR1A, BRCA1*, BRCA2*, BRIP1*, CDH1*, CDK4, CDKN2A, CHEK2*, DICER1, MLH1*, MSH2*, MSH3, MSH6*, MUTYH*, NBN, NF1*, NTHL1, PALB2*, PMS2*, PTEN*, RAD51C*, RAD51D*, RECQL, SMAD4, SMARCA4, STK11 and TP53* (sequencing and deletion/duplication); HOXB13, POLD1 and POLE (sequencing only); EPCAM and GREM1 (deletion/duplication only). DNA and RNA analyses performed for * genes.   HRD (somatic) testing was initially ordered and was not completed due to insufficient amount of tumor sample.    08/24/2019 Tumor Marker   Patient's tumor was tested for the following markers: CA-125 Results of the tumor marker test revealed 12.2   09/20/2019 Imaging   1. Interval debulking, bilateral salpingo oophorectomy and omentectomy with resection of the dominant pelvic cystic lesion and the bandlike soft tissue seen previously in the right mesentery/omentum. Today's study is status shows new postoperative baseline for follow-up. 2. 6 mm gastrohepatic ligament nodule seen on the previous study is stable. Continued attention on follow-up recommended. 3. No new or progressive findings in the chest, abdomen, or pelvis to suggest  disease progression. 4. Stable 1.4 cm focus of homogeneous enhancement in the anterior right liver. Stable since at least 11/29/2018 and likely benign. Continued attention on follow-up recommended. 5. Aortic Atherosclerosis (ICD10-I70.0).   09/20/2019 Tumor Marker   Patient's tumor was tested for the following markers: CA-125 Results of the tumor marker test revealed 11.9   12/26/2019 Tumor Marker   Patient's tumor was tested for the following markers: CA-125 Results of the tumor marker test revealed 16.5   03/25/2020 Imaging   1. Interval progression of peritoneal disease which predominantly involves the serosal surface of the proximal and distal transverse colon, and descending colon. 2. New bilateral inguinal adenopathy. 3. No ascites. 4. Aortic atherosclerosis.   03/25/2020 Tumor Marker   Patient's tumor was tested for the following markers: CA-125 Results of the tumor marker test revealed 249   03/28/2020 Echocardiogram    1. Borderline LV Strain; A2C view represents the most accurate acquisition.. Left ventricular ejection fraction, by estimation, is 60 to 65%. Left ventricular ejection fraction by PLAX is 63 %. The left ventricle has normal function. The left ventricle demonstrates regional wall motion abnormalities (see scoring diagram/findings for description). Left ventricular diastolic parameters were normal.  2. Right ventricular systolic function is normal. The right  ventricular size is normal.  3. Left atrial size was mildly dilated.  4. The mitral valve is normal in structure. No evidence of mitral valve regurgitation.  5. The aortic valve is grossly normal. Aortic valve regurgitation is mild.  6. The inferior vena cava is normal in size with greater than 50% respiratory variability, suggesting right atrial pressure of 3 mmHg.   04/02/2020 Tumor Marker   Patient's tumor was tested for the following markers: CA-125 Results of the tumor marker test revealed 304   04/02/2020 -  08/22/2020 Chemotherapy   The patient had carboplatin and Doxil for chemotherapy treatment.     05/27/2020 Tumor Marker   Patient's tumor was tested for the following markers: CA-125. Results of the tumor marker test revealed 42.7   06/10/2020 Imaging   Improving peritoneal disease and serosal implants along the left colon, as above.   Improving bilateral inguinal nodes.   06/18/2020 Echocardiogram    1. Left ventricular ejection fraction, by estimation, is 60 to 65%. The left ventricle has normal function. The left ventricle has no regional wall motion abnormalities. There is mild left ventricular hypertrophy of the basal-septal segment. Left ventricular diastolic parameters were normal. The average left ventricular global longitudinal strain is -25.0 %. The global longitudinal strain is normal.  2. Right ventricular systolic function is normal. The right ventricular size is normal.  3. The mitral valve is grossly normal. Mild mitral valve regurgitation.  4. Tricuspid valve regurgitation is moderate.  5. The aortic valve is tricuspid. There is mild calcification of the aortic valve. There is mild thickening of the aortic valve. Aortic valve regurgitation is mild to moderate.  6. The inferior vena cava is normal in size with <50% respiratory variability, suggesting right atrial pressure of 8 mmHg.   06/25/2020 Tumor Marker   Patient's tumor was tested for the following markers: CA-125 Results of the tumor marker test revealed 23.9   07/15/2020 Imaging   IMPRESSION: 1. Mild amount of acute pulmonary embolism within multiple middle lobe and lower lobe branches of the right pulmonary artery. 2.   Small amount of chronic right pulmonary embolism. 3.   Mild posterior left basilar atelectasis and/or infiltrate. 4.   Small left pleural effusion. 5. Aortic atherosclerosis.   Aortic Atherosclerosis (ICD10-I70.0).     07/15/2020 - 07/17/2020 Hospital Admission   She was admitted to a local  hospital after presentation with chest pain and shortness of breath and was found to have significant pulmonary embolism She was anticoagulated and discharged   07/16/2020 Echocardiogram   1. Left ventricular ejection fraction, by estimation, is 60 to 65%. The left ventricle has normal function. The left ventricle has no regional wall motion abnormalities. There is mild left ventricular hypertrophy.  Left ventricular diastolic parameters are indeterminate.   2. Right ventricular systolic function is normal. The right ventricular size is normal. There is normal pulmonary artery systolic pressure. The estimated right ventricular systolic pressure is 38.2 mmHg.   3. The mitral valve is grossly normal. Trivial mitral valve regurgitation.   4. The aortic valve is tricuspid. Aortic valve regurgitation is mild.   5. The inferior vena cava is normal in size with greater than 50% respiratory variability, suggesting right atrial pressure of 3 mmHg.    07/25/2020 Tumor Marker   Patient's tumor was tested for the following markers: CA-125 Results of the tumor marker test revealed 36.4   09/09/2020 Imaging   1. Mild improvement in peritoneal and serosal metastasis along the splenic flexure  and descending colon. 2. Inguinal lymph nodes are slightly increased in size. Lymph nodes increased in size from 06/10/2020 and similar to CT of 03/25/2020. 3. No new peritoneal disease.  No free fluid   09/21/2020 - 10/21/2020 Chemotherapy   She started taking Niraparib, discontinued due to intolerable side effects from headache and hypertension       10/01/2020 Tumor Marker   Patient's tumor was tested for the following markers: CA-125 Results of the tumor marker test revealed 83.4.   10/16/2020 Imaging   No acute findings in the abdomen or pelvis.   Hepatic steatosis.   Large stool burden throughout the colon.       12/03/2020 Tumor Marker   Patient's tumor was tested for the following markers: CA-125 Results  of the tumor marker test revealed 146   12/20/2020 Imaging   1. Signs of peritoneal carcinomatosis. No significant interval change from previous exam. 2. No findings of solid organ metastasis or nodal metastasis within the abdomen or pelvis. Enlarged right inguinal node is mildly increased in size. Stable enlarged left inguinal node. 3. No evidence for metastatic disease to the chest. 4. Aortic atherosclerosis. Coronary artery calcifications.   12/27/2020 - 03/07/2021 Chemotherapy    Patient is on PACLITAXEL       02/07/2021 Tumor Marker   Patient's tumor was tested for the following markers: CA-125. Results of the tumor marker test revealed 395.   02/28/2021 Tumor Marker   Patient's tumor was tested for the following markers: CA-125. Results of the tumor marker test revealed 548.   03/20/2021 Imaging   1. Interval enlargement of multiple peritoneal soft tissue masses and nodules. 2. Interval enlargement of portacaval lymph nodes as well as slight interval enlargement of bilateral inguinal lymph nodes. 3. Findings are consistent with worsened nodal and peritoneal metastatic disease. 4. Status post hysterectomy.   Aortic Atherosclerosis (ICD10-I70.0).   03/27/2021 Echocardiogram   1. Left ventricular ejection fraction, by estimation, is 60 to 65%. The left ventricle has normal function. The left ventricle has no regional wall motion abnormalities. There is mild asymmetric left ventricular hypertrophy of the basal-septal segment. Left ventricular diastolic parameters were normal.  2. Right ventricular systolic function is normal. The right ventricular size is normal.  3. The mitral valve is normal in structure. Trivial mitral valve regurgitation. No evidence of mitral stenosis.  4. The aortic valve is tricuspid. Aortic valve regurgitation is moderate. No aortic stenosis is present.  5. The inferior vena cava is normal in size with greater than 50% respiratory variability, suggesting right  atrial pressure of 3 mmHg.   03/28/2021 -  Chemotherapy   Patient is on Treatment Plan : OVARIAN RECURRENT Liposomal Doxorubicin + Carboplatin q28d X 6 Cycles     03/28/2021 Tumor Marker   Patient's tumor was tested for the following markers: CA-125. Results of the tumor marker test revealed 991.   04/28/2021 Tumor Marker   Patient's tumor was tested for the following markers: CA-125. Results of the tumor marker test revealed 992.   06/16/2021 Imaging   Improving abdominopelvic lymphadenopathy, including a dominant 16 mm short axis right inguinal node, as above.   Improving peritoneal carcinomatosis, including a dominant 1.2 x 4.6 cm implant beneath the right mid abdominal wall.     PHYSICAL EXAMINATION: ECOG PERFORMANCE STATUS: 1 - Symptomatic but completely ambulatory  Vitals:   06/16/21 0753  BP: (!) 161/70  Pulse: 78  Resp: 18  Temp: 97.8 F (36.6 C)  SpO2: 97%   Filed  Weights   06/16/21 0753  Weight: 221 lb 6.4 oz (100.4 kg)    GENERAL:alert, no distress and comfortable NEURO: alert & oriented x 3 with fluent speech, no focal motor/sensory deficits  LABORATORY DATA:  I have reviewed the data as listed    Component Value Date/Time   NA 138 05/26/2021 0935   K 4.0 05/26/2021 0935   CL 104 05/26/2021 0935   CO2 25 05/26/2021 0935   GLUCOSE 127 (H) 05/26/2021 0935   BUN 16 05/26/2021 0935   CREATININE 0.93 05/26/2021 0935   CALCIUM 9.5 05/26/2021 0935   PROT 6.9 05/26/2021 0935   ALBUMIN 3.6 05/26/2021 0935   AST 20 05/26/2021 0935   ALT 21 05/26/2021 0935   ALKPHOS 90 05/26/2021 0935   BILITOT 0.3 05/26/2021 0935   GFRNONAA >60 05/26/2021 0935   GFRAA >60 03/25/2020 1144    No results found for: SPEP, UPEP  Lab Results  Component Value Date   WBC 3.0 (L) 05/26/2021   NEUTROABS 1.4 (L) 05/26/2021   HGB 10.4 (L) 05/26/2021   HCT 30.2 (L) 05/26/2021   MCV 103.1 (H) 05/26/2021   PLT 181 05/26/2021      Chemistry      Component Value Date/Time    NA 138 05/26/2021 0935   K 4.0 05/26/2021 0935   CL 104 05/26/2021 0935   CO2 25 05/26/2021 0935   BUN 16 05/26/2021 0935   CREATININE 0.93 05/26/2021 0935      Component Value Date/Time   CALCIUM 9.5 05/26/2021 0935   ALKPHOS 90 05/26/2021 0935   AST 20 05/26/2021 0935   ALT 21 05/26/2021 0935   BILITOT 0.3 05/26/2021 0935       RADIOGRAPHIC STUDIES: I have reviewed multiple imaging study with the patient and family I have personally reviewed the radiological images as listed and agreed with the findings in the report. CT ABDOMEN PELVIS W CONTRAST  Result Date: 06/15/2021 CLINICAL DATA:  Right ovarian cancer, status post TAH/BSO, chemotherapy complete EXAM: CT ABDOMEN AND PELVIS WITH CONTRAST TECHNIQUE: Multidetector CT imaging of the abdomen and pelvis was performed using the standard protocol following bolus administration of intravenous contrast. CONTRAST:  62m OMNIPAQUE IOHEXOL 350 MG/ML SOLN COMPARISON:  03/19/2021 FINDINGS: Lower chest: Lung bases are essentially clear. Hepatobiliary: Liver is within normal limits. Gallbladder is unremarkable. No intrahepatic or extrahepatic duct dilatation. Pancreas: Within normal limits. Spleen: Within normal limits. Adrenals/Urinary Tract: Adrenal glands are within normal limits. Kidneys are within normal limits.  No hydronephrosis. Bladder is within normal limits. Stomach/Bowel: Stomach is within normal limits. No evidence of bowel obstruction. Normal appendix (series 2/image 86). No colonic wall thickening or inflammatory changes. Vascular/Lymphatic: No evidence of abdominal aortic aneurysm. Atherosclerotic calcifications of the abdominal aorta and branch vessels. Prior portacaval node has essentially resolved. Additional 9 mm short axis retrocaval node is grossly unchanged (series 2/image 25). 16 mm short axis right inguinal node (series 2/image 74), previously 20 mm. Additional 11 mm short axis left inguinal node, previously 16 mm (series 2/image  72). Reproductive: Status post hysterectomy.  Vaginal pessary. Bilateral ovaries are within normal limits. Other: No abdominopelvic ascites. Scattered peritoneal nodularity, improved. Dominant 1.2 x 4.6 cm implant beneath the right mid abdominal wall (series 2/image 49), previously 1.9 x 6.0 cm. Additional 1.4 x 2.2 cm implant in the left lower pelvis adjacent to the rectosigmoid colon (series 2/image 68), previously 1.8 x 2.5 cm. Musculoskeletal: Degenerative changes of the visualized thoracolumbar spine. IMPRESSION: Improving abdominopelvic lymphadenopathy, including a  dominant 16 mm short axis right inguinal node, as above. Improving peritoneal carcinomatosis, including a dominant 1.2 x 4.6 cm implant beneath the right mid abdominal wall. Electronically Signed   By: Julian Hy M.D.   On: 06/15/2021 14:56

## 2021-06-16 NOTE — Telephone Encounter (Signed)
Called and given below message. She verbalized understanding. She is aware of appt for echo date.

## 2021-06-16 NOTE — Assessment & Plan Note (Signed)
She has mild stable pancytopenia She does not need transfusion support or G-CSF support

## 2021-06-16 NOTE — Assessment & Plan Note (Signed)
Her blood pressure is elevated today, likely due to anxiety We will proceed with treatment next week without delay

## 2021-06-16 NOTE — Telephone Encounter (Signed)
Called and left a message asking her to call the office back. 

## 2021-06-16 NOTE — Telephone Encounter (Signed)
-----   Message from [name], MD sent at 06/16/2021  9:17 AM EST ----- Regarding: ECHO I amended my notes Tell her due to the holidays, we will proceed with Rx next week and echo after Jan

## 2021-06-16 NOTE — Assessment & Plan Note (Addendum)
I have reviewed multiple imaging studies with the patient and family Overall, she tolerated treatment well except for mild fatigue My recommendation would be to proceed with treatment as scheduled next week and to repeat imaging study again in March Due to holidays, I think it is acceptable to order echocardiogram after her next treatment

## 2021-06-16 NOTE — Assessment & Plan Note (Signed)
She had minor recurrent skin infection near her buttock region likely due to an infected boil I recommend a course of antibiotics We will not delay her treatment

## 2021-06-16 NOTE — Telephone Encounter (Signed)
Called and left another message asking her to call the office back.  

## 2021-06-24 ENCOUNTER — Telehealth: Payer: Self-pay | Admitting: *Deleted

## 2021-06-24 ENCOUNTER — Other Ambulatory Visit: Payer: Self-pay | Admitting: *Deleted

## 2021-06-24 ENCOUNTER — Other Ambulatory Visit: Payer: Self-pay

## 2021-06-24 ENCOUNTER — Inpatient Hospital Stay: Payer: Medicare Other

## 2021-06-24 VITALS — BP 133/67 | HR 61 | Temp 98.7°F | Resp 20

## 2021-06-24 DIAGNOSIS — I10 Essential (primary) hypertension: Secondary | ICD-10-CM | POA: Diagnosis not present

## 2021-06-24 DIAGNOSIS — T451X5A Adverse effect of antineoplastic and immunosuppressive drugs, initial encounter: Secondary | ICD-10-CM | POA: Diagnosis not present

## 2021-06-24 DIAGNOSIS — Z86718 Personal history of other venous thrombosis and embolism: Secondary | ICD-10-CM | POA: Diagnosis not present

## 2021-06-24 DIAGNOSIS — D61818 Other pancytopenia: Secondary | ICD-10-CM | POA: Diagnosis not present

## 2021-06-24 DIAGNOSIS — Z79890 Hormone replacement therapy (postmenopausal): Secondary | ICD-10-CM

## 2021-06-24 DIAGNOSIS — E039 Hypothyroidism, unspecified: Secondary | ICD-10-CM | POA: Diagnosis present

## 2021-06-24 DIAGNOSIS — Z20822 Contact with and (suspected) exposure to covid-19: Secondary | ICD-10-CM | POA: Diagnosis not present

## 2021-06-24 DIAGNOSIS — Z4682 Encounter for fitting and adjustment of non-vascular catheter: Secondary | ICD-10-CM | POA: Diagnosis not present

## 2021-06-24 DIAGNOSIS — R109 Unspecified abdominal pain: Secondary | ICD-10-CM | POA: Diagnosis not present

## 2021-06-24 DIAGNOSIS — Z0189 Encounter for other specified special examinations: Secondary | ICD-10-CM | POA: Diagnosis not present

## 2021-06-24 DIAGNOSIS — Z66 Do not resuscitate: Secondary | ICD-10-CM | POA: Diagnosis not present

## 2021-06-24 DIAGNOSIS — K219 Gastro-esophageal reflux disease without esophagitis: Secondary | ICD-10-CM | POA: Diagnosis present

## 2021-06-24 DIAGNOSIS — Z800 Family history of malignant neoplasm of digestive organs: Secondary | ICD-10-CM | POA: Diagnosis not present

## 2021-06-24 DIAGNOSIS — Z7189 Other specified counseling: Secondary | ICD-10-CM

## 2021-06-24 DIAGNOSIS — C561 Malignant neoplasm of right ovary: Secondary | ICD-10-CM

## 2021-06-24 DIAGNOSIS — D701 Agranulocytosis secondary to cancer chemotherapy: Secondary | ICD-10-CM | POA: Diagnosis present

## 2021-06-24 DIAGNOSIS — C569 Malignant neoplasm of unspecified ovary: Secondary | ICD-10-CM | POA: Diagnosis present

## 2021-06-24 DIAGNOSIS — Z7901 Long term (current) use of anticoagulants: Secondary | ICD-10-CM

## 2021-06-24 DIAGNOSIS — K529 Noninfective gastroenteritis and colitis, unspecified: Secondary | ICD-10-CM | POA: Diagnosis not present

## 2021-06-24 DIAGNOSIS — Z8673 Personal history of transient ischemic attack (TIA), and cerebral infarction without residual deficits: Secondary | ICD-10-CM

## 2021-06-24 DIAGNOSIS — K5651 Intestinal adhesions [bands], with partial obstruction: Secondary | ICD-10-CM | POA: Diagnosis not present

## 2021-06-24 DIAGNOSIS — K56609 Unspecified intestinal obstruction, unspecified as to partial versus complete obstruction: Secondary | ICD-10-CM | POA: Diagnosis not present

## 2021-06-24 DIAGNOSIS — Z86711 Personal history of pulmonary embolism: Secondary | ICD-10-CM | POA: Diagnosis not present

## 2021-06-24 DIAGNOSIS — Z79899 Other long term (current) drug therapy: Secondary | ICD-10-CM

## 2021-06-24 DIAGNOSIS — K5669 Other partial intestinal obstruction: Secondary | ICD-10-CM | POA: Diagnosis not present

## 2021-06-24 DIAGNOSIS — K6389 Other specified diseases of intestine: Secondary | ICD-10-CM | POA: Diagnosis not present

## 2021-06-24 LAB — CMP (CANCER CENTER ONLY)
ALT: 9 U/L (ref 0–44)
AST: 11 U/L — ABNORMAL LOW (ref 15–41)
Albumin: 2.6 g/dL — ABNORMAL LOW (ref 3.5–5.0)
Alkaline Phosphatase: 54 U/L (ref 38–126)
Anion gap: 4 — ABNORMAL LOW (ref 5–15)
BUN: 14 mg/dL (ref 8–23)
CO2: 21 mmol/L — ABNORMAL LOW (ref 22–32)
Calcium: 6.4 mg/dL — CL (ref 8.9–10.3)
Chloride: 117 mmol/L — ABNORMAL HIGH (ref 98–111)
Creatinine: 0.56 mg/dL (ref 0.44–1.00)
GFR, Estimated: >60 mL/min (ref >60)
Glucose, Bld: 97 mg/dL (ref 70–99)
Potassium: 2.8 mmol/L — ABNORMAL LOW (ref 3.5–5.1)
Sodium: 142 mmol/L (ref 135–145)
Total Bilirubin: 0.2 mg/dL — ABNORMAL LOW (ref 0.3–1.2)
Total Protein: 4.4 g/dL — ABNORMAL LOW (ref 6.5–8.1)

## 2021-06-24 LAB — CBC WITH DIFFERENTIAL (CANCER CENTER ONLY)
Abs Immature Granulocytes: 0 10*3/uL (ref 0.00–0.07)
Basophils Absolute: 0 10*3/uL (ref 0.0–0.1)
Basophils Relative: 0 %
Eosinophils Absolute: 0 10*3/uL (ref 0.0–0.5)
Eosinophils Relative: 0 %
HCT: 29.5 % — ABNORMAL LOW (ref 36.0–46.0)
Hemoglobin: 9.9 g/dL — ABNORMAL LOW (ref 12.0–15.0)
Immature Granulocytes: 0 %
Lymphocytes Relative: 38 %
Lymphs Abs: 1.2 10*3/uL (ref 0.7–4.0)
MCH: 35.7 pg — ABNORMAL HIGH (ref 26.0–34.0)
MCHC: 33.6 g/dL (ref 30.0–36.0)
MCV: 106.5 fL — ABNORMAL HIGH (ref 80.0–100.0)
Monocytes Absolute: 0.4 10*3/uL (ref 0.1–1.0)
Monocytes Relative: 14 %
Neutro Abs: 1.5 10*3/uL — ABNORMAL LOW (ref 1.7–7.7)
Neutrophils Relative %: 48 %
Platelet Count: 139 10*3/uL — ABNORMAL LOW (ref 150–400)
RBC: 2.77 MIL/uL — ABNORMAL LOW (ref 3.87–5.11)
RDW: 17.5 % — ABNORMAL HIGH (ref 11.5–15.5)
WBC Count: 3.1 10*3/uL — ABNORMAL LOW (ref 4.0–10.5)
nRBC: 0 % (ref 0.0–0.2)

## 2021-06-24 MED ORDER — DOXORUBICIN HCL LIPOSOMAL CHEMO INJECTION 2 MG/ML
28.0000 mg/m2 | Freq: Once | INTRAVENOUS | Status: AC
  Administered 2021-06-24: 60 mg via INTRAVENOUS
  Filled 2021-06-24: qty 30

## 2021-06-24 MED ORDER — SODIUM CHLORIDE 0.9% FLUSH: 10.0000 mL | Freq: Once | INTRAVENOUS | Status: DC

## 2021-06-24 MED ORDER — SODIUM CHLORIDE 0.9 % IV SOLN
150.0000 mg | Freq: Once | INTRAVENOUS | Status: AC
  Administered 2021-06-24: 150 mg via INTRAVENOUS
  Filled 2021-06-24: qty 150

## 2021-06-24 MED ORDER — SODIUM CHLORIDE 0.9% FLUSH
10.0000 mL | INTRAVENOUS | Status: DC | PRN
  Administered 2021-06-24: 10 mL

## 2021-06-24 MED ORDER — SODIUM CHLORIDE 0.9 % IV SOLN
10.0000 mg | Freq: Once | INTRAVENOUS | Status: AC
  Administered 2021-06-24: 10 mg via INTRAVENOUS
  Filled 2021-06-24: qty 10

## 2021-06-24 MED ORDER — PALONOSETRON HCL INJECTION 0.25 MG/5ML
0.2500 mg | Freq: Once | INTRAVENOUS | Status: AC
  Administered 2021-06-24: 0.25 mg via INTRAVENOUS
  Filled 2021-06-24: qty 5

## 2021-06-24 MED ORDER — SODIUM CHLORIDE 0.9 % IV SOLN
392.0000 mg | Freq: Once | INTRAVENOUS | Status: AC
  Administered 2021-06-24: 390 mg via INTRAVENOUS
  Filled 2021-06-24: qty 39

## 2021-06-24 MED ORDER — HEPARIN SOD (PORK) LOCK FLUSH 100 UNIT/ML IV SOLN
500.0000 [IU] | Freq: Once | INTRAVENOUS | Status: AC | PRN
  Administered 2021-06-24: 500 [IU]

## 2021-06-24 MED ORDER — DEXTROSE 5 % IV SOLN: Freq: Once | INTRAVENOUS | Status: AC

## 2021-06-24 MED ORDER — POTASSIUM CHLORIDE CRYS ER 20 MEQ PO TBCR
20.0000 meq | EXTENDED_RELEASE_TABLET | Freq: Every day | ORAL | 0 refills | Status: DC
Start: 2021-06-24 — End: 2021-07-01

## 2021-06-24 NOTE — Telephone Encounter (Signed)
Calcium  Date Value Ref Range Status  06/24/2021 6.4 (LL) 8.9 - 10.3 mg/dL Final    Comment:    CRITICAL RESULT CALLED TO, READ BACK BY AND VERIFIED WITH: Shelle Iron, RN AT [facility]    On-call provider Dr.Shadad notified.

## 2021-06-24 NOTE — Patient Instructions (Signed)
[facility]  Discharge Instructions: Thank you for choosing [facility] to provide your oncology and hematology care.   If you have a lab appointment with the Cancer Center, please go directly to the Cancer Center and check in at the registration area.   Wear comfortable clothing and clothing appropriate for easy access to any Portacath or PICC line.   We strive to give you quality time with your provider. You may need to reschedule your appointment if you arrive late (15 or more minutes).  Arriving late affects you and other patients whose appointments are after yours.  Also, if you miss three or more appointments without notifying the office, you may be dismissed from the clinic at the provider's discretion.      For prescription refill requests, have your pharmacy contact our office and allow 72 hours for refills to be completed.    Today you received the following chemotherapy and/or immunotherapy agents:  Doxil & Carboplatin    To help prevent nausea and vomiting after your treatment, we encourage you to take your nausea medication as directed.  BELOW ARE SYMPTOMS THAT SHOULD BE REPORTED IMMEDIATELY: . *FEVER GREATER THAN 100.4 F (38 C) OR HIGHER . *CHILLS OR SWEATING . *NAUSEA AND VOMITING THAT IS NOT CONTROLLED WITH YOUR NAUSEA MEDICATION . *UNUSUAL SHORTNESS OF BREATH . *UNUSUAL BRUISING OR BLEEDING . *URINARY PROBLEMS (pain or burning when urinating, or frequent urination) . *BOWEL PROBLEMS (unusual diarrhea, constipation, pain near the anus) . TENDERNESS IN MOUTH AND THROAT WITH OR WITHOUT PRESENCE OF ULCERS (sore throat, sores in mouth, or a toothache) . UNUSUAL RASH, SWELLING OR PAIN  . UNUSUAL VAGINAL DISCHARGE OR ITCHING   Items with * indicate a potential emergency and should be followed up as soon as possible or go to the Emergency Department if any problems should occur.  Please show the CHEMOTHERAPY ALERT CARD or  IMMUNOTHERAPY ALERT CARD at check-in to the Emergency Department and triage nurse.  Should you have questions after your visit or need to cancel or reschedule your appointment, please contact [facility]  Dept: [phone]  and follow the prompts.  Office hours are 8:00 a.m. to 4:30 p.m. Monday - Friday. Please note that voicemails left after 4:00 p.m. may not be returned until the following business day.  We are closed weekends and major holidays. You have access to a nurse at all times for urgent questions. Please call the main number to the clinic Dept: [phone] and follow the prompts.   For any non-urgent questions, you may also contact your provider using MyChart. We now offer e-Visits for anyone 18 and older to request care online for non-urgent symptoms. For details visit [email or web address].   Also download the MyChart app! Go to the app store, search "MyChart", open the app, select [facility], and log in with your MyChart username and password.  Due to Covid, a mask is required upon entering the hospital/clinic. If you do not have a mask, one will be given to you upon arrival. For doctor visits, patients may have 1 support person aged 18 or older with them. For treatment visits, patients cannot have anyone with them due to current Covid guidelines and our immunocompromised population.   

## 2021-06-24 NOTE — Progress Notes (Signed)
OK to treat per Dr Alen Blew but needs K+ replacement.  Will call in script per his orders.

## 2021-06-25 ENCOUNTER — Inpatient Hospital Stay (HOSPITAL_COMMUNITY)
Admission: EM | Admit: 2021-06-25 | Discharge: 2021-07-01 | DRG: 389 | Disposition: A | Discharge status: Home / Self Care | Payer: Medicare Other | Attending: Internal Medicine | Admitting: Internal Medicine

## 2021-06-25 ENCOUNTER — Telehealth: Payer: Self-pay

## 2021-06-25 ENCOUNTER — Other Ambulatory Visit: Payer: Self-pay

## 2021-06-25 ENCOUNTER — Encounter (HOSPITAL_COMMUNITY): Payer: Self-pay

## 2021-06-25 DIAGNOSIS — K529 Noninfective gastroenteritis and colitis, unspecified: Secondary | ICD-10-CM

## 2021-06-25 DIAGNOSIS — K56609 Unspecified intestinal obstruction, unspecified as to partial versus complete obstruction: Secondary | ICD-10-CM | POA: Diagnosis present

## 2021-06-25 DIAGNOSIS — Z0189 Encounter for other specified special examinations: Secondary | ICD-10-CM

## 2021-06-25 DIAGNOSIS — R112 Nausea with vomiting, unspecified: Secondary | ICD-10-CM

## 2021-06-25 HISTORY — DX: Malignant neoplasm of unspecified ovary: C56.9

## 2021-06-25 MED ORDER — LACTATED RINGERS IV BOLUS
1000.0000 mL | Freq: Once | INTRAVENOUS | Status: AC
  Administered 2021-06-26: 1000 mL via INTRAVENOUS

## 2021-06-25 MED ORDER — FENTANYL CITRATE PF 50 MCG/ML IJ SOSY
100.0000 ug | PREFILLED_SYRINGE | Freq: Once | INTRAMUSCULAR | Status: AC
  Administered 2021-06-26: 100 ug via INTRAVENOUS
  Filled 2021-06-25: qty 2

## 2021-06-25 MED ORDER — ONDANSETRON HCL 4 MG/2ML IJ SOLN
4.0000 mg | Freq: Once | INTRAMUSCULAR | Status: AC
  Administered 2021-06-26: 4 mg via INTRAVENOUS
  Filled 2021-06-25: qty 2

## 2021-06-25 NOTE — ED Triage Notes (Signed)
Pt reports generalized abdominal pain beginning today around 1630 after drinking a boost supplemental drink. Pt has hx of ovarian cancer and received chemotherapy treatment yesterday. Approximately 10 episodes of vomiting today.

## 2021-06-25 NOTE — ED Provider Notes (Signed)
[facility] DEPT Provider Note: Georgena Spurling, MD, FACEP  CSN: [id number] MRN: [id number] ARRIVAL: 06/25/21 at 2308 ROOM: [place]  Abdominal Pain and Vomiting   HISTORY OF PRESENT ILLNESS  06/25/21 11:39 PM Paula Peterson is a 80 y.o. female currently undergoing chemotherapy for ovarian cancer, last treatment yesterday.  She is here with nausea, vomiting and abdominal pain that began again today about 4:30 PM after drinking a Boost supplement.  The abdominal pain is diffuse but primarily in the epigastrium and left upper quadrant.  She cannot describe the pain as she has never had a similar pain in the past.  She denies diarrhea.  She tried eating some soup but that made the pain worse.  She estimates she has vomited 10 times.   Past Medical History:  Diagnosis Date   Back pain    DVT (deep venous thrombosis) ([place]) 02/02/2019   had DVT first that moved into lung   GERD (gastroesophageal reflux disease)    History of pulmonary embolus (PE) 02/02/2019   confirmed by CT chest- had DVT first that moved into lungs per patient   Hypertension    Ovarian cancer ([place])    Pessary maintenance    Stroke [facility])     Past Surgical History:  Procedure Laterality Date   ABDOMINAL HYSTERECTOMY     ovaries left   BACK SURGERY     DEBULKING Bilateral 06/06/2019   Procedure: RADICAL TUMOR DEBULKING;  Surgeon: Everitt Amber, MD;  Location: [facility] ORS;  Service: Gynecology;  Laterality: Bilateral;   FRACTURE SURGERY     IR IMAGING GUIDED PORT INSERTION  12/15/2018   OMENTECTOMY Bilateral 06/06/2019   Procedure: OMENTECTOMY;  Surgeon: Everitt Amber, MD;  Location: [facility] ORS;  Service: Gynecology;  Laterality: Bilateral;   SALPINGOOPHORECTOMY Bilateral 06/06/2019   Procedure: EXPLORATORY LAPAROTOMY,OPEN RIGHT SALPINGO OOPHORECTOMY;  Surgeon: Everitt Amber, MD;  Location: [facility] ORS;  Service: Gynecology;  Laterality: Bilateral;    Family History  Problem Relation Age of Onset   Stomach cancer  Sister        dx 26s-50s   Dementia Mother    Cancer Sister        unk type, hysterectomy, dx 56s    Social History   Tobacco Use   Smoking status: Never   Smokeless tobacco: Never  Vaping Use   Vaping Use: Never used  Substance Use Topics   Alcohol use: No   Drug use: No    Prior to Admission medications   Medication Sig Start Date End Date Taking? Authorizing Provider  acetaminophen (TYLENOL) 325 MG tablet Take 650 mg by mouth every 6 (six) hours as needed for moderate pain or headache.     [provider]  amoxicillin (AMOXIL) 500 MG capsule Take 500 mg by mouth in the morning and at bedtime. 10/10/20   [provider]  atenolol (TENORMIN) 25 MG tablet TAKE ONE (1) TABLET BY MOUTH EVERY DAY 05/19/21   [name], MD  cephALEXin (KEFLEX) 500 MG capsule Take 1 capsule (500 mg total) by mouth 3 (three) times daily. 06/16/21   [name], MD  cholecalciferol (VITAMIN D3) 25 MCG (1000 UNIT) tablet Take 1,000 Units by mouth daily.    [provider]  ELIQUIS 5 MG TABS tablet TAKE ONE TABLET BY MOUTH TWICE DAILY 05/02/21   [name], MD  levothyroxine (SYNTHROID) 75 MCG tablet Take 1 tablet (75 mcg total) by mouth daily before breakfast. 07/18/20   Roxan Hockey, MD  lidocaine-prilocaine (EMLA) cream Apply to affected area once Patient taking differently: Apply 1 application topically daily as needed (port access). 12/07/18   [name], MD  loratadine (CLARITIN) 10 MG tablet Take 10 mg by mouth daily.    [provider]  Melatonin 3 MG TABS Take by mouth.    [provider]  ondansetron (ZOFRAN) 8 MG tablet Take 8 mg by mouth every 8 (eight) hours as needed.    [provider]  ondansetron (ZOFRAN) 8 MG tablet Take 1 tablet (8 mg total) by mouth 2 (two) times daily as needed (Nausea or vomiting). 12/20/20   [name], MD  pantoprazole (PROTONIX) 40 MG tablet Take 1 tablet (40 mg total) by mouth daily. 07/18/20   Roxan Hockey, MD  potassium chloride SA (KLOR-CON M) 20 MEQ tablet Take 1 tablet (20 mEq total) by mouth daily. 06/24/21   Wyatt Portela, MD  prochlorperazine (COMPAZINE) 10 MG tablet Take 10 mg by mouth every 6 (six) hours as needed.    [provider]  prochlorperazine (COMPAZINE) 10 MG tablet Take 1 tablet (10 mg total) by mouth every 6 (six) hours as needed (Nausea or vomiting). 12/20/20   [name], MD  senna-docusate (SENOKOT-S) 8.6-50 MG tablet Take 2 tablets by mouth at bedtime. 07/17/20   Roxan Hockey, MD  vitamin B-12 1000 MCG tablet Take 1 tablet (1,000 mcg total) by mouth daily. 07/18/20   Roxan Hockey, MD    Allergies Patient has no known allergies.   REVIEW OF SYSTEMS  Negative except as noted here or in the History of Present Illness.   PHYSICAL EXAMINATION  Initial Vital Signs Blood pressure (!) 153/73, pulse 66, temperature 98.9 F (37.2 C), resp. rate 19, height 5\' 7"  (1.702 m), weight 97.5 kg, SpO2 97 %.  Examination General: Well-developed, well-nourished female in no acute distress; appearance consistent with age of record HENT: normocephalic; atraumatic Eyes: pupils equal, round and reactive to light; extraocular muscles intact; bilateral pseudophakia Neck: supple Heart: regular rate and rhythm Lungs: clear to auscultation bilaterally Abdomen: soft; nondistended; left upper quadrant and epigastric tenderness; bowel sounds present Extremities: No deformity; full range of motion; pulses normal Neurologic: Awake, alert and oriented; motor function intact in all extremities and symmetric; no facial droop Skin: Warm and dry Psychiatric: Flat affect   RESULTS  Summary of this visit's results, reviewed and interpreted by myself:   EKG Interpretation  Date/Time:    Ventricular Rate:    PR Interval:    QRS Duration:   QT Interval:    QTC Calculation:   R Axis:     Text Interpretation:         Laboratory Studies: Results for orders placed  or performed during the hospital encounter of 06/25/21 (from the past 24 hour(s))  Lipase, blood     Status: None   Collection Time: 06/25/21 11:34 PM  Result Value Ref Range   Lipase 21 11 - 51 U/L  Urinalysis, Routine w reflex microscopic Urine, Clean Catch     Status: Abnormal   Collection Time: 06/25/21 11:34 PM  Result Value Ref Range   Color, Urine YELLOW YELLOW   APPearance HAZY (A) CLEAR   Specific Gravity, Urine 1.020 1.005 - 1.030   pH 6.0 5.0 - 8.0   Glucose, UA NEGATIVE NEGATIVE mg/dL   Hgb urine dipstick NEGATIVE NEGATIVE   Bilirubin Urine NEGATIVE NEGATIVE   Ketones, ur NEGATIVE NEGATIVE mg/dL   Protein, ur NEGATIVE NEGATIVE mg/dL   Nitrite  NEGATIVE NEGATIVE   Leukocytes,Ua LARGE (A) NEGATIVE   RBC / HPF 0-5 0 - 5 RBC/hpf   WBC, UA >50 (H) 0 - 5 WBC/hpf   Bacteria, UA NONE SEEN NONE SEEN   Squamous Epithelial / LPF 0-5 0 - 5   Mucus PRESENT   Comprehensive metabolic panel     Status: Abnormal   Collection Time: 06/25/21 11:34 PM  Result Value Ref Range   Sodium 133 (L) 135 - 145 mmol/L   Potassium 4.0 3.5 - 5.1 mmol/L   Chloride 99 98 - 111 mmol/L   CO2 25 22 - 32 mmol/L   Glucose, Bld 132 (H) 70 - 99 mg/dL   BUN 26 (H) 8 - 23 mg/dL   Creatinine, Ser 0.56 0.44 - 1.00 mg/dL   Calcium 9.7 8.9 - 10.3 mg/dL   Total Protein 7.3 6.5 - 8.1 g/dL   Albumin 3.7 3.5 - 5.0 g/dL   AST 25 15 - 41 U/L   ALT 23 0 - 44 U/L   Alkaline Phosphatase 73 38 - 126 U/L   Total Bilirubin 0.8 0.3 - 1.2 mg/dL   GFR, Estimated >60 >60 mL/min   Anion gap 9 5 - 15  CBC with Differential     Status: Abnormal   Collection Time: 06/25/21 11:34 PM  Result Value Ref Range   WBC 7.6 4.0 - 10.5 K/uL   RBC 2.73 (L) 3.87 - 5.11 MIL/uL   Hemoglobin 10.1 (L) 12.0 - 15.0 g/dL   HCT 30.0 (L) 36.0 - 46.0 %   MCV 109.9 (H) 80.0 - 100.0 fL   MCH 37.0 (H) 26.0 - 34.0 pg   MCHC 33.7 30.0 - 36.0 g/dL   RDW 18.6 (H) 11.5 - 15.5 %   Platelets 159 150 - 400 K/uL   nRBC 0.3 (H) 0.0 - 0.2 %    Neutrophils Relative % 71 %   Neutro Abs 5.3 1.7 - 7.7 K/uL   Lymphocytes Relative 18 %   Lymphs Abs 1.4 0.7 - 4.0 K/uL   Monocytes Relative 11 %   Monocytes Absolute 0.9 0.1 - 1.0 K/uL   Eosinophils Relative 0 %   Eosinophils Absolute 0.0 0.0 - 0.5 K/uL   Basophils Relative 0 %   Basophils Absolute 0.0 0.0 - 0.1 K/uL   Immature Granulocytes 0 %   Abs Immature Granulocytes 0.03 0.00 - 0.07 K/uL   Imaging Studies: CT ABDOMEN PELVIS W CONTRAST  Result Date: 06/26/2021 CLINICAL DATA:  Abdominal pain. History of right ovarian cancer status post TAH/BSO and complete chemotherapy. EXAM: CT ABDOMEN AND PELVIS WITH CONTRAST TECHNIQUE: Multidetector CT imaging of the abdomen and pelvis was performed using the standard protocol following bolus administration of intravenous contrast. CONTRAST:  17mL OMNIPAQUE IOHEXOL 350 MG/ML SOLN COMPARISON:  CT abdomen pelvis dated 06/13/2021. FINDINGS: Lower chest: Bibasilar linear atelectasis/scarring. The visualized lung bases are otherwise clear. Coronary vascular calcifications. No intra-abdominal free air.  Small free fluid within the pelvis. Hepatobiliary: No focal liver abnormality is seen. No gallstones, gallbladder wall thickening, or biliary dilatation. Pancreas: Unremarkable. No pancreatic ductal dilatation or surrounding inflammatory changes. Spleen: Normal in size without focal abnormality. Adrenals/Urinary Tract: The adrenal glands are unremarkable. There is mild bilateral renal parenchyma atrophy. There is no hydronephrosis on either side. There is symmetric enhancement and excretion of contrast by both kidneys. Small bilateral parapelvic cysts. The visualized ureters appear unremarkable. The urinary bladder is collapsed. Stomach/Bowel: Multiple mildly dilated and fecalized small bowel loops in the lower abdomen  noted measuring up to 3 cm. There is thickened and inflamed appearance of distal small bowel within the pelvis. A transition is noted in the right  lower quadrant (58/2 and 66/4). Findings most consistent with developing small-bowel obstruction with possible associated enteritis. There is abutment of loops of small bowel to the anterior peritoneal wall consistent with adhesions. There is a stone at the base of the appendix. No inflammatory changes. Vascular/Lymphatic: Moderate aortoiliac atherosclerotic disease. The IVC is unremarkable. No portal venous gas. There is no adenopathy. Reproductive: Hysterectomy. No adnexal masses. A pessary is noted in the pelvis. Other: Midline vertical anterior pelvic wall incisional scar. Musculoskeletal: Osteopenia with degenerative changes of the spine. No acute osseous pathology. IMPRESSION: 1. Findings most consistent with developing small-bowel obstruction with transition in the right lower quadrant. Possible associated enteritis. 2. Aortic Atherosclerosis (ICD10-I70.0). Electronically Signed   By: Anner Crete M.D.   On: 06/26/2021 02:43    ED COURSE and MDM  Nursing notes, initial and subsequent vitals signs, including pulse oximetry, reviewed and interpreted by myself.  Vitals:   06/26/21 0000 06/26/21 0100 06/26/21 0115 06/26/21 0159  BP: (!) 130/58 138/64 138/62 124/70  Pulse: 62 60 60 60  Resp: 18 13 16 15   Temp:      SpO2: 99% 99% 99% 99%  Weight:      Height:       Medications  sodium chloride (PF) 0.9 % injection (has no administration in time range)  fentaNYL (SUBLIMAZE) injection 100 mcg (has no administration in time range)  promethazine (PHENERGAN) 12.5 mg in sodium chloride 0.9 % 50 mL IVPB (has no administration in time range)  lactated ringers bolus 1,000 mL (0 mLs Intravenous Stopped 06/26/21 0120)  ondansetron (ZOFRAN) injection 4 mg (4 mg Intravenous Given 06/26/21 0020)  fentaNYL (SUBLIMAZE) injection 100 mcg (100 mcg Intravenous Given 06/26/21 0021)  iohexol (OMNIPAQUE) 350 MG/ML injection 80 mL (80 mLs Intravenous Contrast Given 06/26/21 0222)   2:51 AM CT scan concerning  for impending small bowel obstruction.  Enteritis may be due to chemotherapy.  3:09 AM Dr. Velia Meyer to admit to hospitalist service.   PROCEDURES  Procedures   ED DIAGNOSES     ICD-10-CM   1. Small bowel obstruction ([place])  K56.609     2. Enteritis  K52.9          Rafal Archuleta, MD 06/26/21 [id number]

## 2021-06-25 NOTE — Telephone Encounter (Signed)
Called per Dr. Alvy Bimler, instructed to chew Tums twice a day and eat a potassium rich diet. She verbalized understanding.

## 2021-06-26 ENCOUNTER — Inpatient Hospital Stay (HOSPITAL_COMMUNITY): Payer: Medicare Other

## 2021-06-26 ENCOUNTER — Emergency Department (HOSPITAL_COMMUNITY): Payer: Medicare Other

## 2021-06-26 ENCOUNTER — Encounter (HOSPITAL_COMMUNITY): Payer: Self-pay

## 2021-06-26 DIAGNOSIS — Z86711 Personal history of pulmonary embolism: Secondary | ICD-10-CM | POA: Diagnosis not present

## 2021-06-26 DIAGNOSIS — C569 Malignant neoplasm of unspecified ovary: Secondary | ICD-10-CM | POA: Diagnosis present

## 2021-06-26 DIAGNOSIS — K5669 Other partial intestinal obstruction: Secondary | ICD-10-CM | POA: Diagnosis not present

## 2021-06-26 DIAGNOSIS — Z79890 Hormone replacement therapy (postmenopausal): Secondary | ICD-10-CM | POA: Diagnosis not present

## 2021-06-26 DIAGNOSIS — E039 Hypothyroidism, unspecified: Secondary | ICD-10-CM | POA: Diagnosis present

## 2021-06-26 DIAGNOSIS — D701 Agranulocytosis secondary to cancer chemotherapy: Secondary | ICD-10-CM | POA: Diagnosis present

## 2021-06-26 DIAGNOSIS — Z79899 Other long term (current) drug therapy: Secondary | ICD-10-CM | POA: Diagnosis not present

## 2021-06-26 DIAGNOSIS — K56609 Unspecified intestinal obstruction, unspecified as to partial versus complete obstruction: Secondary | ICD-10-CM | POA: Diagnosis not present

## 2021-06-26 DIAGNOSIS — K5651 Intestinal adhesions [bands], with partial obstruction: Secondary | ICD-10-CM | POA: Diagnosis present

## 2021-06-26 DIAGNOSIS — Z8673 Personal history of transient ischemic attack (TIA), and cerebral infarction without residual deficits: Secondary | ICD-10-CM | POA: Diagnosis not present

## 2021-06-26 DIAGNOSIS — Z7901 Long term (current) use of anticoagulants: Secondary | ICD-10-CM | POA: Diagnosis not present

## 2021-06-26 DIAGNOSIS — Z4682 Encounter for fitting and adjustment of non-vascular catheter: Secondary | ICD-10-CM | POA: Diagnosis not present

## 2021-06-26 DIAGNOSIS — K529 Noninfective gastroenteritis and colitis, unspecified: Secondary | ICD-10-CM | POA: Diagnosis not present

## 2021-06-26 DIAGNOSIS — Z86718 Personal history of other venous thrombosis and embolism: Secondary | ICD-10-CM | POA: Diagnosis not present

## 2021-06-26 DIAGNOSIS — D61818 Other pancytopenia: Secondary | ICD-10-CM | POA: Diagnosis not present

## 2021-06-26 DIAGNOSIS — T451X5A Adverse effect of antineoplastic and immunosuppressive drugs, initial encounter: Secondary | ICD-10-CM | POA: Diagnosis present

## 2021-06-26 DIAGNOSIS — Z66 Do not resuscitate: Secondary | ICD-10-CM | POA: Diagnosis present

## 2021-06-26 DIAGNOSIS — Z800 Family history of malignant neoplasm of digestive organs: Secondary | ICD-10-CM | POA: Diagnosis not present

## 2021-06-26 DIAGNOSIS — K219 Gastro-esophageal reflux disease without esophagitis: Secondary | ICD-10-CM | POA: Diagnosis present

## 2021-06-26 DIAGNOSIS — Z0189 Encounter for other specified special examinations: Secondary | ICD-10-CM | POA: Diagnosis not present

## 2021-06-26 DIAGNOSIS — Z20822 Contact with and (suspected) exposure to covid-19: Secondary | ICD-10-CM | POA: Diagnosis present

## 2021-06-26 DIAGNOSIS — I10 Essential (primary) hypertension: Secondary | ICD-10-CM | POA: Diagnosis present

## 2021-06-26 DIAGNOSIS — K6389 Other specified diseases of intestine: Secondary | ICD-10-CM | POA: Diagnosis not present

## 2021-06-26 DIAGNOSIS — R109 Unspecified abdominal pain: Secondary | ICD-10-CM | POA: Diagnosis not present

## 2021-06-26 LAB — RESP PANEL BY RT-PCR (FLU A&B, COVID) ARPGX2
Influenza A by PCR: NEGATIVE
Influenza B by PCR: NEGATIVE
SARS Coronavirus 2 by RT PCR: NEGATIVE

## 2021-06-26 LAB — URINALYSIS, ROUTINE W REFLEX MICROSCOPIC
Bacteria, UA: NONE SEEN
Bilirubin Urine: NEGATIVE
Glucose, UA: NEGATIVE mg/dL
Hgb urine dipstick: NEGATIVE
Ketones, ur: NEGATIVE mg/dL
Nitrite: NEGATIVE
Protein, ur: NEGATIVE mg/dL
Specific Gravity, Urine: 1.02 (ref 1.005–1.030)
WBC, UA: >50 WBC/hpf — ABNORMAL HIGH (ref 0–5)
pH: 6 (ref 5.0–8.0)

## 2021-06-26 LAB — CBC WITH DIFFERENTIAL/PLATELET
Abs Immature Granulocytes: 0.03 10*3/uL (ref 0.00–0.07)
Basophils Absolute: 0 10*3/uL (ref 0.0–0.1)
Basophils Relative: 0 %
Eosinophils Absolute: 0 10*3/uL (ref 0.0–0.5)
Eosinophils Relative: 0 %
HCT: 30 % — ABNORMAL LOW (ref 36.0–46.0)
Hemoglobin: 10.1 g/dL — ABNORMAL LOW (ref 12.0–15.0)
Immature Granulocytes: 0 %
Lymphocytes Relative: 18 %
Lymphs Abs: 1.4 10*3/uL (ref 0.7–4.0)
MCH: 37 pg — ABNORMAL HIGH (ref 26.0–34.0)
MCHC: 33.7 g/dL (ref 30.0–36.0)
MCV: 109.9 fL — ABNORMAL HIGH (ref 80.0–100.0)
Monocytes Absolute: 0.9 10*3/uL (ref 0.1–1.0)
Monocytes Relative: 11 %
Neutro Abs: 5.3 10*3/uL (ref 1.7–7.7)
Neutrophils Relative %: 71 %
Platelets: 159 10*3/uL (ref 150–400)
RBC: 2.73 MIL/uL — ABNORMAL LOW (ref 3.87–5.11)
RDW: 18.6 % — ABNORMAL HIGH (ref 11.5–15.5)
WBC: 7.6 10*3/uL (ref 4.0–10.5)
nRBC: 0.3 % — ABNORMAL HIGH (ref 0.0–0.2)

## 2021-06-26 LAB — COMPREHENSIVE METABOLIC PANEL
ALT: 23 U/L (ref 0–44)
ALT: 21 U/L (ref 0–44)
AST: 25 U/L (ref 15–41)
AST: 22 U/L (ref 15–41)
Albumin: 3.7 g/dL (ref 3.5–5.0)
Albumin: 3.5 g/dL (ref 3.5–5.0)
Alkaline Phosphatase: 73 U/L (ref 38–126)
Alkaline Phosphatase: 70 U/L (ref 38–126)
Anion gap: 9 (ref 5–15)
Anion gap: 8 (ref 5–15)
BUN: 26 mg/dL — ABNORMAL HIGH (ref 8–23)
BUN: 23 mg/dL (ref 8–23)
CO2: 25 mmol/L (ref 22–32)
CO2: 26 mmol/L (ref 22–32)
Calcium: 9.7 mg/dL (ref 8.9–10.3)
Calcium: 9.4 mg/dL (ref 8.9–10.3)
Chloride: 99 mmol/L (ref 98–111)
Chloride: 98 mmol/L (ref 98–111)
Creatinine, Ser: 0.56 mg/dL (ref 0.44–1.00)
Creatinine, Ser: 0.6 mg/dL (ref 0.44–1.00)
GFR, Estimated: >60 mL/min (ref >60)
GFR, Estimated: >60 mL/min (ref >60)
Glucose, Bld: 132 mg/dL — ABNORMAL HIGH (ref 70–99)
Glucose, Bld: 121 mg/dL — ABNORMAL HIGH (ref 70–99)
Potassium: 4 mmol/L (ref 3.5–5.1)
Potassium: 3.8 mmol/L (ref 3.5–5.1)
Sodium: 133 mmol/L — ABNORMAL LOW (ref 135–145)
Sodium: 132 mmol/L — ABNORMAL LOW (ref 135–145)
Total Bilirubin: 0.8 mg/dL (ref 0.3–1.2)
Total Bilirubin: 0.8 mg/dL (ref 0.3–1.2)
Total Protein: 7.3 g/dL (ref 6.5–8.1)
Total Protein: 6.7 g/dL (ref 6.5–8.1)

## 2021-06-26 LAB — HEPARIN LEVEL (UNFRACTIONATED)
Heparin Unfractionated: >1.1 IU/mL — ABNORMAL HIGH (ref 0.30–0.70)
Heparin Unfractionated: 1.1 IU/mL — ABNORMAL HIGH (ref 0.30–0.70)

## 2021-06-26 LAB — CBC
HCT: 29.7 % — ABNORMAL LOW (ref 36.0–46.0)
Hemoglobin: 10.1 g/dL — ABNORMAL LOW (ref 12.0–15.0)
MCH: 37.1 pg — ABNORMAL HIGH (ref 26.0–34.0)
MCHC: 34 g/dL (ref 30.0–36.0)
MCV: 109.2 fL — ABNORMAL HIGH (ref 80.0–100.0)
Platelets: 165 10*3/uL (ref 150–400)
RBC: 2.72 MIL/uL — ABNORMAL LOW (ref 3.87–5.11)
RDW: 18.7 % — ABNORMAL HIGH (ref 11.5–15.5)
WBC: 5 10*3/uL (ref 4.0–10.5)
nRBC: 0 % (ref 0.0–0.2)

## 2021-06-26 LAB — APTT
aPTT: 26 seconds (ref 24–36)
aPTT: 53 seconds — ABNORMAL HIGH (ref 24–36)

## 2021-06-26 LAB — MAGNESIUM: Magnesium: 1.5 mg/dL — ABNORMAL LOW (ref 1.7–2.4)

## 2021-06-26 LAB — LIPASE, BLOOD: Lipase: 21 U/L (ref 11–51)

## 2021-06-26 MED ORDER — HEPARIN (PORCINE) 25000 UT/250ML-% IV SOLN
1300.0000 [IU]/h | INTRAVENOUS | Status: DC
  Administered 2021-06-27 – 2021-06-28 (×2): 1400 [IU]/h via INTRAVENOUS
  Administered 2021-06-28 – 2021-06-29 (×2): 1300 [IU]/h via INTRAVENOUS
  Filled 2021-06-26 (×6): qty 250

## 2021-06-26 MED ORDER — ACETAMINOPHEN 650 MG RE SUPP: 650.0000 mg | Freq: Four times a day (QID) | RECTAL | Status: DC | PRN

## 2021-06-26 MED ORDER — SODIUM CHLORIDE 0.9 % IV SOLN
8.0000 mg | Freq: Four times a day (QID) | INTRAVENOUS | Status: DC | PRN
  Filled 2021-06-26: qty 4

## 2021-06-26 MED ORDER — METHOCARBAMOL 1000 MG/10ML IJ SOLN
1000.0000 mg | Freq: Four times a day (QID) | INTRAVENOUS | Status: DC | PRN
  Administered 2021-06-26 – 2021-06-28 (×3): 1000 mg via INTRAVENOUS
  Filled 2021-06-26: qty 10
  Filled 2021-06-26 (×2): qty 1000
  Filled 2021-06-26: qty 10

## 2021-06-26 MED ORDER — SODIUM CHLORIDE 0.9 % IV SOLN
12.5000 mg | Freq: Once | INTRAVENOUS | Status: AC
  Administered 2021-06-26: 04:00:00 12.5 mg via INTRAVENOUS
  Filled 2021-06-26: qty 12.5

## 2021-06-26 MED ORDER — LIP MEDEX EX OINT
TOPICAL_OINTMENT | CUTANEOUS | Status: AC
  Filled 2021-06-26: qty 7

## 2021-06-26 MED ORDER — MAGIC MOUTHWASH
15.0000 mL | Freq: Four times a day (QID) | ORAL | Status: DC | PRN
  Filled 2021-06-26: qty 15

## 2021-06-26 MED ORDER — MENTHOL 3 MG MT LOZG: 1.0000 | LOZENGE | OROMUCOSAL | Status: DC | PRN

## 2021-06-26 MED ORDER — DIATRIZOATE MEGLUMINE & SODIUM 66-10 % PO SOLN
90.0000 mL | Freq: Once | ORAL | Status: AC
  Administered 2021-06-26: 14:00:00 90 mL via NASOGASTRIC
  Filled 2021-06-26: qty 90

## 2021-06-26 MED ORDER — HEPARIN (PORCINE) 25000 UT/250ML-% IV SOLN
1250.0000 [IU]/h | INTRAVENOUS | Status: DC
  Administered 2021-06-26: 12:00:00 1250 [IU]/h via INTRAVENOUS
  Filled 2021-06-26: qty 250

## 2021-06-26 MED ORDER — FENTANYL CITRATE PF 50 MCG/ML IJ SOSY: 25.0000 ug | PREFILLED_SYRINGE | Freq: Once | INTRAMUSCULAR | Status: DC

## 2021-06-26 MED ORDER — LACTATED RINGERS IV BOLUS: 1000.0000 mL | Freq: Three times a day (TID) | INTRAVENOUS | Status: AC | PRN

## 2021-06-26 MED ORDER — LIP MEDEX EX OINT
1.0000 "application " | TOPICAL_OINTMENT | Freq: Two times a day (BID) | CUTANEOUS | Status: DC
  Administered 2021-06-26 – 2021-07-01 (×10): 1 via TOPICAL
  Filled 2021-06-26 (×2): qty 7

## 2021-06-26 MED ORDER — MORPHINE SULFATE (PF) 4 MG/ML IV SOLN
6.0000 mg | INTRAVENOUS | Status: DC | PRN
  Administered 2021-06-26 – 2021-06-28 (×7): 6 mg via INTRAVENOUS
  Filled 2021-06-26 (×7): qty 2

## 2021-06-26 MED ORDER — LACTATED RINGERS IV SOLN: INTRAVENOUS | Status: DC

## 2021-06-26 MED ORDER — PHENOL 1.4 % MT LIQD
2.0000 | OROMUCOSAL | Status: DC | PRN
  Administered 2021-06-27 – 2021-06-28 (×2): 2 via OROMUCOSAL
  Filled 2021-06-26: qty 177

## 2021-06-26 MED ORDER — ONDANSETRON HCL 4 MG/2ML IJ SOLN
4.0000 mg | Freq: Four times a day (QID) | INTRAMUSCULAR | Status: DC | PRN
  Administered 2021-06-26: 06:00:00 4 mg via INTRAVENOUS
  Filled 2021-06-26: qty 2

## 2021-06-26 MED ORDER — IOHEXOL 350 MG/ML SOLN
80.0000 mL | Freq: Once | INTRAVENOUS | Status: AC | PRN
  Administered 2021-06-26: 02:00:00 80 mL via INTRAVENOUS

## 2021-06-26 MED ORDER — SODIUM CHLORIDE (PF) 0.9 % IJ SOLN
INTRAMUSCULAR | Status: AC
  Filled 2021-06-26: qty 50

## 2021-06-26 MED ORDER — FENTANYL CITRATE PF 50 MCG/ML IJ SOSY
50.0000 ug | PREFILLED_SYRINGE | INTRAMUSCULAR | Status: DC | PRN
  Administered 2021-06-26: 09:00:00 50 ug via INTRAVENOUS
  Filled 2021-06-26: qty 1

## 2021-06-26 MED ORDER — HYDRALAZINE HCL 20 MG/ML IJ SOLN: 10.0000 mg | Freq: Three times a day (TID) | INTRAMUSCULAR | Status: DC | PRN

## 2021-06-26 MED ORDER — FENTANYL CITRATE PF 50 MCG/ML IJ SOSY
100.0000 ug | PREFILLED_SYRINGE | Freq: Once | INTRAMUSCULAR | Status: AC
  Administered 2021-06-26: 04:00:00 100 ug via INTRAVENOUS
  Filled 2021-06-26: qty 2

## 2021-06-26 MED ORDER — DIPHENHYDRAMINE HCL 50 MG/ML IJ SOLN
12.5000 mg | Freq: Four times a day (QID) | INTRAMUSCULAR | Status: DC | PRN
  Administered 2021-06-26: 23:00:00 25 mg via INTRAVENOUS
  Administered 2021-06-29: 12.5 mg via INTRAVENOUS
  Filled 2021-06-26 (×2): qty 1

## 2021-06-26 MED ORDER — PROCHLORPERAZINE EDISYLATE 10 MG/2ML IJ SOLN
5.0000 mg | INTRAMUSCULAR | Status: DC | PRN
Start: 2021-06-26 — End: 2021-07-01

## 2021-06-26 MED ORDER — NALOXONE HCL 0.4 MG/ML IJ SOLN: 0.4000 mg | INTRAMUSCULAR | Status: DC | PRN

## 2021-06-26 MED ORDER — ONDANSETRON HCL 4 MG/2ML IJ SOLN
4.0000 mg | Freq: Four times a day (QID) | INTRAMUSCULAR | Status: DC | PRN
  Administered 2021-06-26 – 2021-06-27 (×2): 4 mg via INTRAVENOUS
  Filled 2021-06-26 (×2): qty 2

## 2021-06-26 MED ORDER — LACTATED RINGERS IV BOLUS
1000.0000 mL | Freq: Once | INTRAVENOUS | Status: AC
  Administered 2021-06-26: 09:00:00 1000 mL via INTRAVENOUS

## 2021-06-26 MED ORDER — BISACODYL 10 MG RE SUPP: 10.0000 mg | Freq: Two times a day (BID) | RECTAL | Status: DC | PRN

## 2021-06-26 MED ORDER — MORPHINE SULFATE (PF) 2 MG/ML IV SOLN: 2.0000 mg | INTRAVENOUS | Status: DC | PRN

## 2021-06-26 MED ORDER — ACETAMINOPHEN 325 MG PO TABS
650.0000 mg | ORAL_TABLET | Freq: Four times a day (QID) | ORAL | Status: DC | PRN
  Administered 2021-06-28: 650 mg via ORAL
  Filled 2021-06-26: qty 2

## 2021-06-26 MED ORDER — FENTANYL CITRATE PF 50 MCG/ML IJ SOSY
25.0000 ug | PREFILLED_SYRINGE | INTRAMUSCULAR | Status: DC | PRN
  Administered 2021-06-26: 06:00:00 25 ug via INTRAVENOUS
  Filled 2021-06-26: qty 1

## 2021-06-26 MED ORDER — ALUM & MAG HYDROXIDE-SIMETH 200-200-20 MG/5ML PO SUSP: 30.0000 mL | Freq: Four times a day (QID) | ORAL | Status: DC | PRN

## 2021-06-26 NOTE — Progress Notes (Signed)
Paula Peterson   DOB:02/09/1941   QV#:[id number]    ASSESSMENT & PLAN:  Recurrent ovarian cancer Her most recent CT imaging done 2 weeks ago show positive response to treatment Continue supportive care  Small bowel obstruction I have personally reviewed the imaging study Most likely this is adhesions related but malignancy causing bowel obstruction cannot be excluded although she has good response to treatment recently Agree with conservative approach with NG tube placement, n.p.o. and supportive care  Abdominal pain Due to small bowel obstruction I adjusted her morphine sulfate IV doses as needed  Mild anemia Due to recent chemotherapy Observe  History of recurrent PE On anticoagulation therapy, transition to heparin due to n.p.o. status  Code Status DNR  Goals of care Resolution of bowel obstruction  Discharge planning She will likely be here for the next 3 to 5 days I will continue to follow  All questions were answered. The patient knows to call the clinic with any problems, questions or concerns.   The total time spent in the appointment was 40 minutes encounter with patients including review of chart and various tests results, discussions about plan of care and coordination of care plan  [name], MD 06/26/2021 10:56 AM  Subjective:  Patient is well-known, last seen approximately 2 weeks ago Family by the bedside She had acute onset of abdominal pain, nausea and vomiting that happened abruptly prior to admission She is complaining of severe abdominal pain despite being put on fentanyl.  Awaiting morphine sulfate pain medicine.  NG tube has been placed She had some mild nausea Denies constipation recently  Objective:  Vitals:   06/26/21 0615 06/26/21 1037  BP: 127/66 134/61  Pulse: 60 64  Resp: 18 16  Temp: (!) 97.2 F (36.2 C) 98.5 F (36.9 C)  SpO2:  95%     Intake/Output Summary (Last 24 hours) at 06/26/2021 1056 Last data filed at 06/26/2021  0643 Gross per 24 hour  Intake 186.32 ml  Output --  Net 186.32 ml    GENERAL:alert in distress from pain SKIN: skin color, texture, turgor are normal, no rashes or significant lesions EYES: normal, Conjunctiva are pink and non-injected, sclera clear OROPHARYNX:no exudate, no erythema and lips, buccal mucosa, and tongue normal  NECK: supple, thyroid normal size, non-tender, without nodularity LYMPH:  no palpable lymphadenopathy in the cervical, axillary or inguinal LUNGS: clear to auscultation and percussion with normal breathing effort HEART: regular rate & rhythm and no murmurs and no lower extremity edema ABDOMEN:abdomen soft,, distended Musculoskeletal:no cyanosis of digits and no clubbing  NEURO: alert & oriented x 3 with fluent speech, no focal motor/sensory deficits   Labs:  Recent Labs    06/24/21 0817 06/25/21 2334 06/26/21 0700  NA 142 133* 132*  K 2.8* 4.0 3.8  CL 117* 99 98  CO2 21* 25 26  GLUCOSE 97 132* 121*  BUN 14 26* 23  CREATININE 0.56 0.56 0.60  CALCIUM 6.4* 9.7 9.4  GFRNONAA >60 >60 >60  PROT 4.4* 7.3 6.7  ALBUMIN 2.6* 3.7 3.5  AST 11* 25 22  ALT 9 23 21   ALKPHOS 54 73 70  BILITOT 0.2* 0.8 0.8    Studies: I have personally reviewed her latest CT imaging CT ABDOMEN PELVIS W CONTRAST  Result Date: 06/26/2021 CLINICAL DATA:  Abdominal pain. History of right ovarian cancer status post TAH/BSO and complete chemotherapy. EXAM: CT ABDOMEN AND PELVIS WITH CONTRAST TECHNIQUE: Multidetector CT imaging of the abdomen and pelvis was performed using the  standard protocol following bolus administration of intravenous contrast. CONTRAST:  79mL OMNIPAQUE IOHEXOL 350 MG/ML SOLN COMPARISON:  CT abdomen pelvis dated 06/13/2021. FINDINGS: Lower chest: Bibasilar linear atelectasis/scarring. The visualized lung bases are otherwise clear. Coronary vascular calcifications. No intra-abdominal free air.  Small free fluid within the pelvis. Hepatobiliary: No focal liver  abnormality is seen. No gallstones, gallbladder wall thickening, or biliary dilatation. Pancreas: Unremarkable. No pancreatic ductal dilatation or surrounding inflammatory changes. Spleen: Normal in size without focal abnormality. Adrenals/Urinary Tract: The adrenal glands are unremarkable. There is mild bilateral renal parenchyma atrophy. There is no hydronephrosis on either side. There is symmetric enhancement and excretion of contrast by both kidneys. Small bilateral parapelvic cysts. The visualized ureters appear unremarkable. The urinary bladder is collapsed. Stomach/Bowel: Multiple mildly dilated and fecalized small bowel loops in the lower abdomen noted measuring up to 3 cm. There is thickened and inflamed appearance of distal small bowel within the pelvis. A transition is noted in the right lower quadrant (58/2 and 66/4). Findings most consistent with developing small-bowel obstruction with possible associated enteritis. There is abutment of loops of small bowel to the anterior peritoneal wall consistent with adhesions. There is a stone at the base of the appendix. No inflammatory changes. Vascular/Lymphatic: Moderate aortoiliac atherosclerotic disease. The IVC is unremarkable. No portal venous gas. There is no adenopathy. Reproductive: Hysterectomy. No adnexal masses. A pessary is noted in the pelvis. Other: Midline vertical anterior pelvic wall incisional scar. Musculoskeletal: Osteopenia with degenerative changes of the spine. No acute osseous pathology. IMPRESSION: 1. Findings most consistent with developing small-bowel obstruction with transition in the right lower quadrant. Possible associated enteritis. 2. Aortic Atherosclerosis (ICD10-I70.0). Electronically Signed   By: Anner Crete M.D.   On: 06/26/2021 02:43   CT ABDOMEN PELVIS W CONTRAST  Result Date: 06/15/2021 CLINICAL DATA:  Right ovarian cancer, status post TAH/BSO, chemotherapy complete EXAM: CT ABDOMEN AND PELVIS WITH CONTRAST  TECHNIQUE: Multidetector CT imaging of the abdomen and pelvis was performed using the standard protocol following bolus administration of intravenous contrast. CONTRAST:  39mL OMNIPAQUE IOHEXOL 350 MG/ML SOLN COMPARISON:  03/19/2021 FINDINGS: Lower chest: Lung bases are essentially clear. Hepatobiliary: Liver is within normal limits. Gallbladder is unremarkable. No intrahepatic or extrahepatic duct dilatation. Pancreas: Within normal limits. Spleen: Within normal limits. Adrenals/Urinary Tract: Adrenal glands are within normal limits. Kidneys are within normal limits.  No hydronephrosis. Bladder is within normal limits. Stomach/Bowel: Stomach is within normal limits. No evidence of bowel obstruction. Normal appendix (series 2/image 86). No colonic wall thickening or inflammatory changes. Vascular/Lymphatic: No evidence of abdominal aortic aneurysm. Atherosclerotic calcifications of the abdominal aorta and branch vessels. Prior portacaval node has essentially resolved. Additional 9 mm short axis retrocaval node is grossly unchanged (series 2/image 25). 16 mm short axis right inguinal node (series 2/image 74), previously 20 mm. Additional 11 mm short axis left inguinal node, previously 16 mm (series 2/image 72). Reproductive: Status post hysterectomy.  Vaginal pessary. Bilateral ovaries are within normal limits. Other: No abdominopelvic ascites. Scattered peritoneal nodularity, improved. Dominant 1.2 x 4.6 cm implant beneath the right mid abdominal wall (series 2/image 49), previously 1.9 x 6.0 cm. Additional 1.4 x 2.2 cm implant in the left lower pelvis adjacent to the rectosigmoid colon (series 2/image 68), previously 1.8 x 2.5 cm. Musculoskeletal: Degenerative changes of the visualized thoracolumbar spine. IMPRESSION: Improving abdominopelvic lymphadenopathy, including a dominant 16 mm short axis right inguinal node, as above. Improving peritoneal carcinomatosis, including a dominant 1.2 x 4.6 cm implant beneath  the right mid abdominal wall. Electronically Signed   By: Julian Hy M.D.   On: 06/15/2021 14:56

## 2021-06-26 NOTE — H&P (Signed)
History and Physical    JAKARA BLATTER QMG:[id number] DOB: [birth year] DOA: 06/25/2021  PCP: Caryl Bis, MD  Patient coming from: Home  Chief Complaint: abdominal pain, N/V  HPI: Paula Peterson is a 80 y.o. female with medical history significant of ovarian cancer, GERD, DVT on eliquis, hypothyroidism, HTN. Presenting with abdominal pain, N/V. She had a chemo session 2 days ago. Yesterday, she didn't feel well. She started having a sharp, global abdominal pain. It was constant and intense. She tried APAP, but it didn't help. She started having nausea and dry heaves. She did not have any diarrhea or fever. The pain and dry heaves continued through the evening. She became concerned and came to the ED for help. She denies any other aggravating or alleviating factors.     ED Course: CT showed early SBO. TRH was called for admission.   Review of Systems:  Denies CP, palpitations, dyspnea, F/D, sick contacts. Review of systems is otherwise negative for all not mentioned in HPI.   PMHx Past Medical History:  Diagnosis Date   Back pain    DVT (deep venous thrombosis) ([employer]) 02/02/2019   had DVT first that moved into lung   GERD (gastroesophageal reflux disease)    History of pulmonary embolus (PE) 02/02/2019   confirmed by CT chest- had DVT first that moved into lungs per patient   Hypertension    Ovarian cancer ([place])    Pessary maintenance    Stroke [facility])     PSHx Past Surgical History:  Procedure Laterality Date   ABDOMINAL HYSTERECTOMY     ovaries left   BACK SURGERY     DEBULKING Bilateral 06/06/2019   Procedure: RADICAL TUMOR DEBULKING;  Surgeon: Everitt Amber, MD;  Location: [facility];  Service: Gynecology;  Laterality: Bilateral;   FRACTURE SURGERY     IR IMAGING GUIDED PORT INSERTION  12/15/2018   OMENTECTOMY Bilateral 06/06/2019   Procedure: OMENTECTOMY;  Surgeon: Everitt Amber, MD;  Location: [facility];  Service: Gynecology;  Laterality: Bilateral;   SALPINGOOPHORECTOMY Bilateral  06/06/2019   Procedure: EXPLORATORY LAPAROTOMY,OPEN RIGHT SALPINGO OOPHORECTOMY;  Surgeon: Everitt Amber, MD;  Location: [facility];  Service: Gynecology;  Laterality: Bilateral;    SocHx  reports that she has never smoked. She has never used smokeless tobacco. She reports that she does not drink alcohol and does not use drugs.  No Known Allergies  FamHx Family History  Problem Relation Age of Onset   Stomach cancer Sister        dx 106s-50s   Dementia Mother    Cancer Sister        unk type, hysterectomy, dx 92s    Prior to Admission medications   Medication Sig Start Date End Date Taking? Authorizing Provider  acetaminophen (TYLENOL) 325 MG tablet Take 650 mg by mouth every 6 (six) hours as needed for moderate pain or headache.     [provider]  amoxicillin (AMOXIL) 500 MG capsule Take 500 mg by mouth in the morning and at bedtime. 10/10/20   [provider]  atenolol (TENORMIN) 25 MG tablet TAKE ONE (1) TABLET BY MOUTH EVERY DAY 05/19/21   [name], MD  cephALEXin (KEFLEX) 500 MG capsule Take 1 capsule (500 mg total) by mouth 3 (three) times daily. 06/16/21   [name], MD  cholecalciferol (VITAMIN D3) 25 MCG (1000 UNIT) tablet Take 1,000 Units by mouth daily.    [provider]  ELIQUIS 5 MG TABS tablet TAKE ONE TABLET BY  MOUTH TWICE DAILY 05/02/21   [name], MD  levothyroxine (SYNTHROID) 75 MCG tablet Take 1 tablet (75 mcg total) by mouth daily before breakfast. 07/18/20   Roxan Hockey, MD  lidocaine-prilocaine (EMLA) cream Apply to affected area once Patient taking differently: Apply 1 application topically daily as needed (port access). 12/07/18   [name], MD  loratadine (CLARITIN) 10 MG tablet Take 10 mg by mouth daily.    [provider]  Melatonin 3 MG TABS Take by mouth.    [provider]  ondansetron (ZOFRAN) 8 MG tablet Take 8 mg by mouth every 8 (eight) hours as needed.    [provider]  ondansetron  (ZOFRAN) 8 MG tablet Take 1 tablet (8 mg total) by mouth 2 (two) times daily as needed (Nausea or vomiting). 12/20/20   [name], MD  pantoprazole (PROTONIX) 40 MG tablet Take 1 tablet (40 mg total) by mouth daily. 07/18/20   Roxan Hockey, MD  potassium chloride SA (KLOR-CON M) 20 MEQ tablet Take 1 tablet (20 mEq total) by mouth daily. 06/24/21   Wyatt Portela, MD  prochlorperazine (COMPAZINE) 10 MG tablet Take 10 mg by mouth every 6 (six) hours as needed.    [provider]  prochlorperazine (COMPAZINE) 10 MG tablet Take 1 tablet (10 mg total) by mouth every 6 (six) hours as needed (Nausea or vomiting). 12/20/20   [name], MD  senna-docusate (SENOKOT-S) 8.6-50 MG tablet Take 2 tablets by mouth at bedtime. 07/17/20   Roxan Hockey, MD  vitamin B-12 1000 MCG tablet Take 1 tablet (1,000 mcg total) by mouth daily. 07/18/20   Roxan Hockey, MD    Physical Exam: Vitals:   06/26/21 0330 06/26/21 0400 06/26/21 0430 06/26/21 0615  BP: (!) 149/78 139/68 131/60 127/66  Pulse:  61 63 60  Resp: 17 16 16 18   Temp:   98.2 F (36.8 C) (!) 97.2 F (36.2 C)  TempSrc:   Oral Oral  SpO2:  94% 94%   Weight:      Height:        General: 80 y.o. female resting in bed in NAD Eyes: PERRL, normal sclera ENMT: Nares patent w/o discharge, orophaynx clear, dentition normal, ears w/o discharge/lesions/ulcers Neck: Supple, trachea midline Cardiovascular: RRR, +S1, S2, no m/g/r, equal pulses throughout Respiratory: CTABL, no w/r/r, normal WOB GI: BS hypoactive, ND, TTP LUQ/LLQ, no masses noted, no organomegaly noted MSK: No e/c/c Neuro: A&O x 3, no focal deficits Psyc: Appropriate interaction and affect, calm/cooperative  Labs on Admission: I have personally reviewed following labs and imaging studies  CBC: Recent Labs  Lab 06/24/21 0817 06/25/21 2334 06/26/21 0700  WBC 3.1* 7.6 5.0  NEUTROABS 1.5* 5.3  --   HGB 9.9* 10.1* 10.1*  HCT 29.5* 30.0* 29.7*  MCV 106.5* 109.9* 109.2*   PLT 139* 159 829   Basic Metabolic Panel: Recent Labs  Lab 06/24/21 0817 06/25/21 2334 06/26/21 0700  NA 142 133* 132*  K 2.8* 4.0 3.8  CL 117* 99 98  CO2 21* 25 26  GLUCOSE 97 132* 121*  BUN 14 26* 23  CREATININE 0.56 0.56 0.60  CALCIUM 6.4* 9.7 9.4  MG  --   --  1.5*   GFR: Estimated Creatinine Clearance: 69.5 mL/min (by C-G formula based on SCr of 0.6 mg/dL). Liver Function Tests: Recent Labs  Lab 06/24/21 0817 06/25/21 2334 06/26/21 0700  AST 11* 25 22  ALT 9 23 21   ALKPHOS 54 73 70  BILITOT 0.2* 0.8  0.8  PROT 4.4* 7.3 6.7  ALBUMIN 2.6* 3.7 3.5   Recent Labs  Lab 06/25/21 2334  LIPASE 21   No results for input(s): AMMONIA in the last 168 hours. Coagulation Profile: No results for input(s): INR, PROTIME in the last 168 hours. Cardiac Enzymes: No results for input(s): CKTOTAL, CKMB, CKMBINDEX, TROPONINI in the last 168 hours. BNP (last 3 results) No results for input(s): PROBNP in the last 8760 hours. HbA1C: No results for input(s): HGBA1C in the last 72 hours. CBG: No results for input(s): GLUCAP in the last 168 hours. Lipid Profile: No results for input(s): CHOL, HDL, LDLCALC, TRIG, CHOLHDL, LDLDIRECT in the last 72 hours. Thyroid Function Tests: No results for input(s): TSH, T4TOTAL, FREET4, T3FREE, THYROIDAB in the last 72 hours. Anemia Panel: No results for input(s): VITAMINB12, FOLATE, FERRITIN, TIBC, IRON, RETICCTPCT in the last 72 hours. Urine analysis:    Component Value Date/Time   COLORURINE YELLOW 06/25/2021 2334   APPEARANCEUR HAZY (A) 06/25/2021 2334   LABSPEC 1.020 06/25/2021 2334   PHURINE 6.0 06/25/2021 2334   GLUCOSEU NEGATIVE 06/25/2021 2334   HGBUR NEGATIVE 06/25/2021 2334   BILIRUBINUR NEGATIVE 06/25/2021 2334   [place] 06/25/2021 2334   PROTEINUR NEGATIVE 06/25/2021 2334   UROBILINOGEN 0.2 04/08/2013 2040   NITRITE NEGATIVE 06/25/2021 2334   LEUKOCYTESUR LARGE (A) 06/25/2021 2334    Radiological Exams on  Admission: CT ABDOMEN PELVIS W CONTRAST  Result Date: 06/26/2021 CLINICAL DATA:  Abdominal pain. History of right ovarian cancer status post TAH/BSO and complete chemotherapy. EXAM: CT ABDOMEN AND PELVIS WITH CONTRAST TECHNIQUE: Multidetector CT imaging of the abdomen and pelvis was performed using the standard protocol following bolus administration of intravenous contrast. CONTRAST:  9mL OMNIPAQUE IOHEXOL 350 MG/ML SOLN COMPARISON:  CT abdomen pelvis dated 06/13/2021. FINDINGS: Lower chest: Bibasilar linear atelectasis/scarring. The visualized lung bases are otherwise clear. Coronary vascular calcifications. No intra-abdominal free air.  Small free fluid within the pelvis. Hepatobiliary: No focal liver abnormality is seen. No gallstones, gallbladder wall thickening, or biliary dilatation. Pancreas: Unremarkable. No pancreatic ductal dilatation or surrounding inflammatory changes. Spleen: Normal in size without focal abnormality. Adrenals/Urinary Tract: The adrenal glands are unremarkable. There is mild bilateral renal parenchyma atrophy. There is no hydronephrosis on either side. There is symmetric enhancement and excretion of contrast by both kidneys. Small bilateral parapelvic cysts. The visualized ureters appear unremarkable. The urinary bladder is collapsed. Stomach/Bowel: Multiple mildly dilated and fecalized small bowel loops in the lower abdomen noted measuring up to 3 cm. There is thickened and inflamed appearance of distal small bowel within the pelvis. A transition is noted in the right lower quadrant (58/2 and 66/4). Findings most consistent with developing small-bowel obstruction with possible associated enteritis. There is abutment of loops of small bowel to the anterior peritoneal wall consistent with adhesions. There is a stone at the base of the appendix. No inflammatory changes. Vascular/Lymphatic: Moderate aortoiliac atherosclerotic disease. The IVC is unremarkable. No portal venous gas.  There is no adenopathy. Reproductive: Hysterectomy. No adnexal masses. A pessary is noted in the pelvis. Other: Midline vertical anterior pelvic wall incisional scar. Musculoskeletal: Osteopenia with degenerative changes of the spine. No acute osseous pathology. IMPRESSION: 1. Findings most consistent with developing small-bowel obstruction with transition in the right lower quadrant. Possible associated enteritis. 2. Aortic Atherosclerosis (ICD10-I70.0). Electronically Signed   By: Anner Crete M.D.   On: 06/26/2021 02:43    EKG: None obtained in ED  Assessment/Plan Abdominal pain SBO     -  admitted to inpt, med-surg     - PRN pain control, anti-emetics     - SBO protocol initiated, NGT ordered     - gen surgery consulted (Dr. Johney Maine); appreciate assistance     - NPO     - IVF  Ovarian CA on chemo     - onco team aware of admission  Hx of DVT on eliquis     - now NPO; will start heparin gtt  Hypothyroidism     - resume home regimen when she comes off NPO status  GERD     - PPI  HTN     - resume home regimen when she comes off NPO status; will have PRNs available  DVT prophylaxis: heparin gtt  Code Status: DNR  Family Communication: w/ daughter at bedside  Consults called: General surgery   Status is: Inpatient  Remains inpatient appropriate because: severity of illness  Jonnie Finner DO [facility]  If 7PM-7AM, please contact night-coverage [email or web address]  06/26/2021, 7:38 AM

## 2021-06-26 NOTE — Progress Notes (Signed)
Transition of Care [facility]) Screening Note  Patient Details  Name: Paula Peterson Date of Birth: 1941-06-05  Transition of [facility]) CM/SW Contact:    Sherie Don, LCSW Phone Number: 06/26/2021, 10:58 AM  Transition of Care Department [facility]) has reviewed patient and no TOC needs have been identified at this time. We will continue to monitor patient advancement through interdisciplinary progression rounds. If new patient transition needs arise, please place a TOC consult.

## 2021-06-26 NOTE — Progress Notes (Signed)
ANTICOAGULATION CONSULT NOTE   Pharmacy Consult for heparin Indication: hx pulmonary embolus and DVT (home Eliquis on hold)  No Known Allergies  Patient Measurements: Height: 5\' 7"  (170.2 cm) Weight: 97.5 kg (215 lb) IBW/kg (Calculated) : 61.6 Heparin Dosing Weight: 83 kg  Vital Signs: Temp: 97.2 F (36.2 C) (12/29 0615) Temp Source: Oral (12/29 0615) BP: 127/66 (12/29 0615) Pulse Rate: 60 (12/29 0615)  Labs: Recent Labs    06/24/21 0817 06/25/21 2334 06/26/21 0700  HGB 9.9* 10.1* 10.1*  HCT 29.5* 30.0* 29.7*  PLT 139* 159 165  CREATININE 0.56 0.56 0.60    Estimated Creatinine Clearance: 69.5 mL/min (by C-G formula based on SCr of 0.6 mg/dL).   Medications:  - on Eliquis 5mg  bid PTA. Pt's daughter reported that her last dose taken PTA was on 06/25/21 at 0600  Assessment: Patient is a 80 y.o F with ovarian cancer currently undergoing chemotherapy treatment and a hx DVT/PE in 2020, presented to the ED on 06/25/21 with c/o abdominal pain and n/v.  Abdominal CT on 12/29 showed findings consistent with SBO. Patient's now NPO. Pharmacy has been consulted to transition patient to heparin drip.   Goal of Therapy:  Heparin level 0.3-0.7 units/ml aPTT 66-102  seconds Monitor platelets by anticoagulation protocol: Yes   Plan:  - baseline heparin level and aPTT now - start heparin drip at 1250 units/hr - check 8 hr heparin level and aPTT - monitor for s/sx bleeding - If to pursue invasive intervention for SBO, please advise if/when heparin drip needs to be help prior to procedure  Khaidyn Staebell P 06/26/2021,9:44 AM

## 2021-06-26 NOTE — Progress Notes (Signed)
ANTICOAGULATION CONSULT NOTE   Pharmacy Consult for heparin Indication: hx pulmonary embolus and DVT (home Eliquis on hold)  No Known Allergies  Patient Measurements: Height: 5\' 7"  (170.2 cm) Weight: 97.5 kg (215 lb) IBW/kg (Calculated) : 61.6 Heparin Dosing Weight: 83 kg  Vital Signs: Temp: 97.8 F (36.6 C) (12/29 1346) Temp Source: Oral (12/29 1037) BP: 123/59 (12/29 1346) Pulse Rate: 63 (12/29 1346)  Labs: Recent Labs    06/24/21 0817 06/25/21 2334 06/26/21 0700 06/26/21 1028 06/26/21 1907  HGB 9.9* 10.1* 10.1*  --   --   HCT 29.5* 30.0* 29.7*  --   --   PLT 139* 159 165  --   --   APTT  --   --   --  26 53*  HEPARINUNFRC  --   --   --  >1.10* 1.10*  CREATININE 0.56 0.56 0.60  --   --      Estimated Creatinine Clearance: 69.5 mL/min (by C-G formula based on SCr of 0.6 mg/dL).   Medications:  - on Eliquis 5mg  bid PTA. Pt's daughter reported that her last dose taken PTA was on 06/25/21 at 0600  Assessment: Patient is a 80 y.o F with ovarian cancer currently undergoing chemotherapy treatment and a hx DVT/PE in 2020, presented to the ED on 06/25/21 with c/o abdominal pain and n/v.  Abdominal CT on 12/29 showed findings consistent with SBO. Patient's now NPO. Pharmacy has been consulted to transition patient to heparin drip.  Today, 06/26/2021 aPTT 53, subtherapeutic on heparin 1250 units/hr Heparin level 1.1, elevated as expected with recent Eliquis use No bleeding or infusion issues documented   Goal of Therapy:  Heparin level 0.3-0.7 units/ml aPTT 66-102  seconds Monitor platelets by anticoagulation protocol: Yes   Plan:  - increase heparin drip to 1400 units/hr - check 8 hr heparin level and aPTT - monitor for s/sx bleeding  Peggyann Juba, PharmD, BCPS Pharmacy: [phone] 06/26/2021,8:19 PM

## 2021-06-26 NOTE — Consult Note (Signed)
[facility] Surgery Consult Note  Paula Peterson [facility] [birth year]  [id number].    Requesting MD: Cherylann Ratel Chief Complaint/Reason for Consult: SBO  HPI:  Paula Peterson is a 80yo female PMH ovarian cancer and h/o DVT/PE on eliquis (last dose 12/28 in AM), who was admitted to [facility] yesterday with SBO. She is s/p Exploratory laparotomy with right salpingo-oophorectomy, omentectomy radical tumor debulking for ovarian cancer 06/06/2019 by Dr. Denman George, currently undergoing chemotherapy Liposomal Doxorubicin + Carboplatin (last dose 06/24/21) with good response on CT scan from 06/13/21).  States that she developed acute onset severe abdominal pain yesterday. The pain is diffuse and constant. Associated with nausea and dry heaves. Last BM was yesterday morning and normal. She did pass some gas last night. No prior h/o SBO. In the ED she underwent CT scan which showed developing small-bowel obstruction with transition in the right lower quadrant. Patient was admitted to the medical service. General surgery asked to see.  ROS: Review of Systems  Gastrointestinal:  Positive for abdominal pain, nausea and vomiting.   All systems reviewed and otherwise negative except for as above  Family History  Problem Relation Age of Onset   Stomach cancer Sister        dx 61s-50s   Dementia Mother    Cancer Sister        unk type, hysterectomy, dx 15s    Past Medical History:  Diagnosis Date   Back pain    DVT (deep venous thrombosis) ([place]) 02/02/2019   had DVT first that moved into lung   GERD (gastroesophageal reflux disease)    History of pulmonary embolus (PE) 02/02/2019   confirmed by CT chest- had DVT first that moved into lungs per patient   Hypertension    Ovarian cancer ([place])    Pessary maintenance    Stroke [facility])     Past Surgical History:  Procedure Laterality Date   ABDOMINAL HYSTERECTOMY     ovaries left   BACK SURGERY     DEBULKING Bilateral 06/06/2019   Procedure: RADICAL TUMOR  DEBULKING;  Surgeon: Everitt Amber, MD;  Location: [facility] ORS;  Service: Gynecology;  Laterality: Bilateral;   FRACTURE SURGERY     IR IMAGING GUIDED PORT INSERTION  12/15/2018   OMENTECTOMY Bilateral 06/06/2019   Procedure: OMENTECTOMY;  Surgeon: Everitt Amber, MD;  Location: [facility] ORS;  Service: Gynecology;  Laterality: Bilateral;   SALPINGOOPHORECTOMY Bilateral 06/06/2019   Procedure: EXPLORATORY LAPAROTOMY,OPEN RIGHT SALPINGO OOPHORECTOMY;  Surgeon: Everitt Amber, MD;  Location: [facility] ORS;  Service: Gynecology;  Laterality: Bilateral;    Social History:  reports that she has never smoked. She has never used smokeless tobacco. She reports that she does not drink alcohol and does not use drugs.  Allergies: No Known Allergies  Medications Prior to Admission  Medication Sig Dispense Refill   acetaminophen (TYLENOL) 325 MG tablet Take 650 mg by mouth every 6 (six) hours as needed for moderate pain or headache.      amoxicillin (AMOXIL) 500 MG capsule Take 500 mg by mouth in the morning and at bedtime.     atenolol (TENORMIN) 25 MG tablet TAKE ONE (1) TABLET BY MOUTH EVERY DAY 30 tablet 1   cephALEXin (KEFLEX) 500 MG capsule Take 1 capsule (500 mg total) by mouth 3 (three) times daily. 21 capsule 0   cholecalciferol (VITAMIN D3) 25 MCG (1000 UNIT) tablet Take 1,000 Units by mouth daily.     ELIQUIS 5 MG TABS tablet TAKE ONE TABLET BY MOUTH TWICE DAILY  60 tablet 5   levothyroxine (SYNTHROID) 75 MCG tablet Take 1 tablet (75 mcg total) by mouth daily before breakfast. 30 tablet 3   lidocaine-prilocaine (EMLA) cream Apply to affected area once (Patient taking differently: Apply 1 application topically daily as needed (port access).) 30 g 3   loratadine (CLARITIN) 10 MG tablet Take 10 mg by mouth daily.     Melatonin 3 MG TABS Take by mouth.     ondansetron (ZOFRAN) 8 MG tablet Take 8 mg by mouth every 8 (eight) hours as needed.     ondansetron (ZOFRAN) 8 MG tablet Take 1 tablet (8 mg total) by mouth 2 (two) times  daily as needed (Nausea or vomiting). 30 tablet 1   pantoprazole (PROTONIX) 40 MG tablet Take 1 tablet (40 mg total) by mouth daily. 30 tablet 4   potassium chloride SA (KLOR-CON M) 20 MEQ tablet Take 1 tablet (20 mEq total) by mouth daily. 14 tablet 0   prochlorperazine (COMPAZINE) 10 MG tablet Take 10 mg by mouth every 6 (six) hours as needed.     prochlorperazine (COMPAZINE) 10 MG tablet Take 1 tablet (10 mg total) by mouth every 6 (six) hours as needed (Nausea or vomiting). 30 tablet 1   senna-docusate (SENOKOT-S) 8.6-50 MG tablet Take 2 tablets by mouth at bedtime. 60 tablet 3   vitamin B-12 1000 MCG tablet Take 1 tablet (1,000 mcg total) by mouth daily. 30 tablet 3    Prior to Admission medications   Medication Sig Start Date End Date Taking? Authorizing Provider  acetaminophen (TYLENOL) 325 MG tablet Take 650 mg by mouth every 6 (six) hours as needed for moderate pain or headache.     [provider]  amoxicillin (AMOXIL) 500 MG capsule Take 500 mg by mouth in the morning and at bedtime. 10/10/20   [provider]  atenolol (TENORMIN) 25 MG tablet TAKE ONE (1) TABLET BY MOUTH EVERY DAY 05/19/21   [name], MD  cephALEXin (KEFLEX) 500 MG capsule Take 1 capsule (500 mg total) by mouth 3 (three) times daily. 06/16/21   [name], MD  cholecalciferol (VITAMIN D3) 25 MCG (1000 UNIT) tablet Take 1,000 Units by mouth daily.    [provider]  ELIQUIS 5 MG TABS tablet TAKE ONE TABLET BY MOUTH TWICE DAILY 05/02/21   [name], MD  levothyroxine (SYNTHROID) 75 MCG tablet Take 1 tablet (75 mcg total) by mouth daily before breakfast. 07/18/20   Roxan Hockey, MD  lidocaine-prilocaine (EMLA) cream Apply to affected area once Patient taking differently: Apply 1 application topically daily as needed (port access). 12/07/18   [name], MD  loratadine (CLARITIN) 10 MG tablet Take 10 mg by mouth daily.    [provider]  Melatonin 3 MG TABS Take by mouth.     [provider]  ondansetron (ZOFRAN) 8 MG tablet Take 8 mg by mouth every 8 (eight) hours as needed.    [provider]  ondansetron (ZOFRAN) 8 MG tablet Take 1 tablet (8 mg total) by mouth 2 (two) times daily as needed (Nausea or vomiting). 12/20/20   [name], MD  pantoprazole (PROTONIX) 40 MG tablet Take 1 tablet (40 mg total) by mouth daily. 07/18/20   Roxan Hockey, MD  potassium chloride SA (KLOR-CON M) 20 MEQ tablet Take 1 tablet (20 mEq total) by mouth daily. 06/24/21   Wyatt Portela, MD  prochlorperazine (COMPAZINE) 10 MG tablet Take 10 mg by mouth every 6 (six) hours as needed.  [provider]  prochlorperazine (COMPAZINE) 10 MG tablet Take 1 tablet (10 mg total) by mouth every 6 (six) hours as needed (Nausea or vomiting). 12/20/20   [name], MD  senna-docusate (SENOKOT-S) 8.6-50 MG tablet Take 2 tablets by mouth at bedtime. 07/17/20   Roxan Hockey, MD  vitamin B-12 1000 MCG tablet Take 1 tablet (1,000 mcg total) by mouth daily. 07/18/20   Roxan Hockey, MD    Blood pressure 127/66, pulse 60, temperature (!) 97.2 F (36.2 C), temperature source Oral, resp. rate 18, height 5\' 7"  (1.702 m), weight 97.5 kg, SpO2 94 %. Physical Exam: General: pleasant, elderly female who is laying in bed in NAD but appears uncomfortable HEENT: head is normocephalic, atraumatic.  Sclera are noninjected.  Pupils equal and round.  Ears and nose without any masses or lesions.  Mouth is pink and moist. Dentition fair Heart: regular, rate, and rhythm.  Normal s1,s2. No obvious murmurs, gallops, or rubs noted.  Palpable pedal pulses bilaterally  Lungs: CTAB, no wheezes, rhonchi, or rales noted.  Respiratory effort nonlabored Abd: soft, mild distension, minimal diffuse TTP without rebound or guarding, hypoactive BS; no masses, hernias, or organomegaly MS: no BUE/BLE edema, calves soft and nontender Skin: warm and dry with no masses, lesions, or rashes Psych: A&Ox4 with  an appropriate affect Neuro: cranial nerves grossly intact, equal strength in BUE/BLE bilaterally, normal speech, thought process intact  Results for orders placed or performed during the hospital encounter of 06/25/21 (from the past 48 hour(s))  Lipase, blood     Status: None   Collection Time: 06/25/21 11:34 PM  Result Value Ref Range   Lipase 21 11 - 51 U/L    Comment: Performed at [facility], [place] [address], [place], [place] [address]  Urinalysis, Routine w reflex microscopic Urine, Clean Catch     Status: Abnormal   Collection Time: 06/25/21 11:34 PM  Result Value Ref Range   Color, Urine YELLOW YELLOW   APPearance HAZY (A) CLEAR   Specific Gravity, Urine 1.020 1.005 - 1.030   pH 6.0 5.0 - 8.0   Glucose, UA NEGATIVE NEGATIVE mg/dL   Hgb urine dipstick NEGATIVE NEGATIVE   Bilirubin Urine NEGATIVE NEGATIVE   Ketones, ur NEGATIVE NEGATIVE mg/dL   Protein, ur NEGATIVE NEGATIVE mg/dL   Nitrite NEGATIVE NEGATIVE   Leukocytes,Ua LARGE (A) NEGATIVE   RBC / HPF 0-5 0 - 5 RBC/hpf   WBC, UA >50 (H) 0 - 5 WBC/hpf   Bacteria, UA NONE SEEN NONE SEEN   Squamous Epithelial / LPF 0-5 0 - 5   Mucus PRESENT     Comment: Performed at [facility], [place] [address], [place], [place] [address]  Comprehensive metabolic panel     Status: Abnormal   Collection Time: 06/25/21 11:34 PM  Result Value Ref Range   Sodium 133 (L) 135 - 145 mmol/L   Potassium 4.0 3.5 - 5.1 mmol/L   Chloride 99 98 - 111 mmol/L   CO2 25 22 - 32 mmol/L   Glucose, Bld 132 (H) 70 - 99 mg/dL    Comment: Glucose reference range applies only to samples taken after fasting for at least 8 hours.   BUN 26 (H) 8 - 23 mg/dL   Creatinine, Ser 0.56 0.44 - 1.00 mg/dL   Calcium 9.7 8.9 - 10.3 mg/dL   Total Protein 7.3 6.5 - 8.1 g/dL   Albumin 3.7 3.5 - 5.0 g/dL   AST 25 15 - 41 U/L  ALT 23 0 - 44 U/L   Alkaline Phosphatase 73 38 - 126 U/L   Total Bilirubin 0.8 0.3 - 1.2 mg/dL   GFR,  Estimated >60 >60 mL/min    Comment: (NOTE) Calculated using the CKD-EPI Creatinine Equation (2021)    Anion gap 9 5 - 15    Comment: Performed at [facility], [place] [address], [place], [place] [address]  CBC with Differential     Status: Abnormal   Collection Time: 06/25/21 11:34 PM  Result Value Ref Range   WBC 7.6 4.0 - 10.5 K/uL   RBC 2.73 (L) 3.87 - 5.11 MIL/uL   Hemoglobin 10.1 (L) 12.0 - 15.0 g/dL   HCT 30.0 (L) 36.0 - 46.0 %   MCV 109.9 (H) 80.0 - 100.0 fL   MCH 37.0 (H) 26.0 - 34.0 pg   MCHC 33.7 30.0 - 36.0 g/dL   RDW 18.6 (H) 11.5 - 15.5 %   Platelets 159 150 - 400 K/uL   nRBC 0.3 (H) 0.0 - 0.2 %   Neutrophils Relative % 71 %   Neutro Abs 5.3 1.7 - 7.7 K/uL   Lymphocytes Relative 18 %   Lymphs Abs 1.4 0.7 - 4.0 K/uL   Monocytes Relative 11 %   Monocytes Absolute 0.9 0.1 - 1.0 K/uL   Eosinophils Relative 0 %   Eosinophils Absolute 0.0 0.0 - 0.5 K/uL   Basophils Relative 0 %   Basophils Absolute 0.0 0.0 - 0.1 K/uL   Immature Granulocytes 0 %   Abs Immature Granulocytes 0.03 0.00 - 0.07 K/uL    Comment: Performed at [facility], [place] [address], [place], [place] [address]  Resp Panel by RT-PCR (Flu A&B, Covid) Nasopharyngeal Swab     Status: None   Collection Time: 06/26/21  2:51 AM   Specimen: Nasopharyngeal Swab; Nasopharyngeal(NP) swabs in vial transport medium  Result Value Ref Range   SARS Coronavirus 2 by RT PCR NEGATIVE NEGATIVE    Comment: (NOTE) SARS-CoV-2 target nucleic acids are NOT DETECTED.  The SARS-CoV-2 RNA is generally detectable in upper respiratory specimens during the acute phase of infection. The lowest concentration of SARS-CoV-2 viral copies this assay can detect is 138 copies/mL. A negative result does not preclude SARS-Cov-2 infection and should not be used as the sole basis for treatment or other patient management decisions. A negative result may occur with  improper specimen collection/handling,  submission of specimen other than nasopharyngeal swab, presence of viral mutation(s) within the areas targeted by this assay, and inadequate number of viral copies(<138 copies/mL). A negative result must be combined with clinical observations, patient history, and epidemiological information. The expected result is Negative.  Fact Sheet for Patients:  EntrepreneurPulse.com.au  Fact Sheet for Healthcare Providers:  IncredibleEmployment.be  This test is no t yet approved or cleared by the [place] FDA and  has been authorized for detection and/or diagnosis of SARS-CoV-2 by FDA under an Emergency Use Authorization (EUA). This EUA will remain  in effect (meaning this test can be used) for the duration of the COVID-19 declaration under Section 564(b)(1) of the Act, 21 U.S.C.section 360bbb-3(b)(1), unless the authorization is terminated  or revoked sooner.       Influenza A by PCR NEGATIVE NEGATIVE   Influenza B by PCR NEGATIVE NEGATIVE    Comment: (NOTE) The Xpert Xpress SARS-CoV-2/FLU/RSV plus assay is intended as an aid in the diagnosis of influenza from Nasopharyngeal swab specimens and should not be used as a sole  basis for treatment. Nasal washings and aspirates are unacceptable for Xpert Xpress SARS-CoV-2/FLU/RSV testing.  Fact Sheet for Patients: EntrepreneurPulse.com.au  Fact Sheet for Healthcare Providers: IncredibleEmployment.be  This test is not yet approved or cleared by the [place] FDA and has been authorized for detection and/or diagnosis of SARS-CoV-2 by FDA under an Emergency Use Authorization (EUA). This EUA will remain in effect (meaning this test can be used) for the duration of the COVID-19 declaration under Section 564(b)(1) of the Act, 21 U.S.C. section 360bbb-3(b)(1), unless the authorization is terminated or revoked.  Performed at [facility], [place]  [address], [place], [place] [address]   Magnesium     Status: Abnormal   Collection Time: 06/26/21  7:00 AM  Result Value Ref Range   Magnesium 1.5 (L) 1.7 - 2.4 mg/dL    Comment: Performed at [facility], [place] [address] [place], [place] [address]  Comprehensive metabolic panel     Status: Abnormal   Collection Time: 06/26/21  7:00 AM  Result Value Ref Range   Sodium 132 (L) 135 - 145 mmol/L   Potassium 3.8 3.5 - 5.1 mmol/L   Chloride 98 98 - 111 mmol/L   CO2 26 22 - 32 mmol/L   Glucose, Bld 121 (H) 70 - 99 mg/dL    Comment: Glucose reference range applies only to samples taken after fasting for at least 8 hours.   BUN 23 8 - 23 mg/dL   Creatinine, Ser 0.60 0.44 - 1.00 mg/dL   Calcium 9.4 8.9 - 10.3 mg/dL   Total Protein 6.7 6.5 - 8.1 g/dL   Albumin 3.5 3.5 - 5.0 g/dL   AST 22 15 - 41 U/L   ALT 21 0 - 44 U/L   Alkaline Phosphatase 70 38 - 126 U/L   Total Bilirubin 0.8 0.3 - 1.2 mg/dL   GFR, Estimated >60 >60 mL/min    Comment: (NOTE) Calculated using the CKD-EPI Creatinine Equation (2021)    Anion gap 8 5 - 15    Comment: Performed at [facility], [place] [address], [place], [place] [address]  CBC     Status: Abnormal   Collection Time: 06/26/21  7:00 AM  Result Value Ref Range   WBC 5.0 4.0 - 10.5 K/uL   RBC 2.72 (L) 3.87 - 5.11 MIL/uL   Hemoglobin 10.1 (L) 12.0 - 15.0 g/dL   HCT 29.7 (L) 36.0 - 46.0 %   MCV 109.2 (H) 80.0 - 100.0 fL   MCH 37.1 (H) 26.0 - 34.0 pg   MCHC 34.0 30.0 - 36.0 g/dL   RDW 18.7 (H) 11.5 - 15.5 %   Platelets 165 150 - 400 K/uL   nRBC 0.0 0.0 - 0.2 %    Comment: Performed at [facility], [place] [address] [place],  [address]   CT ABDOMEN PELVIS W CONTRAST  Result Date: 06/26/2021 CLINICAL DATA:  Abdominal pain. History of right ovarian cancer status post TAH/BSO and complete chemotherapy. EXAM: CT ABDOMEN AND PELVIS WITH CONTRAST TECHNIQUE: Multidetector CT imaging of the abdomen and  pelvis was performed using the standard protocol following bolus administration of intravenous contrast. CONTRAST:  71mL OMNIPAQUE IOHEXOL 350 MG/ML SOLN COMPARISON:  CT abdomen pelvis dated 06/13/2021. FINDINGS: Lower chest: Bibasilar linear atelectasis/scarring. The visualized lung bases are otherwise clear. Coronary vascular calcifications. No intra-abdominal free air.  Small free fluid within the pelvis. Hepatobiliary: No focal liver abnormality is seen. No gallstones, gallbladder wall thickening, or biliary  dilatation. Pancreas: Unremarkable. No pancreatic ductal dilatation or surrounding inflammatory changes. Spleen: Normal in size without focal abnormality. Adrenals/Urinary Tract: The adrenal glands are unremarkable. There is mild bilateral renal parenchyma atrophy. There is no hydronephrosis on either side. There is symmetric enhancement and excretion of contrast by both kidneys. Small bilateral parapelvic cysts. The visualized ureters appear unremarkable. The urinary bladder is collapsed. Stomach/Bowel: Multiple mildly dilated and fecalized small bowel loops in the lower abdomen noted measuring up to 3 cm. There is thickened and inflamed appearance of distal small bowel within the pelvis. A transition is noted in the right lower quadrant (58/2 and 66/4). Findings most consistent with developing small-bowel obstruction with possible associated enteritis. There is abutment of loops of small bowel to the anterior peritoneal wall consistent with adhesions. There is a stone at the base of the appendix. No inflammatory changes. Vascular/Lymphatic: Moderate aortoiliac atherosclerotic disease. The IVC is unremarkable. No portal venous gas. There is no adenopathy. Reproductive: Hysterectomy. No adnexal masses. A pessary is noted in the pelvis. Other: Midline vertical anterior pelvic wall incisional scar. Musculoskeletal: Osteopenia with degenerative changes of the spine. No acute osseous pathology. IMPRESSION: 1.  Findings most consistent with developing small-bowel obstruction with transition in the right lower quadrant. Possible associated enteritis. 2. Aortic Atherosclerosis (ICD10-I70.0). Electronically Signed   By: Anner Crete M.D.   On: 06/26/2021 02:43      Assessment/Plan SBO - CT scan shows developing small-bowel obstruction with transition in the right lower quadrant - WBC 5, VSS - Recommend bowel rest with NPO and NG tube to LIWS. Start small bowel obstruction protocol. No indication for acute surgical intervention. We will follow.   ID - none VTE - on heparin gtt FEN - IVF, NPO/NGT to LIWS Foley - none  Ovarian cancer  -s/p ex lap right salpingo-oophorectomy, omentectomy radical tumor debulking 06/06/2019 by Dr. Denman George - currently undergoing chemotherapy Liposomal Doxorubicin + Carboplatin (last dose 06/24/21) with good response on CT scan from 06/13/21), follow by Dr. Alvy Bimler H/o DVT/PE on eliquis (last dose 12/28 in AM) - hold eliquis Hypothyroidism GERD HTN DNR  [place], [facility] Surgery 06/26/2021, 9:24 AM Please see Amion for pager number during day hours 7:00am-4:30pm

## 2021-06-26 NOTE — Progress Notes (Signed)
Carry over admit to day admitter. I discussed case with EDP, Dr. Florina Ou. Per these discussions:  80 year old female with history of ovarian cancer undergoing chemotherapy, who is being admitted for small bowel suction after presenting to [facility] long ED for 1 day of abdominal discomfort associated with nausea/vomiting, with ensuing CT abdomen/pelvis suggestive of early small bowel obstruction.  Patient underwent most recent chemotherapy on 06/24/2021, before developing 1 day of progressive abdominal discomfort, associated with intermittent nausea, vomiting, inability to tolerate p.o,   In [facility] ED today: VSS. Ct abd/pelvis with results as above.   Subsequent placed patient admission order to [place] for further evaluation and management small bowel obstruction. I have also placed preliminary admission orders via the Adult Multimorbid admission order set, including orders for n.p.o., prn IV fentanyl, prn IV Zofran, continuous IV fluids, and orders for AM labs.     Babs Bertin, DO Hospitalist

## 2021-06-27 ENCOUNTER — Inpatient Hospital Stay (HOSPITAL_COMMUNITY): Payer: Medicare Other

## 2021-06-27 DIAGNOSIS — K56609 Unspecified intestinal obstruction, unspecified as to partial versus complete obstruction: Secondary | ICD-10-CM | POA: Diagnosis not present

## 2021-06-27 LAB — APTT
aPTT: 79 seconds — ABNORMAL HIGH (ref 24–36)
aPTT: 84 seconds — ABNORMAL HIGH (ref 24–36)

## 2021-06-27 LAB — CBC
HCT: 28.8 % — ABNORMAL LOW (ref 36.0–46.0)
Hemoglobin: 9.9 g/dL — ABNORMAL LOW (ref 12.0–15.0)
MCH: 37.2 pg — ABNORMAL HIGH (ref 26.0–34.0)
MCHC: 34.4 g/dL (ref 30.0–36.0)
MCV: 108.3 fL — ABNORMAL HIGH (ref 80.0–100.0)
Platelets: 164 10*3/uL (ref 150–400)
RBC: 2.66 MIL/uL — ABNORMAL LOW (ref 3.87–5.11)
RDW: 18.4 % — ABNORMAL HIGH (ref 11.5–15.5)
WBC: 5.2 10*3/uL (ref 4.0–10.5)
nRBC: 0 % (ref 0.0–0.2)

## 2021-06-27 LAB — COMPREHENSIVE METABOLIC PANEL
ALT: 18 U/L (ref 0–44)
AST: 19 U/L (ref 15–41)
Albumin: 3.2 g/dL — ABNORMAL LOW (ref 3.5–5.0)
Alkaline Phosphatase: 71 U/L (ref 38–126)
Anion gap: 10 (ref 5–15)
BUN: 20 mg/dL (ref 8–23)
CO2: 28 mmol/L (ref 22–32)
Calcium: 9.4 mg/dL (ref 8.9–10.3)
Chloride: 97 mmol/L — ABNORMAL LOW (ref 98–111)
Creatinine, Ser: 0.61 mg/dL (ref 0.44–1.00)
GFR, Estimated: >60 mL/min (ref >60)
Glucose, Bld: 100 mg/dL — ABNORMAL HIGH (ref 70–99)
Potassium: 4.1 mmol/L (ref 3.5–5.1)
Sodium: 135 mmol/L (ref 135–145)
Total Bilirubin: 0.8 mg/dL (ref 0.3–1.2)
Total Protein: 6.4 g/dL — ABNORMAL LOW (ref 6.5–8.1)

## 2021-06-27 LAB — HEPARIN LEVEL (UNFRACTIONATED): Heparin Unfractionated: 1.07 IU/mL — ABNORMAL HIGH (ref 0.30–0.70)

## 2021-06-27 LAB — MAGNESIUM: Magnesium: 1.5 mg/dL — ABNORMAL LOW (ref 1.7–2.4)

## 2021-06-27 MED ORDER — ACETAMINOPHEN 10 MG/ML IV SOLN: 1000.0000 mg | Freq: Four times a day (QID) | INTRAVENOUS | Status: DC

## 2021-06-27 MED ORDER — SODIUM CHLORIDE 0.9% FLUSH
10.0000 mL | Freq: Two times a day (BID) | INTRAVENOUS | Status: DC
  Administered 2021-06-30 – 2021-07-01 (×2): 10 mL

## 2021-06-27 MED ORDER — MAGNESIUM SULFATE IN D5W 1-5 GM/100ML-% IV SOLN
1.0000 g | Freq: Once | INTRAVENOUS | Status: AC
Start: 2021-06-27 — End: 2021-06-27
  Administered 2021-06-27: 11:00:00 1 g via INTRAVENOUS
  Filled 2021-06-27: qty 100

## 2021-06-27 MED ORDER — PHENOL 1.4 % MT LIQD: 1.0000 | OROMUCOSAL | Status: DC | PRN

## 2021-06-27 MED ORDER — LIDOCAINE-PRILOCAINE 2.5-2.5 % EX CREA
TOPICAL_CREAM | Freq: Every day | CUTANEOUS | Status: DC | PRN
  Filled 2021-06-27 (×2): qty 5

## 2021-06-27 MED ORDER — CHLORHEXIDINE GLUCONATE CLOTH 2 % EX PADS
6.0000 | MEDICATED_PAD | Freq: Every day | CUTANEOUS | Status: DC
  Administered 2021-06-28 – 2021-06-30 (×3): 6 via TOPICAL

## 2021-06-27 MED ORDER — SODIUM CHLORIDE 0.9% FLUSH: 10.0000 mL | INTRAVENOUS | Status: DC | PRN

## 2021-06-27 MED ORDER — ACETAMINOPHEN 10 MG/ML IV SOLN
1000.0000 mg | Freq: Four times a day (QID) | INTRAVENOUS | Status: AC
  Administered 2021-06-27 – 2021-06-28 (×4): 1000 mg via INTRAVENOUS
  Filled 2021-06-27 (×4): qty 100

## 2021-06-27 MED ORDER — MENTHOL 3 MG MT LOZG: 1.0000 | LOZENGE | OROMUCOSAL | Status: DC | PRN

## 2021-06-27 NOTE — Progress Notes (Signed)
Mobility Specialist - Cancellation Note    06/27/21 1552  Mobility  Activity Refused mobility   Reason for Cancellation: Pt refused mobility at this time. Stated she "got settled after a bath" and prefers to rest at this time. Will check back as schedule permits.   [place] Specialist Acute Rehabilitation Services Phone: [phone] 06/27/21, 3:54 PM

## 2021-06-27 NOTE — Progress Notes (Signed)
Progress Note     Subjective: Pt reports abdomen is a little sore and feels tight but no severe abdominal pain. She is not passing flatus or having BMs. She has had about 850 cc out of NGT in last 24 hrs. Did not get up and ambulate at all yesterday.   Objective: Vital signs in last 24 hours: Temp:  [97.8 F (36.6 C)-98.5 F (36.9 C)] 97.8 F (36.6 C) (12/30 0510) Pulse Rate:  [63-73] 73 (12/30 0510) Resp:  [14-16] 16 (12/30 0510) BP: (99-134)/(43-61) 99/43 (12/30 0510) SpO2:  [91 %-95 %] 91 % (12/30 0510) Last BM Date: 06/25/21  Intake/Output from previous day: 12/29 0701 - 12/30 0700 In: 1135.2 [I.V.:1035.2; IV Piggyback:100] Out: 7106 [Urine:950; Emesis/NG output:854] Intake/Output this shift: No intake/output data recorded.  PE: General: pleasant, WD, elderly female who is laying in bed in NAD Heart: regular, rate, and rhythm.   Lungs: Respiratory effort nonlabored Abd: soft, mild generalized ttp without peritonitis, ND, +BS, NGT with thin bilious drainage Psych: A&Ox3 with an appropriate affect.    Lab Results:  Recent Labs    06/26/21 0700 06/27/21 0501  WBC 5.0 5.2  HGB 10.1* 9.9*  HCT 29.7* 28.8*  PLT 165 164   BMET Recent Labs    06/26/21 0700 06/27/21 0501  NA 132* 135  K 3.8 4.1  CL 98 97*  CO2 26 28  GLUCOSE 121* 100*  BUN 23 20  CREATININE 0.60 0.61  CALCIUM 9.4 9.4   PT/INR No results for input(s): LABPROT, INR in the last 72 hours. CMP     Component Value Date/Time   NA 135 06/27/2021 0501   K 4.1 06/27/2021 0501   CL 97 (L) 06/27/2021 0501   CO2 28 06/27/2021 0501   GLUCOSE 100 (H) 06/27/2021 0501   BUN 20 06/27/2021 0501   CREATININE 0.61 06/27/2021 0501   CREATININE 0.56 06/24/2021 0817   CALCIUM 9.4 06/27/2021 0501   PROT 6.4 (L) 06/27/2021 0501   ALBUMIN 3.2 (L) 06/27/2021 0501   AST 19 06/27/2021 0501   AST 11 (L) 06/24/2021 0817   ALT 18 06/27/2021 0501   ALT 9 06/24/2021 0817   ALKPHOS 71 06/27/2021 0501    BILITOT 0.8 06/27/2021 0501   BILITOT 0.2 (L) 06/24/2021 0817   GFRNONAA >60 06/27/2021 0501   GFRNONAA >60 06/24/2021 0817   GFRAA >60 03/25/2020 1144   Lipase     Component Value Date/Time   LIPASE 21 06/25/2021 2334       Studies/Results: CT ABDOMEN PELVIS W CONTRAST  Result Date: 06/26/2021 CLINICAL DATA:  Abdominal pain. History of right ovarian cancer status post TAH/BSO and complete chemotherapy. EXAM: CT ABDOMEN AND PELVIS WITH CONTRAST TECHNIQUE: Multidetector CT imaging of the abdomen and pelvis was performed using the standard protocol following bolus administration of intravenous contrast. CONTRAST:  43mL OMNIPAQUE IOHEXOL 350 MG/ML SOLN COMPARISON:  CT abdomen pelvis dated 06/13/2021. FINDINGS: Lower chest: Bibasilar linear atelectasis/scarring. The visualized lung bases are otherwise clear. Coronary vascular calcifications. No intra-abdominal free air.  Small free fluid within the pelvis. Hepatobiliary: No focal liver abnormality is seen. No gallstones, gallbladder wall thickening, or biliary dilatation. Pancreas: Unremarkable. No pancreatic ductal dilatation or surrounding inflammatory changes. Spleen: Normal in size without focal abnormality. Adrenals/Urinary Tract: The adrenal glands are unremarkable. There is mild bilateral renal parenchyma atrophy. There is no hydronephrosis on either side. There is symmetric enhancement and excretion of contrast by both kidneys. Small bilateral parapelvic cysts. The visualized ureters appear  unremarkable. The urinary bladder is collapsed. Stomach/Bowel: Multiple mildly dilated and fecalized small bowel loops in the lower abdomen noted measuring up to 3 cm. There is thickened and inflamed appearance of distal small bowel within the pelvis. A transition is noted in the right lower quadrant (58/2 and 66/4). Findings most consistent with developing small-bowel obstruction with possible associated enteritis. There is abutment of loops of small bowel  to the anterior peritoneal wall consistent with adhesions. There is a stone at the base of the appendix. No inflammatory changes. Vascular/Lymphatic: Moderate aortoiliac atherosclerotic disease. The IVC is unremarkable. No portal venous gas. There is no adenopathy. Reproductive: Hysterectomy. No adnexal masses. A pessary is noted in the pelvis. Other: Midline vertical anterior pelvic wall incisional scar. Musculoskeletal: Osteopenia with degenerative changes of the spine. No acute osseous pathology. IMPRESSION: 1. Findings most consistent with developing small-bowel obstruction with transition in the right lower quadrant. Possible associated enteritis. 2. Aortic Atherosclerosis (ICD10-I70.0). Electronically Signed   By: Anner Crete M.D.   On: 06/26/2021 02:43   DG Abd Portable 1V-Small Bowel Obstruction Protocol-initial, 8 hr delay  Result Date: 06/26/2021 CLINICAL DATA:  Small bowel protocol.  8 hours post-contrast film. EXAM: PORTABLE ABDOMEN - 1 VIEW COMPARISON:  June 26, 2021 (9:03 a.m.) FINDINGS: A nasogastric tube is seen with its distal tip noted within the body of the stomach. The distal side hole sits approximately 4.6 cm distal to the gastroesophageal junction. A paucity of bowel gas is noted throughout the abdomen. Radiopaque contrast is not clearly identified within the large or small bowel and may be markedly diluted. No radio-opaque calculi or other significant radiographic abnormality are seen. IMPRESSION: 1. Nasogastric tube positioning, as described above. 2. Radiopaque contrast is not clearly identified within the large or small bowel and may be markedly diluted. Electronically Signed   By: Virgina Norfolk M.D.   On: 06/26/2021 22:12   DG Abd Portable 1V-Small Bowel Protocol-Position Verification  Result Date: 06/26/2021 CLINICAL DATA:  Nasogastric tube placement. EXAM: PORTABLE ABDOMEN - 1 VIEW COMPARISON:  Abdominal CT 06/26/2021. FINDINGS: 0903 hours. Enteric tube projects  below the diaphragm, tip at the level of the mid stomach. The tube is mildly kinked at its side hole. A right-sided Port-A-Cath tip projects to the level of the superior cavoatrial junction. Mild atelectasis is present at both lung bases. The visualized bowel gas pattern is normal. IMPRESSION: Enteric tube projects over the mid stomach, slightly kinked at its side hole. Electronically Signed   By: Richardean Sale M.D.   On: 06/26/2021 10:54    Anti-infectives: Anti-infectives (From admission, onward)    None        Assessment/Plan SBO - CT scan shows developing small-bowel obstruction with transition in the right lower quadrant - WBC 5.2, VSS - Recommend bowel rest with NPO and NG tube to LIWS. SBO protocol started yesterday - 8h delay film without passage of contrast to colon, 24h delay should be around 1400 today  - mobilize as tolerated, PT consult ordered - K 4.1 this AM, Mg 1.5 - ordered IV Mg replacement - hopefully this will resolve without surgical intervention, if it does not improve with conservative management would need to consider ex-lap vs discussion of venting g-tube. Surgery would be higher risk in setting of active chemo.    ID - none VTE - on heparin gtt FEN - IVF, NPO/NGT to LIWS Foley - none   Ovarian cancer  -s/p ex lap right salpingo-oophorectomy, omentectomy radical tumor debulking 06/06/2019 by  Dr. Denman George - currently undergoing chemotherapy Liposomal Doxorubicin + Carboplatin (last dose 06/24/21) with good response on CT scan from 06/13/21), follow by Dr. Alvy Bimler H/o DVT/PE on eliquis (last dose 12/28 in AM) - hold eliquis Hypothyroidism GERD HTN DNR  LOS: 1 day    Norm Parcel, [facility] Surgery 06/27/2021, 8:28 AM Please see Amion for pager number during day hours 7:00am-4:30pm

## 2021-06-27 NOTE — Progress Notes (Signed)
ANTICOAGULATION CONSULT NOTE   Pharmacy Consult for heparin Indication: hx pulmonary embolus and DVT (home Eliquis on hold)  No Known Allergies  Patient Measurements: Height: 5\' 7"  (170.2 cm) Weight: 97.5 kg (215 lb) IBW/kg (Calculated) : 61.6 Heparin Dosing Weight: 83 kg  Vital Signs: Temp: 97.6 F (36.4 C) (12/30 1341) Temp Source: Oral (12/30 1341) BP: 136/58 (12/30 1341) Pulse Rate: 73 (12/30 1341)  Labs: Recent Labs    06/25/21 2334 06/26/21 0700 06/26/21 1028 06/26/21 1028 06/26/21 1907 06/27/21 0501 06/27/21 1636  HGB 10.1* 10.1*  --   --   --  9.9*  --   HCT 30.0* 29.7*  --   --   --  28.8*  --   PLT 159 165  --   --   --  164  --   APTT  --   --  26   < > 53* 79* 84*  HEPARINUNFRC  --   --  >1.10*  --  1.10* 1.07*  --   CREATININE 0.56 0.60  --   --   --  0.61  --    < > = values in this interval not displayed.     Estimated Creatinine Clearance: 69.5 mL/min (by C-G formula based on SCr of 0.61 mg/dL).   Medications:  - on Eliquis 5mg  bid PTA. Pt's daughter reported that her last dose taken PTA was on 06/25/21 at 0600  Assessment: Patient is a 80 y.o F with ovarian cancer currently undergoing chemotherapy treatment and a hx DVT/PE in 2020, presented to the ED on 06/25/21 with c/o abdominal pain and n/v.  Abdominal CT on 12/29 showed findings consistent with SBO. Patient's now NPO. Pharmacy has been consulted to transition patient to heparin drip.  Today, 06/27/2021 Confirmatory aPTT 84, therapeutic on heparin 1400 units/hr Hgb 9.9-stable, plts WNL No bleeding or infusion issues documented   Goal of Therapy:  Heparin level 0.3-0.7 units/ml aPTT 66-102  seconds Monitor platelets by anticoagulation protocol: Yes   Plan:  Continue heparin drip at 1400 units/hr Monitor daily aPTT until correlates with daily heparin level then use daily heparin level only, CBC, signs/symptoms of bleeding   Royetta Asal, PharmD, [place] Please utilize Amion for appropriate phone number to reach the unit pharmacist ([place]) 06/27/2021 6:00 PM

## 2021-06-27 NOTE — Progress Notes (Signed)
Paula Peterson   DOB:June 17, 1941   XK#:[id number]    ASSESSMENT & PLAN:  Recurrent ovarian cancer Her most recent CT imaging done 2 weeks ago show positive response to treatment Continue supportive care   Small bowel obstruction I have personally reviewed the imaging study Most likely this is adhesions related but malignancy causing bowel obstruction cannot be excluded although she has good response to treatment recently Agree with conservative approach with NG tube placement, n.p.o. and supportive care Appreciate general surgery follow-up   Abdominal pain Due to small bowel obstruction I adjusted her morphine sulfate IV doses as needed, pain is better controlled today   Mild anemia Due to recent chemotherapy Observe   History of recurrent PE On anticoagulation therapy, transition to heparin due to n.p.o. status   Code Status DNR   Goals of care Resolution of bowel obstruction   Discharge planning She will likely be here for the next 3 to 5 days I will get consult service to check on her this weekend All questions were answered. The patient knows to call the clinic with any problems, questions or concerns.   The total time spent in the appointment was 25 minutes encounter with patients including review of chart and various tests results, discussions about plan of care and coordination of care plan  [name], MD 06/27/2021 1:47 PM  Subjective:  She felt better when I saw her Pain is better controlled No signs of bleeding  Objective:  Vitals:   06/27/21 1002 06/27/21 1341  BP: (!) 109/52 (!) 136/58  Pulse: 72 73  Resp: 16 16  Temp: 97.7 F (36.5 C) 97.6 F (36.4 C)  SpO2: 92% 94%     Intake/Output Summary (Last 24 hours) at 06/27/2021 1347 Last data filed at 06/27/2021 1343 Gross per 24 hour  Intake 1615.01 ml  Output 2454 ml  Net -838.99 ml    GENERAL:alert, no distress and comfortable, NG tube in situ NEURO: alert & oriented x 3 with fluent speech, no  focal motor/sensory deficits   Labs:  Recent Labs    06/25/21 2334 06/26/21 0700 06/27/21 0501  NA 133* 132* 135  K 4.0 3.8 4.1  CL 99 98 97*  CO2 25 26 28   GLUCOSE 132* 121* 100*  BUN 26* 23 20  CREATININE 0.56 0.60 0.61  CALCIUM 9.7 9.4 9.4  GFRNONAA >60 >60 >60  PROT 7.3 6.7 6.4*  ALBUMIN 3.7 3.5 3.2*  AST 25 22 19   ALT 23 21 18   ALKPHOS 73 70 71  BILITOT 0.8 0.8 0.8    Studies:  CT ABDOMEN PELVIS W CONTRAST  Result Date: 06/26/2021 CLINICAL DATA:  Abdominal pain. History of right ovarian cancer status post TAH/BSO and complete chemotherapy. EXAM: CT ABDOMEN AND PELVIS WITH CONTRAST TECHNIQUE: Multidetector CT imaging of the abdomen and pelvis was performed using the standard protocol following bolus administration of intravenous contrast. CONTRAST:  46mL OMNIPAQUE IOHEXOL 350 MG/ML SOLN COMPARISON:  CT abdomen pelvis dated 06/13/2021. FINDINGS: Lower chest: Bibasilar linear atelectasis/scarring. The visualized lung bases are otherwise clear. Coronary vascular calcifications. No intra-abdominal free air.  Small free fluid within the pelvis. Hepatobiliary: No focal liver abnormality is seen. No gallstones, gallbladder wall thickening, or biliary dilatation. Pancreas: Unremarkable. No pancreatic ductal dilatation or surrounding inflammatory changes. Spleen: Normal in size without focal abnormality. Adrenals/Urinary Tract: The adrenal glands are unremarkable. There is mild bilateral renal parenchyma atrophy. There is no hydronephrosis on either side. There is symmetric enhancement and excretion of contrast by  both kidneys. Small bilateral parapelvic cysts. The visualized ureters appear unremarkable. The urinary bladder is collapsed. Stomach/Bowel: Multiple mildly dilated and fecalized small bowel loops in the lower abdomen noted measuring up to 3 cm. There is thickened and inflamed appearance of distal small bowel within the pelvis. A transition is noted in the right lower quadrant (58/2  and 66/4). Findings most consistent with developing small-bowel obstruction with possible associated enteritis. There is abutment of loops of small bowel to the anterior peritoneal wall consistent with adhesions. There is a stone at the base of the appendix. No inflammatory changes. Vascular/Lymphatic: Moderate aortoiliac atherosclerotic disease. The IVC is unremarkable. No portal venous gas. There is no adenopathy. Reproductive: Hysterectomy. No adnexal masses. A pessary is noted in the pelvis. Other: Midline vertical anterior pelvic wall incisional scar. Musculoskeletal: Osteopenia with degenerative changes of the spine. No acute osseous pathology. IMPRESSION: 1. Findings most consistent with developing small-bowel obstruction with transition in the right lower quadrant. Possible associated enteritis. 2. Aortic Atherosclerosis (ICD10-I70.0). Electronically Signed   By: Anner Crete M.D.   On: 06/26/2021 02:43   CT ABDOMEN PELVIS W CONTRAST  Result Date: 06/15/2021 CLINICAL DATA:  Right ovarian cancer, status post TAH/BSO, chemotherapy complete EXAM: CT ABDOMEN AND PELVIS WITH CONTRAST TECHNIQUE: Multidetector CT imaging of the abdomen and pelvis was performed using the standard protocol following bolus administration of intravenous contrast. CONTRAST:  69mL OMNIPAQUE IOHEXOL 350 MG/ML SOLN COMPARISON:  03/19/2021 FINDINGS: Lower chest: Lung bases are essentially clear. Hepatobiliary: Liver is within normal limits. Gallbladder is unremarkable. No intrahepatic or extrahepatic duct dilatation. Pancreas: Within normal limits. Spleen: Within normal limits. Adrenals/Urinary Tract: Adrenal glands are within normal limits. Kidneys are within normal limits.  No hydronephrosis. Bladder is within normal limits. Stomach/Bowel: Stomach is within normal limits. No evidence of bowel obstruction. Normal appendix (series 2/image 86). No colonic wall thickening or inflammatory changes. Vascular/Lymphatic: No evidence of  abdominal aortic aneurysm. Atherosclerotic calcifications of the abdominal aorta and branch vessels. Prior portacaval node has essentially resolved. Additional 9 mm short axis retrocaval node is grossly unchanged (series 2/image 25). 16 mm short axis right inguinal node (series 2/image 74), previously 20 mm. Additional 11 mm short axis left inguinal node, previously 16 mm (series 2/image 72). Reproductive: Status post hysterectomy.  Vaginal pessary. Bilateral ovaries are within normal limits. Other: No abdominopelvic ascites. Scattered peritoneal nodularity, improved. Dominant 1.2 x 4.6 cm implant beneath the right mid abdominal wall (series 2/image 49), previously 1.9 x 6.0 cm. Additional 1.4 x 2.2 cm implant in the left lower pelvis adjacent to the rectosigmoid colon (series 2/image 68), previously 1.8 x 2.5 cm. Musculoskeletal: Degenerative changes of the visualized thoracolumbar spine. IMPRESSION: Improving abdominopelvic lymphadenopathy, including a dominant 16 mm short axis right inguinal node, as above. Improving peritoneal carcinomatosis, including a dominant 1.2 x 4.6 cm implant beneath the right mid abdominal wall. Electronically Signed   By: Julian Hy M.D.   On: 06/15/2021 14:56   DG Abd Portable 1V-Small Bowel Obstruction Protocol-initial, 8 hr delay  Result Date: 06/26/2021 CLINICAL DATA:  Small bowel protocol.  8 hours post-contrast film. EXAM: PORTABLE ABDOMEN - 1 VIEW COMPARISON:  June 26, 2021 (9:03 a.m.) FINDINGS: A nasogastric tube is seen with its distal tip noted within the body of the stomach. The distal side hole sits approximately 4.6 cm distal to the gastroesophageal junction. A paucity of bowel gas is noted throughout the abdomen. Radiopaque contrast is not clearly identified within the large or small bowel and may  be markedly diluted. No radio-opaque calculi or other significant radiographic abnormality are seen. IMPRESSION: 1. Nasogastric tube positioning, as described  above. 2. Radiopaque contrast is not clearly identified within the large or small bowel and may be markedly diluted. Electronically Signed   By: Virgina Norfolk M.D.   On: 06/26/2021 22:12   DG Abd Portable 1V-Small Bowel Protocol-Position Verification  Result Date: 06/26/2021 CLINICAL DATA:  Nasogastric tube placement. EXAM: PORTABLE ABDOMEN - 1 VIEW COMPARISON:  Abdominal CT 06/26/2021. FINDINGS: 0903 hours. Enteric tube projects below the diaphragm, tip at the level of the mid stomach. The tube is mildly kinked at its side hole. A right-sided Port-A-Cath tip projects to the level of the superior cavoatrial junction. Mild atelectasis is present at both lung bases. The visualized bowel gas pattern is normal. IMPRESSION: Enteric tube projects over the mid stomach, slightly kinked at its side hole. Electronically Signed   By: Richardean Sale M.D.   On: 06/26/2021 10:54

## 2021-06-27 NOTE — Evaluation (Signed)
Physical Therapy Evaluation Patient Details Name: Paula Peterson MRN: [id number] DOB: 06-16-1941 Today's Date: 06/27/2021  History of Present Illness  Paula Peterson is a 80 y.o. female with medical history significant of ovarian cancer, GERD, DVT on eliquis, hypothyroidism, HTN. Presenting with abdominal pain, N/V. She had a chemo session 2 days ago. Yesterday, she didn't feel well. She started having a sharp, global abdominal pain. CT showed SBO.  Clinical Impression  Pt admitted with above diagnosis.  Pt currently with functional limitations due to the deficits listed below (see PT Problem List). Pt will benefit from skilled PT to increase their independence and safety with mobility to allow discharge to the venue listed below.  Pt was able to pass some gas during session.  Her grand-daughter who is an ICU nurse was in her room with her.  Depending on progress she may benefit from HHPT at time of d/c.        Recommendations for follow up therapy are one component of a multi-disciplinary discharge planning process, led by the attending physician.  Recommendations may be updated based on patient status, additional functional criteria and insurance authorization.  Follow Up Recommendations Home health PT    Assistance Recommended at Discharge Frequent or constant Supervision/Assistance  Functional Status Assessment Patient has had a recent decline in their functional status and demonstrates the ability to make significant improvements in function in a reasonable and predictable amount of time.  Equipment Recommendations  None recommended by PT    Recommendations for Other Services       Precautions / Restrictions Precautions Precautions: Fall Restrictions Weight Bearing Restrictions: No      Mobility  Bed Mobility Overal bed mobility: Needs Assistance Bed Mobility: Supine to Sit     Supine to sit: Min assist     General bed mobility comments: MIN A to get trunk upright     Transfers Overall transfer level: Needs assistance Equipment used: Rolling walker (2 wheels) Transfers: Sit to/from Stand;Bed to chair/wheelchair/BSC Sit to Stand: Min assist;Min guard Stand pivot transfers: Min assist         General transfer comment: cues for hand placement and MIN A for lowering safely to seat    Ambulation/Gait Ambulation/Gait assistance: Min guard;+2 safety/equipment Gait Distance (Feet): 50 Feet Assistive device: Rolling walker (2 wheels) Gait Pattern/deviations: Decreased step length - right;Decreased step length - left;Trunk flexed       General Gait Details: Slow gait with decreased step length  Stairs            Wheelchair Mobility    Modified Rankin (Stroke Patients Only)       Balance Overall balance assessment: Needs assistance   Sitting balance-Leahy Scale: Good     Standing balance support: During functional activity Standing balance-Leahy Scale: Fair                               Pertinent Vitals/Pain Pain Assessment: Faces Faces Pain Scale: Hurts a little bit Pain Location: Abdomen Pain Descriptors / Indicators: Other (Comment) (bloated) Pain Intervention(s): Monitored during session;Limited activity within patient's tolerance    Home Living Family/patient expects to be discharged to:: Private residence Living Arrangements: Alone Available Help at Discharge: Family;Available 24 hours/day Type of Home: House Home Access: Stairs to enter   [employer] of Steps: 1   Home Layout: One level Home Equipment: Conservation officer, nature (2 wheels) Additional Comments: Just moved into a new house near  family this month.    Prior Function Prior Level of Function : Independent/Modified Independent                     Hand Dominance        Extremity/Trunk Assessment   Upper Extremity Assessment Upper Extremity Assessment: Overall WFL for tasks assessed    Lower Extremity Assessment Lower  Extremity Assessment: Overall WFL for tasks assessed;Generalized weakness       Communication   Communication: No difficulties  Cognition Arousal/Alertness: Awake/alert Behavior During Therapy: WFL for tasks assessed/performed Overall Cognitive Status: Within Functional Limits for tasks assessed                                          General Comments      Exercises     Assessment/Plan    PT Assessment Patient needs continued PT services  PT Problem List Decreased strength;Decreased activity tolerance;Decreased mobility       PT Treatment Interventions DME instruction;Gait training;Functional mobility training;Therapeutic exercise;Therapeutic activities;Balance training    PT Goals (Current goals can be found in the Care Plan section)  Acute Rehab PT Goals Patient Stated Goal: feel better PT Goal Formulation: With patient Time For Goal Achievement: 07/11/21 Potential to Achieve Goals: Good    Frequency Min 3X/week   Barriers to discharge Decreased caregiver support lives alone, but family can assist    Co-evaluation               AM-PAC PT "6 Clicks" Mobility  Outcome Measure Help needed turning from your back to your side while in a flat bed without using bedrails?: A Little Help needed moving from lying on your back to sitting on the side of a flat bed without using bedrails?: A Little Help needed moving to and from a bed to a chair (including a wheelchair)?: A Little Help needed standing up from a chair using your arms (e.g., wheelchair or bedside chair)?: A Little Help needed to walk in hospital room?: A Little Help needed climbing 3-5 steps with a railing? : A Little 6 Click Score: 18    End of Session   Activity Tolerance: Patient limited by fatigue Patient left: in chair;with call bell/phone within reach;with family/visitor present Nurse Communication: Mobility status PT Visit Diagnosis: Muscle weakness (generalized)  (M62.81);Difficulty in walking, not elsewhere classified (R26.2)    Time: 4562-5638 PT Time Calculation (min) (ACUTE ONLY): 20 min   Charges:   PT Evaluation $PT Eval Moderate Complexity: 1 Mod         Lyall Faciane L. Tamala Julian, PT  06/27/2021   [name] 06/27/2021, 12:30 PM

## 2021-06-27 NOTE — Progress Notes (Signed)
PROGRESS NOTE    Paula Peterson  HYI:[id number] DOB: 1940/08/10 DOA: 06/25/2021 PCP: Caryl Bis, MD   Brief Narrative: 80 year old female with ovarian cancer, DVT on Eliquis, hypothyroidism and hypertension status post recent chemo session 3 days prior to admission to hospital admitted with nausea vomiting and abdominal pain found to have small bowel obstruction likely from adhesions.  Patient seen by general surgery and oncology.  Assessment & Plan:   Principal Problem:   SBO (small bowel obstruction) (HCC)  #1 small bowel obstruction-likely secondary to adhesions.  Patient remains on NG tube and n.p.o. with no bowel movements or flatus Encourage out of bed, ambulate. Correct electrolytes as needed Minimize narcotics IV Tylenol for pain control  #2 ovarian cancer currently on chemotherapy received chemo 3 days prior to admission to hospital  #3 history of DVT on Eliquis since patient has been n.p.o. patient is on heparin drip.  #4 history of essential hypertension blood pressure stable 136/58  #5 GERD continue symptomatic treatment  #6 hypomagnesemia magnesium 1.5 replete and recheck in a.m.   Estimated body mass index is 33.67 kg/m as calculated from the following:   Height as of this encounter: 5\' 7"  (1.702 m).   Weight as of this encounter: 97.5 kg.  DVT prophylaxis: Heparin patient on Eliquis at home she is n.p.o. code Status: DNR  family Communication: Granddaughter at bedside  disposition Plan:  Status is: Inpatient  Remains inpatient appropriate because: NG tube IV fluids   Consultants:  General surgery and oncology  Procedures: NG tube Antimicrobials: None  Subjective: Patient is resting in bed appears weak chronically ill looking and frail NG tube in place draining quite a bit No flatus no BM  Objective: Vitals:   06/27/21 0115 06/27/21 0510 06/27/21 1002 06/27/21 1341  BP: (!) 108/54 (!) 99/43 (!) 109/52 (!) 136/58  Pulse: 70 73 72 73  Resp:  14 16 16 16   Temp: 97.8 F (36.6 C) 97.8 F (36.6 C) 97.7 F (36.5 C) 97.6 F (36.4 C)  TempSrc: Oral Oral Oral Oral  SpO2: 91% 91% 92% 94%  Weight:      Height:        Intake/Output Summary (Last 24 hours) at 06/27/2021 1359 Last data filed at 06/27/2021 1343 Gross per 24 hour  Intake 1615.01 ml  Output 2454 ml  Net -838.99 ml   Filed Weights   06/25/21 2332  Weight: 97.5 kg    Examination:  General exam: Appears calm and comfortable  Respiratory system: Clear to auscultation. Respiratory effort normal. Cardiovascular system: S1 & S2 heard, RRR. No JVD, murmurs, rubs, gallops or clicks. No pedal edema. Gastrointestinal system: Abdomen is nondistended, soft and nontender. No organomegaly or masses felt.  bowel sounds heard. Central nervous system: Alert and oriented. No focal neurological deficits. Extremities: Trace edema Skin: No rashes, lesions or ulcers Psychiatry: Judgement and insight appear normal. Mood & affect appropriate.     Data Reviewed: I have personally reviewed following labs and imaging studies  CBC: Recent Labs  Lab 06/24/21 0817 06/25/21 2334 06/26/21 0700 06/27/21 0501  WBC 3.1* 7.6 5.0 5.2  NEUTROABS 1.5* 5.3  --   --   HGB 9.9* 10.1* 10.1* 9.9*  HCT 29.5* 30.0* 29.7* 28.8*  MCV 106.5* 109.9* 109.2* 108.3*  PLT 139* 159 165 786   Basic Metabolic Panel: Recent Labs  Lab 06/24/21 0817 06/25/21 2334 06/26/21 0700 06/27/21 0501  NA 142 133* 132* 135  K 2.8* 4.0 3.8 4.1  CL 117*  99 98 97*  CO2 21* 25 26 28   GLUCOSE 97 132* 121* 100*  BUN 14 26* 23 20  CREATININE 0.56 0.56 0.60 0.61  CALCIUM 6.4* 9.7 9.4 9.4  MG  --   --  1.5* 1.5*   GFR: Estimated Creatinine Clearance: 69.5 mL/min (by C-G formula based on SCr of 0.61 mg/dL). Liver Function Tests: Recent Labs  Lab 06/24/21 0817 06/25/21 2334 06/26/21 0700 06/27/21 0501  AST 11* 25 22 19   ALT 9 23 21 18   ALKPHOS 54 73 70 71  BILITOT 0.2* 0.8 0.8 0.8  PROT 4.4* 7.3 6.7  6.4*  ALBUMIN 2.6* 3.7 3.5 3.2*   Recent Labs  Lab 06/25/21 2334  LIPASE 21   No results for input(s): AMMONIA in the last 168 hours. Coagulation Profile: No results for input(s): INR, PROTIME in the last 168 hours. Cardiac Enzymes: No results for input(s): CKTOTAL, CKMB, CKMBINDEX, TROPONINI in the last 168 hours. BNP (last 3 results) No results for input(s): PROBNP in the last 8760 hours. HbA1C: No results for input(s): HGBA1C in the last 72 hours. CBG: No results for input(s): GLUCAP in the last 168 hours. Lipid Profile: No results for input(s): CHOL, HDL, LDLCALC, TRIG, CHOLHDL, LDLDIRECT in the last 72 hours. Thyroid Function Tests: No results for input(s): TSH, T4TOTAL, FREET4, T3FREE, THYROIDAB in the last 72 hours. Anemia Panel: No results for input(s): VITAMINB12, FOLATE, FERRITIN, TIBC, IRON, RETICCTPCT in the last 72 hours. Sepsis Labs: No results for input(s): PROCALCITON, LATICACIDVEN in the last 168 hours.  Recent Results (from the past 240 hour(s))  Resp Panel by RT-PCR (Flu A&B, Covid) Nasopharyngeal Swab     Status: None   Collection Time: 06/26/21  2:51 AM   Specimen: Nasopharyngeal Swab; Nasopharyngeal(NP) swabs in vial transport medium  Result Value Ref Range Status   SARS Coronavirus 2 by RT PCR NEGATIVE NEGATIVE Final    Comment: (NOTE) SARS-CoV-2 target nucleic acids are NOT DETECTED.  The SARS-CoV-2 RNA is generally detectable in upper respiratory specimens during the acute phase of infection. The lowest concentration of SARS-CoV-2 viral copies this assay can detect is 138 copies/mL. A negative result does not preclude SARS-Cov-2 infection and should not be used as the sole basis for treatment or other patient management decisions. A negative result may occur with  improper specimen collection/handling, submission of specimen other than nasopharyngeal swab, presence of viral mutation(s) within the areas targeted by this assay, and inadequate number  of viral copies(<138 copies/mL). A negative result must be combined with clinical observations, patient history, and epidemiological information. The expected result is Negative.  Fact Sheet for Patients:  EntrepreneurPulse.com.au  Fact Sheet for Healthcare Providers:  IncredibleEmployment.be  This test is no t yet approved or cleared by the [place] FDA and  has been authorized for detection and/or diagnosis of SARS-CoV-2 by FDA under an Emergency Use Authorization (EUA). This EUA will remain  in effect (meaning this test can be used) for the duration of the COVID-19 declaration under Section 564(b)(1) of the Act, 21 U.S.C.section 360bbb-3(b)(1), unless the authorization is terminated  or revoked sooner.       Influenza A by PCR NEGATIVE NEGATIVE Final   Influenza B by PCR NEGATIVE NEGATIVE Final    Comment: (NOTE) The Xpert Xpress SARS-CoV-2/FLU/RSV plus assay is intended as an aid in the diagnosis of influenza from Nasopharyngeal swab specimens and should not be used as a sole basis for treatment. Nasal washings and aspirates are unacceptable for Xpert Xpress SARS-CoV-2/FLU/RSV  testing.  Fact Sheet for Patients: EntrepreneurPulse.com.au  Fact Sheet for Healthcare Providers: IncredibleEmployment.be  This test is not yet approved or cleared by the [place] FDA and has been authorized for detection and/or diagnosis of SARS-CoV-2 by FDA under an Emergency Use Authorization (EUA). This EUA will remain in effect (meaning this test can be used) for the duration of the COVID-19 declaration under Section 564(b)(1) of the Act, 21 U.S.C. section 360bbb-3(b)(1), unless the authorization is terminated or revoked.  Performed at [facility], [place] [address], [place], [place] [address]          Radiology Studies: CT ABDOMEN PELVIS W CONTRAST  Result Date: 06/26/2021 CLINICAL  DATA:  Abdominal pain. History of right ovarian cancer status post TAH/BSO and complete chemotherapy. EXAM: CT ABDOMEN AND PELVIS WITH CONTRAST TECHNIQUE: Multidetector CT imaging of the abdomen and pelvis was performed using the standard protocol following bolus administration of intravenous contrast. CONTRAST:  39mL OMNIPAQUE IOHEXOL 350 MG/ML SOLN COMPARISON:  CT abdomen pelvis dated 06/13/2021. FINDINGS: Lower chest: Bibasilar linear atelectasis/scarring. The visualized lung bases are otherwise clear. Coronary vascular calcifications. No intra-abdominal free air.  Small free fluid within the pelvis. Hepatobiliary: No focal liver abnormality is seen. No gallstones, gallbladder wall thickening, or biliary dilatation. Pancreas: Unremarkable. No pancreatic ductal dilatation or surrounding inflammatory changes. Spleen: Normal in size without focal abnormality. Adrenals/Urinary Tract: The adrenal glands are unremarkable. There is mild bilateral renal parenchyma atrophy. There is no hydronephrosis on either side. There is symmetric enhancement and excretion of contrast by both kidneys. Small bilateral parapelvic cysts. The visualized ureters appear unremarkable. The urinary bladder is collapsed. Stomach/Bowel: Multiple mildly dilated and fecalized small bowel loops in the lower abdomen noted measuring up to 3 cm. There is thickened and inflamed appearance of distal small bowel within the pelvis. A transition is noted in the right lower quadrant (58/2 and 66/4). Findings most consistent with developing small-bowel obstruction with possible associated enteritis. There is abutment of loops of small bowel to the anterior peritoneal wall consistent with adhesions. There is a stone at the base of the appendix. No inflammatory changes. Vascular/Lymphatic: Moderate aortoiliac atherosclerotic disease. The IVC is unremarkable. No portal venous gas. There is no adenopathy. Reproductive: Hysterectomy. No adnexal masses. A pessary  is noted in the pelvis. Other: Midline vertical anterior pelvic wall incisional scar. Musculoskeletal: Osteopenia with degenerative changes of the spine. No acute osseous pathology. IMPRESSION: 1. Findings most consistent with developing small-bowel obstruction with transition in the right lower quadrant. Possible associated enteritis. 2. Aortic Atherosclerosis (ICD10-I70.0). Electronically Signed   By: Anner Crete M.D.   On: 06/26/2021 02:43   DG Abd Portable 1V-Small Bowel Obstruction Protocol-initial, 8 hr delay  Result Date: 06/26/2021 CLINICAL DATA:  Small bowel protocol.  8 hours post-contrast film. EXAM: PORTABLE ABDOMEN - 1 VIEW COMPARISON:  June 26, 2021 (9:03 a.m.) FINDINGS: A nasogastric tube is seen with its distal tip noted within the body of the stomach. The distal side hole sits approximately 4.6 cm distal to the gastroesophageal junction. A paucity of bowel gas is noted throughout the abdomen. Radiopaque contrast is not clearly identified within the large or small bowel and may be markedly diluted. No radio-opaque calculi or other significant radiographic abnormality are seen. IMPRESSION: 1. Nasogastric tube positioning, as described above. 2. Radiopaque contrast is not clearly identified within the large or small bowel and may be markedly diluted. Electronically Signed   By: Virgina Norfolk M.D.   On: 06/26/2021 22:12  DG Abd Portable 1V-Small Bowel Protocol-Position Verification  Result Date: 06/26/2021 CLINICAL DATA:  Nasogastric tube placement. EXAM: PORTABLE ABDOMEN - 1 VIEW COMPARISON:  Abdominal CT 06/26/2021. FINDINGS: 0903 hours. Enteric tube projects below the diaphragm, tip at the level of the mid stomach. The tube is mildly kinked at its side hole. A right-sided Port-A-Cath tip projects to the level of the superior cavoatrial junction. Mild atelectasis is present at both lung bases. The visualized bowel gas pattern is normal. IMPRESSION: Enteric tube projects over  the mid stomach, slightly kinked at its side hole. Electronically Signed   By: Richardean Sale M.D.   On: 06/26/2021 10:54        Scheduled Meds:  lip balm  1 application Topical BID   Continuous Infusions:  acetaminophen 1,000 mg (06/27/21 0911)   heparin 1,400 Units/hr (06/27/21 0830)   lactated ringers     lactated ringers 75 mL/hr at 06/27/21 0829   methocarbamol (ROBAXIN) IV Stopped (06/26/21 1146)   ondansetron (ZOFRAN) IV       LOS: 1 day    Time spent: 39 minutes  Georgette Shell, MD 06/27/2021, 1:59 PM

## 2021-06-27 NOTE — Progress Notes (Signed)
ANTICOAGULATION CONSULT NOTE   Pharmacy Consult for heparin Indication: hx pulmonary embolus and DVT (home Eliquis on hold)  No Known Allergies  Patient Measurements: Height: 5\' 7"  (170.2 cm) Weight: 97.5 kg (215 lb) IBW/kg (Calculated) : 61.6 Heparin Dosing Weight: 83 kg  Vital Signs: Temp: 97.8 F (36.6 C) (12/30 0510) Temp Source: Oral (12/30 0510) BP: 99/43 (12/30 0510) Pulse Rate: 73 (12/30 0510)  Labs: Recent Labs    06/24/21 0817 06/24/21 0817 06/25/21 2334 06/26/21 0700 06/26/21 1028 06/26/21 1907 06/27/21 0501  HGB 9.9*   < > 10.1* 10.1*  --   --  9.9*  HCT 29.5*  --  30.0* 29.7*  --   --  28.8*  PLT 139*  --  159 165  --   --  164  APTT  --   --   --   --  26 53* 79*  HEPARINUNFRC  --   --   --   --  >1.10* 1.10* 1.07*  CREATININE 0.56  --  0.56 0.60  --   --   --    < > = values in this interval not displayed.     Estimated Creatinine Clearance: 69.5 mL/min (by C-G formula based on SCr of 0.6 mg/dL).   Medications:  - on Eliquis 5mg  bid PTA. Pt's daughter reported that her last dose taken PTA was on 06/25/21 at 0600  Assessment: Patient is a 80 y.o F with ovarian cancer currently undergoing chemotherapy treatment and a hx DVT/PE in 2020, presented to the ED on 06/25/21 with c/o abdominal pain and n/v.  Abdominal CT on 12/29 showed findings consistent with SBO. Patient's now NPO. Pharmacy has been consulted to transition patient to heparin drip.  Today, 06/27/2021 aPTT 79, therapeutic on heparin 1400 units/hr Heparin level 1.07, elevated as expected with recent Eliquis use Hgb 9.9, plts WNL No bleeding or infusion issues documented   Goal of Therapy:  Heparin level 0.3-0.7 units/ml aPTT 66-102  seconds Monitor platelets by anticoagulation protocol: Yes   Plan:  - continue heparin drip at 1400 units/hr - confirmatory aPTT in 8 hours - monitor for s/sx bleeding  Dolly Rias RPh 06/27/2021, 5:35 AM

## 2021-06-28 ENCOUNTER — Inpatient Hospital Stay (HOSPITAL_COMMUNITY): Payer: Medicare Other

## 2021-06-28 DIAGNOSIS — K56609 Unspecified intestinal obstruction, unspecified as to partial versus complete obstruction: Secondary | ICD-10-CM | POA: Diagnosis not present

## 2021-06-28 LAB — COMPREHENSIVE METABOLIC PANEL
ALT: 16 U/L (ref 0–44)
AST: 19 U/L (ref 15–41)
Albumin: 3.1 g/dL — ABNORMAL LOW (ref 3.5–5.0)
Alkaline Phosphatase: 70 U/L (ref 38–126)
Anion gap: 12 (ref 5–15)
BUN: 14 mg/dL (ref 8–23)
CO2: 30 mmol/L (ref 22–32)
Calcium: 9.4 mg/dL (ref 8.9–10.3)
Chloride: 96 mmol/L — ABNORMAL LOW (ref 98–111)
Creatinine, Ser: 0.5 mg/dL (ref 0.44–1.00)
GFR, Estimated: >60 mL/min (ref >60)
Glucose, Bld: 98 mg/dL (ref 70–99)
Potassium: 3.8 mmol/L (ref 3.5–5.1)
Sodium: 138 mmol/L (ref 135–145)
Total Bilirubin: 1 mg/dL (ref 0.3–1.2)
Total Protein: 6.3 g/dL — ABNORMAL LOW (ref 6.5–8.1)

## 2021-06-28 LAB — HEPARIN LEVEL (UNFRACTIONATED)
Heparin Unfractionated: 0.78 IU/mL — ABNORMAL HIGH (ref 0.30–0.70)
Heparin Unfractionated: 0.65 IU/mL (ref 0.30–0.70)

## 2021-06-28 LAB — CBC
HCT: 29.6 % — ABNORMAL LOW (ref 36.0–46.0)
Hemoglobin: 9.8 g/dL — ABNORMAL LOW (ref 12.0–15.0)
MCH: 36 pg — ABNORMAL HIGH (ref 26.0–34.0)
MCHC: 33.1 g/dL (ref 30.0–36.0)
MCV: 108.8 fL — ABNORMAL HIGH (ref 80.0–100.0)
Platelets: 160 10*3/uL (ref 150–400)
RBC: 2.72 MIL/uL — ABNORMAL LOW (ref 3.87–5.11)
RDW: 17.6 % — ABNORMAL HIGH (ref 11.5–15.5)
WBC: 3.8 10*3/uL — ABNORMAL LOW (ref 4.0–10.5)
nRBC: 0.5 % — ABNORMAL HIGH (ref 0.0–0.2)

## 2021-06-28 LAB — MAGNESIUM: Magnesium: 1.7 mg/dL (ref 1.7–2.4)

## 2021-06-28 LAB — APTT: aPTT: 113 seconds — ABNORMAL HIGH (ref 24–36)

## 2021-06-28 MED ORDER — SODIUM CHLORIDE 0.9 % IV SOLN: INTRAVENOUS | Status: DC

## 2021-06-28 MED ORDER — LACTATED RINGERS IV BOLUS: 1000.0000 mL | Freq: Three times a day (TID) | INTRAVENOUS | Status: AC | PRN

## 2021-06-28 MED ORDER — ACETAMINOPHEN 650 MG RE SUPP
650.0000 mg | Freq: Four times a day (QID) | RECTAL | Status: DC | PRN
  Administered 2021-06-29: 650 mg via RECTAL

## 2021-06-28 MED ORDER — SIMETHICONE 40 MG/0.6ML PO SUSP
80.0000 mg | Freq: Four times a day (QID) | ORAL | Status: DC | PRN
  Filled 2021-06-28: qty 1.2

## 2021-06-28 NOTE — Progress Notes (Signed)
HEMATOLOGY-ONCOLOGY PROGRESS NOTE  SUBJECTIVE: Recurrent ovarian cancer status postchemotherapy with doxorubicin and carboplatin, admitted with partial small bowel obstruction. Patient tells me that she is feeling slightly better.  Did not have any nausea or vomiting.  Denies any fevers or chills.  OBJECTIVE: REVIEW OF SYSTEMS:   Generalized fatigue and weakness. NG tube in place.  PHYSICAL EXAMINATION: ECOG PERFORMANCE STATUS: 3 - Symptomatic, >50% confined to bed  Vitals:   06/27/21 2016 06/28/21 0622  BP: (!) 120/47 121/61  Pulse: 78 73  Resp: 16 16  Temp: 97.7 F (36.5 C) (!) 97.4 F (36.3 C)  SpO2: 94% 93%   Filed Weights   06/25/21 2332  Weight: 215 lb (97.5 kg)   Abdomen: NG tube in place, not distended nontender  LABORATORY DATA:  I have reviewed the data as listed CMP Latest Ref Rng & Units 06/28/2021 06/27/2021 06/26/2021  Glucose 70 - 99 mg/dL 98 100(H) 121(H)  BUN 8 - 23 mg/dL 14 20 23   Creatinine 0.44 - 1.00 mg/dL 0.50 0.61 0.60  Sodium 135 - 145 mmol/L 138 135 132(L)  Potassium 3.5 - 5.1 mmol/L 3.8 4.1 3.8  Chloride 98 - 111 mmol/L 96(L) 97(L) 98  CO2 22 - 32 mmol/L 30 28 26   Calcium 8.9 - 10.3 mg/dL 9.4 9.4 9.4  Total Protein 6.5 - 8.1 g/dL 6.3(L) 6.4(L) 6.7  Total Bilirubin 0.3 - 1.2 mg/dL 1.0 0.8 0.8  Alkaline Phos 38 - 126 U/L 70 71 70  AST 15 - 41 U/L 19 19 22   ALT 0 - 44 U/L 16 18 21     Lab Results  Component Value Date   WBC 3.8 (L) 06/28/2021   HGB 9.8 (L) 06/28/2021   HCT 29.6 (L) 06/28/2021   MCV 108.8 (H) 06/28/2021   PLT 160 06/28/2021   NEUTROABS 5.3 06/25/2021    ASSESSMENT AND PLAN: 1.  Partial small bowel obstruction: Dr. Marlou Starks is monitoring her and providing her with conservative treatment with bowel rest. 2. recurrent ovarian cancer status postchemotherapy with doxorubicin and carboplatin.  Last treatment was given on 06/24/2021 Counts may start to come down by next Tuesday or so. I reviewed the blood counts with the patient  and they appear to be stable at this time. Please do not hesitate to call us if there are any further oncologic questions or concerns.

## 2021-06-28 NOTE — Progress Notes (Signed)
PROGRESS NOTE    Paula Peterson  IRC:[id number] DOB: [birth year] DOA: 06/25/2021 PCP: Caryl Bis, MD   Brief Narrative: 80 year old female with ovarian cancer, DVT on Eliquis, hypothyroidism and hypertension status post recent chemo session 3 days prior to admission to hospital admitted with nausea vomiting and abdominal pain found to have small bowel obstruction likely from adhesions.  Patient seen by general surgery and oncology.  Assessment & Plan:   Principal Problem:   SBO (small bowel obstruction) (HCC)  #1 small bowel obstruction-likely secondary to adhesions.  Patient remains on NG tube and n.p.o.  She reports having flatus has not had a BM.   Encourage out of bed, ambulate. Correct electrolytes as needed mag was low yesterday which has been repleted.  Added mag to previous lab draw that was done earlier today. Minimize narcotics IV Tylenol for pain control Follow-up KUB today  #2 ovarian cancer currently on chemotherapy received chemo 3 days prior to admission to hospital  #3 history of DVT on Eliquis since patient has been n.p.o. patient is on heparin drip.  #4 history of essential hypertension blood pressure stable 121/61 she is not on any antihypertensives.  We will start normal saline at 75 cc an hour and monitor closely.  #5 GERD continue symptomatic treatment  #6 hypomagnesemia magnesium 1.5 replete and recheck in a.m.   Estimated body mass index is 33.67 kg/m as calculated from the following:   Height as of this encounter: 5\' 7"  (1.702 m).   Weight as of this encounter: 97.5 kg.  DVT prophylaxis: Heparin patient on Eliquis at home she is n.p.o. code Status: DNR  family Communication: Granddaughter at bedside  disposition Plan:  Status is: Inpatient  Remains inpatient appropriate because: NG tube IV fluids   Consultants:  General surgery and oncology  Procedures: NG tube Antimicrobials: None  Subjective: Patient resting in bed her daughter-in-law  is at the bedside she is awake and alert she still has NG tube in place with no bowel movements however she has had some flatus  Continues to complain of a headache likely from the NG tube objective: Vitals:   06/27/21 1002 06/27/21 1341 06/27/21 2016 06/28/21 0622  BP: (!) 109/52 (!) 136/58 (!) 120/47 121/61  Pulse: 72 73 78 73  Resp: 16 16 16 16   Temp: 97.7 F (36.5 C) 97.6 F (36.4 C) 97.7 F (36.5 C) (!) 97.4 F (36.3 C)  TempSrc: Oral Oral Oral Oral  SpO2: 92% 94% 94% 93%  Weight:      Height:        Intake/Output Summary (Last 24 hours) at 06/28/2021 1215 Last data filed at 06/28/2021 0800 Gross per 24 hour  Intake 1821.66 ml  Output 2750 ml  Net -928.34 ml    Filed Weights   06/25/21 2332  Weight: 97.5 kg    Examination:  General exam: Appears in mild distress Respiratory system: Clear to auscultation. Respiratory effort normal. Cardiovascular system: S1 & S2 heard, RRR. No JVD, murmurs, rubs, gallops or clicks. No pedal edema. Gastrointestinal system: Abdomen is nondistended, soft and nontender. No organomegaly or masses felt.  bowel sounds heard. Central nervous system: Alert and oriented. No focal neurological deficits. Extremities: Trace edema Skin: No rashes, lesions or ulcers Psychiatry: Judgement and insight appear normal. Mood & affect appropriate.     Data Reviewed: I have personally reviewed following labs and imaging studies  CBC: Recent Labs  Lab 06/24/21 0817 06/25/21 2334 06/26/21 0700 06/27/21 0501 06/28/21 0415  WBC  3.1* 7.6 5.0 5.2 3.8*  NEUTROABS 1.5* 5.3  --   --   --   HGB 9.9* 10.1* 10.1* 9.9* 9.8*  HCT 29.5* 30.0* 29.7* 28.8* 29.6*  MCV 106.5* 109.9* 109.2* 108.3* 108.8*  PLT 139* 159 165 164 272    Basic Metabolic Panel: Recent Labs  Lab 06/24/21 0817 06/25/21 2334 06/26/21 0700 06/27/21 0501 06/28/21 0415  NA 142 133* 132* 135 138  K 2.8* 4.0 3.8 4.1 3.8  CL 117* 99 98 97* 96*  CO2 21* 25 26 28 30   GLUCOSE 97  132* 121* 100* 98  BUN 14 26* 23 20 14   CREATININE 0.56 0.56 0.60 0.61 0.50  CALCIUM 6.4* 9.7 9.4 9.4 9.4  MG  --   --  1.5* 1.5*  --     GFR: Estimated Creatinine Clearance: 69.5 mL/min (by C-G formula based on SCr of 0.5 mg/dL). Liver Function Tests: Recent Labs  Lab 06/24/21 0817 06/25/21 2334 06/26/21 0700 06/27/21 0501 06/28/21 0415  AST 11* 25 22 19 19   ALT 9 23 21 18 16   ALKPHOS 54 73 70 71 70  BILITOT 0.2* 0.8 0.8 0.8 1.0  PROT 4.4* 7.3 6.7 6.4* 6.3*  ALBUMIN 2.6* 3.7 3.5 3.2* 3.1*    Recent Labs  Lab 06/25/21 2334  LIPASE 21    No results for input(s): AMMONIA in the last 168 hours. Coagulation Profile: No results for input(s): INR, PROTIME in the last 168 hours. Cardiac Enzymes: No results for input(s): CKTOTAL, CKMB, CKMBINDEX, TROPONINI in the last 168 hours. BNP (last 3 results) No results for input(s): PROBNP in the last 8760 hours. HbA1C: No results for input(s): HGBA1C in the last 72 hours. CBG: No results for input(s): GLUCAP in the last 168 hours. Lipid Profile: No results for input(s): CHOL, HDL, LDLCALC, TRIG, CHOLHDL, LDLDIRECT in the last 72 hours. Thyroid Function Tests: No results for input(s): TSH, T4TOTAL, FREET4, T3FREE, THYROIDAB in the last 72 hours. Anemia Panel: No results for input(s): VITAMINB12, FOLATE, FERRITIN, TIBC, IRON, RETICCTPCT in the last 72 hours. Sepsis Labs: No results for input(s): PROCALCITON, LATICACIDVEN in the last 168 hours.  Recent Results (from the past 240 hour(s))  Resp Panel by RT-PCR (Flu A&B, Covid) Nasopharyngeal Swab     Status: None   Collection Time: 06/26/21  2:51 AM   Specimen: Nasopharyngeal Swab; Nasopharyngeal(NP) swabs in vial transport medium  Result Value Ref Range Status   SARS Coronavirus 2 by RT PCR NEGATIVE NEGATIVE Final    Comment: (NOTE) SARS-CoV-2 target nucleic acids are NOT DETECTED.  The SARS-CoV-2 RNA is generally detectable in upper respiratory specimens during the acute  phase of infection. The lowest concentration of SARS-CoV-2 viral copies this assay can detect is 138 copies/mL. A negative result does not preclude SARS-Cov-2 infection and should not be used as the sole basis for treatment or other patient management decisions. A negative result may occur with  improper specimen collection/handling, submission of specimen other than nasopharyngeal swab, presence of viral mutation(s) within the areas targeted by this assay, and inadequate number of viral copies(<138 copies/mL). A negative result must be combined with clinical observations, patient history, and epidemiological information. The expected result is Negative.  Fact Sheet for Patients:  EntrepreneurPulse.com.au  Fact Sheet for Healthcare Providers:  IncredibleEmployment.be  This test is no t yet approved or cleared by the [place] FDA and  has been authorized for detection and/or diagnosis of SARS-CoV-2 by FDA under an Emergency Use Authorization (EUA). This EUA will  remain  in effect (meaning this test can be used) for the duration of the COVID-19 declaration under Section 564(b)(1) of the Act, 21 U.S.C.section 360bbb-3(b)(1), unless the authorization is terminated  or revoked sooner.       Influenza A by PCR NEGATIVE NEGATIVE Final   Influenza B by PCR NEGATIVE NEGATIVE Final    Comment: (NOTE) The Xpert Xpress SARS-CoV-2/FLU/RSV plus assay is intended as an aid in the diagnosis of influenza from Nasopharyngeal swab specimens and should not be used as a sole basis for treatment. Nasal washings and aspirates are unacceptable for Xpert Xpress SARS-CoV-2/FLU/RSV testing.  Fact Sheet for Patients: EntrepreneurPulse.com.au  Fact Sheet for Healthcare Providers: IncredibleEmployment.be  This test is not yet approved or cleared by the [place] FDA and has been authorized for detection and/or diagnosis of  SARS-CoV-2 by FDA under an Emergency Use Authorization (EUA). This EUA will remain in effect (meaning this test can be used) for the duration of the COVID-19 declaration under Section 564(b)(1) of the Act, 21 U.S.C. section 360bbb-3(b)(1), unless the authorization is terminated or revoked.  Performed at [facility], [place] [address] [place], [place] [address]           Radiology Studies: DG Abd Portable 1V-Small Bowel Obstruction Protocol-24 hr delay  Result Date: 06/27/2021 CLINICAL DATA:  Twenty-four hour delay images for small bowel obstruction EXAM: PORTABLE ABDOMEN - 1 VIEW COMPARISON:  06/26/2021 FINDINGS: Contrast material is demonstrated in the right colon suggesting no evidence of complete obstruction. Gas-filled mildly dilated mid abdominal small bowel loops are identified. Enteric tube tip in the left upper quadrant consistent with location in the upper stomach. IMPRESSION: Contrast material demonstrated in the right colon suggesting no evidence of complete obstruction. Electronically Signed   By: Lucienne Capers M.D.   On: 06/27/2021 15:22   DG Abd Portable 1V-Small Bowel Obstruction Protocol-initial, 8 hr delay  Result Date: 06/26/2021 CLINICAL DATA:  Small bowel protocol.  8 hours post-contrast film. EXAM: PORTABLE ABDOMEN - 1 VIEW COMPARISON:  June 26, 2021 (9:03 a.m.) FINDINGS: A nasogastric tube is seen with its distal tip noted within the body of the stomach. The distal side hole sits approximately 4.6 cm distal to the gastroesophageal junction. A paucity of bowel gas is noted throughout the abdomen. Radiopaque contrast is not clearly identified within the large or small bowel and may be markedly diluted. No radio-opaque calculi or other significant radiographic abnormality are seen. IMPRESSION: 1. Nasogastric tube positioning, as described above. 2. Radiopaque contrast is not clearly identified within the large or small bowel and may be markedly  diluted. Electronically Signed   By: Virgina Norfolk M.D.   On: 06/26/2021 22:12        Scheduled Meds:  Chlorhexidine Gluconate Cloth  6 each Topical Daily   lip balm  1 application Topical BID   sodium chloride flush  10-40 mL Intracatheter Q12H   Continuous Infusions:  heparin 1,300 Units/hr (06/28/21 0845)   lactated ringers     lactated ringers 75 mL/hr at 06/27/21 2249   methocarbamol (ROBAXIN) IV 1,000 mg (06/28/21 1159)   ondansetron (ZOFRAN) IV       LOS: 2 days    Time spent: 39 minutes  Georgette Shell, MD 06/28/2021, 12:15 PM

## 2021-06-28 NOTE — Progress Notes (Addendum)
ANTICOAGULATION CONSULT NOTE   Pharmacy Consult for heparin Indication: hx pulmonary embolus and DVT (home Eliquis on hold)  No Known Allergies  Patient Measurements: Height: 5\' 7"  (170.2 cm) Weight: 97.5 kg (215 lb) IBW/kg (Calculated) : 61.6 Heparin Dosing Weight: 83 kg  Vital Signs: Temp: 97.4 F (36.3 C) (12/31 0622) Temp Source: Oral (12/31 0622) BP: 121/61 (12/31 0622) Pulse Rate: 73 (12/31 0622)  Labs: Recent Labs    06/26/21 0700 06/26/21 1028 06/26/21 1907 06/27/21 0501 06/27/21 1636 06/28/21 0415  HGB 10.1*  --   --  9.9*  --  9.8*  HCT 29.7*  --   --  28.8*  --  29.6*  PLT 165  --   --  164  --  160  APTT  --    < > 53* 79* 84* 113*  HEPARINUNFRC  --    < > 1.10* 1.07*  --  0.78*  CREATININE 0.60  --   --  0.61  --  0.50   < > = values in this interval not displayed.     Estimated Creatinine Clearance: 69.5 mL/min (by C-G formula based on SCr of 0.5 mg/dL).   Medications:  - on Eliquis 5mg  bid PTA. Pt's daughter reported that her last dose taken PTA was on 06/25/21 at 0600  Assessment: Patient is a 80 y.o F with ovarian cancer currently undergoing chemotherapy treatment and a hx DVT/PE in 2020, presented to the ED on 06/25/21 with c/o abdominal pain and n/v.  Abdominal CT on 12/29 showed findings consistent with SBO. Patient's now NPO. Pharmacy has been consulted to transition patient to heparin drip.  Today, 06/28/2021 Confirmatory aPTT 113, supra-therapeutic on heparin 1400 units/hr, HL also supra-therapeutic at 0.78 Hgb 9.8-stable, plts WNL No bleeding or infusion issues documented   Goal of Therapy:  Heparin level 0.3-0.7 units/ml aPTT 66-102  seconds Monitor platelets by anticoagulation protocol: Yes   Plan:  Decrease heparin infusion rate to 1300 units/hr Recheck HL in 8 hours Daily CBC/HL  Ulice Dash, PharmD 06/28/2021 8:36 AM  Addedum:   Heparin level at goal. No complications noted. Will recheck with AM labs.   Ulice Dash, PharmD

## 2021-06-28 NOTE — Progress Notes (Signed)
Subjective/Chief Complaint: Still complains of feeling a little bloated   Objective: Vital signs in last 24 hours: Temp:  [97.4 F (36.3 C)-97.7 F (36.5 C)] 97.4 F (36.3 C) (12/31 0622) Pulse Rate:  [72-78] 73 (12/31 0622) Resp:  [16] 16 (12/31 0622) BP: (109-136)/(47-61) 121/61 (12/31 0622) SpO2:  [92 %-94 %] 93 % (12/31 0622) Last BM Date: 06/25/21  Intake/Output from previous day: 12/30 0701 - 12/31 0700 In: 2392.4 [I.V.:1993.8; IV Piggyback:398.6] Out: 2750 [Urine:2350; Emesis/NG output:400] Intake/Output this shift: Total I/O In: -  Out: 300 [Urine:300]  General appearance: alert and cooperative Resp: clear to auscultation bilaterally Cardio: regular rate and rhythm GI: soft, nondistended. Minimal tenderness  Lab Results:  Recent Labs    06/27/21 0501 06/28/21 0415  WBC 5.2 3.8*  HGB 9.9* 9.8*  HCT 28.8* 29.6*  PLT 164 160   BMET Recent Labs    06/27/21 0501 06/28/21 0415  NA 135 138  K 4.1 3.8  CL 97* 96*  CO2 28 30  GLUCOSE 100* 98  BUN 20 14  CREATININE 0.61 0.50  CALCIUM 9.4 9.4   PT/INR No results for input(s): LABPROT, INR in the last 72 hours. ABG No results for input(s): PHART, HCO3 in the last 72 hours.  Invalid input(s): PCO2, PO2  Studies/Results: DG Abd Portable 1V-Small Bowel Obstruction Protocol-24 hr delay  Result Date: 06/27/2021 CLINICAL DATA:  Twenty-four hour delay images for small bowel obstruction EXAM: PORTABLE ABDOMEN - 1 VIEW COMPARISON:  06/26/2021 FINDINGS: Contrast material is demonstrated in the right colon suggesting no evidence of complete obstruction. Gas-filled mildly dilated mid abdominal small bowel loops are identified. Enteric tube tip in the left upper quadrant consistent with location in the upper stomach. IMPRESSION: Contrast material demonstrated in the right colon suggesting no evidence of complete obstruction. Electronically Signed   By: Lucienne Capers M.D.   On: 06/27/2021 15:22   DG Abd  Portable 1V-Small Bowel Obstruction Protocol-initial, 8 hr delay  Result Date: 06/26/2021 CLINICAL DATA:  Small bowel protocol.  8 hours post-contrast film. EXAM: PORTABLE ABDOMEN - 1 VIEW COMPARISON:  June 26, 2021 (9:03 a.m.) FINDINGS: A nasogastric tube is seen with its distal tip noted within the body of the stomach. The distal side hole sits approximately 4.6 cm distal to the gastroesophageal junction. A paucity of bowel gas is noted throughout the abdomen. Radiopaque contrast is not clearly identified within the large or small bowel and may be markedly diluted. No radio-opaque calculi or other significant radiographic abnormality are seen. IMPRESSION: 1. Nasogastric tube positioning, as described above. 2. Radiopaque contrast is not clearly identified within the large or small bowel and may be markedly diluted. Electronically Signed   By: Virgina Norfolk M.D.   On: 06/26/2021 22:12   DG Abd Portable 1V-Small Bowel Protocol-Position Verification  Result Date: 06/26/2021 CLINICAL DATA:  Nasogastric tube placement. EXAM: PORTABLE ABDOMEN - 1 VIEW COMPARISON:  Abdominal CT 06/26/2021. FINDINGS: 0903 hours. Enteric tube projects below the diaphragm, tip at the level of the mid stomach. The tube is mildly kinked at its side hole. A right-sided Port-A-Cath tip projects to the level of the superior cavoatrial junction. Mild atelectasis is present at both lung bases. The visualized bowel gas pattern is normal. IMPRESSION: Enteric tube projects over the mid stomach, slightly kinked at its side hole. Electronically Signed   By: Richardean Sale M.D.   On: 06/26/2021 10:54    Anti-infectives: Anti-infectives (From admission, onward)    None  Assessment/Plan: s/p * No surgery found * Advance diet. Allow clears Sbo. Contrast in colon on abd xrays from yesterday afternoon. Will try clamping ng SBO - CT scan shows developing small-bowel obstruction with transition in the right lower  quadrant - WBC 5.2, VSS - Recommend bowel rest with NPO and NG tube to LIWS. SBO protocol started yesterday - 8h delay film without passage of contrast to colon, 24h delay should be around 1400 today  - mobilize as tolerated, PT consult ordered - K 4.1 this AM, Mg 1.5 - ordered IV Mg replacement - hopefully this will resolve without surgical intervention, if it does not improve with conservative management would need to consider ex-lap vs discussion of venting g-tube. Surgery would be higher risk in setting of active chemo.    ID - none VTE - on heparin gtt FEN - IVF, NPO/NGT to LIWS Foley - none   Ovarian cancer  -s/p ex lap right salpingo-oophorectomy, omentectomy radical tumor debulking 06/06/2019 by Dr. Denman George - currently undergoing chemotherapy Liposomal Doxorubicin + Carboplatin (last dose 06/24/21) with good response on CT scan from 06/13/21), follow by Dr. Alvy Bimler H/o DVT/PE on eliquis (last dose 12/28 in AM) - hold eliquis Hypothyroidism GERD HTN DNR  LOS: 2 days    Paula Peterson 06/28/2021

## 2021-06-29 DIAGNOSIS — K56609 Unspecified intestinal obstruction, unspecified as to partial versus complete obstruction: Secondary | ICD-10-CM | POA: Diagnosis not present

## 2021-06-29 LAB — COMPREHENSIVE METABOLIC PANEL
ALT: 21 U/L (ref 0–44)
AST: 21 U/L (ref 15–41)
Albumin: 3 g/dL — ABNORMAL LOW (ref 3.5–5.0)
Alkaline Phosphatase: 76 U/L (ref 38–126)
Anion gap: 6 (ref 5–15)
BUN: 11 mg/dL (ref 8–23)
CO2: 27 mmol/L (ref 22–32)
Calcium: 9.3 mg/dL (ref 8.9–10.3)
Chloride: 100 mmol/L (ref 98–111)
Creatinine, Ser: 0.52 mg/dL (ref 0.44–1.00)
GFR, Estimated: >60 mL/min (ref >60)
Glucose, Bld: 104 mg/dL — ABNORMAL HIGH (ref 70–99)
Potassium: 3.8 mmol/L (ref 3.5–5.1)
Sodium: 133 mmol/L — ABNORMAL LOW (ref 135–145)
Total Bilirubin: 1.1 mg/dL (ref 0.3–1.2)
Total Protein: 6.5 g/dL (ref 6.5–8.1)

## 2021-06-29 LAB — CBC
HCT: 28.9 % — ABNORMAL LOW (ref 36.0–46.0)
Hemoglobin: 9.7 g/dL — ABNORMAL LOW (ref 12.0–15.0)
MCH: 36.7 pg — ABNORMAL HIGH (ref 26.0–34.0)
MCHC: 33.6 g/dL (ref 30.0–36.0)
MCV: 109.5 fL — ABNORMAL HIGH (ref 80.0–100.0)
Platelets: 165 10*3/uL (ref 150–400)
RBC: 2.64 MIL/uL — ABNORMAL LOW (ref 3.87–5.11)
RDW: 17.1 % — ABNORMAL HIGH (ref 11.5–15.5)
WBC: 3.9 10*3/uL — ABNORMAL LOW (ref 4.0–10.5)
nRBC: 0 % (ref 0.0–0.2)

## 2021-06-29 LAB — HEPARIN LEVEL (UNFRACTIONATED): Heparin Unfractionated: 0.61 IU/mL (ref 0.30–0.70)

## 2021-06-29 LAB — MAGNESIUM: Magnesium: 1.4 mg/dL — ABNORMAL LOW (ref 1.7–2.4)

## 2021-06-29 MED ORDER — POLYETHYLENE GLYCOL 3350 17 G PO PACK
17.0000 g | PACK | Freq: Every day | ORAL | Status: DC
  Administered 2021-06-30: 17 g via ORAL
  Filled 2021-06-29 (×2): qty 1

## 2021-06-29 MED ORDER — BISACODYL 10 MG RE SUPP
10.0000 mg | Freq: Once | RECTAL | Status: DC
  Filled 2021-06-29: qty 1

## 2021-06-29 MED ORDER — POTASSIUM CHLORIDE 10 MEQ/100ML IV SOLN
10.0000 meq | INTRAVENOUS | Status: AC
  Administered 2021-06-29 (×2): 10 meq via INTRAVENOUS
  Filled 2021-06-29: qty 100

## 2021-06-29 MED ORDER — MAGNESIUM SULFATE 4 GM/100ML IV SOLN
4.0000 g | Freq: Once | INTRAVENOUS | Status: AC
  Administered 2021-06-29: 4 g via INTRAVENOUS
  Filled 2021-06-29: qty 100

## 2021-06-29 MED ORDER — SORBITOL 70 % SOLN
200.0000 mL | TOPICAL_OIL | Freq: Once | ORAL | Status: AC
  Administered 2021-06-29: 200 mL via RECTAL
  Filled 2021-06-29: qty 60

## 2021-06-29 NOTE — Progress Notes (Signed)
° °  Subjective/Chief Complaint: Feels better. Passing flatus. No nausea with ng clamped since yesterday   Objective: Vital signs in last 24 hours: Temp:  [97.6 F (36.4 C)-98 F (36.7 C)] 97.9 F (36.6 C) (01/01 0506) Pulse Rate:  [78-88] 78 (01/01 0506) Resp:  [14-16] 16 (01/01 0506) BP: (113-137)/(57-65) 137/65 (01/01 0506) SpO2:  [93 %-97 %] 96 % (01/01 0506) Last BM Date: 06/25/21  Intake/Output from previous day: 12/31 0701 - 01/01 0700 In: 2842 [P.O.:540; I.V.:2102; IV Piggyback:200] Out: 2050 [Urine:1900; Emesis/NG output:150] Intake/Output this shift: No intake/output data recorded.  General appearance: alert and cooperative Resp: clear to auscultation bilaterally Cardio: regular rate and rhythm GI: soft, nontender. Flat. No distension. Good bs  Lab Results:  Recent Labs    06/28/21 0415 06/29/21 0609  WBC 3.8* 3.9*  HGB 9.8* 9.7*  HCT 29.6* 28.9*  PLT 160 165   BMET Recent Labs    06/28/21 0415 06/29/21 0609  NA 138 133*  K 3.8 3.8  CL 96* 100  CO2 30 27  GLUCOSE 98 104*  BUN 14 11  CREATININE 0.50 0.52  CALCIUM 9.4 9.3   PT/INR No results for input(s): LABPROT, INR in the last 72 hours. ABG No results for input(s): PHART, HCO3 in the last 72 hours.  Invalid input(s): PCO2, PO2  Studies/Results: DG Abd 1 View  Result Date: 06/28/2021 CLINICAL DATA:  Small-bowel obstruction.  Follow-up exam. EXAM: ABDOMEN - 1 VIEW COMPARISON:  06/27/2021 and older exams. FINDINGS: Residual contrast is noted a mostly decompressed colon. Several prominent small bowel loops are noted centrally similar to the prior study. Nasogastric tube is stable and well positioned. IMPRESSION: 1. Prominent central small bowel loops similar to the previous day's exam. Findings suggest a mild residual low-grade small bowel obstruction. Electronically Signed   By: Lajean Manes M.D.   On: 06/28/2021 17:14   DG Abd Portable 1V-Small Bowel Obstruction Protocol-24 hr  delay  Result Date: 06/27/2021 CLINICAL DATA:  Twenty-four hour delay images for small bowel obstruction EXAM: PORTABLE ABDOMEN - 1 VIEW COMPARISON:  06/26/2021 FINDINGS: Contrast material is demonstrated in the right colon suggesting no evidence of complete obstruction. Gas-filled mildly dilated mid abdominal small bowel loops are identified. Enteric tube tip in the left upper quadrant consistent with location in the upper stomach. IMPRESSION: Contrast material demonstrated in the right colon suggesting no evidence of complete obstruction. Electronically Signed   By: Lucienne Capers M.D.   On: 06/27/2021 15:22    Anti-infectives: Anti-infectives (From admission, onward)    None       Assessment/Plan: s/p * No surgery found * Advance diet D/c ng Sbo seems to be resolving  LOS: 3 days    Autumn Messing III 06/29/2021

## 2021-06-29 NOTE — Plan of Care (Signed)
°  Problem: Coping: Goal: Level of anxiety will decrease 06/29/2021 0359 by Blase Mess, RN Outcome: Progressing 06/29/2021 0359 by Blase Mess, RN Outcome: Progressing   Problem: Pain Managment: Goal: General experience of comfort will improve Outcome: Progressing

## 2021-06-29 NOTE — Progress Notes (Signed)
°   06/29/21 1100  Mobility  Head of Bed Elevated  Self regulated  Activity Ambulated in hall  Range of Motion/Exercises Active;All extremities  Level of Assistance Contact guard assist, steadying assist  [place] wheel walker  Minutes Sat in Chair 60 minutes  Distance Ambulated (ft) 200 ft  Mobility Sit up in bed/chair position for meals;Out of bed for toileting;Ambulated with assistance in hallway  Mobility Response Tolerated well  Mobility performed by Mobility specialist;Family member  Bed Position Semi-fowlers  $Mobility charge 1 Mobility    Pt agreeable to mobilize this morning. Ambulated about 242ft in hall with RW, tolerated well. She noted some SOB on exertion, but states this is normal for her. Left pt in chair with call bell at side, and family present. Pt was concerned about her IV leaking as well. Notified RN of session and concern.   [place] Specialist Acute Rehab Services Office: [phone]

## 2021-06-29 NOTE — Progress Notes (Signed)
Patient ambulated a short distance yesterday but had to return to room to have lab work drawn, declined to ambulate this evening or overnight- wanted to get some "much needed rest", states that she will be ready to walk a lot more tomorrow (Sunday), patent remains with NG tube clamped and has been without any nausea/ vomiting, tolerating small amounts of clears.

## 2021-06-29 NOTE — Progress Notes (Signed)
PROGRESS NOTE    Paula Peterson  SVX:[id number] DOB: 03/28/1941 DOA: 06/25/2021 PCP: Caryl Bis, MD   Brief Narrative: 81 year old female with ovarian cancer, DVT on Eliquis, hypothyroidism and hypertension status post recent chemo session 3 days prior to admission to hospital admitted with nausea vomiting and abdominal pain found to have small bowel obstruction likely from adhesions.  Patient seen by general surgery and oncology.  Assessment & Plan:   Principal Problem:   SBO (small bowel obstruction) (HCC)  #1 small bowel obstruction-likely secondary to adhesions.   NG tube was clamped and has had no nausea or vomiting.  However she has not had a bowel movement.  Her mag is still low at 1.4.  Potassium is 3.8.  Will replete electrolytes.  Encourage her to get out of bed today and ambulate more.  She agrees with the plan.  Daughter-in-law present in the room.   MiraLAX suppositories and enema ordered.  #2 recurrent ovarian cancer currently on chemotherapy received chemo  few days prior to admission to hospital  #3 history of DVT on Eliquis since patient has been n.p.o. patient is on heparin drip.  #4 history of essential hypertension blood pressure stable 137/65  she is not on any antihypertensives.    #5 GERD continue symptomatic treatment  #6 hypomagnesemia magnesium 1.4 replete and recheck in a.m.  #7 neutropenia due to recent chemotherapy we will follow closely  Estimated body mass index is 33.67 kg/m as calculated from the following:   Height as of this encounter: 5\' 7"  (1.702 m).   Weight as of this encounter: 97.5 kg.  DVT prophylaxis: Heparin patient on Eliquis at home she is n.p.o. code Status: DNR  family Communication: Granddaughter at bedside  disposition Plan:  Status is: Inpatient  Remains inpatient appropriate because: NG tube IV fluids   Consultants:  General surgery and oncology  Procedures: NG tube Antimicrobials: None  Subjective: Patient is  resting in bed daughter-in-law by the bedside NG tube has been clamped overnight with no nausea vomiting she is having flatus but not had a BM.  objective: Vitals:   06/28/21 0622 06/28/21 1353 06/28/21 2159 06/29/21 0506  BP: 121/61 (!) 117/57 (!) 113/57 137/65  Pulse: 73 82 88 78  Resp: 16 16 14 16   Temp: (!) 97.4 F (36.3 C) 97.6 F (36.4 C) 98 F (36.7 C) 97.9 F (36.6 C)  TempSrc: Oral  Oral Oral  SpO2: 93% 97% 93% 96%  Weight:      Height:        Intake/Output Summary (Last 24 hours) at 06/29/2021 1218 Last data filed at 06/29/2021 1000 Gross per 24 hour  Intake 2500 ml  Output 1750 ml  Net 750 ml    Filed Weights   06/25/21 2332  Weight: 97.5 kg    Examination:  General exam: Appears in mild distress Respiratory system: Clear to auscultation. Respiratory effort normal. Cardiovascular system: S1 & S2 heard, RRR. No JVD, murmurs, rubs, gallops or clicks. No pedal edema. Gastrointestinal system: Abdomen is nondistended, soft and nontender. No organomegaly or masses felt.  bowel sounds heard. Central nervous system: Alert and oriented. No focal neurological deficits. Extremities: Trace edema Skin: No rashes, lesions or ulcers Psychiatry: Judgement and insight appear normal. Mood & affect appropriate.     Data Reviewed: I have personally reviewed following labs and imaging studies  CBC: Recent Labs  Lab 06/24/21 0817 06/25/21 2334 06/26/21 0700 06/27/21 0501 06/28/21 0415 06/29/21 0609  WBC 3.1* 7.6 5.0  5.2 3.8* 3.9*  NEUTROABS 1.5* 5.3  --   --   --   --   HGB 9.9* 10.1* 10.1* 9.9* 9.8* 9.7*  HCT 29.5* 30.0* 29.7* 28.8* 29.6* 28.9*  MCV 106.5* 109.9* 109.2* 108.3* 108.8* 109.5*  PLT 139* 159 165 164 160 355    Basic Metabolic Panel: Recent Labs  Lab 06/25/21 2334 06/26/21 0700 06/27/21 0501 06/28/21 0415 06/28/21 1223 06/29/21 0609  NA 133* 132* 135 138  --  133*  K 4.0 3.8 4.1 3.8  --  3.8  CL 99 98 97* 96*  --  100  CO2 25 26 28 30   --  27   GLUCOSE 132* 121* 100* 98  --  104*  BUN 26* 23 20 14   --  11  CREATININE 0.56 0.60 0.61 0.50  --  0.52  CALCIUM 9.7 9.4 9.4 9.4  --  9.3  MG  --  1.5* 1.5*  --  1.7 1.4*    GFR: Estimated Creatinine Clearance: 69.5 mL/min (by C-G formula based on SCr of 0.52 mg/dL). Liver Function Tests: Recent Labs  Lab 06/25/21 2334 06/26/21 0700 06/27/21 0501 06/28/21 0415 06/29/21 0609  AST 25 22 19 19 21   ALT 23 21 18 16 21   ALKPHOS 73 70 71 70 76  BILITOT 0.8 0.8 0.8 1.0 1.1  PROT 7.3 6.7 6.4* 6.3* 6.5  ALBUMIN 3.7 3.5 3.2* 3.1* 3.0*    Recent Labs  Lab 06/25/21 2334  LIPASE 21    No results for input(s): AMMONIA in the last 168 hours. Coagulation Profile: No results for input(s): INR, PROTIME in the last 168 hours. Cardiac Enzymes: No results for input(s): CKTOTAL, CKMB, CKMBINDEX, TROPONINI in the last 168 hours. BNP (last 3 results) No results for input(s): PROBNP in the last 8760 hours. HbA1C: No results for input(s): HGBA1C in the last 72 hours. CBG: No results for input(s): GLUCAP in the last 168 hours. Lipid Profile: No results for input(s): CHOL, HDL, LDLCALC, TRIG, CHOLHDL, LDLDIRECT in the last 72 hours. Thyroid Function Tests: No results for input(s): TSH, T4TOTAL, FREET4, T3FREE, THYROIDAB in the last 72 hours. Anemia Panel: No results for input(s): VITAMINB12, FOLATE, FERRITIN, TIBC, IRON, RETICCTPCT in the last 72 hours. Sepsis Labs: No results for input(s): PROCALCITON, LATICACIDVEN in the last 168 hours.  Recent Results (from the past 240 hour(s))  Resp Panel by RT-PCR (Flu A&B, Covid) Nasopharyngeal Swab     Status: None   Collection Time: 06/26/21  2:51 AM   Specimen: Nasopharyngeal Swab; Nasopharyngeal(NP) swabs in vial transport medium  Result Value Ref Range Status   SARS Coronavirus 2 by RT PCR NEGATIVE NEGATIVE Final    Comment: (NOTE) SARS-CoV-2 target nucleic acids are NOT DETECTED.  The SARS-CoV-2 RNA is generally detectable in upper  respiratory specimens during the acute phase of infection. The lowest concentration of SARS-CoV-2 viral copies this assay can detect is 138 copies/mL. A negative result does not preclude SARS-Cov-2 infection and should not be used as the sole basis for treatment or other patient management decisions. A negative result may occur with  improper specimen collection/handling, submission of specimen other than nasopharyngeal swab, presence of viral mutation(s) within the areas targeted by this assay, and inadequate number of viral copies(<138 copies/mL). A negative result must be combined with clinical observations, patient history, and epidemiological information. The expected result is Negative.  Fact Sheet for Patients:  EntrepreneurPulse.com.au  Fact Sheet for Healthcare Providers:  IncredibleEmployment.be  This test is no t yet  approved or cleared by the [place] and  has been authorized for detection and/or diagnosis of SARS-CoV-2 by FDA under an Emergency Use Authorization (EUA). This EUA will remain  in effect (meaning this test can be used) for the duration of the COVID-19 declaration under Section 564(b)(1) of the Act, 21 U.S.C.section 360bbb-3(b)(1), unless the authorization is terminated  or revoked sooner.       Influenza A by PCR NEGATIVE NEGATIVE Final   Influenza B by PCR NEGATIVE NEGATIVE Final    Comment: (NOTE) The Xpert Xpress SARS-CoV-2/FLU/RSV plus assay is intended as an aid in the diagnosis of influenza from Nasopharyngeal swab specimens and should not be used as a sole basis for treatment. Nasal washings and aspirates are unacceptable for Xpert Xpress SARS-CoV-2/FLU/RSV testing.  Fact Sheet for Patients: EntrepreneurPulse.com.au  Fact Sheet for Healthcare Providers: IncredibleEmployment.be  This test is not yet approved or cleared by the [place] FDA and has been  authorized for detection and/or diagnosis of SARS-CoV-2 by FDA under an Emergency Use Authorization (EUA). This EUA will remain in effect (meaning this test can be used) for the duration of the COVID-19 declaration under Section 564(b)(1) of the Act, 21 U.S.C. section 360bbb-3(b)(1), unless the authorization is terminated or revoked.  Performed at [facility], [place] [address] [place], [place] [address]           Radiology Studies: DG Abd 1 View  Result Date: 06/28/2021 CLINICAL DATA:  Small-bowel obstruction.  Follow-up exam. EXAM: ABDOMEN - 1 VIEW COMPARISON:  06/27/2021 and older exams. FINDINGS: Residual contrast is noted a mostly decompressed colon. Several prominent small bowel loops are noted centrally similar to the prior study. Nasogastric tube is stable and well positioned. IMPRESSION: 1. Prominent central small bowel loops similar to the previous day's exam. Findings suggest a mild residual low-grade small bowel obstruction. Electronically Signed   By: Lajean Manes M.D.   On: 06/28/2021 17:14   DG Abd Portable 1V-Small Bowel Obstruction Protocol-24 hr delay  Result Date: 06/27/2021 CLINICAL DATA:  Twenty-four hour delay images for small bowel obstruction EXAM: PORTABLE ABDOMEN - 1 VIEW COMPARISON:  06/26/2021 FINDINGS: Contrast material is demonstrated in the right colon suggesting no evidence of complete obstruction. Gas-filled mildly dilated mid abdominal small bowel loops are identified. Enteric tube tip in the left upper quadrant consistent with location in the upper stomach. IMPRESSION: Contrast material demonstrated in the right colon suggesting no evidence of complete obstruction. Electronically Signed   By: Lucienne Capers M.D.   On: 06/27/2021 15:22        Scheduled Meds:  bisacodyl  10 mg Rectal Once   Chlorhexidine Gluconate Cloth  6 each Topical Daily   lip balm  1 application Topical BID   polyethylene glycol  17 g Oral Daily   sodium  chloride flush  10-40 mL Intracatheter Q12H   sorbitol, milk of mag, mineral oil, glycerin (SMOG) enema  200 mL Rectal Once   Continuous Infusions:  sodium chloride 75 mL/hr at 06/28/21 1350   heparin 1,300 Units/hr (06/28/21 2057)   lactated ringers     lactated ringers 75 mL/hr at 06/27/21 2249   methocarbamol (ROBAXIN) IV 1,000 mg (06/28/21 2052)   ondansetron (ZOFRAN) IV       LOS: 3 days    Time spent: 39 minutes  Georgette Shell, MD 06/29/2021, 12:18 PM

## 2021-06-29 NOTE — Progress Notes (Signed)
[facility] for heparin Indication: hx pulmonary embolus and DVT (home Eliquis on hold)  No Known Allergies  Patient Measurements: Height: 5\' 7"  (170.2 cm) Weight: 97.5 kg (215 lb) IBW/kg (Calculated) : 61.6 Heparin Dosing Weight: 83 kg  Vital Signs: Temp: 97.9 F (36.6 C) (01/01 0506) Temp Source: Oral (01/01 0506) BP: 137/65 (01/01 0506) Pulse Rate: 78 (01/01 0506)  Labs: Recent Labs    06/27/21 0501 06/27/21 1636 06/28/21 0415 06/28/21 1654 06/29/21 0609  HGB 9.9*  --  9.8*  --  9.7*  HCT 28.8*  --  29.6*  --  28.9*  PLT 164  --  160  --  165  APTT 79* 84* 113*  --   --   HEPARINUNFRC 1.07*  --  0.78* 0.65 0.61  CREATININE 0.61  --  0.50  --   --      Estimated Creatinine Clearance: 69.5 mL/min (by C-G formula based on SCr of 0.5 mg/dL).   Medications:  - on Eliquis 5mg  bid PTA. Pt's daughter reported that her last dose taken PTA was on 06/25/21 at 0600  Assessment: Patient is a 81 y.o F with ovarian cancer currently undergoing chemotherapy treatment and a hx DVT/PE in 2020, presented to the ED on 06/25/21 with c/o abdominal pain and n/v.  Abdominal CT on 12/29 showed findings consistent with SBO. Patient's now NPO. Pharmacy has been consulted to transition patient to heparin drip.  Today, 06/29/2021 Heparin level therapeutic (0.61) on 1300 units/hr CBC: Hgb 9.7 - stable, plts WNL No bleeding or infusion issues documented  Goal of Therapy:  Heparin level 0.3-0.7 Monitor platelets by anticoagulation protocol: Yes   Plan:  Continue heparin infusion at 1300 units/hr Daily CBC/HL  Peggyann Juba, PharmD, BCPS Pharmacy: [phone] 06/29/2021 7:02 AM

## 2021-06-30 ENCOUNTER — Other Ambulatory Visit: Payer: Self-pay | Admitting: Hematology and Oncology

## 2021-06-30 ENCOUNTER — Inpatient Hospital Stay (HOSPITAL_COMMUNITY): Payer: Medicare Other

## 2021-06-30 DIAGNOSIS — Z0189 Encounter for other specified special examinations: Secondary | ICD-10-CM | POA: Diagnosis not present

## 2021-06-30 DIAGNOSIS — K56609 Unspecified intestinal obstruction, unspecified as to partial versus complete obstruction: Secondary | ICD-10-CM | POA: Diagnosis not present

## 2021-06-30 DIAGNOSIS — I2699 Other pulmonary embolism without acute cor pulmonale: Secondary | ICD-10-CM

## 2021-06-30 DIAGNOSIS — C561 Malignant neoplasm of right ovary: Secondary | ICD-10-CM

## 2021-06-30 LAB — ECHOCARDIOGRAM COMPLETE
AR max vel: 2.3 cm2
AV Area VTI: 2.25 cm2
AV Area mean vel: 2.32 cm2
AV Mean grad: 4.9 mmHg
AV Peak grad: 8.2 mmHg
Ao pk vel: 1.43 m/s
Area-P 1/2: 4.21 cm2
Calc EF: 58.5 %
Height: 67 in
P 1/2 time: 620 msec
S' Lateral: 2.6 cm
Single Plane A2C EF: 57.6 %
Single Plane A4C EF: 57.4 %
Weight: 3440 oz

## 2021-06-30 LAB — COMPREHENSIVE METABOLIC PANEL
ALT: 24 U/L (ref 0–44)
AST: 25 U/L (ref 15–41)
Albumin: 3.2 g/dL — ABNORMAL LOW (ref 3.5–5.0)
Alkaline Phosphatase: 84 U/L (ref 38–126)
Anion gap: 7 (ref 5–15)
BUN: 12 mg/dL (ref 8–23)
CO2: 26 mmol/L (ref 22–32)
Calcium: 9.3 mg/dL (ref 8.9–10.3)
Chloride: 99 mmol/L (ref 98–111)
Creatinine, Ser: 0.71 mg/dL (ref 0.44–1.00)
GFR, Estimated: >60 mL/min (ref >60)
Glucose, Bld: 123 mg/dL — ABNORMAL HIGH (ref 70–99)
Potassium: 3.8 mmol/L (ref 3.5–5.1)
Sodium: 132 mmol/L — ABNORMAL LOW (ref 135–145)
Total Bilirubin: 1.1 mg/dL (ref 0.3–1.2)
Total Protein: 6.5 g/dL (ref 6.5–8.1)

## 2021-06-30 LAB — CBC
HCT: 28.7 % — ABNORMAL LOW (ref 36.0–46.0)
Hemoglobin: 9.8 g/dL — ABNORMAL LOW (ref 12.0–15.0)
MCH: 37.3 pg — ABNORMAL HIGH (ref 26.0–34.0)
MCHC: 34.1 g/dL (ref 30.0–36.0)
MCV: 109.1 fL — ABNORMAL HIGH (ref 80.0–100.0)
Platelets: 168 10*3/uL (ref 150–400)
RBC: 2.63 MIL/uL — ABNORMAL LOW (ref 3.87–5.11)
RDW: 17.2 % — ABNORMAL HIGH (ref 11.5–15.5)
WBC: 3.1 10*3/uL — ABNORMAL LOW (ref 4.0–10.5)
nRBC: 0 % (ref 0.0–0.2)

## 2021-06-30 LAB — MAGNESIUM: Magnesium: 1.7 mg/dL (ref 1.7–2.4)

## 2021-06-30 LAB — BASIC METABOLIC PANEL
Anion gap: 3 — ABNORMAL LOW (ref 5–15)
BUN: 13 mg/dL (ref 8–23)
CO2: 26 mmol/L (ref 22–32)
Calcium: 8.6 mg/dL — ABNORMAL LOW (ref 8.9–10.3)
Chloride: 104 mmol/L (ref 98–111)
Creatinine, Ser: 0.72 mg/dL (ref 0.44–1.00)
GFR, Estimated: >60 mL/min (ref >60)
Glucose, Bld: 144 mg/dL — ABNORMAL HIGH (ref 70–99)
Potassium: 3.7 mmol/L (ref 3.5–5.1)
Sodium: 133 mmol/L — ABNORMAL LOW (ref 135–145)

## 2021-06-30 LAB — HEPARIN LEVEL (UNFRACTIONATED): Heparin Unfractionated: 0.41 IU/mL (ref 0.30–0.70)

## 2021-06-30 MED ORDER — ACETAMINOPHEN 325 MG PO TABS
650.0000 mg | ORAL_TABLET | Freq: Four times a day (QID) | ORAL | Status: DC | PRN
  Administered 2021-06-30 – 2021-07-01 (×3): 650 mg via ORAL
  Filled 2021-06-30 (×3): qty 2

## 2021-06-30 MED ORDER — APIXABAN 5 MG PO TABS
5.0000 mg | ORAL_TABLET | Freq: Two times a day (BID) | ORAL | Status: DC
  Administered 2021-06-30 – 2021-07-01 (×3): 5 mg via ORAL
  Filled 2021-06-30 (×3): qty 1

## 2021-06-30 NOTE — Progress Notes (Signed)
° °  Subjective/Chief Complaint: Patient continues to feel better and is having bowel movements. Tolerating liquids and would like to eat more.   Objective: Vital signs in last 24 hours: Temp:  [98.6 F (37 C)-98.7 F (37.1 C)] 98.6 F (37 C) (01/02 0551) Pulse Rate:  [68-76] 68 (01/02 0551) Resp:  [16-17] 16 (01/02 0551) BP: (113-131)/(53-79) 119/61 (01/02 0551) SpO2:  [95 %-96 %] 95 % (01/02 0551) Last BM Date: 06/29/21  Intake/Output from previous day: 01/01 0701 - 01/02 0700 In: 1938 [P.O.:720; I.V.:1018; IV Piggyback:200] Out: 1 [Stool:1] Intake/Output this shift: Total I/O In: 120 [P.O.:120] Out: -   General: resting comfortably, NAD Neuro: alert and oriented, no focal deficits Resp: normal work of breathing Abdomen: soft, nondistended, nontender to palpation. Extremities: warm and well-perfused   Lab Results:  Recent Labs    06/29/21 0609 06/30/21 0649  WBC 3.9* 3.1*  HGB 9.7* 9.8*  HCT 28.9* 28.7*  PLT 165 168   BMET Recent Labs    06/29/21 0609 06/30/21 0649  NA 133* 132*  K 3.8 3.8  CL 100 99  CO2 27 26  GLUCOSE 104* 123*  BUN 11 12  CREATININE 0.52 0.71  CALCIUM 9.3 9.3   PT/INR No results for input(s): LABPROT, INR in the last 72 hours. ABG No results for input(s): PHART, HCO3 in the last 72 hours.  Invalid input(s): PCO2, PO2  Studies/Results: DG Abd 1 View  Result Date: 06/28/2021 CLINICAL DATA:  Small-bowel obstruction.  Follow-up exam. EXAM: ABDOMEN - 1 VIEW COMPARISON:  06/27/2021 and older exams. FINDINGS: Residual contrast is noted a mostly decompressed colon. Several prominent small bowel loops are noted centrally similar to the prior study. Nasogastric tube is stable and well positioned. IMPRESSION: 1. Prominent central small bowel loops similar to the previous day's exam. Findings suggest a mild residual low-grade small bowel obstruction. Electronically Signed   By: Lajean Manes M.D.   On: 06/28/2021 17:14     Anti-infectives: Anti-infectives (From admission, onward)    None       Assessment/Plan: 81 yo female with SBO, resolving. - Advance to soft diet - Having regular bowel function - Surgery will follow   LOS: 4 days    Dwan Bolt 06/30/2021

## 2021-06-30 NOTE — Progress Notes (Signed)
Physical Therapy Treatment Patient Details Name: Paula Peterson MRN: [id number] DOB: Jul 04, 1940 Today's Date: 06/30/2021   History of Present Illness Paula Peterson is a 81 y.o. female with medical history significant of ovarian cancer, GERD, DVT on eliquis, hypothyroidism, HTN. Presenting with abdominal pain, N/V. She had a chemo session 2 days ago. Yesterday, she didn't feel well. She started having a sharp, global abdominal pain. CT showed SBO.    PT Comments    Pt progressing toward goals. Continues to be lightly reliant on RW for support, reports fatigue but overall activity tolerance improving.  May not need HHPT if progress continues at this pace  Recommendations for follow up therapy are one component of a multi-disciplinary discharge planning process, led by the attending physician.  Recommendations may be updated based on patient status, additional functional criteria and insurance authorization.  Follow Up Recommendations  Home health PT     Assistance Recommended at Discharge Frequent or constant Supervision/Assistance  Equipment Recommendations  None recommended by PT    Recommendations for Other Services       Precautions / Restrictions       Mobility  Bed Mobility Overal bed mobility: Needs Assistance Bed Mobility: Sidelying to Sit   Sidelying to sit: Supervision Supine to sit: Supervision     General bed mobility comments: HOB elevated ~ 40 degrees, supervision for safety/lines    Transfers Overall transfer level: Needs assistance Equipment used: Rolling walker (2 wheels) Transfers: Sit to/from Stand Sit to Stand: Min guard           General transfer comment: cues for hand placement and to control descent    Ambulation/Gait Ambulation/Gait assistance: Supervision;Min guard Gait Distance (Feet): 140 Feet Assistive device: Rolling walker (2 wheels) Gait Pattern/deviations: Step-through pattern;Decreased stride length;Trunk flexed       General  Gait Details: incr stride length today. cues for trunk extension and shouler depression   Stairs             Wheelchair Mobility    Modified Rankin (Stroke Patients Only)       Balance     Sitting balance-Leahy Scale: Good     Standing balance support: During functional activity Standing balance-Leahy Scale: Fair                              Cognition Arousal/Alertness: Awake/alert Behavior During Therapy: WFL for tasks assessed/performed Overall Cognitive Status: Within Functional Limits for tasks assessed                                          Exercises      General Comments        Pertinent Vitals/Pain Pain Assessment: Faces Faces Pain Scale: Hurts a little bit Pain Location: diffuse. hips, back Pain Descriptors / Indicators: Grimacing Pain Intervention(s): Limited activity within patient's tolerance;Monitored during session;Repositioned;Other (comment) (RN checking with MD on pain meds)    Home Living                          Prior Function            PT Goals (current goals can now be found in the care plan section) Acute Rehab PT Goals Patient Stated Goal: feel better PT Goal Formulation: With patient Time For Goal Achievement: 07/11/21  Potential to Achieve Goals: Good Progress towards PT goals: Progressing toward goals    Frequency    Min 3X/week      PT Plan Current plan remains appropriate    Co-evaluation              AM-PAC PT "6 Clicks" Mobility   Outcome Measure  Help needed turning from your back to your side while in a flat bed without using bedrails?: A Little Help needed moving from lying on your back to sitting on the side of a flat bed without using bedrails?: A Little Help needed moving to and from a bed to a chair (including a wheelchair)?: A Little Help needed standing up from a chair using your arms (e.g., wheelchair or bedside chair)?: A Little Help needed to walk  in hospital room?: A Little Help needed climbing 3-5 steps with a railing? : A Little 6 Click Score: 18    End of Session Equipment Utilized During Treatment: Gait belt Activity Tolerance: Patient tolerated treatment well Patient left: in bed;with call bell/phone within reach;with bed alarm set;with family/visitor present Nurse Communication: Mobility status PT Visit Diagnosis: Muscle weakness (generalized) (M62.81);Difficulty in walking, not elsewhere classified (R26.2)     Time: 1215-1227 PT Time Calculation (min) (ACUTE ONLY): 12 min  Charges:  $Gait Training: 8-22 mins                     Baxter Flattery, PT  Acute Rehab Dept ([place]) [phone] Pager [phone]  06/30/2021    [facility] 06/30/2021, 1:01 PM

## 2021-06-30 NOTE — Progress Notes (Signed)
PROGRESS NOTE    Paula Peterson  WCH:[id number] DOB: 12-06-1940 DOA: 06/25/2021 PCP: Caryl Bis, MD   Brief Narrative: 81 year old female with ovarian cancer, DVT on Eliquis, hypothyroidism and hypertension status post recent chemo session 3 days prior to admission to hospital admitted with nausea vomiting and abdominal pain found to have small bowel obstruction likely from adhesions.  Patient seen by general surgery and oncology.  Assessment & Plan:   Principal Problem:   SBO (small bowel obstruction) (HCC)  #1 small bowel obstruction-likely secondary to adhesions.  NG tube has been taken out and she is tolerating p.o. intake and is advancing the diet as tolerated.  Encouraged her to ambulate again today.  Minimize narcotics.  #2 recurrent ovarian cancer currently on chemotherapy received chemo  few days prior to admission to hospital  #3 history of DVT on Eliquis DC heparin drip  #4 history of essential hypertension blood pressure stable 137/65  she is not on any antihypertensives.    #5 GERD continue symptomatic treatment  #6 hypomagnesemia magnesium 1.4 replete and recheck in a.m.  #7 neutropenia due to recent chemotherapy we will follow closely New spiking temp 100.8 noted Blood cultures x2  Estimated body mass index is 33.67 kg/m as calculated from the following:   Height as of this encounter: 5\' 7"  (1.702 m).   Weight as of this encounter: 97.5 kg.  DVT prophylaxis: Heparin patient on Eliquis at home she is n.p.o. code Status: DNR  family Communication: Granddaughter at bedside  disposition Plan:  Status is: Inpatient  Remains inpatient appropriate because: NG tube IV fluids   Consultants:  General surgery and oncology  Procedures: NG tube Antimicrobials: None  Subjective: Patient is resting in bed daughter-in-law by the bedside NG tube has been clamped overnight with no nausea vomiting she is having flatus but not had a BM.  objective: Vitals:    06/29/21 1318 06/29/21 2109 06/30/21 0551 06/30/21 1316  BP: 113/79 (!) 131/53 119/61 (!) 126/59  Pulse: 76 75 68 78  Resp: 17 17 16 16   Temp: 98.6 F (37 C) 98.7 F (37.1 C) 98.6 F (37 C) (!) 100.8 F (38.2 C)  TempSrc:  Oral Oral Oral  SpO2: 96% 95% 95% 98%  Weight:      Height:        Intake/Output Summary (Last 24 hours) at 06/30/2021 1406 Last data filed at 06/30/2021 0930 Gross per 24 hour  Intake 1102 ml  Output --  Net 1102 ml    Filed Weights   06/25/21 2332  Weight: 97.5 kg    Examination:  General exam: Appears in mild distress Respiratory system: Clear to auscultation. Respiratory effort normal. Cardiovascular system: S1 & S2 heard, RRR. No JVD, murmurs, rubs, gallops or clicks. No pedal edema. Gastrointestinal system: Abdomen is nondistended, soft and nontender. No organomegaly or masses felt.  bowel sounds heard. Central nervous system: Alert and oriented. No focal neurological deficits. Extremities: Trace edema Skin: No rashes, lesions or ulcers Psychiatry: Judgement and insight appear normal. Mood & affect appropriate.     Data Reviewed: I have personally reviewed following labs and imaging studies  CBC: Recent Labs  Lab 06/24/21 0817 06/24/21 0817 06/25/21 2334 06/26/21 0700 06/27/21 0501 06/28/21 0415 06/29/21 0609 06/30/21 0649  WBC 3.1*   < > 7.6 5.0 5.2 3.8* 3.9* 3.1*  NEUTROABS 1.5*  --  5.3  --   --   --   --   --   HGB 9.9*   < >  10.1* 10.1* 9.9* 9.8* 9.7* 9.8*  HCT 29.5*  --  30.0* 29.7* 28.8* 29.6* 28.9* 28.7*  MCV 106.5*  --  109.9* 109.2* 108.3* 108.8* 109.5* 109.1*  PLT 139*   < > 159 165 164 160 165 168   < > = values in this interval not displayed.    Basic Metabolic Panel: Recent Labs  Lab 06/26/21 0700 06/27/21 0501 06/28/21 0415 06/28/21 1223 06/29/21 0609 06/30/21 0649  NA 132* 135 138  --  133* 132*  K 3.8 4.1 3.8  --  3.8 3.8  CL 98 97* 96*  --  100 99  CO2 26 28 30   --  27 26  GLUCOSE 121* 100* 98  --  104*  123*  BUN 23 20 14   --  11 12  CREATININE 0.60 0.61 0.50  --  0.52 0.71  CALCIUM 9.4 9.4 9.4  --  9.3 9.3  MG 1.5* 1.5*  --  1.7 1.4* 1.7    GFR: Estimated Creatinine Clearance: 69.5 mL/min (by C-G formula based on SCr of 0.71 mg/dL). Liver Function Tests: Recent Labs  Lab 06/26/21 0700 06/27/21 0501 06/28/21 0415 06/29/21 0609 06/30/21 0649  AST 22 19 19 21 25   ALT 21 18 16 21 24   ALKPHOS 70 71 70 76 84  BILITOT 0.8 0.8 1.0 1.1 1.1  PROT 6.7 6.4* 6.3* 6.5 6.5  ALBUMIN 3.5 3.2* 3.1* 3.0* 3.2*    Recent Labs  Lab 06/25/21 2334  LIPASE 21    No results for input(s): AMMONIA in the last 168 hours. Coagulation Profile: No results for input(s): INR, PROTIME in the last 168 hours. Cardiac Enzymes: No results for input(s): CKTOTAL, CKMB, CKMBINDEX, TROPONINI in the last 168 hours. BNP (last 3 results) No results for input(s): PROBNP in the last 8760 hours. HbA1C: No results for input(s): HGBA1C in the last 72 hours. CBG: No results for input(s): GLUCAP in the last 168 hours. Lipid Profile: No results for input(s): CHOL, HDL, LDLCALC, TRIG, CHOLHDL, LDLDIRECT in the last 72 hours. Thyroid Function Tests: No results for input(s): TSH, T4TOTAL, FREET4, T3FREE, THYROIDAB in the last 72 hours. Anemia Panel: No results for input(s): VITAMINB12, FOLATE, FERRITIN, TIBC, IRON, RETICCTPCT in the last 72 hours. Sepsis Labs: No results for input(s): PROCALCITON, LATICACIDVEN in the last 168 hours.  Recent Results (from the past 240 hour(s))  Resp Panel by RT-PCR (Flu A&B, Covid) Nasopharyngeal Swab     Status: None   Collection Time: 06/26/21  2:51 AM   Specimen: Nasopharyngeal Swab; Nasopharyngeal(NP) swabs in vial transport medium  Result Value Ref Range Status   SARS Coronavirus 2 by RT PCR NEGATIVE NEGATIVE Final    Comment: (NOTE) SARS-CoV-2 target nucleic acids are NOT DETECTED.  The SARS-CoV-2 RNA is generally detectable in upper respiratory specimens during the acute  phase of infection. The lowest concentration of SARS-CoV-2 viral copies this assay can detect is 138 copies/mL. A negative result does not preclude SARS-Cov-2 infection and should not be used as the sole basis for treatment or other patient management decisions. A negative result may occur with  improper specimen collection/handling, submission of specimen other than nasopharyngeal swab, presence of viral mutation(s) within the areas targeted by this assay, and inadequate number of viral copies(<138 copies/mL). A negative result must be combined with clinical observations, patient history, and epidemiological information. The expected result is Negative.  Fact Sheet for Patients:  EntrepreneurPulse.com.au  Fact Sheet for Healthcare Providers:  IncredibleEmployment.be  This test is no t  yet approved or cleared by the [place] and  has been authorized for detection and/or diagnosis of SARS-CoV-2 by FDA under an Emergency Use Authorization (EUA). This EUA will remain  in effect (meaning this test can be used) for the duration of the COVID-19 declaration under Section 564(b)(1) of the Act, 21 U.S.C.section 360bbb-3(b)(1), unless the authorization is terminated  or revoked sooner.       Influenza A by PCR NEGATIVE NEGATIVE Final   Influenza B by PCR NEGATIVE NEGATIVE Final    Comment: (NOTE) The Xpert Xpress SARS-CoV-2/FLU/RSV plus assay is intended as an aid in the diagnosis of influenza from Nasopharyngeal swab specimens and should not be used as a sole basis for treatment. Nasal washings and aspirates are unacceptable for Xpert Xpress SARS-CoV-2/FLU/RSV testing.  Fact Sheet for Patients: EntrepreneurPulse.com.au  Fact Sheet for Healthcare Providers: IncredibleEmployment.be  This test is not yet approved or cleared by the [place] FDA and has been authorized for detection and/or diagnosis of  SARS-CoV-2 by FDA under an Emergency Use Authorization (EUA). This EUA will remain in effect (meaning this test can be used) for the duration of the COVID-19 declaration under Section 564(b)(1) of the Act, 21 U.S.C. section 360bbb-3(b)(1), unless the authorization is terminated or revoked.  Performed at [facility], [place] [address], [place], [place] [address]           Radiology Studies: DG Abd 1 View  Result Date: 06/28/2021 CLINICAL DATA:  Small-bowel obstruction.  Follow-up exam. EXAM: ABDOMEN - 1 VIEW COMPARISON:  06/27/2021 and older exams. FINDINGS: Residual contrast is noted a mostly decompressed colon. Several prominent small bowel loops are noted centrally similar to the prior study. Nasogastric tube is stable and well positioned. IMPRESSION: 1. Prominent central small bowel loops similar to the previous day's exam. Findings suggest a mild residual low-grade small bowel obstruction. Electronically Signed   By: Lajean Manes M.D.   On: 06/28/2021 17:14   ECHOCARDIOGRAM COMPLETE  Result Date: 06/30/2021    ECHOCARDIOGRAM LIMITED REPORT   Patient Name:   JULIANE GUEST Haff Date of Exam: 06/30/2021 Medical Rec #:  [id number]    Height:       67.0 in Accession #:    [id number]   Weight:       215.0 lb Date of Birth:  [birth year]   BSA:          2.085 m Patient Age:    55 years     BP:           119/61 mmHg Patient Gender: F            HR:           78 bpm. Exam Location:  Inpatient Procedure: 2D Echo, Cardiac Doppler, Color Doppler and Strain Analysis Indications:    Chemo  History:        Patient has prior history of Echocardiogram examinations. Risk                 Factors:Hypertension.  Sonographer:    Jyl Heinz Referring Phys: [id number] NI [place]  1. Left ventricular ejection fraction, by estimation, is 60 to 65%. The left ventricle has normal function. The left ventricle has no regional wall motion abnormalities. There is mild left ventricular hypertrophy.  Left ventricular diastolic parameters are consistent with Grade I diastolic dysfunction (impaired relaxation). The average left ventricular global longitudinal strain is -18.2 %. The global longitudinal strain is normal.  2. Right ventricular systolic  function is normal. The right ventricular size is normal. Tricuspid regurgitation signal is inadequate for assessing PA pressure.  3. The mitral valve is normal in structure. Trivial mitral valve regurgitation. No evidence of mitral stenosis.  4. The aortic valve is tricuspid. Aortic valve regurgitation is mild to moderate. No aortic stenosis is present. FINDINGS  Left Ventricle: Left ventricular ejection fraction, by estimation, is 60 to 65%. The left ventricle has normal function. The left ventricle has no regional wall motion abnormalities. The average left ventricular global longitudinal strain is -18.2 %. The global longitudinal strain is normal. The left ventricular internal cavity size was normal in size. There is mild left ventricular hypertrophy. Left ventricular diastolic parameters are consistent with Grade I diastolic dysfunction (impaired relaxation). Normal left ventricular filling pressure. Right Ventricle: The right ventricular size is normal. No increase in right ventricular wall thickness. Right ventricular systolic function is normal. Tricuspid regurgitation signal is inadequate for assessing PA pressure. Left Atrium: Left atrial size was normal in size. Right Atrium: Right atrial size was normal in size. Pericardium: There is no evidence of pericardial effusion. Mitral Valve: The mitral valve is normal in structure. Trivial mitral valve regurgitation. No evidence of mitral valve stenosis. Tricuspid Valve: The tricuspid valve is normal in structure. Tricuspid valve regurgitation is not demonstrated. No evidence of tricuspid stenosis. Aortic Valve: The aortic valve is tricuspid. Aortic valve regurgitation is mild to moderate. Aortic regurgitation PHT  measures 620 msec. No aortic stenosis is present. Aortic valve mean gradient measures 4.9 mmHg. Aortic valve peak gradient measures 8.2 mmHg. Aortic valve area, by VTI measures 2.25 cm. Pulmonic Valve: The pulmonic valve was not well visualized. Pulmonic valve regurgitation is not visualized. No evidence of pulmonic stenosis. Aorta: The aortic root is normal in size and structure. Venous: The inferior vena cava was not well visualized. IAS/Shunts: No atrial level shunt detected by color flow Doppler. LEFT VENTRICLE PLAX 2D LVIDd:         4.20 cm      Diastology LVIDs:         2.60 cm      LV e' medial:    5.87 cm/s LV PW:         1.10 cm      LV E/e' medial:  9.7 LV IVS:        1.20 cm      LV e' lateral:   9.14 cm/s LVOT diam:     2.00 cm      LV E/e' lateral: 6.2 LV SV:         66 LV SV Index:   32           2D Longitudinal Strain LVOT Area:     3.14 cm     2D Strain GLS Avg:     -18.2 %  LV Volumes (MOD) LV vol d, MOD A2C: 85.0 ml  3D Volume EF: LV vol d, MOD A4C: 106.0 ml 3D EF:        59 % LV vol s, MOD A2C: 36.0 ml  LV EDV:       130 ml LV vol s, MOD A4C: 45.2 ml  LV ESV:       53 ml LV SV MOD A2C:     49.0 ml  LV SV:        77 ml LV SV MOD A4C:     106.0 ml LV SV MOD BP:      56.9 ml RIGHT VENTRICLE  IVC RV Basal diam:  3.00 cm     IVC diam: 1.10 cm RV Mid diam:    2.80 cm RV S prime:     11.00 cm/s TAPSE (M-mode): 2.1 cm LEFT ATRIUM             Index        RIGHT ATRIUM           Index LA diam:        3.00 cm 1.44 cm/m   RA Area:     12.00 cm LA Vol (A2C):   37.5 ml 17.98 ml/m  RA Volume:   25.00 ml  11.99 ml/m LA Vol (A4C):   37.0 ml 17.74 ml/m LA Biplane Vol: 40.6 ml 19.47 ml/m  AORTIC VALVE AV Area (Vmax):    2.30 cm AV Area (Vmean):   2.32 cm AV Area (VTI):     2.25 cm AV Vmax:           143.38 cm/s AV Vmean:          102.712 cm/s AV VTI:            0.293 m AV Peak Grad:      8.2 mmHg AV Mean Grad:      4.9 mmHg LVOT Vmax:         105.00 cm/s LVOT Vmean:        75.800 cm/s LVOT VTI:           0.210 m LVOT/AV VTI ratio: 0.72 AI PHT:            620 msec  AORTA Ao Root diam: 2.90 cm Ao Asc diam:  2.80 cm MITRAL VALVE MV Area (PHT): 4.21 cm    SHUNTS MV Decel Time: 180 msec    Systemic VTI:  0.21 m MV E velocity: 56.90 cm/s  Systemic Diam: 2.00 cm MV A velocity: 67.30 cm/s MV E/A ratio:  0.85 Carlyle Dolly MD Electronically signed by Carlyle Dolly MD Signature Date/Time: 06/30/2021/12:53:01 PM    Final         Scheduled Meds:  apixaban  5 mg Oral BID   bisacodyl  10 mg Rectal Once   Chlorhexidine Gluconate Cloth  6 each Topical Daily   lip balm  1 application Topical BID   polyethylene glycol  17 g Oral Daily   sodium chloride flush  10-40 mL Intracatheter Q12H   Continuous Infusions:  sodium chloride 75 mL/hr at 06/30/21 1108   lactated ringers 75 mL/hr at 06/27/21 2249   methocarbamol (ROBAXIN) IV 1,000 mg (06/28/21 2052)   ondansetron (ZOFRAN) IV       LOS: 4 days    Time spent: 39 minutes  Georgette Shell, MD 06/30/2021, 2:06 PM

## 2021-06-30 NOTE — TOC Initial Note (Signed)
Transition of Care [facility]) - Initial/Assessment Note   Patient Details  Name: Paula Peterson MRN: [id number] Date of Birth: 10-15-1940  Transition of [facility]) CM/SW Contact:    Sherie Don, LCSW Phone Number: 06/30/2021, 3:42 PM  Clinical Narrative: HHPT was recommended for patient. CSW spoke with daughter and the patient does not want [place] services at this time.  Expected Discharge Plan: Home/Self Care Barriers to Discharge: Continued Medical Work up  Patient Goals and CMS Choice CMS Medicare.gov Compare Post Acute Care list provided to:: Patient Represenative (must comment) Choice offered to / list presented to : Adult Children  Expected Discharge Plan and Services Expected Discharge Plan: Home/Self Care In-house Referral: Clinical Social Work Post Acute Care Choice: NA Living arrangements for the past 2 months: Single Family Home           DME Arranged: N/A DME Agency: NA  Prior Living Arrangements/Services Living arrangements for the past 2 months: Single Family Home Patient language and need for interpreter reviewed:: Yes Do you feel safe going back to the place where you live?: Yes      Need for Family Participation in Patient Care: Yes (Comment) Care giver support system in place?: Yes (comment) Criminal Activity/Legal Involvement Pertinent to Current Situation/Hospitalization: No - Comment as needed  Activities of Daily Living Home Assistive Devices/Equipment: None ADL Screening (condition at time of admission) Patient's cognitive ability adequate to safely complete daily activities?: Yes Is the patient deaf or have difficulty hearing?: No Does the patient have difficulty seeing, even when wearing glasses/contacts?: No Does the patient have difficulty concentrating, remembering, or making decisions?: No Patient able to express need for assistance with ADLs?: Yes Does the patient have difficulty dressing or bathing?: No Independently performs ADLs?: Yes (appropriate  for developmental age) Does the patient have difficulty walking or climbing stairs?: No Weakness of Legs: Both Weakness of Arms/Hands: Both  Emotional Assessment Alcohol / Substance Use: Not Applicable  Admission diagnosis:  Enteritis [K52.9] Small bowel obstruction ([place]) [K56.609] SBO (small bowel obstruction) ([place]) [K56.609] Patient Active Problem List   Diagnosis Date Noted   SBO (small bowel obstruction) ([place]) 06/26/2021   Anemia due to antineoplastic chemotherapy 02/28/2021   Skin infection 02/14/2021   Ear pain 02/07/2021   Drug-induced neutropenia ([place]) 01/17/2021   Headache disorder 10/14/2020   CKD (chronic kidney disease), stage III ([place]) 10/01/2020   Hand foot syndrome 09/10/2020   Pulmonary embolism ([place]) 07/15/2020   Hypokalemia 07/15/2020   Essential hypertension 07/15/2020   Lower back pain 05/28/2020   Elevated serum creatinine 05/01/2020   Recurrent UTI 04/03/2020   Genetic testing 08/11/2019   Adverse effect of chemotherapy 03/14/2019   Pancytopenia, acquired ([place]) 03/10/2019   Peripheral neuropathy due to chemotherapy ([place]) 03/10/2019   Epigastric pain 02/09/2019   PE (pulmonary thromboembolism) ([place]) 02/02/2019   Chronic hip pain 01/27/2019   Acquired hypothyroidism 01/27/2019   Family history of stomach cancer    Goals of care, counseling/discussion 01/06/2019   Elevated liver enzymes 01/06/2019   Other fatigue 01/06/2019   Other constipation 12/07/2018   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 [place] [address] Phone: [phone] Fax: [phone]  [facility] [address], [address]  [place] [address] Phone: [phone] Fax: [phone]  Readmission Risk  Interventions No flowsheet data found.

## 2021-06-30 NOTE — Progress Notes (Signed)
[place] for apixaban Indication: hx pulmonary embolus and DVT (home Eliquis on hold)  No Known Allergies  Patient Measurements: Height: 5\' 7"  (170.2 cm) Weight: 97.5 kg (215 lb) IBW/kg (Calculated) : 61.6 Heparin Dosing Weight: 83 kg  Vital Signs: Temp: 98.6 F (37 C) (01/02 0551) Temp Source: Oral (01/02 0551) BP: 119/61 (01/02 0551) Pulse Rate: 68 (01/02 0551)  Labs: Recent Labs    06/27/21 1636 06/28/21 0415 06/28/21 0415 06/28/21 1654 06/29/21 0609 06/30/21 0649  HGB  --  9.8*   < >  --  9.7* 9.8*  HCT  --  29.6*  --   --  28.9* 28.7*  PLT  --  160  --   --  165 168  APTT 84* 113*  --   --   --   --   HEPARINUNFRC  --  0.78*   < > 0.65 0.61 0.41  CREATININE  --  0.50  --   --  0.52 0.71   < > = values in this interval not displayed.     Estimated Creatinine Clearance: 69.5 mL/min (by C-G formula based on SCr of 0.71 mg/dL).   Medications:  - on Eliquis 5mg  bid PTA. Pt's daughter reported that her last dose taken PTA was on 06/25/21 at 0600  Assessment: Patient is a 81 y.o F with ovarian cancer currently undergoing chemotherapy treatment and a hx DVT/PE in 2020, presented to the ED on 06/25/21 with c/o abdominal pain and n/v.  Abdominal CT on 12/29 showed findings consistent with SBO. Patient's now NPO. Pharmacy has been consulted to transition patient to heparin drip.  Today, 06/30/2021 Heparin level therapeutic (0.41) on 1300 units/hr CBC: Hgb 9.8 - stable, plts WNL No bleeding or infusion issues documented Plan to transition to home regimen apixaban   Goal of Therapy:  Heparin level 0.3-0.7 Monitor platelets by anticoagulation protocol: Yes   Plan:  Stop heparin drip and resume apixaban 5 mg PO BID  Monitor CBC, Scr   Royetta Asal, PharmD, BCPS 06/30/2021 10:08 AM

## 2021-06-30 NOTE — Progress Notes (Signed)
Paula Peterson   DOB:June 01, 1941   PZ#:[id number]    ASSESSMENT & PLAN:  Recurrent ovarian cancer Her most recent CT imaging done 2 weeks ago show positive response to treatment Continue supportive care   Small bowel obstruction I have personally reviewed the imaging study Most likely this is adhesions related but malignancy causing bowel obstruction cannot be excluded although she has good response to treatment recently NG tube has been removed Her bowel obstruction is resolving  Continue to advance diet as tolerated  abdominal pain, resolved Due to small bowel obstruction   Mild pancytopenia Due to recent chemotherapy Observe   History of recurrent PE On anticoagulation therapy, transition to heparin  Will resume oral anticoagulation therapy Will order echocardiogram prior to discharge   Code Status DNR   Discharge planning She can be discharged home tomorrow if stable She has appointment to see me third week of January to resume treatment  All questions were answered. The patient knows to call the clinic with any problems, questions or concerns.   The total time spent in the appointment was 25 minutes encounter with patients including review of chart and various tests results, discussions about plan of care and coordination of care plan  [name], MD 06/30/2021 10:32 AM  Subjective:  She is feeling better NG tube has been removed and she was placed on liquid diet which she tolerated better She denies pain or nausea She had multiple bowel movement  Objective:  Vitals:   06/29/21 2109 06/30/21 0551  BP: (!) 131/53 119/61  Pulse: 75 68  Resp: 17 16  Temp: 98.7 F (37.1 C) 98.6 F (37 C)  SpO2: 95% 95%     Intake/Output Summary (Last 24 hours) at 06/30/2021 1032 Last data filed at 06/29/2021 2240 Gross per 24 hour  Intake 1554 ml  Output 1 ml  Net 1553 ml    GENERAL:alert, no distress and comfortable SKIN: skin color, texture, turgor are normal, no rashes or  significant lesions EYES: normal, Conjunctiva are pink and non-injected, sclera clear OROPHARYNX:no exudate, no erythema and lips, buccal mucosa, and tongue normal  NECK: supple, thyroid normal size, non-tender, without nodularity LYMPH:  no palpable lymphadenopathy in the cervical, axillary or inguinal LUNGS: clear to auscultation and percussion with normal breathing effort HEART: regular rate & rhythm and no murmurs and no lower extremity edema ABDOMEN:abdomen soft, non-tender and normal bowel sounds Musculoskeletal:no cyanosis of digits and no clubbing  NEURO: alert & oriented x 3 with fluent speech, no focal motor/sensory deficits   Labs:  Recent Labs    06/28/21 0415 06/29/21 0609 06/30/21 0649  NA 138 133* 132*  K 3.8 3.8 3.8  CL 96* 100 99  CO2 30 27 26   GLUCOSE 98 104* 123*  BUN 14 11 12   CREATININE 0.50 0.52 0.71  CALCIUM 9.4 9.3 9.3  GFRNONAA >60 >60 >60  PROT 6.3* 6.5 6.5  ALBUMIN 3.1* 3.0* 3.2*  AST 19 21 25   ALT 16 21 24   ALKPHOS 70 76 84  BILITOT 1.0 1.1 1.1    Studies:  DG Abd 1 View  Result Date: 06/28/2021 CLINICAL DATA:  Small-bowel obstruction.  Follow-up exam. EXAM: ABDOMEN - 1 VIEW COMPARISON:  06/27/2021 and older exams. FINDINGS: Residual contrast is noted a mostly decompressed colon. Several prominent small bowel loops are noted centrally similar to the prior study. Nasogastric tube is stable and well positioned. IMPRESSION: 1. Prominent central small bowel loops similar to the previous day's exam. Findings suggest a  mild residual low-grade small bowel obstruction. Electronically Signed   By: Lajean Manes M.D.   On: 06/28/2021 17:14   CT ABDOMEN PELVIS W CONTRAST  Result Date: 06/26/2021 CLINICAL DATA:  Abdominal pain. History of right ovarian cancer status post TAH/BSO and complete chemotherapy. EXAM: CT ABDOMEN AND PELVIS WITH CONTRAST TECHNIQUE: Multidetector CT imaging of the abdomen and pelvis was performed using the standard protocol following  bolus administration of intravenous contrast. CONTRAST:  35mL OMNIPAQUE IOHEXOL 350 MG/ML SOLN COMPARISON:  CT abdomen pelvis dated 06/13/2021. FINDINGS: Lower chest: Bibasilar linear atelectasis/scarring. The visualized lung bases are otherwise clear. Coronary vascular calcifications. No intra-abdominal free air.  Small free fluid within the pelvis. Hepatobiliary: No focal liver abnormality is seen. No gallstones, gallbladder wall thickening, or biliary dilatation. Pancreas: Unremarkable. No pancreatic ductal dilatation or surrounding inflammatory changes. Spleen: Normal in size without focal abnormality. Adrenals/Urinary Tract: The adrenal glands are unremarkable. There is mild bilateral renal parenchyma atrophy. There is no hydronephrosis on either side. There is symmetric enhancement and excretion of contrast by both kidneys. Small bilateral parapelvic cysts. The visualized ureters appear unremarkable. The urinary bladder is collapsed. Stomach/Bowel: Multiple mildly dilated and fecalized small bowel loops in the lower abdomen noted measuring up to 3 cm. There is thickened and inflamed appearance of distal small bowel within the pelvis. A transition is noted in the right lower quadrant (58/2 and 66/4). Findings most consistent with developing small-bowel obstruction with possible associated enteritis. There is abutment of loops of small bowel to the anterior peritoneal wall consistent with adhesions. There is a stone at the base of the appendix. No inflammatory changes. Vascular/Lymphatic: Moderate aortoiliac atherosclerotic disease. The IVC is unremarkable. No portal venous gas. There is no adenopathy. Reproductive: Hysterectomy. No adnexal masses. A pessary is noted in the pelvis. Other: Midline vertical anterior pelvic wall incisional scar. Musculoskeletal: Osteopenia with degenerative changes of the spine. No acute osseous pathology. IMPRESSION: 1. Findings most consistent with developing small-bowel  obstruction with transition in the right lower quadrant. Possible associated enteritis. 2. Aortic Atherosclerosis (ICD10-I70.0). Electronically Signed   By: Anner Crete M.D.   On: 06/26/2021 02:43   CT ABDOMEN PELVIS W CONTRAST  Result Date: 06/15/2021 CLINICAL DATA:  Right ovarian cancer, status post TAH/BSO, chemotherapy complete EXAM: CT ABDOMEN AND PELVIS WITH CONTRAST TECHNIQUE: Multidetector CT imaging of the abdomen and pelvis was performed using the standard protocol following bolus administration of intravenous contrast. CONTRAST:  1mL OMNIPAQUE IOHEXOL 350 MG/ML SOLN COMPARISON:  03/19/2021 FINDINGS: Lower chest: Lung bases are essentially clear. Hepatobiliary: Liver is within normal limits. Gallbladder is unremarkable. No intrahepatic or extrahepatic duct dilatation. Pancreas: Within normal limits. Spleen: Within normal limits. Adrenals/Urinary Tract: Adrenal glands are within normal limits. Kidneys are within normal limits.  No hydronephrosis. Bladder is within normal limits. Stomach/Bowel: Stomach is within normal limits. No evidence of bowel obstruction. Normal appendix (series 2/image 86). No colonic wall thickening or inflammatory changes. Vascular/Lymphatic: No evidence of abdominal aortic aneurysm. Atherosclerotic calcifications of the abdominal aorta and branch vessels. Prior portacaval node has essentially resolved. Additional 9 mm short axis retrocaval node is grossly unchanged (series 2/image 25). 16 mm short axis right inguinal node (series 2/image 74), previously 20 mm. Additional 11 mm short axis left inguinal node, previously 16 mm (series 2/image 72). Reproductive: Status post hysterectomy.  Vaginal pessary. Bilateral ovaries are within normal limits. Other: No abdominopelvic ascites. Scattered peritoneal nodularity, improved. Dominant 1.2 x 4.6 cm implant beneath the right mid abdominal wall (series 2/image  49), previously 1.9 x 6.0 cm. Additional 1.4 x 2.2 cm implant in the  left lower pelvis adjacent to the rectosigmoid colon (series 2/image 68), previously 1.8 x 2.5 cm. Musculoskeletal: Degenerative changes of the visualized thoracolumbar spine. IMPRESSION: Improving abdominopelvic lymphadenopathy, including a dominant 16 mm short axis right inguinal node, as above. Improving peritoneal carcinomatosis, including a dominant 1.2 x 4.6 cm implant beneath the right mid abdominal wall. Electronically Signed   By: Julian Hy M.D.   On: 06/15/2021 14:56   DG Abd Portable 1V-Small Bowel Obstruction Protocol-24 hr delay  Result Date: 06/27/2021 CLINICAL DATA:  Twenty-four hour delay images for small bowel obstruction EXAM: PORTABLE ABDOMEN - 1 VIEW COMPARISON:  06/26/2021 FINDINGS: Contrast material is demonstrated in the right colon suggesting no evidence of complete obstruction. Gas-filled mildly dilated mid abdominal small bowel loops are identified. Enteric tube tip in the left upper quadrant consistent with location in the upper stomach. IMPRESSION: Contrast material demonstrated in the right colon suggesting no evidence of complete obstruction. Electronically Signed   By: Lucienne Capers M.D.   On: 06/27/2021 15:22   DG Abd Portable 1V-Small Bowel Obstruction Protocol-initial, 8 hr delay  Result Date: 06/26/2021 CLINICAL DATA:  Small bowel protocol.  8 hours post-contrast film. EXAM: PORTABLE ABDOMEN - 1 VIEW COMPARISON:  June 26, 2021 (9:03 a.m.) FINDINGS: A nasogastric tube is seen with its distal tip noted within the body of the stomach. The distal side hole sits approximately 4.6 cm distal to the gastroesophageal junction. A paucity of bowel gas is noted throughout the abdomen. Radiopaque contrast is not clearly identified within the large or small bowel and may be markedly diluted. No radio-opaque calculi or other significant radiographic abnormality are seen. IMPRESSION: 1. Nasogastric tube positioning, as described above. 2. Radiopaque contrast is not clearly  identified within the large or small bowel and may be markedly diluted. Electronically Signed   By: Virgina Norfolk M.D.   On: 06/26/2021 22:12   DG Abd Portable 1V-Small Bowel Protocol-Position Verification  Result Date: 06/26/2021 CLINICAL DATA:  Nasogastric tube placement. EXAM: PORTABLE ABDOMEN - 1 VIEW COMPARISON:  Abdominal CT 06/26/2021. FINDINGS: 0903 hours. Enteric tube projects below the diaphragm, tip at the level of the mid stomach. The tube is mildly kinked at its side hole. A right-sided Port-A-Cath tip projects to the level of the superior cavoatrial junction. Mild atelectasis is present at both lung bases. The visualized bowel gas pattern is normal. IMPRESSION: Enteric tube projects over the mid stomach, slightly kinked at its side hole. Electronically Signed   By: Richardean Sale M.D.   On: 06/26/2021 10:54

## 2021-07-01 DIAGNOSIS — K56609 Unspecified intestinal obstruction, unspecified as to partial versus complete obstruction: Secondary | ICD-10-CM | POA: Diagnosis not present

## 2021-07-01 LAB — MAGNESIUM: Magnesium: 1.5 mg/dL — ABNORMAL LOW (ref 1.7–2.4)

## 2021-07-01 LAB — CBC
HCT: 27 % — ABNORMAL LOW (ref 36.0–46.0)
Hemoglobin: 9 g/dL — ABNORMAL LOW (ref 12.0–15.0)
MCH: 36.6 pg — ABNORMAL HIGH (ref 26.0–34.0)
MCHC: 33.3 g/dL (ref 30.0–36.0)
MCV: 109.8 fL — ABNORMAL HIGH (ref 80.0–100.0)
Platelets: 153 10*3/uL (ref 150–400)
RBC: 2.46 MIL/uL — ABNORMAL LOW (ref 3.87–5.11)
RDW: 16.9 % — ABNORMAL HIGH (ref 11.5–15.5)
WBC: 2.9 10*3/uL — ABNORMAL LOW (ref 4.0–10.5)
nRBC: 0 % (ref 0.0–0.2)

## 2021-07-01 LAB — COMPREHENSIVE METABOLIC PANEL
ALT: 39 U/L (ref 0–44)
AST: 38 U/L (ref 15–41)
Albumin: 3.1 g/dL — ABNORMAL LOW (ref 3.5–5.0)
Alkaline Phosphatase: 93 U/L (ref 38–126)
Anion gap: 4 — ABNORMAL LOW (ref 5–15)
BUN: 11 mg/dL (ref 8–23)
CO2: 28 mmol/L (ref 22–32)
Calcium: 9.5 mg/dL (ref 8.9–10.3)
Chloride: 103 mmol/L (ref 98–111)
Creatinine, Ser: 0.8 mg/dL (ref 0.44–1.00)
GFR, Estimated: >60 mL/min (ref >60)
Glucose, Bld: 104 mg/dL — ABNORMAL HIGH (ref 70–99)
Potassium: 4.1 mmol/L (ref 3.5–5.1)
Sodium: 135 mmol/L (ref 135–145)
Total Bilirubin: 0.9 mg/dL (ref 0.3–1.2)
Total Protein: 6.3 g/dL — ABNORMAL LOW (ref 6.5–8.1)

## 2021-07-01 MED ORDER — HEPARIN SOD (PORK) LOCK FLUSH 100 UNIT/ML IV SOLN
500.0000 [IU] | INTRAVENOUS | Status: AC | PRN
  Administered 2021-07-01: 500 [IU]
  Filled 2021-07-01: qty 5

## 2021-07-01 MED ORDER — POLYETHYLENE GLYCOL 3350 17 G PO PACK: 17.0000 g | PACK | Freq: Every day | ORAL | 0 refills | Status: DC

## 2021-07-01 NOTE — Progress Notes (Signed)
Discharge instructions given to patient and patient's daughter.  Education emphasized on GI soft diet.  Encourage to call the doctor for questions.  Both verbalized understanding.  No complaints, waiting for IV team for port de-access.  Needs addressed.

## 2021-07-01 NOTE — Discharge Summary (Signed)
Physician Discharge Summary  Paula Peterson SHF:[id number] DOB: 07/25/1940 DOA: 06/25/2021  PCP: Caryl Bis, MD  Admit date: 06/25/2021 Discharge date: 07/01/2021  Admitted From: home Disposition:  home  Recommendations for Outpatient Follow-up:  Follow up with PCP in 1-2 weeks Please obtain BMP/CBC in one week Please follow up on the following pending results:  Home Health:yes Equipment/Devices:none  Discharge Condition:stable CODE STATUS:DNR Diet recommendation: cardiac  Brief/Interim Summary:81 year old female with ovarian cancer, DVT on Eliquis, hypothyroidism and hypertension status post recent chemo session 3 days prior to admission to hospital admitted with nausea vomiting and abdominal pain found to have small bowel obstruction likely from adhesions.  Patient seen by general surgery and oncology.  Discharge Diagnoses:  Principal Problem:   SBO (small bowel obstruction) ([place])    #1 small bowel obstruction-likely secondary to adhesions. patient was treated conservatively with NG tube, IV fluids, pain control. She was encouraged to ambulate. On the day of discharge she was able to tolerate a regular diet and she was almost back to her baseline.    #2 recurrent ovarian cancer currently on chemotherapy received chemo  few days prior to admission to hospital. She has appointment follow-up with Dr. Alvy Bimler on January 24. Echocardiogram-normal ejection fraction normal left follicular functional regional wall motion abnormalities. She has played one diastolic dysfunction.   #3 history of DVT on Eliquis    #4 history of essential hypertension blood pressure stable 137/65  she is not on any antihypertensives.     #5 GERD continue symptomatic treatment   #6 hypomagnesemia resolved    #7 neutropenia due to recent chemotherapy    Estimated body mass index is 34.36 kg/m as calculated from the following:   Height as of this encounter: 5\' 7"  (1.702 m).   Weight as of this  encounter: 99.5 kg.  Discharge Instructions  Discharge Instructions     Diet - low sodium heart healthy   Complete by: As directed    Increase activity slowly   Complete by: As directed       Allergies as of 07/01/2021   No Known Allergies      Medication List     STOP taking these medications    atenolol 25 MG tablet Commonly known as: TENORMIN   cephALEXin 500 MG capsule Commonly known as: KEFLEX   pantoprazole 40 MG tablet Commonly known as: PROTONIX   potassium chloride SA 20 MEQ tablet Commonly known as: KLOR-CON M       TAKE these medications    acetaminophen 325 MG tablet Commonly known as: TYLENOL Take 650 mg by mouth every 6 (six) hours as needed for moderate pain or headache.   Docusate Sodium 100 MG capsule Take 100 mg by mouth 2 (two) times daily as needed for constipation.   Eliquis 5 MG Tabs tablet Generic drug: apixaban TAKE ONE TABLET BY MOUTH TWICE DAILY What changed: how much to take   fexofenadine 180 MG tablet Commonly known as: ALLEGRA Take 180 mg by mouth daily as needed for allergies or rhinitis.   levothyroxine 75 MCG tablet Commonly known as: SYNTHROID Take 1 tablet (75 mcg total) by mouth daily before breakfast.   lidocaine-prilocaine cream Commonly known as: EMLA Apply to affected area once What changed:  how much to take how to take this when to take this reasons to take this additional instructions   melatonin 3 MG Tabs tablet Take 3 mg by mouth at bedtime as needed (sleep).   ondansetron 8 MG tablet  Commonly known as: Zofran Take 1 tablet (8 mg total) by mouth 2 (two) times daily as needed (Nausea or vomiting).   polyethylene glycol 17 g packet Commonly known as: MIRALAX / GLYCOLAX Take 17 g by mouth daily. Start taking on: July 02, 2021   prochlorperazine 10 MG tablet Commonly known as: COMPAZINE Take 1 tablet (10 mg total) by mouth every 6 (six) hours as needed (Nausea or vomiting).   senna-docusate  8.6-50 MG tablet Commonly known as: Senokot-S Take 2 tablets by mouth at bedtime. What changed:  when to take this reasons to take this        Follow-up Information     Caryl Bis, MD Follow up.   Specialty: Family Medicine Contact information: [place] [address] [phone]         [name], MD Follow up.   Specialty: Hematology and Oncology Contact information: [place] [address] [phone]                No Known Allergies  Consultations: Oncology General surgery   Procedures/Studies: DG Abd 1 View  Result Date: 06/28/2021 CLINICAL DATA:  Small-bowel obstruction.  Follow-up exam. EXAM: ABDOMEN - 1 VIEW COMPARISON:  06/27/2021 and older exams. FINDINGS: Residual contrast is noted a mostly decompressed colon. Several prominent small bowel loops are noted centrally similar to the prior study. Nasogastric tube is stable and well positioned. IMPRESSION: 1. Prominent central small bowel loops similar to the previous day's exam. Findings suggest a mild residual low-grade small bowel obstruction. Electronically Signed   By: Lajean Manes M.D.   On: 06/28/2021 17:14   CT ABDOMEN PELVIS W CONTRAST  Result Date: 06/26/2021 CLINICAL DATA:  Abdominal pain. History of right ovarian cancer status post TAH/BSO and complete chemotherapy. EXAM: CT ABDOMEN AND PELVIS WITH CONTRAST TECHNIQUE: Multidetector CT imaging of the abdomen and pelvis was performed using the standard protocol following bolus administration of intravenous contrast. CONTRAST:  90mL OMNIPAQUE IOHEXOL 350 MG/ML SOLN COMPARISON:  CT abdomen pelvis dated 06/13/2021. FINDINGS: Lower chest: Bibasilar linear atelectasis/scarring. The visualized lung bases are otherwise clear. Coronary vascular calcifications. No intra-abdominal free air.  Small free fluid within the pelvis. Hepatobiliary: No focal liver abnormality is seen. No gallstones, gallbladder wall  thickening, or biliary dilatation. Pancreas: Unremarkable. No pancreatic ductal dilatation or surrounding inflammatory changes. Spleen: Normal in size without focal abnormality. Adrenals/Urinary Tract: The adrenal glands are unremarkable. There is mild bilateral renal parenchyma atrophy. There is no hydronephrosis on either side. There is symmetric enhancement and excretion of contrast by both kidneys. Small bilateral parapelvic cysts. The visualized ureters appear unremarkable. The urinary bladder is collapsed. Stomach/Bowel: Multiple mildly dilated and fecalized small bowel loops in the lower abdomen noted measuring up to 3 cm. There is thickened and inflamed appearance of distal small bowel within the pelvis. A transition is noted in the right lower quadrant (58/2 and 66/4). Findings most consistent with developing small-bowel obstruction with possible associated enteritis. There is abutment of loops of small bowel to the anterior peritoneal wall consistent with adhesions. There is a stone at the base of the appendix. No inflammatory changes. Vascular/Lymphatic: Moderate aortoiliac atherosclerotic disease. The IVC is unremarkable. No portal venous gas. There is no adenopathy. Reproductive: Hysterectomy. No adnexal masses. A pessary is noted in the pelvis. Other: Midline vertical anterior pelvic wall incisional scar. Musculoskeletal: Osteopenia with degenerative changes of the spine. No acute osseous pathology. IMPRESSION: 1. Findings most consistent with developing small-bowel  obstruction with transition in the right lower quadrant. Possible associated enteritis. 2. Aortic Atherosclerosis (ICD10-I70.0). Electronically Signed   By: Anner Crete M.D.   On: 06/26/2021 02:43   CT ABDOMEN PELVIS W CONTRAST  Result Date: 06/15/2021 CLINICAL DATA:  Right ovarian cancer, status post TAH/BSO, chemotherapy complete EXAM: CT ABDOMEN AND PELVIS WITH CONTRAST TECHNIQUE: Multidetector CT imaging of the abdomen and  pelvis was performed using the standard protocol following bolus administration of intravenous contrast. CONTRAST:  91mL OMNIPAQUE IOHEXOL 350 MG/ML SOLN COMPARISON:  03/19/2021 FINDINGS: Lower chest: Lung bases are essentially clear. Hepatobiliary: Liver is within normal limits. Gallbladder is unremarkable. No intrahepatic or extrahepatic duct dilatation. Pancreas: Within normal limits. Spleen: Within normal limits. Adrenals/Urinary Tract: Adrenal glands are within normal limits. Kidneys are within normal limits.  No hydronephrosis. Bladder is within normal limits. Stomach/Bowel: Stomach is within normal limits. No evidence of bowel obstruction. Normal appendix (series 2/image 86). No colonic wall thickening or inflammatory changes. Vascular/Lymphatic: No evidence of abdominal aortic aneurysm. Atherosclerotic calcifications of the abdominal aorta and branch vessels. Prior portacaval node has essentially resolved. Additional 9 mm short axis retrocaval node is grossly unchanged (series 2/image 25). 16 mm short axis right inguinal node (series 2/image 74), previously 20 mm. Additional 11 mm short axis left inguinal node, previously 16 mm (series 2/image 72). Reproductive: Status post hysterectomy.  Vaginal pessary. Bilateral ovaries are within normal limits. Other: No abdominopelvic ascites. Scattered peritoneal nodularity, improved. Dominant 1.2 x 4.6 cm implant beneath the right mid abdominal wall (series 2/image 49), previously 1.9 x 6.0 cm. Additional 1.4 x 2.2 cm implant in the left lower pelvis adjacent to the rectosigmoid colon (series 2/image 68), previously 1.8 x 2.5 cm. Musculoskeletal: Degenerative changes of the visualized thoracolumbar spine. IMPRESSION: Improving abdominopelvic lymphadenopathy, including a dominant 16 mm short axis right inguinal node, as above. Improving peritoneal carcinomatosis, including a dominant 1.2 x 4.6 cm implant beneath the right mid abdominal wall. Electronically Signed   By:  Julian Hy M.D.   On: 06/15/2021 14:56   DG Abd Portable 1V-Small Bowel Obstruction Protocol-24 hr delay  Result Date: 06/27/2021 CLINICAL DATA:  Twenty-four hour delay images for small bowel obstruction EXAM: PORTABLE ABDOMEN - 1 VIEW COMPARISON:  06/26/2021 FINDINGS: Contrast material is demonstrated in the right colon suggesting no evidence of complete obstruction. Gas-filled mildly dilated mid abdominal small bowel loops are identified. Enteric tube tip in the left upper quadrant consistent with location in the upper stomach. IMPRESSION: Contrast material demonstrated in the right colon suggesting no evidence of complete obstruction. Electronically Signed   By: Lucienne Capers M.D.   On: 06/27/2021 15:22   DG Abd Portable 1V-Small Bowel Obstruction Protocol-initial, 8 hr delay  Result Date: 06/26/2021 CLINICAL DATA:  Small bowel protocol.  8 hours post-contrast film. EXAM: PORTABLE ABDOMEN - 1 VIEW COMPARISON:  June 26, 2021 (9:03 a.m.) FINDINGS: A nasogastric tube is seen with its distal tip noted within the body of the stomach. The distal side hole sits approximately 4.6 cm distal to the gastroesophageal junction. A paucity of bowel gas is noted throughout the abdomen. Radiopaque contrast is not clearly identified within the large or small bowel and may be markedly diluted. No radio-opaque calculi or other significant radiographic abnormality are seen. IMPRESSION: 1. Nasogastric tube positioning, as described above. 2. Radiopaque contrast is not clearly identified within the large or small bowel and may be markedly diluted. Electronically Signed   By: Virgina Norfolk M.D.   On: 06/26/2021 22:12  DG Abd Portable 1V-Small Bowel Protocol-Position Verification  Result Date: 06/26/2021 CLINICAL DATA:  Nasogastric tube placement. EXAM: PORTABLE ABDOMEN - 1 VIEW COMPARISON:  Abdominal CT 06/26/2021. FINDINGS: 0903 hours. Enteric tube projects below the diaphragm, tip at the level of the  mid stomach. The tube is mildly kinked at its side hole. A right-sided Port-A-Cath tip projects to the level of the superior cavoatrial junction. Mild atelectasis is present at both lung bases. The visualized bowel gas pattern is normal. IMPRESSION: Enteric tube projects over the mid stomach, slightly kinked at its side hole. Electronically Signed   By: Richardean Sale M.D.   On: 06/26/2021 10:54   ECHOCARDIOGRAM COMPLETE  Result Date: 06/30/2021    ECHOCARDIOGRAM LIMITED REPORT   Patient Name:   Paula Peterson Discher Date of Exam: 06/30/2021 Medical Rec #:  [id number]    Height:       67.0 in Accession #:    [id number]   Weight:       215.0 lb Date of Birth:  Mar 16, 1941   BSA:          2.085 m Patient Age:    81 years     BP:           119/61 mmHg Patient Gender: F            HR:           78 bpm. Exam Location:  Inpatient Procedure: 2D Echo, Cardiac Doppler, Color Doppler and Strain Analysis Indications:    Chemo  History:        Patient has prior history of Echocardiogram examinations. Risk                 Factors:Hypertension.  Sonographer:    Jyl Heinz Referring Phys: [id number] NI Sumpter  1. Left ventricular ejection fraction, by estimation, is 60 to 65%. The left ventricle has normal function. The left ventricle has no regional wall motion abnormalities. There is mild left ventricular hypertrophy. Left ventricular diastolic parameters are consistent with Grade I diastolic dysfunction (impaired relaxation). The average left ventricular global longitudinal strain is -18.2 %. The global longitudinal strain is normal.  2. Right ventricular systolic function is normal. The right ventricular size is normal. Tricuspid regurgitation signal is inadequate for assessing PA pressure.  3. The mitral valve is normal in structure. Trivial mitral valve regurgitation. No evidence of mitral stenosis.  4. The aortic valve is tricuspid. Aortic valve regurgitation is mild to moderate. No aortic stenosis is present. FINDINGS   Left Ventricle: Left ventricular ejection fraction, by estimation, is 60 to 65%. The left ventricle has normal function. The left ventricle has no regional wall motion abnormalities. The average left ventricular global longitudinal strain is -18.2 %. The global longitudinal strain is normal. The left ventricular internal cavity size was normal in size. There is mild left ventricular hypertrophy. Left ventricular diastolic parameters are consistent with Grade I diastolic dysfunction (impaired relaxation). Normal left ventricular filling pressure. Right Ventricle: The right ventricular size is normal. No increase in right ventricular wall thickness. Right ventricular systolic function is normal. Tricuspid regurgitation signal is inadequate for assessing PA pressure. Left Atrium: Left atrial size was normal in size. Right Atrium: Right atrial size was normal in size. Pericardium: There is no evidence of pericardial effusion. Mitral Valve: The mitral valve is normal in structure. Trivial mitral valve regurgitation. No evidence of mitral valve stenosis. Tricuspid Valve: The tricuspid valve is normal in structure. Tricuspid valve regurgitation is not demonstrated.  No evidence of tricuspid stenosis. Aortic Valve: The aortic valve is tricuspid. Aortic valve regurgitation is mild to moderate. Aortic regurgitation PHT measures 620 msec. No aortic stenosis is present. Aortic valve mean gradient measures 4.9 mmHg. Aortic valve peak gradient measures 8.2 mmHg. Aortic valve area, by VTI measures 2.25 cm. Pulmonic Valve: The pulmonic valve was not well visualized. Pulmonic valve regurgitation is not visualized. No evidence of pulmonic stenosis. Aorta: The aortic root is normal in size and structure. Venous: The inferior vena cava was not well visualized. IAS/Shunts: No atrial level shunt detected by color flow Doppler. LEFT VENTRICLE PLAX 2D LVIDd:         4.20 cm      Diastology LVIDs:         2.60 cm      LV e' medial:    5.87  cm/s LV PW:         1.10 cm      LV E/e' medial:  9.7 LV IVS:        1.20 cm      LV e' lateral:   9.14 cm/s LVOT diam:     2.00 cm      LV E/e' lateral: 6.2 LV SV:         66 LV SV Index:   32           2D Longitudinal Strain LVOT Area:     3.14 cm     2D Strain GLS Avg:     -18.2 %  LV Volumes (MOD) LV vol d, MOD A2C: 85.0 ml  3D Volume EF: LV vol d, MOD A4C: 106.0 ml 3D EF:        59 % LV vol s, MOD A2C: 36.0 ml  LV EDV:       130 ml LV vol s, MOD A4C: 45.2 ml  LV ESV:       53 ml LV SV MOD A2C:     49.0 ml  LV SV:        77 ml LV SV MOD A4C:     106.0 ml LV SV MOD BP:      56.9 ml RIGHT VENTRICLE             IVC RV Basal diam:  3.00 cm     IVC diam: 1.10 cm RV Mid diam:    2.80 cm RV S prime:     11.00 cm/s TAPSE (M-mode): 2.1 cm LEFT ATRIUM             Index        RIGHT ATRIUM           Index LA diam:        3.00 cm 1.44 cm/m   RA Area:     12.00 cm LA Vol (A2C):   37.5 ml 17.98 ml/m  RA Volume:   25.00 ml  11.99 ml/m LA Vol (A4C):   37.0 ml 17.74 ml/m LA Biplane Vol: 40.6 ml 19.47 ml/m  AORTIC VALVE AV Area (Vmax):    2.30 cm AV Area (Vmean):   2.32 cm AV Area (VTI):     2.25 cm AV Vmax:           143.38 cm/s AV Vmean:          102.712 cm/s AV VTI:            0.293 m AV Peak Grad:      8.2 mmHg AV Mean Grad:  4.9 mmHg LVOT Vmax:         105.00 cm/s LVOT Vmean:        75.800 cm/s LVOT VTI:          0.210 m LVOT/AV VTI ratio: 0.72 AI PHT:            620 msec  AORTA Ao Root diam: 2.90 cm Ao Asc diam:  2.80 cm MITRAL VALVE MV Area (PHT): 4.21 cm    SHUNTS MV Decel Time: 180 msec    Systemic VTI:  0.21 m MV E velocity: 56.90 cm/s  Systemic Diam: 2.00 cm MV A velocity: 67.30 cm/s MV E/A ratio:  0.85 Carlyle Dolly MD Electronically signed by Carlyle Dolly MD Signature Date/Time: 06/30/2021/12:53:01 PM    Final    (Echo, Carotid, EGD, Colonoscopy, ERCP)    Subjective:  Patient sitting in by the side of the bed daughter is also in the room. She feels better very anxious to go home no new  complaints Discharge Exam: Vitals:   06/30/21 2134 07/01/21 0640  BP: 134/65 140/63  Pulse: 76 67  Resp: 18 18  Temp: 98.7 F (37.1 C) 98.1 F (36.7 C)  SpO2: 97% 96%   Vitals:   06/30/21 1330 06/30/21 2134 07/01/21 0500 07/01/21 0640  BP:  134/65  140/63  Pulse:  76  67  Resp:  18  18  Temp: 98.6 F (37 C) 98.7 F (37.1 C)  98.1 F (36.7 C)  TempSrc: Oral Oral  Oral  SpO2:  97%  96%  Weight:   99.5 kg   Height:        General: Pt is alert, awake, not in acute distress Cardiovascular: RRR, S1/S2 +, no rubs, no gallops Respiratory: CTA bilaterally, no wheezing, no rhonchi Abdominal: Soft, NT, ND, bowel sounds + Extremities: no edema, no cyanosis    The results of significant diagnostics from this hospitalization (including imaging, microbiology, ancillary and laboratory) are listed below for reference.     Microbiology: Recent Results (from the past 240 hour(s))  Resp Panel by RT-PCR (Flu A&B, Covid) Nasopharyngeal Swab     Status: None   Collection Time: 06/26/21  2:51 AM   Specimen: Nasopharyngeal Swab; Nasopharyngeal(NP) swabs in vial transport medium  Result Value Ref Range Status   SARS Coronavirus 2 by RT PCR NEGATIVE NEGATIVE Final    Comment: (NOTE) SARS-CoV-2 target nucleic acids are NOT DETECTED.  The SARS-CoV-2 RNA is generally detectable in upper respiratory specimens during the acute phase of infection. The lowest concentration of SARS-CoV-2 viral copies this assay can detect is 138 copies/mL. A negative result does not preclude SARS-Cov-2 infection and should not be used as the sole basis for treatment or other patient management decisions. A negative result may occur with  improper specimen collection/handling, submission of specimen other than nasopharyngeal swab, presence of viral mutation(s) within the areas targeted by this assay, and inadequate number of viral copies(<138 copies/mL). A negative result must be combined with clinical  observations, patient history, and epidemiological information. The expected result is Negative.  Fact Sheet for Patients:  EntrepreneurPulse.com.au  Fact Sheet for Healthcare Providers:  IncredibleEmployment.be  This test is no t yet approved or cleared by the [place] FDA and  has been authorized for detection and/or diagnosis of SARS-CoV-2 by FDA under an Emergency Use Authorization (EUA). This EUA will remain  in effect (meaning this test can be used) for the duration of the COVID-19 declaration under Section 564(b)(1) of the Act, 21  U.S.C.section 360bbb-3(b)(1), unless the authorization is terminated  or revoked sooner.       Influenza A by PCR NEGATIVE NEGATIVE Final   Influenza B by PCR NEGATIVE NEGATIVE Final    Comment: (NOTE) The Xpert Xpress SARS-CoV-2/FLU/RSV plus assay is intended as an aid in the diagnosis of influenza from Nasopharyngeal swab specimens and should not be used as a sole basis for treatment. Nasal washings and aspirates are unacceptable for Xpert Xpress SARS-CoV-2/FLU/RSV testing.  Fact Sheet for Patients: EntrepreneurPulse.com.au  Fact Sheet for Healthcare Providers: IncredibleEmployment.be  This test is not yet approved or cleared by the [place] FDA and has been authorized for detection and/or diagnosis of SARS-CoV-2 by FDA under an Emergency Use Authorization (EUA). This EUA will remain in effect (meaning this test can be used) for the duration of the COVID-19 declaration under Section 564(b)(1) of the Act, 21 U.S.C. section 360bbb-3(b)(1), unless the authorization is terminated or revoked.  Performed at [facility], [place] [address] [place], [place] [address]      Labs: BNP (last 3 results) No results for input(s): BNP in the last 8760 hours. Basic Metabolic Panel: Recent Labs  Lab 06/27/21 0501 06/28/21 0415 06/28/21 1223  06/29/21 0609 06/30/21 0649 06/30/21 1450 07/01/21 0324  NA 135 138  --  133* 132* 133* 135  K 4.1 3.8  --  3.8 3.8 3.7 4.1  CL 97* 96*  --  100 99 104 103  CO2 28 30  --  27 26 26 28   GLUCOSE 100* 98  --  104* 123* 144* 104*  BUN 20 14  --  11 12 13 11   CREATININE 0.61 0.50  --  0.52 0.71 0.72 0.80  CALCIUM 9.4 9.4  --  9.3 9.3 8.6* 9.5  MG 1.5*  --  1.7 1.4* 1.7  --  1.5*   Liver Function Tests: Recent Labs  Lab 06/27/21 0501 06/28/21 0415 06/29/21 0609 06/30/21 0649 07/01/21 0324  AST 19 19 21 25  38  ALT 18 16 21 24  39  ALKPHOS 71 70 76 84 93  BILITOT 0.8 1.0 1.1 1.1 0.9  PROT 6.4* 6.3* 6.5 6.5 6.3*  ALBUMIN 3.2* 3.1* 3.0* 3.2* 3.1*   Recent Labs  Lab 06/25/21 2334  LIPASE 21   No results for input(s): AMMONIA in the last 168 hours. CBC: Recent Labs  Lab 06/25/21 2334 06/26/21 0700 06/27/21 0501 06/28/21 0415 06/29/21 0609 06/30/21 0649 07/01/21 0324  WBC 7.6   < > 5.2 3.8* 3.9* 3.1* 2.9*  NEUTROABS 5.3  --   --   --   --   --   --   HGB 10.1*   < > 9.9* 9.8* 9.7* 9.8* 9.0*  HCT 30.0*   < > 28.8* 29.6* 28.9* 28.7* 27.0*  MCV 109.9*   < > 108.3* 108.8* 109.5* 109.1* 109.8*  PLT 159   < > 164 160 165 168 153   < > = values in this interval not displayed.   Cardiac Enzymes: No results for input(s): CKTOTAL, CKMB, CKMBINDEX, TROPONINI in the last 168 hours. BNP: Invalid input(s): POCBNP CBG: No results for input(s): GLUCAP in the last 168 hours. D-Dimer No results for input(s): DDIMER in the last 72 hours. Hgb A1c No results for input(s): HGBA1C in the last 72 hours. Lipid Profile No results for input(s): CHOL, HDL, LDLCALC, TRIG, CHOLHDL, LDLDIRECT in the last 72 hours. Thyroid function studies No results for input(s): TSH, T4TOTAL, T3FREE, THYROIDAB in the last 72  hours.  Invalid input(s): FREET3 Anemia work up No results for input(s): VITAMINB12, FOLATE, FERRITIN, TIBC, IRON, RETICCTPCT in the last 72 hours. Urinalysis    Component Value  Date/Time   COLORURINE YELLOW 06/25/2021 2334   APPEARANCEUR HAZY (A) 06/25/2021 2334   LABSPEC 1.020 06/25/2021 2334   PHURINE 6.0 06/25/2021 2334   GLUCOSEU NEGATIVE 06/25/2021 2334   HGBUR NEGATIVE 06/25/2021 2334   BILIRUBINUR NEGATIVE 06/25/2021 2334   KETONESUR NEGATIVE 06/25/2021 2334   PROTEINUR NEGATIVE 06/25/2021 2334   UROBILINOGEN 0.2 04/08/2013 2040   NITRITE NEGATIVE 06/25/2021 2334   LEUKOCYTESUR LARGE (A) 06/25/2021 2334   Sepsis Labs Invalid input(s): PROCALCITONIN,  WBC,  LACTICIDVEN Microbiology Recent Results (from the past 240 hour(s))  Resp Panel by RT-PCR (Flu A&B, Covid) Nasopharyngeal Swab     Status: None   Collection Time: 06/26/21  2:51 AM   Specimen: Nasopharyngeal Swab; Nasopharyngeal(NP) swabs in vial transport medium  Result Value Ref Range Status   SARS Coronavirus 2 by RT PCR NEGATIVE NEGATIVE Final    Comment: (NOTE) SARS-CoV-2 target nucleic acids are NOT DETECTED.  The SARS-CoV-2 RNA is generally detectable in upper respiratory specimens during the acute phase of infection. The lowest concentration of SARS-CoV-2 viral copies this assay can detect is 138 copies/mL. A negative result does not preclude SARS-Cov-2 infection and should not be used as the sole basis for treatment or other patient management decisions. A negative result may occur with  improper specimen collection/handling, submission of specimen other than nasopharyngeal swab, presence of viral mutation(s) within the areas targeted by this assay, and inadequate number of viral copies(<138 copies/mL). A negative result must be combined with clinical observations, patient history, and epidemiological information. The expected result is Negative.  Fact Sheet for Patients:  EntrepreneurPulse.com.au  Fact Sheet for Healthcare Providers:  IncredibleEmployment.be  This test is no t yet approved or cleared by the [place] FDA and  has been  authorized for detection and/or diagnosis of SARS-CoV-2 by FDA under an Emergency Use Authorization (EUA). This EUA will remain  in effect (meaning this test can be used) for the duration of the COVID-19 declaration under Section 564(b)(1) of the Act, 21 U.S.C.section 360bbb-3(b)(1), unless the authorization is terminated  or revoked sooner.       Influenza A by PCR NEGATIVE NEGATIVE Final   Influenza B by PCR NEGATIVE NEGATIVE Final    Comment: (NOTE) The Xpert Xpress SARS-CoV-2/FLU/RSV plus assay is intended as an aid in the diagnosis of influenza from Nasopharyngeal swab specimens and should not be used as a sole basis for treatment. Nasal washings and aspirates are unacceptable for Xpert Xpress SARS-CoV-2/FLU/RSV testing.  Fact Sheet for Patients: EntrepreneurPulse.com.au  Fact Sheet for Healthcare Providers: IncredibleEmployment.be  This test is not yet approved or cleared by the [place] FDA and has been authorized for detection and/or diagnosis of SARS-CoV-2 by FDA under an Emergency Use Authorization (EUA). This EUA will remain in effect (meaning this test can be used) for the duration of the COVID-19 declaration under Section 564(b)(1) of the Act, 21 U.S.C. section 360bbb-3(b)(1), unless the authorization is terminated or revoked.  Performed at [facility], [place] [address] [place], [place] [address]      Time coordinating discharge: 39 minutes  SIGNED:   Georgette Shell, MD  [facility] 07/01/2021, 4:55 PM

## 2021-07-01 NOTE — Progress Notes (Signed)
Subjective/Chief Complaint: Tolerating soft diet, no nausea or vomiting. Having bowel movements. Overall feels well this morning.   Objective: Vital signs in last 24 hours: Temp:  [98.1 F (36.7 C)-100.8 F (38.2 C)] 98.1 F (36.7 C) (01/03 0640) Pulse Rate:  [67-78] 67 (01/03 0640) Resp:  [16-18] 18 (01/03 0640) BP: (126-140)/(59-65) 140/63 (01/03 0640) SpO2:  [96 %-98 %] 96 % (01/03 0640) Weight:  [99.5 kg] 99.5 kg (01/03 0500) Last BM Date: 06/30/21  Intake/Output from previous day: 01/02 0701 - 01/03 0700 In: 840 [P.O.:840] Out: -  Intake/Output this shift: Total I/O In: 10 [I.V.:10] Out: -   General: resting comfortably, NAD Neuro: alert and oriented, no focal deficits Resp: normal work of breathing Abdomen: soft, nondistended, nontender to palpation. Extremities: warm and well-perfused   Lab Results:  Recent Labs    06/30/21 0649 07/01/21 0324  WBC 3.1* 2.9*  HGB 9.8* 9.0*  HCT 28.7* 27.0*  PLT 168 153   BMET Recent Labs    06/30/21 1450 07/01/21 0324  NA 133* 135  K 3.7 4.1  CL 104 103  CO2 26 28  GLUCOSE 144* 104*  BUN 13 11  CREATININE 0.72 0.80  CALCIUM 8.6* 9.5   PT/INR No results for input(s): LABPROT, INR in the last 72 hours. ABG No results for input(s): PHART, HCO3 in the last 72 hours.  Invalid input(s): PCO2, PO2  Studies/Results: ECHOCARDIOGRAM COMPLETE  Result Date: 06/30/2021    ECHOCARDIOGRAM LIMITED REPORT   Patient Name:   Paula Peterson Date of Exam: 06/30/2021 Medical Rec #:  [id number]    Height:       67.0 in Accession #:    [id number]   Weight:       215.0 lb Date of Birth:  June 09, 1941   BSA:          2.085 m Patient Age:    81 years     BP:           119/61 mmHg Patient Gender: F            HR:           78 bpm. Exam Location:  Inpatient Procedure: 2D Echo, Cardiac Doppler, Color Doppler and Strain Analysis Indications:    Chemo  History:        Patient has prior history of Echocardiogram examinations. Risk                  Factors:Hypertension.  Sonographer:    Jyl Heinz Referring Phys: [id number] NI [place]  1. Left ventricular ejection fraction, by estimation, is 60 to 65%. The left ventricle has normal function. The left ventricle has no regional wall motion abnormalities. There is mild left ventricular hypertrophy. Left ventricular diastolic parameters are consistent with Grade I diastolic dysfunction (impaired relaxation). The average left ventricular global longitudinal strain is -18.2 %. The global longitudinal strain is normal.  2. Right ventricular systolic function is normal. The right ventricular size is normal. Tricuspid regurgitation signal is inadequate for assessing PA pressure.  3. The mitral valve is normal in structure. Trivial mitral valve regurgitation. No evidence of mitral stenosis.  4. The aortic valve is tricuspid. Aortic valve regurgitation is mild to moderate. No aortic stenosis is present. FINDINGS  Left Ventricle: Left ventricular ejection fraction, by estimation, is 60 to 65%. The left ventricle has normal function. The left ventricle has no regional wall motion abnormalities. The average left ventricular global longitudinal strain is -18.2 %. The  global longitudinal strain is normal. The left ventricular internal cavity size was normal in size. There is mild left ventricular hypertrophy. Left ventricular diastolic parameters are consistent with Grade I diastolic dysfunction (impaired relaxation). Normal left ventricular filling pressure. Right Ventricle: The right ventricular size is normal. No increase in right ventricular wall thickness. Right ventricular systolic function is normal. Tricuspid regurgitation signal is inadequate for assessing PA pressure. Left Atrium: Left atrial size was normal in size. Right Atrium: Right atrial size was normal in size. Pericardium: There is no evidence of pericardial effusion. Mitral Valve: The mitral valve is normal in structure. Trivial mitral valve  regurgitation. No evidence of mitral valve stenosis. Tricuspid Valve: The tricuspid valve is normal in structure. Tricuspid valve regurgitation is not demonstrated. No evidence of tricuspid stenosis. Aortic Valve: The aortic valve is tricuspid. Aortic valve regurgitation is mild to moderate. Aortic regurgitation PHT measures 620 msec. No aortic stenosis is present. Aortic valve mean gradient measures 4.9 mmHg. Aortic valve peak gradient measures 8.2 mmHg. Aortic valve area, by VTI measures 2.25 cm. Pulmonic Valve: The pulmonic valve was not well visualized. Pulmonic valve regurgitation is not visualized. No evidence of pulmonic stenosis. Aorta: The aortic root is normal in size and structure. Venous: The inferior vena cava was not well visualized. IAS/Shunts: No atrial level shunt detected by color flow Doppler. LEFT VENTRICLE PLAX 2D LVIDd:         4.20 cm      Diastology LVIDs:         2.60 cm      LV e' medial:    5.87 cm/s LV PW:         1.10 cm      LV E/e' medial:  9.7 LV IVS:        1.20 cm      LV e' lateral:   9.14 cm/s LVOT diam:     2.00 cm      LV E/e' lateral: 6.2 LV SV:         66 LV SV Index:   32           2D Longitudinal Strain LVOT Area:     3.14 cm     2D Strain GLS Avg:     -18.2 %  LV Volumes (MOD) LV vol d, MOD A2C: 85.0 ml  3D Volume EF: LV vol d, MOD A4C: 106.0 ml 3D EF:        59 % LV vol s, MOD A2C: 36.0 ml  LV EDV:       130 ml LV vol s, MOD A4C: 45.2 ml  LV ESV:       53 ml LV SV MOD A2C:     49.0 ml  LV SV:        77 ml LV SV MOD A4C:     106.0 ml LV SV MOD BP:      56.9 ml RIGHT VENTRICLE             IVC RV Basal diam:  3.00 cm     IVC diam: 1.10 cm RV Mid diam:    2.80 cm RV S prime:     11.00 cm/s TAPSE (M-mode): 2.1 cm LEFT ATRIUM             Index        RIGHT ATRIUM           Index LA diam:        3.00 cm 1.44 cm/m  RA Area:     12.00 cm LA Vol (A2C):   37.5 ml 17.98 ml/m  RA Volume:   25.00 ml  11.99 ml/m LA Vol (A4C):   37.0 ml 17.74 ml/m LA Biplane Vol: 40.6 ml 19.47  ml/m  AORTIC VALVE AV Area (Vmax):    2.30 cm AV Area (Vmean):   2.32 cm AV Area (VTI):     2.25 cm AV Vmax:           143.38 cm/s AV Vmean:          102.712 cm/s AV VTI:            0.293 m AV Peak Grad:      8.2 mmHg AV Mean Grad:      4.9 mmHg LVOT Vmax:         105.00 cm/s LVOT Vmean:        75.800 cm/s LVOT VTI:          0.210 m LVOT/AV VTI ratio: 0.72 AI PHT:            620 msec  AORTA Ao Root diam: 2.90 cm Ao Asc diam:  2.80 cm MITRAL VALVE MV Area (PHT): 4.21 cm    SHUNTS MV Decel Time: 180 msec    Systemic VTI:  0.21 m MV E velocity: 56.90 cm/s  Systemic Diam: 2.00 cm MV A velocity: 67.30 cm/s MV E/A ratio:  0.85 Carlyle Dolly MD Electronically signed by Carlyle Dolly MD Signature Date/Time: 06/30/2021/12:53:01 PM    Final     Anti-infectives: Anti-infectives (From admission, onward)    None       Assessment/Plan: 81 yo female with recurrent ovarian cancer, undergoing chemotherapy, presented with small bowel obstruction, which is now resolved. - Continue soft low fiber diet, patient instructed to remain on this diet for 2 weeks, and may then resume regular diet as tolerated - Ok for discharge home from a surgical standpoint. We will sign off, please call with any further questions or concerns.   LOS: 5 days    Dwan Bolt 07/01/2021

## 2021-07-02 ENCOUNTER — Telehealth: Payer: Self-pay

## 2021-07-02 ENCOUNTER — Other Ambulatory Visit (HOSPITAL_BASED_OUTPATIENT_CLINIC_OR_DEPARTMENT_OTHER): Payer: Medicare Other

## 2021-07-02 NOTE — Telephone Encounter (Signed)
-----   Message from [name], MD sent at 07/02/2021  8:42 AM EST ----- She got discharged yesterday from hospital for bowel obstruction Can you call and check on her?

## 2021-07-02 NOTE — Telephone Encounter (Signed)
Called and given below message. She verbalized understanding. She is up walking around a little. She had a good bowel movement today. She slept good last night and took a shower. She has a little abdominal pain prior to bm but it is better after bm.

## 2021-07-06 LAB — CULTURE, BLOOD (ROUTINE X 2)
Culture: NO GROWTH
Culture: NO GROWTH
Special Requests: ADEQUATE

## 2021-07-11 DIAGNOSIS — I899 Noninfective disorder of lymphatic vessels and lymph nodes, unspecified: Secondary | ICD-10-CM | POA: Diagnosis not present

## 2021-07-11 DIAGNOSIS — D225 Melanocytic nevi of trunk: Secondary | ICD-10-CM | POA: Diagnosis not present

## 2021-07-11 DIAGNOSIS — D485 Neoplasm of uncertain behavior of skin: Secondary | ICD-10-CM | POA: Diagnosis not present

## 2021-07-18 ENCOUNTER — Encounter: Payer: Self-pay | Admitting: Hematology and Oncology

## 2021-07-21 MED FILL — Fosaprepitant Dimeglumine For IV Infusion 150 MG (Base Eq): INTRAVENOUS | Qty: 5 | Status: AC

## 2021-07-21 MED FILL — Dexamethasone Sodium Phosphate Inj 100 MG/10ML: INTRAMUSCULAR | Qty: 1 | Status: AC

## 2021-07-22 ENCOUNTER — Other Ambulatory Visit: Payer: Self-pay | Admitting: Hematology and Oncology

## 2021-07-22 ENCOUNTER — Inpatient Hospital Stay: Payer: Medicare Other | Attending: Gynecologic Oncology

## 2021-07-22 ENCOUNTER — Inpatient Hospital Stay: Payer: Medicare Other

## 2021-07-22 ENCOUNTER — Encounter: Payer: Self-pay | Admitting: Hematology and Oncology

## 2021-07-22 ENCOUNTER — Other Ambulatory Visit: Payer: Self-pay

## 2021-07-22 ENCOUNTER — Inpatient Hospital Stay: Payer: Medicare Other | Admitting: Hematology and Oncology

## 2021-07-22 VITALS — HR 84 | Resp 18

## 2021-07-22 DIAGNOSIS — R7989 Other specified abnormal findings of blood chemistry: Secondary | ICD-10-CM | POA: Insufficient documentation

## 2021-07-22 DIAGNOSIS — Z90721 Acquired absence of ovaries, unilateral: Secondary | ICD-10-CM | POA: Diagnosis not present

## 2021-07-22 DIAGNOSIS — D61818 Other pancytopenia: Secondary | ICD-10-CM | POA: Diagnosis not present

## 2021-07-22 DIAGNOSIS — C786 Secondary malignant neoplasm of retroperitoneum and peritoneum: Secondary | ICD-10-CM | POA: Insufficient documentation

## 2021-07-22 DIAGNOSIS — C561 Malignant neoplasm of right ovary: Secondary | ICD-10-CM | POA: Insufficient documentation

## 2021-07-22 DIAGNOSIS — Z79899 Other long term (current) drug therapy: Secondary | ICD-10-CM | POA: Diagnosis not present

## 2021-07-22 DIAGNOSIS — Z7901 Long term (current) use of anticoagulants: Secondary | ICD-10-CM | POA: Diagnosis not present

## 2021-07-22 DIAGNOSIS — I2699 Other pulmonary embolism without acute cor pulmonale: Secondary | ICD-10-CM | POA: Diagnosis not present

## 2021-07-22 DIAGNOSIS — N183 Chronic kidney disease, stage 3 unspecified: Secondary | ICD-10-CM | POA: Diagnosis not present

## 2021-07-22 DIAGNOSIS — Z9221 Personal history of antineoplastic chemotherapy: Secondary | ICD-10-CM | POA: Diagnosis not present

## 2021-07-22 DIAGNOSIS — Z7189 Other specified counseling: Secondary | ICD-10-CM

## 2021-07-22 DIAGNOSIS — I1310 Hypertensive heart and chronic kidney disease without heart failure, with stage 1 through stage 4 chronic kidney disease, or unspecified chronic kidney disease: Secondary | ICD-10-CM | POA: Insufficient documentation

## 2021-07-22 DIAGNOSIS — C569 Malignant neoplasm of unspecified ovary: Secondary | ICD-10-CM

## 2021-07-22 DIAGNOSIS — Z5111 Encounter for antineoplastic chemotherapy: Secondary | ICD-10-CM | POA: Diagnosis not present

## 2021-07-22 LAB — CBC WITH DIFFERENTIAL (CANCER CENTER ONLY)
Abs Immature Granulocytes: 0.01 10*3/uL (ref 0.00–0.07)
Basophils Absolute: 0 10*3/uL (ref 0.0–0.1)
Basophils Relative: 0 %
Eosinophils Absolute: 0 10*3/uL (ref 0.0–0.5)
Eosinophils Relative: 0 %
HCT: 26.6 % — ABNORMAL LOW (ref 36.0–46.0)
Hemoglobin: 8.9 g/dL — ABNORMAL LOW (ref 12.0–15.0)
Immature Granulocytes: 0 %
Lymphocytes Relative: 43 %
Lymphs Abs: 1.3 10*3/uL (ref 0.7–4.0)
MCH: 37.7 pg — ABNORMAL HIGH (ref 26.0–34.0)
MCHC: 33.5 g/dL (ref 30.0–36.0)
MCV: 112.7 fL — ABNORMAL HIGH (ref 80.0–100.0)
Monocytes Absolute: 0.5 10*3/uL (ref 0.1–1.0)
Monocytes Relative: 16 %
Neutro Abs: 1.3 10*3/uL — ABNORMAL LOW (ref 1.7–7.7)
Neutrophils Relative %: 41 %
Platelet Count: 133 10*3/uL — ABNORMAL LOW (ref 150–400)
RBC: 2.36 MIL/uL — ABNORMAL LOW (ref 3.87–5.11)
RDW: 16.5 % — ABNORMAL HIGH (ref 11.5–15.5)
WBC Count: 3.2 10*3/uL — ABNORMAL LOW (ref 4.0–10.5)
nRBC: 0.9 % — ABNORMAL HIGH (ref 0.0–0.2)

## 2021-07-22 LAB — CMP (CANCER CENTER ONLY)
ALT: 14 U/L (ref 0–44)
AST: 16 U/L (ref 15–41)
Albumin: 3.8 g/dL (ref 3.5–5.0)
Alkaline Phosphatase: 86 U/L (ref 38–126)
Anion gap: 5 (ref 5–15)
BUN: 19 mg/dL (ref 8–23)
CO2: 29 mmol/L (ref 22–32)
Calcium: 9.7 mg/dL (ref 8.9–10.3)
Chloride: 102 mmol/L (ref 98–111)
Creatinine: 0.88 mg/dL (ref 0.44–1.00)
GFR, Estimated: >60 mL/min (ref >60)
Glucose, Bld: 131 mg/dL — ABNORMAL HIGH (ref 70–99)
Potassium: 4.2 mmol/L (ref 3.5–5.1)
Sodium: 136 mmol/L (ref 135–145)
Total Bilirubin: 0.3 mg/dL (ref 0.3–1.2)
Total Protein: 6.8 g/dL (ref 6.5–8.1)

## 2021-07-22 MED ORDER — PALONOSETRON HCL INJECTION 0.25 MG/5ML
0.2500 mg | Freq: Once | INTRAVENOUS | Status: AC
  Administered 2021-07-22: 11:00:00 0.25 mg via INTRAVENOUS
  Filled 2021-07-22: qty 5

## 2021-07-22 MED ORDER — SODIUM CHLORIDE 0.9 % IV SOLN
150.0000 mg | Freq: Once | INTRAVENOUS | Status: AC
  Administered 2021-07-22: 11:00:00 150 mg via INTRAVENOUS
  Filled 2021-07-22: qty 150

## 2021-07-22 MED ORDER — LIDOCAINE-PRILOCAINE 2.5-2.5 % EX CREA: 1.0000 "application " | TOPICAL_CREAM | Freq: Every day | CUTANEOUS | 3 refills | Status: DC | PRN

## 2021-07-22 MED ORDER — SODIUM CHLORIDE 0.9 % IV SOLN
390.0000 mg | Freq: Once | INTRAVENOUS | Status: AC
  Administered 2021-07-22: 13:00:00 390 mg via INTRAVENOUS
  Filled 2021-07-22: qty 39

## 2021-07-22 MED ORDER — TRAMADOL HCL 50 MG PO TABS: 50.0000 mg | ORAL_TABLET | Freq: Four times a day (QID) | ORAL | 0 refills | Status: DC | PRN

## 2021-07-22 MED ORDER — SODIUM CHLORIDE 0.9 % IV SOLN
10.0000 mg | Freq: Once | INTRAVENOUS | Status: AC
  Administered 2021-07-22: 11:00:00 10 mg via INTRAVENOUS
  Filled 2021-07-22: qty 10

## 2021-07-22 MED ORDER — DEXTROSE 5 % IV SOLN: Freq: Once | INTRAVENOUS | Status: AC

## 2021-07-22 MED ORDER — SODIUM CHLORIDE 0.9% FLUSH
10.0000 mL | Freq: Once | INTRAVENOUS | Status: AC
  Administered 2021-07-22: 10:00:00 10 mL

## 2021-07-22 MED ORDER — DOXORUBICIN HCL LIPOSOMAL CHEMO INJECTION 2 MG/ML
28.0000 mg/m2 | Freq: Once | INTRAVENOUS | Status: AC
  Administered 2021-07-22: 12:00:00 60 mg via INTRAVENOUS
  Filled 2021-07-22: qty 25

## 2021-07-22 MED ORDER — SODIUM CHLORIDE 0.9% FLUSH
10.0000 mL | INTRAVENOUS | Status: DC | PRN
  Administered 2021-07-22: 14:00:00 10 mL

## 2021-07-22 MED ORDER — HEPARIN SOD (PORK) LOCK FLUSH 100 UNIT/ML IV SOLN
500.0000 [IU] | Freq: Once | INTRAVENOUS | Status: AC | PRN
  Administered 2021-07-22: 14:00:00 500 [IU]

## 2021-07-22 NOTE — Assessment & Plan Note (Signed)
She had persistent pancytopenia due to recent treatment but otherwise asymptomatic We will proceed with treatment without delay She does not need transfusion support or G-CSF support

## 2021-07-22 NOTE — Assessment & Plan Note (Signed)
She has mild intermittent elevated serum creatinine Continue aggressive fluid hydration and risk factor modification

## 2021-07-22 NOTE — Patient Instructions (Signed)
[facility]  Discharge Instructions: Thank you for choosing [facility] to provide your oncology and hematology care.   If you have a lab appointment with the [facility], please go directly to the [facility] and check in at the registration area.   Wear comfortable clothing and clothing appropriate for easy access to any Portacath or PICC line.   We strive to give you quality time with your provider. You may need to reschedule your appointment if you arrive late (15 or more minutes).  Arriving late affects you and other patients whose appointments are after yours.  Also, if you miss three or more appointments without notifying the office, you may be dismissed from the clinic at the providers discretion.      For prescription refill requests, have your pharmacy contact our office and allow 72 hours for refills to be completed.    Today you received the following chemotherapy and/or immunotherapy agents: Doxorubicin and Carboplatin.    To help prevent nausea and vomiting after your treatment, we encourage you to take your nausea medication as directed.  BELOW ARE SYMPTOMS THAT SHOULD BE REPORTED IMMEDIATELY: *FEVER GREATER THAN 100.4 F (38 C) OR HIGHER *CHILLS OR SWEATING *NAUSEA AND VOMITING THAT IS NOT CONTROLLED WITH YOUR NAUSEA MEDICATION *UNUSUAL SHORTNESS OF BREATH *UNUSUAL BRUISING OR BLEEDING *URINARY PROBLEMS (pain or burning when urinating, or frequent urination) *BOWEL PROBLEMS (unusual diarrhea, constipation, pain near the anus) TENDERNESS IN MOUTH AND THROAT WITH OR WITHOUT PRESENCE OF ULCERS (sore throat, sores in mouth, or a toothache) UNUSUAL RASH, SWELLING OR PAIN  UNUSUAL VAGINAL DISCHARGE OR ITCHING   Items with * indicate a potential emergency and should be followed up as soon as possible or go to the Emergency Department if any problems should occur.  Please show the CHEMOTHERAPY ALERT CARD or IMMUNOTHERAPY ALERT  CARD at check-in to the Emergency Department and triage nurse.  Should you have questions after your visit or need to cancel or reschedule your appointment, please contact [facility]  Dept: [phone]  and follow the prompts.  Office hours are 8:00 a.m. to 4:30 p.m. Monday - Friday. Please note that voicemails left after 4:00 p.m. may not be returned until the following business day.  We are closed weekends and major holidays. You have access to a nurse at all times for urgent questions. Please call the main number to the clinic Dept: [phone] and follow the prompts.   For any non-urgent questions, you may also contact your provider using MyChart. We now offer e-Visits for anyone 55 and older to request care online for non-urgent symptoms. For details visit [email or web address].   Also download the MyChart app! Go to the app store, search "MyChart", open the app, select [facility], and log in with your MyChart username and password.  Due to Covid, a mask is required upon entering the hospital/clinic. If you do not have a mask, one will be given to you upon arrival. For doctor visits, patients may have 1 support person aged 48 or older with them. For treatment visits, patients cannot have anyone with them due to current Covid guidelines and our immunocompromised population.

## 2021-07-22 NOTE — Progress Notes (Signed)
[facility] OFFICE PROGRESS NOTE  Patient Care Team: Caryl Bis, MD as PCP - General (Family Medicine) Paula Mink Craige Cotta, RN as Oncology Nurse Navigator (Oncology)  ASSESSMENT & PLAN:  Right ovarian epithelial cancer [facility]) Her recent imaging study performed while she was hospitalized showed stable disease Her bowel obstruction has resolved We will resume chemotherapy today I plan to repeat imaging study again in April for further follow-up  Pancytopenia, acquired [facility]) She had persistent pancytopenia due to recent treatment but otherwise asymptomatic We will proceed with treatment without delay She does not need transfusion support or G-CSF support  CKD (chronic kidney disease), stage III ([place]) She has mild intermittent elevated serum creatinine Continue aggressive fluid hydration and risk factor modification  PE (pulmonary thromboembolism) ([place]) She had 1 episode of vaginal bleeding that has since resolved She will continue anticoagulation therapy indefinitely There is no contraindication to remain on antiplatelet agents or anticoagulants as long as the platelet is greater than 50,000.    No orders of the defined types were placed in this encounter.   All questions were answered. The patient knows to call the clinic with any problems, questions or concerns. The total time spent in the appointment was 30 minutes encounter with patients including review of chart and various tests results, discussions about plan of care and coordination of care plan   [name], MD 07/22/2021 1:39 PM  INTERVAL HISTORY: Please see below for problem oriented charting. she returns for treatment follow-up on carboplatin and Doxil for recurrent ovarian cancer She is almost back to normal since recent hospitalization for bowel obstruction On the first week after she left the hospital, she had 1 episode of vaginal bleeding that has since resolved She denies epistaxis, hematuria or  hematochezia No recent nausea or changes in bowel habits  REVIEW OF SYSTEMS:   Constitutional: Denies fevers, chills or abnormal weight loss Eyes: Denies blurriness of vision Ears, nose, mouth, throat, and face: Denies mucositis or sore throat Respiratory: Denies cough, dyspnea or wheezes Cardiovascular: Denies palpitation, chest discomfort or lower extremity swelling Gastrointestinal:  Denies nausea, heartburn or change in bowel habits Skin: Denies abnormal skin rashes Lymphatics: Denies new lymphadenopathy or easy bruising Neurological:Denies numbness, tingling or new weaknesses Behavioral/Psych: Mood is stable, no new changes  All other systems were reviewed with the patient and are negative.  I have reviewed the past medical history, past surgical history, social history and family history with the patient and they are unchanged from previous note.  ALLERGIES:  has No Known Allergies.  MEDICATIONS:  Current Outpatient Medications  Medication Sig Dispense Refill   traMADol (ULTRAM) 50 MG tablet Take 1 tablet (50 mg total) by mouth every 6 (six) hours as needed. 30 tablet 0   acetaminophen (TYLENOL) 325 MG tablet Take 650 mg by mouth every 6 (six) hours as needed for moderate pain or headache.      Docusate Sodium 100 MG capsule Take 100 mg by mouth 2 (two) times daily as needed for constipation.     ELIQUIS 5 MG TABS tablet TAKE ONE TABLET BY MOUTH TWICE DAILY (Patient taking differently: Take 5 mg by mouth 2 (two) times daily.) 60 tablet 5   fexofenadine (ALLEGRA) 180 MG tablet Take 180 mg by mouth daily as needed for allergies or rhinitis.     levothyroxine (SYNTHROID) 75 MCG tablet Take 1 tablet (75 mcg total) by mouth daily before breakfast. 30 tablet 3   lidocaine-prilocaine (EMLA) cream Apply 1 application topically  daily as needed (port access). 30 g 3   Melatonin 3 MG TABS Take 3 mg by mouth at bedtime as needed (sleep).     ondansetron (ZOFRAN) 8 MG tablet Take 1 tablet (8  mg total) by mouth 2 (two) times daily as needed (Nausea or vomiting). 30 tablet 1   polyethylene glycol (MIRALAX / GLYCOLAX) 17 g packet Take 17 g by mouth daily. 14 each 0   prochlorperazine (COMPAZINE) 10 MG tablet Take 1 tablet (10 mg total) by mouth every 6 (six) hours as needed (Nausea or vomiting). 30 tablet 1   senna-docusate (SENOKOT-S) 8.6-50 MG tablet Take 2 tablets by mouth at bedtime. (Patient taking differently: Take 2 tablets by mouth at bedtime as needed for mild constipation.) 60 tablet 3   No current facility-administered medications for this visit.   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  She has bloating and early satiety. Shortness of breath with walking. She denied bleeding. She reported constipation with pain with defecation and narrowed stools   11/29/2018 Imaging   1. 13 cm complex cystic lesion in the central pelvis, highly suspicious for ovarian cystadenocarcinoma. 2. Diffuse peritoneal carcinomatosis with mild ascites. 3. Mild lymphadenopathy in porta hepatis and right cardiophrenic angle, suspicious for metastatic disease. 4. Moderate right and tiny left pleural effusions   12/05/2018 Tumor Marker   Patient's tumor was tested for the following markers: CA-125 Results of the tumor  marker test revealed 1015.   12/06/2018 Cancer Staging   Staging form: Ovary, Fallopian Tube, and Primary Peritoneal Carcinoma, AJCC 8th Edition - Clinical: FIGO Stage IVA, calculated as Stage IV (cT3c, cN1, cM1) - Signed by [name], MD on 12/06/2018    12/09/2018 Pathology Results   PLEURAL FLUID, RIGHT (SPECIMEN 1 OF 1 COLLECTED 12/09/18): - MALIGNANT CELLS CONSISTENT WITH METASTATIC ADENOCARCINOMA - SEE COMMENT Comment The neoplastic cells are positive for cytokeratin 7 and Pax-8 but negative for cytokeratin 20, TTF-1, CDX-2 and Gata-3. Overall, the phenotype is consistent with the clinical impression of gynecologic primary.    12/15/2018 Procedure   Placement of single lumen port a cath via right internal jugular vein. The catheter tip lies at the cavo-atrial junction. A power injectable port a cath was placed and is ready for immediate use   12/16/2018 - 08/24/2019 Chemotherapy   The patient had carboplatin and taxol for chemotherapy treatment.  She had 7 cycles given neoadjuvant prior to surgery and 3 more cycles after surgery, for a total of 10 cycles of treatment    01/06/2019 Tumor Marker   Patient's tumor was tested for the following markers: CA-125 Results of the tumor marker test revealed 948   02/02/2019 Imaging   1. Massive pulmonary embolism, as discussed above. Given the mildly elevated RV  to LV ratio of 0.95, this is associated with increased risk of morbidity and mortality. 2. Today's study demonstrates a mixed response to therapy. Specifically, while there has been regression of the bulky intraperitoneal metastatic disease and regression of previously noted pleural effusions, the large cystic mass in the central pelvis has increased in size compared to the prior study. 3. New right mild hydroureteronephrosis related to extrinsic compression on the distal third of the right ureter by the patient's large pelvic mass. 4. Scattered small pulmonary nodules (predominantly pleural  based) appear stable compared to prior examinations. These are nonspecific but warrant continued attention on follow-up studies. 5. Aortic atherosclerosis, in addition to three-vessel coronary artery disease. Assessment for potential risk factor modification, dietary therapy or pharmacologic therapy may be warranted, if clinically indicated.   02/02/2019 - 02/04/2019 Hospital Admission   She was admitted the hospital due to DVT and PE   02/03/2019 Imaging   Bilateral venous Doppler US Right: Findings consistent with acute deep vein thrombosis involving the right femoral vein, right popliteal vein, right peroneal veins, right soleal veins, and right gastrocnemius veins. No cystic structure found in the popliteal fossa. Left: There is no evidence of deep vein thrombosis in the lower extremity. No cystic structure found in the popliteal fossa   02/17/2019 Tumor Marker   Patient's tumor was tested for the following markers: CA-125 Results of the tumor marker test revealed 127   03/10/2019 Tumor Marker   Patient's tumor was tested for the following markers: CA-125. Results of the tumor marker test revealed 87.4   03/14/2019 - 03/16/2019 Hospital Admission   She was admitted to the hospital recently for weakness   03/31/2019 Tumor Marker   Patient's tumor was tested for the following markers: CA-125 Results of the tumor marker test revealed 59.2.   04/28/2019 Tumor Marker   Patient's tumor was tested for the following markers: CA-125 Results of the tumor marker test revealed 56.8   05/05/2019 Imaging   1. Interval decrease in size of the large cystic mass in the central pelvis. Mesenteric and omental soft tissue disease shows no substantial interval change. 2. The mild right hydroureteronephrosis seen previously has resolved in the interval. 3. Small residual nonobstructive thrombus identified in the inter lobar pulmonary artery common here into the lateral wall compatible with chronicity. 4. 14 mm  subtle enhancing lesion in the anterior right liver is stable. This may be vascular malformation. Attention on follow-up recommended. 5. Stable appearance of the multiple small bilateral pulmonary nodules. Continued attention on follow-up recommended. 6.  Aortic Atherosclerois (ICD10-170.0)     06/06/2019 Pathology Results   A. OMENTUM, RESECTION: - Metastatic carcinoma. B. RIGHT FALLOPIAN TUBE AND OVARY, SALPINGOOOPHORECTOMY: - High-grade serous carcinoma, spanning 9 cm. - No surface involvement identified. - Fallopian tube involved by carcinoma. - See oncology table. C. PERITONEAL NODULE, EXCISION: - Metastatic carcinoma. ONCOLOGY TABLE: OVARY or FALLOPIAN TUBE or PRIMARY PERITONEUM: Procedure: Right salpingo-oophorectomy, omental resection, and peritoneal biopsy. Specimen Integrity: Intact Tumor Site: Right ovary Ovarian Surface Involvement (required only if applicable): Not identified Tumor Size: 9 cm Histologic Type: High-grade serous carcinoma Histologic Grade: High-grade Implants (required for advanced stage serous/seromucinous borderline tumors only): Omentum, peritoneum. Other Tissue/ Organ Involvement: Right fallopian tube Largest Extrapelvic Peritoneal Focus (required only if applicable): 7.3 cm Peritoneal/Ascitic Fluid: Positive pleural fluid pre neoadjuvant therapy. Treatment Effect (required only for high-grade serous carcinomas): Probable treatment effect present. Regional Lymph Nodes: No lymph nodes submitted or found Pathologic Stage Classification (pTNM, AJCC 8th Edition):  ypT3c, ypNX Representative Tumor Block: B2   06/06/2019 Surgery   Preoperative Diagnosis: stage IV ovarian cancer, s/p neoadjuvant chemotherapy, history of recent PE.  Procedure(s) Performed: Exploratory laparotomy with right salpingo-oophorectomy, omentectomy radical tumor debulking for ovarian cancer .   Surgeon: Thereasa Solo, MD.    Operative Findings:  Omental cake adherent to anterior  abdominal wall and hepatic flexure. 10cm right tube and ovary. Surgically absent uterus and left tube and ovary. Granular nodularity across right diaphragm.    This represented an optimal cytoreduction (R0) with no gross visible disease remaining.    07/06/2019 Tumor Marker   Patient's tumor was tested for the following markers:CA-125 Results of the tumor marker test revealed 16.4   08/10/2019 Genetic Testing   Negative genetic testing. No pathogenic variants identified. VUS in BRCA2 called [id number] identified on the [employer] CancerNext+RNAinsight panel. The report date is 08/10/2019.  The CancerNext+RNAinsight gene panel offered by [employer] [facility] includes sequencing and rearrangement analysis for the following 36 genes: APC*, ATM*, AXIN2, BARD1, BMPR1A, BRCA1*, BRCA2*, BRIP1*, CDH1*, CDK4, CDKN2A, CHEK2*, DICER1, MLH1*, MSH2*, MSH3, MSH6*, MUTYH*, NBN, NF1*, NTHL1, PALB2*, PMS2*, PTEN*, RAD51C*, RAD51D*, RECQL, SMAD4, SMARCA4, STK11 and TP53* (sequencing and deletion/duplication); HOXB13, POLD1 and POLE (sequencing only); EPCAM and GREM1 (deletion/duplication only). DNA and RNA analyses performed for * genes.   HRD (somatic) testing was initially ordered and was not completed due to insufficient amount of tumor sample.    08/24/2019 Tumor Marker   Patient's tumor was tested for the following markers: CA-125 Results of the tumor marker test revealed 12.2   09/20/2019 Imaging   1. Interval debulking, bilateral salpingo oophorectomy and omentectomy with resection of the dominant pelvic cystic lesion and the bandlike soft tissue seen previously in the right mesentery/omentum. Today's study is status shows new postoperative baseline for follow-up. 2. 6 mm gastrohepatic ligament nodule seen on the previous study is stable. Continued attention on follow-up recommended. 3. No new or progressive findings in the chest, abdomen, or pelvis to suggest disease progression. 4. Stable 1.4 cm focus of homogeneous  enhancement in the anterior right liver. Stable since at least 11/29/2018 and likely benign. Continued attention on follow-up recommended. 5. Aortic Atherosclerosis (ICD10-I70.0).   09/20/2019 Tumor Marker   Patient's tumor was tested for the following markers: CA-125 Results of the tumor marker test revealed 11.9   12/26/2019 Tumor Marker   Patient's tumor was tested for the following markers: CA-125 Results of the tumor marker test revealed 16.5   03/25/2020 Imaging   1. Interval progression of peritoneal disease which predominantly involves the serosal surface of the proximal and distal transverse colon, and descending colon. 2. New bilateral inguinal adenopathy. 3. No ascites. 4. Aortic atherosclerosis.   03/25/2020 Tumor Marker   Patient's tumor was tested for the following markers: CA-125 Results of the tumor marker test revealed 249   03/28/2020 Echocardiogram    1. Borderline LV Strain; A2C view represents the most accurate acquisition.. Left ventricular ejection fraction, by estimation, is 60 to 65%. Left ventricular ejection fraction by PLAX is 63 %. The left ventricle has normal function. The left ventricle demonstrates regional wall motion abnormalities (see scoring diagram/findings for description). Left ventricular diastolic parameters were normal.  2. Right ventricular systolic function is normal. The right ventricular size is normal.  3. Left atrial size was mildly dilated.  4. The mitral valve is normal in structure. No evidence of mitral valve regurgitation.  5. The aortic valve is grossly normal. Aortic valve regurgitation is mild.  6. The inferior vena cava is normal in size with greater than 50% respiratory variability, suggesting right atrial pressure of 3 mmHg.   04/02/2020 Tumor Marker   Patient's tumor was tested for the following markers: CA-125 Results of the tumor marker test revealed 304   04/02/2020 - 08/22/2020 Chemotherapy   The patient had carboplatin and  Doxil for chemotherapy treatment.     05/27/2020 Tumor Marker   Patient's tumor was tested for the following markers: CA-125. Results of the tumor marker test revealed 42.7   06/10/2020 Imaging   Improving peritoneal disease and serosal implants along the left colon, as above.   Improving bilateral inguinal nodes.   06/18/2020 Echocardiogram    1. Left ventricular ejection fraction, by estimation, is 60 to 65%. The left ventricle has normal function. The left ventricle has no regional wall motion abnormalities. There is mild left ventricular hypertrophy of the basal-septal segment. Left ventricular diastolic parameters were normal. The average left ventricular global longitudinal strain is -25.0 %. The global longitudinal strain is normal.  2. Right ventricular systolic function is normal. The right ventricular size is normal.  3. The mitral valve is grossly normal. Mild mitral valve regurgitation.  4. Tricuspid valve regurgitation is moderate.  5. The aortic valve is tricuspid. There is mild calcification of the aortic valve. There is mild thickening of the aortic valve. Aortic valve regurgitation is mild to moderate.  6. The inferior vena cava is normal in size with <50% respiratory variability, suggesting right atrial pressure of 8 mmHg.   06/25/2020 Tumor Marker   Patient's tumor was tested for the following markers: CA-125 Results of the tumor marker test revealed 23.9   07/15/2020 Imaging   IMPRESSION: 1. Mild amount of acute pulmonary embolism within multiple middle lobe and lower lobe branches of the right pulmonary artery. 2.   Small amount of chronic right pulmonary embolism. 3.   Mild posterior left basilar atelectasis and/or infiltrate. 4.   Small left pleural effusion. 5. Aortic atherosclerosis.   Aortic Atherosclerosis (ICD10-I70.0).     07/15/2020 - 07/17/2020 Hospital Admission   She was admitted to a local hospital after presentation with chest pain and shortness of  breath and was found to have significant pulmonary embolism She was anticoagulated and discharged   07/16/2020 Echocardiogram   1. Left ventricular ejection fraction, by estimation, is 60 to 65%. The left ventricle has normal function. The left ventricle has no regional wall motion abnormalities. There is mild left ventricular hypertrophy.  Left ventricular diastolic parameters are indeterminate.   2. Right ventricular systolic function is normal. The right ventricular size is normal. There is normal pulmonary artery systolic pressure. The estimated right ventricular systolic pressure is 88.8 mmHg.   3. The mitral valve is grossly normal. Trivial mitral valve regurgitation.   4. The aortic valve is tricuspid. Aortic valve regurgitation is mild.   5. The inferior vena cava is normal in size with greater than 50% respiratory variability, suggesting right atrial pressure of 3 mmHg.    07/25/2020 Tumor Marker   Patient's tumor was tested for the following markers: CA-125 Results of the tumor marker test revealed 36.4   09/09/2020 Imaging   1. Mild improvement in peritoneal and serosal metastasis along the splenic flexure and descending colon. 2. Inguinal lymph nodes are slightly increased in size. Lymph nodes increased in size from 06/10/2020 and similar to CT of 03/25/2020. 3. No new peritoneal disease.  No free fluid   09/21/2020 - 10/21/2020 Chemotherapy  She started taking Niraparib, discontinued due to intolerable side effects from headache and hypertension       10/01/2020 Tumor Marker   Patient's tumor was tested for the following markers: CA-125 Results of the tumor marker test revealed 83.4.   10/16/2020 Imaging   No acute findings in the abdomen or pelvis.   Hepatic steatosis.   Large stool burden throughout the colon.       12/03/2020 Tumor Marker   Patient's tumor was tested for the following markers: CA-125 Results of the tumor marker test revealed 146   12/20/2020 Imaging    1. Signs of peritoneal carcinomatosis. No significant interval change from previous exam. 2. No findings of solid organ metastasis or nodal metastasis within the abdomen or pelvis. Enlarged right inguinal node is mildly increased in size. Stable enlarged left inguinal node. 3. No evidence for metastatic disease to the chest. 4. Aortic atherosclerosis. Coronary artery calcifications.   12/27/2020 - 03/07/2021 Chemotherapy    Patient is on PACLITAXEL       02/07/2021 Tumor Marker   Patient's tumor was tested for the following markers: CA-125. Results of the tumor marker test revealed 395.   02/28/2021 Tumor Marker   Patient's tumor was tested for the following markers: CA-125. Results of the tumor marker test revealed 548.   03/20/2021 Imaging   1. Interval enlargement of multiple peritoneal soft tissue masses and nodules. 2. Interval enlargement of portacaval lymph nodes as well as slight interval enlargement of bilateral inguinal lymph nodes. 3. Findings are consistent with worsened nodal and peritoneal metastatic disease. 4. Status post hysterectomy.   Aortic Atherosclerosis (ICD10-I70.0).   03/27/2021 Echocardiogram   1. Left ventricular ejection fraction, by estimation, is 60 to 65%. The left ventricle has normal function. The left ventricle has no regional wall motion abnormalities. There is mild asymmetric left ventricular hypertrophy of the basal-septal segment. Left ventricular diastolic parameters were normal.  2. Right ventricular systolic function is normal. The right ventricular size is normal.  3. The mitral valve is normal in structure. Trivial mitral valve regurgitation. No evidence of mitral stenosis.  4. The aortic valve is tricuspid. Aortic valve regurgitation is moderate. No aortic stenosis is present.  5. The inferior vena cava is normal in size with greater than 50% respiratory variability, suggesting right atrial pressure of 3 mmHg.   03/28/2021 -  Chemotherapy    Patient is on Treatment Plan : OVARIAN RECURRENT Liposomal Doxorubicin + Carboplatin q28d X 6 Cycles     03/28/2021 Tumor Marker   Patient's tumor was tested for the following markers: CA-125. Results of the tumor marker test revealed 991.   04/28/2021 Tumor Marker   Patient's tumor was tested for the following markers: CA-125. Results of the tumor marker test revealed 992.   06/16/2021 Imaging   Improving abdominopelvic lymphadenopathy, including a dominant 16 mm short axis right inguinal node, as above.   Improving peritoneal carcinomatosis, including a dominant 1.2 x 4.6 cm implant beneath the right mid abdominal wall.   06/25/2021 - 07/01/2021 Hospital Admission   She was hospitalized for subacute bowel obstruction, managed conservatively   06/30/2021 Echocardiogram   1. Left ventricular ejection fraction, by estimation, is 60 to 65%. The left ventricle has normal function. The left ventricle has no regional wall motion abnormalities. There is mild left ventricular hypertrophy. Left ventricular diastolic parameters are consistent with Grade I diastolic dysfunction (impaired relaxation). The average left ventricular global longitudinal strain is -18.2 %. The global longitudinal strain is normal.  2. Right ventricular systolic function is normal. The right ventricular size is normal. Tricuspid regurgitation signal is inadequate for assessing PA pressure.   3. The mitral valve is normal in structure. Trivial mitral valve regurgitation. No evidence of mitral stenosis.   4. The aortic valve is tricuspid. Aortic valve regurgitation is mild to moderate. No aortic stenosis is present.      PHYSICAL EXAMINATION: ECOG PERFORMANCE STATUS: 1 - Symptomatic but completely ambulatory  Vitals:   07/22/21 0948  BP: (!) 140/55  Temp: 97.9 F (36.6 C)  SpO2: 96%   Filed Weights   07/22/21 0948  Weight: 217 lb (98.4 kg)    GENERAL:alert, no distress and comfortable SKIN: skin color, texture,  turgor are normal, no rashes or significant lesions EYES: normal, Conjunctiva are pink and non-injected, sclera clear OROPHARYNX:no exudate, no erythema and lips, buccal mucosa, and tongue normal  NECK: supple, thyroid normal size, non-tender, without nodularity LYMPH:  no palpable lymphadenopathy in the cervical, axillary or inguinal LUNGS: clear to auscultation and percussion with normal breathing effort HEART: regular rate & rhythm and no murmurs and no lower extremity edema ABDOMEN:abdomen soft, non-tender and normal bowel sounds Musculoskeletal:no cyanosis of digits and no clubbing  NEURO: alert & oriented x 3 with fluent speech, no focal motor/sensory deficits  LABORATORY DATA:  I have reviewed the data as listed    Component Value Date/Time   NA 136 07/22/2021 0928   K 4.2 07/22/2021 0928   CL 102 07/22/2021 0928   CO2 29 07/22/2021 0928   GLUCOSE 131 (H) 07/22/2021 0928   BUN 19 07/22/2021 0928   CREATININE 0.88 07/22/2021 0928   CALCIUM 9.7 07/22/2021 0928   PROT 6.8 07/22/2021 0928   ALBUMIN 3.8 07/22/2021 0928   AST 16 07/22/2021 0928   ALT 14 07/22/2021 0928   ALKPHOS 86 07/22/2021 0928   BILITOT 0.3 07/22/2021 0928   GFRNONAA >60 07/22/2021 0928   GFRAA >60 03/25/2020 1144    No results found for: SPEP, UPEP  Lab Results  Component Value Date   WBC 3.2 (L) 07/22/2021   NEUTROABS 1.3 (L) 07/22/2021   HGB 8.9 (L) 07/22/2021   HCT 26.6 (L) 07/22/2021   MCV 112.7 (H) 07/22/2021   PLT 133 (L) 07/22/2021      Chemistry      Component Value Date/Time   NA 136 07/22/2021 0928   K 4.2 07/22/2021 0928   CL 102 07/22/2021 0928   CO2 29 07/22/2021 0928   BUN 19 07/22/2021 0928   CREATININE 0.88 07/22/2021 0928      Component Value Date/Time   CALCIUM 9.7 07/22/2021 0928   ALKPHOS 86 07/22/2021 0928   AST 16 07/22/2021 0928   ALT 14 07/22/2021 0928   BILITOT 0.3 07/22/2021 0928       RADIOGRAPHIC STUDIES: I have personally reviewed the radiological  images as listed and agreed with the findings in the report. DG Abd 1 View  Result Date: 06/28/2021 CLINICAL DATA:  Small-bowel obstruction.  Follow-up exam. EXAM: ABDOMEN - 1 VIEW COMPARISON:  06/27/2021 and older exams. FINDINGS: Residual contrast is noted a mostly decompressed colon. Several prominent small bowel loops are noted centrally similar to the prior study. Nasogastric tube is stable and well positioned. IMPRESSION: 1. Prominent central small bowel loops similar to the previous day's exam. Findings suggest a mild residual low-grade small bowel obstruction. Electronically Signed   By: Lajean Manes M.D.   On: 06/28/2021 17:14   CT ABDOMEN PELVIS W  CONTRAST  Result Date: 06/26/2021 CLINICAL DATA:  Abdominal pain. History of right ovarian cancer status post TAH/BSO and complete chemotherapy. EXAM: CT ABDOMEN AND PELVIS WITH CONTRAST TECHNIQUE: Multidetector CT imaging of the abdomen and pelvis was performed using the standard protocol following bolus administration of intravenous contrast. CONTRAST:  60m OMNIPAQUE IOHEXOL 350 MG/ML SOLN COMPARISON:  CT abdomen pelvis dated 06/13/2021. FINDINGS: Lower chest: Bibasilar linear atelectasis/scarring. The visualized lung bases are otherwise clear. Coronary vascular calcifications. No intra-abdominal free air.  Small free fluid within the pelvis. Hepatobiliary: No focal liver abnormality is seen. No gallstones, gallbladder wall thickening, or biliary dilatation. Pancreas: Unremarkable. No pancreatic ductal dilatation or surrounding inflammatory changes. Spleen: Normal in size without focal abnormality. Adrenals/Urinary Tract: The adrenal glands are unremarkable. There is mild bilateral renal parenchyma atrophy. There is no hydronephrosis on either side. There is symmetric enhancement and excretion of contrast by both kidneys. Small bilateral parapelvic cysts. The visualized ureters appear unremarkable. The urinary bladder is collapsed. Stomach/Bowel:  Multiple mildly dilated and fecalized small bowel loops in the lower abdomen noted measuring up to 3 cm. There is thickened and inflamed appearance of distal small bowel within the pelvis. A transition is noted in the right lower quadrant (58/2 and 66/4). Findings most consistent with developing small-bowel obstruction with possible associated enteritis. There is abutment of loops of small bowel to the anterior peritoneal wall consistent with adhesions. There is a stone at the base of the appendix. No inflammatory changes. Vascular/Lymphatic: Moderate aortoiliac atherosclerotic disease. The IVC is unremarkable. No portal venous gas. There is no adenopathy. Reproductive: Hysterectomy. No adnexal masses. A pessary is noted in the pelvis. Other: Midline vertical anterior pelvic wall incisional scar. Musculoskeletal: Osteopenia with degenerative changes of the spine. No acute osseous pathology. IMPRESSION: 1. Findings most consistent with developing small-bowel obstruction with transition in the right lower quadrant. Possible associated enteritis. 2. Aortic Atherosclerosis (ICD10-I70.0). Electronically Signed   By: AAnner CreteM.D.   On: 06/26/2021 02:43   DG Abd Portable 1V-Small Bowel Obstruction Protocol-24 hr delay  Result Date: 06/27/2021 CLINICAL DATA:  Twenty-four hour delay images for small bowel obstruction EXAM: PORTABLE ABDOMEN - 1 VIEW COMPARISON:  06/26/2021 FINDINGS: Contrast material is demonstrated in the right colon suggesting no evidence of complete obstruction. Gas-filled mildly dilated mid abdominal small bowel loops are identified. Enteric tube tip in the left upper quadrant consistent with location in the upper stomach. IMPRESSION: Contrast material demonstrated in the right colon suggesting no evidence of complete obstruction. Electronically Signed   By: WLucienne CapersM.D.   On: 06/27/2021 15:22   DG Abd Portable 1V-Small Bowel Obstruction Protocol-initial, 8 hr delay  Result  Date: 06/26/2021 CLINICAL DATA:  Small bowel protocol.  8 hours post-contrast film. EXAM: PORTABLE ABDOMEN - 1 VIEW COMPARISON:  June 26, 2021 (9:03 a.m.) FINDINGS: A nasogastric tube is seen with its distal tip noted within the body of the stomach. The distal side hole sits approximately 4.6 cm distal to the gastroesophageal junction. A paucity of bowel gas is noted throughout the abdomen. Radiopaque contrast is not clearly identified within the large or small bowel and may be markedly diluted. No radio-opaque calculi or other significant radiographic abnormality are seen. IMPRESSION: 1. Nasogastric tube positioning, as described above. 2. Radiopaque contrast is not clearly identified within the large or small bowel and may be markedly diluted. Electronically Signed   By: TVirgina NorfolkM.D.   On: 06/26/2021 22:12   DG Abd Portable 1V-Small Bowel Protocol-Position Verification  Result Date: 06/26/2021 CLINICAL DATA:  Nasogastric tube placement. EXAM: PORTABLE ABDOMEN - 1 VIEW COMPARISON:  Abdominal CT 06/26/2021. FINDINGS: 0903 hours. Enteric tube projects below the diaphragm, tip at the level of the mid stomach. The tube is mildly kinked at its side hole. A right-sided Port-A-Cath tip projects to the level of the superior cavoatrial junction. Mild atelectasis is present at both lung bases. The visualized bowel gas pattern is normal. IMPRESSION: Enteric tube projects over the mid stomach, slightly kinked at its side hole. Electronically Signed   By: Richardean Sale M.D.   On: 06/26/2021 10:54   ECHOCARDIOGRAM COMPLETE  Result Date: 06/30/2021    ECHOCARDIOGRAM LIMITED REPORT   Patient Name:   Paula Peterson Crean Date of Exam: 06/30/2021 Medical Rec #:  [id number]    Height:       67.0 in Accession #:    [id number]   Weight:       215.0 lb Date of Birth:  [birth year]   BSA:          2.085 m Patient Age:    81 years     BP:           119/61 mmHg Patient Gender: F            HR:           78 bpm. Exam Location:   Inpatient Procedure: 2D Echo, Cardiac Doppler, Color Doppler and Strain Analysis Indications:    Chemo  History:        Patient has prior history of Echocardiogram examinations. Risk                 Factors:Hypertension.  Sonographer:    Jyl Heinz Referring Phys: [id number] Azha Constantin [place]  1. Left ventricular ejection fraction, by estimation, is 60 to 65%. The left ventricle has normal function. The left ventricle has no regional wall motion abnormalities. There is mild left ventricular hypertrophy. Left ventricular diastolic parameters are consistent with Grade I diastolic dysfunction (impaired relaxation). The average left ventricular global longitudinal strain is -18.2 %. The global longitudinal strain is normal.  2. Right ventricular systolic function is normal. The right ventricular size is normal. Tricuspid regurgitation signal is inadequate for assessing PA pressure.  3. The mitral valve is normal in structure. Trivial mitral valve regurgitation. No evidence of mitral stenosis.  4. The aortic valve is tricuspid. Aortic valve regurgitation is mild to moderate. No aortic stenosis is present. FINDINGS  Left Ventricle: Left ventricular ejection fraction, by estimation, is 60 to 65%. The left ventricle has normal function. The left ventricle has no regional wall motion abnormalities. The average left ventricular global longitudinal strain is -18.2 %. The global longitudinal strain is normal. The left ventricular internal cavity size was normal in size. There is mild left ventricular hypertrophy. Left ventricular diastolic parameters are consistent with Grade I diastolic dysfunction (impaired relaxation). Normal left ventricular filling pressure. Right Ventricle: The right ventricular size is normal. No increase in right ventricular wall thickness. Right ventricular systolic function is normal. Tricuspid regurgitation signal is inadequate for assessing PA pressure. Left Atrium: Left atrial size was normal  in size. Right Atrium: Right atrial size was normal in size. Pericardium: There is no evidence of pericardial effusion. Mitral Valve: The mitral valve is normal in structure. Trivial mitral valve regurgitation. No evidence of mitral valve stenosis. Tricuspid Valve: The tricuspid valve is normal in structure. Tricuspid valve regurgitation is not demonstrated. No evidence of tricuspid stenosis. Aortic Valve: The  aortic valve is tricuspid. Aortic valve regurgitation is mild to moderate. Aortic regurgitation PHT measures 620 msec. No aortic stenosis is present. Aortic valve mean gradient measures 4.9 mmHg. Aortic valve peak gradient measures 8.2 mmHg. Aortic valve area, by VTI measures 2.25 cm. Pulmonic Valve: The pulmonic valve was not well visualized. Pulmonic valve regurgitation is not visualized. No evidence of pulmonic stenosis. Aorta: The aortic root is normal in size and structure. Venous: The inferior vena cava was not well visualized. IAS/Shunts: No atrial level shunt detected by color flow Doppler. LEFT VENTRICLE PLAX 2D LVIDd:         4.20 cm      Diastology LVIDs:         2.60 cm      LV e' medial:    5.87 cm/s LV PW:         1.10 cm      LV E/e' medial:  9.7 LV IVS:        1.20 cm      LV e' lateral:   9.14 cm/s LVOT diam:     2.00 cm      LV E/e' lateral: 6.2 LV SV:         66 LV SV Index:   32           2D Longitudinal Strain LVOT Area:     3.14 cm     2D Strain GLS Avg:     -18.2 %  LV Volumes (MOD) LV vol d, MOD A2C: 85.0 ml  3D Volume EF: LV vol d, MOD A4C: 106.0 ml 3D EF:        59 % LV vol s, MOD A2C: 36.0 ml  LV EDV:       130 ml LV vol s, MOD A4C: 45.2 ml  LV ESV:       53 ml LV SV MOD A2C:     49.0 ml  LV SV:        77 ml LV SV MOD A4C:     106.0 ml LV SV MOD BP:      56.9 ml RIGHT VENTRICLE             IVC RV Basal diam:  3.00 cm     IVC diam: 1.10 cm RV Mid diam:    2.80 cm RV S prime:     11.00 cm/s TAPSE (M-mode): 2.1 cm LEFT ATRIUM             Index        RIGHT ATRIUM           Index LA  diam:        3.00 cm 1.44 cm/m   RA Area:     12.00 cm LA Vol (A2C):   37.5 ml 17.98 ml/m  RA Volume:   25.00 ml  11.99 ml/m LA Vol (A4C):   37.0 ml 17.74 ml/m LA Biplane Vol: 40.6 ml 19.47 ml/m  AORTIC VALVE AV Area (Vmax):    2.30 cm AV Area (Vmean):   2.32 cm AV Area (VTI):     2.25 cm AV Vmax:           143.38 cm/s AV Vmean:          102.712 cm/s AV VTI:            0.293 m AV Peak Grad:      8.2 mmHg AV Mean Grad:      4.9 mmHg LVOT Vmax:  105.00 cm/s LVOT Vmean:        75.800 cm/s LVOT VTI:          0.210 m LVOT/AV VTI ratio: 0.72 AI PHT:            620 msec  AORTA Ao Root diam: 2.90 cm Ao Asc diam:  2.80 cm MITRAL VALVE MV Area (PHT): 4.21 cm    SHUNTS MV Decel Time: 180 msec    Systemic VTI:  0.21 m MV E velocity: 56.90 cm/s  Systemic Diam: 2.00 cm MV A velocity: 67.30 cm/s MV E/A ratio:  0.85 Carlyle Dolly MD Electronically signed by Carlyle Dolly MD Signature Date/Time: 06/30/2021/12:53:01 PM    Final

## 2021-07-22 NOTE — Assessment & Plan Note (Signed)
Her recent imaging study performed while she was hospitalized showed stable disease Her bowel obstruction has resolved We will resume chemotherapy today I plan to repeat imaging study again in April for further follow-up

## 2021-07-22 NOTE — Assessment & Plan Note (Signed)
She had 1 episode of vaginal bleeding that has since resolved She will continue anticoagulation therapy indefinitely There is no contraindication to remain on antiplatelet agents or anticoagulants as long as the platelet is greater than 50,000.

## 2021-07-23 ENCOUNTER — Encounter: Payer: Self-pay | Admitting: Hematology and Oncology

## 2021-07-31 DIAGNOSIS — S39012A Strain of muscle, fascia and tendon of lower back, initial encounter: Secondary | ICD-10-CM | POA: Diagnosis not present

## 2021-07-31 DIAGNOSIS — K56609 Unspecified intestinal obstruction, unspecified as to partial versus complete obstruction: Secondary | ICD-10-CM | POA: Diagnosis not present

## 2021-08-18 MED FILL — Dexamethasone Sodium Phosphate Inj 100 MG/10ML: INTRAMUSCULAR | Qty: 1 | Status: AC

## 2021-08-18 MED FILL — Fosaprepitant Dimeglumine For IV Infusion 150 MG (Base Eq): INTRAVENOUS | Qty: 5 | Status: AC

## 2021-08-19 ENCOUNTER — Other Ambulatory Visit: Payer: Self-pay | Admitting: Hematology and Oncology

## 2021-08-19 ENCOUNTER — Inpatient Hospital Stay: Payer: Medicare Other | Admitting: Hematology and Oncology

## 2021-08-19 ENCOUNTER — Inpatient Hospital Stay: Payer: Medicare Other

## 2021-08-19 ENCOUNTER — Inpatient Hospital Stay: Payer: Medicare Other | Attending: Gynecologic Oncology

## 2021-08-19 ENCOUNTER — Encounter: Payer: Self-pay | Admitting: Hematology and Oncology

## 2021-08-19 ENCOUNTER — Other Ambulatory Visit: Payer: Self-pay

## 2021-08-19 VITALS — BP 122/51 | HR 84 | Temp 98.0°F | Resp 18 | Ht 67.0 in | Wt 217.8 lb

## 2021-08-19 DIAGNOSIS — D539 Nutritional anemia, unspecified: Secondary | ICD-10-CM

## 2021-08-19 DIAGNOSIS — D61818 Other pancytopenia: Secondary | ICD-10-CM

## 2021-08-19 DIAGNOSIS — C561 Malignant neoplasm of right ovary: Secondary | ICD-10-CM

## 2021-08-19 DIAGNOSIS — N183 Chronic kidney disease, stage 3 unspecified: Secondary | ICD-10-CM | POA: Insufficient documentation

## 2021-08-19 DIAGNOSIS — Z90721 Acquired absence of ovaries, unilateral: Secondary | ICD-10-CM | POA: Diagnosis not present

## 2021-08-19 DIAGNOSIS — Z7901 Long term (current) use of anticoagulants: Secondary | ICD-10-CM | POA: Diagnosis not present

## 2021-08-19 DIAGNOSIS — Z9221 Personal history of antineoplastic chemotherapy: Secondary | ICD-10-CM | POA: Diagnosis not present

## 2021-08-19 DIAGNOSIS — M1990 Unspecified osteoarthritis, unspecified site: Secondary | ICD-10-CM | POA: Diagnosis not present

## 2021-08-19 DIAGNOSIS — M545 Low back pain, unspecified: Secondary | ICD-10-CM | POA: Diagnosis not present

## 2021-08-19 DIAGNOSIS — I1310 Hypertensive heart and chronic kidney disease without heart failure, with stage 1 through stage 4 chronic kidney disease, or unspecified chronic kidney disease: Secondary | ICD-10-CM | POA: Insufficient documentation

## 2021-08-19 DIAGNOSIS — Z7189 Other specified counseling: Secondary | ICD-10-CM

## 2021-08-19 DIAGNOSIS — Z5111 Encounter for antineoplastic chemotherapy: Secondary | ICD-10-CM | POA: Insufficient documentation

## 2021-08-19 DIAGNOSIS — I2699 Other pulmonary embolism without acute cor pulmonale: Secondary | ICD-10-CM

## 2021-08-19 DIAGNOSIS — C786 Secondary malignant neoplasm of retroperitoneum and peritoneum: Secondary | ICD-10-CM | POA: Diagnosis not present

## 2021-08-19 DIAGNOSIS — M2550 Pain in unspecified joint: Secondary | ICD-10-CM | POA: Diagnosis not present

## 2021-08-19 DIAGNOSIS — Z79899 Other long term (current) drug therapy: Secondary | ICD-10-CM | POA: Insufficient documentation

## 2021-08-19 LAB — CBC WITH DIFFERENTIAL (CANCER CENTER ONLY)
Abs Immature Granulocytes: 0.01 10*3/uL (ref 0.00–0.07)
Basophils Absolute: 0 10*3/uL (ref 0.0–0.1)
Basophils Relative: 0 %
Eosinophils Absolute: 0 10*3/uL (ref 0.0–0.5)
Eosinophils Relative: 0 %
HCT: 24.6 % — ABNORMAL LOW (ref 36.0–46.0)
Hemoglobin: 8.7 g/dL — ABNORMAL LOW (ref 12.0–15.0)
Immature Granulocytes: 0 %
Lymphocytes Relative: 42 %
Lymphs Abs: 1.3 10*3/uL (ref 0.7–4.0)
MCH: 39.7 pg — ABNORMAL HIGH (ref 26.0–34.0)
MCHC: 35.4 g/dL (ref 30.0–36.0)
MCV: 112.3 fL — ABNORMAL HIGH (ref 80.0–100.0)
Monocytes Absolute: 0.6 10*3/uL (ref 0.1–1.0)
Monocytes Relative: 17 %
Neutro Abs: 1.4 10*3/uL — ABNORMAL LOW (ref 1.7–7.7)
Neutrophils Relative %: 41 %
Platelet Count: 85 10*3/uL — ABNORMAL LOW (ref 150–400)
RBC: 2.19 MIL/uL — ABNORMAL LOW (ref 3.87–5.11)
RDW: 15.8 % — ABNORMAL HIGH (ref 11.5–15.5)
WBC Count: 3.3 10*3/uL — ABNORMAL LOW (ref 4.0–10.5)
nRBC: 0.6 % — ABNORMAL HIGH (ref 0.0–0.2)

## 2021-08-19 LAB — CMP (CANCER CENTER ONLY)
ALT: 13 U/L (ref 0–44)
AST: 16 U/L (ref 15–41)
Albumin: 4 g/dL (ref 3.5–5.0)
Alkaline Phosphatase: 92 U/L (ref 38–126)
Anion gap: 7 (ref 5–15)
BUN: 20 mg/dL (ref 8–23)
CO2: 29 mmol/L (ref 22–32)
Calcium: 9.8 mg/dL (ref 8.9–10.3)
Chloride: 100 mmol/L (ref 98–111)
Creatinine: 0.96 mg/dL (ref 0.44–1.00)
GFR, Estimated: >60 mL/min (ref >60)
Glucose, Bld: 110 mg/dL — ABNORMAL HIGH (ref 70–99)
Potassium: 4.1 mmol/L (ref 3.5–5.1)
Sodium: 136 mmol/L (ref 135–145)
Total Bilirubin: 0.5 mg/dL (ref 0.3–1.2)
Total Protein: 7.1 g/dL (ref 6.5–8.1)

## 2021-08-19 LAB — VITAMIN B12: Vitamin B-12: 275 pg/mL (ref 180–914)

## 2021-08-19 MED ORDER — PREDNISONE 5 MG PO TABS: 5.0000 mg | ORAL_TABLET | Freq: Every day | ORAL | 0 refills | Status: DC

## 2021-08-19 MED ORDER — DIPHENHYDRAMINE HCL 50 MG/ML IJ SOLN
25.0000 mg | Freq: Once | INTRAMUSCULAR | Status: AC
  Administered 2021-08-19: 25 mg via INTRAVENOUS
  Filled 2021-08-19: qty 1

## 2021-08-19 MED ORDER — DEXTROSE 5 % IV SOLN: Freq: Once | INTRAVENOUS | Status: AC

## 2021-08-19 MED ORDER — SODIUM CHLORIDE 0.9% FLUSH
10.0000 mL | INTRAVENOUS | Status: DC | PRN
  Administered 2021-08-19: 10 mL

## 2021-08-19 MED ORDER — SODIUM CHLORIDE 0.9 % IV SOLN
150.0000 mg | Freq: Once | INTRAVENOUS | Status: AC
  Administered 2021-08-19: 150 mg via INTRAVENOUS
  Filled 2021-08-19: qty 150

## 2021-08-19 MED ORDER — PALONOSETRON HCL INJECTION 0.25 MG/5ML
0.2500 mg | Freq: Once | INTRAVENOUS | Status: AC
  Administered 2021-08-19: 0.25 mg via INTRAVENOUS
  Filled 2021-08-19: qty 5

## 2021-08-19 MED ORDER — HEPARIN SOD (PORK) LOCK FLUSH 100 UNIT/ML IV SOLN
500.0000 [IU] | Freq: Once | INTRAVENOUS | Status: AC | PRN
  Administered 2021-08-19: 500 [IU]

## 2021-08-19 MED ORDER — SODIUM CHLORIDE 0.9 % IV SOLN
339.5000 mg | Freq: Once | INTRAVENOUS | Status: AC
  Administered 2021-08-19: 340 mg via INTRAVENOUS
  Filled 2021-08-19: qty 34

## 2021-08-19 MED ORDER — SODIUM CHLORIDE 0.9% FLUSH
10.0000 mL | Freq: Once | INTRAVENOUS | Status: AC
  Administered 2021-08-19: 10 mL

## 2021-08-19 MED ORDER — SODIUM CHLORIDE 0.9 % IV SOLN
10.0000 mg | Freq: Once | INTRAVENOUS | Status: AC
  Administered 2021-08-19: 10 mg via INTRAVENOUS
  Filled 2021-08-19: qty 10

## 2021-08-19 MED ORDER — DOXORUBICIN HCL LIPOSOMAL CHEMO INJECTION 2 MG/ML
23.5000 mg/m2 | Freq: Once | INTRAVENOUS | Status: AC
  Administered 2021-08-19: 50 mg via INTRAVENOUS
  Filled 2021-08-19: qty 25

## 2021-08-19 MED ORDER — FAMOTIDINE IN NACL 20-0.9 MG/50ML-% IV SOLN
20.0000 mg | Freq: Once | INTRAVENOUS | Status: AC
  Administered 2021-08-19: 20 mg via INTRAVENOUS
  Filled 2021-08-19: qty 50

## 2021-08-19 NOTE — Progress Notes (Unsigned)
Okay to proceed with abnormal CBC per Dr. Alvy Bimler.

## 2021-08-19 NOTE — Progress Notes (Signed)
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[facility]) She tolerated treatment poorly with worsening pancytopenia We will proceed with treatment with further dose adjustment I will also check a B12 level I plan to delay her next treatment by 1 week and repeat CT imaging next month for further follow-up  Pancytopenia, acquired ([place]) Multifactorial, likely due to side effects of treatment We will proceed without delay I plan dose reduction I will check a vitamin B12 level She does not need transfusion support  PE (pulmonary thromboembolism) ([place]) She had several episodes of vaginal bleeding that has since resolved She will continue anticoagulation therapy indefinitely There is no contraindication to remain on antiplatelet agents or anticoagulants as long as the platelet is greater than 50,000.    Lower back pain She has joint pain and inflammatory arthritis I recommend low-dose prednisone to try   Orders Placed This Encounter  Procedures   CT ABDOMEN PELVIS W CONTRAST    Standing Status:   Future    Standing Expiration Date:   08/19/2022    Order Specific Question:   If indicated for the ordered procedure, I authorize the administration of contrast media per Radiology protocol    Answer:   Yes    Order Specific Question:   Preferred imaging location?    Answer:   [facility]    Order Specific Question:   Radiology Contrast Protocol - do NOT remove file path    Answer:   [email or web address]   Vitamin B12    Standing Status:   Future    Number of Occurrences:   1    Standing Expiration Date:   08/19/2022    All questions were answered. The patient knows to call the clinic with any problems, questions or concerns. The total time spent in the  appointment was 40 minutes encounter with patients including review of chart and various tests results, discussions about plan of care and coordination of care plan   [name], MD 08/19/2021 12:25 PM  INTERVAL HISTORY: Please see below for problem oriented charting. she returns for treatment follow-up on carboplatin and Doxil for recurrent ovarian cancer She has several episodes of brief vaginal spotting She has some recent inflammatory arthritis/bone aches Her energy level is poor She denies recent infection, fever or chills  REVIEW OF SYSTEMS:   Constitutional: Denies fevers, chills or abnormal weight loss Eyes: Denies blurriness of vision Ears, nose, mouth, throat, and face: Denies mucositis or sore throat Respiratory: Denies cough, dyspnea or wheezes Cardiovascular: Denies palpitation, chest discomfort or lower extremity swelling Gastrointestinal:  Denies nausea, heartburn or change in bowel habits Skin: Denies abnormal skin rashes Lymphatics: Denies new lymphadenopathy or easy bruising Neurological:Denies numbness, tingling or new weaknesses Behavioral/Psych: Mood is stable, no new changes  All other systems were reviewed with the patient and are negative.  I have reviewed the past medical history, past surgical history, social history and family history with the patient and they are unchanged from previous note.  ALLERGIES:  has No Known Allergies.  MEDICATIONS:  Current Outpatient Medications  Medication Sig Dispense Refill   predniSONE (DELTASONE) 5 MG tablet Take 1 tablet (5 mg total) by mouth daily with breakfast. 30 tablet 0   acetaminophen (TYLENOL) 325 MG tablet Take 650 mg by mouth every 6 (six) hours as needed for  moderate pain or headache.      atenolol (TENORMIN) 25 MG tablet TAKE ONE (1) TABLET BY MOUTH EVERY DAY 30 tablet 3   Docusate Sodium 100 MG capsule Take 100 mg by mouth 2 (two) times daily as needed for constipation.     ELIQUIS 5 MG TABS tablet TAKE  ONE TABLET BY MOUTH TWICE DAILY (Patient taking differently: Take 5 mg by mouth 2 (two) times daily.) 60 tablet 5   fexofenadine (ALLEGRA) 180 MG tablet Take 180 mg by mouth daily as needed for allergies or rhinitis.     levothyroxine (SYNTHROID) 75 MCG tablet Take 1 tablet (75 mcg total) by mouth daily before breakfast. 30 tablet 3   lidocaine-prilocaine (EMLA) cream Apply 1 application topically daily as needed (port access). 30 g 3   Melatonin 3 MG TABS Take 3 mg by mouth at bedtime as needed (sleep).     ondansetron (ZOFRAN) 8 MG tablet Take 1 tablet (8 mg total) by mouth 2 (two) times daily as needed (Nausea or vomiting). 30 tablet 1   polyethylene glycol (MIRALAX / GLYCOLAX) 17 g packet Take 17 g by mouth daily. 14 each 0   prochlorperazine (COMPAZINE) 10 MG tablet Take 1 tablet (10 mg total) by mouth every 6 (six) hours as needed (Nausea or vomiting). 30 tablet 1   senna-docusate (SENOKOT-S) 8.6-50 MG tablet Take 2 tablets by mouth at bedtime. (Patient taking differently: Take 2 tablets by mouth at bedtime as needed for mild constipation.) 60 tablet 3   No current facility-administered medications for this visit.   Facility-Administered Medications Ordered in Other Visits  Medication Dose Route Frequency Provider Last Rate Last Admin   CARBOplatin (PARAPLATIN) 340 mg in sodium chloride 0.9 % 250 mL chemo infusion  340 mg Intravenous Once Alvy Bimler, Telina Kleckley, MD       diphenhydrAMINE (BENADRYL) injection 25 mg  25 mg Intravenous Once Alvy Bimler, Shelbe Haglund, MD       DOXOrubicin HCL LIPOSOMAL (DOXIL) 50 mg in dextrose 5 % 250 mL chemo infusion  23.5 mg/m2 (Treatment Plan Recorded) Intravenous Once Alvy Bimler, Wilmot Quevedo, MD       famotidine (PEPCID) IVPB 20 mg premix  20 mg Intravenous Once Alvy Bimler, Khloee Garza, MD 200 mL/hr at 08/19/21 1217 20 mg at 08/19/21 1217   heparin lock flush 100 unit/mL  500 Units Intracatheter Once PRN Alvy Bimler, Weslee Fogg, MD       sodium chloride flush (NS) 0.9 % injection 10 mL  10 mL Intracatheter PRN  Alvy Bimler, Khilynn Borntreger, MD        SUMMARY OF ONCOLOGIC HISTORY: Oncology History Overview Note  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  She has bloating and early satiety. Shortness of breath with walking. She denied bleeding. She reported constipation with pain with defecation and narrowed stools   11/29/2018 Imaging   1. 13 cm complex cystic lesion in the central pelvis, highly suspicious for ovarian cystadenocarcinoma. 2. Diffuse peritoneal carcinomatosis with mild ascites. 3. Mild lymphadenopathy in porta hepatis and right cardiophrenic angle, suspicious for metastatic disease. 4. Moderate right and tiny left pleural effusions   12/05/2018 Tumor Marker   Patient's tumor was tested for the following markers: CA-125 Results of the tumor marker test revealed 1015.   12/06/2018 Cancer Staging   Staging form: Ovary, Fallopian Tube, and Primary Peritoneal Carcinoma, AJCC 8th Edition - Clinical: FIGO Stage IVA, calculated as  Stage IV (cT3c, cN1, cM1) - Signed by [name], MD on 12/06/2018    12/09/2018 Pathology Results   PLEURAL FLUID, RIGHT (SPECIMEN 1 OF 1 COLLECTED 12/09/18): - MALIGNANT CELLS CONSISTENT WITH METASTATIC ADENOCARCINOMA - SEE COMMENT Comment The neoplastic cells are positive for cytokeratin 7 and Pax-8 but negative for cytokeratin 20, TTF-1, CDX-2 and Gata-3. Overall, the phenotype is consistent with the clinical impression of gynecologic primary.    12/15/2018 Procedure   Placement of single lumen port a cath via right internal jugular vein. The catheter tip lies at the cavo-atrial junction. A power injectable port a cath was placed and is ready for immediate use   12/16/2018 - 08/24/2019 Chemotherapy   The patient had carboplatin and taxol for chemotherapy treatment.  She had 7 cycles given neoadjuvant prior to surgery and 3 more  cycles after surgery, for a total of 10 cycles of treatment    01/06/2019 Tumor Marker   Patient's tumor was tested for the following markers: CA-125 Results of the tumor marker test revealed 948   02/02/2019 Imaging   1. Massive pulmonary embolism, as discussed above. Given the mildly elevated RV to LV ratio of 0.95, this is associated with increased risk of morbidity and mortality. 2. Today's study demonstrates a mixed response to therapy. Specifically, while there has been regression of the bulky intraperitoneal metastatic disease and regression of previously noted pleural effusions, the large cystic mass in the central pelvis has increased in size compared to the prior study. 3. New right mild hydroureteronephrosis related to extrinsic compression on the distal third of the right ureter by the patient's large pelvic mass. 4. Scattered small pulmonary nodules (predominantly pleural based) appear stable compared to prior examinations. These are nonspecific but warrant continued attention on follow-up studies. 5. Aortic atherosclerosis, in addition to three-vessel coronary artery disease. Assessment for potential risk factor modification, dietary therapy or pharmacologic therapy may be warranted, if clinically indicated.   02/02/2019 - 02/04/2019 Hospital Admission   She was admitted the hospital due to DVT and PE   02/03/2019 Imaging   Bilateral venous Doppler US Right: Findings consistent with acute deep vein thrombosis involving the right femoral vein, right popliteal vein, right peroneal veins, right soleal veins, and right gastrocnemius veins. No cystic structure found in the popliteal fossa. Left: There is no evidence of deep vein thrombosis in the lower extremity. No cystic structure found in the popliteal fossa   02/17/2019 Tumor Marker   Patient's tumor was tested for the following markers: CA-125 Results of the tumor marker test revealed 127   03/10/2019 Tumor Marker   Patient's tumor was  tested for the following markers: CA-125. Results of the tumor marker test revealed 87.4   03/14/2019 - 03/16/2019 Hospital Admission   She was admitted to the hospital recently for weakness   03/31/2019 Tumor Marker   Patient's tumor was tested for the following markers: CA-125 Results of the tumor marker test revealed 59.2.   04/28/2019 Tumor Marker   Patient's tumor was tested for the following markers: CA-125 Results of the tumor marker test revealed 56.8   05/05/2019 Imaging   1. Interval decrease in size of the large cystic mass in the central pelvis. Mesenteric and omental soft tissue disease shows no substantial interval change. 2. The mild right hydroureteronephrosis seen previously has resolved in the interval. 3. Small residual nonobstructive thrombus identified in the inter lobar pulmonary artery common here into the lateral wall compatible with chronicity. 4. 14 mm subtle enhancing  lesion in the anterior right liver is stable. This may be vascular malformation. Attention on follow-up recommended. 5. Stable appearance of the multiple small bilateral pulmonary nodules. Continued attention on follow-up recommended. 6.  Aortic Atherosclerois (ICD10-170.0)     06/06/2019 Pathology Results   A. OMENTUM, RESECTION: - Metastatic carcinoma. B. RIGHT FALLOPIAN TUBE AND OVARY, SALPINGOOOPHORECTOMY: - High-grade serous carcinoma, spanning 9 cm. - No surface involvement identified. - Fallopian tube involved by carcinoma. - See oncology table. C. PERITONEAL NODULE, EXCISION: - Metastatic carcinoma. ONCOLOGY TABLE: OVARY or FALLOPIAN TUBE or PRIMARY PERITONEUM: Procedure: Right salpingo-oophorectomy, omental resection, and peritoneal biopsy. Specimen Integrity: Intact Tumor Site: Right ovary Ovarian Surface Involvement (required only if applicable): Not identified Tumor Size: 9 cm Histologic Type: High-grade serous carcinoma Histologic Grade: High-grade Implants (required for  advanced stage serous/seromucinous borderline tumors only): Omentum, peritoneum. Other Tissue/ Organ Involvement: Right fallopian tube Largest Extrapelvic Peritoneal Focus (required only if applicable): 7.3 cm Peritoneal/Ascitic Fluid: Positive pleural fluid pre neoadjuvant therapy. Treatment Effect (required only for high-grade serous carcinomas): Probable treatment effect present. Regional Lymph Nodes: No lymph nodes submitted or found Pathologic Stage Classification (pTNM, AJCC 8th Edition): ypT3c, ypNX Representative Tumor Block: B2   06/06/2019 Surgery   Preoperative Diagnosis: stage IV ovarian cancer, s/p neoadjuvant chemotherapy, history of recent PE.  Procedure(s) Performed: Exploratory laparotomy with right salpingo-oophorectomy, omentectomy radical tumor debulking for ovarian cancer .   Surgeon: Thereasa Solo, MD.    Operative Findings:  Omental cake adherent to anterior abdominal wall and hepatic flexure. 10cm right tube and ovary. Surgically absent uterus and left tube and ovary. Granular nodularity across right diaphragm.    This represented an optimal cytoreduction (R0) with no gross visible disease remaining.    07/06/2019 Tumor Marker   Patient's tumor was tested for the following markers:CA-125 Results of the tumor marker test revealed 16.4   08/10/2019 Genetic Testing   Negative genetic testing. No pathogenic variants identified. VUS in BRCA2 called [id number] identified on the [facility] CancerNext+RNAinsight panel. The report date is 08/10/2019.  The CancerNext+RNAinsight gene panel offered by [employer] includes sequencing and rearrangement analysis for the following 36 genes: APC*, ATM*, AXIN2, BARD1, BMPR1A, BRCA1*, BRCA2*, BRIP1*, CDH1*, CDK4, CDKN2A, CHEK2*, DICER1, MLH1*, MSH2*, MSH3, MSH6*, MUTYH*, NBN, NF1*, NTHL1, PALB2*, PMS2*, PTEN*, RAD51C*, RAD51D*, RECQL, SMAD4, SMARCA4, STK11 and TP53* (sequencing and deletion/duplication); HOXB13, POLD1 and POLE (sequencing  only); EPCAM and GREM1 (deletion/duplication only). DNA and RNA analyses performed for * genes.   HRD (somatic) testing was initially ordered and was not completed due to insufficient amount of tumor sample.    08/24/2019 Tumor Marker   Patient's tumor was tested for the following markers: CA-125 Results of the tumor marker test revealed 12.2   09/20/2019 Imaging   1. Interval debulking, bilateral salpingo oophorectomy and omentectomy with resection of the dominant pelvic cystic lesion and the bandlike soft tissue seen previously in the right mesentery/omentum. Today's study is status shows new postoperative baseline for follow-up. 2. 6 mm gastrohepatic ligament nodule seen on the previous study is stable. Continued attention on follow-up recommended. 3. No new or progressive findings in the chest, abdomen, or pelvis to suggest disease progression. 4. Stable 1.4 cm focus of homogeneous enhancement in the anterior right liver. Stable since at least 11/29/2018 and likely benign. Continued attention on follow-up recommended. 5. Aortic Atherosclerosis (ICD10-I70.0).   09/20/2019 Tumor Marker   Patient's tumor was tested for the following markers: CA-125 Results of the tumor marker test revealed 11.9  12/26/2019 Tumor Marker   Patient's tumor was tested for the following markers: CA-125 Results of the tumor marker test revealed 16.5   03/25/2020 Imaging   1. Interval progression of peritoneal disease which predominantly involves the serosal surface of the proximal and distal transverse colon, and descending colon. 2. New bilateral inguinal adenopathy. 3. No ascites. 4. Aortic atherosclerosis.   03/25/2020 Tumor Marker   Patient's tumor was tested for the following markers: CA-125 Results of the tumor marker test revealed 249   03/28/2020 Echocardiogram    1. Borderline LV Strain; A2C view represents the most accurate acquisition.. Left ventricular ejection fraction, by estimation, is 60 to  65%. Left ventricular ejection fraction by PLAX is 63 %. The left ventricle has normal function. The left ventricle demonstrates regional wall motion abnormalities (see scoring diagram/findings for description). Left ventricular diastolic parameters were normal.  2. Right ventricular systolic function is normal. The right ventricular size is normal.  3. Left atrial size was mildly dilated.  4. The mitral valve is normal in structure. No evidence of mitral valve regurgitation.  5. The aortic valve is grossly normal. Aortic valve regurgitation is mild.  6. The inferior vena cava is normal in size with greater than 50% respiratory variability, suggesting right atrial pressure of 3 mmHg.   04/02/2020 Tumor Marker   Patient's tumor was tested for the following markers: CA-125 Results of the tumor marker test revealed 304   04/02/2020 - 08/22/2020 Chemotherapy   The patient had carboplatin and Doxil for chemotherapy treatment.     05/27/2020 Tumor Marker   Patient's tumor was tested for the following markers: CA-125. Results of the tumor marker test revealed 42.7   06/10/2020 Imaging   Improving peritoneal disease and serosal implants along the left colon, as above.   Improving bilateral inguinal nodes.   06/18/2020 Echocardiogram    1. Left ventricular ejection fraction, by estimation, is 60 to 65%. The left ventricle has normal function. The left ventricle has no regional wall motion abnormalities. There is mild left ventricular hypertrophy of the basal-septal segment. Left ventricular diastolic parameters were normal. The average left ventricular global longitudinal strain is -25.0 %. The global longitudinal strain is normal.  2. Right ventricular systolic function is normal. The right ventricular size is normal.  3. The mitral valve is grossly normal. Mild mitral valve regurgitation.  4. Tricuspid valve regurgitation is moderate.  5. The aortic valve is tricuspid. There is mild calcification  of the aortic valve. There is mild thickening of the aortic valve. Aortic valve regurgitation is mild to moderate.  6. The inferior vena cava is normal in size with <50% respiratory variability, suggesting right atrial pressure of 8 mmHg.   06/25/2020 Tumor Marker   Patient's tumor was tested for the following markers: CA-125 Results of the tumor marker test revealed 23.9   07/15/2020 Imaging   IMPRESSION: 1. Mild amount of acute pulmonary embolism within multiple middle lobe and lower lobe branches of the right pulmonary artery. 2.   Small amount of chronic right pulmonary embolism. 3.   Mild posterior left basilar atelectasis and/or infiltrate. 4.   Small left pleural effusion. 5. Aortic atherosclerosis.   Aortic Atherosclerosis (ICD10-I70.0).     07/15/2020 - 07/17/2020 Hospital Admission   She was admitted to a local hospital after presentation with chest pain and shortness of breath and was found to have significant pulmonary embolism She was anticoagulated and discharged   07/16/2020 Echocardiogram   1. Left ventricular ejection fraction, by  estimation, is 60 to 65%. The left ventricle has normal function. The left ventricle has no regional wall motion abnormalities. There is mild left ventricular hypertrophy.  Left ventricular diastolic parameters are indeterminate.   2. Right ventricular systolic function is normal. The right ventricular size is normal. There is normal pulmonary artery systolic pressure. The estimated right ventricular systolic pressure is 62.3 mmHg.   3. The mitral valve is grossly normal. Trivial mitral valve regurgitation.   4. The aortic valve is tricuspid. Aortic valve regurgitation is mild.   5. The inferior vena cava is normal in size with greater than 50% respiratory variability, suggesting right atrial pressure of 3 mmHg.    07/25/2020 Tumor Marker   Patient's tumor was tested for the following markers: CA-125 Results of the tumor marker test revealed  36.4   09/09/2020 Imaging   1. Mild improvement in peritoneal and serosal metastasis along the splenic flexure and descending colon. 2. Inguinal lymph nodes are slightly increased in size. Lymph nodes increased in size from 06/10/2020 and similar to CT of 03/25/2020. 3. No new peritoneal disease.  No free fluid   09/21/2020 - 10/21/2020 Chemotherapy   She started taking Niraparib, discontinued due to intolerable side effects from headache and hypertension       10/01/2020 Tumor Marker   Patient's tumor was tested for the following markers: CA-125 Results of the tumor marker test revealed 83.4.   10/16/2020 Imaging   No acute findings in the abdomen or pelvis.   Hepatic steatosis.   Large stool burden throughout the colon.       12/03/2020 Tumor Marker   Patient's tumor was tested for the following markers: CA-125 Results of the tumor marker test revealed 146   12/20/2020 Imaging   1. Signs of peritoneal carcinomatosis. No significant interval change from previous exam. 2. No findings of solid organ metastasis or nodal metastasis within the abdomen or pelvis. Enlarged right inguinal node is mildly increased in size. Stable enlarged left inguinal node. 3. No evidence for metastatic disease to the chest. 4. Aortic atherosclerosis. Coronary artery calcifications.   12/27/2020 - 03/07/2021 Chemotherapy    Patient is on PACLITAXEL       02/07/2021 Tumor Marker   Patient's tumor was tested for the following markers: CA-125. Results of the tumor marker test revealed 395.   02/28/2021 Tumor Marker   Patient's tumor was tested for the following markers: CA-125. Results of the tumor marker test revealed 548.   03/20/2021 Imaging   1. Interval enlargement of multiple peritoneal soft tissue masses and nodules. 2. Interval enlargement of portacaval lymph nodes as well as slight interval enlargement of bilateral inguinal lymph nodes. 3. Findings are consistent with worsened nodal and peritoneal  metastatic disease. 4. Status post hysterectomy.   Aortic Atherosclerosis (ICD10-I70.0).   03/27/2021 Echocardiogram   1. Left ventricular ejection fraction, by estimation, is 60 to 65%. The left ventricle has normal function. The left ventricle has no regional wall motion abnormalities. There is mild asymmetric left ventricular hypertrophy of the basal-septal segment. Left ventricular diastolic parameters were normal.  2. Right ventricular systolic function is normal. The right ventricular size is normal.  3. The mitral valve is normal in structure. Trivial mitral valve regurgitation. No evidence of mitral stenosis.  4. The aortic valve is tricuspid. Aortic valve regurgitation is moderate. No aortic stenosis is present.  5. The inferior vena cava is normal in size with greater than 50% respiratory variability, suggesting right atrial pressure of 3 mmHg.  03/28/2021 -  Chemotherapy   Patient is on Treatment Plan : OVARIAN RECURRENT Liposomal Doxorubicin + Carboplatin q28d X 6 Cycles     03/28/2021 Tumor Marker   Patient's tumor was tested for the following markers: CA-125. Results of the tumor marker test revealed 991.   04/28/2021 Tumor Marker   Patient's tumor was tested for the following markers: CA-125. Results of the tumor marker test revealed 992.   06/16/2021 Imaging   Improving abdominopelvic lymphadenopathy, including a dominant 16 mm short axis right inguinal node, as above.   Improving peritoneal carcinomatosis, including a dominant 1.2 x 4.6 cm implant beneath the right mid abdominal wall.   06/25/2021 - 07/01/2021 Hospital Admission   She was hospitalized for subacute bowel obstruction, managed conservatively   06/30/2021 Echocardiogram   1. Left ventricular ejection fraction, by estimation, is 60 to 65%. The left ventricle has normal function. The left ventricle has no regional wall motion abnormalities. There is mild left ventricular hypertrophy. Left ventricular diastolic  parameters are consistent with Grade I diastolic dysfunction (impaired relaxation). The average left ventricular global longitudinal strain is -18.2 %. The global longitudinal strain is normal.   2. Right ventricular systolic function is normal. The right ventricular size is normal. Tricuspid regurgitation signal is inadequate for assessing PA pressure.   3. The mitral valve is normal in structure. Trivial mitral valve regurgitation. No evidence of mitral stenosis.   4. The aortic valve is tricuspid. Aortic valve regurgitation is mild to moderate. No aortic stenosis is present.      PHYSICAL EXAMINATION: ECOG PERFORMANCE STATUS: 1 - Symptomatic but completely ambulatory  Vitals:   08/19/21 1101  BP: (!) 122/51  Pulse: 84  Resp: 18  Temp: 98 F (36.7 C)  SpO2: 98%   Filed Weights   08/19/21 1101  Weight: 217 lb 12.8 oz (98.8 kg)    GENERAL:alert, no distress and comfortable SKIN: skin color, texture, turgor are normal, no rashes or significant lesions EYES: normal, Conjunctiva are pink and non-injected, sclera clear OROPHARYNX:no exudate, no erythema and lips, buccal mucosa, and tongue normal  NECK: supple, thyroid normal size, non-tender, without nodularity LYMPH:  no palpable lymphadenopathy in the cervical, axillary or inguinal LUNGS: clear to auscultation and percussion with normal breathing effort HEART: regular rate & rhythm and no murmurs and no lower extremity edema ABDOMEN:abdomen soft, non-tender and normal bowel sounds Musculoskeletal:no cyanosis of digits and no clubbing  NEURO: alert & oriented x 3 with fluent speech, no focal motor/sensory deficits  LABORATORY DATA:  I have reviewed the data as listed    Component Value Date/Time   NA 136 08/19/2021 1042   K 4.1 08/19/2021 1042   CL 100 08/19/2021 1042   CO2 29 08/19/2021 1042   GLUCOSE 110 (H) 08/19/2021 1042   BUN 20 08/19/2021 1042   CREATININE 0.96 08/19/2021 1042   CALCIUM 9.8 08/19/2021 1042   PROT  7.1 08/19/2021 1042   ALBUMIN 4.0 08/19/2021 1042   AST 16 08/19/2021 1042   ALT 13 08/19/2021 1042   ALKPHOS 92 08/19/2021 1042   BILITOT 0.5 08/19/2021 1042   GFRNONAA >60 08/19/2021 1042   GFRAA >60 03/25/2020 1144    No results found for: SPEP, UPEP  Lab Results  Component Value Date   WBC 3.3 (L) 08/19/2021   NEUTROABS 1.4 (L) 08/19/2021   HGB 8.7 (L) 08/19/2021   HCT 24.6 (L) 08/19/2021   MCV 112.3 (H) 08/19/2021   PLT 85 (L) 08/19/2021  Chemistry      Component Value Date/Time   NA 136 08/19/2021 1042   K 4.1 08/19/2021 1042   CL 100 08/19/2021 1042   CO2 29 08/19/2021 1042   BUN 20 08/19/2021 1042   CREATININE 0.96 08/19/2021 1042      Component Value Date/Time   CALCIUM 9.8 08/19/2021 1042   ALKPHOS 92 08/19/2021 1042   AST 16 08/19/2021 1042   ALT 13 08/19/2021 1042   BILITOT 0.5 08/19/2021 1042

## 2021-08-19 NOTE — Assessment & Plan Note (Addendum)
Multifactorial, likely due to side effects of treatment We will proceed without delay I plan dose reduction I will check a vitamin B12 level She does not need transfusion support

## 2021-08-19 NOTE — Assessment & Plan Note (Signed)
She had several episodes of vaginal bleeding that has since resolved She will continue anticoagulation therapy indefinitely There is no contraindication to remain on antiplatelet agents or anticoagulants as long as the platelet is greater than 50,000.

## 2021-08-19 NOTE — Assessment & Plan Note (Signed)
She has joint pain and inflammatory arthritis I recommend low-dose prednisone to try

## 2021-08-19 NOTE — Patient Instructions (Signed)
[facility]  Discharge Instructions: Thank you for choosing [facility] to provide your oncology and hematology care.   If you have a lab appointment with the [facility], please go directly to the [facility] and check in at the registration area.   Wear comfortable clothing and clothing appropriate for easy access to any Portacath or PICC line.   We strive to give you quality time with your provider. You may need to reschedule your appointment if you arrive late (15 or more minutes).  Arriving late affects you and other patients whose appointments are after yours.  Also, if you miss three or more appointments without notifying the office, you may be dismissed from the clinic at the providers discretion.      For prescription refill requests, have your pharmacy contact our office and allow 72 hours for refills to be completed.    Today you received the following chemotherapy and/or immunotherapy agents: carboplatin, doxorubicin      To help prevent nausea and vomiting after your treatment, we encourage you to take your nausea medication as directed.  BELOW ARE SYMPTOMS THAT SHOULD BE REPORTED IMMEDIATELY: *FEVER GREATER THAN 100.4 F (38 C) OR HIGHER *CHILLS OR SWEATING *NAUSEA AND VOMITING THAT IS NOT CONTROLLED WITH YOUR NAUSEA MEDICATION *UNUSUAL SHORTNESS OF BREATH *UNUSUAL BRUISING OR BLEEDING *URINARY PROBLEMS (pain or burning when urinating, or frequent urination) *BOWEL PROBLEMS (unusual diarrhea, constipation, pain near the anus) TENDERNESS IN MOUTH AND THROAT WITH OR WITHOUT PRESENCE OF ULCERS (sore throat, sores in mouth, or a toothache) UNUSUAL RASH, SWELLING OR PAIN  UNUSUAL VAGINAL DISCHARGE OR ITCHING   Items with * indicate a potential emergency and should be followed up as soon as possible or go to the Emergency Department if any problems should occur.  Please show the CHEMOTHERAPY ALERT CARD or IMMUNOTHERAPY ALERT CARD  at check-in to the Emergency Department and triage nurse.  Should you have questions after your visit or need to cancel or reschedule your appointment, please contact [facility]  Dept: [phone]  and follow the prompts.  Office hours are 8:00 a.m. to 4:30 p.m. Monday - Friday. Please note that voicemails left after 4:00 p.m. may not be returned until the following business day.  We are closed weekends and major holidays. You have access to a nurse at all times for urgent questions. Please call the main number to the clinic Dept: [phone] and follow the prompts.   For any non-urgent questions, you may also contact your provider using MyChart. We now offer e-Visits for anyone 77 and older to request care online for non-urgent symptoms. For details visit [email or web address].   Also download the MyChart app! Go to the app store, search "MyChart", open the app, select [facility], and log in with your MyChart username and password.  Due to Covid, a mask is required upon entering the hospital/clinic. If you do not have a mask, one will be given to you upon arrival. For doctor visits, patients may have 1 support person aged 47 or older with them. For treatment visits, patients cannot have anyone with them due to current Covid guidelines and our immunocompromised population.

## 2021-08-19 NOTE — Assessment & Plan Note (Signed)
She tolerated treatment poorly with worsening pancytopenia We will proceed with treatment with further dose adjustment I will also check a B12 level I plan to delay her next treatment by 1 week and repeat CT imaging next month for further follow-up

## 2021-09-02 DIAGNOSIS — H26492 Other secondary cataract, left eye: Secondary | ICD-10-CM | POA: Diagnosis not present

## 2021-09-02 DIAGNOSIS — H43813 Vitreous degeneration, bilateral: Secondary | ICD-10-CM | POA: Diagnosis not present

## 2021-09-02 DIAGNOSIS — H524 Presbyopia: Secondary | ICD-10-CM | POA: Diagnosis not present

## 2021-09-02 DIAGNOSIS — Z961 Presence of intraocular lens: Secondary | ICD-10-CM | POA: Diagnosis not present

## 2021-09-03 DIAGNOSIS — M545 Low back pain, unspecified: Secondary | ICD-10-CM | POA: Diagnosis not present

## 2021-09-11 ENCOUNTER — Inpatient Hospital Stay: Payer: Medicare Other | Attending: Gynecologic Oncology | Admitting: Hematology and Oncology

## 2021-09-11 ENCOUNTER — Other Ambulatory Visit: Payer: Self-pay

## 2021-09-11 ENCOUNTER — Telehealth: Payer: Self-pay

## 2021-09-11 ENCOUNTER — Encounter: Payer: Self-pay | Admitting: Hematology and Oncology

## 2021-09-11 ENCOUNTER — Inpatient Hospital Stay: Payer: Medicare Other

## 2021-09-11 VITALS — BP 132/53 | HR 77 | Temp 98.3°F | Resp 18 | Ht 67.0 in | Wt 217.2 lb

## 2021-09-11 DIAGNOSIS — C561 Malignant neoplasm of right ovary: Secondary | ICD-10-CM | POA: Insufficient documentation

## 2021-09-11 DIAGNOSIS — D61818 Other pancytopenia: Secondary | ICD-10-CM | POA: Insufficient documentation

## 2021-09-11 DIAGNOSIS — I1310 Hypertensive heart and chronic kidney disease without heart failure, with stage 1 through stage 4 chronic kidney disease, or unspecified chronic kidney disease: Secondary | ICD-10-CM | POA: Diagnosis not present

## 2021-09-11 DIAGNOSIS — D539 Nutritional anemia, unspecified: Secondary | ICD-10-CM

## 2021-09-11 DIAGNOSIS — Z9221 Personal history of antineoplastic chemotherapy: Secondary | ICD-10-CM | POA: Diagnosis not present

## 2021-09-11 DIAGNOSIS — Z7901 Long term (current) use of anticoagulants: Secondary | ICD-10-CM | POA: Diagnosis not present

## 2021-09-11 DIAGNOSIS — N183 Chronic kidney disease, stage 3 unspecified: Secondary | ICD-10-CM | POA: Diagnosis not present

## 2021-09-11 DIAGNOSIS — L089 Local infection of the skin and subcutaneous tissue, unspecified: Secondary | ICD-10-CM

## 2021-09-11 DIAGNOSIS — Z79899 Other long term (current) drug therapy: Secondary | ICD-10-CM | POA: Insufficient documentation

## 2021-09-11 DIAGNOSIS — Z5111 Encounter for antineoplastic chemotherapy: Secondary | ICD-10-CM | POA: Diagnosis not present

## 2021-09-11 DIAGNOSIS — I2699 Other pulmonary embolism without acute cor pulmonale: Secondary | ICD-10-CM | POA: Diagnosis not present

## 2021-09-11 DIAGNOSIS — Z90721 Acquired absence of ovaries, unilateral: Secondary | ICD-10-CM | POA: Diagnosis not present

## 2021-09-11 DIAGNOSIS — C786 Secondary malignant neoplasm of retroperitoneum and peritoneum: Secondary | ICD-10-CM | POA: Diagnosis not present

## 2021-09-11 LAB — CBC WITH DIFFERENTIAL (CANCER CENTER ONLY)
Abs Immature Granulocytes: 0.01 10*3/uL (ref 0.00–0.07)
Basophils Absolute: 0 10*3/uL (ref 0.0–0.1)
Basophils Relative: 0 %
Eosinophils Absolute: 0 10*3/uL (ref 0.0–0.5)
Eosinophils Relative: 0 %
HCT: 28.6 % — ABNORMAL LOW (ref 36.0–46.0)
Hemoglobin: 9.6 g/dL — ABNORMAL LOW (ref 12.0–15.0)
Immature Granulocytes: 0 %
Lymphocytes Relative: 34 %
Lymphs Abs: 1.2 10*3/uL (ref 0.7–4.0)
MCH: 39.2 pg — ABNORMAL HIGH (ref 26.0–34.0)
MCHC: 33.6 g/dL (ref 30.0–36.0)
MCV: 116.7 fL — ABNORMAL HIGH (ref 80.0–100.0)
Monocytes Absolute: 0.5 10*3/uL (ref 0.1–1.0)
Monocytes Relative: 15 %
Neutro Abs: 1.7 10*3/uL (ref 1.7–7.7)
Neutrophils Relative %: 51 %
Platelet Count: 63 10*3/uL — ABNORMAL LOW (ref 150–400)
RBC: 2.45 MIL/uL — ABNORMAL LOW (ref 3.87–5.11)
RDW: 16.1 % — ABNORMAL HIGH (ref 11.5–15.5)
WBC Count: 3.4 10*3/uL — ABNORMAL LOW (ref 4.0–10.5)
nRBC: 1.2 % — ABNORMAL HIGH (ref 0.0–0.2)

## 2021-09-11 LAB — CMP (CANCER CENTER ONLY)
ALT: 27 U/L (ref 0–44)
AST: 15 U/L (ref 15–41)
Albumin: 4.1 g/dL (ref 3.5–5.0)
Alkaline Phosphatase: 75 U/L (ref 38–126)
Anion gap: 8 (ref 5–15)
BUN: 20 mg/dL (ref 8–23)
CO2: 28 mmol/L (ref 22–32)
Calcium: 10.1 mg/dL (ref 8.9–10.3)
Chloride: 99 mmol/L (ref 98–111)
Creatinine: 1.09 mg/dL — ABNORMAL HIGH (ref 0.44–1.00)
GFR, Estimated: 52 mL/min — ABNORMAL LOW
Glucose, Bld: 117 mg/dL — ABNORMAL HIGH (ref 70–99)
Potassium: 4.4 mmol/L (ref 3.5–5.1)
Sodium: 135 mmol/L (ref 135–145)
Total Bilirubin: 0.4 mg/dL (ref 0.3–1.2)
Total Protein: 7.2 g/dL (ref 6.5–8.1)

## 2021-09-11 LAB — SAMPLE TO BLOOD BANK

## 2021-09-11 MED ORDER — CEPHALEXIN 500 MG PO CAPS: 500.0000 mg | ORAL_CAPSULE | Freq: Three times a day (TID) | ORAL | 0 refills | Status: DC

## 2021-09-11 NOTE — Telephone Encounter (Signed)
Returned her call. She is complaining of soreness to a boil near her rectum. The area is dark red and size of the end of her finger. She has had the area x 3 days. She has been applying some type of cream. Denies fever. She feels like just had chemo. C/o tiredness and back pain. She went to the urgent care on 3/8 for back pain. Denies constipation. She is complaining of more vaginal spotting than she normally has. ? ?She is asking for appt with Dr. Alvy Bimler or should see PCP.  ?

## 2021-09-11 NOTE — Assessment & Plan Note (Signed)
Her examination reveals evidence of skin infection on the buttock cheek ?Given her recent chemotherapy and mild pancytopenia, I recommend we proceed with antibiotic treatment ?I will reassess her skin in her next visit ?She will proceed with imaging study and blood work at the end of the month ?

## 2021-09-11 NOTE — Assessment & Plan Note (Signed)
The patient is prone to get recurrent infection ?We will continue with the plan with a course of antibiotics ?

## 2021-09-11 NOTE — Telephone Encounter (Signed)
Called and given appt at 1 pm and told to arrive early. She verbalized understanding. ?

## 2021-09-11 NOTE — Telephone Encounter (Signed)
I can see her at 1 pm, tell her to come in early if possible ?

## 2021-09-11 NOTE — Progress Notes (Signed)
[facility] ?OFFICE PROGRESS NOTE ? ?Patient Care Team: ?Caryl Bis, MD as PCP - General (Family Medicine) ?Awanda Mink Craige Cotta, RN as Oncology Nurse Navigator (Oncology) ? 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 ?Pancytopenia, acquired ([place]) ?She has pancytopenia due to recent treatment ?The patient is given a copy of her CBC ?She does not need blood transfusion support today ?There is no contraindication to remain on antiplatelet agents or anticoagulants as long as the platelet is greater than 50,000. ? ? ? ?Skin infection ?The patient is prone to get recurrent infection ?We will continue with the plan with a course of antibiotics ? ?Orders Placed This Encounter  ?Procedures  ? Sample to Blood Bank  ?  Standing Status:   Standing  ?  Number of Occurrences:   44  ?  Standing Expiration Date:   09/12/2022  ? ? ?All questions were answered. The patient knows to call the clinic with any problems, questions or concerns. ?The total time spent in the appointment was 30 minutes encounter with patients including review of chart and various tests results, discussions about plan of care and coordination of care plan ?  ?[name], MD ?09/11/2021 1:41 PM ? ?INTERVAL HISTORY: ?Please see below for problem oriented charting. ?she returns for urgent evaluation ?She has concerned due to excessive fatigue and mild intermittent bleeding ?She has noted a developing skin boil on her buttock cheek near the rectum area ?She has been putting some cream over it ?Denies fever or chills ?No chest pain or shortness of breath ? ?REVIEW OF SYSTEMS:   ?Constitutional: Denies fevers, chills or abnormal weight loss ?Eyes:  Denies blurriness of vision ?Ears, nose, mouth, throat, and face: Denies mucositis or sore throat ?Respiratory: Denies cough, dyspnea or wheezes ?Cardiovascular: Denies palpitation, chest discomfort or lower extremity swelling ?Gastrointestinal:  Denies nausea, heartburn or change in bowel habits ?Lymphatics: Denies new lymphadenopathy or easy bruising ?Neurological:Denies numbness, tingling or new weaknesses ?Behavioral/Psych: Mood is stable, no new changes  ?All other systems were reviewed with the patient and are negative. ? ?I have reviewed the past medical history, past surgical history, social history and family history with the patient and they are unchanged from previous note. ? ?ALLERGIES:  has No Known Allergies. ? ?MEDICATIONS:  ?Current Outpatient Medications  ?Medication Sig Dispense Refill  ? cephALEXin (KEFLEX) 500 MG capsule Take 1 capsule (500 mg total) by mouth 3 (three) times daily. 21 capsule 0  ? acetaminophen (TYLENOL) 325 MG tablet Take 650 mg by mouth every 6 (six) hours as needed for moderate pain or headache.     ? atenolol (TENORMIN) 25 MG tablet TAKE ONE (1) TABLET BY MOUTH EVERY DAY 30 tablet 3  ? Docusate Sodium 100 MG capsule Take 100 mg by mouth 2 (two) times daily as needed for constipation.    ? ELIQUIS 5 MG TABS tablet TAKE ONE TABLET BY MOUTH TWICE DAILY (Patient taking differently: Take 5 mg by mouth 2 (two) times daily.) 60 tablet 5  ? fexofenadine (ALLEGRA) 180 MG tablet Take 180 mg by mouth daily as needed for allergies or rhinitis.    ? levothyroxine (SYNTHROID) 75 MCG tablet Take 1 tablet (75  mcg total) by mouth daily before breakfast. 30 tablet 3  ? lidocaine-prilocaine (EMLA) cream Apply 1 application topically daily as needed (port access). 30 g 3  ? Melatonin 3 MG TABS Take 3 mg by mouth at bedtime as needed (sleep).    ? ondansetron (ZOFRAN) 8 MG tablet Take 1 tablet (8 mg total) by mouth 2 (two) times daily as needed (Nausea or vomiting). 30 tablet 1  ? polyethylene  glycol (MIRALAX / GLYCOLAX) 17 g packet Take 17 g by mouth daily. 14 each 0  ? predniSONE (DELTASONE) 5 MG tablet Take 1 tablet (5 mg total) by mouth daily with breakfast. 30 tablet 0  ? prochlorperazine (COMPAZINE) 10 MG tablet Take 1 tablet (10 mg total) by mouth every 6 (six) hours as needed (Nausea or vomiting). 30 tablet 1  ? senna-docusate (SENOKOT-S) 8.6-50 MG tablet Take 2 tablets by mouth at bedtime. (Patient taking differently: Take 2 tablets by mouth at bedtime as needed for mild constipation.) 60 tablet 3  ? ?No current facility-administered medications for this visit.  ? ?Facility-Administered Medications Ordered in Other Visits  ?Medication Dose Route Frequency Provider Last Rate Last Admin  ? sodium chloride flush (NS) 0.9 % injection 10 mL  10 mL Intracatheter PRN [name], MD   10 mL at 08/19/21 1506  ? ? ?SUMMARY OF ONCOLOGIC HISTORY: ?Oncology History Overview Note  ?High grade serous ?Neg genetics ?Intolerance to niraparib ?Progressed on Taxol ?  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 Her symptoms began in April/May, 2020.  ?She has bloating and early satiety. Shortness of breath with walking. She denied bleeding. She reported constipation with pain with defecation and narrowed stools ?  ?11/29/2018 Imaging  ? 1. 13 cm complex cystic lesion in the central pelvis, highly suspicious for ovarian cystadenocarcinoma. ?2. Diffuse peritoneal carcinomatosis with mild ascites. ?3. Mild lymphadenopathy in porta hepatis and right cardiophrenic angle, suspicious for metastatic disease. ?4. Moderate right and tiny left pleural effusions ?  ?12/05/2018 Tumor Marker  ? Patient's tumor was tested for the following markers: CA-125 ?Results of the tumor marker test revealed 1015. ?  ?12/06/2018 Cancer Staging  ? Staging form: Ovary, Fallopian Tube, and Primary Peritoneal Carcinoma, AJCC 8th Edition ?- Clinical: FIGO Stage IVA, calculated as Stage IV (cT3c, cN1, cM1) - Signed by  [name], MD on 12/06/2018 ? ?  ?12/09/2018 Pathology Results  ? PLEURAL FLUID, RIGHT (SPECIMEN 1 OF 1 COLLECTED 12/09/18): ?- MALIGNANT CELLS CONSISTENT WITH METASTATIC ADENOCARCINOMA ?- SEE COMMENT ?Comment ?The neoplastic cells are positive for cytokeratin 7 and Pax-8 but negative for cytokeratin 20, TTF-1, CDX-2 and Gata-3. Overall, the phenotype is consistent with the clinical impression of gynecologic primary.  ?  ?12/15/2018 Procedure  ? Placement of single lumen port a cath via right internal jugular vein. The catheter tip lies at the cavo-atrial junction. A power injectable port a cath was placed and is ready for immediate use ?  ?12/16/2018 - 08/24/2019 Chemotherapy  ? The patient had carboplatin and taxol for chemotherapy treatment.  She had 7 cycles given neoadjuvant prior to surgery and 3 more cycles after surgery, for a total of 10 cycles of treatment ? ?  ?01/06/2019 Tumor Marker  ? Patient's tumor was tested for the following markers: CA-125 ?Results of the tumor marker test revealed 948 ?  ?02/02/2019 Imaging  ? 1. Massive pulmonary embolism, as discussed above. Given the mildly elevated RV to LV ratio of 0.95, this is associated with increased  risk of morbidity and mortality. ?2. Today's study demonstrates a mixed response to therapy. Specifically, while there has been regression of the bulky intraperitoneal metastatic disease and regression of previously noted pleural effusions, the large cystic mass in the central pelvis has increased in size compared to the prior study. ?3. New right mild hydroureteronephrosis related to extrinsic compression on the distal third of the right ureter by the patient's large pelvic mass. ?4. Scattered small pulmonary nodules (predominantly pleural based) appear stable compared to prior examinations. These are nonspecific but warrant continued attention on follow-up studies. ?5. Aortic atherosclerosis, in addition to three-vessel coronary artery disease. Assessment for  potential risk factor modification, dietary therapy or pharmacologic therapy may be warranted, if clinically indicated. ?  ?02/02/2019 - 02/04/2019 Hospital Admission  ? She was admitted the hospital due to DVT and

## 2021-09-11 NOTE — Assessment & Plan Note (Signed)
She has pancytopenia due to recent treatment ?The patient is given a copy of her CBC ?She does not need blood transfusion support today ?There is no contraindication to remain on antiplatelet agents or anticoagulants as long as the platelet is greater than 50,000. ? ? ?

## 2021-09-15 ENCOUNTER — Telehealth: Payer: Self-pay

## 2021-09-15 NOTE — Telephone Encounter (Signed)
Called and given below message. She verbalized understanding. She is feeling better. The boil area is better. She is able to walk and move around more. She will see ortho on Thursday. She appreciated the call. ?

## 2021-09-15 NOTE — Telephone Encounter (Signed)
-----   Message from [name], MD sent at 09/15/2021  8:37 AM EDT ----- ?Can youc all and ask if she is better with antibiotics? ? ?

## 2021-09-18 DIAGNOSIS — M25551 Pain in right hip: Secondary | ICD-10-CM | POA: Diagnosis not present

## 2021-09-18 DIAGNOSIS — M7061 Trochanteric bursitis, right hip: Secondary | ICD-10-CM | POA: Diagnosis not present

## 2021-09-23 ENCOUNTER — Inpatient Hospital Stay: Payer: Medicare Other

## 2021-09-23 ENCOUNTER — Other Ambulatory Visit: Payer: Self-pay

## 2021-09-23 ENCOUNTER — Encounter (HOSPITAL_COMMUNITY): Payer: Self-pay

## 2021-09-23 ENCOUNTER — Ambulatory Visit (HOSPITAL_COMMUNITY)
Admission: RE | Admit: 2021-09-23 | Discharge: 2021-09-23 | Disposition: A | Discharge status: Home / Self Care | Payer: Medicare Other | Source: Ambulatory Visit | Attending: Hematology and Oncology | Admitting: Hematology and Oncology

## 2021-09-23 DIAGNOSIS — D539 Nutritional anemia, unspecified: Secondary | ICD-10-CM

## 2021-09-23 DIAGNOSIS — N183 Chronic kidney disease, stage 3 unspecified: Secondary | ICD-10-CM | POA: Diagnosis not present

## 2021-09-23 DIAGNOSIS — C561 Malignant neoplasm of right ovary: Secondary | ICD-10-CM | POA: Insufficient documentation

## 2021-09-23 DIAGNOSIS — Z7189 Other specified counseling: Secondary | ICD-10-CM

## 2021-09-23 DIAGNOSIS — M1990 Unspecified osteoarthritis, unspecified site: Secondary | ICD-10-CM | POA: Insufficient documentation

## 2021-09-23 DIAGNOSIS — Z9221 Personal history of antineoplastic chemotherapy: Secondary | ICD-10-CM | POA: Diagnosis not present

## 2021-09-23 DIAGNOSIS — I1310 Hypertensive heart and chronic kidney disease without heart failure, with stage 1 through stage 4 chronic kidney disease, or unspecified chronic kidney disease: Secondary | ICD-10-CM | POA: Diagnosis not present

## 2021-09-23 DIAGNOSIS — C786 Secondary malignant neoplasm of retroperitoneum and peritoneum: Secondary | ICD-10-CM | POA: Diagnosis not present

## 2021-09-23 DIAGNOSIS — Z5111 Encounter for antineoplastic chemotherapy: Secondary | ICD-10-CM | POA: Diagnosis not present

## 2021-09-23 DIAGNOSIS — Z7901 Long term (current) use of anticoagulants: Secondary | ICD-10-CM | POA: Diagnosis not present

## 2021-09-23 DIAGNOSIS — I2699 Other pulmonary embolism without acute cor pulmonale: Secondary | ICD-10-CM | POA: Diagnosis not present

## 2021-09-23 DIAGNOSIS — K7689 Other specified diseases of liver: Secondary | ICD-10-CM | POA: Diagnosis not present

## 2021-09-23 DIAGNOSIS — Z79899 Other long term (current) drug therapy: Secondary | ICD-10-CM | POA: Diagnosis not present

## 2021-09-23 DIAGNOSIS — D61818 Other pancytopenia: Secondary | ICD-10-CM | POA: Diagnosis not present

## 2021-09-23 LAB — CMP (CANCER CENTER ONLY)
ALT: 29 U/L (ref 0–44)
AST: 17 U/L (ref 15–41)
Albumin: 3.9 g/dL (ref 3.5–5.0)
Alkaline Phosphatase: 111 U/L (ref 38–126)
Anion gap: 7 (ref 5–15)
BUN: 21 mg/dL (ref 8–23)
CO2: 29 mmol/L (ref 22–32)
Calcium: 10.3 mg/dL (ref 8.9–10.3)
Chloride: 102 mmol/L (ref 98–111)
Creatinine: 1.06 mg/dL — ABNORMAL HIGH (ref 0.44–1.00)
GFR, Estimated: 54 mL/min — ABNORMAL LOW (ref >60)
Glucose, Bld: 115 mg/dL — ABNORMAL HIGH (ref 70–99)
Potassium: 4.8 mmol/L (ref 3.5–5.1)
Sodium: 138 mmol/L (ref 135–145)
Total Bilirubin: 0.4 mg/dL (ref 0.3–1.2)
Total Protein: 7.3 g/dL (ref 6.5–8.1)

## 2021-09-23 LAB — CBC WITH DIFFERENTIAL (CANCER CENTER ONLY)
Abs Immature Granulocytes: 0.01 10*3/uL (ref 0.00–0.07)
Basophils Absolute: 0 10*3/uL (ref 0.0–0.1)
Basophils Relative: 0 %
Eosinophils Absolute: 0 10*3/uL (ref 0.0–0.5)
Eosinophils Relative: 1 %
HCT: 26.5 % — ABNORMAL LOW (ref 36.0–46.0)
Hemoglobin: 8.7 g/dL — ABNORMAL LOW (ref 12.0–15.0)
Immature Granulocytes: 0 %
Lymphocytes Relative: 47 %
Lymphs Abs: 1.4 10*3/uL (ref 0.7–4.0)
MCH: 38.5 pg — ABNORMAL HIGH (ref 26.0–34.0)
MCHC: 32.8 g/dL (ref 30.0–36.0)
MCV: 117.3 fL — ABNORMAL HIGH (ref 80.0–100.0)
Monocytes Absolute: 0.4 10*3/uL (ref 0.1–1.0)
Monocytes Relative: 14 %
Neutro Abs: 1.2 10*3/uL — ABNORMAL LOW (ref 1.7–7.7)
Neutrophils Relative %: 38 %
Platelet Count: 106 10*3/uL — ABNORMAL LOW (ref 150–400)
RBC: 2.26 MIL/uL — ABNORMAL LOW (ref 3.87–5.11)
RDW: 15.1 % (ref 11.5–15.5)
WBC Count: 3 10*3/uL — ABNORMAL LOW (ref 4.0–10.5)
nRBC: 1 % — ABNORMAL HIGH (ref 0.0–0.2)

## 2021-09-23 LAB — SAMPLE TO BLOOD BANK

## 2021-09-23 MED ORDER — IOHEXOL 300 MG/ML  SOLN
100.0000 mL | Freq: Once | INTRAMUSCULAR | Status: AC | PRN
  Administered 2021-09-23: 100 mL via INTRAVENOUS

## 2021-09-23 MED ORDER — HEPARIN SOD (PORK) LOCK FLUSH 100 UNIT/ML IV SOLN: 500.0000 [IU] | Freq: Once | INTRAVENOUS | Status: DC

## 2021-09-23 MED ORDER — ALTEPLASE 2 MG IJ SOLR
2.0000 mg | Freq: Once | INTRAMUSCULAR | Status: AC
  Administered 2021-09-23: 2 mg
  Filled 2021-09-23: qty 2

## 2021-09-23 MED ORDER — HEPARIN SOD (PORK) LOCK FLUSH 100 UNIT/ML IV SOLN
INTRAVENOUS | Status: AC
  Administered 2021-09-23: 500 [IU]
  Filled 2021-09-23: qty 5

## 2021-09-23 MED ORDER — SODIUM CHLORIDE 0.9% FLUSH
10.0000 mL | Freq: Once | INTRAVENOUS | Status: AC
  Administered 2021-09-23: 10 mL

## 2021-09-23 MED ORDER — SODIUM CHLORIDE (PF) 0.9 % IJ SOLN
INTRAMUSCULAR | Status: AC
  Filled 2021-09-23: qty 50

## 2021-09-24 ENCOUNTER — Ambulatory Visit: Payer: Medicare Other | Attending: Physician Assistant

## 2021-09-24 ENCOUNTER — Other Ambulatory Visit: Payer: Self-pay

## 2021-09-24 DIAGNOSIS — M25551 Pain in right hip: Secondary | ICD-10-CM | POA: Diagnosis not present

## 2021-09-24 NOTE — Therapy (Signed)
[facility] ?[address] ?[place], [place], [address] ?Phone: [phone]   Fax:  [phone] ? ?Physical Therapy Evaluation ? ?Patient Details  ?Name: Paula Peterson ?MRN: [id number] ?Date of Birth: 22-Jan-1941 ?Referring Provider (PT): [place], [place] ? ? ?Encounter Date: 09/24/2021 ? ? PT End of Session - 09/24/21 0901   ? ? Visit Number 1   ? Number of Visits 12   ? Date for PT Re-Evaluation 10/24/21   ? PT Start Time [phone]   ? PT Stop Time 4854   ? PT Time Calculation (min) 42 min   ? Activity Tolerance Patient tolerated treatment well   ? Behavior During Therapy [facility] for tasks assessed/performed   ? ?  ?  ? ?  ? ? ?Past Medical History:  ?Diagnosis Date  ? Back pain   ? DVT (deep venous thrombosis) ([place]) 02/02/2019  ? had DVT first that moved into lung  ? GERD (gastroesophageal reflux disease)   ? History of pulmonary embolus (PE) 02/02/2019  ? confirmed by CT chest- had DVT first that moved into lungs per patient  ? Hypertension   ? Ovarian cancer ([place])   ? Pessary maintenance   ? Stroke [facility])   ? ? ?Past Surgical History:  ?Procedure Laterality Date  ? ABDOMINAL HYSTERECTOMY    ? ovaries left  ? BACK SURGERY    ? DEBULKING Bilateral 06/06/2019  ? Procedure: RADICAL TUMOR DEBULKING;  Surgeon: Everitt Amber, MD;  Location: [facility];  Service: Gynecology;  Laterality: Bilateral;  ? FRACTURE SURGERY    ? IR IMAGING GUIDED PORT INSERTION  12/15/2018  ? OMENTECTOMY Bilateral 06/06/2019  ? Procedure: OMENTECTOMY;  Surgeon: Everitt Amber, MD;  Location: [facility];  Service: Gynecology;  Laterality: Bilateral;  ? SALPINGOOPHORECTOMY Bilateral 06/06/2019  ? Procedure: EXPLORATORY LAPAROTOMY,OPEN RIGHT SALPINGO OOPHORECTOMY;  Surgeon: Everitt Amber, MD;  Location: [facility];  Service: Gynecology;  Laterality: Bilateral;  ? ? ?There were no vitals filed for this visit. ? ? ? Subjective Assessment - 09/24/21 0903   ? ? Subjective Patient reports that her back and right hip have been bothering her for  years. However, her pain has been steadily getting worse over the last few months. She notes that her low back and right hip always hurt together. She uses a cane when walking out in the community, but she will not use it around her house.   ? Pertinent History OA, active cancer, multiple back surgeries,   ? How long can you stand comfortably? "painful whenever I stand"   ? How long can you walk comfortably? "painful whenever I walk"   ? Patient Stated Goals be able to walk with less pain   ? Currently in Pain? Yes   ? Pain Score 8    ? Pain Location Back   ? Pain Orientation Right;Left;Posterior   ? Pain Descriptors / Indicators Aching;Sore;Shooting   ? Pain Type Chronic pain   ? Pain Radiating Towards pain radiates into the right hip and along the lateral hip (following IT band)   ? Pain Onset More than a month ago   ? Pain Frequency Constant   ? Aggravating Factors  transfers and walking   ? Pain Relieving Factors pressing on her back   ? Effect of Pain on Daily Activities "I can't make my bed or vacuum due to the pain"   ? ?  ?  ? ?  ? ? ? ? ? [facility] PT Assessment - 09/24/21 0001   ? ?  ?  Assessment  ? Medical Diagnosis Trocanteric bursitis of right hip   ? Referring Provider (PT) [place], [place]   ? Onset Date/Surgical Date --   years ago  ? Next MD Visit 09/2021   ? Prior Therapy No   ?  ? Precautions  ? Precautions None   ?  ? Restrictions  ? Weight Bearing Restrictions No   ?  ? Balance Screen  ? Has the patient fallen in the past 6 months No   ? Has the patient had a decrease in activity level because of a fear of falling?  No   ? Is the patient reluctant to leave their home because of a fear of falling?  No   ?  ? Home Environment  ? Living Environment Private residence   ? Living Arrangements Alone   ? Home Access Stairs to enter   ? Entrance Stairs-Number of Steps 1   ? Home Layout One level   ? [place] - single point   ?  ? Prior Function  ? Level of Independence Independent   ? Leisure go to  church, going out to eat with her friends   ?  ? Cognition  ? Overall Cognitive Status Within Functional Limits for tasks assessed   ? Memory Appears intact   ? Awareness Appears intact   ? Problem Solving Appears intact   ?  ? Sensation  ? Additional Comments Patient reports no numbness or tingling   ?  ? ROM / Strength  ? AROM / PROM / Strength Strength;AROM   ?  ? AROM  ? AROM Assessment Site Hip   ? Right/Left Hip Right;Left   ? Right Hip Flexion 105   nonpainful  ? Left Hip Flexion 114   ?  ? Strength  ? Strength Assessment Site Hip;Knee;Ankle   ? Right/Left Hip Right;Left   ? Right Hip Flexion 4-/5   familiar hip pain  ? Left Hip Flexion 4/5   ? Right/Left Knee Right;Left   ? Right Knee Flexion 4+/5   ? Right Knee Extension 4/5   lateral hip pain  ? Left Knee Flexion 5/5   ? Left Knee Extension 5/5   ? Right/Left Ankle Right;Left   ? Right Ankle Dorsiflexion 4/5   ? Left Ankle Dorsiflexion 4/5   ?  ? Palpation  ? Palpation comment TTP: right IT band, TFL, hamstring, quadriceps, adductors gluteals, piriformis, QL, and lumbar paraspinals   ?  ? Special Tests  ?  Special Tests Hip Special Tests   ? Hip Special Tests  Hip Scouring   ?  ? Hip Scouring  ? Findings Negative   ? Side Right   ?  ? Bed Mobility  ? Bed Mobility Rolling Left;Rolling Right;Supine to Sit;Sit to Supine   ? Rolling Right Independent   slow and painful  ? Rolling Left Independent   slow and painful  ? Supine to Sit Independent   slow and painful  ? Sit to Supine Independent   slow and painful  ?  ? Ambulation/Gait  ? Ambulation/Gait Yes   ? Ambulation/Gait Assistance 6: Modified independent (Device/Increase time)   ? Assistive device Straight cane   ? Gait Pattern Step-through pattern;Decreased stride length   ? Ambulation Surface Level;Indoor   ? Gait velocity decreased   ? ?  ?  ? ?  ? ? ? ? ? ? ? ? ? ? ? ? ? ?Objective measurements completed on examination: See above findings.  ? ? ? ? ?  [facility] Adult PT Treatment/Exercise - 09/24/21 0001    ? ?  ? Exercises  ? Exercises Knee/Hip   ?  ? Knee/Hip Exercises: Supine  ? Hip Adduction Isometric Both;20 reps   5 second hold  ? Other Supine Knee/Hip Exercises lower trunk rotations   2 minutes  ? Other Supine Knee/Hip Exercises Glute sets   20 reps; 5 second hold  ?  ? Manual Therapy  ? Manual Therapy Soft tissue mobilization   ? Soft tissue mobilization right IT band and TFL   ? ?  ?  ? ?  ? ? ? ? ? ? ? ? ? ? ? ? ? ? ? PT Long Term Goals - 09/24/21 1022   ? ?  ? PT LONG TERM GOAL #1  ? Title Patient will be independent with her HEP.   ? Time 4   ? Period Weeks   ? Status New   ? Target Date 10/22/21   ?  ? PT LONG TERM GOAL #2  ? Title Patient will be able to walk at least 10 minutes without being limited by her familiar right hip pain.   ? Time 4   ? Period Weeks   ? Status New   ? Target Date 10/22/21   ?  ? PT LONG TERM GOAL #3  ? Title Patient will be able to complete her daily activities without her familiar right hip pain exceeding 5/10.   ? Time 4   ? Period Weeks   ? Status New   ? Target Date 10/22/21   ? ?  ?  ? ?  ? ? ? ? ? ? ? ? ? Plan - 09/24/21 1005   ? ? Clinical Impression Statement Patient is a 81 year old female presenting to physical therapy with chronic low back and right hip pain. She presented today wiht moderate pain severity and irritability. Her familiar pain was able to be reproduced with manual muscle testing to the right hip in addition to palpation to the lateral hip. She was provided a HEP which she was able to safely and properly demonstrate. She reported feeling comfortable with these interventions. Recommend that she continue with skilled physical therapy to address her remaining impairments to return to her prior level of function.   ? Personal Factors and Comorbidities Comorbidity 3+;Time since onset of injury/illness/exacerbation   ? Comorbidities OA, active ovarian cancer, HTN, CKD,   ? Examination-Activity Limitations Locomotion Level;Transfers;Bed Mobility;Squat;Stand    ? Examination-Participation Restrictions Meal Prep;Cleaning;Community Activity;Shop;Laundry;[place] Work   ? Stability/Clinical Decision Making Evolving/Moderate complexity   ? Clinical Decision Making Moderate   ?

## 2021-09-25 ENCOUNTER — Inpatient Hospital Stay: Payer: Medicare Other

## 2021-09-25 ENCOUNTER — Encounter: Payer: Self-pay | Admitting: Hematology and Oncology

## 2021-09-25 ENCOUNTER — Telehealth: Payer: Self-pay

## 2021-09-25 ENCOUNTER — Inpatient Hospital Stay: Payer: Medicare Other | Admitting: Hematology and Oncology

## 2021-09-25 VITALS — BP 145/76 | HR 85 | Temp 98.3°F | Resp 18 | Ht 67.0 in | Wt 219.0 lb

## 2021-09-25 DIAGNOSIS — I10 Essential (primary) hypertension: Secondary | ICD-10-CM

## 2021-09-25 DIAGNOSIS — I2699 Other pulmonary embolism without acute cor pulmonale: Secondary | ICD-10-CM

## 2021-09-25 DIAGNOSIS — D61818 Other pancytopenia: Secondary | ICD-10-CM

## 2021-09-25 DIAGNOSIS — I1310 Hypertensive heart and chronic kidney disease without heart failure, with stage 1 through stage 4 chronic kidney disease, or unspecified chronic kidney disease: Secondary | ICD-10-CM | POA: Diagnosis not present

## 2021-09-25 DIAGNOSIS — C561 Malignant neoplasm of right ovary: Secondary | ICD-10-CM

## 2021-09-25 DIAGNOSIS — Z5111 Encounter for antineoplastic chemotherapy: Secondary | ICD-10-CM | POA: Diagnosis not present

## 2021-09-25 DIAGNOSIS — C786 Secondary malignant neoplasm of retroperitoneum and peritoneum: Secondary | ICD-10-CM | POA: Diagnosis not present

## 2021-09-25 DIAGNOSIS — Z9221 Personal history of antineoplastic chemotherapy: Secondary | ICD-10-CM | POA: Diagnosis not present

## 2021-09-25 DIAGNOSIS — Z79899 Other long term (current) drug therapy: Secondary | ICD-10-CM | POA: Diagnosis not present

## 2021-09-25 DIAGNOSIS — Z7901 Long term (current) use of anticoagulants: Secondary | ICD-10-CM | POA: Diagnosis not present

## 2021-09-25 DIAGNOSIS — N183 Chronic kidney disease, stage 3 unspecified: Secondary | ICD-10-CM | POA: Diagnosis not present

## 2021-09-25 DIAGNOSIS — Z7189 Other specified counseling: Secondary | ICD-10-CM

## 2021-09-25 MED ORDER — SODIUM CHLORIDE 0.9 % IV SOLN
150.0000 mg | Freq: Once | INTRAVENOUS | Status: AC
  Administered 2021-09-25: 150 mg via INTRAVENOUS
  Filled 2021-09-25: qty 150

## 2021-09-25 MED ORDER — FAMOTIDINE IN NACL 20-0.9 MG/50ML-% IV SOLN
INTRAVENOUS | Status: AC
  Filled 2021-09-25: qty 50

## 2021-09-25 MED ORDER — SODIUM CHLORIDE 0.9 % IV SOLN
10.0000 mg | Freq: Once | INTRAVENOUS | Status: AC
  Administered 2021-09-25: 10 mg via INTRAVENOUS
  Filled 2021-09-25: qty 10

## 2021-09-25 MED ORDER — DIPHENHYDRAMINE HCL 50 MG/ML IJ SOLN
25.0000 mg | Freq: Once | INTRAMUSCULAR | Status: AC
  Administered 2021-09-25: 25 mg via INTRAVENOUS
  Filled 2021-09-25: qty 1

## 2021-09-25 MED ORDER — DEXTROSE 5 % IV SOLN: Freq: Once | INTRAVENOUS | Status: AC

## 2021-09-25 MED ORDER — HEPARIN SOD (PORK) LOCK FLUSH 100 UNIT/ML IV SOLN
500.0000 [IU] | Freq: Once | INTRAVENOUS | Status: AC | PRN
  Administered 2021-09-25: 500 [IU]

## 2021-09-25 MED ORDER — FAMOTIDINE IN NACL 20-0.9 MG/50ML-% IV SOLN
20.0000 mg | Freq: Once | INTRAVENOUS | Status: AC
  Administered 2021-09-25: 20 mg via INTRAVENOUS
  Filled 2021-09-25: qty 50

## 2021-09-25 MED ORDER — DOXORUBICIN HCL LIPOSOMAL CHEMO INJECTION 2 MG/ML
23.5000 mg/m2 | Freq: Once | INTRAVENOUS | Status: AC
  Administered 2021-09-25: 50 mg via INTRAVENOUS
  Filled 2021-09-25: qty 25

## 2021-09-25 MED ORDER — PALONOSETRON HCL INJECTION 0.25 MG/5ML
0.2500 mg | Freq: Once | INTRAVENOUS | Status: AC
  Administered 2021-09-25: 0.25 mg via INTRAVENOUS
  Filled 2021-09-25: qty 5

## 2021-09-25 MED ORDER — SODIUM CHLORIDE 0.9 % IV SOLN
278.7000 mg | Freq: Once | INTRAVENOUS | Status: AC
  Administered 2021-09-25: 280 mg via INTRAVENOUS
  Filled 2021-09-25: qty 28

## 2021-09-25 MED ORDER — SODIUM CHLORIDE 0.9% FLUSH
10.0000 mL | INTRAVENOUS | Status: DC | PRN
  Administered 2021-09-25: 10 mL

## 2021-09-25 MED ORDER — DIPHENHYDRAMINE HCL 50 MG/ML IJ SOLN
INTRAMUSCULAR | Status: AC
  Filled 2021-09-25: qty 1

## 2021-09-25 NOTE — Telephone Encounter (Signed)
-----   Message from [name], MD sent at 09/25/2021  1:16 PM EDT ----- ?Pls schedule ECHO end of the month, thanks ? ?

## 2021-09-25 NOTE — Assessment & Plan Note (Signed)
She has pancytopenia due to recent treatment ?The patient is given a copy of her CBC ?She does not need blood transfusion support today ?There is no contraindication to remain on antiplatelet agents or anticoagulants as long as the platelet is greater than 50,000. ? ?

## 2021-09-25 NOTE — Assessment & Plan Note (Signed)
I have reviewed multiple imaging studies with the patient and family ?Overall, she has positive response to treatment ?This is at the expense of severe pancytopenia with history of infection ?Moving forward, we discussed the risk and benefits of continuing treatment with combination of carboplatin and Doxil, continuing treatment with Doxil only I will take a break from treatment ?After much discussion, she is in agreement to proceed with combination of carboplatin and Doxil ?She is made aware about risk of cardiomyopathy with Doxil and risk of worsening pancytopenia and allergic reaction with carboplatin ?I will order echocardiogram before her next cycle of treatment ?We will proceed with treatment today without delay ?

## 2021-09-25 NOTE — Telephone Encounter (Signed)
Called and given appt for 4/25 at 10 am for echo at [facility]. She verbalized understanding. ?

## 2021-09-25 NOTE — Progress Notes (Signed)
[place] [facility] ?OFFICE PROGRESS NOTE ? ?Patient Care Team: ?Caryl Bis, MD as PCP - General (Family Medicine) ?Awanda Mink Craige Cotta, RN as Oncology Nurse Navigator (Oncology) ? 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[place]) ?I have reviewed multiple imaging studies with the patient and family ?Overall, she has positive response to treatment ?This is at the expense of severe pancytopenia with history of infection ?Moving forward, we discussed the risk and benefits of continuing treatment with combination of carboplatin and Doxil, continuing treatment with Doxil only I will take a break from treatment ?After much discussion, she is in agreement to proceed with combination of carboplatin and Doxil ?She is made aware about risk of cardiomyopathy with Doxil and risk of worsening pancytopenia and allergic reaction with carboplatin ?I will order echocardiogram before her next cycle of treatment ?We will proceed with treatment today without delay ? ?Pancytopenia, acquired ([place]) ?She has pancytopenia due to recent treatment ?The patient is given a copy of her CBC ?She does not need blood transfusion support today ?There is no contraindication to remain on antiplatelet agents or anticoagulants as long as the platelet is greater than 50,000. ? ? ?PE (pulmonary thromboembolism) ([place]) ?She had minimal bleeding ?She will continue anticoagulation therapy indefinitely ?There is no contraindication to remain on antiplatelet agents or anticoagulants as long as the platelet is greater than 50,000. ? ? ? ?Essential hypertension ?Her blood pressure is elevated today, likely due to anxiety ?We will proceed with treatment next week without delay ? ?Orders Placed This Encounter  ?Procedures  ? ECHOCARDIOGRAM COMPLETE  ?  Standing Status:   Future  ?  Standing Expiration Date:   09/26/2022  ?  Order Specific Question:   Where should this test be performed  ?  Answer:   [facility]  ?  Order Specific Question:    Perflutren DEFINITY (image enhancing agent) should be administered unless hypersensitivity or allergy exist  ?  Answer:   Administer Perflutren  ?  Order Specific Question:   Reason for exam-Echo  ?  Answer:   Chemo  Z09  ? ? ?All questions were answered. The patient knows to call the clinic with any problems, questions or concerns. ?The total time spent in the appointment was 40 minutes encounter with patients including review of chart and various tests results, discussions about plan of care and coordination of care plan ?  ?[name], MD ?09/25/2021 1:17 PM ? ?INTERVAL HISTORY: ?Please see below for problem oriented charting. ?she returns for treatment follow-up seen prior to chemotherapy and follow-up on imaging study results ?She is doing well since last time I saw her ?She had minimum signs of bleeding ?No recent infection, fever or chills ?Denies chest pain or shortness of breath ? ?REVIEW OF SYSTEMS:   ?Constitutional: Denies fevers, chills or abnormal weight loss ?Eyes: Denies blurriness of vision ?Ears, nose, mouth, throat, and face: Denies mucositis or sore throat ?Respiratory: Denies cough, dyspnea or wheezes ?Cardiovascular: Denies palpitation, chest discomfort or lower extremity swelling ?Gastrointestinal:  Denies nausea, heartburn or change in bowel habits ?Skin: Denies abnormal skin rashes ?Lymphatics: Denies new lymphadenopathy or easy bruising ?Neurological:Denies numbness, tingling or new weaknesses ?Behavioral/Psych: Mood is stable, no new changes  ?All other systems were reviewed with the patient and are negative. ? ?I have reviewed the past medical history, past surgical history, social history and family history with the patient and they are unchanged from previous note. ? ?ALLERGIES:  has No Known Allergies. ? ?MEDICATIONS:  ?  Current Outpatient Medications  ?Medication Sig Dispense Refill  ? acetaminophen (TYLENOL) 325 MG tablet Take 650 mg by mouth every 6 (six) hours as needed for moderate  pain or headache.     ? atenolol (TENORMIN) 25 MG tablet TAKE ONE (1) TABLET BY MOUTH EVERY DAY 30 tablet 3  ? Docusate Sodium 100 MG capsule Take 100 mg by mouth 2 (two) times daily as needed for constipation.    ? ELIQUIS 5 MG TABS tablet TAKE ONE TABLET BY MOUTH TWICE DAILY (Patient taking differently: Take 5 mg by mouth 2 (two) times daily.) 60 tablet 5  ? fexofenadine (ALLEGRA) 180 MG tablet Take 180 mg by mouth daily as needed for allergies or rhinitis.    ? levothyroxine (SYNTHROID) 75 MCG tablet Take 1 tablet (75 mcg total) by mouth daily before breakfast. 30 tablet 3  ? lidocaine-prilocaine (EMLA) cream Apply 1 application topically daily as needed (port access). 30 g 3  ? Melatonin 3 MG TABS Take 3 mg by mouth at bedtime as needed (sleep).    ? ondansetron (ZOFRAN) 8 MG tablet Take 1 tablet (8 mg total) by mouth 2 (two) times daily as needed (Nausea or vomiting). 30 tablet 1  ? polyethylene glycol (MIRALAX / GLYCOLAX) 17 g packet Take 17 g by mouth daily. 14 each 0  ? prochlorperazine (COMPAZINE) 10 MG tablet Take 1 tablet (10 mg total) by mouth every 6 (six) hours as needed (Nausea or vomiting). 30 tablet 1  ? senna-docusate (SENOKOT-S) 8.6-50 MG tablet Take 2 tablets by mouth at bedtime. (Patient taking differently: Take 2 tablets by mouth at bedtime as needed for mild constipation.) 60 tablet 3  ? ?No current facility-administered medications for this visit.  ? ?Facility-Administered Medications Ordered in Other Visits  ?Medication Dose Route Frequency Provider Last Rate Last Admin  ? CARBOplatin (PARAPLATIN) 280 mg in sodium chloride 0.9 % 100 mL chemo infusion  280 mg Intravenous Once Alvy Bimler, Shirly Bartosiewicz, MD      ? DOXOrubicin HCL LIPOSOMAL (DOXIL) 50 mg in dextrose 5 % 250 mL chemo infusion  23.5 mg/m2 (Treatment Plan Recorded) Intravenous Once Alvy Bimler, Jozi Malachi, MD      ? heparin lock flush 100 unit/mL  500 Units Intracatheter Once PRN Alvy Bimler, Eiliana Drone, MD      ? sodium chloride flush (NS) 0.9 % injection 10 mL  10 mL  Intracatheter PRN Alvy Bimler, Janina Trafton, MD   10 mL at 08/19/21 1506  ? sodium chloride flush (NS) 0.9 % injection 10 mL  10 mL Intracatheter PRN [name], MD      ? ? ?SUMMARY OF ONCOLOGIC HISTORY: ?Oncology History Overview Note  ?High grade serous ?Neg genetics ?Intolerance to niraparib ?Progressed on Taxol ?  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 Her symptoms began in April/May, 2020.  ?She has bloating and early satiety. Shortness of breath with walking. She denied bleeding. She reported constipation with pain with defecation and narrowed stools ?  ?11/29/2018 Imaging  ? 1. 13 cm complex cystic lesion in the central pelvis, highly suspicious for ovarian cystadenocarcinoma. ?2. Diffuse peritoneal carcinomatosis with mild ascites. ?3. Mild lymphadenopathy in porta hepatis and right cardiophrenic angle, suspicious for metastatic disease. ?4. Moderate right and tiny left pleural effusions ?  ?12/05/2018 Tumor Marker  ? Patient's tumor was tested for the following markers: CA-125 ?Results of the tumor marker test revealed 1015. ?  ?12/06/2018 Cancer Staging  ? Staging form: Ovary, Fallopian Tube, and Primary Peritoneal Carcinoma, AJCC 8th  Edition ?- Clinical: FIGO Stage IVA, calculated as Stage IV (cT3c, cN1, cM1) - Signed by [name], MD on 12/06/2018 ? ?  ?12/09/2018 Pathology Results  ? PLEURAL FLUID, RIGHT (SPECIMEN 1 OF 1 COLLECTED 12/09/18): ?- MALIGNANT CELLS CONSISTENT WITH METASTATIC ADENOCARCINOMA ?- SEE COMMENT ?Comment ?The neoplastic cells are positive for cytokeratin 7 and Pax-8 but negative for cytokeratin 20, TTF-1, CDX-2 and Gata-3. Overall, the phenotype is consistent with the clinical impression of gynecologic primary.  ?  ?12/15/2018 Procedure  ? Placement of single lumen port a cath via right internal jugular vein. The catheter tip lies at the cavo-atrial junction. A power injectable port a cath was placed and is ready for immediate use ?  ?12/16/2018 - 08/24/2019 Chemotherapy   ? The patient had carboplatin and taxol for chemotherapy treatment.  She had 7 cycles given neoadjuvant prior to surgery and 3 more cycles after surgery, for a total of 10 cycles of treatment ? ?  ?01/06/2019

## 2021-09-25 NOTE — Assessment & Plan Note (Signed)
Her blood pressure is elevated today, likely due to anxiety ?We will proceed with treatment next week without delay ?

## 2021-09-25 NOTE — Patient Instructions (Signed)
[facility]  Discharge Instructions: ?Thank you for choosing [facility] to provide your oncology and hematology care.  ? ?If you have a lab appointment with the [facility], please go directly to the [facility] and check in at the registration area. ?  ?Wear comfortable clothing and clothing appropriate for easy access to any Portacath or PICC line.  ? ?We strive to give you quality time with your provider. You may need to reschedule your appointment if you arrive late (15 or more minutes).  Arriving late affects you and other patients whose appointments are after yours.  Also, if you miss three or more appointments without notifying the office, you may be dismissed from the clinic at the provider?s discretion.    ?  ?For prescription refill requests, have your pharmacy contact our office and allow 72 hours for refills to be completed.   ? ?Today you received the following chemotherapy and/or immunotherapy agents: carboplatin, doxorubicin    ?  ?To help prevent nausea and vomiting after your treatment, we encourage you to take your nausea medication as directed. ? ?BELOW ARE SYMPTOMS THAT SHOULD BE REPORTED IMMEDIATELY: ?*FEVER GREATER THAN 100.4 F (38 ?C) OR HIGHER ?*CHILLS OR SWEATING ?*NAUSEA AND VOMITING THAT IS NOT CONTROLLED WITH YOUR NAUSEA MEDICATION ?*UNUSUAL SHORTNESS OF BREATH ?*UNUSUAL BRUISING OR BLEEDING ?*URINARY PROBLEMS (pain or burning when urinating, or frequent urination) ?*BOWEL PROBLEMS (unusual diarrhea, constipation, pain near the anus) ?TENDERNESS IN MOUTH AND THROAT WITH OR WITHOUT PRESENCE OF ULCERS (sore throat, sores in mouth, or a toothache) ?UNUSUAL RASH, SWELLING OR PAIN  ?UNUSUAL VAGINAL DISCHARGE OR ITCHING  ? ?Items with * indicate a potential emergency and should be followed up as soon as possible or go to the Emergency Department if any problems should occur. ? ?Please show the CHEMOTHERAPY ALERT CARD or IMMUNOTHERAPY ALERT CARD  at check-in to the Emergency Department and triage nurse. ? ?Should you have questions after your visit or need to cancel or reschedule your appointment, please contact [facility]  Dept: [phone]  and follow the prompts.  Office hours are 8:00 a.m. to 4:30 p.m. Monday - Friday. Please note that voicemails left after 4:00 p.m. may not be returned until the following business day.  We are closed weekends and major holidays. You have access to a nurse at all times for urgent questions. Please call the main number to the clinic Dept: [phone] and follow the prompts. ? ? ?For any non-urgent questions, you may also contact your provider using MyChart. We now offer e-Visits for anyone 8 and older to request care online for non-urgent symptoms. For details visit [email or web address]. ?  ?Also download the MyChart app! Go to the app store, search "MyChart", open the app, select [facility], and log in with your MyChart username and password. ? ?Due to Covid, a mask is required upon entering the hospital/clinic. If you do not have a mask, one will be given to you upon arrival. For doctor visits, patients may have 1 support person aged 72 or older with them. For treatment visits, patients cannot have anyone with them due to current Covid guidelines and our immunocompromised population.  ? ?

## 2021-09-25 NOTE — Assessment & Plan Note (Signed)
She had minimal bleeding ?She will continue anticoagulation therapy indefinitely ?There is no contraindication to remain on antiplatelet agents or anticoagulants as long as the platelet is greater than 50,000. ? ? ?

## 2021-09-26 ENCOUNTER — Ambulatory Visit: Payer: Medicare Other

## 2021-09-26 DIAGNOSIS — M25551 Pain in right hip: Secondary | ICD-10-CM | POA: Diagnosis not present

## 2021-09-26 NOTE — Therapy (Signed)
[place] [facility] ?[address] ?[place], [place], [address] ?Phone: [phone]   Fax:  [phone] ? ?Physical Therapy Treatment ? ?Patient Details  ?Name: Paula Peterson ?MRN: [id number] ?Date of Birth: 1940-12-19 ?Referring Provider (PT): [place], [place] ? ? ?Encounter Date: 09/26/2021 ? ? PT End of Session - 09/26/21 2440   ? ? Visit Number 2   ? Number of Visits 12   ? Date for PT Re-Evaluation 10/24/21   ? PT Start Time 0815   ? PT Stop Time 0900   ? PT Time Calculation (min) 45 min   ? Activity Tolerance Patient tolerated treatment well   ? Behavior During Therapy [facility] for tasks assessed/performed   ? ?  ?  ? ?  ? ? ?Past Medical History:  ?Diagnosis Date  ? Back pain   ? DVT (deep venous thrombosis) ([place]) 02/02/2019  ? had DVT first that moved into lung  ? GERD (gastroesophageal reflux disease)   ? History of pulmonary embolus (PE) 02/02/2019  ? confirmed by CT chest- had DVT first that moved into lungs per patient  ? Hypertension   ? Ovarian cancer ([place])   ? Pessary maintenance   ? Stroke [facility])   ? ? ?Past Surgical History:  ?Procedure Laterality Date  ? ABDOMINAL HYSTERECTOMY    ? ovaries left  ? BACK SURGERY    ? DEBULKING Bilateral 06/06/2019  ? Procedure: RADICAL TUMOR DEBULKING;  Surgeon: Everitt Amber, MD;  Location: [facility];  Service: Gynecology;  Laterality: Bilateral;  ? FRACTURE SURGERY    ? IR IMAGING GUIDED PORT INSERTION  12/15/2018  ? OMENTECTOMY Bilateral 06/06/2019  ? Procedure: OMENTECTOMY;  Surgeon: Everitt Amber, MD;  Location: [facility];  Service: Gynecology;  Laterality: Bilateral;  ? SALPINGOOPHORECTOMY Bilateral 06/06/2019  ? Procedure: EXPLORATORY LAPAROTOMY,OPEN RIGHT SALPINGO OOPHORECTOMY;  Surgeon: Everitt Amber, MD;  Location: [facility];  Service: Gynecology;  Laterality: Bilateral;  ? ? ?There were no vitals filed for this visit. ? ? Subjective Assessment - 09/26/21 0810   ? ? Subjective Pt arrives for today's treatment session reporting 8/10 right hip pain.  Pt  states that she is very tired today due to having a chemo treatment yesterday.   ? Pertinent History OA, active cancer, multiple back surgeries,   ? How long can you stand comfortably? "painful whenever I stand"   ? How long can you walk comfortably? "painful whenever I walk"   ? Patient Stated Goals be able to walk with less pain   ? Pain Onset More than a month ago   ? ?  ?  ? ?  ? ? ? ? ? ? ? ? ? ? ? ? ? ? ? ? ? ? ? ? [place] Adult PT Treatment/Exercise - 09/26/21 0001   ? ?  ? Knee/Hip Exercises: Aerobic  ? Nustep Lvl 3 x 15 mins   ?  ? Knee/Hip Exercises: Seated  ? Long [employer] Strengthening;Both;15 reps   ? Long Arc Quad Weight 2 lbs.   ? [employer] 25 reps   ? Clamshell with TheraBand Red   ? Marching Strengthening;Both;15 reps   ? Marching Weights 2 lbs.   ?  ? Manual Therapy  ? Manual Therapy Soft tissue mobilization   ? Soft tissue mobilization STM/W to right IT band and TFL with pt in left side-lying   ? ?  ?  ? ?  ? ? ? ? ? ? ? ? ? ? ? ? ? ? ?  PT Long Term Goals - 09/24/21 1022   ? ?  ? PT LONG TERM GOAL #1  ? Title Patient will be independent with her HEP.   ? Time 4   ? Period Weeks   ? Status New   ? Target Date 10/22/21   ?  ? PT LONG TERM GOAL #2  ? Title Patient will be able to walk at least 10 minutes without being limited by her familiar right hip pain.   ? Time 4   ? Period Weeks   ? Status New   ? Target Date 10/22/21   ?  ? PT LONG TERM GOAL #3  ? Title Patient will be able to complete her daily activities without her familiar right hip pain exceeding 5/10.   ? Time 4   ? Period Weeks   ? Status New   ? Target Date 10/22/21   ? ?  ?  ? ?  ? ? ? ? ? ? ? ? Plan - 09/26/21 0813   ? ? Clinical Impression Statement Pt arrives for today's treamtent session reporting 8/10 right hip and back pain.  Pt reports increased fatigue today due to having a chemo treatment yesterday.  Pt able to tolerate introduction to Nustep today for warm up and increased activity tolerance.  Pt instructed in seated  resisted exercises to increase strength and function while decreasing pain.  Pt requiring min cues for proper technique and posture.  STW/M performed to right IT band and TFL with pt in left side-lying to decrease pain and tone.  Pt exhibiting pain with all bed mobility and transfers.  Pt reporting 7/10 right hip and back pain at completion of today's treatment session.   ? Personal Factors and Comorbidities Comorbidity 3+;Time since onset of injury/illness/exacerbation   ? Comorbidities OA, active ovarian cancer, HTN, CKD,   ? Examination-Activity Limitations Locomotion Level;Transfers;Bed Mobility;Squat;Stand   ? Examination-Participation Restrictions Meal Prep;Cleaning;Community Activity;Shop;Laundry;[place] Work   ? Stability/Clinical Decision Making Evolving/Moderate complexity   ? Rehab Potential Good   ? PT Frequency 3x / week   ? PT Duration 4 weeks   ? PT Treatment/Interventions ADLs/Self Care Home Management;Cryotherapy;Neuromuscular re-education;Therapeutic exercise;Therapeutic activities;Functional mobility training;Stair training;Gait training;Patient/family education;Manual techniques;Passive range of motion;Energy conservation   ? PT Next Visit Plan nustep, hip isometrics, and hip strengthening   ? PT Home Exercise Plan Access Code: [email or web address]  URL: [email or web address]  Date: 09/24/2021  Prepared by: Jacqulynn Cadet    Exercises  - Supine Lower Trunk Rotation  - 2 x daily - 7 x weekly - 2 sets - 10 reps  - Hooklying Gluteal Sets  - 2 x daily - 7 x weekly - 2 sets - 10 reps - 5 seconds hold  - Seated Gluteal Sets  - 2 x daily - 7 x weekly - 2 sets - 10 reps - 5 seconds hold  - Supine Hip Adduction Isometric with Ball  - 2 x daily - 7 x weekly - 2 sets - 10 reps - 5 seconds  hold  - Seated Hip Adduction Isometrics with Ball  - 2 x daily - 7 x weekly - 2 sets - 10 reps - 5 seconds hold   ? Consulted and Agree with Plan of Care Patient   ? ?  ?  ? ?  ? ? ?Patient will benefit from skilled  therapeutic intervention in order to improve the following deficits and impairments:  Abnormal gait, Decreased range of motion, Difficulty walking, Decreased activity tolerance, Pain,  Decreased strength, Decreased mobility ? ?Visit Diagnosis: ?Pain in right hip ? ? ? ? ?Problem List ?Patient Active Problem List  ? Diagnosis Date Noted  ? Inflammatory arthritis 08/19/2021  ? Deficiency anemia 08/19/2021  ? SBO (small bowel obstruction) ([place]) 06/26/2021  ? Anemia due to antineoplastic chemotherapy 02/28/2021  ? Skin infection 02/14/2021  ? Ear pain 02/07/2021  ? Drug-induced neutropenia ([place]) 01/17/2021  ? Headache disorder 10/14/2020  ? CKD (chronic kidney disease), stage III ([place]) 10/01/2020  ? Hand foot syndrome 09/10/2020  ? Pulmonary embolism ([place]) 07/15/2020  ? Hypokalemia 07/15/2020  ? Essential hypertension 07/15/2020  ? Lower back pain 05/28/2020  ? Elevated serum creatinine 05/01/2020  ? Recurrent UTI 04/03/2020  ? Genetic testing 08/11/2019  ? Adverse effect of chemotherapy 03/14/2019  ? Pancytopenia, acquired ([place]) 03/10/2019  ? Peripheral neuropathy due to chemotherapy ([place]) 03/10/2019  ? Epigastric pain 02/09/2019  ? PE (pulmonary thromboembolism) ([place]) 02/02/2019  ? Chronic hip pain 01/27/2019  ? Acquired hypothyroidism 01/27/2019  ? Family history of stomach cancer   ? Goals of care, counseling/discussion 01/06/2019  ? Elevated liver enzymes 01/06/2019  ? Other fatigue 01/06/2019  ? Other constipation 12/07/2018  ? 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 ? ?Kathrynn Ducking, PTA ?09/26/2021, 9:10 AM ? ?[place] ?[facility] ?[address] ?[place], [place], [address] ?Phone: [phone]   Fax:  [phone] ? ?Name: Sharla Tankard Lukins ?MRN: [id number] ?Date of Birth: 08-21-1940 ? ? ? ?

## 2021-09-30 ENCOUNTER — Ambulatory Visit: Payer: Medicare Other | Admitting: *Deleted

## 2021-10-01 ENCOUNTER — Other Ambulatory Visit: Payer: Self-pay | Admitting: Hematology and Oncology

## 2021-10-02 ENCOUNTER — Ambulatory Visit: Payer: Medicare Other | Attending: Physician Assistant

## 2021-10-02 DIAGNOSIS — M25551 Pain in right hip: Secondary | ICD-10-CM | POA: Diagnosis not present

## 2021-10-02 NOTE — Therapy (Addendum)
[facility] [place], [place], [address] Phone: [phone]   Fax:  [phone]  Physical Therapy Treatment  Patient Details  Name: Paula Peterson MRN: [id number] Date of Birth: [birth year] Referring Provider (PT): [place], [place]   Encounter Date: 10/02/2021   PT End of Session - 10/02/21 0819     Visit Number 3    Number of Visits 12    Date for PT Re-Evaluation 10/24/21    PT Start Time 0815    PT Stop Time 0900    PT Time Calculation (min) 45 min    Activity Tolerance Patient tolerated treatment well    Behavior During Therapy [facility] for tasks assessed/performed             Past Medical History:  Diagnosis Date   Back pain    DVT (deep venous thrombosis) ([place]) 02/02/2019   had DVT first that moved into lung   GERD (gastroesophageal reflux disease)    History of pulmonary embolus (PE) 02/02/2019   confirmed by CT chest- had DVT first that moved into lungs per patient   Hypertension    Ovarian cancer ([place])    Pessary maintenance    Stroke [facility])     Past Surgical History:  Procedure Laterality Date   ABDOMINAL HYSTERECTOMY     ovaries left   BACK SURGERY     DEBULKING Bilateral 06/06/2019   Procedure: RADICAL TUMOR DEBULKING;  Surgeon: Everitt Amber, MD;  [facility];  Service: Gynecology;  Laterality: Bilateral;   FRACTURE SURGERY     IR IMAGING GUIDED PORT INSERTION  12/15/2018   OMENTECTOMY Bilateral 06/06/2019   Procedure: OMENTECTOMY;  Surgeon: Everitt Amber, MD;  [facility];  Service: Gynecology;  Laterality: Bilateral;   SALPINGOOPHORECTOMY Bilateral 06/06/2019   Procedure: EXPLORATORY LAPAROTOMY,OPEN RIGHT SALPINGO OOPHORECTOMY;  Surgeon: Everitt Amber, MD;  [facility];  Service: Gynecology;  Laterality: Bilateral;    There were no vitals filed for this visit.   Subjective Assessment - 10/02/21 0817     Subjective Patient reports that she has not been doing good since her last round of  chemotherapy. She notes that her back and hip are still hurting, but it felt a little better after her last appointment.    Pertinent History OA, active cancer, multiple back surgeries,    How long can you stand comfortably? "painful whenever I stand"    How long can you walk comfortably? "painful whenever I walk"    Patient Stated Goals be able to walk with less pain    Currently in Pain? Yes    Pain Score 7     Pain Location Hip    Pain Orientation Right    Pain Onset More than a month ago                               [facility] Adult PT Treatment/Exercise - 10/02/21 0001       Knee/Hip Exercises: Aerobic   Nustep L4 x 15 minutes      Knee/Hip Exercises: Supine   Hip Adduction Isometric Both;20 reps   5 second hold   Other Supine Knee/Hip Exercises lower trunk rotations   2 minutes   Other Supine Knee/Hip Exercises Hip Abduction isometric   3 second hold; 20 reps     Manual Therapy   Manual Therapy Soft tissue mobilization    Soft tissue mobilization STM/W to right IT band  and TFL with pt in left side-lying                          PT Long Term Goals - 09/24/21 1022       PT LONG TERM GOAL #1   Title Patient will be independent with her HEP.    Time 4    Period Weeks    Status New    Target Date 10/22/21      PT LONG TERM GOAL #2   Title Patient will be able to walk at least 10 minutes without being limited by her familiar right hip pain.    Time 4    Period Weeks    Status New    Target Date 10/22/21      PT LONG TERM GOAL #3   Title Patient will be able to complete her daily activities without her familiar right hip pain exceeding 5/10.    Time 4    Period Weeks    Status New    Target Date 10/22/21                   Plan - 10/02/21 0827     Clinical Impression Statement Treatment focused on familiar interventions due to persistant hip pain in addition to her fatigue from her recent chemotherapy treatment. She  required minimal cuing with today's isometric interventions for proper force production to facilitate muscular engagement while avoiding an increase in her familiar pain. Manual therapy focused on soft tissue mobilization to the right TFL and IT band for reduced pain and improved mobility. However, this was only temporarily effective. She reported feeling alright upon the conclusion of treatment. She continues to require skilled physical therapy to address her remaining impairments to maximize her functional mobility.    Personal Factors and Comorbidities Comorbidity 3+;Time since onset of injury/illness/exacerbation    Comorbidities OA, active ovarian cancer, HTN, CKD,    Examination-Activity Limitations Locomotion Level;Transfers;Bed Mobility;Squat;Stand    Examination-Participation Restrictions Meal Prep;Cleaning;Community Activity;Shop;Laundry;Yard Work    [employer]chant [employer] officer Evolving/Moderate complexity    Rehab Potential Good    PT Frequency 3x / week    PT Duration 4 weeks    PT Treatment/Interventions ADLs/Self Care Home Management;Cryotherapy;Neuromuscular re-education;Therapeutic exercise;Therapeutic activities;Functional mobility training;Stair training;Gait training;Patient/family education;Manual techniques;Passive range of motion;Energy conservation    PT Next Visit Plan nustep, hip isometrics, and hip strengthening    PT Home Exercise Plan Access Code: [email or web address]  URL: [email or web address]  Date: 09/24/2021  Prepared by: Jacqulynn Cadet    Exercises  - Supine Lower Trunk Rotation  - 2 x daily - 7 x weekly - 2 sets - 10 reps  - Hooklying Gluteal Sets  - 2 x daily - 7 x weekly - 2 sets - 10 reps - 5 seconds hold  - Seated Gluteal Sets  - 2 x daily - 7 x weekly - 2 sets - 10 reps - 5 seconds hold  - Supine Hip Adduction Isometric with Ball  - 2 x daily - 7 x weekly - 2 sets - 10 reps - 5 seconds  hold  - Seated Hip Adduction Isometrics with Ball  - 2 x daily - 7 x  weekly - 2 sets - 10 reps - 5 seconds hold    Consulted and Agree with Plan of Care Patient             Patient will benefit from skilled therapeutic intervention in order to improve the  following deficits and impairments:  Abnormal gait, Decreased range of motion, Difficulty walking, Decreased activity tolerance, Pain, Decreased strength, Decreased mobility  Visit Diagnosis: Pain in right hip     Problem List Patient Active Problem List   Diagnosis Date Noted   Inflammatory arthritis 08/19/2021   Deficiency anemia 08/19/2021   SBO (small bowel obstruction) ([place]) 06/26/2021   Anemia due to antineoplastic chemotherapy 02/28/2021   Skin infection 02/14/2021   Ear pain 02/07/2021   Drug-induced neutropenia ([facility]) 01/17/2021   Headache disorder 10/14/2020   CKD (chronic kidney disease), stage III ([place]) 10/01/2020   Hand foot syndrome 09/10/2020   Pulmonary embolism ([place]) 07/15/2020   Hypokalemia 07/15/2020   Essential hypertension 07/15/2020   Lower back pain 05/28/2020   Elevated serum creatinine 05/01/2020   Recurrent UTI 04/03/2020   Genetic testing 08/11/2019   Adverse effect of chemotherapy 03/14/2019   Pancytopenia, acquired ([place]) 03/10/2019   Peripheral neuropathy due to chemotherapy ([place]) 03/10/2019   Epigastric pain 02/09/2019   PE (pulmonary thromboembolism) ([place]) 02/02/2019   Chronic hip pain 01/27/2019   Acquired hypothyroidism 01/27/2019   Family history of stomach cancer    Goals of care, counseling/discussion 01/06/2019   Elevated liver enzymes 01/06/2019   Other fatigue 01/06/2019   Other constipation 12/07/2018   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    Remaining deficits: Pain, weakness    Education / Equipment: HEP   Patient agrees to discharge. Patient goals were not met. Patient is being discharged due to not returning since the last visit.   Darlin Coco, PT 10/02/2021, 9:04 AM  [facility] [place], [place], [address] Phone: [phone]   Fax:  [phone]  Name: Paula Peterson MRN: [id number] Date of Birth: 08-Oct-1940

## 2021-10-07 ENCOUNTER — Ambulatory Visit: Payer: Medicare Other | Admitting: Physical Therapy

## 2021-10-07 DIAGNOSIS — H40013 Open angle with borderline findings, low risk, bilateral: Secondary | ICD-10-CM | POA: Diagnosis not present

## 2021-10-07 DIAGNOSIS — H43813 Vitreous degeneration, bilateral: Secondary | ICD-10-CM | POA: Diagnosis not present

## 2021-10-07 DIAGNOSIS — H532 Diplopia: Secondary | ICD-10-CM | POA: Diagnosis not present

## 2021-10-08 DIAGNOSIS — M7061 Trochanteric bursitis, right hip: Secondary | ICD-10-CM | POA: Diagnosis not present

## 2021-10-08 DIAGNOSIS — M259 Joint disorder, unspecified: Secondary | ICD-10-CM | POA: Diagnosis not present

## 2021-10-10 ENCOUNTER — Ambulatory Visit: Payer: Medicare Other | Admitting: Physical Therapy

## 2021-10-13 ENCOUNTER — Other Ambulatory Visit: Payer: Self-pay

## 2021-10-13 ENCOUNTER — Telehealth: Payer: Self-pay

## 2021-10-13 MED ORDER — NYSTATIN 100000 UNIT/ML MT SUSP: 5.0000 mL | Freq: Four times a day (QID) | OROMUCOSAL | 0 refills | Status: DC

## 2021-10-13 NOTE — Telephone Encounter (Signed)
Returned her call. She is asking if it is okay to have a steroid injection to her back on 5/3. This will be the day after treatment on 5/2. She is asking if this is okay? If not, when do you suggest she get the injection? ? ?She is complaining of redness and discomfort/pain to her mouth x 2 weeks. In the past her PCP gave her Nystatin and that helped. She is asking if you can prescribe something? ?

## 2021-10-13 NOTE — Telephone Encounter (Signed)
Called and given below message message. She verbalized understanding. Rx sent to her preferred pharmacy. ?

## 2021-10-13 NOTE — Telephone Encounter (Signed)
Yes she can have injection ?Pls call in nystatin swish and spit 4 x daily to her local pharmacy ?

## 2021-10-21 ENCOUNTER — Ambulatory Visit (HOSPITAL_COMMUNITY)
Admission: RE | Admit: 2021-10-21 | Discharge: 2021-10-21 | Disposition: A | Discharge status: Home / Self Care | Payer: Medicare Other | Source: Ambulatory Visit | Attending: Hematology and Oncology | Admitting: Hematology and Oncology

## 2021-10-21 ENCOUNTER — Other Ambulatory Visit (HOSPITAL_COMMUNITY): Payer: Medicare Other

## 2021-10-21 DIAGNOSIS — I340 Nonrheumatic mitral (valve) insufficiency: Secondary | ICD-10-CM | POA: Diagnosis not present

## 2021-10-21 DIAGNOSIS — I2699 Other pulmonary embolism without acute cor pulmonale: Secondary | ICD-10-CM | POA: Diagnosis not present

## 2021-10-21 DIAGNOSIS — I080 Rheumatic disorders of both mitral and aortic valves: Secondary | ICD-10-CM | POA: Insufficient documentation

## 2021-10-21 DIAGNOSIS — I10 Essential (primary) hypertension: Secondary | ICD-10-CM | POA: Diagnosis not present

## 2021-10-21 DIAGNOSIS — C561 Malignant neoplasm of right ovary: Secondary | ICD-10-CM | POA: Insufficient documentation

## 2021-10-21 NOTE — Progress Notes (Signed)
*  PRELIMINARY RESULTS* ?Echocardiogram ?2D Echocardiogram has been performed. ? ?Arlyss Gandy ?10/21/2021, 11:04 AM ?

## 2021-10-23 ENCOUNTER — Other Ambulatory Visit: Payer: Self-pay | Admitting: Hematology and Oncology

## 2021-10-23 ENCOUNTER — Telehealth: Payer: Self-pay | Admitting: Oncology

## 2021-10-23 NOTE — Telephone Encounter (Signed)
Called [facility] about having a tooth that needs to be pulled and to see if she can wait to discuss this with Dr. Alvy Bimler at her appointment on 10/28/21.  She said that will be fine because she does not have an appointment with Dr. Berdine [place], her dentist, until 11/03/21.   ?

## 2021-10-26 LAB — ECHOCARDIOGRAM COMPLETE
AR max vel: 2.32 cm2
AV Area VTI: 2.42 cm2
AV Area mean vel: 2.24 cm2
AV Mean grad: 3.5 mmHg
AV Peak grad: 6.1 mmHg
Ao pk vel: 1.24 m/s
Area-P 1/2: 3.74 cm2
P 1/2 time: 442 msec
S' Lateral: 2.6 cm

## 2021-10-28 ENCOUNTER — Encounter: Payer: Self-pay | Admitting: Hematology and Oncology

## 2021-10-28 ENCOUNTER — Inpatient Hospital Stay: Payer: Medicare Other | Admitting: Hematology and Oncology

## 2021-10-28 ENCOUNTER — Other Ambulatory Visit: Payer: Self-pay

## 2021-10-28 ENCOUNTER — Inpatient Hospital Stay: Payer: Medicare Other | Attending: Gynecologic Oncology

## 2021-10-28 ENCOUNTER — Inpatient Hospital Stay: Payer: Medicare Other

## 2021-10-28 DIAGNOSIS — I2699 Other pulmonary embolism without acute cor pulmonale: Secondary | ICD-10-CM

## 2021-10-28 DIAGNOSIS — Z90721 Acquired absence of ovaries, unilateral: Secondary | ICD-10-CM | POA: Diagnosis not present

## 2021-10-28 DIAGNOSIS — Z7901 Long term (current) use of anticoagulants: Secondary | ICD-10-CM | POA: Insufficient documentation

## 2021-10-28 DIAGNOSIS — N183 Chronic kidney disease, stage 3 unspecified: Secondary | ICD-10-CM

## 2021-10-28 DIAGNOSIS — Z79899 Other long term (current) drug therapy: Secondary | ICD-10-CM | POA: Insufficient documentation

## 2021-10-28 DIAGNOSIS — Z9221 Personal history of antineoplastic chemotherapy: Secondary | ICD-10-CM | POA: Diagnosis not present

## 2021-10-28 DIAGNOSIS — D61818 Other pancytopenia: Secondary | ICD-10-CM | POA: Insufficient documentation

## 2021-10-28 DIAGNOSIS — Z7189 Other specified counseling: Secondary | ICD-10-CM

## 2021-10-28 DIAGNOSIS — C561 Malignant neoplasm of right ovary: Secondary | ICD-10-CM

## 2021-10-28 DIAGNOSIS — Z5111 Encounter for antineoplastic chemotherapy: Secondary | ICD-10-CM | POA: Diagnosis not present

## 2021-10-28 DIAGNOSIS — K089 Disorder of teeth and supporting structures, unspecified: Secondary | ICD-10-CM

## 2021-10-28 DIAGNOSIS — I1310 Hypertensive heart and chronic kidney disease without heart failure, with stage 1 through stage 4 chronic kidney disease, or unspecified chronic kidney disease: Secondary | ICD-10-CM | POA: Insufficient documentation

## 2021-10-28 DIAGNOSIS — C786 Secondary malignant neoplasm of retroperitoneum and peritoneum: Secondary | ICD-10-CM | POA: Insufficient documentation

## 2021-10-28 DIAGNOSIS — D539 Nutritional anemia, unspecified: Secondary | ICD-10-CM

## 2021-10-28 LAB — CMP (CANCER CENTER ONLY)
ALT: 21 U/L (ref 0–44)
AST: 19 U/L (ref 15–41)
Albumin: 3.8 g/dL (ref 3.5–5.0)
Alkaline Phosphatase: 111 U/L (ref 38–126)
Anion gap: 7 (ref 5–15)
BUN: 18 mg/dL (ref 8–23)
CO2: 30 mmol/L (ref 22–32)
Calcium: 9.9 mg/dL (ref 8.9–10.3)
Chloride: 99 mmol/L (ref 98–111)
Creatinine: 1.14 mg/dL — ABNORMAL HIGH (ref 0.44–1.00)
GFR, Estimated: 49 mL/min — ABNORMAL LOW (ref >60)
Glucose, Bld: 114 mg/dL — ABNORMAL HIGH (ref 70–99)
Potassium: 4.3 mmol/L (ref 3.5–5.1)
Sodium: 136 mmol/L (ref 135–145)
Total Bilirubin: 0.3 mg/dL (ref 0.3–1.2)
Total Protein: 7 g/dL (ref 6.5–8.1)

## 2021-10-28 LAB — CBC WITH DIFFERENTIAL (CANCER CENTER ONLY)
Abs Immature Granulocytes: 0 10*3/uL (ref 0.00–0.07)
Basophils Absolute: 0 10*3/uL (ref 0.0–0.1)
Basophils Relative: 0 %
Eosinophils Absolute: 0 10*3/uL (ref 0.0–0.5)
Eosinophils Relative: 1 %
HCT: 27.6 % — ABNORMAL LOW (ref 36.0–46.0)
Hemoglobin: 9.1 g/dL — ABNORMAL LOW (ref 12.0–15.0)
Immature Granulocytes: 0 %
Lymphocytes Relative: 33 %
Lymphs Abs: 1 10*3/uL (ref 0.7–4.0)
MCH: 37.8 pg — ABNORMAL HIGH (ref 26.0–34.0)
MCHC: 33 g/dL (ref 30.0–36.0)
MCV: 114.5 fL — ABNORMAL HIGH (ref 80.0–100.0)
Monocytes Absolute: 0.5 10*3/uL (ref 0.1–1.0)
Monocytes Relative: 16 %
Neutro Abs: 1.4 10*3/uL — ABNORMAL LOW (ref 1.7–7.7)
Neutrophils Relative %: 50 %
Platelet Count: 125 10*3/uL — ABNORMAL LOW (ref 150–400)
RBC: 2.41 MIL/uL — ABNORMAL LOW (ref 3.87–5.11)
RDW: 14.7 % (ref 11.5–15.5)
WBC Count: 2.9 10*3/uL — ABNORMAL LOW (ref 4.0–10.5)
nRBC: 0.7 % — ABNORMAL HIGH (ref 0.0–0.2)

## 2021-10-28 LAB — SAMPLE TO BLOOD BANK

## 2021-10-28 MED ORDER — PALONOSETRON HCL INJECTION 0.25 MG/5ML
0.2500 mg | Freq: Once | INTRAVENOUS | Status: AC
  Administered 2021-10-28: 0.25 mg via INTRAVENOUS
  Filled 2021-10-28: qty 5

## 2021-10-28 MED ORDER — FAMOTIDINE IN NACL 20-0.9 MG/50ML-% IV SOLN
20.0000 mg | Freq: Once | INTRAVENOUS | Status: AC
  Administered 2021-10-28: 20 mg via INTRAVENOUS
  Filled 2021-10-28: qty 50

## 2021-10-28 MED ORDER — AMOXICILLIN 500 MG PO TABS: 500.0000 mg | ORAL_TABLET | Freq: Two times a day (BID) | ORAL | 0 refills | Status: DC

## 2021-10-28 MED ORDER — HEPARIN SOD (PORK) LOCK FLUSH 100 UNIT/ML IV SOLN
500.0000 [IU] | Freq: Once | INTRAVENOUS | Status: AC | PRN
  Administered 2021-10-28: 500 [IU]

## 2021-10-28 MED ORDER — SODIUM CHLORIDE 0.9 % IV SOLN
150.0000 mg | Freq: Once | INTRAVENOUS | Status: AC
  Administered 2021-10-28: 150 mg via INTRAVENOUS
  Filled 2021-10-28: qty 150

## 2021-10-28 MED ORDER — SODIUM CHLORIDE 0.9% FLUSH
10.0000 mL | Freq: Once | INTRAVENOUS | Status: AC
  Administered 2021-10-28: 10 mL

## 2021-10-28 MED ORDER — SODIUM CHLORIDE 0.9 % IV SOLN
264.6000 mg | Freq: Once | INTRAVENOUS | Status: AC
  Administered 2021-10-28: 260 mg via INTRAVENOUS
  Filled 2021-10-28: qty 26

## 2021-10-28 MED ORDER — DOXORUBICIN HCL LIPOSOMAL CHEMO INJECTION 2 MG/ML
23.5000 mg/m2 | Freq: Once | INTRAVENOUS | Status: AC
  Administered 2021-10-28: 50 mg via INTRAVENOUS
  Filled 2021-10-28: qty 25

## 2021-10-28 MED ORDER — SODIUM CHLORIDE 0.9% FLUSH
10.0000 mL | INTRAVENOUS | Status: DC | PRN
  Administered 2021-10-28: 10 mL

## 2021-10-28 MED ORDER — DIPHENHYDRAMINE HCL 50 MG/ML IJ SOLN
25.0000 mg | Freq: Once | INTRAMUSCULAR | Status: AC
  Administered 2021-10-28: 25 mg via INTRAVENOUS
  Filled 2021-10-28: qty 1

## 2021-10-28 MED ORDER — SODIUM CHLORIDE 0.9 % IV SOLN
10.0000 mg | Freq: Once | INTRAVENOUS | Status: AC
  Administered 2021-10-28: 10 mg via INTRAVENOUS
  Filled 2021-10-28: qty 10

## 2021-10-28 MED ORDER — DEXTROSE 5 % IV SOLN: Freq: Once | INTRAVENOUS | Status: AC

## 2021-10-28 NOTE — Assessment & Plan Note (Signed)
She had minimal bleeding ?She will continue anticoagulation therapy indefinitely ?The patient is given instruction to hold anticoagulation therapy for 2 days prior to dental extraction and to resume the day after ?There is no contraindication to remain on antiplatelet agents or anticoagulants as long as the platelet is greater than 50,000. ? ? ?

## 2021-10-28 NOTE — Progress Notes (Signed)
[facility] ?OFFICE PROGRESS NOTE ? ?Patient Care Team: ?Caryl Bis, MD as PCP - General (Family Medicine) ?Awanda Mink Craige Cotta, RN as Oncology Nurse Navigator (Oncology) ? 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[place]) ?Her recent CT imaging in March showed positive response to treatment ?This is at the expense of severe pancytopenia with history of infection ?Moving forward, we discussed the risk and benefits of continuing treatment with combination of carboplatin and Doxil, continuing treatment with Doxil only I will take a break from treatment ?After much discussion, she is in agreement to proceed with combination of carboplatin and Doxil ?She is made aware about risk of cardiomyopathy with Doxil and risk of worsening pancytopenia and allergic reaction with carboplatin ?Echocardiogram report was reviewed, without signs of cardiomyopathy ?We will proceed with treatment today without delay ?I received a letter from her dentist regarding plan for dental extraction in the near future.  I will write a letter to her dentist, advising both the patient and her dentist to schedule her dental extraction around June 2 or 5 with antibiotic coverage.  She will return here on June 12 for final cycle of treatment ?I plan to repeat imaging study again in July for further follow-up ? ?Pancytopenia, acquired ([place]) ?She has pancytopenia due to recent treatment ?The patient is given a copy of her CBC ?She does not need blood transfusion support today ?There is no contraindication to remain on antiplatelet agents or anticoagulants as long as the platelet is greater than 50,000. ? ? ?PE (pulmonary thromboembolism) ([place]) ?She had minimal bleeding ?She will continue anticoagulation therapy indefinitely ?The patient is given instruction to hold anticoagulation therapy for 2 days prior to dental extraction and to resume the day after ?There is no contraindication to remain on antiplatelet agents or  anticoagulants as long as the platelet is greater than 50,000. ? ? ? ?CKD (chronic kidney disease), stage III ([place]) ?She has mild intermittent elevated serum creatinine ?We will observe closely and adjust the dose of carboplatin accordingly ? ?Poor dentition ?I sent a letter to her dentist for dental clearance to proceed with dental extraction next month ? ?No orders of the defined types were placed in this encounter. ? ? ?All questions were answered. The patient knows to call the clinic with any problems, questions or concerns. ?The total time spent in the appointment was 40 minutes encounter with patients including review of chart and various tests results, discussions about plan of care and coordination of care plan ?  ?[name], MD ?10/28/2021 11:34 AM ? ?INTERVAL HISTORY: ?Please see below for problem oriented charting. ?she returns for treatment follow-up seen prior to chemotherapy ?She had a tooth on the upper right side of her jaw that required dental extraction ?She denies fever or chills or pain ?Received letter from her dentist requesting dental clearance ?Denies nausea or changes in bowel habits with recent chemo ?The patient denies any recent signs or symptoms of bleeding such as spontaneous epistaxis, hematuria or hematochezia. ? ? ?REVIEW OF SYSTEMS:   ?Constitutional: Denies fevers, chills or abnormal weight loss ?Eyes: Denies blurriness of vision ?Ears, nose, mouth, throat, and face: Denies mucositis or sore throat ?Respiratory: Denies cough, dyspnea or wheezes ?Cardiovascular: Denies palpitation, chest discomfort or lower extremity swelling ?Gastrointestinal:  Denies nausea, heartburn or change in bowel habits ?Skin: Denies abnormal skin rashes ?Lymphatics: Denies new lymphadenopathy or easy bruising ?Neurological:Denies numbness, tingling or new weaknesses ?Behavioral/Psych: Mood is stable, no new changes  ?All other systems  were reviewed with the patient and are negative. ? ?I have reviewed the  past medical history, past surgical history, social history and family history with the patient and they are unchanged from previous note. ? ?ALLERGIES:  has No Known Allergies. ? ?MEDICATIONS:  ?Current Outpatient Medications  ?Medication Sig Dispense Refill  ? amoxicillin (AMOXIL) 500 MG tablet Take 1 tablet (500 mg total) by mouth 2 (two) times daily. 10 tablet 0  ? acetaminophen (TYLENOL) 325 MG tablet Take 650 mg by mouth every 6 (six) hours as needed for moderate pain or headache.     ? apixaban (ELIQUIS) 5 MG TABS tablet Take 1 tablet (5 mg total) by mouth 2 (two) times daily. 60 tablet 3  ? atenolol (TENORMIN) 25 MG tablet TAKE ONE (1) TABLET BY MOUTH EVERY DAY 30 tablet 3  ? Docusate Sodium 100 MG capsule Take 100 mg by mouth 2 (two) times daily as needed for constipation.    ? fexofenadine (ALLEGRA) 180 MG tablet Take 180 mg by mouth daily as needed for allergies or rhinitis.    ? levothyroxine (SYNTHROID) 75 MCG tablet Take 1 tablet (75 mcg total) by mouth daily before breakfast. 30 tablet 3  ? lidocaine-prilocaine (EMLA) cream Apply 1 application topically daily as needed (port access). 30 g 3  ? Melatonin 3 MG TABS Take 3 mg by mouth at bedtime as needed (sleep).    ? nystatin (MYCOSTATIN) 100000 UNIT/ML suspension Take 5 mLs (500,000 Units total) by mouth 4 (four) times daily. Take 5 mls by mouth 4 times daily, swish and spit. 60 mL 0  ? ondansetron (ZOFRAN) 8 MG tablet Take 1 tablet (8 mg total) by mouth 2 (two) times daily as needed (Nausea or vomiting). 30 tablet 1  ? polyethylene glycol (MIRALAX / GLYCOLAX) 17 g packet Take 17 g by mouth daily. 14 each 0  ? prochlorperazine (COMPAZINE) 10 MG tablet Take 1 tablet (10 mg total) by mouth every 6 (six) hours as needed (Nausea or vomiting). 30 tablet 1  ? senna-docusate (SENOKOT-S) 8.6-50 MG tablet Take 2 tablets by mouth at bedtime. (Patient taking differently: Take 2 tablets by mouth at bedtime as needed for mild constipation.) 60 tablet 3  ? ?No  current facility-administered medications for this visit.  ? ?Facility-Administered Medications Ordered in Other Visits  ?Medication Dose Route Frequency Provider Last Rate Last Admin  ? sodium chloride flush (NS) 0.9 % injection 10 mL  10 mL Intracatheter PRN [name], MD   10 mL at 08/19/21 1506  ? ? ?SUMMARY OF ONCOLOGIC HISTORY: ?Oncology History Overview Note  ?High grade serous ?Neg genetics ?Intolerance to niraparib ?Progressed on Taxol ?  ?Right ovarian epithelial cancer ([employer])  ?10/24/2018 Initial Diagnosis  ? Her symptoms began in April/May, 2020.  ?She has bloating and early satiety. Shortness of breath with walking. She denied bleeding. She reported constipation with pain with defecation and narrowed stools ? ?  ?11/29/2018 Imaging  ? 1. 13 cm complex cystic lesion in the central pelvis, highly suspicious for ovarian cystadenocarcinoma. ?2. Diffuse peritoneal carcinomatosis with mild ascites. ?3. Mild lymphadenopathy in porta hepatis and right cardiophrenic angle, suspicious for metastatic disease. ?4. Moderate right and tiny left pleural effusions ? ?  ?12/05/2018 Tumor Marker  ? Patient's tumor was tested for the following markers: CA-125 ?Results of the tumor marker test revealed 1015. ? ?  ?12/06/2018 Cancer Staging  ? Staging form: Ovary, Fallopian Tube, and Primary Peritoneal Carcinoma, AJCC 8th Edition ?- Clinical: FIGO Stage  IVA, calculated as Stage IV (cT3c, cN1, cM1) - Signed by [name], MD on 12/06/2018 ? ?  ?12/09/2018 Pathology Results  ? PLEURAL FLUID, RIGHT (SPECIMEN 1 OF 1 COLLECTED 12/09/18): ?- MALIGNANT CELLS CONSISTENT WITH METASTATIC ADENOCARCINOMA ?- SEE COMMENT ?Comment ?The neoplastic cells are positive for cytokeratin 7 and Pax-8 but negative for cytokeratin 20, TTF-1, CDX-2 and Gata-3. Overall, the phenotype is consistent with the clinical impression of gynecologic primary.  ?  ?12/15/2018 Procedure  ? Placement of single lumen port a cath via right internal jugular vein. The catheter  tip lies at the cavo-atrial junction. A power injectable port a cath was placed and is ready for immediate use ?  ?12/16/2018 - 08/24/2019 Chemotherapy  ? The patient had carboplatin and taxol for chemotherapy treatment

## 2021-10-28 NOTE — Patient Instructions (Signed)
[facility] MEDICAL [facility]COLOG[facility]  Discharge Instructions: Thank you for choosing [facility] to provide your oncology and hematology care.   If you have a lab appointment with the Cancer Center, please go directly to the Cancer Center and check in at the registration area.   Wear comfortable clothing and clothing appropriate for easy access to any Portacath or PICC line.   We strive to give you quality time with your provider. You may need to reschedule your appointment if you arrive late (15 or more minutes).  Arriving late affects you and other patients whose appointments are after yours.  Also, if you miss three or more appointments without notifying the office, you may be dismissed from the clinic at the provider's discretion.      For prescription refill requests, have your pharmacy contact our office and allow 72 hours for refills to be completed.    Today you received the following chemotherapy and/or immunotherapy agents:  Doxil & Carboplatin    To help prevent nausea and vomiting after your treatment, we encourage you to take your nausea medication as directed.  BELOW ARE SYMPTOMS THAT SHOULD BE REPORTED IMMEDIATELY: . *FEVER GREATER THAN 100.4 F (38 C) OR HIGHER . *CHILLS OR SWEATING . *NAUSEA AND VOMITING THAT IS NOT CONTROLLED WITH YOUR NAUSEA MEDICATION . *UNUSUAL SHORTNESS OF BREATH . *UNUSUAL BRUISING OR BLEEDING . *URINARY PROBLEMS (pain or burning when urinating, or frequent urination) . *BOWEL PROBLEMS (unusual diarrhea, constipation, pain near the anus) . TENDERNESS IN MOUTH AND THROAT WITH OR WITHOUT PRESENCE OF ULCERS (sore throat, sores in mouth, or a toothache) . UNUSUAL RASH, SWELLING OR PAIN  . UNUSUAL VAGINAL DISCHARGE OR ITCHING   Items with * indicate a potential emergency and should be followed up as soon as possible or go to the Emergency Department if any problems should occur.  Please show the CHEMOTHERAPY ALERT CARD or  IMMUNOTHERAPY ALERT CARD at check-in to the Emergency Department and triage nurse.  Should you have questions after your visit or need to cancel or reschedule your appointment, please contact [facility]  Dept: [phone]  and follow the prompts.  Office hours are 8:00 a.m. to 4:30 p.m. Monday - Friday. Please note that voicemails left after 4:00 p.m. may not be returned until the following business day.  We are closed weekends and major holidays. You have access to a nurse at all times for urgent questions. Please call the main number to the clinic Dept: [phone] and follow the prompts.   For any non-urgent questions, you may also contact your provider using MyChart. We now offer e-Visits for anyone 18 and older to request care online for non-urgent symptoms. For details visit [email or web address].   Also download the MyChart app! Go to the app store, search "MyChart", open the app, select [facility], and log in with your MyChart username and password.  Due to Covid, a mask is required upon entering the hospital/clinic. If you do not have a mask, one will be given to you upon arrival. For doctor visits, patients may have 1 support person aged 18 or older with them. For treatment visits, patients cannot have anyone with them due to current Covid guidelines and our immunocompromised population.   

## 2021-10-28 NOTE — Assessment & Plan Note (Signed)
She has pancytopenia due to recent treatment ?The patient is given a copy of her CBC ?She does not need blood transfusion support today ?There is no contraindication to remain on antiplatelet agents or anticoagulants as long as the platelet is greater than 50,000. ? ?

## 2021-10-28 NOTE — Assessment & Plan Note (Signed)
She has mild intermittent elevated serum creatinine ?We will observe closely and adjust the dose of carboplatin accordingly ?

## 2021-10-28 NOTE — Assessment & Plan Note (Signed)
Her recent CT imaging in March showed positive response to treatment ?This is at the expense of severe pancytopenia with history of infection ?Moving forward, we discussed the risk and benefits of continuing treatment with combination of carboplatin and Doxil, continuing treatment with Doxil only I will take a break from treatment ?After much discussion, she is in agreement to proceed with combination of carboplatin and Doxil ?She is made aware about risk of cardiomyopathy with Doxil and risk of worsening pancytopenia and allergic reaction with carboplatin ?Echocardiogram report was reviewed, without signs of cardiomyopathy ?We will proceed with treatment today without delay ?I received a letter from her dentist regarding plan for dental extraction in the near future.  I will write a letter to her dentist, advising both the patient and her dentist to schedule her dental extraction around June 2 or 5 with antibiotic coverage.  She will return here on June 12 for final cycle of treatment ?I plan to repeat imaging study again in July for further follow-up ?

## 2021-10-28 NOTE — Assessment & Plan Note (Signed)
I sent a letter to her dentist for dental clearance to proceed with dental extraction next month ?

## 2021-10-29 DIAGNOSIS — M533 Sacrococcygeal disorders, not elsewhere classified: Secondary | ICD-10-CM | POA: Diagnosis not present

## 2021-11-06 ENCOUNTER — Other Ambulatory Visit: Payer: Self-pay

## 2021-11-06 ENCOUNTER — Inpatient Hospital Stay (HOSPITAL_COMMUNITY)
Admission: EM | Admit: 2021-11-06 | Discharge: 2021-11-10 | DRG: 374 | Disposition: A | Discharge status: Home / Self Care | Payer: Medicare Other | Attending: Internal Medicine | Admitting: Internal Medicine

## 2021-11-06 ENCOUNTER — Emergency Department (HOSPITAL_COMMUNITY): Payer: Medicare Other

## 2021-11-06 ENCOUNTER — Encounter (HOSPITAL_COMMUNITY): Payer: Self-pay

## 2021-11-06 DIAGNOSIS — Z6834 Body mass index (BMI) 34.0-34.9, adult: Secondary | ICD-10-CM

## 2021-11-06 DIAGNOSIS — Z66 Do not resuscitate: Secondary | ICD-10-CM | POA: Diagnosis not present

## 2021-11-06 DIAGNOSIS — Z8673 Personal history of transient ischemic attack (TIA), and cerebral infarction without residual deficits: Secondary | ICD-10-CM | POA: Diagnosis not present

## 2021-11-06 DIAGNOSIS — Z86718 Personal history of other venous thrombosis and embolism: Secondary | ICD-10-CM

## 2021-11-06 DIAGNOSIS — D61818 Other pancytopenia: Secondary | ICD-10-CM | POA: Diagnosis present

## 2021-11-06 DIAGNOSIS — N183 Chronic kidney disease, stage 3 unspecified: Secondary | ICD-10-CM | POA: Diagnosis not present

## 2021-11-06 DIAGNOSIS — I10 Essential (primary) hypertension: Secondary | ICD-10-CM | POA: Diagnosis not present

## 2021-11-06 DIAGNOSIS — I129 Hypertensive chronic kidney disease with stage 1 through stage 4 chronic kidney disease, or unspecified chronic kidney disease: Secondary | ICD-10-CM | POA: Diagnosis not present

## 2021-11-06 DIAGNOSIS — L0390 Cellulitis, unspecified: Secondary | ICD-10-CM | POA: Diagnosis present

## 2021-11-06 DIAGNOSIS — K219 Gastro-esophageal reflux disease without esophagitis: Secondary | ICD-10-CM | POA: Diagnosis present

## 2021-11-06 DIAGNOSIS — Z7901 Long term (current) use of anticoagulants: Secondary | ICD-10-CM

## 2021-11-06 DIAGNOSIS — C786 Secondary malignant neoplasm of retroperitoneum and peritoneum: Secondary | ICD-10-CM | POA: Diagnosis not present

## 2021-11-06 DIAGNOSIS — Z800 Family history of malignant neoplasm of digestive organs: Secondary | ICD-10-CM

## 2021-11-06 DIAGNOSIS — E669 Obesity, unspecified: Secondary | ICD-10-CM | POA: Diagnosis present

## 2021-11-06 DIAGNOSIS — Z79899 Other long term (current) drug therapy: Secondary | ICD-10-CM

## 2021-11-06 DIAGNOSIS — Z79890 Hormone replacement therapy (postmenopausal): Secondary | ICD-10-CM

## 2021-11-06 DIAGNOSIS — C561 Malignant neoplasm of right ovary: Secondary | ICD-10-CM | POA: Diagnosis present

## 2021-11-06 DIAGNOSIS — R1110 Vomiting, unspecified: Secondary | ICD-10-CM | POA: Diagnosis not present

## 2021-11-06 DIAGNOSIS — M160 Bilateral primary osteoarthritis of hip: Secondary | ICD-10-CM | POA: Diagnosis not present

## 2021-11-06 DIAGNOSIS — T451X5A Adverse effect of antineoplastic and immunosuppressive drugs, initial encounter: Secondary | ICD-10-CM | POA: Diagnosis present

## 2021-11-06 DIAGNOSIS — I2699 Other pulmonary embolism without acute cor pulmonale: Secondary | ICD-10-CM | POA: Diagnosis present

## 2021-11-06 DIAGNOSIS — K56609 Unspecified intestinal obstruction, unspecified as to partial versus complete obstruction: Secondary | ICD-10-CM | POA: Diagnosis not present

## 2021-11-06 DIAGNOSIS — Z86711 Personal history of pulmonary embolism: Secondary | ICD-10-CM | POA: Diagnosis not present

## 2021-11-06 DIAGNOSIS — D539 Nutritional anemia, unspecified: Secondary | ICD-10-CM | POA: Diagnosis not present

## 2021-11-06 DIAGNOSIS — E039 Hypothyroidism, unspecified: Secondary | ICD-10-CM | POA: Diagnosis present

## 2021-11-06 DIAGNOSIS — N1832 Chronic kidney disease, stage 3b: Secondary | ICD-10-CM | POA: Diagnosis not present

## 2021-11-06 DIAGNOSIS — D61810 Antineoplastic chemotherapy induced pancytopenia: Secondary | ICD-10-CM | POA: Diagnosis not present

## 2021-11-06 DIAGNOSIS — R109 Unspecified abdominal pain: Secondary | ICD-10-CM | POA: Diagnosis not present

## 2021-11-06 DIAGNOSIS — R1013 Epigastric pain: Secondary | ICD-10-CM | POA: Diagnosis not present

## 2021-11-06 DIAGNOSIS — K5669 Other partial intestinal obstruction: Secondary | ICD-10-CM | POA: Diagnosis not present

## 2021-11-06 LAB — CBC WITH DIFFERENTIAL/PLATELET
Abs Immature Granulocytes: 0.01 10*3/uL (ref 0.00–0.07)
Basophils Absolute: 0 10*3/uL (ref 0.0–0.1)
Basophils Relative: 0 %
Eosinophils Absolute: 0 10*3/uL (ref 0.0–0.5)
Eosinophils Relative: 0 %
HCT: 27.9 % — ABNORMAL LOW (ref 36.0–46.0)
Hemoglobin: 9.3 g/dL — ABNORMAL LOW (ref 12.0–15.0)
Immature Granulocytes: 0 %
Lymphocytes Relative: 23 %
Lymphs Abs: 0.9 10*3/uL (ref 0.7–4.0)
MCH: 38.3 pg — ABNORMAL HIGH (ref 26.0–34.0)
MCHC: 33.3 g/dL (ref 30.0–36.0)
MCV: 114.8 fL — ABNORMAL HIGH (ref 80.0–100.0)
Monocytes Absolute: 0.4 10*3/uL (ref 0.1–1.0)
Monocytes Relative: 10 %
Neutro Abs: 2.7 10*3/uL (ref 1.7–7.7)
Neutrophils Relative %: 67 %
Platelets: 124 10*3/uL — ABNORMAL LOW (ref 150–400)
RBC: 2.43 MIL/uL — ABNORMAL LOW (ref 3.87–5.11)
RDW: 14.8 % (ref 11.5–15.5)
WBC: 4.1 10*3/uL (ref 4.0–10.5)
nRBC: 0 % (ref 0.0–0.2)

## 2021-11-06 LAB — URINALYSIS, ROUTINE W REFLEX MICROSCOPIC
Bilirubin Urine: NEGATIVE
Glucose, UA: NEGATIVE mg/dL
Hgb urine dipstick: NEGATIVE
Ketones, ur: NEGATIVE mg/dL
Leukocytes,Ua: NEGATIVE
Nitrite: NEGATIVE
Protein, ur: NEGATIVE mg/dL
Specific Gravity, Urine: 1.009 (ref 1.005–1.030)
pH: 7 (ref 5.0–8.0)

## 2021-11-06 LAB — COMPREHENSIVE METABOLIC PANEL
ALT: 16 U/L (ref 0–44)
AST: 18 U/L (ref 15–41)
Albumin: 3.4 g/dL — ABNORMAL LOW (ref 3.5–5.0)
Alkaline Phosphatase: 91 U/L (ref 38–126)
Anion gap: 10 (ref 5–15)
BUN: 15 mg/dL (ref 8–23)
CO2: 25 mmol/L (ref 22–32)
Calcium: 9.5 mg/dL (ref 8.9–10.3)
Chloride: 103 mmol/L (ref 98–111)
Creatinine, Ser: 0.82 mg/dL (ref 0.44–1.00)
GFR, Estimated: >60 mL/min (ref >60)
Glucose, Bld: 111 mg/dL — ABNORMAL HIGH (ref 70–99)
Potassium: 3.8 mmol/L (ref 3.5–5.1)
Sodium: 138 mmol/L (ref 135–145)
Total Bilirubin: 0.7 mg/dL (ref 0.3–1.2)
Total Protein: 6.6 g/dL (ref 6.5–8.1)

## 2021-11-06 LAB — HEPARIN LEVEL (UNFRACTIONATED): Heparin Unfractionated: 0.28 IU/mL — ABNORMAL LOW (ref 0.30–0.70)

## 2021-11-06 LAB — PROTIME-INR
INR: 1.1 (ref 0.8–1.2)
Prothrombin Time: 13.7 seconds (ref 11.4–15.2)

## 2021-11-06 LAB — APTT: aPTT: 30 seconds (ref 24–36)

## 2021-11-06 MED ORDER — ONDANSETRON HCL 4 MG/2ML IJ SOLN
4.0000 mg | Freq: Once | INTRAMUSCULAR | Status: AC
  Administered 2021-11-06: 4 mg via INTRAVENOUS
  Filled 2021-11-06: qty 2

## 2021-11-06 MED ORDER — DIATRIZOATE MEGLUMINE & SODIUM 66-10 % PO SOLN: 90.0000 mL | Freq: Once | ORAL | Status: DC

## 2021-11-06 MED ORDER — HYDRALAZINE HCL 20 MG/ML IJ SOLN: 10.0000 mg | Freq: Three times a day (TID) | INTRAMUSCULAR | Status: DC | PRN

## 2021-11-06 MED ORDER — IOHEXOL 300 MG/ML  SOLN
100.0000 mL | Freq: Once | INTRAMUSCULAR | Status: AC | PRN
  Administered 2021-11-06: 100 mL via INTRAVENOUS

## 2021-11-06 MED ORDER — PROCHLORPERAZINE EDISYLATE 10 MG/2ML IJ SOLN: 10.0000 mg | Freq: Four times a day (QID) | INTRAMUSCULAR | Status: DC | PRN

## 2021-11-06 MED ORDER — HEPARIN (PORCINE) 25000 UT/250ML-% IV SOLN: 1250.0000 [IU]/h | INTRAVENOUS | Status: DC

## 2021-11-06 MED ORDER — MORPHINE SULFATE (PF) 4 MG/ML IV SOLN
4.0000 mg | Freq: Once | INTRAVENOUS | Status: AC
  Administered 2021-11-06: 4 mg via INTRAVENOUS
  Filled 2021-11-06: qty 1

## 2021-11-06 MED ORDER — SODIUM CHLORIDE (PF) 0.9 % IJ SOLN
INTRAMUSCULAR | Status: AC
  Filled 2021-11-06: qty 50

## 2021-11-06 MED ORDER — MORPHINE SULFATE (PF) 2 MG/ML IV SOLN: 2.0000 mg | INTRAVENOUS | Status: DC | PRN

## 2021-11-06 MED ORDER — SODIUM CHLORIDE 0.9 % IV SOLN: INTRAVENOUS | Status: DC

## 2021-11-06 MED ORDER — SODIUM CHLORIDE 0.9 % IV BOLUS
1000.0000 mL | Freq: Once | INTRAVENOUS | Status: AC
  Administered 2021-11-06: 1000 mL via INTRAVENOUS

## 2021-11-06 MED ORDER — ENOXAPARIN SODIUM 100 MG/ML IJ SOSY
100.0000 mg | PREFILLED_SYRINGE | Freq: Two times a day (BID) | INTRAMUSCULAR | Status: DC
  Administered 2021-11-06 – 2021-11-07 (×2): 100 mg via SUBCUTANEOUS
  Filled 2021-11-06 (×2): qty 1

## 2021-11-06 NOTE — ED Notes (Signed)
While discussing NG placement pt's daughter reports that pt has swallowing difficulties. While evaluating nares pt reports she has a tooth on the right lower that is, "Crumbling."  Per pt and daughter, pt's oncologist was concerned about tooth extraction d/t possibility of hemorrhage. With all of these considerations it is out of this writers scope to safely preform NG tube placement. D/w Precious Bard, CN who agrees with the above.   ?

## 2021-11-06 NOTE — Progress Notes (Signed)
ANTICOAGULATION CONSULT NOTE - Initial Consult ? ?Pharmacy Consult for Lovenox ?Indication: h/o VTE ? ?No Known Allergies ? ?Patient Measurements: ?Height: '5\' 7"'$  (170.2 cm) ?Weight: 98.8 kg (217 lb 13 oz) ?IBW/kg (Calculated) : 61.6 ? ?Vital Signs: ?Temp: 97.9 ?F (36.6 ?C) (05/11 1721) ?Temp Source: Oral (05/11 1721) ?BP: 151/48 (05/11 1721) ?Pulse Rate: 70 (05/11 1721) ? ?Labs: ?Recent Labs  ?  11/06/21 ?0612 11/06/21 ?2836  ?HGB 9.3*  --   ?HCT 27.9*  --   ?PLT 124*  --   ?APTT  --  30  ?LABPROT  --  13.7  ?INR  --  1.1  ?CREATININE 0.82  --   ? ? ?Estimated Creatinine Clearance: 68.3 mL/min (by C-G formula based on SCr of 0.82 mg/dL). ? ? ?Medical History: ?Past Medical History:  ?Diagnosis Date  ? Back pain   ? DVT (deep venous thrombosis) ([place]) 02/02/2019  ? had DVT first that moved into lung  ? GERD (gastroesophageal reflux disease)   ? History of pulmonary embolus (PE) 02/02/2019  ? confirmed by CT chest- had DVT first that moved into lungs per patient  ? Hypertension   ? Ovarian cancer ([place])   ? Pessary maintenance   ? Stroke [facility])   ? ? ?Assessment: ?Active Problem(s): abdominal pain, SBO ? ?PMH: ovarian cancer (last chemo 10/28/21), DVT/PE on eliquis, HTN, hypothyroidism, GERD, stroke ? ?AC/Heme: Hx PE/DVT on indefinite anticoagulation ?PTA apixaban 5 BD >> 5/11 Lovenox ?CBC:  Hgb 9.3, Plt 124 ? ?Goal of Therapy:  ?Therapeutic anticoagulation  ?Monitor platelets by anticoagulation protocol: Yes ?  ?Plan:  ?Lovenox '80mg'$  q12 hrs while NPO,  ?Holding apixaban  ?CBC q72h while on LMWH ? ? ?Mairlyn Tegtmeyer S. Alford Highland, PharmD, BCPS ?Clinical Staff Pharmacist [email or web address] ?Alford Highland, [employer] ?11/06/2021,6:03 PM ? ? ?

## 2021-11-06 NOTE — Consult Note (Addendum)
?[facility] ?Consult Note ? ?Paula Peterson ?[birth year]  ?[id number].   ? ?Requesting MD: Paula Peterson ?Chief Complaint/Reason for Consult: SBO ? ?HPI:  ?Paula Peterson is a 81yo female PMH PE/DVT on eliquis (last dose 5/10 in PM), and h/o Ovarian cancer s/p ex lap right salpingo-oophorectomy, omentectomy radical tumor debulking 06/06/2019 by Paula Peterson and currently undergoing chemotherapy (carboplatin + Doxil last dose 10/28/21), who presented to [facility] today complaining of gradual onset diffuse abdominal pain that began yesterday afternoon. Pain has been intermittent and severe at times. Worst in the lower abdomen. Associated with nausea and vomiting. Last BM was yesterday morning and normal. Denies fever, chills, diarrhea. States that she ate [place] food yesterday, but no one she ate with has similar symptoms. Thinks she passed a small amount of flatus today. She was admitted in 05/2021 with SBO that resolved conservatively. ?In the ED she was found to be hemodynamically stable. CT scan shows distended fluid-filled small bowel in the pelvis with fecalization; no discrete transition zone; imaging compatible with small bowel stricture or evolving in ?this patient with a reported history small bowel obstruction.  ?General surgery asked to see. ? ? ?Review of Systems  ?Constitutional:  Negative for fever.  ?Gastrointestinal:  Positive for abdominal pain, nausea and vomiting. Negative for diarrhea.  ? ? ?Family History  ?Problem Relation Age of Onset  ? Stomach cancer Sister   ?     dx 77s-50s  ? Dementia Mother   ? Cancer Sister   ?     unk type, hysterectomy, dx 71s  ? ? ?Past Medical History:  ?Diagnosis Date  ? Back pain   ? DVT (deep venous thrombosis) ([place]) 02/02/2019  ? had DVT first that moved into lung  ? GERD (gastroesophageal reflux disease)   ? History of pulmonary embolus (PE) 02/02/2019  ? confirmed by CT chest- had DVT first that moved into lungs per patient  ? Hypertension   ? Ovarian cancer  ([place])   ? Pessary maintenance   ? Stroke [facility])   ? ? ?Past Surgical History:  ?Procedure Laterality Date  ? ABDOMINAL HYSTERECTOMY    ? ovaries left  ? BACK SURGERY    ? DEBULKING Bilateral 06/06/2019  ? Procedure: RADICAL TUMOR DEBULKING;  Surgeon: Everitt Amber, MD;  Location: [facility]S;  Service: Gynecology;  Laterality: Bilateral;  ? FRACTURE SURGERY    ? IR IMAGING GUIDED PORT INSERTION  12/15/2018  ? OMENTECTOMY Bilateral 06/06/2019  ? Procedure: OMENTECTOMY;  Surgeon: Everitt Amber, MD;  Location: [facility];  Service: Gynecology;  Laterality: Bilateral;  ? SALPINGOOPHORECTOMY Bilateral 06/06/2019  ? Procedure: EXPLORATORY LAPAROTOMY,OPEN RIGHT SALPINGO OOPHORECTOMY;  Surgeon: Everitt Amber, MD;  Location: [facility];  Service: Gynecology;  Laterality: Bilateral;  ? ? ?Social History:  reports that she has never smoked. She has never used smokeless tobacco. She reports that she does not drink alcohol and does not use drugs. ? ?Allergies: No Known Allergies ? ?(Not in a hospital admission) ? ? ?Prior to Admission medications   ?Medication Sig Start Date End Date Taking? Authorizing Provider  ?acetaminophen (TYLENOL) 325 MG tablet Take 650 mg by mouth every 6 (six) hours as needed for moderate pain or headache.     [provider]  ?amoxicillin (AMOXIL) 500 MG tablet Take 1 tablet (500 mg total) by mouth 2 (two) times daily. 10/28/21   [name], MD  ?apixaban (ELIQUIS) 5 MG TABS tablet Take 1 tablet (5 mg total) by mouth 2 (two)  times daily. 10/01/21   [name], MD  ?atenolol (TENORMIN) 25 MG tablet TAKE ONE (1) TABLET BY MOUTH EVERY DAY 07/23/21   [name], MD  ?Docusate Sodium 100 MG capsule Take 100 mg by mouth 2 (two) times daily as needed for constipation.    [provider]  ?fexofenadine (ALLEGRA) 180 MG tablet Take 180 mg by mouth daily as needed for allergies or rhinitis.    [provider]  ?levothyroxine (SYNTHROID) 75 MCG tablet Take 1 tablet (75 mcg total) by mouth daily before breakfast.  07/18/20   Roxan Hockey, MD  ?lidocaine-prilocaine (EMLA) cream Apply 1 application topically daily as needed (port access). 07/22/21   [name], MD  ?Melatonin 3 MG TABS Take 3 mg by mouth at bedtime as needed (sleep).    [provider]  ?nystatin (MYCOSTATIN) 100000 UNIT/ML suspension Take 5 mLs (500,000 Units total) by mouth 4 (four) times daily. Take 5 mls by mouth 4 times daily, swish and spit. 10/13/21   [name], MD  ?ondansetron (ZOFRAN) 8 MG tablet Take 1 tablet (8 mg total) by mouth 2 (two) times daily as needed (Nausea or vomiting). 12/20/20   [name], MD  ?polyethylene glycol (MIRALAX / GLYCOLAX) 17 g packet Take 17 g by mouth daily. 07/02/21   Georgette Shell, MD  ?prochlorperazine (COMPAZINE) 10 MG tablet Take 1 tablet (10 mg total) by mouth every 6 (six) hours as needed (Nausea or vomiting). 12/20/20   [name], MD  ?senna-docusate (SENOKOT-S) 8.6-50 MG tablet Take 2 tablets by mouth at bedtime. ?Patient taking differently: Take 2 tablets by mouth at bedtime as needed for mild constipation. 07/17/20   Roxan Hockey, MD  ? ? ?Blood pressure (!) 119/59, pulse 66, temperature 98.1 ?F (36.7 ?C), temperature source Oral, resp. rate 12, SpO2 97 %. ?Physical Exam: ?General: pleasant, WD/WN elderly female who is laying in bed in NAD ?HEENT: head is normocephalic, atraumatic.  Sclera are noninjected.  Pupils equal and round.  Ears and nose without any masses or lesions.  Mouth is pink and moist. Dentition fair ?Heart: regular, rate, and rhythm.  Normal s1,s2. No obvious murmurs, gallops, or rubs noted.  Palpable pedal pulses bilaterally  ?Lungs: CTAB, no wheezes, rhonchi, or rales noted.  Respiratory effort nonlabored ?Abd: soft, mild distension, no masses, hernias, or organomegaly. Mild lower abdominal TTP without rebound or guarding ?Skin: warm and dry with no masses, lesions, or rashes ?Neuro: MAEs ? ?Results for orders placed or performed during the hospital encounter of 11/06/21  (from the past 48 hour(s))  ?Comprehensive metabolic panel     Status: Abnormal  ? Collection Time: 11/06/21  6:12 AM  ?Result Value Ref Range  ? Sodium 138 135 - 145 mmol/L  ? Potassium 3.8 3.5 - 5.1 mmol/L  ? Chloride 103 98 - 111 mmol/L  ? CO2 25 22 - 32 mmol/L  ? Glucose, Bld 111 (H) 70 - 99 mg/dL  ?  Comment: Glucose reference range applies only to samples taken after fasting for at least 8 hours.  ? BUN 15 8 - 23 mg/dL  ? Creatinine, Ser 0.82 0.44 - 1.00 mg/dL  ? Calcium 9.5 8.9 - 10.3 mg/dL  ? Total Protein 6.6 6.5 - 8.1 g/dL  ? Albumin 3.4 (L) 3.5 - 5.0 g/dL  ? AST 18 15 - 41 U/L  ? ALT 16 0 - 44 U/L  ? Alkaline Phosphatase 91 38 - 126 U/L  ? Total Bilirubin 0.7 0.3 - 1.2 mg/dL  ?  GFR, Estimated >60 >60 mL/min  ?  Comment: (NOTE) ?Calculated using the CKD-EPI Creatinine Equation (2021) ?  ? Anion gap 10 5 - 15  ?  Comment: Performed at [facility], [place] [address] [place], [place] [address]  ?CBC with Diff     Status: Abnormal  ? Collection Time: 11/06/21  6:12 AM  ?Result Value Ref Range  ? WBC 4.1 4.0 - 10.5 K/uL  ? RBC 2.43 (L) 3.87 - 5.11 MIL/uL  ? Hemoglobin 9.3 (L) 12.0 - 15.0 g/dL  ? HCT 27.9 (L) 36.0 - 46.0 %  ? MCV 114.8 (H) 80.0 - 100.0 fL  ? MCH 38.3 (H) 26.0 - 34.0 pg  ? MCHC 33.3 30.0 - 36.0 g/dL  ? RDW 14.8 11.5 - 15.5 %  ? Platelets 124 (L) 150 - 400 K/uL  ? nRBC 0.0 0.0 - 0.2 %  ? Neutrophils Relative % 67 %  ? Neutro Abs 2.7 1.7 - 7.7 K/uL  ? Lymphocytes Relative 23 %  ? Lymphs Abs 0.9 0.7 - 4.0 K/uL  ? Monocytes Relative 10 %  ? Monocytes Absolute 0.4 0.1 - 1.0 K/uL  ? Eosinophils Relative 0 %  ? Eosinophils Absolute 0.0 0.0 - 0.5 K/uL  ? Basophils Relative 0 %  ? Basophils Absolute 0.0 0.0 - 0.1 K/uL  ? Immature Granulocytes 0 %  ? Abs Immature Granulocytes 0.01 0.00 - 0.07 K/uL  ? Polychromasia PRESENT   ?  Comment: Performed at [facility], [place] [address], [place], [place] [address]  ?Urinalysis, Routine w reflex microscopic Urine, Clean Catch      Status: Abnormal  ? Collection Time: 11/06/21  6:12 AM  ?Result Value Ref Range  ? Color, Urine STRAW (A) YELLOW  ? APPearance CLEAR CLEAR  ? Specific Gravity, Urine 1.009 1.005 - 1.030  ? pH 7.0 5.0

## 2021-11-06 NOTE — Progress Notes (Addendum)
HEMATOLOGY-ONCOLOGY PROGRESS NOTE ? ?I saw the patient, examined her and agree with documentation as follows ? 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[place]) ?Her recent CT imaging in March showed positive response to treatment ?This is at the expense of severe pancytopenia with history of infection ?Moving forward, we discussed the risk and benefits of continuing treatment with combination of carboplatin and Doxil, continuing treatment with Doxil only I will take a break from treatment ?After much discussion, she is in agreement to proceed with combination of carboplatin and Doxil ?She is made aware about risk of cardiomyopathy with Doxil and risk of worsening pancytopenia and allergic reaction with carboplatin ?Echocardiogram report was reviewed, without signs of cardiomyopathy ?She received her last treatment on 10/28/2021 ?CT abdomen/pelvis performed this admission shows stable disease ?Plan to continue outpatient chemo ?Continue supportive care ? ?Abdominal pain, nausea, vomiting,?  Stricture versus SBO ?Abdominal pain and vomiting improved after receiving pain medication and antiemetics ?Has been seen by general surgery who is recommended NG tube placement and bowel rest ?NG tube currently on hold per patient request ?We do not feel strongly that NG tube needs to be placed if she is no longer having abdominal pain, nausea, vomiting ?I do not believe this is due to disease progression ?She had similar hospitalization earlier this year related to stricture that resolved conservatively ?Continue supportive care ?  ?Pancytopenia, acquired ([place]) ?She has pancytopenia due to recent treatment ?White blood cell count is normal ?Her hemoglobin is stable at 9.3 today and platelets are mildly low at 124,000 ?Continue to monitor ?There is no contraindication to remain on antiplatelet agents or anticoagulants as long as the platelet is greater than 50,000 ?  ?PE (pulmonary thromboembolism) ([place]) ?She had  minimal bleeding ?She will continue anticoagulation therapy indefinitely ?There is no contraindication to remain on antiplatelet agents or anticoagulants as long as the platelet is greater than 50,000. ?  ?CKD (chronic kidney disease), stage III ([place]) ?She has mild intermittent elevated serum creatinine ?We will observe closely and adjust the dose of carboplatin accordingly ?  ?Poor dentition ?I sent a letter to her dentist for dental clearance to proceed with dental extraction next month ? ?Discharge planning ?Continue bowel rest.  Will need control of her abdominal pain, nausea, vomiting before she can be considered for discharge. ?Anticipate that she will be in the hospital for at least 2 to 3 days. ?I will check on her again tomorrow I have addressed all questions and concerns ? ?Mikey Bussing, DNP, AGPCNP-BC, AOCNP ?[name], MD ? ?SUBJECTIVE:  ?Paula Peterson for ovarian cancer ?She is receiving systemic treatment with carboplatin and Doxil, last cycle 10/28/2021 ?Now presents to the emergency department with abdominal pain, nausea, vomiting ?Reports that she had [place] food yesterday and developed abdominal pain several hours after ?She subsequently developed some nausea and vomiting ?She thought it was related to the food that she ate ?CT abdomen/pelvis showed a mildly distended fluid-filled small bowel, no discrete transition zone evident and features are compatible with small bowel stricture ?The CT scan showed stable appearance of her right omental soft tissue lesion and scattered peritoneal and mesenteric nodules ?She was seen by general surgery who recommended medical management and placement of NG tube ?She was worried about due to progression difficulty last time she had one ?Her abdominal pain is improved after receiving pain medication ?She is no longer having any nausea or vomiting after receiving antiemetics ?Last bowel movement was yesterday ?Denies recent fevers  and  chills ? ?Oncology History Overview Note  ?High grade serous ?Neg genetics ?Intolerance to niraparib ?Progressed on Taxol ?  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 Her symptoms began in April/May, 2020.  ?She has bloating and early satiety. Shortness of breath with walking. She denied bleeding. She reported constipation with pain with defecation and narrowed stools ? ?  ?11/29/2018 Imaging  ? 1. 13 cm complex cystic lesion in the central pelvis, highly suspicious for ovarian cystadenocarcinoma. ?2. Diffuse peritoneal carcinomatosis with mild ascites. ?3. Mild lymphadenopathy in porta hepatis and right cardiophrenic angle, suspicious for metastatic disease. ?4. Moderate right and tiny left pleural effusions ? ?  ?12/05/2018 Tumor Marker  ? Patient's tumor was tested for the following markers: CA-125 ?Results of the tumor marker test revealed 1015. ? ?  ?12/06/2018 Cancer Staging  ? Staging form: Ovary, Fallopian Tube, and Primary Peritoneal Carcinoma, AJCC 8th Edition ?- Clinical: FIGO Stage IVA, calculated as Stage IV (cT3c, cN1, cM1) - Signed by [name], MD on 12/06/2018 ? ?  ?12/09/2018 Pathology Results  ? PLEURAL FLUID, RIGHT (SPECIMEN 1 OF 1 COLLECTED 12/09/18): ?- MALIGNANT CELLS CONSISTENT WITH METASTATIC ADENOCARCINOMA ?- SEE COMMENT ?Comment ?The neoplastic cells are positive for cytokeratin 7 and Pax-8 but negative for cytokeratin 20, TTF-1, CDX-2 and Gata-3. Overall, the phenotype is consistent with the clinical impression of gynecologic primary.  ?  ?12/15/2018 Procedure  ? Placement of single lumen port a cath via right internal jugular vein. The catheter tip lies at the cavo-atrial junction. A power injectable port a cath was placed and is ready for immediate use ?  ?12/16/2018 - 08/24/2019 Chemotherapy  ? The patient had carboplatin and taxol for chemotherapy treatment.  She had 7 cycles given neoadjuvant prior to surgery and 3 more cycles after surgery, for a total of 10  cycles of treatment ? ?  ?01/06/2019 Tumor Marker  ? Patient's tumor was tested for the following markers: CA-125 ?Results of the tumor marker test revealed 948 ?  ?02/02/2019 Imaging  ? 1. Massive pulmonary embolism, as discussed above. Given the mildly elevated RV to LV ratio of 0.95, this is associated with increased risk of morbidity and mortality. ?2. Today's study demonstrates a mixed response to therapy. Specifically, while there has been regression of the bulky intraperitoneal metastatic disease and regression of previously noted pleural effusions, the large cystic mass in the central pelvis has increased in size compared to the prior study. ?3. New right mild hydroureteronephrosis related to extrinsic compression on the distal third of the right ureter by the patient's large pelvic mass. ?4. Scattered small pulmonary nodules (predominantly pleural based) appear stable compared to prior examinations. These are nonspecific but warrant continued attention on follow-up studies. ?5. Aortic atherosclerosis, in addition to three-vessel coronary artery disease. Assessment for potential risk factor modification, dietary therapy or pharmacologic therapy may be warranted, if clinically indicated. ?  ?02/02/2019 - 02/04/2019 Hospital Admission  ? She was admitted the hospital due to DVT and PE ?  ?02/03/2019 Imaging  ? Bilateral venous Doppler US ?Right: Findings consistent with acute deep vein thrombosis involving the right femoral vein, right popliteal vein, right peroneal veins, right soleal veins, and right gastrocnemius veins. No cystic structure found in the popliteal fossa. ?Left: There is no evidence of deep vein thrombosis in the lower extremity. No cystic structure found in the popliteal fossa ?  ?02/17/2019 Tumor Marker  ? Patient's tumor was tested for the following markers: CA-125 ?Results  of the tumor marker test revealed 127 ?  ?03/10/2019 Tumor Marker  ? Patient's tumor was tested for the following markers:  CA-125. ?Results of the tumor marker test revealed 87.4 ?  ?03/14/2019 - 03/16/2019 Hospital Admission  ? She was admitted to the hospital recently for weakness ?  ?03/31/2019 Tumor Marker  ? Patient's tumor was tested for the fol

## 2021-11-06 NOTE — H&P (Signed)
?History and Physical  ? ? ?Patient: Paula Peterson HKV:[id number] DOB: 09-29-1940 ?DOA: 11/06/2021 ?DOS: the patient was seen and examined on 11/06/2021 ?PCP: Caryl Bis, MD  ?Patient coming from: Home ? ?Chief Complaint:  ?Chief Complaint  ?Patient presents with  ? Abdominal Pain  ? Emesis  ? ?HPI: Paula Peterson is a 81 y.o. female with medical history significant of DVT/PE on eliquis, ovarian cancer, HTN, hypothyroidism. Presenting with abdominal pain. Symptoms started yesterday. She had sharp, epigastric pain. It came in episodes lasting 5 - 10 minutes. She tried her regular pain meds, but they didn't help. She then started having episodes of vomiting. She tried her some anti-emetics. They helped a little, but not much. She didn't have any fever or diarrhea. When her symptoms continued this morning, she became concerned and came to the ED for help. She denies any other aggravating or alleviating factors.    ? ?Review of Systems: As mentioned in the history of present illness. All other systems reviewed and are negative. ?Past Medical History:  ?Diagnosis Date  ? Back pain   ? DVT (deep venous thrombosis) ([place]) 02/02/2019  ? had DVT first that moved into lung  ? GERD (gastroesophageal reflux disease)   ? History of pulmonary embolus (PE) 02/02/2019  ? confirmed by CT chest- had DVT first that moved into lungs per patient  ? Hypertension   ? Ovarian cancer ([place])   ? Pessary maintenance   ? Stroke [facility])   ? ?Past Surgical History:  ?Procedure Laterality Date  ? ABDOMINAL HYSTERECTOMY    ? ovaries left  ? BACK SURGERY    ? DEBULKING Bilateral 06/06/2019  ? Procedure: RADICAL TUMOR DEBULKING;  Surgeon: Everitt Amber, MD;  Location: [facility];  Service: Gynecology;  Laterality: Bilateral;  ? FRACTURE SURGERY    ? IR IMAGING GUIDED PORT INSERTION  12/15/2018  ? OMENTECTOMY Bilateral 06/06/2019  ? Procedure: OMENTECTOMY;  Surgeon: Everitt Amber, MD;  Location: [facility];  Service: Gynecology;  Laterality: Bilateral;  ?  SALPINGOOPHORECTOMY Bilateral 06/06/2019  ? Procedure: EXPLORATORY LAPAROTOMY,OPEN RIGHT SALPINGO OOPHORECTOMY;  Surgeon: Everitt Amber, MD;  Location: [facility];  Service: Gynecology;  Laterality: Bilateral;  ? ?Social History:  reports that she has never smoked. She has never used smokeless tobacco. She reports that she does not drink alcohol and does not use drugs. ? ?No Known Allergies ? ?Family History  ?Problem Relation Age of Onset  ? Stomach cancer Sister   ?     dx 50s-50s  ? Dementia Mother   ? Cancer Sister   ?     unk type, hysterectomy, dx 76s  ? ? ?Prior to Admission medications   ?Medication Sig Start Date End Date Taking? Authorizing Provider  ?acetaminophen (TYLENOL) 325 MG tablet Take 650 mg by mouth every 6 (six) hours as needed for moderate pain or headache.     [provider]  ?amoxicillin (AMOXIL) 500 MG tablet Take 1 tablet (500 mg total) by mouth 2 (two) times daily. 10/28/21   [name], MD  ?apixaban (ELIQUIS) 5 MG TABS tablet Take 1 tablet (5 mg total) by mouth 2 (two) times daily. 10/01/21   [name], MD  ?atenolol (TENORMIN) 25 MG tablet TAKE ONE (1) TABLET BY MOUTH EVERY DAY ?Patient taking differently: Take 25 mg by mouth daily. 07/23/21   [name], MD  ?Docusate Sodium 100 MG capsule Take 100 mg by mouth 2 (two) times daily as needed for constipation.    [provider]  ?fexofenadine (ALLEGRA) 180 MG tablet Take 180 mg by mouth daily as needed for allergies or rhinitis.    [provider]  ?levothyroxine (SYNTHROID) 75 MCG tablet Take 1 tablet (75 mcg total) by mouth daily before breakfast. 07/18/20   Roxan Hockey, MD  ?lidocaine-prilocaine (EMLA) cream Apply 1 application topically daily as needed (port access). 07/22/21   [name], MD  ?Melatonin 3 MG TABS Take 3 mg by mouth at bedtime as needed (sleep).    [provider]  ?nystatin (MYCOSTATIN) 100000 UNIT/ML suspension Take 5 mLs (500,000 Units total) by mouth 4 (four) times daily. Take 5  mls by mouth 4 times daily, swish and spit. ?Patient not taking: Reported on 11/06/2021 10/13/21   [name], MD  ?ondansetron (ZOFRAN) 8 MG tablet Take 1 tablet (8 mg total) by mouth 2 (two) times daily as needed (Nausea or vomiting). 12/20/20   [name], MD  ?polyethylene glycol (MIRALAX / GLYCOLAX) 17 g packet Take 17 g by mouth daily. 07/02/21   Georgette Shell, MD  ?senna-docusate (SENOKOT-S) 8.6-50 MG tablet Take 2 tablets by mouth at bedtime. ?Patient taking differently: Take 2 tablets by mouth at bedtime as needed for mild constipation. 07/17/20   Roxan Hockey, MD  ? ? ?Physical Exam: ?Vitals:  ? 11/06/21 1100 11/06/21 1130 11/06/21 1200 11/06/21 1300  ?BP: (!) 119/59 (!) 118/49 (!) 149/66 136/61  ?Pulse: 66 70 72 69  ?Resp: '12 15 16 '$ (!) 21  ?Temp:      ?TempSrc:      ?SpO2: 97% 96% 99% 100%  ? ?General: 81 y.o. female resting in bed in NAD ?Eyes: PERRL, normal sclera ?ENMT: Nares patent w/o discharge, orophaynx clear, dentition normal, ears w/o discharge/lesions/ulcers ?Neck: Supple, trachea midline ?Cardiovascular: RRR, +S1, S2, no m/g/r, equal pulses throughout ?Respiratory: CTABL, no w/r/r, normal WOB ?GI: BS+, ND, mild TTP LUQ/RUQ, no masses noted, no organomegaly noted ?MSK: No e/c/c ?Neuro: A&O x 3, no focal deficits ?Psyc: Appropriate interaction and affect, calm/cooperative ? ?Data Reviewed: ? ?Na+ 138 ?Scr  0.82 ?Hgb 9.3 ?Plt  124 ? ?CT ab/pelvis ?1. Mildly distended fluid-filled small bowel in the pelvis with fecalization of enteric contents and subtle adjacent edema in the subtending mesentery. No discrete transition zone evident. Imaging features are compatible with small bowel stricture or evolving in this patient with a reported history small bowel obstruction. ?2. Stable appearance of the right omental soft tissue lesion with scattered peritoneal and mesenteric nodules of ovarian cancer. ?3. Small right pleural effusion with right lower lobe atelectasis. ?4. Aortic Atherosclerosis  (ICD10-I70.0). ? ?Assessment and Plan: ?No notes have been filed under this hospital service. ?Service: Hospitalist ?SBO ?    - admit to inpt, med-surg ?    - SBO protocol started; keep NPO for now ?    - anti-emetics, pain control ?    - defer further imaging, plan to [facility] ? ?Hx of ovarian cancer ?    - on chemo w/ Dr. Alvy Bimler ?    - onco onboard ? ?Macrocytic anemia ?Thrombocytopenia ?    - stable, no signs of bleed, follow ? ?HTN ?    - will have PRNs available ?    - continue home regimen when taking PO again ? ?Hx of pulmonary embolism ?    - current NPO; normally takes eliquis ?    - heparin gtt until she is able to resume eliquis ? ?Hypothyroidism ?    - resume home regimen when  taking PO again ? ?Advance Care Planning:   Code Status: DNR ? ?Consults: [facility], onco ? ?Family Communication: w/ dtr at bedside ? ?Severity of Illness: ?The appropriate patient status for this patient is INPATIENT. Inpatient status is judged to be reasonable and necessary in order to provide the required intensity of service to ensure the patient's safety. The patient's presenting symptoms, physical exam findings, and initial radiographic and laboratory data in the context of their chronic comorbidities is felt to place them at high risk for further clinical deterioration. Furthermore, it is not anticipated that the patient will be medically stable for discharge from the hospital within 2 midnights of admission.  ? ?* I certify that at the point of admission it is my clinical judgment that the patient will require inpatient hospital care spanning beyond 2 midnights from the point of admission due to high intensity of service, high risk for further deterioration and high frequency of surveillance required.* ? ?Author: ?Jonnie Finner, DO ?11/06/2021 1:20 PM ? ?For on call review [email or web address].  ?

## 2021-11-06 NOTE — ED Triage Notes (Signed)
Pt reports with vomiting and abdominal pain since lunch time yesterday. Pt reports having a hx of bowel obstruction.  ?

## 2021-11-06 NOTE — Progress Notes (Signed)
ANTICOAGULATION CONSULT NOTE - Initial Consult ? ?Pharmacy Consult for Heparin  ?Indication: Hx PE/DVT ? ?No Known Allergies ? ?Patient Measurements: ?  ?Heparin Dosing Weight:      83.5 ? ?Vital Signs: ?Temp: 98.1 ?F (36.7 ?C) (05/11 0554) ?Temp Source: Oral (05/11 0554) ?BP: 136/61 (05/11 1300) ?Pulse Rate: 69 (05/11 1300) ? ?Labs: ?Recent Labs  ?  11/06/21 ?0612  ?HGB 9.3*  ?HCT 27.9*  ?PLT 124*  ?CREATININE 0.82  ? ? ?Estimated Creatinine Clearance: 68.2 mL/min (by C-G formula based on SCr of 0.82 mg/dL). ? ? ?Medical History: ?Past Medical History:  ?Diagnosis Date  ? Back pain   ? DVT (deep venous thrombosis) ([place]) 02/02/2019  ? had DVT first that moved into lung  ? GERD (gastroesophageal reflux disease)   ? History of pulmonary embolus (PE) 02/02/2019  ? confirmed by CT chest- had DVT first that moved into lungs per patient  ? Hypertension   ? Ovarian cancer ([place])   ? Pessary maintenance   ? Stroke [facility])   ? ? ?Medications:  ?Scheduled:  ? diatrizoate meglumine-sodium  90 mL Per NG tube Once  ? sodium chloride (PF)      ? ?Infusions:  ? sodium chloride    ? ? ?Assessment: ?64 yoF presents to ED on 5/11 with abdominal pain, Hx ovarian cancer with carcinomatosis, and CT concerning for SBO.  Pharmacy is consulted to dose Heparin for hx of DVT/PE while PTA apixaban is held due to NPO status.   ?Last apixaban 5 mg BID given on 5/10 at 8pm (last filled 09/01/21 for 30 days supply) ? ?Baseline coags: ip ?CBC: Hgb low/stable at 9.3, Plt 124 ?Bleeding - No current s/s bleeding reported this admission.  Dr. Alvy Bimler on 5/2 reports minimal bleeding, ok to continue indefinite anticoagulation as long as Plt > 50k.   ?SCr 0.8 ? ? ?Goal of Therapy:  ?Heparin level 0.3-0.7 units/ml ?aPTT 66-102 seconds ?Monitor platelets by anticoagulation protocol: Yes ?  ?Plan:  ?No heparin bolus given recent DOAC use  ?Start heparin IV infusion at 1250 units/hr ?Heparin level / APTT 8 hours after starting  ?Daily heparin level and  CBC ? ? ?Gretta Arab PharmD, BCPS ?Clinical Pharmacist ?[facility] [phone] ?11/06/2021 1:56 PM ? ? ?

## 2021-11-06 NOTE — ED Provider Notes (Signed)
?[facility] DEPT ?Provider Note ? ? ?CSN: [id number] ?Arrival date & time: 11/06/21  0547 ? ?  ? ?History ? ?Chief Complaint  ?Patient presents with  ? Abdominal Pain  ? Emesis  ? ? ?Paula Peterson is a 81 y.o. female with Pmhx HTN, DVT/PE on Eliquis, Ovarian cancer currently undergoing chemotherapy who presents to the ED today with complaint of gradual onset, constant, sharp, diffuse abd pain that began yesterday afternoon. Pt reports she ate [place] food for lunch and about 2 hours later began having symptoms. She also complains of nausea and NBNB emesis. Last had a BM yesterday which was normal for her; no diarrhea. Still passing gas. Does report hx of bowel obstruction in the past however states this feels somewhat different. Her family was concerned due to history of bowel obstruction and brought her in to the ED for further eval. She is unsure if her friends who ate the same food as her are having symptoms. Denies fevers or chills. Previous abd surgery includes hysterectomy, salpingoopherectomy, and omentectomy.  ? ?The history is provided by the patient, medical records and a relative.  ? ?  ? ?Home Medications ?Prior to Admission medications   ?Medication Sig Start Date End Date Taking? Authorizing Provider  ?acetaminophen (TYLENOL) 325 MG tablet Take 650 mg by mouth every 6 (six) hours as needed for moderate pain or headache.     [provider]  ?apixaban (ELIQUIS) 5 MG TABS tablet Take 1 tablet (5 mg total) by mouth 2 (two) times daily. 10/01/21   [name], MD  ?atenolol (TENORMIN) 25 MG tablet TAKE ONE (1) TABLET BY MOUTH EVERY DAY ?Patient taking differently: Take 25 mg by mouth daily. 07/23/21   [name], MD  ?Docusate Sodium 100 MG capsule Take 100 mg by mouth 2 (two) times daily as needed for constipation.    [provider]  ?DULoxetine (CYMBALTA) 20 MG capsule Take 20 mg by mouth daily. 07/08/21   [provider]  ?fexofenadine (ALLEGRA) 180  MG tablet Take 180 mg by mouth daily as needed for allergies or rhinitis.    [provider]  ?levothyroxine (SYNTHROID) 75 MCG tablet Take 1 tablet (75 mcg total) by mouth daily before breakfast. 07/18/20   Roxan Hockey, MD  ?lidocaine-prilocaine (EMLA) cream Apply 1 application topically daily as needed (port access). 07/22/21   [name], MD  ?Melatonin 3 MG TABS Take 3 mg by mouth at bedtime as needed (sleep).    [provider]  ?nystatin (MYCOSTATIN) 100000 UNIT/ML suspension Take 5 mLs (500,000 Units total) by mouth 4 (four) times daily. Take 5 mls by mouth 4 times daily, swish and spit. ?Patient not taking: Reported on 11/06/2021 10/13/21   [name], MD  ?ondansetron (ZOFRAN) 8 MG tablet Take 1 tablet (8 mg total) by mouth 2 (two) times daily as needed (Nausea or vomiting). 12/20/20   [name], MD  ?polyethylene glycol (MIRALAX / GLYCOLAX) 17 g packet Take 17 g by mouth daily. 07/02/21   Georgette Shell, MD  ?senna-docusate (SENOKOT-S) 8.6-50 MG tablet Take 2 tablets by mouth at bedtime. ?Patient taking differently: Take 2 tablets by mouth at bedtime as needed for mild constipation. 07/17/20   Roxan Hockey, MD  ?   ? ?Allergies    ?Patient has no known allergies.   ? ?Review of Systems   ?Review of Systems  ?Constitutional:  Negative for chills and fever.  ?Cardiovascular:  Negative for chest pain.  ?Gastrointestinal:  Positive for abdominal pain, nausea and vomiting. Negative for blood in stool, constipation and diarrhea.  ?Genitourinary:  Negative for dysuria and frequency.  ?All other systems reviewed and are negative. ? ?Physical Exam ?Updated Vital Signs ?BP (!) 122/97 (BP Location: Left Arm)   Pulse 80   Temp 98.1 ?F (36.7 ?C) (Oral)   Resp 16   SpO2 95%  ?Physical Exam ?Vitals and nursing note reviewed.  ?Constitutional:   ?   Appearance: She is not ill-appearing or diaphoretic.  ?HENT:  ?   Head: Normocephalic and atraumatic.  ?Eyes:  ?   Conjunctiva/sclera:  Conjunctivae normal.  ?Cardiovascular:  ?   Rate and Rhythm: Normal rate and regular rhythm.  ?Pulmonary:  ?   Effort: Pulmonary effort is normal.  ?   Breath sounds: Normal breath sounds. No wheezing, rhonchi or rales.  ?Abdominal:  ?   General: Abdomen is flat. Bowel sounds are decreased.  ?   Palpations: Abdomen is soft.  ?   Tenderness: There is generalized abdominal tenderness. There is no guarding or rebound.  ?Musculoskeletal:  ?   Cervical back: Neck supple.  ?Skin: ?   General: Skin is warm and dry.  ?Neurological:  ?   Mental Status: She is alert.  ? ? ?ED Results / Procedures / Treatments   ?Labs ?(all labs ordered are listed, but only abnormal results are displayed) ?Labs Reviewed  ?COMPREHENSIVE METABOLIC PANEL - Abnormal; Notable for the following components:  ?    Result Value  ? Glucose, Bld 111 (*)   ? Albumin 3.4 (*)   ? All other components within normal limits  ?CBC WITH DIFFERENTIAL/PLATELET - Abnormal; Notable for the following components:  ? RBC 2.43 (*)   ? Hemoglobin 9.3 (*)   ? HCT 27.9 (*)   ? MCV 114.8 (*)   ? MCH 38.3 (*)   ? Platelets 124 (*)   ? All other components within normal limits  ?URINALYSIS, ROUTINE W REFLEX MICROSCOPIC - Abnormal; Notable for the following components:  ? Color, Urine STRAW (*)   ? All other components within normal limits  ? ? ?EKG ?None ? ?Radiology ?CT Abdomen Pelvis W Contrast ? ?Result Date: 11/06/2021 ?CLINICAL DATA:  Abdominal pain and vomiting EXAM: CT ABDOMEN AND PELVIS WITH CONTRAST TECHNIQUE: Multidetector CT imaging of the abdomen and pelvis was performed using the standard protocol following bolus administration of intravenous contrast. RADIATION DOSE REDUCTION: This exam was performed according to the departmental dose-optimization program which includes automated exposure control, adjustment of the mA and/or kV according to patient size and/or use of iterative reconstruction technique. CONTRAST:  167m OMNIPAQUE IOHEXOL 300 MG/ML  SOLN  COMPARISON:  09/23/2021 FINDINGS: Study limited by technical issues. The scanner stopped twice during the study resulting in a fragmented exam into 3 parts. Lower chest: Small right pleural effusion with right lower lobe atelectasis. Hepatobiliary: No suspicious focal abnormality within the liver parenchyma. There is no evidence for gallstones, gallbladder wall thickening, or pericholecystic fluid. No intrahepatic or extrahepatic biliary dilation. Pancreas: No focal mass lesion. No dilatation of the main duct. No intraparenchymal cyst. No peripancreatic edema. Spleen: No splenomegaly. No focal mass lesion. Adrenals/Urinary Tract: No adrenal nodule or mass. Right kidney unremarkable. Central sinus cysts noted left kidney. No evidence for hydroureter. The urinary bladder appears normal for the degree of distention. Stomach/Bowel: Stomach is unremarkable. No gastric wall thickening. No evidence of outlet obstruction. Duodenum is normally positioned as is the ligament of Treitz. Small bowel in the  pelvis is fluid-filled and distended up to 2.7 cm. There is a segment in the anterior pelvis demonstrating fecalization of enteric contents and subtle adjacent edema in the subtending mesentery. No discrete transition zone evident. Distal ileum is decompressed. Terminal ileum unremarkable. The appendix is not well visualized, but there is no edema or inflammation in the region of the cecum. No gross colonic mass. No colonic wall thickening. Vascular/Lymphatic: There is moderate atherosclerotic calcification of the abdominal aorta without aneurysm. There is no gastrohepatic or hepatoduodenal ligament lymphadenopathy. No retroperitoneal or mesenteric lymphadenopathy. No pelvic sidewall lymphadenopathy. 15 mm short axis lymph node identified in the right groin is mildly enlarged, similar to prior. Reproductive: Uterus surgically absent. Pessary identified in the vagina. Other: Trace free fluid is seen in the pelvis. 4.7 x 1.4 cm  right omental soft tissue identified previously is stable on 46/3 today. There is peritoneal thickening along the left paracolic gutter with additional scattered peritoneal and mesenteric nodules (left omentum 38/3, left p

## 2021-11-07 ENCOUNTER — Inpatient Hospital Stay (HOSPITAL_COMMUNITY): Payer: Medicare Other

## 2021-11-07 DIAGNOSIS — I10 Essential (primary) hypertension: Secondary | ICD-10-CM

## 2021-11-07 DIAGNOSIS — E039 Hypothyroidism, unspecified: Secondary | ICD-10-CM | POA: Diagnosis not present

## 2021-11-07 DIAGNOSIS — E669 Obesity, unspecified: Secondary | ICD-10-CM

## 2021-11-07 DIAGNOSIS — K56609 Unspecified intestinal obstruction, unspecified as to partial versus complete obstruction: Secondary | ICD-10-CM | POA: Diagnosis not present

## 2021-11-07 DIAGNOSIS — D539 Nutritional anemia, unspecified: Secondary | ICD-10-CM | POA: Diagnosis not present

## 2021-11-07 DIAGNOSIS — I2699 Other pulmonary embolism without acute cor pulmonale: Secondary | ICD-10-CM

## 2021-11-07 LAB — COMPREHENSIVE METABOLIC PANEL
ALT: 13 U/L (ref 0–44)
AST: 16 U/L (ref 15–41)
Albumin: 3.1 g/dL — ABNORMAL LOW (ref 3.5–5.0)
Alkaline Phosphatase: 88 U/L (ref 38–126)
Anion gap: 6 (ref 5–15)
BUN: 11 mg/dL (ref 8–23)
CO2: 26 mmol/L (ref 22–32)
Calcium: 9.3 mg/dL (ref 8.9–10.3)
Chloride: 104 mmol/L (ref 98–111)
Creatinine, Ser: 0.81 mg/dL (ref 0.44–1.00)
GFR, Estimated: >60 mL/min (ref >60)
Glucose, Bld: 92 mg/dL (ref 70–99)
Potassium: 3.8 mmol/L (ref 3.5–5.1)
Sodium: 136 mmol/L (ref 135–145)
Total Bilirubin: 0.7 mg/dL (ref 0.3–1.2)
Total Protein: 6 g/dL — ABNORMAL LOW (ref 6.5–8.1)

## 2021-11-07 LAB — CBC
HCT: 25.6 % — ABNORMAL LOW (ref 36.0–46.0)
Hemoglobin: 8.4 g/dL — ABNORMAL LOW (ref 12.0–15.0)
MCH: 38.4 pg — ABNORMAL HIGH (ref 26.0–34.0)
MCHC: 32.8 g/dL (ref 30.0–36.0)
MCV: 116.9 fL — ABNORMAL HIGH (ref 80.0–100.0)
Platelets: 119 10*3/uL — ABNORMAL LOW (ref 150–400)
RBC: 2.19 MIL/uL — ABNORMAL LOW (ref 3.87–5.11)
RDW: 15 % (ref 11.5–15.5)
WBC: 2.5 10*3/uL — ABNORMAL LOW (ref 4.0–10.5)
nRBC: 0 % (ref 0.0–0.2)

## 2021-11-07 MED ORDER — APIXABAN 5 MG PO TABS
5.0000 mg | ORAL_TABLET | Freq: Two times a day (BID) | ORAL | Status: DC
Start: 2021-11-07 — End: 2021-11-10
  Administered 2021-11-07 – 2021-11-10 (×6): 5 mg via ORAL
  Filled 2021-11-07 (×6): qty 1

## 2021-11-07 MED ORDER — CHLORHEXIDINE GLUCONATE CLOTH 2 % EX PADS
6.0000 | MEDICATED_PAD | Freq: Every day | CUTANEOUS | Status: DC
  Administered 2021-11-07 – 2021-11-09 (×3): 6 via TOPICAL

## 2021-11-07 MED ORDER — LEVOTHYROXINE SODIUM 75 MCG PO TABS
75.0000 ug | ORAL_TABLET | Freq: Every day | ORAL | Status: DC
  Administered 2021-11-08 – 2021-11-10 (×3): 75 ug via ORAL
  Filled 2021-11-07 (×3): qty 1

## 2021-11-07 MED ORDER — ATENOLOL 25 MG PO TABS
25.0000 mg | ORAL_TABLET | Freq: Every day | ORAL | Status: DC
  Administered 2021-11-07 – 2021-11-10 (×4): 25 mg via ORAL
  Filled 2021-11-07 (×4): qty 1

## 2021-11-07 MED ORDER — DEXTROSE-NACL 5-0.9 % IV SOLN: INTRAVENOUS | Status: DC

## 2021-11-07 NOTE — Plan of Care (Signed)

## 2021-11-07 NOTE — Progress Notes (Signed)
[facility] ?Progress Note ? ?   ?Subjective: ?CC:  ?Pain improved. Denies nausea/vomiting. Reports a lot of flatus overnight and this AM. No BM yet. ? ?Objective: ?Vital signs in last 24 hours: ?Temp:  [97.7 ?F (36.5 ?C)-98.4 ?F (36.9 ?C)] 98.1 ?F (36.7 ?C) (05/12 0551) ?Pulse Rate:  [66-77] 72 (05/12 0551) ?Resp:  [16-21] 18 (05/12 0551) ?BP: (108-155)/(46-72) 155/66 (05/12 0551) ?SpO2:  [94 %-100 %] 98 % (05/12 0551) ?Weight:  [98.8 kg] 98.8 kg (05/11 1500) ?Last BM Date : 11/05/21 ? ?Intake/Output from previous day: ?05/11 0701 - 05/12 0700 ?In: 2002 [I.V.:1002; IV Piggyback:1000] ?Out: -  ?Intake/Output this shift: ?Total I/O ?In: 602.2 [P.O.:240; I.V.:362.2] ?Out: 100 [Urine:100] ? ?PE: ?Gen:  Alert, NAD, pleasant, up in chair ?Card:  Regular rate and rhythm, pedal pulses 2+ BL ?Pulm:  Normal effort ?Abd: Soft, non-tender, mild distention ?Skin: warm and dry, no rashes  ?Psych: A&Ox3  ? ?Lab Results:  ?Recent Labs  ?  11/06/21 ?0612 11/07/21 ?0502  ?WBC 4.1 2.5*  ?HGB 9.3* 8.4*  ?HCT 27.9* 25.6*  ?PLT 124* 119*  ? ?BMET ?Recent Labs  ?  11/06/21 ?0612 11/07/21 ?0502  ?NA 138 136  ?K 3.8 3.8  ?CL 103 104  ?CO2 25 26  ?GLUCOSE 111* 92  ?BUN 15 11  ?CREATININE 0.82 0.81  ?CALCIUM 9.5 9.3  ? ?PT/INR ?Recent Labs  ?  11/06/21 ?1715  ?LABPROT 13.7  ?INR 1.1  ? ?CMP  ?   ?Component Value Date/Time  ? NA 136 11/07/2021 0502  ? K 3.8 11/07/2021 0502  ? CL 104 11/07/2021 0502  ? CO2 26 11/07/2021 0502  ? GLUCOSE 92 11/07/2021 0502  ? BUN 11 11/07/2021 0502  ? CREATININE 0.81 11/07/2021 0502  ? CREATININE 1.14 (H) 10/28/2021 1044  ? CALCIUM 9.3 11/07/2021 0502  ? PROT 6.0 (L) 11/07/2021 0502  ? ALBUMIN 3.1 (L) 11/07/2021 0502  ? AST 16 11/07/2021 0502  ? AST 19 10/28/2021 1044  ? ALT 13 11/07/2021 0502  ? ALT 21 10/28/2021 1044  ? ALKPHOS 88 11/07/2021 0502  ? BILITOT 0.7 11/07/2021 0502  ? BILITOT 0.3 10/28/2021 1044  ? GFRNONAA >60 11/07/2021 0502  ? GFRNONAA 49 (L) 10/28/2021 1044  ? GFRAA >60 03/25/2020  1144  ? ?Lipase  ?   ?Component Value Date/Time  ? LIPASE 21 06/25/2021 2334  ? ? ? ? ? ?Studies/Results: ?CT Abdomen Pelvis W Contrast ? ?Result Date: 11/06/2021 ?CLINICAL DATA:  Abdominal pain and vomiting EXAM: CT ABDOMEN AND PELVIS WITH CONTRAST TECHNIQUE: Multidetector CT imaging of the abdomen and pelvis was performed using the standard protocol following bolus administration of intravenous contrast. RADIATION DOSE REDUCTION: This exam was performed according to the departmental dose-optimization program which includes automated exposure control, adjustment of the mA and/or kV according to patient size and/or use of iterative reconstruction technique. CONTRAST:  138m OMNIPAQUE IOHEXOL 300 MG/ML  SOLN COMPARISON:  09/23/2021 FINDINGS: Study limited by technical issues. The scanner stopped twice during the study resulting in a fragmented exam into 3 parts. Lower chest: Small right pleural effusion with right lower lobe atelectasis. Hepatobiliary: No suspicious focal abnormality within the liver parenchyma. There is no evidence for gallstones, gallbladder wall thickening, or pericholecystic fluid. No intrahepatic or extrahepatic biliary dilation. Pancreas: No focal mass lesion. No dilatation of the main duct. No intraparenchymal cyst. No peripancreatic edema. Spleen: No splenomegaly. No focal mass lesion. Adrenals/Urinary Tract: No adrenal nodule or mass. Right kidney unremarkable. Central sinus cysts noted  left kidney. No evidence for hydroureter. The urinary bladder appears normal for the degree of distention. Stomach/Bowel: Stomach is unremarkable. No gastric wall thickening. No evidence of outlet obstruction. Duodenum is normally positioned as is the ligament of Treitz. Small bowel in the pelvis is fluid-filled and distended up to 2.7 cm. There is a segment in the anterior pelvis demonstrating fecalization of enteric contents and subtle adjacent edema in the subtending mesentery. No discrete transition zone  evident. Distal ileum is decompressed. Terminal ileum unremarkable. The appendix is not well visualized, but there is no edema or inflammation in the region of the cecum. No gross colonic mass. No colonic wall thickening. Vascular/Lymphatic: There is moderate atherosclerotic calcification of the abdominal aorta without aneurysm. There is no gastrohepatic or hepatoduodenal ligament lymphadenopathy. No retroperitoneal or mesenteric lymphadenopathy. No pelvic sidewall lymphadenopathy. 15 mm short axis lymph node identified in the right groin is mildly enlarged, similar to prior. Reproductive: Uterus surgically absent. Pessary identified in the vagina. Other: Trace free fluid is seen in the pelvis. 4.7 x 1.4 cm right omental soft tissue identified previously is stable on 46/3 today. There is peritoneal thickening along the left paracolic gutter with additional scattered peritoneal and mesenteric nodules (left omentum 38/3, left para colonic on 48/3, and central mesentery on 54/3). Musculoskeletal: No worrisome lytic or sclerotic osseous abnormality. Superior endplate compression deformity noted at L4 and L5. IMPRESSION: 1. Mildly distended fluid-filled small bowel in the pelvis with fecalization of enteric contents and subtle adjacent edema in the subtending mesentery. No discrete transition zone evident. Imaging features are compatible with small bowel stricture or evolving in this patient with a reported history small bowel obstruction. 2. Stable appearance of the right omental soft tissue lesion with scattered peritoneal and mesenteric nodules of ovarian cancer. 3. Small right pleural effusion with right lower lobe atelectasis. 4. Aortic Atherosclerosis (ICD10-I70.0). Electronically Signed   By: Misty Stanley M.D.   On: 11/06/2021 09:43  ? ?DG Abd Portable 1V ? ?Result Date: 11/07/2021 ?CLINICAL DATA:  Small bowel obstruction. EXAM: PORTABLE ABDOMEN - 1 VIEW COMPARISON:  06/28/2021 FINDINGS: Bowel gas pattern is  nonobstructive. There is no free peritoneal air. Single surgical clip over the mid abdomen. Degenerative changes of the spine and hips. IMPRESSION: Nonobstructive bowel gas pattern. Electronically Signed   By: Marin Olp M.D.   On: 11/07/2021 07:59   ? ?Anti-infectives: ?Anti-infectives (From admission, onward)  ? ? None  ? ?  ? ? ? ?Assessment/Plan ? SBO, ?small bowel stricture ?Hx Ovarian cancer with carcinomatosis  ?- CT scan today shows distended fluid-filled small bowel in the pelvis with fecalization; no discrete transition zone; imaging compatible with small bowel stricture or evolving in this patient with a reported history small bowel obstruction ?- clinically improving with less pain, having flatus. KUB with non-obstructive pattern. Would start CLD and advance per oncology recs. No emergent surgical needs ? ?Dr. Alvy Bimler following ?  ?ID - none ?VTE - hold eliquis, ok for heparin gtt if needed ?FEN - IVF, NPO ?Foley - none ?  ?Ovarian cancer with carcinomatosis ?-s/p ex lap right salpingo-oophorectomy, omentectomy radical tumor debulking 06/06/2019 by Dr. Denman George ?- currently undergoing chemotherapy carboplatin + Doxil (last dose 10/28/21) with good response on CT scan from 08/2021), followed by Dr. Alvy Bimler ?H/o DVT/PE on eliquis indefinitely  ?CKD ?Pancytopenia ?Hypothyroidism ?GERD ?HTN ?  ? LOS: 1 day  ? ?I reviewed nursing notes, Consultant hematology/oncology notes, hospitalist notes, last 24 h vitals and pain scores, last 48 h  intake and output, last 24 h labs and trends, and last 24 h imaging results. ? ? ? ?Obie Dredge, PA-C ?[facility] ?Please see Amion for pager number during day hours 7:00am-4:30pm ? ? ? ? ? ? ?

## 2021-11-07 NOTE — Progress Notes (Signed)
?PROGRESS NOTE ? ? ? ?Paula Peterson  IOE:[id number] DOB: [birth year] DOA: 11/06/2021 ?PCP: Caryl Bis, MD  ? ? ? ?Brief Narrative:  ?81 y.o. WF PMHx DVT/PE on Eliquis, ovarian cancer, HTN, hypothyroidism.  ? ?Presenting with abdominal pain. Symptoms started yesterday. She had sharp, epigastric pain. It came in episodes lasting 5 - 10 minutes. She tried her regular pain meds, but they didn't help. She then started having episodes of vomiting. She tried her some anti-emetics. They helped a little, but not much. She didn't have any fever or diarrhea. When her symptoms continued this morning, she became concerned and came to the ED for help. She denies any other aggravating or alleviating factors.   ? ? ?Subjective: ?Afebrile overnight A/O x4.  Negative nausea, negative vomiting, positive flatulence, negative BM. ? ? ?Assessment & Plan: ?Covid vaccination; ?  ?Principal Problem: ?  SBO (small bowel obstruction) ([place]) ?Active Problems: ?  Acquired hypothyroidism ?  PE (pulmonary thromboembolism) ([place]) ?  Essential hypertension ?  CKD (chronic kidney disease), stage III ([place]) ?  Obesity (BMI 30-39.9) ?  Macrocytic anemia ? ?SBO ?-N.p.o. ?- Antiemetics PRN ?-5/12 D5-0.9% saline 85m/hr ?-5/12 start clear liquid diet ?  ?Right ovarian epithelial cancer ([place] ?-Her recent CT imaging in March showed positive response to treatment ?This is at the expense of severe pancytopenia with history of infection ?-Per EMR Dr. GAlvy Bimleroncology discussed the risk and benefits of continuing treatment with combination of carboplatin and Doxil, continuing treatment with Doxil only I will take a break from treatment ?After much discussion, she is in agreement to proceed with combination of carboplatin and Doxil ?-Per EMR Dr. GAlvy Bimleroncology made patient aware about risk of cardiomyopathy with Doxil and risk of worsening pancytopenia and allergic reaction with carboplatin ?-Echocardiogram report was reviewed, without signs of  cardiomyopathy ?-She received her last chemo treatment on 10/28/2021 ?-CT abdomen/pelvis performed this admission shows stable disease ?-Plan to continue outpatient chemo ?-Seen by Dr. NHeath Larkoncology: On board seeing patient. ?  ?Macrocytic anemia ?-5/12 anemia panel pending ? ?Thrombocytopenia ? Latest Reference Range & Units 10/28/21 10:44 11/06/21 06:12 11/07/21 05:02  ?Platelets 150 - 400 K/uL 125 (L) 124 (L) 119 (L)  ?(L): Data is abnormally low ?-Trending down, no signs of overt bleeding monitor closely ?  ?HTN ?-5/12 Atenolol 25 mg daily ?-Hydralazine PRN ? ?Hx PE ?- Restart home Eliquis dose 5 mg BID ? ?Hypothyroidism ?-Synthroid 75 mcg daily ? ?Obesity (BMI 34.11 kg/m?). ?-Address with PCP.  Patient with cancer  ?  ? ? ?Mobility Assessment (last 72 hours)   ? ? Mobility Assessment   ? ? [place] 11/07/21 0800 11/07/21 0700 11/06/21 1500  ?  ?  ? Does patient have an order for bedrest or is patient medically unstable No - Continue assessment No - Continue assessment No - Continue assessment    ? What is the highest level of mobility based on the progressive mobility assessment? Level 5 (Walks with assist in room/hall) - Balance while stepping forward/back and can walk in room with assist - Complete Level 5 (Walks with assist in room/hall) - Balance while stepping forward/back and can walk in room with assist - Complete Level 5 (Walks with assist in room/hall) - Balance while stepping forward/back and can walk in room with assist - Complete    ? ?  ?  ? ?  ? ? ? ?'@IPAL'$ @ ? ?   ?DVT prophylaxis: Eliquis ?Code Status: DNR ?Family Communication:  ?Status  is: Inpatient ? ? ? ?Dispo: The patient is from: Home ?             Anticipated d/c is to: Home ?             Anticipated d/c date is: 3 days ?             Patient currently is not medically stable to d/c. ? ? ? ? ? ?Consultants:  ? ? ?Procedures/Significant Events:  ? ? ?I have personally reviewed and interpreted all radiology studies and my findings are as  above. ? ?VENTILATOR SETTINGS: ? ? ? ?Cultures ? ? ?Antimicrobials: ?Anti-infectives (From admission, onward)  ? ? None  ? ?  ?  ? ? ?Devices ?  ? ?LINES / TUBES:  ? ? ? ? ?Continuous Infusions: ? dextrose 5 % and 0.9% NaCl 75 mL/hr at 11/07/21 1036  ? ? ? ?Objective: ?Vitals:  ? 11/06/21 2142 11/07/21 0118 11/07/21 0551 11/07/21 1329  ?BP: (!) 108/46 (!) 130/55 (!) 155/66 (!) 156/88  ?Pulse: 77 72 72 86  ?Resp: '18 18 18 16  '$ ?Temp: 98.4 ?F (36.9 ?C) 98.1 ?F (36.7 ?C) 98.1 ?F (36.7 ?C) 97.9 ?F (36.6 ?C)  ?TempSrc: Oral Oral  Oral  ?SpO2: 94% 99% 98% 99%  ?Weight:      ?Height:      ? ? ?Intake/Output Summary (Last 24 hours) at 11/07/2021 1836 ?Last data filed at 11/07/2021 1800 ?Gross per 24 hour  ?Intake 2322.93 ml  ?Output 400 ml  ?Net 1922.93 ml  ? ?Filed Weights  ? 11/06/21 1500  ?Weight: 98.8 kg  ? ? ?Examination: ? ?General: A/O x4, No acute respiratory distress.  Sitting in chair ?Eyes: negative scleral hemorrhage, negative anisocoria, negative icterus ?ENT: Negative Runny nose, negative gingival bleeding, ?Neck:  Negative scars, masses, torticollis, lymphadenopathy, JVD ?Lungs: Clear to auscultation bilaterally without wheezes or crackles ?Cardiovascular: Regular rate and rhythm without murmur gallop or rub normal S1 and S2 ?Abdomen: negative abdominal pain, nondistended, positive soft, bowel sounds, no rebound, no ascites, no appreciable mass ?Extremities: No significant cyanosis, clubbing, or edema bilateral lower extremities ?Skin: Negative rashes, lesions, ulcers ?Psychiatric:  Negative depression, negative anxiety, negative fatigue, negative mania  ?Central nervous system:  Cranial nerves II through XII intact, tongue/uvula midline, all extremities muscle strength 5/5, sensation intact throughout, negative dysarthria, negative expressive aphasia, negative receptive aphasia. ? ?.  ? ? ? ?Data Reviewed: Care during the described time interval was provided by me .  I have reviewed this patient's available  data, including medical history, events of note, physical examination, and all test results as part of my evaluation. ? ?CBC: ?Recent Labs  ?Lab 11/06/21 ?0612 11/07/21 ?0502  ?WBC 4.1 2.5*  ?NEUTROABS 2.7  --   ?HGB 9.3* 8.4*  ?HCT 27.9* 25.6*  ?MCV 114.8* 116.9*  ?PLT 124* 119*  ? ?Basic Metabolic Panel: ?Recent Labs  ?Lab 11/06/21 ?0612 11/07/21 ?0502  ?NA 138 136  ?K 3.8 3.8  ?CL 103 104  ?CO2 25 26  ?GLUCOSE 111* 92  ?BUN 15 11  ?CREATININE 0.82 0.81  ?CALCIUM 9.5 9.3  ? ?GFR: ?Estimated Creatinine Clearance: 69.1 mL/min (by C-G formula based on SCr of 0.81 mg/dL). ?Liver Function Tests: ?Recent Labs  ?Lab 11/06/21 ?0612 11/07/21 ?0502  ?AST 18 16  ?ALT 16 13  ?ALKPHOS 91 88  ?BILITOT 0.7 0.7  ?PROT 6.6 6.0*  ?ALBUMIN 3.4* 3.1*  ? ?No results for input(s): LIPASE, AMYLASE in the last 168 hours. ?No  results for input(s): AMMONIA in the last 168 hours. ?Coagulation Profile: ?Recent Labs  ?Lab 11/06/21 ?1715  ?INR 1.1  ? ?Cardiac Enzymes: ?No results for input(s): CKTOTAL, CKMB, CKMBINDEX, TROPONINI in the last 168 hours. ?BNP (last 3 results) ?No results for input(s): PROBNP in the last 8760 hours. ?HbA1C: ?No results for input(s): HGBA1C in the last 72 hours. ?CBG: ?No results for input(s): GLUCAP in the last 168 hours. ?Lipid Profile: ?No results for input(s): CHOL, HDL, LDLCALC, TRIG, CHOLHDL, LDLDIRECT in the last 72 hours. ?Thyroid Function Tests: ?No results for input(s): TSH, T4TOTAL, FREET4, T3FREE, THYROIDAB in the last 72 hours. ?Anemia Panel: ?No results for input(s): VITAMINB12, FOLATE, FERRITIN, TIBC, IRON, RETICCTPCT in the last 72 hours. ?Sepsis Labs: ?No results for input(s): PROCALCITON, LATICACIDVEN in the last 168 hours. ? ?No results found for this or any previous visit (from the past 240 hour(s)).  ? ? ? ? ? ?Radiology Studies: ?CT Abdomen Pelvis W Contrast ? ?Result Date: 11/06/2021 ?CLINICAL DATA:  Abdominal pain and vomiting EXAM: CT ABDOMEN AND PELVIS WITH CONTRAST TECHNIQUE: Multidetector  CT imaging of the abdomen and pelvis was performed using the standard protocol following bolus administration of intravenous contrast. RADIATION DOSE REDUCTION: This exam was performed according to the departmental do

## 2021-11-07 NOTE — Progress Notes (Signed)
?  Transition of Care (TOC) Screening Note ? ? ?Patient Details  ?Name: Paula Peterson ?Date of Birth: Nov 26, 1940 ? ? ?Transition of Care (TOC) CM/SW Contact:    ?Varun Jourdan, LCSW ?Phone Number: ?11/07/2021, 10:24 AM ? ? ? ?Transition of Care Department [facility]) has reviewed patient and no TOC needs have been identified at this time. We will continue to monitor patient advancement through interdisciplinary progression rounds. If new patient transition needs arise, please place a TOC consult. ? ? ?

## 2021-11-07 NOTE — Progress Notes (Signed)
Paula Peterson   DOB:1940/11/30   XT#:[id number]   ? ?ASSESSMENT & PLAN:  ? ?Right ovarian epithelial cancer ([place]) ?Her recent CT imaging in March showed positive response to treatment ?This is at the expense of severe pancytopenia with history of infection ?Moving forward, we discussed the risk and benefits of continuing treatment with combination of carboplatin and Doxil, continuing treatment with Doxil only I will take a break from treatment ?After much discussion, she is in agreement to proceed with combination of carboplatin and Doxil ?She is made aware about risk of cardiomyopathy with Doxil and risk of worsening pancytopenia and allergic reaction with carboplatin ?Echocardiogram report was reviewed, without signs of cardiomyopathy ?She received her last treatment on 10/28/2021 ?CT abdomen/pelvis performed this admission shows stable disease ?Plan to continue outpatient chemo ?Continue supportive care ?  ?Abdominal pain, nausea, vomiting,?  Stricture versus SBO ?Abdominal pain and vomiting improved after receiving pain medication and antiemetics ?Has been seen by general surgery who is recommended NG tube placement and bowel rest ?NG tube currently on hold per patient request ?We do not feel strongly that NG tube needs to be placed if she is no longer having abdominal pain, nausea, vomiting ?I do not believe this is due to disease progression ?She had similar hospitalization earlier this year related to stricture that resolved conservatively ?Continue supportive care with IV fluids, bowel rest and pain medicine as needed ?  ?Pancytopenia, acquired ([place]) ?She has pancytopenia due to recent treatment and mild hemodilution ?White blood cell count is normal ?Her hemoglobin is stable at 9.3 today and platelets are mildly low at 124,000 ?Continue to monitor ?There is no contraindication to remain on antiplatelet agents or anticoagulants as long as the platelet is greater than 50,000 ?  ?PE (pulmonary thromboembolism)  ([place]) ?She had minimal bleeding ?She will continue anticoagulation therapy indefinitely ?There is no contraindication to remain on antiplatelet agents or anticoagulants as long as the platelet is greater than 50,000. ?  ?CKD (chronic kidney disease), stage III ([place]), stable ?She has mild intermittent elevated serum creatinine ?  ?Discharge planning ?Continue bowel rest.  Will need control of her abdominal pain, nausea, vomiting before she can be considered for discharge. ?Overall, she is improving ?Hopefully, if it continues to improve, she can advance her diet over the weekend ?I will return to check on her next week ? ?All questions were answered. The patient knows to call the clinic with any problems, questions or concerns. ?  ?The total time spent in the appointment was 40 minutes encounter with patients including review of chart and various tests results, discussions about plan of care and coordination of care plan ? ?[name], MD ?11/07/2021 8:19 AM ? ?Subjective:  ?The patient was admitted from the emergency room for management of small bowel obstruction ?She is doing better this morning ?She has minimum pain ?She has passage of flatus ?No sensation of nausea ?She felt hungry ? ?Objective:  ?Vitals:  ? 11/07/21 0118 11/07/21 0551  ?BP: (!) 130/55 (!) 155/66  ?Pulse: 72 72  ?Resp: 18 18  ?Temp: 98.1 ?F (36.7 ?C) 98.1 ?F (36.7 ?C)  ?SpO2: 99% 98%  ?  ? ?Intake/Output Summary (Last 24 hours) at 11/07/2021 0819 ?Last data filed at 11/07/2021 0600 ?Gross per 24 hour  ?Intake 2001.98 ml  ?Output --  ?Net 2001.98 ml  ? ? ?GENERAL:alert, no distress and comfortable ?SKIN: skin color, texture, turgor are normal, no rashes or significant lesions ?EYES: normal, Conjunctiva are pink and non-injected,  sclera clear ?OROPHARYNX:no exudate, no erythema and lips, buccal mucosa, and tongue normal  ?NECK: supple, thyroid normal size, non-tender, without nodularity ?LYMPH:  no palpable lymphadenopathy in the cervical, axillary or  inguinal ?LUNGS: clear to auscultation and percussion with normal breathing effort ?HEART: regular rate & rhythm and no murmurs and no lower extremity edema ?ABDOMEN:abdomen soft, reduced bowel sounds throughout ?Musculoskeletal:no cyanosis of digits and no clubbing  ?NEURO: alert & oriented x 3 with fluent speech, no focal motor/sensory deficits ?  ?Labs:  ?Recent Labs  ?  10/28/21 ?1044 11/06/21 ?0612 11/07/21 ?0502  ?NA 136 138 136  ?K 4.3 3.8 3.8  ?CL 99 103 104  ?CO2 '30 25 26  '$ ?GLUCOSE 114* 111* 92  ?BUN '18 15 11  '$ ?CREATININE 1.14* 0.82 0.81  ?CALCIUM 9.9 9.5 9.3  ?GFRNONAA 49* >60 >60  ?PROT 7.0 6.6 6.0*  ?ALBUMIN 3.8 3.4* 3.1*  ?AST '19 18 16  '$ ?ALT '21 16 13  '$ ?ALKPHOS 111 91 88  ?BILITOT 0.3 0.7 0.7  ? ? ?Studies:  ?CT Abdomen Pelvis W Contrast ? ?Result Date: 11/06/2021 ?CLINICAL DATA:  Abdominal pain and vomiting EXAM: CT ABDOMEN AND PELVIS WITH CONTRAST TECHNIQUE: Multidetector CT imaging of the abdomen and pelvis was performed using the standard protocol following bolus administration of intravenous contrast. RADIATION DOSE REDUCTION: This exam was performed according to the departmental dose-optimization program which includes automated exposure control, adjustment of the mA and/or kV according to patient size and/or use of iterative reconstruction technique. CONTRAST:  150m OMNIPAQUE IOHEXOL 300 MG/ML  SOLN COMPARISON:  09/23/2021 FINDINGS: Study limited by technical issues. The scanner stopped twice during the study resulting in a fragmented exam into 3 parts. Lower chest: Small right pleural effusion with right lower lobe atelectasis. Hepatobiliary: No suspicious focal abnormality within the liver parenchyma. There is no evidence for gallstones, gallbladder wall thickening, or pericholecystic fluid. No intrahepatic or extrahepatic biliary dilation. Pancreas: No focal mass lesion. No dilatation of the main duct. No intraparenchymal cyst. No peripancreatic edema. Spleen: No splenomegaly. No focal mass  lesion. Adrenals/Urinary Tract: No adrenal nodule or mass. Right kidney unremarkable. Central sinus cysts noted left kidney. No evidence for hydroureter. The urinary bladder appears normal for the degree of distention. Stomach/Bowel: Stomach is unremarkable. No gastric wall thickening. No evidence of outlet obstruction. Duodenum is normally positioned as is the ligament of Treitz. Small bowel in the pelvis is fluid-filled and distended up to 2.7 cm. There is a segment in the anterior pelvis demonstrating fecalization of enteric contents and subtle adjacent edema in the subtending mesentery. No discrete transition zone evident. Distal ileum is decompressed. Terminal ileum unremarkable. The appendix is not well visualized, but there is no edema or inflammation in the region of the cecum. No gross colonic mass. No colonic wall thickening. Vascular/Lymphatic: There is moderate atherosclerotic calcification of the abdominal aorta without aneurysm. There is no gastrohepatic or hepatoduodenal ligament lymphadenopathy. No retroperitoneal or mesenteric lymphadenopathy. No pelvic sidewall lymphadenopathy. 15 mm short axis lymph node identified in the right groin is mildly enlarged, similar to prior. Reproductive: Uterus surgically absent. Pessary identified in the vagina. Other: Trace free fluid is seen in the pelvis. 4.7 x 1.4 cm right omental soft tissue identified previously is stable on 46/3 today. There is peritoneal thickening along the left paracolic gutter with additional scattered peritoneal and mesenteric nodules (left omentum 38/3, left para colonic on 48/3, and central mesentery on 54/3). Musculoskeletal: No worrisome lytic or sclerotic osseous abnormality. Superior endplate compression deformity noted  at L4 and L5. IMPRESSION: 1. Mildly distended fluid-filled small bowel in the pelvis with fecalization of enteric contents and subtle adjacent edema in the subtending mesentery. No discrete transition zone evident.  Imaging features are compatible with small bowel stricture or evolving in this patient with a reported history small bowel obstruction. 2. Stable appearance of the right omental soft tissue lesion with sca

## 2021-11-08 DIAGNOSIS — E039 Hypothyroidism, unspecified: Secondary | ICD-10-CM | POA: Diagnosis not present

## 2021-11-08 DIAGNOSIS — I10 Essential (primary) hypertension: Secondary | ICD-10-CM | POA: Diagnosis not present

## 2021-11-08 DIAGNOSIS — D539 Nutritional anemia, unspecified: Secondary | ICD-10-CM | POA: Diagnosis not present

## 2021-11-08 DIAGNOSIS — K56609 Unspecified intestinal obstruction, unspecified as to partial versus complete obstruction: Secondary | ICD-10-CM | POA: Diagnosis not present

## 2021-11-08 LAB — COMPREHENSIVE METABOLIC PANEL
ALT: 14 U/L (ref 0–44)
AST: 17 U/L (ref 15–41)
Albumin: 2.9 g/dL — ABNORMAL LOW (ref 3.5–5.0)
Alkaline Phosphatase: 82 U/L (ref 38–126)
Anion gap: 5 (ref 5–15)
BUN: 8 mg/dL (ref 8–23)
CO2: 26 mmol/L (ref 22–32)
Calcium: 9.2 mg/dL (ref 8.9–10.3)
Chloride: 106 mmol/L (ref 98–111)
Creatinine, Ser: 0.83 mg/dL (ref 0.44–1.00)
GFR, Estimated: >60 mL/min (ref >60)
Glucose, Bld: 105 mg/dL — ABNORMAL HIGH (ref 70–99)
Potassium: 3.8 mmol/L (ref 3.5–5.1)
Sodium: 137 mmol/L (ref 135–145)
Total Bilirubin: 0.7 mg/dL (ref 0.3–1.2)
Total Protein: 6 g/dL — ABNORMAL LOW (ref 6.5–8.1)

## 2021-11-08 LAB — RETICULOCYTES
Immature Retic Fract: 26.6 % — ABNORMAL HIGH (ref 2.3–15.9)
RBC.: 2.13 MIL/uL — ABNORMAL LOW (ref 3.87–5.11)
Retic Count, Absolute: 38.6 10*3/uL (ref 19.0–186.0)
Retic Ct Pct: 1.8 % (ref 0.4–3.1)

## 2021-11-08 LAB — CBC WITH DIFFERENTIAL/PLATELET
Abs Immature Granulocytes: 0.02 10*3/uL (ref 0.00–0.07)
Basophils Absolute: 0 10*3/uL (ref 0.0–0.1)
Basophils Relative: 0 %
Eosinophils Absolute: 0 10*3/uL (ref 0.0–0.5)
Eosinophils Relative: 0 %
HCT: 24.6 % — ABNORMAL LOW (ref 36.0–46.0)
Hemoglobin: 8.3 g/dL — ABNORMAL LOW (ref 12.0–15.0)
Immature Granulocytes: 1 %
Lymphocytes Relative: 54 %
Lymphs Abs: 1.2 10*3/uL (ref 0.7–4.0)
MCH: 39.2 pg — ABNORMAL HIGH (ref 26.0–34.0)
MCHC: 33.7 g/dL (ref 30.0–36.0)
MCV: 116 fL — ABNORMAL HIGH (ref 80.0–100.0)
Monocytes Absolute: 0.3 10*3/uL (ref 0.1–1.0)
Monocytes Relative: 11 %
Neutro Abs: 0.8 10*3/uL — ABNORMAL LOW (ref 1.7–7.7)
Neutrophils Relative %: 34 %
Platelets: 114 10*3/uL — ABNORMAL LOW (ref 150–400)
RBC: 2.12 MIL/uL — ABNORMAL LOW (ref 3.87–5.11)
RDW: 15.1 % (ref 11.5–15.5)
WBC: 2.3 10*3/uL — ABNORMAL LOW (ref 4.0–10.5)
nRBC: 0 % (ref 0.0–0.2)

## 2021-11-08 LAB — IRON AND TIBC
Iron: 100 ug/dL (ref 28–170)
Saturation Ratios: 30 % (ref 10.4–31.8)
TIBC: 338 ug/dL (ref 250–450)
UIBC: 238 ug/dL

## 2021-11-08 LAB — MAGNESIUM: Magnesium: 1.6 mg/dL — ABNORMAL LOW (ref 1.7–2.4)

## 2021-11-08 LAB — FERRITIN: Ferritin: 227 ng/mL (ref 11–307)

## 2021-11-08 LAB — VITAMIN B12: Vitamin B-12: 451 pg/mL (ref 180–914)

## 2021-11-08 LAB — PHOSPHORUS: Phosphorus: 3.3 mg/dL (ref 2.5–4.6)

## 2021-11-08 LAB — FOLATE: Folate: 19.9 ng/mL (ref >5.9)

## 2021-11-08 MED ORDER — BISACODYL 10 MG RE SUPP
10.0000 mg | Freq: Once | RECTAL | Status: AC
  Administered 2021-11-08: 10 mg via RECTAL
  Filled 2021-11-08: qty 1

## 2021-11-08 MED ORDER — MAGNESIUM SULFATE 50 % IJ SOLN
3.0000 g | Freq: Once | INTRAVENOUS | Status: AC
  Administered 2021-11-08: 3 g via INTRAVENOUS
  Filled 2021-11-08: qty 6

## 2021-11-08 MED ORDER — POLYETHYLENE GLYCOL 3350 17 G PO PACK
17.0000 g | PACK | Freq: Every day | ORAL | Status: DC
  Administered 2021-11-08 – 2021-11-09 (×2): 17 g via ORAL
  Filled 2021-11-08 (×2): qty 1

## 2021-11-08 MED ORDER — SENNOSIDES-DOCUSATE SODIUM 8.6-50 MG PO TABS
1.0000 | ORAL_TABLET | Freq: Every day | ORAL | Status: DC
  Administered 2021-11-08 – 2021-11-09 (×2): 1 via ORAL
  Filled 2021-11-08 (×2): qty 1

## 2021-11-08 NOTE — Progress Notes (Signed)
?PROGRESS NOTE ? ? ? ?Paula Peterson  ZOX:[id number] DOB: Oct 08, 1940 DOA: 11/06/2021 ?PCP: Caryl Bis, MD  ? ? ? ?Brief Narrative:  ?81 y.o. WF PMHx DVT/PE on Eliquis, ovarian cancer, HTN, hypothyroidism.  ? ?Presenting with abdominal pain. Symptoms started yesterday. She had sharp, epigastric pain. It came in episodes lasting 5 - 10 minutes. She tried her regular pain meds, but they didn't help. She then started having episodes of vomiting. She tried her some anti-emetics. They helped a little, but not much. She didn't have any fever or diarrhea. When her symptoms continued this morning, she became concerned and came to the ED for help. She denies any other aggravating or alleviating factors.   ? ? ?Subjective: ?5/13 afebrile overnight, A/O x4.  Tolerated clear liquid diet.  Positive flatulence, negative BM ? ? ?Assessment & Plan: ?Covid vaccination; ?  ?Principal Problem: ?  SBO (small bowel obstruction) ([place]) ?Active Problems: ?  Acquired hypothyroidism ?  PE (pulmonary thromboembolism) ([place]) ?  Essential hypertension ?  CKD (chronic kidney disease), stage III ([place]) ?  Obesity (BMI 30-39.9) ?  Macrocytic anemia ? ?SBO ?-N.p.o. ?- Antiemetics PRN ?-5/12 D5-0.9% saline 65m/hr ?-5/12 start clear liquid diet ?-5/13 advance diet to full liquid ?  ?Right ovarian epithelial cancer ([place] ?-s/p ex lap right salpingo-oophorectomy, omentectomy radical tumor debulking 06/06/2019 by Dr. RDenman George?- currently undergoing chemotherapy carboplatin + Doxil (last dose 10/28/21) with good response on CT scan from 08/2021), followed by Dr. GAlvy Bimler?-Her recent CT imaging in March showed positive response to treatment ?This is at the expense of severe pancytopenia with history of infection ?-Per EMR Dr. GAlvy Bimleroncology discussed the risk and benefits of continuing treatment with combination of carboplatin and Doxil, continuing treatment with Doxil only I will take a break from treatment ?After much discussion, she is in agreement to  proceed with combination of carboplatin and Doxil ?-Per EMR Dr. GAlvy Bimleroncology made patient aware about risk of cardiomyopathy with Doxil and risk of worsening pancytopenia and allergic reaction with carboplatin ?-Echocardiogram report was reviewed, without signs of cardiomyopathy ?-She received her last chemo treatment on 10/28/2021 ?-CT abdomen/pelvis performed this admission shows stable disease ?-Plan to continue outpatient chemo ?-Seen by Dr. NHeath Larkoncology: On board seeing patient. ?  ?Macrocytic anemia ?-5/12 anemia panel pending ? ?Thrombocytopenia ? Latest Reference Range & Units 10/28/21 10:44 11/06/21 06:12 11/07/21 05:02  ?Platelets 150 - 400 K/uL 125 (L) 124 (L) 119 (L)  ?(L): Data is abnormally low ?-Trending down, no signs of overt bleeding monitor closely ?  ?HTN ?-5/12 Atenolol 25 mg daily ?-Hydralazine PRN ? ?Hx PE ?- Restart home Eliquis dose 5 mg BID ? ?Hypothyroidism ?-Synthroid 75 mcg daily ? ?Obesity (BMI 34.11 kg/m?). ?-Address with PCP.  Patient with cancer  ? ?Hypomagnesmia ?- Magnesium goal> 2 ?- 5/13 Magnesium IV 3 g ?  ? ? ?Mobility Assessment (last 72 hours)   ? ? Mobility Assessment   ? ? RBurdetteName 11/07/21 0800 11/07/21 0700 11/06/21 1500  ?  ?  ? Does patient have an order for bedrest or is patient medically unstable No - Continue assessment No - Continue assessment No - Continue assessment    ? What is the highest level of mobility based on the progressive mobility assessment? Level 5 (Walks with assist in room/hall) - Balance while stepping forward/back and can walk in room with assist - Complete Level 5 (Walks with assist in room/hall) - Balance while stepping forward/back and can walk in  room with assist - Complete Level 5 (Walks with assist in room/hall) - Balance while stepping forward/back and can walk in room with assist - Complete    ? ?  ?  ? ?  ? ? ? ?'@IPAL'$ @ ? ?   ?DVT prophylaxis: Eliquis ?Code Status: DNR ?Family Communication:  ?Status is: Inpatient ? ? ? ?Dispo:  The patient is from: Home ?             Anticipated d/c is to: Home ?             Anticipated d/c date is: 3 days ?             Patient currently is not medically stable to d/c. ? ? ? ? ? ?Consultants:  ? ? ?Procedures/Significant Events:  ? ? ?I have personally reviewed and interpreted all radiology studies and my findings are as above. ? ?VENTILATOR SETTINGS: ? ? ? ?Cultures ? ? ?Antimicrobials: ?Anti-infectives (From admission, onward)  ? ? None  ? ?  ?  ? ? ?Devices ?  ? ?LINES / TUBES:  ? ? ? ? ?Continuous Infusions: ? dextrose 5 % and 0.9% NaCl 75 mL/hr at 11/08/21 0600  ? ? ? ?Objective: ?Vitals:  ? 11/07/21 0551 11/07/21 1329 11/07/21 2139 11/08/21 0550  ?BP: (!) 155/66 (!) 156/88 (!) 137/59 (!) 133/53  ?Pulse: 72 86 74 69  ?Resp: '18 16 16 16  '$ ?Temp: 98.1 ?F (36.7 ?C) 97.9 ?F (36.6 ?C) 97.7 ?F (36.5 ?C) 97.7 ?F (36.5 ?C)  ?TempSrc:  Oral Oral Oral  ?SpO2: 98% 99% 97% 96%  ?Weight:    96.7 kg  ?Height:      ? ? ?Intake/Output Summary (Last 24 hours) at 11/08/2021 1055 ?Last data filed at 11/08/2021 1000 ?Gross per 24 hour  ?Intake 2169.86 ml  ?Output 1750 ml  ?Net 419.86 ml  ? ? ?Filed Weights  ? 11/06/21 1500 11/08/21 0550  ?Weight: 98.8 kg 96.7 kg  ? ? ?Examination: ? ?General: A/O x4, No acute respiratory distress.  Sitting in chair ?Eyes: negative scleral hemorrhage, negative anisocoria, negative icterus ?ENT: Negative Runny nose, negative gingival bleeding, ?Neck:  Negative scars, masses, torticollis, lymphadenopathy, JVD ?Lungs: Clear to auscultation bilaterally without wheezes or crackles ?Cardiovascular: Regular rate and rhythm without murmur gallop or rub normal S1 and S2 ?Abdomen: negative abdominal pain, nondistended, positive soft, bowel sounds, no rebound, no ascites, no appreciable mass ?Extremities: No significant cyanosis, clubbing, or edema bilateral lower extremities ?Skin: Negative rashes, lesions, ulcers ?Psychiatric:  Negative depression, negative anxiety, negative fatigue, negative mania   ?Central nervous system:  Cranial nerves II through XII intact, tongue/uvula midline, all extremities muscle strength 5/5, sensation intact throughout, negative dysarthria, negative expressive aphasia, negative receptive aphasia. ? ?.  ? ? ? ?Data Reviewed: Care during the described time interval was provided by me .  I have reviewed this patient's available data, including medical history, events of note, physical examination, and all test results as part of my evaluation. ? ?CBC: ?Recent Labs  ?Lab 11/06/21 ?0612 11/07/21 ?0502 11/08/21 ?7741  ?WBC 4.1 2.5* 2.3*  ?NEUTROABS 2.7  --  0.8*  ?HGB 9.3* 8.4* 8.3*  ?HCT 27.9* 25.6* 24.6*  ?MCV 114.8* 116.9* 116.0*  ?PLT 124* 119* 114*  ? ? ?Basic Metabolic Panel: ?Recent Labs  ?Lab 11/06/21 ?0612 11/07/21 ?0502 11/08/21 ?2878  ?NA 138 136 137  ?K 3.8 3.8 3.8  ?CL 103 104 106  ?CO2 '25 26 26  '$ ?GLUCOSE 111* 92 105*  ?  BUN '15 11 8  '$ ?CREATININE 0.82 0.81 0.83  ?CALCIUM 9.5 9.3 9.2  ?MG  --   --  1.6*  ?PHOS  --   --  3.3  ? ? ?GFR: ?Estimated Creatinine Clearance: 66.7 mL/min (by C-G formula based on SCr of 0.83 mg/dL). ?Liver Function Tests: ?Recent Labs  ?Lab 11/06/21 ?0612 11/07/21 ?0502 11/08/21 ?2263  ?AST '18 16 17  '$ ?ALT '16 13 14  '$ ?ALKPHOS 91 88 82  ?BILITOT 0.7 0.7 0.7  ?PROT 6.6 6.0* 6.0*  ?ALBUMIN 3.4* 3.1* 2.9*  ? ? ?No results for input(s): LIPASE, AMYLASE in the last 168 hours. ?No results for input(s): AMMONIA in the last 168 hours. ?Coagulation Profile: ?Recent Labs  ?Lab 11/06/21 ?1715  ?INR 1.1  ? ? ?Cardiac Enzymes: ?No results for input(s): CKTOTAL, CKMB, CKMBINDEX, TROPONINI in the last 168 hours. ?BNP (last 3 results) ?No results for input(s): PROBNP in the last 8760 hours. ?HbA1C: ?No results for input(s): HGBA1C in the last 72 hours. ?CBG: ?No results for input(s): GLUCAP in the last 168 hours. ?Lipid Profile: ?No results for input(s): CHOL, HDL, LDLCALC, TRIG, CHOLHDL, LDLDIRECT in the last 72 hours. ?Thyroid Function Tests: ?No results for input(s):  TSH, T4TOTAL, FREET4, T3FREE, THYROIDAB in the last 72 hours. ?Anemia Panel: ?Recent Labs  ?  11/08/21 ?3354  ?VITAMINB12 451  ?FOLATE 19.9  ?FERRITIN 227  ?TIBC 338  ?IRON 100  ?RETICCTPCT 1.8  ? ?Sepsis Labs

## 2021-11-08 NOTE — Progress Notes (Signed)
? ?  Subjective/Chief Complaint: ?Feels well today ?Passing flatus ?No bm yet ?Denies nausea or bloating ? ? ?Objective: ?Vital signs in last 24 hours: ?Temp:  [97.7 ?F (36.5 ?C)-97.9 ?F (36.6 ?C)] 97.7 ?F (36.5 ?C) (05/13 0550) ?Pulse Rate:  [69-86] 69 (05/13 0550) ?Resp:  [16] 16 (05/13 0550) ?BP: (133-156)/(53-88) 133/53 (05/13 0550) ?SpO2:  [96 %-99 %] 96 % (05/13 0550) ?Weight:  [96.7 kg] 96.7 kg (05/13 0550) ?Last BM Date : 11/05/21 ? ?Intake/Output from previous day: ?05/12 0701 - 05/13 0700 ?In: 2532.1 [P.O.:720; I.V.:1812.1] ?Out: [place] [[id number]] ?Intake/Output this shift: ?No intake/output data recorded. ? ?Exam: ?Awake and alert ?Abdomen soft, non-distended, non-tender ? ?Lab Results:  ?Recent Labs  ?  11/07/21 ?0502 11/08/21 ?7741  ?WBC 2.5* 2.3*  ?HGB 8.4* 8.3*  ?HCT 25.6* 24.6*  ?PLT 119* 114*  ? ?BMET ?Recent Labs  ?  11/07/21 ?0502 11/08/21 ?2878  ?NA 136 137  ?K 3.8 3.8  ?CL 104 106  ?CO2 26 26  ?GLUCOSE 92 105*  ?BUN 11 8  ?CREATININE 0.81 0.83  ?CALCIUM 9.3 9.2  ? ?PT/INR ?Recent Labs  ?  11/06/21 ?1715  ?LABPROT 13.7  ?INR 1.1  ? ?ABG ?No results for input(s): PHART, HCO3 in the last 72 hours. ? ?Invalid input(s): PCO2, PO2 ? ?Studies/Results: ?DG Abd Portable 1V ? ?Result Date: 11/07/2021 ?CLINICAL DATA:  Small bowel obstruction. EXAM: PORTABLE ABDOMEN - 1 VIEW COMPARISON:  06/28/2021 FINDINGS: Bowel gas pattern is nonobstructive. There is no free peritoneal air. Single surgical clip over the mid abdomen. Degenerative changes of the spine and hips. IMPRESSION: Nonobstructive bowel gas pattern. Electronically Signed   By: Marin Olp M.D.   On: 11/07/2021 07:59   ? ?Anti-infectives: ?Anti-infectives (From admission, onward)  ? ? None  ? ?  ? ? ?Assessment/Plan: ?SBO, ?small bowel stricture ?Hx Ovarian cancer with carcinomatosis  ?Ovarian cancer with carcinomatosis ?-s/p ex lap right salpingo-oophorectomy, omentectomy radical tumor debulking 06/06/2019 by Dr. Denman George ?- currently undergoing  chemotherapy carboplatin + Doxil (last dose 10/28/21) with good response on CT scan from 08/2021), followed by Dr. Alvy Bimler ?H/o DVT/PE on eliquis indefinitely  ?CKD ?Pancytopenia ?Hypothyroidism ?GERD ?HTN ? ? ?Clinically continuing to improve.  Will advance to full liquids ? ?Moderate medical decision making ? ?Coralie Keens MD ?11/08/2021 ? ?

## 2021-11-09 DIAGNOSIS — D539 Nutritional anemia, unspecified: Secondary | ICD-10-CM | POA: Diagnosis not present

## 2021-11-09 DIAGNOSIS — E039 Hypothyroidism, unspecified: Secondary | ICD-10-CM | POA: Diagnosis not present

## 2021-11-09 DIAGNOSIS — I10 Essential (primary) hypertension: Secondary | ICD-10-CM | POA: Diagnosis not present

## 2021-11-09 DIAGNOSIS — K56609 Unspecified intestinal obstruction, unspecified as to partial versus complete obstruction: Secondary | ICD-10-CM | POA: Diagnosis not present

## 2021-11-09 LAB — PHOSPHORUS: Phosphorus: 3.6 mg/dL (ref 2.5–4.6)

## 2021-11-09 LAB — CBC WITH DIFFERENTIAL/PLATELET
Abs Immature Granulocytes: 0.01 10*3/uL (ref 0.00–0.07)
Basophils Absolute: 0 10*3/uL (ref 0.0–0.1)
Basophils Relative: 0 %
Eosinophils Absolute: 0 10*3/uL (ref 0.0–0.5)
Eosinophils Relative: 0 %
HCT: 24.2 % — ABNORMAL LOW (ref 36.0–46.0)
Hemoglobin: 8.2 g/dL — ABNORMAL LOW (ref 12.0–15.0)
Immature Granulocytes: 0 %
Lymphocytes Relative: 42 %
Lymphs Abs: 1.6 10*3/uL (ref 0.7–4.0)
MCH: 39 pg — ABNORMAL HIGH (ref 26.0–34.0)
MCHC: 33.9 g/dL (ref 30.0–36.0)
MCV: 115.2 fL — ABNORMAL HIGH (ref 80.0–100.0)
Monocytes Absolute: 0.4 10*3/uL (ref 0.1–1.0)
Monocytes Relative: 11 %
Neutro Abs: 1.7 10*3/uL (ref 1.7–7.7)
Neutrophils Relative %: 47 %
Platelets: 103 10*3/uL — ABNORMAL LOW (ref 150–400)
RBC: 2.1 MIL/uL — ABNORMAL LOW (ref 3.87–5.11)
RDW: 15.3 % (ref 11.5–15.5)
WBC: 3.8 10*3/uL — ABNORMAL LOW (ref 4.0–10.5)
nRBC: 0 % (ref 0.0–0.2)

## 2021-11-09 LAB — COMPREHENSIVE METABOLIC PANEL
ALT: 15 U/L (ref 0–44)
AST: 18 U/L (ref 15–41)
Albumin: 3 g/dL — ABNORMAL LOW (ref 3.5–5.0)
Alkaline Phosphatase: 84 U/L (ref 38–126)
Anion gap: 5 (ref 5–15)
BUN: 7 mg/dL — ABNORMAL LOW (ref 8–23)
CO2: 26 mmol/L (ref 22–32)
Calcium: 9.4 mg/dL (ref 8.9–10.3)
Chloride: 108 mmol/L (ref 98–111)
Creatinine, Ser: 0.91 mg/dL (ref 0.44–1.00)
GFR, Estimated: >60 mL/min (ref >60)
Glucose, Bld: 105 mg/dL — ABNORMAL HIGH (ref 70–99)
Potassium: 3.9 mmol/L (ref 3.5–5.1)
Sodium: 139 mmol/L (ref 135–145)
Total Bilirubin: 0.6 mg/dL (ref 0.3–1.2)
Total Protein: 6 g/dL — ABNORMAL LOW (ref 6.5–8.1)

## 2021-11-09 LAB — MAGNESIUM: Magnesium: 1.8 mg/dL (ref 1.7–2.4)

## 2021-11-09 NOTE — Progress Notes (Signed)
?PROGRESS NOTE ? ? ? ?Paula Peterson  TDV:[id number] DOB: April 02, 1941 DOA: 11/06/2021 ?PCP: Caryl Bis, MD  ? ? ? ?Brief Narrative:  ?81 y.o. WF PMHx DVT/PE on Eliquis, ovarian cancer, HTN, hypothyroidism.  ? ?Presenting with abdominal pain. Symptoms started yesterday. She had sharp, epigastric pain. It came in episodes lasting 5 - 10 minutes. She tried her regular pain meds, but they didn't help. She then started having episodes of vomiting. She tried her some anti-emetics. They helped a little, but not much. She didn't have any fever or diarrhea. When her symptoms continued this morning, she became concerned and came to the ED for help. She denies any other aggravating or alleviating factors.   ? ? ?Subjective: ?5/14 A/O x4.  Tolerated full liquid diet.  Positive flatulence ? ? ?Assessment & Plan: ?Covid vaccination; ?  ?Principal Problem: ?  SBO (small bowel obstruction) ([place]) ?Active Problems: ?  Acquired hypothyroidism ?  PE (pulmonary thromboembolism) ([place]) ?  Essential hypertension ?  CKD (chronic kidney disease), stage III ([place]) ?  Obesity (BMI 30-39.9) ?  Macrocytic anemia ? ?SBO ?-N.p.o. ?- Antiemetics PRN ?-5/12 D5-0.9% saline 35m/hr ?-5/12 start clear liquid diet ?-5/13 advance diet to full liquid ?-5/14 advance diet to bland. ?  ?[place] ?-s/p ex lap right salpingo-oophorectomy, omentectomy radical tumor debulking 06/06/2019 by Dr. RDenman George?- currently undergoing chemotherapy carboplatin + Doxil (last dose 10/28/21) with good response on CT scan from 08/2021), followed by Dr. GAlvy Bimler?-Her recent CT imaging in March showed positive response to treatment ?This is at the expense of severe pancytopenia with history of infection ?-Per EMR Dr. GAlvy Bimleroncology discussed the risk and benefits of continuing treatment with combination of carboplatin and Doxil, continuing treatment with Doxil only I will take a break from treatment ?After much discussion, she is in agreement to proceed  with combination of carboplatin and Doxil ?-Per EMR Dr. GAlvy Bimleroncology made patient aware about risk of cardiomyopathy with Doxil and risk of worsening pancytopenia and allergic reaction with carboplatin ?-Echocardiogram report was reviewed, without signs of cardiomyopathy ?-She received her last chemo treatment on 10/28/2021 ?-CT abdomen/pelvis performed this admission shows stable disease ?-Plan to continue outpatient chemo ?-Seen by Dr. NHeath Larkoncology: On board seeing patient. ?  ?Macrocytic anemia ?-5/12 anemia panel consistent with macrocytic anemia. ? ?Thrombocytopenia ? Latest Reference Range & Units 10/28/21 10:44 11/06/21 06:12 11/07/21 05:02 11/08/21 04:55 11/09/21 03:06  ?Platelets 150 - 400 K/uL 125 (L) 124 (L) 119 (L) 114 (L) 103 (L)  ?(L): Data is abnormally low ?-Trending down, no signs of overt bleeding monitor closely ?  ?HTN ?-5/12 Atenolol 25 mg daily ?-Hydralazine PRN ? ?Hx PE ?- Restart home Eliquis dose 5 mg BID ? ?Hypothyroidism ?-Synthroid 75 mcg daily ? ?Obesity (BMI 34.11 kg/m?). ?-Address with PCP.  Patient with cancer  ? ?Hypomagnesmia ?- Magnesium goal> 2 ?- 5/13 Magnesium IV 3 g ?  ? ? ?Mobility Assessment (last 72 hours)   ? ? Mobility Assessment   ? ? ROgdenName 11/09/21 0900 11/08/21 2200 11/07/21 0800 11/07/21 0700 11/06/21 1500  ? Does patient have an order for bedrest or is patient medically unstable No - Continue assessment No - Continue assessment No - Continue assessment No - Continue assessment No - Continue assessment  ? What is the highest level of mobility based on the progressive mobility assessment? Level 5 (Walks with assist in room/hall) - Balance while stepping forward/back and can walk in room with assist -  Complete Level 5 (Walks with assist in room/hall) - Balance while stepping forward/back and can walk in room with assist - Complete Level 5 (Walks with assist in room/hall) - Balance while stepping forward/back and can walk in room with assist - Complete Level 5  (Walks with assist in room/hall) - Balance while stepping forward/back and can walk in room with assist - Complete Level 5 (Walks with assist in room/hall) - Balance while stepping forward/back and can walk in room with assist - Complete  ? ?  ?  ? ?  ? ? ? ?'@IPAL'$ @ ? ?   ?DVT prophylaxis: Eliquis ?Code Status: DNR ?Family Communication: 5/14 daughter at bedside for discussion plan of care all questions answered ?Status is: Inpatient ? ? ? ?Dispo: The patient is from: Home ?             Anticipated d/c is to: Home ?             Anticipated d/c date is: 3 days ?             Patient currently is not medically stable to d/c. ? ? ? ? ? ?Consultants:  ? ? ?Procedures/Significant Events:  ? ? ?I have personally reviewed and interpreted all radiology studies and my findings are as above. ? ?VENTILATOR SETTINGS: ? ? ? ?Cultures ? ? ?Antimicrobials: ?Anti-infectives (From admission, onward)  ? ? None  ? ?  ?  ? ? ?Devices ?  ? ?LINES / TUBES:  ? ? ? ? ?Continuous Infusions: ? dextrose 5 % and 0.9% NaCl 75 mL/hr at 11/08/21 0600  ? ? ? ?Objective: ?Vitals:  ? 11/08/21 0550 11/08/21 1331 11/08/21 2124 11/09/21 0533  ?BP: (!) 133/53 123/60 (!) 145/57 (!) 147/66  ?Pulse: 69 73 74 74  ?Resp: '16 16 14 16  '$ ?Temp: 97.7 ?F (36.5 ?C) 97.9 ?F (36.6 ?C) 98.1 ?F (36.7 ?C) 98.1 ?F (36.7 ?C)  ?TempSrc: Oral Oral  Oral  ?SpO2: 96% 100% 100% 98%  ?Weight: 96.7 kg     ?Height:      ? ? ?Intake/Output Summary (Last 24 hours) at 11/09/2021 1011 ?Last data filed at 11/09/2021 0600 ?Gross per 24 hour  ?Intake 1147.21 ml  ?Output --  ?Net 1147.21 ml  ? ? ?Filed Weights  ? 11/06/21 1500 11/08/21 0550  ?Weight: 98.8 kg 96.7 kg  ? ? ?Examination: ? ?General: A/O x4, No acute respiratory distress.  Sitting in chair ?Eyes: negative scleral hemorrhage, negative anisocoria, negative icterus ?ENT: Negative Runny nose, negative gingival bleeding, ?Neck:  Negative scars, masses, torticollis, lymphadenopathy, JVD ?Lungs: Clear to auscultation bilaterally  without wheezes or crackles ?Cardiovascular: Regular rate and rhythm without murmur gallop or rub normal S1 and S2 ?Abdomen: negative abdominal pain, nondistended, positive soft, bowel sounds, no rebound, no ascites, no appreciable mass ?Extremities: No significant cyanosis, clubbing, or edema bilateral lower extremities ?Skin: Negative rashes, lesions, ulcers ?Psychiatric:  Negative depression, negative anxiety, negative fatigue, negative mania  ?Central nervous system:  Cranial nerves II through XII intact, tongue/uvula midline, all extremities muscle strength 5/5, sensation intact throughout, negative dysarthria, negative expressive aphasia, negative receptive aphasia. ? ?.  ? ? ? ?Data Reviewed: Care during the described time interval was provided by me .  I have reviewed this patient's available data, including medical history, events of note, physical examination, and all test results as part of my evaluation. ? ?CBC: ?Recent Labs  ?Lab 11/06/21 ?0612 11/07/21 ?0502 11/08/21 ?6759 11/09/21 ?1638  ?WBC 4.1 2.5* 2.3* 3.8*  ?  NEUTROABS 2.7  --  0.8* 1.7  ?HGB 9.3* 8.4* 8.3* 8.2*  ?HCT 27.9* 25.6* 24.6* 24.2*  ?MCV 114.8* 116.9* 116.0* 115.2*  ?PLT 124* 119* 114* 103*  ? ? ?Basic Metabolic Panel: ?Recent Labs  ?Lab 11/06/21 ?0612 11/07/21 ?0502 11/08/21 ?2947 11/09/21 ?6546  ?NA 138 136 137 139  ?K 3.8 3.8 3.8 3.9  ?CL 103 104 106 108  ?CO2 '25 26 26 26  '$ ?GLUCOSE 111* 92 105* 105*  ?BUN '15 11 8 '$ 7*  ?CREATININE 0.82 0.81 0.83 0.91  ?CALCIUM 9.5 9.3 9.2 9.4  ?MG  --   --  1.6* 1.8  ?PHOS  --   --  3.3 3.6  ? ? ?GFR: ?Estimated Creatinine Clearance: 60.8 mL/min (by C-G formula based on SCr of 0.91 mg/dL). ?Liver Function Tests: ?Recent Labs  ?Lab 11/06/21 ?0612 11/07/21 ?0502 11/08/21 ?5035 11/09/21 ?4656  ?AST '18 16 17 18  '$ ?ALT '16 13 14 15  '$ ?ALKPHOS 91 88 82 84  ?BILITOT 0.7 0.7 0.7 0.6  ?PROT 6.6 6.0* 6.0* 6.0*  ?ALBUMIN 3.4* 3.1* 2.9* 3.0*  ? ? ?No results for input(s): LIPASE, AMYLASE in the last 168 hours. ?No  results for input(s): AMMONIA in the last 168 hours. ?Coagulation Profile: ?Recent Labs  ?Lab 11/06/21 ?1715  ?INR 1.1  ? ? ?Cardiac Enzymes: ?No results for input(s): CKTOTAL, CKMB, CKMBINDEX, TROPONINI in t

## 2021-11-09 NOTE — Plan of Care (Signed)

## 2021-11-09 NOTE — Progress Notes (Signed)
? ?  Subjective/Chief Complaint: ?Tolerated full liquids without pain/nausea ?Had small bm this morning ? ? ?Objective: ?Vital signs in last 24 hours: ?Temp:  [97.9 ?F (36.6 ?C)-98.1 ?F (36.7 ?C)] 98.1 ?F (36.7 ?C) (05/14 0533) ?Pulse Rate:  [73-74] 74 (05/14 0533) ?Resp:  [14-16] 16 (05/14 0533) ?BP: (123-147)/(57-66) 147/66 (05/14 0533) ?SpO2:  [98 %-100 %] 98 % (05/14 0533) ?Last BM Date : 11/08/21 ? ?Intake/Output from previous day: ?05/13 0701 - 05/14 0700 ?In: 1687.4 [P.O.:1080; I.V.:501.4; IV Piggyback:106] ?Out: -  ?Intake/Output this shift: ?No intake/output data recorded. ? ?Exam: ?Comfortable ?Abdomen soft, NT/ND ? ?Lab Results:  ?Recent Labs  ?  11/08/21 ?3354 11/09/21 ?0306  ?WBC 2.3* 3.8*  ?HGB 8.3* 8.2*  ?HCT 24.6* 24.2*  ?PLT 114* 103*  ? ?BMET ?Recent Labs  ?  11/08/21 ?0455 11/09/21 ?0306  ?NA 137 139  ?K 3.8 3.9  ?CL 106 108  ?CO2 26 26  ?GLUCOSE 105* 105*  ?BUN 8 7*  ?CREATININE 0.83 0.91  ?CALCIUM 9.2 9.4  ? ?PT/INR ?Recent Labs  ?  11/06/21 ?1715  ?LABPROT 13.7  ?INR 1.1  ? ?ABG ?No results for input(s): PHART, HCO3 in the last 72 hours. ? ?Invalid input(s): PCO2, PO2 ? ?Studies/Results: ?No results found. ? ?Anti-infectives: ?Anti-infectives (From admission, onward)  ? ? None  ? ?  ? ? ?Assessment/Plan: ?SBO, ?small bowel stricture ?Hx Ovarian cancer with carcinomatosis  ?Ovarian cancer with carcinomatosis ?-s/p ex lap right salpingo-oophorectomy, omentectomy radical tumor debulking 06/06/2019 by Dr. Denman George ?- currently undergoing chemotherapy carboplatin + Doxil (last dose 10/28/21) with good response on CT scan from 08/2021), followed by Dr. Alvy Bimler ?H/o DVT/PE on eliquis indefinitely  ?CKD ?Pancytopenia ?Hypothyroidism ?GERD ?HTN ? ?Advancing to soft diet ? ? ? ? ?Coralie Keens MD ?11/09/2021 ? ?

## 2021-11-10 ENCOUNTER — Other Ambulatory Visit (HOSPITAL_COMMUNITY): Payer: Self-pay

## 2021-11-10 ENCOUNTER — Encounter: Payer: Self-pay | Admitting: Hematology and Oncology

## 2021-11-10 DIAGNOSIS — I10 Essential (primary) hypertension: Secondary | ICD-10-CM | POA: Diagnosis not present

## 2021-11-10 DIAGNOSIS — N1832 Chronic kidney disease, stage 3b: Secondary | ICD-10-CM | POA: Diagnosis not present

## 2021-11-10 DIAGNOSIS — D539 Nutritional anemia, unspecified: Secondary | ICD-10-CM | POA: Diagnosis not present

## 2021-11-10 DIAGNOSIS — K56609 Unspecified intestinal obstruction, unspecified as to partial versus complete obstruction: Secondary | ICD-10-CM | POA: Diagnosis not present

## 2021-11-10 DIAGNOSIS — L0390 Cellulitis, unspecified: Secondary | ICD-10-CM | POA: Diagnosis present

## 2021-11-10 DIAGNOSIS — E039 Hypothyroidism, unspecified: Secondary | ICD-10-CM | POA: Diagnosis not present

## 2021-11-10 LAB — COMPREHENSIVE METABOLIC PANEL
ALT: 22 U/L (ref 0–44)
AST: 24 U/L (ref 15–41)
Albumin: 3.1 g/dL — ABNORMAL LOW (ref 3.5–5.0)
Alkaline Phosphatase: 85 U/L (ref 38–126)
Anion gap: 6 (ref 5–15)
BUN: 10 mg/dL (ref 8–23)
CO2: 26 mmol/L (ref 22–32)
Calcium: 9.2 mg/dL (ref 8.9–10.3)
Chloride: 103 mmol/L (ref 98–111)
Creatinine, Ser: 0.86 mg/dL (ref 0.44–1.00)
GFR, Estimated: >60 mL/min (ref >60)
Glucose, Bld: 105 mg/dL — ABNORMAL HIGH (ref 70–99)
Potassium: 3.8 mmol/L (ref 3.5–5.1)
Sodium: 135 mmol/L (ref 135–145)
Total Bilirubin: 0.6 mg/dL (ref 0.3–1.2)
Total Protein: 6.1 g/dL — ABNORMAL LOW (ref 6.5–8.1)

## 2021-11-10 LAB — CBC WITH DIFFERENTIAL/PLATELET
Abs Immature Granulocytes: 0.01 10*3/uL (ref 0.00–0.07)
Basophils Absolute: 0 10*3/uL (ref 0.0–0.1)
Basophils Relative: 0 %
Eosinophils Absolute: 0 10*3/uL (ref 0.0–0.5)
Eosinophils Relative: 1 %
HCT: 23.3 % — ABNORMAL LOW (ref 36.0–46.0)
Hemoglobin: 8 g/dL — ABNORMAL LOW (ref 12.0–15.0)
Immature Granulocytes: 0 %
Lymphocytes Relative: 44 %
Lymphs Abs: 1.3 10*3/uL (ref 0.7–4.0)
MCH: 39.6 pg — ABNORMAL HIGH (ref 26.0–34.0)
MCHC: 34.3 g/dL (ref 30.0–36.0)
MCV: 115.3 fL — ABNORMAL HIGH (ref 80.0–100.0)
Monocytes Absolute: 0.4 10*3/uL (ref 0.1–1.0)
Monocytes Relative: 12 %
Neutro Abs: 1.3 10*3/uL — ABNORMAL LOW (ref 1.7–7.7)
Neutrophils Relative %: 43 %
Platelets: 92 10*3/uL — ABNORMAL LOW (ref 150–400)
RBC: 2.02 MIL/uL — ABNORMAL LOW (ref 3.87–5.11)
RDW: 15.1 % (ref 11.5–15.5)
WBC: 3 10*3/uL — ABNORMAL LOW (ref 4.0–10.5)
nRBC: 0 % (ref 0.0–0.2)

## 2021-11-10 LAB — PHOSPHORUS: Phosphorus: 3.9 mg/dL (ref 2.5–4.6)

## 2021-11-10 LAB — MAGNESIUM: Magnesium: 1.7 mg/dL (ref 1.7–2.4)

## 2021-11-10 MED ORDER — OXYCODONE HCL 5 MG PO TABS: 5.0000 mg | ORAL_TABLET | Freq: Four times a day (QID) | ORAL | Status: DC | PRN

## 2021-11-10 MED ORDER — DOXYCYCLINE HYCLATE 100 MG PO TABS
100.0000 mg | ORAL_TABLET | Freq: Two times a day (BID) | ORAL | Status: DC
  Administered 2021-11-10: 100 mg via ORAL
  Filled 2021-11-10: qty 1

## 2021-11-10 MED ORDER — DOXYCYCLINE HYCLATE 100 MG PO TABS
100.0000 mg | ORAL_TABLET | Freq: Two times a day (BID) | ORAL | 0 refills | Status: DC
  Filled 2021-11-10: qty 30, 15d supply, fill #0

## 2021-11-10 MED ORDER — DOXYCYCLINE HYCLATE 100 MG PO TABS
100.0000 mg | ORAL_TABLET | Freq: Two times a day (BID) | ORAL | 0 refills | Status: DC
Start: 2021-11-10 — End: 2021-12-08

## 2021-11-10 MED ORDER — ACETAMINOPHEN 325 MG PO TABS
650.0000 mg | ORAL_TABLET | Freq: Four times a day (QID) | ORAL | Status: DC | PRN
  Administered 2021-11-10: 650 mg via ORAL
  Filled 2021-11-10: qty 2

## 2021-11-10 NOTE — Progress Notes (Signed)
Pt was discharged home today. Instructions were reviewed with patient,prescriptions were given to patient and questions were answered. Pt was taken to main entrance via wheelchair by NT.  

## 2021-11-10 NOTE — Plan of Care (Signed)

## 2021-11-10 NOTE — Progress Notes (Signed)
? ?  Subjective/Chief Complaint: ?Tolerating PO. Had 2 BMs in the last 24 hours. Ongoing flatus. Eager to go home.  ? ? ?Objective: ?Vital signs in last 24 hours: ?Temp:  [97.7 ?F (36.5 ?C)-97.8 ?F (36.6 ?C)] 97.7 ?F (36.5 ?C) (05/15 0510) ?Pulse Rate:  [69-73] 69 (05/15 0510) ?Resp:  [14-18] 18 (05/15 0510) ?BP: (109-144)/(50-61) 109/50 (05/15 0510) ?SpO2:  [97 %-100 %] 97 % (05/15 0510) ?Last BM Date : 11/08/21 ? ?Intake/Output from previous day: ?05/14 0701 - 05/15 0700 ?In: 989.7 [P.O.:720; I.V.:269.7] ?Out: 450 [Urine:450] ?Intake/Output this shift: ?No intake/output data recorded. ? ?Exam: ?Comfortable ?Abdomen soft, NT/ND ? ?Lab Results:  ?Recent Labs  ?  11/09/21 ?0306 11/10/21 ?0309  ?WBC 3.8* 3.0*  ?HGB 8.2* 8.0*  ?HCT 24.2* 23.3*  ?PLT 103* 92*  ? ?BMET ?Recent Labs  ?  11/09/21 ?0306 11/10/21 ?0309  ?NA 139 135  ?K 3.9 3.8  ?CL 108 103  ?CO2 26 26  ?GLUCOSE 105* 105*  ?BUN 7* 10  ?CREATININE 0.91 0.86  ?CALCIUM 9.4 9.2  ? ?PT/INR ?No results for input(s): LABPROT, INR in the last 72 hours. ? ?ABG ?No results for input(s): PHART, HCO3 in the last 72 hours. ? ?Invalid input(s): PCO2, PO2 ? ?Studies/Results: ?No results found. ? ?Anti-infectives: ?Anti-infectives (From admission, onward)  ? ? None  ? ?  ? ? ?Assessment/Plan: ?SBO, ?small bowel stricture ?Hx Ovarian cancer with carcinomatosis  ?Ovarian cancer with carcinomatosis ?-s/p ex lap right salpingo-oophorectomy, omentectomy radical tumor debulking 06/06/2019 by Dr. Denman George ?- currently undergoing chemotherapy carboplatin + Doxil (last dose 10/28/21) with good response on CT scan from 08/2021), followed by Dr. Alvy Bimler ?H/o DVT/PE on eliquis indefinitely  ?CKD ?Pancytopenia ?Hypothyroidism ?GERD ?HTN ? ?Ongoing improvement. No surgical needs. General surgery will sign off, please call as needed.  ? ? ? ? ?Jill Alexanders PA-C ?11/10/2021 ? ?

## 2021-11-10 NOTE — Progress Notes (Addendum)
Paula Peterson   DOB:05-12-1941   IO#:[id number]   ? ?ASSESSMENT & PLAN:  ? ?Right ovarian epithelial cancer ([place]) ?Her recent CT imaging in March showed positive response to treatment ?This is at the expense of severe pancytopenia with history of infection ?Moving forward, we discussed the risk and benefits of continuing treatment with combination of carboplatin and Doxil, continuing treatment with Doxil only I will take a break from treatment ?After much discussion, she is in agreement to proceed with combination of carboplatin and Doxil ?She is made aware about risk of cardiomyopathy with Doxil and risk of worsening pancytopenia and allergic reaction with carboplatin ?Echocardiogram report was reviewed, without signs of cardiomyopathy ?She received her last treatment on 10/28/2021 ?CT abdomen/pelvis performed this admission shows stable disease ?Plan to continue outpatient chemo ?Continue supportive care ?  ?Abdominal pain, nausea, vomiting,?  Stricture versus SBO ?Abdominal pain and vomiting improved after receiving pain medication and antiemetics ?Has been seen by general surgery who is recommended NG tube placement and bowel rest ?NG tube currently on hold per patient request ?We do not feel strongly that NG tube needs to be placed if she is no longer having abdominal pain, nausea, vomiting ?I do not believe this is due to disease progression ?She had similar hospitalization earlier this year related to stricture that resolved conservatively ?Abdominal pain, nausea, vomiting have resolved ?Bowels move this morning ?Tolerating regular diet ?  ?Pancytopenia, acquired ([place]) ?She has pancytopenia due to recent treatment and mild hemodilution ?White blood is mildly decreased ?Her hemoglobin is stable at 8.0 today and platelets are mildly low at 92,000 ?Continue to monitor ?There is no contraindication to remain on antiplatelet agents or anticoagulants as long as the platelet is greater than 50,000 ?  ?PE (pulmonary  thromboembolism) ([place]) ?She had minimal bleeding ?She will continue anticoagulation therapy indefinitely ?There is no contraindication to remain on antiplatelet agents or anticoagulants as long as the platelet is greater than 50,000. ?  ?CKD (chronic kidney disease), stage III ([place]), stable ?She has mild intermittent elevated serum creatinine ?  ?Discharge planning ?Abdominal pain, nausea, vomiting have resolved.  Bowels are moving.  She is tolerating a regular diet.  She will be discharged home later today.  Okay to discharge from our standpoint. She will keep outpatient follow-up in our office as previously scheduled. ? ?All questions were answered. The patient knows to call the clinic with any problems, questions or concerns. ?  ?Mikey Bussing, NP ?11/10/2021 10:06 AM ?[name], MD ? ?Subjective:  ?Feels well this morning, wants to go home ?Denies abdominal pain, nausea, vomiting ?Bowels move this morning ?Reports a small "boil" on her buttocks which has been recurrent and requesting antibiotic that she is received in the past ? ?Objective:  ?Vitals:  ? 11/09/21 2108 11/10/21 0510  ?BP: (!) 144/61 (!) 109/50  ?Pulse: 73 69  ?Resp: 18 18  ?Temp: 97.8 ?F (36.6 ?C) 97.7 ?F (36.5 ?C)  ?SpO2: 100% 97%  ?  ? ?Intake/Output Summary (Last 24 hours) at 11/10/2021 1006 ?Last data filed at 11/10/2021 0500 ?Gross per 24 hour  ?Intake 669.88 ml  ?Output 450 ml  ?Net 219.88 ml  ? ? ?GENERAL:alert, no distress and comfortable ?NEURO: alert & oriented x 3 with fluent speech, no focal motor/sensory deficits ?  ?Labs:  ?Recent Labs  ?  11/08/21 ?3244 11/09/21 ?0306 11/10/21 ?0309  ?NA 137 139 135  ?K 3.8 3.9 3.8  ?CL 106 108 103  ?CO2 '26 26 26  '$ ?GLUCOSE  105* 105* 105*  ?BUN 8 7* 10  ?CREATININE 0.83 0.91 0.86  ?CALCIUM 9.2 9.4 9.2  ?GFRNONAA >60 >60 >60  ?PROT 6.0* 6.0* 6.1*  ?ALBUMIN 2.9* 3.0* 3.1*  ?AST '17 18 24  '$ ?ALT '14 15 22  '$ ?ALKPHOS 82 84 85  ?BILITOT 0.7 0.6 0.6  ? ? ?Studies:  ?CT Abdomen Pelvis W Contrast ? ?Result Date:  11/06/2021 ?CLINICAL DATA:  Abdominal pain and vomiting EXAM: CT ABDOMEN AND PELVIS WITH CONTRAST TECHNIQUE: Multidetector CT imaging of the abdomen and pelvis was performed using the standard protocol following bolus administration of intravenous contrast. RADIATION DOSE REDUCTION: This exam was performed according to the departmental dose-optimization program which includes automated exposure control, adjustment of the mA and/or kV according to patient size and/or use of iterative reconstruction technique. CONTRAST:  124m OMNIPAQUE IOHEXOL 300 MG/ML  SOLN COMPARISON:  09/23/2021 FINDINGS: Study limited by technical issues. The scanner stopped twice during the study resulting in a fragmented exam into 3 parts. Lower chest: Small right pleural effusion with right lower lobe atelectasis. Hepatobiliary: No suspicious focal abnormality within the liver parenchyma. There is no evidence for gallstones, gallbladder wall thickening, or pericholecystic fluid. No intrahepatic or extrahepatic biliary dilation. Pancreas: No focal mass lesion. No dilatation of the main duct. No intraparenchymal cyst. No peripancreatic edema. Spleen: No splenomegaly. No focal mass lesion. Adrenals/Urinary Tract: No adrenal nodule or mass. Right kidney unremarkable. Central sinus cysts noted left kidney. No evidence for hydroureter. The urinary bladder appears normal for the degree of distention. Stomach/Bowel: Stomach is unremarkable. No gastric wall thickening. No evidence of outlet obstruction. Duodenum is normally positioned as is the ligament of Treitz. Small bowel in the pelvis is fluid-filled and distended up to 2.7 cm. There is a segment in the anterior pelvis demonstrating fecalization of enteric contents and subtle adjacent edema in the subtending mesentery. No discrete transition zone evident. Distal ileum is decompressed. Terminal ileum unremarkable. The appendix is not well visualized, but there is no edema or inflammation in the  region of the cecum. No gross colonic mass. No colonic wall thickening. Vascular/Lymphatic: There is moderate atherosclerotic calcification of the abdominal aorta without aneurysm. There is no gastrohepatic or hepatoduodenal ligament lymphadenopathy. No retroperitoneal or mesenteric lymphadenopathy. No pelvic sidewall lymphadenopathy. 15 mm short axis lymph node identified in the right groin is mildly enlarged, similar to prior. Reproductive: Uterus surgically absent. Pessary identified in the vagina. Other: Trace free fluid is seen in the pelvis. 4.7 x 1.4 cm right omental soft tissue identified previously is stable on 46/3 today. There is peritoneal thickening along the left paracolic gutter with additional scattered peritoneal and mesenteric nodules (left omentum 38/3, left para colonic on 48/3, and central mesentery on 54/3). Musculoskeletal: No worrisome lytic or sclerotic osseous abnormality. Superior endplate compression deformity noted at L4 and L5. IMPRESSION: 1. Mildly distended fluid-filled small bowel in the pelvis with fecalization of enteric contents and subtle adjacent edema in the subtending mesentery. No discrete transition zone evident. Imaging features are compatible with small bowel stricture or evolving in this patient with a reported history small bowel obstruction. 2. Stable appearance of the right omental soft tissue lesion with scattered peritoneal and mesenteric nodules of ovarian cancer. 3. Small right pleural effusion with right lower lobe atelectasis. 4. Aortic Atherosclerosis (ICD10-I70.0). Electronically Signed   By: EMisty StanleyM.D.   On: 11/06/2021 09:43  ? ?DG Abd Portable 1V ? ?Result Date: 11/07/2021 ?CLINICAL DATA:  Small bowel obstruction. EXAM: PORTABLE ABDOMEN - 1  VIEW COMPARISON:  06/28/2021 FINDINGS: Bowel gas pattern is nonobstructive. There is no free peritoneal air. Single surgical clip over the mid abdomen. Degenerative changes of the spine and hips. IMPRESSION:  Nonobstructive bowel gas pattern. Electronically Signed   By: Marin Olp M.D.   On: 11/07/2021 07:59  ? ?ECHOCARDIOGRAM COMPLETE ? ?Result Date: 10/26/2021 ?   ECHOCARDIOGRAM REPORT   Patient Name:   TANIYAH BALLOW Romberg

## 2021-11-10 NOTE — Discharge Summary (Signed)
Physician Discharge Summary  ?Paula Peterson QIO:[id number] DOB: February 07, 1941 DOA: 11/06/2021 ? ?PCP: Caryl Bis, MD ? ?Admit date: 11/06/2021 ?Discharge date: 11/10/2021 ? ?Time spent: 35 minutes ? ?Recommendations for Outpatient Follow-up:  ? ?SBO ?-N.p.o. ?- Antiemetics PRN ?-5/12 D5-0.9% saline 52m/hr ?-5/12 start clear liquid diet ?-5/13 advance diet to full liquid ?-5/14 advance diet to bland. ?-5/15 resolved ?  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 RDenman George?- currently undergoing chemotherapy carboplatin + Doxil (last dose 10/28/21) with good response on CT scan from 08/2021), followed by Dr. GAlvy Bimler?-Her recent CT imaging in March showed positive response to treatment ?This is at the expense of severe pancytopenia with history of infection ?-Per EMR Dr. GAlvy Bimleroncology discussed the risk and benefits of continuing treatment with combination of carboplatin and Doxil, continuing treatment with Doxil only I will take a break from treatment ?After much discussion, she is in agreement to proceed with combination of carboplatin and Doxil ?-Per EMR Dr. GAlvy Bimleroncology made patient aware about risk of cardiomyopathy with Doxil and risk of worsening pancytopenia and allergic reaction with carboplatin ?-Echocardiogram report was reviewed, without signs of cardiomyopathy ?-She received her last chemo treatment on 10/28/2021 ?-CT abdomen/pelvis performed this admission shows stable disease ?-Plan to continue outpatient chemo ?-Seen by Dr. NHeath Larkoncology: On board seeing patient. ?-Outpatient chemotherapy per Dr. NHeath Larkoncology: ?  ?Macrocytic anemia ?-5/12 anemia panel consistent with macrocytic anemia. ? ?Thrombocytopenia ? ? Latest Reference Range & Units 11/06/21 06:12 11/07/21 05:02 11/08/21 04:55 11/09/21 03:06 11/10/21 03:09  ?Platelets 150 - 400 K/uL 124 (L) 119 (L) 114 (L) 103 (L) 92 (L)  ?(L): Data is abnormally  low ?-Most likely reactive secondary to her chemotherapy on 5/2 as well as resolving SBO ?-Negative sign of overt bleeding, follow-up with Dr. NHeath Larkoncology ? ?HTN ?-5/12 Atenolol 25 mg daily ?  ?Hx PE ?- Restart home Eliquis dose 5 mg BID ?  ?Hypothyroidism ?-Synthroid 75 mcg daily ?  ?Obesity (BMI 34.11 kg/m?). ?-Address with PCP.  Patient with cancer  ?  ?Hypomagnesmia ?- Magnesium goal> 2 ?- 5/13 Magnesium IV 3 g ? ?Pubic cellulitis ?- Doxycycline 100 mg BID x7 days ? ? ?Discharge Diagnoses:  ?Principal Problem: ?  S[place] ([place] obstruction) [place] ?Active Problems: ?  Acquired hypothyroidism ?  PE ([place] throm[place]embolism) [place] ?  Essential hypertension ?  CKD (chronic kidney disease), stage III ([facility] ?  Obesity (BMI 30-39.9) ?  Macrocytic anemia ?  Cellulitis ? ? ?Discharge Condition: Stable ? ?Diet recommendation: Regular ? ?Filed Weights  ? 11/06/21 1500 11/08/21 0550  ?Weight: 98.8 kg 96.7 kg  ? ? ?History of present illness:  ?81y.o. WF PMHx DVT/PE on Eliquis, ovarian cancer, HTN, hypothyroidism.  ?  ?Presenting with abdominal pain. Symptoms started yesterday. She had sharp, epigastric pain. It came in episodes lasting 5 - 10 minutes. She tried her regular pain meds, but they didn't help. She then started having episodes of vomiting. She tried her some anti-emetics. They helped a little, but not much. She didn't have any fever or diarrhea. When her symptoms continued this morning, she became concerned and came to the ED for help. She denies any other aggravating or alleviating factors. ? ?Hospital Course:  ?See above ? ? ?Consultations: ?Dr. GAlvy Bimleroncology ? ? ?Antibiotics ? ? ? ?Discharge Exam: ?Vitals:  ? 11/09/21 0533 11/09/21 1347 11/09/21 2108 11/10/21 0510  ?BP: (!) 147/66 (Marland Kitchen  110/51 (!) 144/61 (!) 109/50  ?Pulse: 74 69 73 69  ?Resp: '16 14 18 18  '$ ?Temp: 98.1 ?F (36.7 ?C) 97.8 ?F (36.6 ?C) 97.8 ?F (36.6 ?C) 97.7 ?F (36.5 ?C)  ?TempSrc: Oral Oral Oral Oral  ?SpO2: 98% 98% 100% 97%   ?Weight:      ?Height:      ? ? ?General: A/O x4, No acute respiratory distress.  Sitting in chair ?Eyes: negative scleral hemorrhage, negative anisocoria, negative icterus ?ENT: Negative Runny nose, negative gingival bleeding, ?Neck:  Negative scars, masses, torticollis, lymphadenopathy, JVD ?Lungs: Clear to auscultation bilaterally without wheezes or crackles ?Cardiovascular: Regular rate and rhythm without murmur gallop or rub normal S1 and S2 ?Abdomen: negative abdominal pain, nondistended, positive soft, bowel sounds, no rebound, no ascites, no appreciable mass ? ?Discharge Instructions ? ? ?Allergies as of 11/10/2021   ?No Known Allergies ?  ? ?  ?Medication List  ?  ? ?STOP taking these medications   ? ?nystatin 100000 UNIT/ML suspension ?Commonly known as: MYCOSTATIN ?  ? ?  ? ?TAKE these medications   ? ?acetaminophen 500 MG tablet ?Commonly known as: TYLENOL ?Take 1,000 mg by mouth every 6 (six) hours as needed for mild pain or fever. ?  ?anti-nausea solution ?Take 10 mLs by mouth every 15 (fifteen) minutes as needed for nausea or vomiting. ?  ?apixaban 5 MG Tabs tablet ?Commonly known as: Eliquis ?Take 1 tablet (5 mg total) by mouth 2 (two) times daily. ?  ?atenolol 25 MG tablet ?Commonly known as: TENORMIN ?TAKE ONE (1) TABLET BY MOUTH EVERY DAY ?What changed: how much to take ?  ?doxycycline 100 MG tablet ?Commonly known as: VIBRA-TABS ?Take 1 tablet (100 mg total) by mouth every 12 (twelve) hours. ?  ?DULCOLAX SOFT CHEWS PO ?Take 2 each by mouth daily as needed (constipation). ?  ?DULoxetine 20 MG capsule ?Commonly known as: CYMBALTA ?Take 20 mg by mouth daily. ?  ?levothyroxine 75 MCG tablet ?Commonly known as: SYNTHROID ?Take 1 tablet (75 mcg total) by mouth daily before breakfast. ?  ?lidocaine-prilocaine cream ?Commonly known as: EMLA ?Apply 1 application topically daily as needed (port access). ?  ?mometasone 50 MCG/ACT nasal spray ?Commonly known as: NASONEX ?Place 2 sprays into the nose daily as  needed (allergies). ?  ?ondansetron 8 MG tablet ?Commonly known as: Zofran ?Take 1 tablet (8 mg total) by mouth 2 (two) times daily as needed (Nausea or vomiting). ?  ?Voltaren 1 % Gel ?Generic drug: diclofenac Sodium ?Apply 1 application. topically daily as needed (arthritis pain). ?  ? ?  ? ?No Known Allergies ? ? ? ?The results of significant diagnostics from this hospitalization (including imaging, microbiology, ancillary and laboratory) are listed below for reference.   ? ?Significant Diagnostic Studies: ?CT Abdomen Pelvis W Contrast ? ?Result Date: 11/06/2021 ?CLINICAL DATA:  Abdominal pain and vomiting EXAM: CT ABDOMEN AND PELVIS WITH CONTRAST TECHNIQUE: Multidetector CT imaging of the abdomen and pelvis was performed using the standard protocol following bolus administration of intravenous contrast. RADIATION DOSE REDUCTION: This exam was performed according to the departmental dose-optimization program which includes automated exposure control, adjustment of the mA and/or kV according to patient size and/or use of iterative reconstruction technique. CONTRAST:  197m OMNIPAQUE IOHEXOL 300 MG/ML  SOLN COMPARISON:  09/23/2021 FINDINGS: Study limited by technical issues. The scanner stopped twice during the study resulting in a fragmented exam into 3 parts. Lower chest: Small right pleural effusion with right lower lobe atelectasis. Hepatobiliary: No suspicious focal  abnormality within the liver parenchyma. There is no evidence for gallstones, gallbladder wall thickening, or pericholecystic fluid. No intrahepatic or extrahepatic biliary dilation. Pancreas: No focal mass lesion. No dilatation of the main duct. No intraparenchymal cyst. No peripancreatic edema. Spleen: No splenomegaly. No focal mass lesion. Adrenals/Urinary Tract: No adrenal nodule or mass. Right kidney unremarkable. Central sinus cysts noted left kidney. No evidence for hydroureter. The urinary bladder appears normal for the degree of distention.  Stomach/Bowel: Stomach is unremarkable. No gastric wall thickening. No evidence of outlet obstruction. Duodenum is normally positioned as is the ligament of Treitz. Small bowel in the pelvis is fluid-fille

## 2021-11-18 ENCOUNTER — Other Ambulatory Visit: Payer: Self-pay | Admitting: Hematology and Oncology

## 2021-11-18 DIAGNOSIS — C561 Malignant neoplasm of right ovary: Secondary | ICD-10-CM

## 2021-11-18 DIAGNOSIS — Z7189 Other specified counseling: Secondary | ICD-10-CM

## 2021-11-20 ENCOUNTER — Other Ambulatory Visit (HOSPITAL_COMMUNITY): Payer: Medicare Other

## 2021-11-21 ENCOUNTER — Other Ambulatory Visit: Payer: Self-pay | Admitting: Hematology and Oncology

## 2021-11-21 ENCOUNTER — Telehealth: Payer: Self-pay

## 2021-11-21 NOTE — Telephone Encounter (Signed)
-----   Message from [name], MD sent at 11/21/2021 10:09 AM EDT ----- I received request from pharmacy to refill tramadol That was DC a few months ago Can you call her and ask if she needs tramadol?

## 2021-11-21 NOTE — Telephone Encounter (Signed)
Verified with patient that she had requested refill of her tramadol. Patient notes that she requested the medication to help alleviate ongoing back pain that has become more severe. Patient had recently followed-up with separate provider to address the pain and was hoping the tramadol would help in conjunction with other recommended treatments.  Dr. Alvy Bimler made aware of the situation.

## 2021-12-01 ENCOUNTER — Telehealth: Payer: Self-pay

## 2021-12-01 NOTE — Telephone Encounter (Signed)
Returned her call. She is having a lot of back issues. PCP gave her Prednisone and it did not help. Dr. Nelva Bush did a injection and it did not help. The earliest appt she could get with Dr. Nelva Bush is August.  She is concerned that the cancer has spread to her back. She is having some abdominal swelling and tightness. At times she has a hard time urinating. Her vaginal spotting is happening more often, the spotting happens every other day.  She asking if you could order a MRI.

## 2021-12-01 NOTE — Telephone Encounter (Signed)
Her recent CT in hospital showed "Superior endplate compression deformity noted at L4 and L5." That would cause pain Likely osteoporosis I can show it to her next week MRI is helpful ONLY if she wants surgery and due to recurrent PE, she is high risk for surgery Does she have pain medicine to take?

## 2021-12-01 NOTE — Telephone Encounter (Signed)
Called her back and given below message. She verbalized understanding and appreciated the call. She has pain medication and will call the office back if needed.

## 2021-12-03 ENCOUNTER — Other Ambulatory Visit (HOSPITAL_COMMUNITY): Payer: Self-pay

## 2021-12-05 MED FILL — Fosaprepitant Dimeglumine For IV Infusion 150 MG (Base Eq): INTRAVENOUS | Qty: 5 | Status: AC

## 2021-12-05 MED FILL — Dexamethasone Sodium Phosphate Inj 100 MG/10ML: INTRAMUSCULAR | Qty: 1 | Status: AC

## 2021-12-08 ENCOUNTER — Inpatient Hospital Stay: Payer: Medicare Other | Attending: Gynecologic Oncology

## 2021-12-08 ENCOUNTER — Inpatient Hospital Stay: Payer: Medicare Other | Admitting: Hematology and Oncology

## 2021-12-08 ENCOUNTER — Other Ambulatory Visit: Payer: Self-pay | Admitting: Hematology and Oncology

## 2021-12-08 ENCOUNTER — Encounter: Payer: Self-pay | Admitting: Hematology and Oncology

## 2021-12-08 ENCOUNTER — Inpatient Hospital Stay: Payer: Medicare Other

## 2021-12-08 ENCOUNTER — Other Ambulatory Visit: Payer: Self-pay

## 2021-12-08 DIAGNOSIS — Z7901 Long term (current) use of anticoagulants: Secondary | ICD-10-CM | POA: Insufficient documentation

## 2021-12-08 DIAGNOSIS — Z79899 Other long term (current) drug therapy: Secondary | ICD-10-CM | POA: Diagnosis not present

## 2021-12-08 DIAGNOSIS — I2699 Other pulmonary embolism without acute cor pulmonale: Secondary | ICD-10-CM | POA: Diagnosis not present

## 2021-12-08 DIAGNOSIS — D61818 Other pancytopenia: Secondary | ICD-10-CM | POA: Insufficient documentation

## 2021-12-08 DIAGNOSIS — N1832 Chronic kidney disease, stage 3b: Secondary | ICD-10-CM | POA: Diagnosis not present

## 2021-12-08 DIAGNOSIS — Z5111 Encounter for antineoplastic chemotherapy: Secondary | ICD-10-CM | POA: Diagnosis not present

## 2021-12-08 DIAGNOSIS — Z90721 Acquired absence of ovaries, unilateral: Secondary | ICD-10-CM | POA: Diagnosis not present

## 2021-12-08 DIAGNOSIS — C561 Malignant neoplasm of right ovary: Secondary | ICD-10-CM

## 2021-12-08 DIAGNOSIS — Z7189 Other specified counseling: Secondary | ICD-10-CM

## 2021-12-08 DIAGNOSIS — N390 Urinary tract infection, site not specified: Secondary | ICD-10-CM | POA: Diagnosis not present

## 2021-12-08 DIAGNOSIS — C786 Secondary malignant neoplasm of retroperitoneum and peritoneum: Secondary | ICD-10-CM | POA: Diagnosis not present

## 2021-12-08 DIAGNOSIS — I1310 Hypertensive heart and chronic kidney disease without heart failure, with stage 1 through stage 4 chronic kidney disease, or unspecified chronic kidney disease: Secondary | ICD-10-CM | POA: Insufficient documentation

## 2021-12-08 DIAGNOSIS — Z9221 Personal history of antineoplastic chemotherapy: Secondary | ICD-10-CM | POA: Insufficient documentation

## 2021-12-08 DIAGNOSIS — N183 Chronic kidney disease, stage 3 unspecified: Secondary | ICD-10-CM | POA: Diagnosis not present

## 2021-12-08 LAB — CBC WITH DIFFERENTIAL (CANCER CENTER ONLY)
Abs Immature Granulocytes: 0.01 10*3/uL (ref 0.00–0.07)
Basophils Absolute: 0 10*3/uL (ref 0.0–0.1)
Basophils Relative: 0 %
Eosinophils Absolute: 0 10*3/uL (ref 0.0–0.5)
Eosinophils Relative: 1 %
HCT: 26.8 % — ABNORMAL LOW (ref 36.0–46.0)
Hemoglobin: 9 g/dL — ABNORMAL LOW (ref 12.0–15.0)
Immature Granulocytes: 0 %
Lymphocytes Relative: 35 %
Lymphs Abs: 1.1 10*3/uL (ref 0.7–4.0)
MCH: 37.5 pg — ABNORMAL HIGH (ref 26.0–34.0)
MCHC: 33.6 g/dL (ref 30.0–36.0)
MCV: 111.7 fL — ABNORMAL HIGH (ref 80.0–100.0)
Monocytes Absolute: 0.4 10*3/uL (ref 0.1–1.0)
Monocytes Relative: 12 %
Neutro Abs: 1.7 10*3/uL (ref 1.7–7.7)
Neutrophils Relative %: 52 %
Platelet Count: 156 10*3/uL (ref 150–400)
RBC: 2.4 MIL/uL — ABNORMAL LOW (ref 3.87–5.11)
RDW: 14.9 % (ref 11.5–15.5)
WBC Count: 3.2 10*3/uL — ABNORMAL LOW (ref 4.0–10.5)
nRBC: 0 % (ref 0.0–0.2)

## 2021-12-08 LAB — CMP (CANCER CENTER ONLY)
ALT: 23 U/L (ref 0–44)
AST: 18 U/L (ref 15–41)
Albumin: 3.6 g/dL (ref 3.5–5.0)
Alkaline Phosphatase: 119 U/L (ref 38–126)
Anion gap: 6 (ref 5–15)
BUN: 14 mg/dL (ref 8–23)
CO2: 30 mmol/L (ref 22–32)
Calcium: 10 mg/dL (ref 8.9–10.3)
Chloride: 100 mmol/L (ref 98–111)
Creatinine: 1.14 mg/dL — ABNORMAL HIGH (ref 0.44–1.00)
GFR, Estimated: 49 mL/min — ABNORMAL LOW (ref >60)
Glucose, Bld: 137 mg/dL — ABNORMAL HIGH (ref 70–99)
Potassium: 3.9 mmol/L (ref 3.5–5.1)
Sodium: 136 mmol/L (ref 135–145)
Total Bilirubin: 0.3 mg/dL (ref 0.3–1.2)
Total Protein: 6.6 g/dL (ref 6.5–8.1)

## 2021-12-08 LAB — URINALYSIS, COMPLETE (UACMP) WITH MICROSCOPIC
Bacteria, UA: NONE SEEN
Bilirubin Urine: NEGATIVE
Glucose, UA: NEGATIVE mg/dL
Ketones, ur: NEGATIVE mg/dL
Nitrite: NEGATIVE
Protein, ur: NEGATIVE mg/dL
Specific Gravity, Urine: 1.01 (ref 1.005–1.030)
pH: 6 (ref 5.0–8.0)

## 2021-12-08 MED ORDER — TRAMADOL HCL 50 MG PO TABS: 50.0000 mg | ORAL_TABLET | Freq: Four times a day (QID) | ORAL | 0 refills | Status: DC | PRN

## 2021-12-08 MED ORDER — DEXTROSE 5 % IV SOLN: Freq: Once | INTRAVENOUS | Status: AC

## 2021-12-08 MED ORDER — SODIUM CHLORIDE 0.9 % IV SOLN
264.6000 mg | Freq: Once | INTRAVENOUS | Status: AC
  Administered 2021-12-08: 260 mg via INTRAVENOUS
  Filled 2021-12-08: qty 26

## 2021-12-08 MED ORDER — SODIUM CHLORIDE 0.9 % IV SOLN
150.0000 mg | Freq: Once | INTRAVENOUS | Status: AC
  Administered 2021-12-08: 150 mg via INTRAVENOUS
  Filled 2021-12-08: qty 150

## 2021-12-08 MED ORDER — FAMOTIDINE IN NACL 20-0.9 MG/50ML-% IV SOLN
20.0000 mg | Freq: Once | INTRAVENOUS | Status: AC
  Administered 2021-12-08: 20 mg via INTRAVENOUS
  Filled 2021-12-08: qty 50

## 2021-12-08 MED ORDER — PALONOSETRON HCL INJECTION 0.25 MG/5ML
0.2500 mg | Freq: Once | INTRAVENOUS | Status: AC
  Administered 2021-12-08: 0.25 mg via INTRAVENOUS
  Filled 2021-12-08: qty 5

## 2021-12-08 MED ORDER — DIPHENHYDRAMINE HCL 50 MG/ML IJ SOLN
25.0000 mg | Freq: Once | INTRAMUSCULAR | Status: AC
  Administered 2021-12-08: 25 mg via INTRAVENOUS
  Filled 2021-12-08: qty 1

## 2021-12-08 MED ORDER — HEPARIN SOD (PORK) LOCK FLUSH 100 UNIT/ML IV SOLN
500.0000 [IU] | Freq: Once | INTRAVENOUS | Status: AC | PRN
  Administered 2021-12-08: 500 [IU]

## 2021-12-08 MED ORDER — SODIUM CHLORIDE 0.9% FLUSH
10.0000 mL | Freq: Once | INTRAVENOUS | Status: AC
  Administered 2021-12-08: 10 mL

## 2021-12-08 MED ORDER — SODIUM CHLORIDE 0.9 % IV SOLN
10.0000 mg | Freq: Once | INTRAVENOUS | Status: AC
  Administered 2021-12-08: 10 mg via INTRAVENOUS
  Filled 2021-12-08: qty 10

## 2021-12-08 MED ORDER — DOXORUBICIN HCL LIPOSOMAL CHEMO INJECTION 2 MG/ML
23.5000 mg/m2 | Freq: Once | INTRAVENOUS | Status: AC
  Administered 2021-12-08: 50 mg via INTRAVENOUS
  Filled 2021-12-08: qty 25

## 2021-12-08 MED ORDER — SODIUM CHLORIDE 0.9% FLUSH
10.0000 mL | INTRAVENOUS | Status: DC | PRN
  Administered 2021-12-08: 10 mL

## 2021-12-08 NOTE — Patient Instructions (Signed)
[facility]   Discharge Instructions: Thank you for choosing [name] to provide your oncology and hematology care.   If you have a lab appointment with the [facility], please go directly to the [facility] and check in at the registration area.   Wear comfortable clothing and clothing appropriate for easy access to any Portacath or PICC line.   We strive to give you quality time with your provider. You may need to reschedule your appointment if you arrive late (15 or more minutes).  Arriving late affects you and other patients whose appointments are after yours.  Also, if you miss three or more appointments without notifying the office, you may be dismissed from the clinic at the provider's discretion.      For prescription refill requests, have your pharmacy contact our office and allow 72 hours for refills to be completed.    Today you received the following chemotherapy and/or immunotherapy agents: doxorubicin liposomal and carboplatin.      To help prevent nausea and vomiting after your treatment, we encourage you to take your nausea medication as directed.  BELOW ARE SYMPTOMS THAT SHOULD BE REPORTED IMMEDIATELY: *FEVER GREATER THAN 100.4 F (38 C) OR HIGHER *CHILLS OR SWEATING *NAUSEA AND VOMITING THAT IS NOT CONTROLLED WITH YOUR NAUSEA MEDICATION *UNUSUAL SHORTNESS OF BREATH *UNUSUAL BRUISING OR BLEEDING *URINARY PROBLEMS (pain or burning when urinating, or frequent urination) *BOWEL PROBLEMS (unusual diarrhea, constipation, pain near the anus) TENDERNESS IN MOUTH AND THROAT WITH OR WITHOUT PRESENCE OF ULCERS (sore throat, sores in mouth, or a toothache) UNUSUAL RASH, SWELLING OR PAIN  UNUSUAL VAGINAL DISCHARGE OR ITCHING   Items with * indicate a potential emergency and should be followed up as soon as possible or go to the Emergency Department if any problems should occur.  Please show the CHEMOTHERAPY ALERT CARD or  IMMUNOTHERAPY ALERT CARD at check-in to the Emergency Department and triage nurse.  Should you have questions after your visit or need to cancel or reschedule your appointment, please contact [facility]  Dept: [phone]  and follow the prompts.  Office hours are 8:00 a.m. to 4:30 p.m. Monday - Friday. Please note that voicemails left after 4:00 p.m. may not be returned until the following business day.  We are closed weekends and major holidays. You have access to a nurse at all times for urgent questions. Please call the main number to the clinic Dept: [phone] and follow the prompts.   For any non-urgent questions, you may also contact your provider using MyChart. We now offer e-Visits for anyone 63 and older to request care online for non-urgent symptoms. For details visit [email or web address].   Also download the MyChart app! Go to the app store, search "MyChart", open the app, select [facility], and log in with your MyChart username and password.  Due to Covid, a mask is required upon entering the hospital/clinic. If you do not have a mask, one will be given to you upon arrival. For doctor visits, patients may have 1 support person aged 64 or older with them. For treatment visits, patients cannot have anyone with them due to current Covid guidelines and our immunocompromised population.   Doxorubicin Liposomal injection What is this medication? LIPOSOMAL DOXORUBICIN (LIP oh som al  dox oh ROO bi sin) is a chemotherapy drug. This medicine is used to treat many kinds of cancer like Kaposi's sarcoma, multiple myeloma, and ovarian cancer. This medicine may be  used for other purposes; ask your health care provider or pharmacist if you have questions. COMMON BRAND NAME(S): Doxil, Lipodox What should I tell my care team before I take this medication? They need to know if you have any of these conditions: blood disorders heart disease infection (especially a  virus infection such as chickenpox, cold sores, or herpes) liver disease recent or ongoing radiation therapy an unusual or allergic reaction to doxorubicin, other chemotherapy agents, soybeans, other medicines, foods, dyes, or preservatives pregnant or trying to get pregnant breast-feeding How should I use this medication? This drug is given as an infusion into a vein. It is administered in a hospital or clinic by a specially trained health care professional. If you have pain, swelling, burning or any unusual feeling around the site of your injection, tell your health care professional right away. Talk to your pediatrician regarding the use of this medicine in children. Special care may be needed. Overdosage: If you think you have taken too much of this medicine contact a poison control center or emergency room at once. NOTE: This medicine is only for you. Do not share this medicine with others. What if I miss a dose? It is important not to miss your dose. Call your doctor or health care professional if you are unable to keep an appointment. What may interact with this medication? Do not take this medicine with any of the following medications: zidovudine This medicine may also interact with the following medications: medicines to increase blood counts like filgrastim, pegfilgrastim, sargramostim vaccines Talk to your doctor or health care professional before taking any of these medicines: acetaminophen aspirin ibuprofen ketoprofen naproxen This list may not describe all possible interactions. Give your health care provider a list of all the medicines, herbs, non-prescription drugs, or dietary supplements you use. Also tell them if you smoke, drink alcohol, or use illegal drugs. Some items may interact with your medicine. What should I watch for while using this medication? Your condition will be monitored carefully while you are receiving this medicine. You may need blood work done while  you are taking this medicine. This drug may make you feel generally unwell. This is not uncommon, as chemotherapy can affect healthy cells as well as cancer cells. Report any side effects. Continue your course of treatment even though you feel ill unless your doctor tells you to stop. Your urine may turn orange-red for a few days after your dose. This is not blood. If your urine is dark or brown, call your doctor. In some cases, you may be given additional medicines to help with side effects. Follow all directions for their use. Talk to your doctor about your risk of cancer. You may be more at risk for certain types of cancers if you take this medicine. Do not become pregnant while taking this medicine or for 6 months after stopping it. Women should inform their healthcare professional if they wish to become pregnant or think they may be pregnant. Men should not father a child while taking this medicine and for 6 months after stopping it. There is a potential for serious side effects to an unborn child. Talk to your health care professional or pharmacist for more information. Do not breast-feed an infant while taking this medicine. This medicine has caused ovarian failure in some women. This medicine may make it more difficult to get pregnant. Talk to your healthcare professional if you are concerned about your fertility. This medicine has caused decreased sperm counts in some  men. This may make it more difficult to father a child. Talk to your healthcare professional if you are concerned about your fertility. This medicine may cause a decrease in Co-Enzyme Q-10. You should make sure that you get enough Co-Enzyme Q-10 while you are taking this medicine. Discuss the foods you eat and the vitamins you take with your health care professional. What side effects may I notice from receiving this medication? Side effects that you should report to your doctor or health care professional as soon as  possible: allergic reactions like skin rash, itching or hives, swelling of the face, lips, or tongue low blood counts - this medicine may decrease the number of white blood cells, red blood cells and platelets. You may be at increased risk for infections and bleeding. signs of hand-foot syndrome - tingling or burning, redness, flaking, swelling, small blisters, or small sores on the palms of your hands or the soles of your feet signs of infection - fever or chills, cough, sore throat, pain or difficulty passing urine signs of decreased platelets or bleeding - bruising, pinpoint red spots on the skin, black, tarry stools, blood in the urine signs of decreased red blood cells - unusually weak or tired, fainting spells, lightheadedness back pain, chills, facial flushing, fever, headache, tightness in the chest or throat during the infusion breathing problems chest pain fast, irregular heartbeat mouth pain, redness, sores pain, swelling, redness at site where injected pain, tingling, numbness in the hands or feet swelling of ankles, feet, or hands vomiting Side effects that usually do not require medical attention (report to your doctor or health care professional if they continue or are bothersome): diarrhea hair loss loss of appetite nail discoloration or damage nausea red or watery eyes red colored urine stomach upset This list may not describe all possible side effects. Call your doctor for medical advice about side effects. You may report side effects to FDA at 1-800-FDA-1088. Where should I keep my medication? This drug is given in a hospital or clinic and will not be stored at home. NOTE: This sheet is a summary. It may not cover all possible information. If you have questions about this medicine, talk to your doctor, pharmacist, or health care provider.  2023 Elsevier/Gold Standard (2018-02-23 00:00:00)  Carboplatin injection What is this medication? CARBOPLATIN (KAR boe pla  tin) is a chemotherapy drug. It targets fast dividing cells, like cancer cells, and causes these cells to die. This medicine is used to treat ovarian cancer and many other cancers. This medicine may be used for other purposes; ask your health care provider or pharmacist if you have questions. COMMON BRAND NAME(S): Paraplatin What should I tell my care team before I take this medication? They need to know if you have any of these conditions: blood disorders hearing problems kidney disease recent or ongoing radiation therapy an unusual or allergic reaction to carboplatin, cisplatin, other chemotherapy, other medicines, foods, dyes, or preservatives pregnant or trying to get pregnant breast-feeding How should I use this medication? This drug is usually given as an infusion into a vein. It is administered in a hospital or clinic by a specially trained health care professional. Talk to your pediatrician regarding the use of this medicine in children. Special care may be needed. Overdosage: If you think you have taken too much of this medicine contact a poison control center or emergency room at once. NOTE: This medicine is only for you. Do not share this medicine with others. What if I  miss a dose? It is important not to miss a dose. Call your doctor or health care professional if you are unable to keep an appointment. What may interact with this medication? medicines for seizures medicines to increase blood counts like filgrastim, pegfilgrastim, sargramostim some antibiotics like amikacin, gentamicin, neomycin, streptomycin, tobramycin vaccines Talk to your doctor or health care professional before taking any of these medicines: acetaminophen aspirin ibuprofen ketoprofen naproxen This list may not describe all possible interactions. Give your health care provider a list of all the medicines, herbs, non-prescription drugs, or dietary supplements you use. Also tell them if you smoke, drink  alcohol, or use illegal drugs. Some items may interact with your medicine. What should I watch for while using this medication? Your condition will be monitored carefully while you are receiving this medicine. You will need important blood work done while you are taking this medicine. This drug may make you feel generally unwell. This is not uncommon, as chemotherapy can affect healthy cells as well as cancer cells. Report any side effects. Continue your course of treatment even though you feel ill unless your doctor tells you to stop. In some cases, you may be given additional medicines to help with side effects. Follow all directions for their use. Call your doctor or health care professional for advice if you get a fever, chills or sore throat, or other symptoms of a cold or flu. Do not treat yourself. This drug decreases your body's ability to fight infections. Try to avoid being around people who are sick. This medicine may increase your risk to bruise or bleed. Call your doctor or health care professional if you notice any unusual bleeding. Be careful brushing and flossing your teeth or using a toothpick because you may get an infection or bleed more easily. If you have any dental work done, tell your dentist you are receiving this medicine. Avoid taking products that contain aspirin, acetaminophen, ibuprofen, naproxen, or ketoprofen unless instructed by your doctor. These medicines may hide a fever. Do not become pregnant while taking this medicine. Women should inform their doctor if they wish to become pregnant or think they might be pregnant. There is a potential for serious side effects to an unborn child. Talk to your health care professional or pharmacist for more information. Do not breast-feed an infant while taking this medicine. What side effects may I notice from receiving this medication? Side effects that you should report to your doctor or health care professional as soon as  possible: allergic reactions like skin rash, itching or hives, swelling of the face, lips, or tongue signs of infection - fever or chills, cough, sore throat, pain or difficulty passing urine signs of decreased platelets or bleeding - bruising, pinpoint red spots on the skin, black, tarry stools, nosebleeds signs of decreased red blood cells - unusually weak or tired, fainting spells, lightheadedness breathing problems changes in hearing changes in vision chest pain high blood pressure low blood counts - This drug may decrease the number of white blood cells, red blood cells and platelets. You may be at increased risk for infections and bleeding. nausea and vomiting pain, swelling, redness or irritation at the injection site pain, tingling, numbness in the hands or feet problems with balance, talking, walking trouble passing urine or change in the amount of urine Side effects that usually do not require medical attention (report to your doctor or health care professional if they continue or are bothersome): hair loss loss of appetite metallic taste  in the mouth or changes in taste This list may not describe all possible side effects. Call your doctor for medical advice about side effects. You may report side effects to FDA at 1-800-FDA-1088. Where should I keep my medication? This drug is given in a hospital or clinic and will not be stored at home. NOTE: This sheet is a summary. It may not cover all possible information. If you have questions about this medicine, talk to your doctor, pharmacist, or health care provider.  2023 Elsevier/Gold Standard (2007-11-23 00:00:00)

## 2021-12-08 NOTE — Progress Notes (Signed)
[facility] OFFICE PROGRESS NOTE  Patient Care Team: Caryl Bis, MD as PCP - General (Family Medicine)  ASSESSMENT & PLAN:  Right ovarian epithelial cancer [employer]) She is recovering well from recent hospitalization She will continue treatment as scheduled She does not need repeat imaging study until August  Pancytopenia, acquired [facility]) This is due to recent treatment She is not symptomatic We will proceed with treatment without delay  PE (pulmonary thromboembolism) ([place]) She is on chronic anticoagulation therapy She has occasional vaginal spotting Observe closely  Recurrent UTI She had recent sensation of dysuria I will order urinalysis and urine culture It could be related to vaginal dryness related to estrogen deficiency  CKD (chronic kidney disease), stage III ([place]) She has mild intermittent elevated serum creatinine We will observe closely and adjust the dose of carboplatin accordingly  No orders of the defined types were placed in this encounter.   All questions were answered. The patient knows to call the clinic with any problems, questions or concerns. The total time spent in the appointment was 30 minutes encounter with patients including review of chart and various tests results, discussions about plan of care and coordination of care plan   [name], MD 12/08/2021 12:01 PM  INTERVAL HISTORY: Please see below for problem oriented charting. she returns for treatment follow-up She was recently hospitalized for recurrent bowel obstruction She had rare occasional nausea She has intermittent vaginal spotting but not significant She have sensation of dysuria when she urinates Her appetite is poor and she have lost some weight since discharge from the hospital Denies recent constipation  REVIEW OF SYSTEMS:   Constitutional: Denies fevers, chills  Eyes: Denies blurriness of vision Ears, nose, mouth, throat, and face: Denies mucositis or sore  throat Respiratory: Denies cough, dyspnea or wheezes Cardiovascular: Denies palpitation, chest discomfort or lower extremity swelling Skin: Denies abnormal skin rashes Lymphatics: Denies new lymphadenopathy or easy bruising Neurological:Denies numbness, tingling or new weaknesses Behavioral/Psych: Mood is stable, no new changes  All other systems were reviewed with the patient and are negative.  I have reviewed the past medical history, past surgical history, social history and family history with the patient and they are unchanged from previous note.  ALLERGIES:  has No Known Allergies.  MEDICATIONS:  Current Outpatient Medications  Medication Sig Dispense Refill   acetaminophen (TYLENOL) 500 MG tablet Take 1,000 mg by mouth every 6 (six) hours as needed for mild pain or fever.     anti-nausea (EMETROL) solution Take 10 mLs by mouth every 15 (fifteen) minutes as needed for nausea or vomiting.     apixaban (ELIQUIS) 5 MG TABS tablet Take 1 tablet (5 mg total) by mouth 2 (two) times daily. 60 tablet 3   atenolol (TENORMIN) 25 MG tablet Take 1 tablet (25 mg total) by mouth daily. 30 tablet 1   diclofenac Sodium (VOLTAREN) 1 % GEL Apply 1 application. topically daily as needed (arthritis pain).     DULoxetine (CYMBALTA) 20 MG capsule Take 20 mg by mouth daily.     levothyroxine (SYNTHROID) 75 MCG tablet Take 1 tablet (75 mcg total) by mouth daily before breakfast. 30 tablet 3   lidocaine-prilocaine (EMLA) cream Apply 1 application topically daily as needed (port access). 30 g 3   Magnesium Hydroxide (DULCOLAX SOFT CHEWS PO) Take 2 each by mouth daily as needed (constipation).     mometasone (NASONEX) 50 MCG/ACT nasal spray Place 2 sprays into the nose daily as needed (allergies).  ondansetron (ZOFRAN) 8 MG tablet TAKE 1 TABLET BY MOUTH TWICE DAILY AS NEEDED FOR NAUSEA & VOMITING 30 tablet 1   traMADol (ULTRAM) 50 MG tablet Take 1 tablet (50 mg total) by mouth every 6 (six) hours as needed.  30 tablet 0   No current facility-administered medications for this visit.   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  She has bloating and early satiety. Shortness of breath with walking. She denied bleeding. She reported constipation with pain with defecation and narrowed stools   11/29/2018 Imaging   1. 13 cm complex cystic lesion in the central pelvis, highly suspicious for ovarian cystadenocarcinoma. 2. Diffuse peritoneal carcinomatosis with mild ascites. 3. Mild lymphadenopathy in porta hepatis and right cardiophrenic angle, suspicious for metastatic disease. 4. Moderate right and tiny left pleural effusions   12/05/2018 Tumor Marker   Patient's tumor was tested for the following markers: CA-125 Results of the tumor marker test revealed 1015.   12/06/2018 Cancer Staging   Staging form: Ovary, Fallopian Tube, and Primary Peritoneal Carcinoma, AJCC 8th Edition - Clinical: FIGO Stage IVA, calculated as Stage IV (cT3c, cN1, cM1) - Signed by [name], MD on 12/06/2018   12/09/2018 Pathology Results   PLEURAL FLUID, RIGHT (SPECIMEN 1 OF 1 COLLECTED 12/09/18): - MALIGNANT CELLS CONSISTENT WITH METASTATIC ADENOCARCINOMA - SEE COMMENT Comment The neoplastic cells are positive for cytokeratin 7 and Pax-8 but negative for cytokeratin 20, TTF-1, CDX-2 and Gata-3. Overall, the phenotype is consistent with the clinical impression of gynecologic primary.    12/15/2018 Procedure   Placement of single lumen port a cath via right internal jugular vein.  The catheter tip lies at the cavo-atrial junction. A power injectable port a cath was placed and is ready for immediate use   12/16/2018 - 08/24/2019 Chemotherapy   The patient had carboplatin and taxol for chemotherapy treatment.  She had 7 cycles given neoadjuvant prior to surgery and 3 more cycles after surgery, for a total of 10 cycles of treatment    01/06/2019 Tumor Marker   Patient's tumor was tested for the following markers: CA-125 Results of the tumor marker test revealed 948   02/02/2019 Imaging   1. Massive pulmonary embolism, as discussed above. Given the mildly elevated RV to LV ratio of 0.95, this is associated with increased risk of morbidity and mortality. 2. Today's study demonstrates a mixed response to therapy. Specifically, while there has been regression of the bulky intraperitoneal metastatic disease and regression of previously noted pleural effusions, the large cystic mass in the central pelvis has increased in size compared to the prior study. 3. New right mild hydroureteronephrosis related to extrinsic compression on the distal third of the right ureter by the patient's large pelvic mass. 4. Scattered small pulmonary nodules (predominantly pleural based) appear stable compared to prior examinations. These are nonspecific but warrant continued attention on follow-up studies. 5. Aortic atherosclerosis, in addition to three-vessel coronary artery disease. Assessment for potential risk factor modification, dietary therapy or pharmacologic therapy may be warranted, if clinically indicated.   02/02/2019 -  02/04/2019 Hospital Admission   She was admitted the hospital due to DVT and PE   02/03/2019 Imaging   Bilateral venous Doppler US Right: Findings consistent with acute deep vein thrombosis involving the right femoral vein, right popliteal vein, right peroneal veins, right soleal veins, and right gastrocnemius veins. No cystic structure found in the popliteal fossa. Left: There is  no evidence of deep vein thrombosis in the lower extremity. No cystic structure found in the popliteal fossa   02/17/2019 Tumor Marker   Patient's tumor was tested for the following markers: CA-125 Results of the tumor marker test revealed 127   03/10/2019 Tumor Marker   Patient's tumor was tested for the following markers: CA-125. Results of the tumor marker test revealed 87.4   03/14/2019 - 03/16/2019 Hospital Admission   She was admitted to the hospital recently for weakness   03/31/2019 Tumor Marker   Patient's tumor was tested for the following markers: CA-125 Results of the tumor marker test revealed 59.2.   04/28/2019 Tumor Marker   Patient's tumor was tested for the following markers: CA-125 Results of the tumor marker test revealed 56.8   05/05/2019 Imaging   1. Interval decrease in size of the large cystic mass in the central pelvis. Mesenteric and omental soft tissue disease shows no substantial interval change. 2. The mild right hydroureteronephrosis seen previously has resolved in the interval. 3. Small residual nonobstructive thrombus identified in the inter lobar pulmonary artery common here into the lateral wall compatible with chronicity. 4. 14 mm subtle enhancing lesion in the anterior right liver is stable. This may be vascular malformation. Attention on follow-up recommended. 5. Stable appearance of the multiple small bilateral pulmonary nodules. Continued attention on follow-up recommended. 6.  Aortic Atherosclerois (ICD10-170.0)     06/06/2019 Pathology Results   A. OMENTUM, RESECTION: - Metastatic carcinoma. B. RIGHT FALLOPIAN TUBE AND OVARY, SALPINGOOOPHORECTOMY: - High-grade serous carcinoma, spanning 9 cm. - No surface involvement identified. - Fallopian tube involved by carcinoma. - See oncology table. C. PERITONEAL NODULE, EXCISION: - Metastatic carcinoma. ONCOLOGY TABLE: OVARY or FALLOPIAN TUBE or PRIMARY PERITONEUM: Procedure: Right  salpingo-oophorectomy, omental resection, and peritoneal biopsy. Specimen Integrity: Intact Tumor Site: Right ovary Ovarian Surface Involvement (required only if applicable): Not identified Tumor Size: 9 cm Histologic Type: High-grade serous carcinoma Histologic Grade: High-grade Implants (required for advanced stage serous/seromucinous borderline tumors only): Omentum, peritoneum. Other Tissue/ Organ Involvement: Right fallopian tube Largest Extrapelvic Peritoneal Focus (required only if applicable): 7.3 cm Peritoneal/Ascitic Fluid: Positive pleural fluid pre neoadjuvant therapy. Treatment Effect (required only for high-grade serous carcinomas): Probable treatment effect present. Regional Lymph Nodes: No lymph nodes submitted or found Pathologic Stage Classification (pTNM, AJCC 8th Edition): ypT3c, ypNX Representative Tumor Block: B2   06/06/2019 Surgery   Preoperative Diagnosis: stage IV ovarian cancer, s/p neoadjuvant chemotherapy, history of recent PE.  Procedure(s) Performed: Exploratory laparotomy with right salpingo-oophorectomy, omentectomy radical tumor debulking for ovarian cancer .   Surgeon: Thereasa Solo, MD.    Operative Findings:  Omental cake adherent to anterior abdominal wall and hepatic flexure. 10cm right tube and ovary. Surgically absent uterus and left tube and ovary. Granular nodularity across right diaphragm.    This represented an optimal cytoreduction (R0) with no gross visible disease remaining.    07/06/2019 Tumor Marker   Patient's tumor was tested for the following markers:CA-125 Results of the tumor marker test revealed 16.4   08/10/2019 Genetic Testing   Negative genetic testing. No pathogenic variants identified. VUS in BRCA2  called [id number] identified on the [employer] CancerNext+RNAinsight panel. The report date is 08/10/2019.  The CancerNext+RNAinsight gene panel offered by [employer] includes sequencing and rearrangement analysis for the following 36  genes: APC*, ATM*, AXIN2, BARD1, BMPR1A, BRCA1*, BRCA2*, BRIP1*, CDH1*, CDK4, CDKN2A, CHEK2*, DICER1, MLH1*, MSH2*, MSH3, MSH6*, MUTYH*, NBN, NF1*, NTHL1, PALB2*, PMS2*, PTEN*, RAD51C*, RAD51D*, RECQL, SMAD4, SMARCA4, STK11 and TP53* (sequencing and deletion/duplication); HOXB13, POLD1 and POLE (sequencing only); EPCAM and GREM1 (deletion/duplication only). DNA and RNA analyses performed for * genes.   HRD (somatic) testing was initially ordered and was not completed due to insufficient amount of tumor sample.    08/24/2019 Tumor Marker   Patient's tumor was tested for the following markers: CA-125 Results of the tumor marker test revealed 12.2   09/20/2019 Imaging   1. Interval debulking, bilateral salpingo oophorectomy and omentectomy with resection of the dominant pelvic cystic lesion and the bandlike soft tissue seen previously in the right mesentery/omentum. Today's study is status shows new postoperative baseline for follow-up. 2. 6 mm gastrohepatic ligament nodule seen on the previous study is stable. Continued attention on follow-up recommended. 3. No new or progressive findings in the chest, abdomen, or pelvis to suggest disease progression. 4. Stable 1.4 cm focus of homogeneous enhancement in the anterior right liver. Stable since at least 11/29/2018 and likely benign. Continued attention on follow-up recommended. 5. Aortic Atherosclerosis (ICD10-I70.0).   09/20/2019 Tumor Marker   Patient's tumor was tested for the following markers: CA-125 Results of the tumor marker test revealed 11.9   12/26/2019 Tumor Marker   Patient's tumor was tested for the following markers: CA-125 Results of the tumor marker test revealed 16.5   03/25/2020 Imaging   1. Interval progression of peritoneal disease which predominantly involves the serosal surface of the proximal and distal transverse colon, and descending colon. 2. New bilateral inguinal adenopathy. 3. No ascites. 4. Aortic atherosclerosis.    03/25/2020 Tumor Marker   Patient's tumor was tested for the following markers: CA-125 Results of the tumor marker test revealed 249   03/28/2020 Echocardiogram    1. Borderline LV Strain; A2C view represents the most accurate acquisition.. Left ventricular ejection fraction, by estimation, is 60 to 65%. Left ventricular ejection fraction by PLAX is 63 %. The left ventricle has normal function. The left ventricle demonstrates regional wall motion abnormalities (see scoring diagram/findings for description). Left ventricular diastolic parameters were normal.  2. Right ventricular systolic function is normal. The right ventricular size is normal.  3. Left atrial size was mildly dilated.  4. The mitral valve is normal in structure. No evidence of mitral valve regurgitation.  5. The aortic valve is grossly normal. Aortic valve regurgitation is mild.  6. The inferior vena cava is normal in size with greater than 50% respiratory variability, suggesting right atrial pressure of 3 mmHg.   04/02/2020 Tumor Marker   Patient's tumor was tested for the following markers: CA-125 Results of the tumor marker test revealed 304   04/02/2020 - 08/22/2020 Chemotherapy   The patient had carboplatin and Doxil for chemotherapy treatment.     05/27/2020 Tumor Marker   Patient's tumor was tested for the following markers: CA-125. Results of the tumor marker test revealed 42.7   06/10/2020 Imaging   Improving peritoneal disease and serosal implants along the left colon, as above.   Improving bilateral inguinal nodes.   06/18/2020 Echocardiogram    1. Left ventricular ejection fraction, by estimation, is 60 to 65%. The left ventricle has normal function.  The left ventricle has no regional wall motion abnormalities. There is mild left ventricular hypertrophy of the basal-septal segment. Left ventricular diastolic parameters were normal. The average left ventricular global longitudinal strain is -25.0 %. The global  longitudinal strain is normal.  2. Right ventricular systolic function is normal. The right ventricular size is normal.  3. The mitral valve is grossly normal. Mild mitral valve regurgitation.  4. Tricuspid valve regurgitation is moderate.  5. The aortic valve is tricuspid. There is mild calcification of the aortic valve. There is mild thickening of the aortic valve. Aortic valve regurgitation is mild to moderate.  6. The inferior vena cava is normal in size with <50% respiratory variability, suggesting right atrial pressure of 8 mmHg.   06/25/2020 Tumor Marker   Patient's tumor was tested for the following markers: CA-125 Results of the tumor marker test revealed 23.9   07/15/2020 Imaging   IMPRESSION: 1. Mild amount of acute pulmonary embolism within multiple middle lobe and lower lobe branches of the right pulmonary artery. 2.   Small amount of chronic right pulmonary embolism. 3.   Mild posterior left basilar atelectasis and/or infiltrate. 4.   Small left pleural effusion. 5. Aortic atherosclerosis.   Aortic Atherosclerosis (ICD10-I70.0).     07/15/2020 - 07/17/2020 Hospital Admission   She was admitted to a local hospital after presentation with chest pain and shortness of breath and was found to have significant pulmonary embolism She was anticoagulated and discharged   07/16/2020 Echocardiogram   1. Left ventricular ejection fraction, by estimation, is 60 to 65%. The left ventricle has normal function. The left ventricle has no regional wall motion abnormalities. There is mild left ventricular hypertrophy.  Left ventricular diastolic parameters are indeterminate.   2. Right ventricular systolic function is normal. The right ventricular size is normal. There is normal pulmonary artery systolic pressure. The estimated right ventricular systolic pressure is 74.7 mmHg.   3. The mitral valve is grossly normal. Trivial mitral valve regurgitation.   4. The aortic valve is tricuspid. Aortic  valve regurgitation is mild.   5. The inferior vena cava is normal in size with greater than 50% respiratory variability, suggesting right atrial pressure of 3 mmHg.    07/25/2020 Tumor Marker   Patient's tumor was tested for the following markers: CA-125 Results of the tumor marker test revealed 36.4   09/09/2020 Imaging   1. Mild improvement in peritoneal and serosal metastasis along the splenic flexure and descending colon. 2. Inguinal lymph nodes are slightly increased in size. Lymph nodes increased in size from 06/10/2020 and similar to CT of 03/25/2020. 3. No new peritoneal disease.  No free fluid   09/21/2020 - 10/21/2020 Chemotherapy   She started taking Niraparib, discontinued due to intolerable side effects from headache and hypertension       10/01/2020 Tumor Marker   Patient's tumor was tested for the following markers: CA-125 Results of the tumor marker test revealed 83.4.   10/16/2020 Imaging   No acute findings in the abdomen or pelvis.   Hepatic steatosis.   Large stool burden throughout the colon.       12/03/2020 Tumor Marker   Patient's tumor was tested for the following markers: CA-125 Results of the tumor marker test revealed 146   12/20/2020 Imaging   1. Signs of peritoneal carcinomatosis. No significant interval change from previous exam. 2. No findings of solid organ metastasis or nodal metastasis within the abdomen or pelvis. Enlarged right inguinal node is mildly increased  in size. Stable enlarged left inguinal node. 3. No evidence for metastatic disease to the chest. 4. Aortic atherosclerosis. Coronary artery calcifications.   12/27/2020 - 03/07/2021 Chemotherapy    Patient is on PACLITAXEL       02/07/2021 Tumor Marker   Patient's tumor was tested for the following markers: CA-125. Results of the tumor marker test revealed 395.   02/28/2021 Tumor Marker   Patient's tumor was tested for the following markers: CA-125. Results of the tumor marker test  revealed 548.   03/20/2021 Imaging   1. Interval enlargement of multiple peritoneal soft tissue masses and nodules. 2. Interval enlargement of portacaval lymph nodes as well as slight interval enlargement of bilateral inguinal lymph nodes. 3. Findings are consistent with worsened nodal and peritoneal metastatic disease. 4. Status post hysterectomy.   Aortic Atherosclerosis (ICD10-I70.0).   03/27/2021 Echocardiogram   1. Left ventricular ejection fraction, by estimation, is 60 to 65%. The left ventricle has normal function. The left ventricle has no regional wall motion abnormalities. There is mild asymmetric left ventricular hypertrophy of the basal-septal segment. Left ventricular diastolic parameters were normal.  2. Right ventricular systolic function is normal. The right ventricular size is normal.  3. The mitral valve is normal in structure. Trivial mitral valve regurgitation. No evidence of mitral stenosis.  4. The aortic valve is tricuspid. Aortic valve regurgitation is moderate. No aortic stenosis is present.  5. The inferior vena cava is normal in size with greater than 50% respiratory variability, suggesting right atrial pressure of 3 mmHg.   03/28/2021 -  Chemotherapy   Patient is on Treatment Plan : OVARIAN RECURRENT Liposomal Doxorubicin + Carboplatin q28d X 6 Cycles     03/28/2021 Tumor Marker   Patient's tumor was tested for the following markers: CA-125. Results of the tumor marker test revealed 991.   04/28/2021 Tumor Marker   Patient's tumor was tested for the following markers: CA-125. Results of the tumor marker test revealed 992.   06/16/2021 Imaging   Improving abdominopelvic lymphadenopathy, including a dominant 16 mm short axis right inguinal node, as above.   Improving peritoneal carcinomatosis, including a dominant 1.2 x 4.6 cm implant beneath the right mid abdominal wall.   06/25/2021 - 07/01/2021 Hospital Admission   She was hospitalized for subacute bowel  obstruction, managed conservatively   06/30/2021 Echocardiogram   1. Left ventricular ejection fraction, by estimation, is 60 to 65%. The left ventricle has normal function. The left ventricle has no regional wall motion abnormalities. There is mild left ventricular hypertrophy. Left ventricular diastolic parameters are consistent with Grade I diastolic dysfunction (impaired relaxation). The average left ventricular global longitudinal strain is -18.2 %. The global longitudinal strain is normal.   2. Right ventricular systolic function is normal. The right ventricular size is normal. Tricuspid regurgitation signal is inadequate for assessing PA pressure.   3. The mitral valve is normal in structure. Trivial mitral valve regurgitation. No evidence of mitral stenosis.   4. The aortic valve is tricuspid. Aortic valve regurgitation is mild to moderate. No aortic stenosis is present.    09/24/2021 Imaging   1. Peritoneal carcinomatosis, stable from December 2022. 2. Right inguinal adenopathy has decreased in size slightly in the interval. 3. Tiny right pleural effusion, increased. 4. Aortic atherosclerosis (ICD10-I70.0). Coronary artery calcification.   10/27/2021 Echocardiogram    1. No significant change in global longitudinal strain. Left ventricular ejection fraction, by estimation, is 55 to 60%. The left ventricle has normal function. The left ventricle  has no regional wall motion abnormalities. Left ventricular diastolic parameters are consistent with Grade I diastolic dysfunction (impaired relaxation). The average left ventricular global longitudinal strain is -16.8 %. The global longitudinal strain is normal.  2. Right ventricular systolic function is normal. The right ventricular size is normal. There is normal pulmonary artery systolic pressure.  3. Left atrial size was mild to moderately dilated.  4. The mitral valve is degenerative. Mild to moderate mitral valve regurgitation.  5. The aortic  valve is tricuspid. Aortic valve regurgitation is mild. Aortic valve sclerosis/calcification is present, without any evidence of aortic stenosis.  6. The inferior vena cava is normal in size with greater than 50% respiratory variability, suggesting right atrial pressure of 3 mmHg.   11/06/2021 Imaging   1. Mildly distended fluid-filled small bowel in the pelvis with fecalization of enteric contents and subtle adjacent edema in the subtending mesentery. No discrete transition zone evident. Imaging features are compatible with small bowel stricture or evolving in this patient with a reported history small bowel obstruction. 2. Stable appearance of the right omental soft tissue lesion with scattered peritoneal and mesenteric nodules of ovarian cancer. 3. Small right pleural effusion with right lower lobe atelectasis. 4. Aortic Atherosclerosis (ICD10-I70.0).     PHYSICAL EXAMINATION: ECOG PERFORMANCE STATUS: 1 - Symptomatic but completely ambulatory  Vitals:   12/08/21 1105  BP: (!) 138/56  Pulse: 74  Resp: 18  Temp: 99.2 F (37.3 C)  SpO2: 97%   Filed Weights   12/08/21 1105  Weight: 217 lb 3.2 oz (98.5 kg)    GENERAL:alert, no distress and comfortable NEURO: alert & oriented x 3 with fluent speech, no focal motor/sensory deficits  LABORATORY DATA:  I have reviewed the data as listed    Component Value Date/Time   NA 136 12/08/2021 1038   K 3.9 12/08/2021 1038   CL 100 12/08/2021 1038   CO2 30 12/08/2021 1038   GLUCOSE 137 (H) 12/08/2021 1038   BUN 14 12/08/2021 1038   CREATININE 1.14 (H) 12/08/2021 1038   CALCIUM 10.0 12/08/2021 1038   PROT 6.6 12/08/2021 1038   ALBUMIN 3.6 12/08/2021 1038   AST 18 12/08/2021 1038   ALT 23 12/08/2021 1038   ALKPHOS 119 12/08/2021 1038   BILITOT 0.3 12/08/2021 1038   GFRNONAA 49 (L) 12/08/2021 1038   GFRAA >60 03/25/2020 1144    No results found for: "SPEP", "UPEP"  Lab Results  Component Value Date   WBC 3.2 (L) 12/08/2021    NEUTROABS 1.7 12/08/2021   HGB 9.0 (L) 12/08/2021   HCT 26.8 (L) 12/08/2021   MCV 111.7 (H) 12/08/2021   PLT 156 12/08/2021      Chemistry      Component Value Date/Time   NA 136 12/08/2021 1038   K 3.9 12/08/2021 1038   CL 100 12/08/2021 1038   CO2 30 12/08/2021 1038   BUN 14 12/08/2021 1038   CREATININE 1.14 (H) 12/08/2021 1038      Component Value Date/Time   CALCIUM 10.0 12/08/2021 1038   ALKPHOS 119 12/08/2021 1038   AST 18 12/08/2021 1038   ALT 23 12/08/2021 1038   BILITOT 0.3 12/08/2021 1038

## 2021-12-08 NOTE — Assessment & Plan Note (Signed)
She is on chronic anticoagulation therapy She has occasional vaginal spotting Observe closely

## 2021-12-08 NOTE — Assessment & Plan Note (Signed)
She has mild intermittent elevated serum creatinine We will observe closely and adjust the dose of carboplatin accordingly

## 2021-12-08 NOTE — Assessment & Plan Note (Signed)
She had recent sensation of dysuria I will order urinalysis and urine culture It could be related to vaginal dryness related to estrogen deficiency

## 2021-12-08 NOTE — Assessment & Plan Note (Signed)
She is recovering well from recent hospitalization She will continue treatment as scheduled She does not need repeat imaging study until August

## 2021-12-08 NOTE — Assessment & Plan Note (Signed)
This is due to recent treatment She is not symptomatic We will proceed with treatment without delay 

## 2021-12-10 ENCOUNTER — Telehealth: Payer: Self-pay

## 2021-12-10 ENCOUNTER — Other Ambulatory Visit: Payer: Self-pay

## 2021-12-10 DIAGNOSIS — C800 Disseminated malignant neoplasm, unspecified: Secondary | ICD-10-CM | POA: Diagnosis not present

## 2021-12-10 DIAGNOSIS — N1832 Chronic kidney disease, stage 3b: Secondary | ICD-10-CM | POA: Diagnosis not present

## 2021-12-10 LAB — URINE CULTURE: Culture: 20000 — AB

## 2021-12-10 MED ORDER — CEPHALEXIN 500 MG PO CAPS: 500.0000 mg | ORAL_CAPSULE | Freq: Three times a day (TID) | ORAL | 0 refills | Status: AC

## 2021-12-10 NOTE — Telephone Encounter (Signed)
Called and told urine culture positive for UTI. Keflex sent to preferred pharmacy. She will start antibiotic today.

## 2021-12-18 ENCOUNTER — Other Ambulatory Visit: Payer: Self-pay | Admitting: Hematology and Oncology

## 2021-12-18 ENCOUNTER — Telehealth: Payer: Self-pay | Admitting: *Deleted

## 2021-12-18 DIAGNOSIS — C561 Malignant neoplasm of right ovary: Secondary | ICD-10-CM

## 2021-12-18 DIAGNOSIS — M545 Low back pain, unspecified: Secondary | ICD-10-CM

## 2021-12-18 MED ORDER — OXYCODONE HCL 5 MG PO TABS: 5.0000 mg | ORAL_TABLET | ORAL | 0 refills | Status: DC | PRN

## 2021-12-18 NOTE — Telephone Encounter (Signed)
Patient called. Having back pain, worse than in past. Also having headaches. She said it seems to be worse since last chemotherapy on 12/08/21. Using heat on back. Taking Ultram, but it only helps a little. She wondered if Dr. Alvy Bimler could prescribe anything or think of anything else to help. In past she has taken prednisone for back pain.  FYI - completed Keflex for UTI 12/17/21 and states no pain w/urination or urine odor. She is also drinking cranberry juice.  Message routed to Dr. Alvy Bimler

## 2021-12-18 NOTE — Telephone Encounter (Signed)
She has lumbar compression on her CT There is nothing I can do to fix it If Ultram is not helpful, we can prescribe stronger pain meds Prednisone can only help short term Ask if she wants stronger pain meds

## 2021-12-19 ENCOUNTER — Other Ambulatory Visit: Payer: Self-pay

## 2021-12-19 ENCOUNTER — Inpatient Hospital Stay (HOSPITAL_COMMUNITY)
Admission: EM | Admit: 2021-12-19 | Discharge: 2021-12-24 | DRG: 374 | Disposition: A | Discharge status: Home Health | Payer: Medicare Other | Attending: Internal Medicine | Admitting: Internal Medicine

## 2021-12-19 ENCOUNTER — Telehealth: Payer: Self-pay | Admitting: *Deleted

## 2021-12-19 ENCOUNTER — Encounter (HOSPITAL_COMMUNITY): Payer: Self-pay

## 2021-12-19 DIAGNOSIS — Z8673 Personal history of transient ischemic attack (TIA), and cerebral infarction without residual deficits: Secondary | ICD-10-CM | POA: Diagnosis not present

## 2021-12-19 DIAGNOSIS — D649 Anemia, unspecified: Secondary | ICD-10-CM | POA: Diagnosis not present

## 2021-12-19 DIAGNOSIS — Z6834 Body mass index (BMI) 34.0-34.9, adult: Secondary | ICD-10-CM | POA: Diagnosis not present

## 2021-12-19 DIAGNOSIS — E669 Obesity, unspecified: Secondary | ICD-10-CM | POA: Diagnosis present

## 2021-12-19 DIAGNOSIS — Z90710 Acquired absence of both cervix and uterus: Secondary | ICD-10-CM

## 2021-12-19 DIAGNOSIS — E039 Hypothyroidism, unspecified: Secondary | ICD-10-CM | POA: Diagnosis not present

## 2021-12-19 DIAGNOSIS — G8929 Other chronic pain: Secondary | ICD-10-CM | POA: Diagnosis not present

## 2021-12-19 DIAGNOSIS — D696 Thrombocytopenia, unspecified: Secondary | ICD-10-CM

## 2021-12-19 DIAGNOSIS — K6389 Other specified diseases of intestine: Secondary | ICD-10-CM | POA: Diagnosis not present

## 2021-12-19 DIAGNOSIS — K56609 Unspecified intestinal obstruction, unspecified as to partial versus complete obstruction: Secondary | ICD-10-CM

## 2021-12-19 DIAGNOSIS — M810 Age-related osteoporosis without current pathological fracture: Secondary | ICD-10-CM | POA: Diagnosis present

## 2021-12-19 DIAGNOSIS — R262 Difficulty in walking, not elsewhere classified: Secondary | ICD-10-CM | POA: Diagnosis present

## 2021-12-19 DIAGNOSIS — M4856XA Collapsed vertebra, not elsewhere classified, lumbar region, initial encounter for fracture: Secondary | ICD-10-CM | POA: Diagnosis present

## 2021-12-19 DIAGNOSIS — E8809 Other disorders of plasma-protein metabolism, not elsewhere classified: Secondary | ICD-10-CM | POA: Diagnosis not present

## 2021-12-19 DIAGNOSIS — M4854XA Collapsed vertebra, not elsewhere classified, thoracic region, initial encounter for fracture: Secondary | ICD-10-CM | POA: Diagnosis not present

## 2021-12-19 DIAGNOSIS — Z66 Do not resuscitate: Secondary | ICD-10-CM | POA: Diagnosis not present

## 2021-12-19 DIAGNOSIS — M4855XA Collapsed vertebra, not elsewhere classified, thoracolumbar region, initial encounter for fracture: Secondary | ICD-10-CM | POA: Diagnosis not present

## 2021-12-19 DIAGNOSIS — T451X5A Adverse effect of antineoplastic and immunosuppressive drugs, initial encounter: Secondary | ICD-10-CM | POA: Diagnosis present

## 2021-12-19 DIAGNOSIS — M48061 Spinal stenosis, lumbar region without neurogenic claudication: Secondary | ICD-10-CM | POA: Diagnosis not present

## 2021-12-19 DIAGNOSIS — B9620 Unspecified Escherichia coli [E. coli] as the cause of diseases classified elsewhere: Secondary | ICD-10-CM | POA: Diagnosis present

## 2021-12-19 DIAGNOSIS — C561 Malignant neoplasm of right ovary: Secondary | ICD-10-CM | POA: Diagnosis present

## 2021-12-19 DIAGNOSIS — I2699 Other pulmonary embolism without acute cor pulmonale: Secondary | ICD-10-CM | POA: Diagnosis present

## 2021-12-19 DIAGNOSIS — N390 Urinary tract infection, site not specified: Secondary | ICD-10-CM | POA: Diagnosis not present

## 2021-12-19 DIAGNOSIS — M1990 Unspecified osteoarthritis, unspecified site: Secondary | ICD-10-CM

## 2021-12-19 DIAGNOSIS — S32049A Unspecified fracture of fourth lumbar vertebra, initial encounter for closed fracture: Secondary | ICD-10-CM | POA: Diagnosis not present

## 2021-12-19 DIAGNOSIS — K56600 Partial intestinal obstruction, unspecified as to cause: Secondary | ICD-10-CM | POA: Diagnosis present

## 2021-12-19 DIAGNOSIS — R590 Localized enlarged lymph nodes: Secondary | ICD-10-CM | POA: Diagnosis present

## 2021-12-19 DIAGNOSIS — Z7901 Long term (current) use of anticoagulants: Secondary | ICD-10-CM

## 2021-12-19 DIAGNOSIS — K219 Gastro-esophageal reflux disease without esophagitis: Secondary | ICD-10-CM | POA: Diagnosis present

## 2021-12-19 DIAGNOSIS — D61811 Other drug-induced pancytopenia: Secondary | ICD-10-CM | POA: Diagnosis not present

## 2021-12-19 DIAGNOSIS — I129 Hypertensive chronic kidney disease with stage 1 through stage 4 chronic kidney disease, or unspecified chronic kidney disease: Secondary | ICD-10-CM | POA: Diagnosis not present

## 2021-12-19 DIAGNOSIS — S32030A Wedge compression fracture of third lumbar vertebra, initial encounter for closed fracture: Secondary | ICD-10-CM | POA: Diagnosis not present

## 2021-12-19 DIAGNOSIS — Z86711 Personal history of pulmonary embolism: Secondary | ICD-10-CM

## 2021-12-19 DIAGNOSIS — E876 Hypokalemia: Secondary | ICD-10-CM | POA: Diagnosis not present

## 2021-12-19 DIAGNOSIS — R109 Unspecified abdominal pain: Secondary | ICD-10-CM | POA: Diagnosis not present

## 2021-12-19 DIAGNOSIS — Z800 Family history of malignant neoplasm of digestive organs: Secondary | ICD-10-CM

## 2021-12-19 DIAGNOSIS — S22080A Wedge compression fracture of T11-T12 vertebra, initial encounter for closed fracture: Secondary | ICD-10-CM | POA: Diagnosis not present

## 2021-12-19 DIAGNOSIS — K5939 Other megacolon: Secondary | ICD-10-CM | POA: Diagnosis not present

## 2021-12-19 DIAGNOSIS — N183 Chronic kidney disease, stage 3 unspecified: Secondary | ICD-10-CM | POA: Diagnosis present

## 2021-12-19 DIAGNOSIS — R7989 Other specified abnormal findings of blood chemistry: Secondary | ICD-10-CM | POA: Diagnosis present

## 2021-12-19 DIAGNOSIS — Z79899 Other long term (current) drug therapy: Secondary | ICD-10-CM

## 2021-12-19 DIAGNOSIS — M5136 Other intervertebral disc degeneration, lumbar region: Secondary | ICD-10-CM | POA: Diagnosis not present

## 2021-12-19 DIAGNOSIS — C786 Secondary malignant neoplasm of retroperitoneum and peritoneum: Principal | ICD-10-CM | POA: Diagnosis present

## 2021-12-19 DIAGNOSIS — R112 Nausea with vomiting, unspecified: Secondary | ICD-10-CM | POA: Diagnosis not present

## 2021-12-19 DIAGNOSIS — Z79890 Hormone replacement therapy (postmenopausal): Secondary | ICD-10-CM

## 2021-12-19 MED ORDER — ONDANSETRON HCL 4 MG/2ML IJ SOLN
4.0000 mg | Freq: Once | INTRAMUSCULAR | Status: AC
  Administered 2021-12-19: 4 mg via INTRAVENOUS
  Filled 2021-12-19: qty 2

## 2021-12-19 MED ORDER — FENTANYL CITRATE PF 50 MCG/ML IJ SOSY
100.0000 ug | PREFILLED_SYRINGE | Freq: Once | INTRAMUSCULAR | Status: AC
  Administered 2021-12-19: 100 ug via INTRAVENOUS
  Filled 2021-12-19: qty 2

## 2021-12-19 MED ORDER — FENTANYL CITRATE PF 50 MCG/ML IJ SOSY: 50.0000 ug | PREFILLED_SYRINGE | Freq: Once | INTRAMUSCULAR | Status: DC

## 2021-12-19 NOTE — ED Provider Triage Note (Signed)
.  Emergency Medicine Provider Triage Evaluation Note  Paula Peterson , a 81 y.o. female  was evaluated in triage.  Pt complains of abdominal sx. states that about 3 days ago she started experiencing worsening lower back pain she says that today and yesterday she noticed worsening nausea.  Today she started having vomiting and generalized severe abdominal pain.  She has a history of a bowel obstruction and thinks that it feels very similar to this.  She said her last bowel movement was this morning and it was normal.  She has a history of ovarian cancer that has metastasized and she is on chemotherapy.  She has a history of lower back pain, but this is acutely worsened from her baseline.  She denies any numbness, weakness, saddle anesthesia, bowel or bladder incontinence or retention, fevers, chills.  Review of Systems  Positive: Back pain, nausea, vomiting, abdominal pain Negative:   Physical Exam  BP 138/77 (BP Location: Left Arm)   Pulse 86   Temp 98.4 F (36.9 C) (Oral)   Resp 18   Ht 5\' 7"  (1.702 m)   Wt 98.5 kg   SpO2 93%   BMI 34.01 kg/m  Gen:   Awake, no distress   Resp:  Normal effort  MSK:   Moves extremities without difficulty  Other:  Abdomen is soft but generalized tenderness.  No guarding or rigidity.  Reproducible L-spine midline tenderness to palpation.  She moves all extremities without difficulty.  No focal motor deficits noted.  Medical Decision Making  Medically screening exam initiated at 10:30 PM.  Appropriate orders placed.  Tomya Thome Barsch was informed that the remainder of the evaluation will be completed by another provider, this initial triage assessment does not replace that evaluation, and the importance of remaining in the ED until their evaluation is complete.     Claudie Leach, [place] 12/19/21 2232

## 2021-12-20 ENCOUNTER — Emergency Department (HOSPITAL_COMMUNITY): Payer: Medicare Other

## 2021-12-20 DIAGNOSIS — D696 Thrombocytopenia, unspecified: Secondary | ICD-10-CM | POA: Diagnosis not present

## 2021-12-20 DIAGNOSIS — M48061 Spinal stenosis, lumbar region without neurogenic claudication: Secondary | ICD-10-CM | POA: Diagnosis not present

## 2021-12-20 DIAGNOSIS — M4855XA Collapsed vertebra, not elsewhere classified, thoracolumbar region, initial encounter for fracture: Secondary | ICD-10-CM | POA: Diagnosis present

## 2021-12-20 DIAGNOSIS — D649 Anemia, unspecified: Secondary | ICD-10-CM

## 2021-12-20 DIAGNOSIS — K219 Gastro-esophageal reflux disease without esophagitis: Secondary | ICD-10-CM | POA: Diagnosis present

## 2021-12-20 DIAGNOSIS — M5136 Other intervertebral disc degeneration, lumbar region: Secondary | ICD-10-CM | POA: Diagnosis not present

## 2021-12-20 DIAGNOSIS — I129 Hypertensive chronic kidney disease with stage 1 through stage 4 chronic kidney disease, or unspecified chronic kidney disease: Secondary | ICD-10-CM | POA: Diagnosis present

## 2021-12-20 DIAGNOSIS — Z800 Family history of malignant neoplasm of digestive organs: Secondary | ICD-10-CM | POA: Diagnosis not present

## 2021-12-20 DIAGNOSIS — E039 Hypothyroidism, unspecified: Secondary | ICD-10-CM | POA: Diagnosis present

## 2021-12-20 DIAGNOSIS — Z86711 Personal history of pulmonary embolism: Secondary | ICD-10-CM | POA: Diagnosis not present

## 2021-12-20 DIAGNOSIS — K6389 Other specified diseases of intestine: Secondary | ICD-10-CM | POA: Diagnosis not present

## 2021-12-20 DIAGNOSIS — C786 Secondary malignant neoplasm of retroperitoneum and peritoneum: Secondary | ICD-10-CM | POA: Diagnosis present

## 2021-12-20 DIAGNOSIS — R112 Nausea with vomiting, unspecified: Secondary | ICD-10-CM | POA: Diagnosis not present

## 2021-12-20 DIAGNOSIS — K56600 Partial intestinal obstruction, unspecified as to cause: Principal | ICD-10-CM

## 2021-12-20 DIAGNOSIS — G8929 Other chronic pain: Secondary | ICD-10-CM | POA: Diagnosis present

## 2021-12-20 DIAGNOSIS — R590 Localized enlarged lymph nodes: Secondary | ICD-10-CM | POA: Diagnosis not present

## 2021-12-20 DIAGNOSIS — R109 Unspecified abdominal pain: Secondary | ICD-10-CM | POA: Diagnosis present

## 2021-12-20 DIAGNOSIS — C561 Malignant neoplasm of right ovary: Secondary | ICD-10-CM | POA: Diagnosis present

## 2021-12-20 DIAGNOSIS — D61811 Other drug-induced pancytopenia: Secondary | ICD-10-CM | POA: Diagnosis present

## 2021-12-20 DIAGNOSIS — M4854XA Collapsed vertebra, not elsewhere classified, thoracic region, initial encounter for fracture: Secondary | ICD-10-CM | POA: Diagnosis present

## 2021-12-20 DIAGNOSIS — E8809 Other disorders of plasma-protein metabolism, not elsewhere classified: Secondary | ICD-10-CM | POA: Diagnosis present

## 2021-12-20 DIAGNOSIS — K5939 Other megacolon: Secondary | ICD-10-CM | POA: Diagnosis not present

## 2021-12-20 DIAGNOSIS — T451X5A Adverse effect of antineoplastic and immunosuppressive drugs, initial encounter: Secondary | ICD-10-CM | POA: Diagnosis present

## 2021-12-20 DIAGNOSIS — Z66 Do not resuscitate: Secondary | ICD-10-CM | POA: Diagnosis present

## 2021-12-20 DIAGNOSIS — R262 Difficulty in walking, not elsewhere classified: Secondary | ICD-10-CM | POA: Diagnosis present

## 2021-12-20 DIAGNOSIS — Z90710 Acquired absence of both cervix and uterus: Secondary | ICD-10-CM | POA: Diagnosis not present

## 2021-12-20 DIAGNOSIS — B9620 Unspecified Escherichia coli [E. coli] as the cause of diseases classified elsewhere: Secondary | ICD-10-CM | POA: Diagnosis present

## 2021-12-20 DIAGNOSIS — E669 Obesity, unspecified: Secondary | ICD-10-CM | POA: Diagnosis present

## 2021-12-20 DIAGNOSIS — S32049A Unspecified fracture of fourth lumbar vertebra, initial encounter for closed fracture: Secondary | ICD-10-CM | POA: Diagnosis not present

## 2021-12-20 DIAGNOSIS — M4856XA Collapsed vertebra, not elsewhere classified, lumbar region, initial encounter for fracture: Secondary | ICD-10-CM | POA: Diagnosis present

## 2021-12-20 DIAGNOSIS — Z6834 Body mass index (BMI) 34.0-34.9, adult: Secondary | ICD-10-CM | POA: Diagnosis not present

## 2021-12-20 DIAGNOSIS — M810 Age-related osteoporosis without current pathological fracture: Secondary | ICD-10-CM | POA: Diagnosis present

## 2021-12-20 DIAGNOSIS — E876 Hypokalemia: Secondary | ICD-10-CM | POA: Diagnosis present

## 2021-12-20 DIAGNOSIS — N390 Urinary tract infection, site not specified: Secondary | ICD-10-CM | POA: Diagnosis present

## 2021-12-20 DIAGNOSIS — N183 Chronic kidney disease, stage 3 unspecified: Secondary | ICD-10-CM | POA: Diagnosis present

## 2021-12-20 DIAGNOSIS — Z8673 Personal history of transient ischemic attack (TIA), and cerebral infarction without residual deficits: Secondary | ICD-10-CM | POA: Diagnosis not present

## 2021-12-20 DIAGNOSIS — S32030A Wedge compression fracture of third lumbar vertebra, initial encounter for closed fracture: Secondary | ICD-10-CM | POA: Diagnosis not present

## 2021-12-20 LAB — CBC WITH DIFFERENTIAL/PLATELET
Abs Immature Granulocytes: 0.01 10*3/uL (ref 0.00–0.07)
Basophils Absolute: 0 10*3/uL (ref 0.0–0.1)
Basophils Relative: 1 %
Eosinophils Absolute: 0 10*3/uL (ref 0.0–0.5)
Eosinophils Relative: 1 %
HCT: 28.8 % — ABNORMAL LOW (ref 36.0–46.0)
Hemoglobin: 9.6 g/dL — ABNORMAL LOW (ref 12.0–15.0)
Immature Granulocytes: 0 %
Lymphocytes Relative: 28 %
Lymphs Abs: 1.2 10*3/uL (ref 0.7–4.0)
MCH: 37.4 pg — ABNORMAL HIGH (ref 26.0–34.0)
MCHC: 33.3 g/dL (ref 30.0–36.0)
MCV: 112.1 fL — ABNORMAL HIGH (ref 80.0–100.0)
Monocytes Absolute: 0.5 10*3/uL (ref 0.1–1.0)
Monocytes Relative: 11 %
Neutro Abs: 2.5 10*3/uL (ref 1.7–7.7)
Neutrophils Relative %: 59 %
Platelets: 121 10*3/uL — ABNORMAL LOW (ref 150–400)
RBC: 2.57 MIL/uL — ABNORMAL LOW (ref 3.87–5.11)
RDW: 15.6 % — ABNORMAL HIGH (ref 11.5–15.5)
WBC: 4.3 10*3/uL (ref 4.0–10.5)
nRBC: 0 % (ref 0.0–0.2)

## 2021-12-20 LAB — URINALYSIS, ROUTINE W REFLEX MICROSCOPIC
Bacteria, UA: NONE SEEN
Bilirubin Urine: NEGATIVE
Glucose, UA: NEGATIVE mg/dL
Hgb urine dipstick: NEGATIVE
Ketones, ur: NEGATIVE mg/dL
Nitrite: NEGATIVE
Protein, ur: NEGATIVE mg/dL
Specific Gravity, Urine: >1.046 — ABNORMAL HIGH (ref 1.005–1.030)
pH: 7 (ref 5.0–8.0)

## 2021-12-20 LAB — COMPREHENSIVE METABOLIC PANEL
ALT: 17 U/L (ref 0–44)
AST: 18 U/L (ref 15–41)
Albumin: 2.8 g/dL — ABNORMAL LOW (ref 3.5–5.0)
Alkaline Phosphatase: 84 U/L (ref 38–126)
Anion gap: 9 (ref 5–15)
BUN: 15 mg/dL (ref 8–23)
CO2: 21 mmol/L — ABNORMAL LOW (ref 22–32)
Calcium: 7.8 mg/dL — ABNORMAL LOW (ref 8.9–10.3)
Chloride: 111 mmol/L (ref 98–111)
Creatinine, Ser: 0.81 mg/dL (ref 0.44–1.00)
GFR, Estimated: >60 mL/min (ref >60)
Glucose, Bld: 101 mg/dL — ABNORMAL HIGH (ref 70–99)
Potassium: 3 mmol/L — ABNORMAL LOW (ref 3.5–5.1)
Sodium: 141 mmol/L (ref 135–145)
Total Bilirubin: 0.5 mg/dL (ref 0.3–1.2)
Total Protein: 5.4 g/dL — ABNORMAL LOW (ref 6.5–8.1)

## 2021-12-20 LAB — LIPASE, BLOOD: Lipase: 18 U/L (ref 11–51)

## 2021-12-20 LAB — MAGNESIUM: Magnesium: 1.5 mg/dL — ABNORMAL LOW (ref 1.7–2.4)

## 2021-12-20 LAB — LACTIC ACID, PLASMA: Lactic Acid, Venous: 1.7 mmol/L (ref 0.5–1.9)

## 2021-12-20 MED ORDER — SODIUM CHLORIDE 0.9 % IV SOLN: INTRAVENOUS | Status: DC

## 2021-12-20 MED ORDER — POTASSIUM CHLORIDE 10 MEQ/100ML IV SOLN
10.0000 meq | INTRAVENOUS | Status: AC
  Administered 2021-12-20 (×6): 10 meq via INTRAVENOUS
  Filled 2021-12-20 (×6): qty 100

## 2021-12-20 MED ORDER — IOHEXOL 300 MG/ML  SOLN
100.0000 mL | Freq: Once | INTRAMUSCULAR | Status: AC | PRN
  Administered 2021-12-20: 100 mL via INTRAVENOUS

## 2021-12-20 MED ORDER — MORPHINE SULFATE (PF) 2 MG/ML IV SOLN
1.0000 mg | INTRAVENOUS | Status: DC | PRN
  Administered 2021-12-20 – 2021-12-22 (×8): 1 mg via INTRAVENOUS
  Filled 2021-12-20 (×8): qty 1

## 2021-12-20 MED ORDER — CHLORHEXIDINE GLUCONATE CLOTH 2 % EX PADS
6.0000 | MEDICATED_PAD | Freq: Every day | CUTANEOUS | Status: DC
Start: 2021-12-20 — End: 2021-12-24
  Administered 2021-12-20 – 2021-12-24 (×5): 6 via TOPICAL

## 2021-12-20 MED ORDER — LEVOTHYROXINE SODIUM 100 MCG/5ML IV SOLN: 56.0000 ug | Freq: Every day | INTRAVENOUS | Status: DC

## 2021-12-20 MED ORDER — MORPHINE SULFATE (PF) 4 MG/ML IV SOLN
4.0000 mg | Freq: Once | INTRAVENOUS | Status: AC
  Administered 2021-12-20: 4 mg via INTRAVENOUS
  Filled 2021-12-20: qty 1

## 2021-12-20 MED ORDER — ONDANSETRON HCL 4 MG/2ML IJ SOLN
4.0000 mg | Freq: Four times a day (QID) | INTRAMUSCULAR | Status: DC | PRN
  Administered 2021-12-20 – 2021-12-24 (×2): 4 mg via INTRAVENOUS
  Filled 2021-12-20 (×2): qty 2

## 2021-12-20 MED ORDER — FENTANYL CITRATE PF 50 MCG/ML IJ SOSY
100.0000 ug | PREFILLED_SYRINGE | Freq: Once | INTRAMUSCULAR | Status: AC
  Administered 2021-12-20: 100 ug via INTRAVENOUS
  Filled 2021-12-20: qty 2

## 2021-12-20 MED ORDER — ONDANSETRON HCL 4 MG/2ML IJ SOLN
4.0000 mg | Freq: Once | INTRAMUSCULAR | Status: AC
  Administered 2021-12-20: 4 mg via INTRAVENOUS
  Filled 2021-12-20: qty 2

## 2021-12-20 NOTE — Progress Notes (Signed)
PROGRESS NOTE  Paula Peterson NWG:[id number] DOB: 22-Jan-1941 DOA: 12/19/2021 PCP: Richardean Chimera, MD  HPI/Recap of past 24 hours: Paula Peterson is a 81 y.o. 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 In the ED, vital signs and labs fairly stable. CT abdomen pelvis showing partial SBO with transition point in the right lower quadrant of the abdomen.  Subacute superior endplate fractures of T12 and L2 with mild loss of height and no retropulsion.  Stable superior endplate fractures of L3 and L4.  General surgery consulted.  Patient admitted for further management.   Today, patient continues to complain of some abdominal pain with some bloating, no further vomiting but some nausea.  Back pain still present, but all improved with some pain meds.  Will like to still hold off on placing an NG tube as she had a bad experience the last time.   Assessment/Plan: Principal Problem:   Partial small bowel obstruction (HCC) Active Problems:   Right ovarian epithelial cancer (HCC)   PE (pulmonary thromboembolism) (HCC)   Hypokalemia   Chronic anemia   Thrombocytopenia (HCC)   Partial SBO CT abdomen showed partial small bowel obstruction General surgery on board, plan for conservative managements for now, if failed she may need a laparotomy Refusing NG tube for now N.p.o., continue IV fluids, pain management   Subacute vertebral compression fractures CT showed interval development of subacute superior endplate fractures of T12 and L2 with mild loss of height Pain management PT/OT   Hypokalemia Replace as needed   Right ovarian epithelial cancer On chemotherapy; followed by Dr. Bertis Ruddy  History of pancytopenia Due to malignancy/chemotherapy Daily CBC   History of PE Hold  Eliquis for now due to the possibility of a laparotomy, pending surgical team   Hypothyroidism Continue Synthroid  Obesity Lifestyle modification advised     Estimated body mass index is 34.01 kg/m as calculated from the following:   Height as of this encounter: 5\' 7"  (1.702 m).   Weight as of this encounter: 98.5 kg.     Code Status: DNR  Family Communication: None at bedside  Disposition Plan: Status is: Inpatient Remains inpatient appropriate because: Level of care   Consultants: Gen surg  Procedures: None  Antimicrobials: None  DVT prophylaxis:  SCD for now, hold PTA eliquis   Objective: Vitals:   12/20/21 0845 12/20/21 0900 12/20/21 0915 12/20/21 0930  BP:  (!) 164/82  (!) 168/77  Pulse: 72 70 70 68  Resp:    16  Temp:      TempSrc:      SpO2: 92% 93% 93% 95%  Weight:      Height:        Intake/Output Summary (Last 24 hours) at 12/20/2021 1201 Last data filed at 12/20/2021 1149 Gross per 24 hour  Intake 438.2 ml  Output --  Net 438.2 ml   Filed Weights   12/19/21 2224  Weight: 98.5 kg    Exam: General: NAD  Cardiovascular: S1, S2 present Respiratory: CTAB Abdomen: Soft, +tender, nondistended, bowel sounds present Musculoskeletal: No bilateral pedal edema noted Skin: Normal Psychiatry: Normal mood    Data Reviewed: CBC: Recent Labs  Lab 12/19/21 2315  WBC 4.3  NEUTROABS 2.5  HGB 9.6*  HCT 28.8*  MCV 112.1*  PLT 121*   Basic  Metabolic Panel: Recent Labs  Lab 12/19/21 2315  NA 141  K 3.0*  CL 111  CO2 21*  GLUCOSE 101*  BUN 15  CREATININE 0.81  CALCIUM 7.8*   GFR: Estimated Creatinine Clearance: 69 mL/min (by C-G formula based on SCr of 0.81 mg/dL). Liver Function Tests: Recent Labs  Lab 12/19/21 2315  AST 18  ALT 17  ALKPHOS 84  BILITOT 0.5  PROT 5.4*  ALBUMIN 2.8*   Recent Labs  Lab 12/19/21 2315  LIPASE 18   No results for input(s): "AMMONIA" in the last 168 hours. Coagulation Profile: No results  for input(s): "INR", "PROTIME" in the last 168 hours. Cardiac Enzymes: No results for input(s): "CKTOTAL", "CKMB", "CKMBINDEX", "TROPONINI" in the last 168 hours. BNP (last 3 results) No results for input(s): "PROBNP" in the last 8760 hours. HbA1C: No results for input(s): "HGBA1C" in the last 72 hours. CBG: No results for input(s): "GLUCAP" in the last 168 hours. Lipid Profile: No results for input(s): "CHOL", "HDL", "LDLCALC", "TRIG", "CHOLHDL", "LDLDIRECT" in the last 72 hours. Thyroid Function Tests: No results for input(s): "TSH", "T4TOTAL", "FREET4", "T3FREE", "THYROIDAB" in the last 72 hours. Anemia Panel: No results for input(s): "VITAMINB12", "FOLATE", "FERRITIN", "TIBC", "IRON", "RETICCTPCT" in the last 72 hours. Urine analysis:    Component Value Date/Time   COLORURINE YELLOW 12/20/2021 0219   APPEARANCEUR CLEAR 12/20/2021 0219   LABSPEC >1.046 (H) 12/20/2021 0219   PHURINE 7.0 12/20/2021 0219   GLUCOSEU NEGATIVE 12/20/2021 0219   HGBUR NEGATIVE 12/20/2021 0219   BILIRUBINUR NEGATIVE 12/20/2021 0219   KETONESUR NEGATIVE 12/20/2021 0219   PROTEINUR NEGATIVE 12/20/2021 0219   UROBILINOGEN 0.2 04/08/2013 2040   NITRITE NEGATIVE 12/20/2021 0219   LEUKOCYTESUR TRACE (A) 12/20/2021 0219   Sepsis Labs: @LABRCNTIP (procalcitonin:4,lacticidven:4)  )No results found for this or any previous visit (from the past 240 hour(s)).    Studies: CT L-SPINE NO CHARGE  Result Date: 12/20/2021 CLINICAL DATA:  Nausea/vomiting, Bowel obstruction suspected, Abdominal pain, acute, nonlocalized EXAM: CT LUMBAR SPINE WITHOUT CONTRAST TECHNIQUE: Multidetector CT imaging of the lumbar spine was performed without intravenous contrast administration. Multiplanar CT image reconstructions were also generated. RADIATION DOSE REDUCTION: This exam was performed according to the departmental dose-optimization program which includes automated exposure control, adjustment of the mA and/or kV according to  patient size and/or use of iterative reconstruction technique. COMPARISON:  09/23/2021, 11/06/2021 FINDINGS: Segmentation: Variant anatomy with 6 non rib bearing segments of the lumbar spine. Considering the lowest rib-bearing segment as T12 and the first fully incorporated vertebral body within the sacrum as S1, the lowest non rib-bearing segments of the lumbar spine is considered L6 with partial sacralization on the right. Alignment: Mild straightening.  No listhesis. Vertebrae: Osseous structures are diffusely osteopenic. There is interval development of a a mild superior endplate fracture of L1 with minimal loss of height and no retropulsion, superior endplate fracture of L3 with 10-20% loss of height and no retropulsion, and stable superior endplate fractures of L4 and L5 with mild loss of height since immediate prior examination, but new since remote prior examination of 09/23/2021. Again, no retropulsion associated with these fractures. Fractures of L1 and L3 appears subacute in nature with trace paravertebral edema. No focal lytic or blastic bone lesions. Paraspinal and other soft tissues: Right pleural effusion is partially visualized. Trace paravertebral edema at L1 and L3 are noted in keeping with subacute superior endplate fractures. No paravertebral fluid collections are identified. Disc levels: There is intervertebral disc space narrowing and endplate remodeling  throughout Thora lumbar spine in keeping with changes of moderate to severe degenerative disc disease, most severe at L3-4 and L5-L6. Broad-based posterior disc osteophyte complex in combination with facet hypertrophy at L4-5 results in moderate to severe central canal stenosis with probable impingement of the crossing L5 nerve roots. Spinal canal is otherwise widely patent. No significant neuroforaminal narrowing. IMPRESSION: 1. Variant anatomy with 6 non rib bearing segments of the lumbar spine. Considering the lowest rib-bearing segment as  T12 and the first fully incorporated vertebral body within the sacrum as S1, the lowest non rib-bearing segments of the lumbar spine is considered L6 with partial sacralization on the right. 2. Interval development of a mild superior endplate fracture of L1 with minimal loss of height and no retropulsion. 3. Interval development of a superior endplate fracture of L3 with 10-20% loss of height and no retropulsion. 4. Stable superior endplate fractures of L4 and L5 with mild loss of height. 5. Multilevel degenerative disc disease and facet hypertrophy resulting in moderate to severe central canal stenosis at L4-5 with probable impingement of the crossing L5 nerve roots. Electronically Signed   By: Helyn Numbers M.D.   On: 12/20/2021 01:23   CT Abdomen Pelvis W Contrast  Result Date: 12/20/2021 CLINICAL DATA:  Nausea/vomiting. Bowel obstruction suspected. Abdominal pain, acute, nonlocalized EXAM: CT ABDOMEN AND PELVIS WITH CONTRAST TECHNIQUE: Multidetector CT imaging of the abdomen and pelvis was performed using the standard protocol following bolus administration of intravenous contrast. RADIATION DOSE REDUCTION: This exam was performed according to the departmental dose-optimization program which includes automated exposure control, adjustment of the mA and/or kV according to patient size and/or use of iterative reconstruction technique. CONTRAST:  OMNIPAQUE IOHEXOL 300 MG/ML  SOLN COMPARISON:  11/06/2021 FINDINGS: Lower chest: Moderate right pleural effusion with compressive atelectasis of the right lung base is unchanged. Stable eventration of the posterior left hemidiaphragm. Mild coronary artery calcification. Global cardiac size within normal limits. No pericardial effusion. Hepatobiliary: No focal liver abnormality is seen. No gallstones, gallbladder wall thickening, or biliary dilatation. Pancreas: Unremarkable Spleen: Unremarkable Adrenals/Urinary Tract: Adrenal glands are unremarkable. Kidneys are  normal, without renal calculi, focal lesion, or hydronephrosis. Bladder is unremarkable. Stomach/Bowel: Similar prior examination, there is mild fluid dilation of several loops of distal small bowel with fecalization of intraluminal contents in keeping with stasis with a focal point of transition noted within the right lower quadrant in keeping with changes of a partial small bowel obstruction. The degree of proximal bowel dilation appears improved since prior examination. Trace free fluid within the pelvis is unremarkable. No free intraperitoneal gas. The stomach, small bowel, and large bowel are otherwise unremarkable. Appendix normal. Vascular/Lymphatic: Enhancing, rounded right inguinal lymph node is stable measuring 15 mm in short axis diameter. No additional pathologic abdominal or pelvic adenopathy. Moderate aortoiliac atherosclerotic calcification. No aortic aneurysm. Reproductive: Uterus absent.  Pessary in place. Other: Peritoneal nodularity and nodular mesenteric implants are again identified scattered throughout the abdomen but best appreciated within the left lower quadrant. Additionally, soft tissue mass within the anterior peritoneum within the right lower quadrant, axial image # 53/2 all appear unchanged in keeping with changes of metastatic ovarian cancer. No new intra-abdominal masses are identified. Musculoskeletal: The osseous structures are diffusely osteopenic. Superior endplate fractures of L3 and L4 are stable. New superior endplate fracture of T12 and L2 have developed, subacute in etiology with mild loss of height. No retropulsion. IMPRESSION: 1. Partial small bowel obstruction with point of transition within the right  lower quadrant of the abdomen. The degree of proximal bowel dilation appears improved since prior examination. 2. Stable peritoneal nodularity and mesenteric implants in keeping with changes of metastatic ovarian cancer. Stable right inguinal adenopathy. 3. Stable moderate  right pleural effusion. 4. Interval development of subacute superior endplate fractures of T12 and L2 with mild loss of height. No retropulsion. Stable superior endplate fractures of L3 and L4. Electronically Signed   By: Helyn Numbers M.D.   On: 12/20/2021 01:09    Scheduled Meds:  [START ON 12/26/2021] levothyroxine  56 mcg Intravenous Daily    Continuous Infusions:  sodium chloride 75 mL/hr at 12/20/21 4098     LOS: 0 days     Briant Cedar, MD [facility]  If 7PM-7AM, please contact night-coverage [email or web address] 12/20/2021, 12:01 PM

## 2021-12-21 DIAGNOSIS — D649 Anemia, unspecified: Secondary | ICD-10-CM | POA: Diagnosis not present

## 2021-12-21 DIAGNOSIS — C561 Malignant neoplasm of right ovary: Secondary | ICD-10-CM | POA: Diagnosis not present

## 2021-12-21 DIAGNOSIS — D696 Thrombocytopenia, unspecified: Secondary | ICD-10-CM | POA: Diagnosis not present

## 2021-12-21 DIAGNOSIS — K56600 Partial intestinal obstruction, unspecified as to cause: Secondary | ICD-10-CM | POA: Diagnosis not present

## 2021-12-21 LAB — CBC WITH DIFFERENTIAL/PLATELET
Abs Immature Granulocytes: 0 10*3/uL (ref 0.00–0.07)
Basophils Absolute: 0 10*3/uL (ref 0.0–0.1)
Basophils Relative: 0 %
Eosinophils Absolute: 0 10*3/uL (ref 0.0–0.5)
Eosinophils Relative: 0 %
HCT: 28.1 % — ABNORMAL LOW (ref 36.0–46.0)
Hemoglobin: 9.1 g/dL — ABNORMAL LOW (ref 12.0–15.0)
Immature Granulocytes: 0 %
Lymphocytes Relative: 45 %
Lymphs Abs: 1.1 10*3/uL (ref 0.7–4.0)
MCH: 36.5 pg — ABNORMAL HIGH (ref 26.0–34.0)
MCHC: 32.4 g/dL (ref 30.0–36.0)
MCV: 112.9 fL — ABNORMAL HIGH (ref 80.0–100.0)
Monocytes Absolute: 0.3 10*3/uL (ref 0.1–1.0)
Monocytes Relative: 12 %
Neutro Abs: 1 10*3/uL — ABNORMAL LOW (ref 1.7–7.7)
Neutrophils Relative %: 43 %
Platelets: 114 10*3/uL — ABNORMAL LOW (ref 150–400)
RBC: 2.49 MIL/uL — ABNORMAL LOW (ref 3.87–5.11)
RDW: 15.4 % (ref 11.5–15.5)
WBC: 2.4 10*3/uL — ABNORMAL LOW (ref 4.0–10.5)
nRBC: 0 % (ref 0.0–0.2)

## 2021-12-21 LAB — BASIC METABOLIC PANEL
Anion gap: 9 (ref 5–15)
BUN: 13 mg/dL (ref 8–23)
CO2: 25 mmol/L (ref 22–32)
Calcium: 9.8 mg/dL (ref 8.9–10.3)
Chloride: 105 mmol/L (ref 98–111)
Creatinine, Ser: 1.02 mg/dL — ABNORMAL HIGH (ref 0.44–1.00)
GFR, Estimated: 56 mL/min — ABNORMAL LOW (ref >60)
Glucose, Bld: 103 mg/dL — ABNORMAL HIGH (ref 70–99)
Potassium: 4 mmol/L (ref 3.5–5.1)
Sodium: 139 mmol/L (ref 135–145)

## 2021-12-21 MED ORDER — LIP MEDEX EX OINT
1.0000 | TOPICAL_OINTMENT | CUTANEOUS | Status: DC | PRN
  Filled 2021-12-21: qty 7

## 2021-12-21 MED ORDER — ENOXAPARIN SODIUM 40 MG/0.4ML IJ SOSY
40.0000 mg | PREFILLED_SYRINGE | Freq: Every day | INTRAMUSCULAR | Status: DC
  Administered 2021-12-21 – 2021-12-22 (×2): 40 mg via SUBCUTANEOUS
  Filled 2021-12-21 (×2): qty 0.4

## 2021-12-21 NOTE — TOC Progression Note (Addendum)
Transition of Care [facility]) - Progression Note    Patient Details  Name: Paula Peterson MRN: [id number] Date of Birth: 06/02/1941  Transition of Care [facility]) CM/SW Contact  Aaminah Forrester, Olegario Messier, RN Phone Number: 12/21/2021, 2:50 PM  Clinical Narrative: Checking on HHC agency to staff HHPT-[facility]-await response from all others:[facility].Left vm w/dtr to inform of recc-await call back.    Expected Discharge Plan: Home w Home Health Services Barriers to Discharge: Continued Medical Work up  Expected Discharge Plan and Services Expected Discharge Plan: Home w Home Health Services                                               Social Determinants of Health (SDOH) Interventions    Readmission Risk Interventions     No data to display

## 2021-12-21 NOTE — Progress Notes (Signed)
Paula Peterson   DOB:05/21/1941   XB#:[id number]    ASSESSMENT & PLAN:  Right ovarian epithelial cancer (HCC) I have reviewed her CT imaging She had minimal peritoneal disease but were recurrent bowel obstruction almost on a monthly basis now and her small bowel, suspicious for adhesions General surgery is following Continue supportive care I will review her case with GYN surgeon tomorrow to see if she has any other recommendations  Recurrent SBO Abdominal pain and vomiting improved after receiving pain medication and antiemetics We do not feel strongly that NG tube needs to be placed if she is no longer having abdominal pain, nausea, vomiting I do not believe this is due to disease progression She had similar hospitalization earlier this year related to stricture that resolved conservatively   Pancytopenia, acquired (HCC) She has pancytopenia due to recent treatment and mild hemodilution There is no contraindication to remain on antiplatelet agents or anticoagulants as long as the platelet is greater than 50,000   PE (pulmonary thromboembolism) (HCC) She had minimal bleeding She will continue anticoagulation therapy indefinitely There is no contraindication to remain on antiplatelet agents or anticoagulants as long as the platelet is greater than 50,000.   CKD (chronic kidney disease), stage III (HCC), stable She has mild intermittent elevated serum creatinine   Discharge planning Unknown, she will likely be here for the next 2 to 3 days I will continue to follow  All questions were answered. The patient knows to call the clinic with any problems, questions or concerns.   The total time spent in the appointment was 30 minutes encounter with patients including review of chart and various tests results, discussions about plan of care and coordination of care plan  Artis Delay, MD 12/21/2021 9:41 AM  Subjective:  I was notified by hospitalist that the patient is readmitted to the  hospital with small bowel obstruction Last week, she contacted my office several times due to lower back pain She started having nausea vomiting and went to the emergency room.  Repeat CT imaging confirm recurrent small bowel obstruction and she is admitted, managed conservatively She felt better with bowel rest, IV fluids and IV antiemetics She denies significant pain today  Objective:  Vitals:   12/21/21 0220 12/21/21 0611  BP: (!) 121/57 136/60  Pulse: 79 74  Resp: 16 18  Temp: 98 F (36.7 C) 98 F (36.7 C)  SpO2: 95% 91%     Intake/Output Summary (Last 24 hours) at 12/21/2021 0941 Last data filed at 12/21/2021 0233 Gross per 24 hour  Intake 471.24 ml  Output 500 ml  Net -28.76 ml    GENERAL:alert, no distress and comfortable NEURO: alert & oriented x 3 with fluent speech, no focal motor/sensory deficits   Labs:  Recent Labs    11/10/21 0309 12/08/21 1038 12/19/21 2315 12/21/21 0600  NA 135 136 141 139  K 3.8 3.9 3.0* 4.0  CL 103 100 111 105  CO2 26 30 21* 25  GLUCOSE 105* 137* 101* 103*  BUN 10 14 15 13   CREATININE 0.86 1.14* 0.81 1.02*  CALCIUM 9.2 10.0 7.8* 9.8  GFRNONAA >60 49* >60 56*  PROT 6.1* 6.6 5.4*  --   ALBUMIN 3.1* 3.6 2.8*  --   AST 24 18 18   --   ALT 22 23 17   --   ALKPHOS 85 119 84  --   BILITOT 0.6 0.3 0.5  --     Studies:  CT L-SPINE NO CHARGE  Result Date:  12/20/2021 CLINICAL DATA:  Nausea/vomiting, Bowel obstruction suspected, Abdominal pain, acute, nonlocalized EXAM: CT LUMBAR SPINE WITHOUT CONTRAST TECHNIQUE: Multidetector CT imaging of the lumbar spine was performed without intravenous contrast administration. Multiplanar CT image reconstructions were also generated. RADIATION DOSE REDUCTION: This exam was performed according to the departmental dose-optimization program which includes automated exposure control, adjustment of the mA and/or kV according to patient size and/or use of iterative reconstruction technique. COMPARISON:   09/23/2021, 11/06/2021 FINDINGS: Segmentation: Variant anatomy with 6 non rib bearing segments of the lumbar spine. Considering the lowest rib-bearing segment as T12 and the first fully incorporated vertebral body within the sacrum as S1, the lowest non rib-bearing segments of the lumbar spine is considered L6 with partial sacralization on the right. Alignment: Mild straightening.  No listhesis. Vertebrae: Osseous structures are diffusely osteopenic. There is interval development of a a mild superior endplate fracture of L1 with minimal loss of height and no retropulsion, superior endplate fracture of L3 with 10-20% loss of height and no retropulsion, and stable superior endplate fractures of L4 and L5 with mild loss of height since immediate prior examination, but new since remote prior examination of 09/23/2021. Again, no retropulsion associated with these fractures. Fractures of L1 and L3 appears subacute in nature with trace paravertebral edema. No focal lytic or blastic bone lesions. Paraspinal and other soft tissues: Right pleural effusion is partially visualized. Trace paravertebral edema at L1 and L3 are noted in keeping with subacute superior endplate fractures. No paravertebral fluid collections are identified. Disc levels: There is intervertebral disc space narrowing and endplate remodeling throughout Thora lumbar spine in keeping with changes of moderate to severe degenerative disc disease, most severe at L3-4 and L5-L6. Broad-based posterior disc osteophyte complex in combination with facet hypertrophy at L4-5 results in moderate to severe central canal stenosis with probable impingement of the crossing L5 nerve roots. Spinal canal is otherwise widely patent. No significant neuroforaminal narrowing. IMPRESSION: 1. Variant anatomy with 6 non rib bearing segments of the lumbar spine. Considering the lowest rib-bearing segment as T12 and the first fully incorporated vertebral body within the sacrum as S1,  the lowest non rib-bearing segments of the lumbar spine is considered L6 with partial sacralization on the right. 2. Interval development of a mild superior endplate fracture of L1 with minimal loss of height and no retropulsion. 3. Interval development of a superior endplate fracture of L3 with 10-20% loss of height and no retropulsion. 4. Stable superior endplate fractures of L4 and L5 with mild loss of height. 5. Multilevel degenerative disc disease and facet hypertrophy resulting in moderate to severe central canal stenosis at L4-5 with probable impingement of the crossing L5 nerve roots. Electronically Signed   By: Helyn Numbers M.D.   On: 12/20/2021 01:23   CT Abdomen Pelvis W Contrast  Result Date: 12/20/2021 CLINICAL DATA:  Nausea/vomiting. Bowel obstruction suspected. Abdominal pain, acute, nonlocalized EXAM: CT ABDOMEN AND PELVIS WITH CONTRAST TECHNIQUE: Multidetector CT imaging of the abdomen and pelvis was performed using the standard protocol following bolus administration of intravenous contrast. RADIATION DOSE REDUCTION: This exam was performed according to the departmental dose-optimization program which includes automated exposure control, adjustment of the mA and/or kV according to patient size and/or use of iterative reconstruction technique. CONTRAST:  OMNIPAQUE IOHEXOL 300 MG/ML  SOLN COMPARISON:  11/06/2021 FINDINGS: Lower chest: Moderate right pleural effusion with compressive atelectasis of the right lung base is unchanged. Stable eventration of the posterior left hemidiaphragm. Mild coronary artery calcification.  Global cardiac size within normal limits. No pericardial effusion. Hepatobiliary: No focal liver abnormality is seen. No gallstones, gallbladder wall thickening, or biliary dilatation. Pancreas: Unremarkable Spleen: Unremarkable Adrenals/Urinary Tract: Adrenal glands are unremarkable. Kidneys are normal, without renal calculi, focal lesion, or hydronephrosis. Bladder is  unremarkable. Stomach/Bowel: Similar prior examination, there is mild fluid dilation of several loops of distal small bowel with fecalization of intraluminal contents in keeping with stasis with a focal point of transition noted within the right lower quadrant in keeping with changes of a partial small bowel obstruction. The degree of proximal bowel dilation appears improved since prior examination. Trace free fluid within the pelvis is unremarkable. No free intraperitoneal gas. The stomach, small bowel, and large bowel are otherwise unremarkable. Appendix normal. Vascular/Lymphatic: Enhancing, rounded right inguinal lymph node is stable measuring 15 mm in short axis diameter. No additional pathologic abdominal or pelvic adenopathy. Moderate aortoiliac atherosclerotic calcification. No aortic aneurysm. Reproductive: Uterus absent.  Pessary in place. Other: Peritoneal nodularity and nodular mesenteric implants are again identified scattered throughout the abdomen but best appreciated within the left lower quadrant. Additionally, soft tissue mass within the anterior peritoneum within the right lower quadrant, axial image # 53/2 all appear unchanged in keeping with changes of metastatic ovarian cancer. No new intra-abdominal masses are identified. Musculoskeletal: The osseous structures are diffusely osteopenic. Superior endplate fractures of L3 and L4 are stable. New superior endplate fracture of T12 and L2 have developed, subacute in etiology with mild loss of height. No retropulsion. IMPRESSION: 1. Partial small bowel obstruction with point of transition within the right lower quadrant of the abdomen. The degree of proximal bowel dilation appears improved since prior examination. 2. Stable peritoneal nodularity and mesenteric implants in keeping with changes of metastatic ovarian cancer. Stable right inguinal adenopathy. 3. Stable moderate right pleural effusion. 4. Interval development of subacute superior endplate  fractures of T12 and L2 with mild loss of height. No retropulsion. Stable superior endplate fractures of L3 and L4. Electronically Signed   By: Helyn Numbers M.D.   On: 12/20/2021 01:09

## 2021-12-22 ENCOUNTER — Inpatient Hospital Stay (HOSPITAL_COMMUNITY): Payer: Medicare Other

## 2021-12-22 DIAGNOSIS — C561 Malignant neoplasm of right ovary: Secondary | ICD-10-CM | POA: Diagnosis not present

## 2021-12-22 DIAGNOSIS — D649 Anemia, unspecified: Secondary | ICD-10-CM | POA: Diagnosis not present

## 2021-12-22 DIAGNOSIS — K56600 Partial intestinal obstruction, unspecified as to cause: Secondary | ICD-10-CM | POA: Diagnosis not present

## 2021-12-22 DIAGNOSIS — D696 Thrombocytopenia, unspecified: Secondary | ICD-10-CM | POA: Diagnosis not present

## 2021-12-22 LAB — URINALYSIS, ROUTINE W REFLEX MICROSCOPIC
Bilirubin Urine: NEGATIVE
Glucose, UA: NEGATIVE mg/dL
Ketones, ur: NEGATIVE mg/dL
Nitrite: NEGATIVE
Protein, ur: 30 mg/dL — AB
Specific Gravity, Urine: 1.014 (ref 1.005–1.030)
WBC, UA: >50 WBC/hpf — ABNORMAL HIGH (ref 0–5)
pH: 5 (ref 5.0–8.0)

## 2021-12-22 LAB — BASIC METABOLIC PANEL
Anion gap: 8 (ref 5–15)
BUN: 10 mg/dL (ref 8–23)
CO2: 26 mmol/L (ref 22–32)
Calcium: 9.4 mg/dL (ref 8.9–10.3)
Chloride: 104 mmol/L (ref 98–111)
Creatinine, Ser: 0.95 mg/dL (ref 0.44–1.00)
GFR, Estimated: >60 mL/min (ref >60)
Glucose, Bld: 100 mg/dL — ABNORMAL HIGH (ref 70–99)
Potassium: 3.8 mmol/L (ref 3.5–5.1)
Sodium: 138 mmol/L (ref 135–145)

## 2021-12-22 LAB — CBC WITH DIFFERENTIAL/PLATELET
Abs Immature Granulocytes: 0.01 10*3/uL (ref 0.00–0.07)
Basophils Absolute: 0 10*3/uL (ref 0.0–0.1)
Basophils Relative: 0 %
Eosinophils Absolute: 0 10*3/uL (ref 0.0–0.5)
Eosinophils Relative: 0 %
HCT: 27.5 % — ABNORMAL LOW (ref 36.0–46.0)
Hemoglobin: 9 g/dL — ABNORMAL LOW (ref 12.0–15.0)
Immature Granulocytes: 0 %
Lymphocytes Relative: 31 %
Lymphs Abs: 1.2 10*3/uL (ref 0.7–4.0)
MCH: 36.6 pg — ABNORMAL HIGH (ref 26.0–34.0)
MCHC: 32.7 g/dL (ref 30.0–36.0)
MCV: 111.8 fL — ABNORMAL HIGH (ref 80.0–100.0)
Monocytes Absolute: 0.4 10*3/uL (ref 0.1–1.0)
Monocytes Relative: 10 %
Neutro Abs: 2.2 10*3/uL (ref 1.7–7.7)
Neutrophils Relative %: 59 %
Platelets: 110 10*3/uL — ABNORMAL LOW (ref 150–400)
RBC: 2.46 MIL/uL — ABNORMAL LOW (ref 3.87–5.11)
RDW: 15.4 % (ref 11.5–15.5)
WBC: 3.7 10*3/uL — ABNORMAL LOW (ref 4.0–10.5)
nRBC: 0 % (ref 0.0–0.2)

## 2021-12-22 LAB — APTT: aPTT: 36 seconds (ref 24–36)

## 2021-12-22 LAB — HEPARIN LEVEL (UNFRACTIONATED): Heparin Unfractionated: 0.64 IU/mL (ref 0.30–0.70)

## 2021-12-22 MED ORDER — MORPHINE SULFATE (PF) 2 MG/ML IV SOLN
2.0000 mg | INTRAVENOUS | Status: DC | PRN
  Administered 2021-12-22 – 2021-12-24 (×6): 2 mg via INTRAVENOUS
  Filled 2021-12-22 (×6): qty 1

## 2021-12-22 MED ORDER — SODIUM CHLORIDE 0.9 % IV SOLN
1.0000 g | Freq: Every day | INTRAVENOUS | Status: DC
  Administered 2021-12-22 – 2021-12-23 (×2): 1 g via INTRAVENOUS
  Filled 2021-12-22 (×3): qty 10

## 2021-12-22 MED ORDER — ACETAMINOPHEN 325 MG PO TABS
650.0000 mg | ORAL_TABLET | Freq: Once | ORAL | Status: AC
Start: 2021-12-22 — End: 2021-12-22
  Administered 2021-12-22: 650 mg via ORAL
  Filled 2021-12-22: qty 2

## 2021-12-22 MED ORDER — HEPARIN (PORCINE) 25000 UT/250ML-% IV SOLN
1300.0000 [IU]/h | INTRAVENOUS | Status: DC
  Administered 2021-12-22: 1300 [IU]/h via INTRAVENOUS
  Filled 2021-12-22 (×2): qty 250

## 2021-12-22 MED ORDER — ACETAMINOPHEN 325 MG PO TABS
650.0000 mg | ORAL_TABLET | Freq: Four times a day (QID) | ORAL | Status: AC | PRN
  Administered 2021-12-22 – 2021-12-23 (×2): 650 mg via ORAL
  Filled 2021-12-22 (×2): qty 2

## 2021-12-22 NOTE — Progress Notes (Signed)
Subjective: CC: Feeling better since admission. Some lower abdominal bloating and discomfort. Still requiring IV pain medications but reports this is mainly for back pain. Nausea after broth yesterday but did okay with jello at night. Has not taken in much this am. No vomiting. Passing flatus. Small bm yesterday. Having dysuria.   Objective: Vital signs in last 24 hours: Temp:  [97.9 F (36.6 C)-98.4 F (36.9 C)] 97.9 F (36.6 C) (06/26 1610) Pulse Rate:  [40-78] 78 (06/26 0613) Resp:  [18-20] 20 (06/26 0613) BP: (114-133)/(53-68) 123/61 (06/26 0613) SpO2:  [93 %-95 %] 94 % (06/26 0613) Last BM Date : 12/21/21  Intake/Output from previous day: 06/25 0701 - 06/26 0700 In: 1642.5 [P.O.:1080; I.V.:562.5] Out: 1000 [Urine:1000] Intake/Output this shift: No intake/output data recorded.  PE: Gen:  Alert, NAD, pleasant Pulm: rate and effort normal Abd: Some mild lower abdominal distension but very soft and mild ttp of the lower abdomen, +BS,  Psych: A&Ox3   Lab Results:  Recent Labs    12/21/21 0600 12/22/21 0556  WBC 2.4* 3.7*  HGB 9.1* 9.0*  HCT 28.1* 27.5*  PLT 114* 110*   BMET Recent Labs    12/21/21 0600 12/22/21 0556  NA 139 138  K 4.0 3.8  CL 105 104  CO2 25 26  GLUCOSE 103* 100*  BUN 13 10  CREATININE 1.02* 0.95  CALCIUM 9.8 9.4   PT/INR No results for input(s): "LABPROT", "INR" in the last 72 hours. CMP     Component Value Date/Time   NA 138 12/22/2021 0556   K 3.8 12/22/2021 0556   CL 104 12/22/2021 0556   CO2 26 12/22/2021 0556   GLUCOSE 100 (H) 12/22/2021 0556   BUN 10 12/22/2021 0556   CREATININE 0.95 12/22/2021 0556   CREATININE 1.14 (H) 12/08/2021 1038   CALCIUM 9.4 12/22/2021 0556   PROT 5.4 (L) 12/19/2021 2315   ALBUMIN 2.8 (L) 12/19/2021 2315   AST 18 12/19/2021 2315   AST 18 12/08/2021 1038   ALT 17 12/19/2021 2315   ALT 23 12/08/2021 1038   ALKPHOS 84 12/19/2021 2315   BILITOT 0.5 12/19/2021 2315   BILITOT 0.3  12/08/2021 1038   GFRNONAA >60 12/22/2021 0556   GFRNONAA 49 (L) 12/08/2021 1038   GFRAA >60 03/25/2020 1144   Lipase     Component Value Date/Time   LIPASE 18 12/19/2021 2315    Studies/Results: DG Abd 1 View  Result Date: 12/22/2021 CLINICAL DATA:  Abdominal pain. Nausea and vomiting. Partial small bowel obstruction. EXAM: ABDOMEN - 1 VIEW COMPARISON:  Nov 07, 2021 FINDINGS: There is a right-sided pleural effusion with underlying atelectasis. Probable small effusion and atelectasis in the left base. Evaluation for free air is limited due to supine imaging but none is seen. No portal venous gas or pneumatosis. The bowel gas pattern is nonobstructive. No other acute abnormalities. IMPRESSION: No evidence of bowel obstruction or other abdominal abnormality. Right greater than left small pleural effusions with underlying atelectasis. Electronically Signed   By: Gerome Sam III M.D.   On: 12/22/2021 08:50    Anti-infectives: Anti-infectives (From admission, onward)    None        Assessment/Plan SBO - CT 6/24 w/ psbo with transition of the RLQ.  - She has a hx of metastatic R ovarian CA s/p Ex lap, R salpingo-oophorectomy, omentectomy radical tumor debulking for ovarian cancer by Dr. Andrey Farmer in 2020. She is followed by Dr. Bertis Ruddy and currently on Chemo  with last dose ~2 weeks ago. Her CT scan also showed stable peritoneal nodularity, mesenteric implants and right inguinal adenopathy. Onc following. They have discussed case with GYN Onc Surgery and plan to switch her tx upon d/c in case her recurrent sbo is related to worsening disease control.  - I do not think she needs emergency surgery  - Her exam is reassuring and she is having bowel function. Xray with non-obstructive bowel gas pattern. Will try FLD today. If she was to regress and develop worsening pain, n/v she would likely need NGT and SBO protocol.  - We will follow with you  FEN - FLD, IVF per [facility] VTE - SCDs, Eliquis on  hold. On Lovenox. Okay for heparin gtt ID - None currently  - Per [facility] -  Hx Met R ovarian CA Dysuria - UA pending Hx PE - Eliquis on hold. Okay for heparin gtt  HTN Hx CVA Hx CKD3 Endplate fx's of thoracic and lumbar spine  I reviewed nursing notes, ED provider notes, Consultant (Onc) notes, last 24 h vitals and pain scores, last 48 h intake and output, last 24 h labs and trends, and last 24 h imaging results.   LOS: 2 days    Jacinto Halim , [facility] 12/22/2021, 8:55 AM Please see Amion for pager number during day hours 7:00am-4:30pm

## 2021-12-22 NOTE — Progress Notes (Signed)
Paula Peterson   DOB:[birth year]   HW#:[id number]    ASSESSMENT & PLAN:   Right ovarian epithelial cancer (HCC) I have reviewed her CT imaging I reviewed her case with GYN surgeon I plan to switch her treatment upon discharge in case her recurrent bowel obstruction is related to worsening disease control General surgery is following Continue supportive care   Recurrent SBO Abdominal pain and vomiting improved after receiving pain medication and antiemetics We do not feel strongly that NG tube needs to be placed if she is no longer having abdominal pain, nausea, vomiting She had similar hospitalization earlier this year related to stricture that resolved conservatively but her current disease could be due to microscopic peritoneal disease I will discuss with the patient about switching treatment when I see her back in the outpatient clinic  Intermittent back pain She does have compression fraction on the back I increased the dose of her pain medicine, we will reassess tomorrow  Dysuria Urinalysis has been ordered   Pancytopenia, acquired (HCC) She has pancytopenia due to recent treatment and mild hemodilution There is no contraindication to remain on antiplatelet agents or anticoagulants as long as the platelet is greater than 50,000   PE (pulmonary thromboembolism) (HCC) She had minimal bleeding She will continue anticoagulation therapy indefinitely There is no contraindication to remain on antiplatelet agents or anticoagulants as long as the platelet is greater than 50,000.   CKD (chronic kidney disease), stage III (HCC), stable She has mild intermittent elevated serum creatinine   Discharge planning She is not ready to be discharged I will order repeat x-ray and I will see her tomorrow  All questions were answered. The patient knows to call the clinic with any problems, questions or concerns.   The total time spent in the appointment was 30 minutes encounter with patients  including review of chart and various tests results, discussions about plan of care and coordination of care plan  Artis Delay, MD 12/22/2021 8:20 AM  Subjective:  She felt miserable.  She has frequent urination and dysuria.  No recent bleeding.  She continues to have intermittent crampy abdominal pain and back pain  Objective:  Vitals:   12/21/21 2223 12/22/21 0613  BP: 114/68 123/61  Pulse: (!) 40 78  Resp: 18 20  Temp: 98.4 F (36.9 C) 97.9 F (36.6 C)  SpO2: 93% 94%     Intake/Output Summary (Last 24 hours) at 12/22/2021 0820 Last data filed at 12/22/2021 0228 Gross per 24 hour  Intake 1642.5 ml  Output 1000 ml  Net 642.5 ml    GENERAL:alert, no distress and comfortable  NEURO: alert & oriented x 3 with fluent speech, no focal motor/sensory deficits   Labs:  Recent Labs    11/10/21 0309 12/08/21 1038 12/19/21 2315 12/21/21 0600 12/22/21 0556  NA 135 136 141 139 138  K 3.8 3.9 3.0* 4.0 3.8  CL 103 100 111 105 104  CO2 26 30 21* 25 26  GLUCOSE 105* 137* 101* 103* 100*  BUN 10 14 15 13 10   CREATININE 0.86 1.14* 0.81 1.02* 0.95  CALCIUM 9.2 10.0 7.8* 9.8 9.4  GFRNONAA >60 49* >60 56* >60  PROT 6.1* 6.6 5.4*  --   --   ALBUMIN 3.1* 3.6 2.8*  --   --   AST 24 18 18   --   --   ALT 22 23 17   --   --   ALKPHOS 85 119 84  --   --  BILITOT 0.6 0.3 0.5  --   --     Studies:  CT L-SPINE NO CHARGE  Result Date: 12/20/2021 CLINICAL DATA:  Nausea/vomiting, Bowel obstruction suspected, Abdominal pain, acute, nonlocalized EXAM: CT LUMBAR SPINE WITHOUT CONTRAST TECHNIQUE: Multidetector CT imaging of the lumbar spine was performed without intravenous contrast administration. Multiplanar CT image reconstructions were also generated. RADIATION DOSE REDUCTION: This exam was performed according to the departmental dose-optimization program which includes automated exposure control, adjustment of the mA and/or kV according to patient size and/or use of iterative reconstruction  technique. COMPARISON:  09/23/2021, 11/06/2021 FINDINGS: Segmentation: Variant anatomy with 6 non rib bearing segments of the lumbar spine. Considering the lowest rib-bearing segment as T12 and the first fully incorporated vertebral body within the sacrum as S1, the lowest non rib-bearing segments of the lumbar spine is considered L6 with partial sacralization on the right. Alignment: Mild straightening.  No listhesis. Vertebrae: Osseous structures are diffusely osteopenic. There is interval development of a a mild superior endplate fracture of L1 with minimal loss of height and no retropulsion, superior endplate fracture of L3 with 10-20% loss of height and no retropulsion, and stable superior endplate fractures of L4 and L5 with mild loss of height since immediate prior examination, but new since remote prior examination of 09/23/2021. Again, no retropulsion associated with these fractures. Fractures of L1 and L3 appears subacute in nature with trace paravertebral edema. No focal lytic or blastic bone lesions. Paraspinal and other soft tissues: Right pleural effusion is partially visualized. Trace paravertebral edema at L1 and L3 are noted in keeping with subacute superior endplate fractures. No paravertebral fluid collections are identified. Disc levels: There is intervertebral disc space narrowing and endplate remodeling throughout Thora lumbar spine in keeping with changes of moderate to severe degenerative disc disease, most severe at L3-4 and L5-L6. Broad-based posterior disc osteophyte complex in combination with facet hypertrophy at L4-5 results in moderate to severe central canal stenosis with probable impingement of the crossing L5 nerve roots. Spinal canal is otherwise widely patent. No significant neuroforaminal narrowing. IMPRESSION: 1. Variant anatomy with 6 non rib bearing segments of the lumbar spine. Considering the lowest rib-bearing segment as T12 and the first fully incorporated vertebral body  within the sacrum as S1, the lowest non rib-bearing segments of the lumbar spine is considered L6 with partial sacralization on the right. 2. Interval development of a mild superior endplate fracture of L1 with minimal loss of height and no retropulsion. 3. Interval development of a superior endplate fracture of L3 with 10-20% loss of height and no retropulsion. 4. Stable superior endplate fractures of L4 and L5 with mild loss of height. 5. Multilevel degenerative disc disease and facet hypertrophy resulting in moderate to severe central canal stenosis at L4-5 with probable impingement of the crossing L5 nerve roots. Electronically Signed   By: Helyn Numbers M.D.   On: 12/20/2021 01:23   CT Abdomen Pelvis W Contrast  Result Date: 12/20/2021 CLINICAL DATA:  Nausea/vomiting. Bowel obstruction suspected. Abdominal pain, acute, nonlocalized EXAM: CT ABDOMEN AND PELVIS WITH CONTRAST TECHNIQUE: Multidetector CT imaging of the abdomen and pelvis was performed using the standard protocol following bolus administration of intravenous contrast. RADIATION DOSE REDUCTION: This exam was performed according to the departmental dose-optimization program which includes automated exposure control, adjustment of the mA and/or kV according to patient size and/or use of iterative reconstruction technique. CONTRAST:  OMNIPAQUE IOHEXOL 300 MG/ML  SOLN COMPARISON:  11/06/2021 FINDINGS: Lower chest: Moderate right pleural  effusion with compressive atelectasis of the right lung base is unchanged. Stable eventration of the posterior left hemidiaphragm. Mild coronary artery calcification. Global cardiac size within normal limits. No pericardial effusion. Hepatobiliary: No focal liver abnormality is seen. No gallstones, gallbladder wall thickening, or biliary dilatation. Pancreas: Unremarkable Spleen: Unremarkable Adrenals/Urinary Tract: Adrenal glands are unremarkable. Kidneys are normal, without renal calculi, focal lesion, or  hydronephrosis. Bladder is unremarkable. Stomach/Bowel: Similar prior examination, there is mild fluid dilation of several loops of distal small bowel with fecalization of intraluminal contents in keeping with stasis with a focal point of transition noted within the right lower quadrant in keeping with changes of a partial small bowel obstruction. The degree of proximal bowel dilation appears improved since prior examination. Trace free fluid within the pelvis is unremarkable. No free intraperitoneal gas. The stomach, small bowel, and large bowel are otherwise unremarkable. Appendix normal. Vascular/Lymphatic: Enhancing, rounded right inguinal lymph node is stable measuring 15 mm in short axis diameter. No additional pathologic abdominal or pelvic adenopathy. Moderate aortoiliac atherosclerotic calcification. No aortic aneurysm. Reproductive: Uterus absent.  Pessary in place. Other: Peritoneal nodularity and nodular mesenteric implants are again identified scattered throughout the abdomen but best appreciated within the left lower quadrant. Additionally, soft tissue mass within the anterior peritoneum within the right lower quadrant, axial image # 53/2 all appear unchanged in keeping with changes of metastatic ovarian cancer. No new intra-abdominal masses are identified. Musculoskeletal: The osseous structures are diffusely osteopenic. Superior endplate fractures of L3 and L4 are stable. New superior endplate fracture of T12 and L2 have developed, subacute in etiology with mild loss of height. No retropulsion. IMPRESSION: 1. Partial small bowel obstruction with point of transition within the right lower quadrant of the abdomen. The degree of proximal bowel dilation appears improved since prior examination. 2. Stable peritoneal nodularity and mesenteric implants in keeping with changes of metastatic ovarian cancer. Stable right inguinal adenopathy. 3. Stable moderate right pleural effusion. 4. Interval development of  subacute superior endplate fractures of T12 and L2 with mild loss of height. No retropulsion. Stable superior endplate fractures of L3 and L4. Electronically Signed   By: Helyn Numbers M.D.   On: 12/20/2021 01:09

## 2021-12-22 NOTE — Consult Note (Signed)
Reason for Consult: Vertebral compression fractures  Referring Physician: Andreas Newport, MD  HPI: Paula Peterson is a 81 y.o. 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 In the ED, vital signs and labs fairly stable. CT abdomen pelvis showing partial SBO with transition point in the right lower quadrant of the abdomen.  CT also demonstrated subacute superior endplate fractures of T12 and L2 with mild loss of height and no retropulsion.  Stable superior endplate fractures of L3 and L4. [facility] was consulted as patient is an established patient of Dr. Ethelene Hal.  Patient reports that she has had acute on chronic back pain since last Wednesday. No known injury or trauma. Hx of osteoporosis. Reports that she went to get out of bed and had excruciating pain along the lower back. Has had difficulty ambulating. Currently ambulates with a walker. Denies numbness or tingling. Denies loss of bowel or bladder function although does note burning with urination. She has been taking percocet as needed for pain with little relief. Additionally, she notes that she had an injection into the back about six weeks ago with little relief. She was supposed to have follow-up with Dr. Ethelene Hal today but had to cancel due to hospital admission.  Past Medical History:  Diagnosis Date   Back pain    DVT (deep venous thrombosis) (HCC) 02/02/2019   had DVT first that moved into lung   GERD (gastroesophageal reflux disease)    History of pulmonary embolus (PE) 02/02/2019   confirmed by CT chest- had DVT first that moved into lungs per patient   Hypertension    Ovarian cancer (HCC)    Pessary maintenance    Stroke [facility])     Past Surgical History:  Procedure Laterality Date   ABDOMINAL HYSTERECTOMY      ovaries left   BACK SURGERY     DEBULKING Bilateral 06/06/2019   Procedure: RADICAL TUMOR DEBULKING;  Surgeon: Adolphus Birchwood, MD;  Location: [facility];  Service: Gynecology;  Laterality: Bilateral;   FRACTURE SURGERY     IR IMAGING GUIDED PORT INSERTION  12/15/2018   OMENTECTOMY Bilateral 06/06/2019   Procedure: OMENTECTOMY;  Surgeon: Adolphus Birchwood, MD;  Location: [facility];  Service: Gynecology;  Laterality: Bilateral;   SALPINGOOPHORECTOMY Bilateral 06/06/2019   Procedure: EXPLORATORY LAPAROTOMY,OPEN RIGHT SALPINGO OOPHORECTOMY;  Surgeon: Adolphus Birchwood, MD;  Location: [facility];  Service: Gynecology;  Laterality: Bilateral;    Family History  Problem Relation Age of Onset   Stomach cancer Sister        dx 14s-50s   Dementia Mother    Cancer Sister        unk type, hysterectomy, dx 73s    Social History:  reports that she has never smoked. She has never used smokeless tobacco. She reports that she does not drink alcohol and does not use drugs.  Allergies: No Known Allergies  Medications: I have reviewed the patient's current medications.  Results for orders placed or performed during the hospital encounter of 12/19/21 (from the past 48 hour(s))  Basic metabolic panel     Status: Abnormal   Collection Time: 12/21/21  6:00 AM  Result Value Ref Range   Sodium 139 135 - 145 mmol/L   Potassium 4.0 3.5 - 5.1 mmol/L    Comment: DELTA CHECK NOTED   Chloride 105 98 -  111 mmol/L   CO2 25 22 - 32 mmol/L   Glucose, Bld 103 (H) 70 - 99 mg/dL    Comment: Glucose reference range applies only to samples taken after fasting for at least 8 hours.   BUN 13 8 - 23 mg/dL   Creatinine, Ser 1.61 (H) 0.44 - 1.00 mg/dL   Calcium 9.8 8.9 - 09.6 mg/dL   GFR, Estimated 56 (L) >60 mL/min    Comment: (NOTE) Calculated using the CKD-EPI Creatinine Equation (2021)    Anion gap 9 5 - 15    Comment: Performed at [facility], [address], [place], [place] [address]  CBC with Differential/Platelet      Status: Abnormal   Collection Time: 12/21/21  6:00 AM  Result Value Ref Range   WBC 2.4 (L) 4.0 - 10.5 K/uL   RBC 2.49 (L) 3.87 - 5.11 MIL/uL   Hemoglobin 9.1 (L) 12.0 - 15.0 g/dL   HCT 98.1 (L) 19.1 - 47.8 %   MCV 112.9 (H) 80.0 - 100.0 fL    Comment: REPEATED TO VERIFY   MCH 36.5 (H) 26.0 - 34.0 pg   MCHC 32.4 30.0 - 36.0 g/dL   RDW 29.5 62.1 - 30.8 %   Platelets 114 (L) 150 - 400 K/uL    Comment: SPECIMEN CHECKED FOR CLOTS Immature Platelet Fraction may be clinically indicated, consider ordering this additional test [id number] REPEATED TO VERIFY    nRBC 0.0 0.0 - 0.2 %   Neutrophils Relative % 43 %   Neutro Abs 1.0 (L) 1.7 - 7.7 K/uL   Lymphocytes Relative 45 %   Lymphs Abs 1.1 0.7 - 4.0 K/uL   Monocytes Relative 12 %   Monocytes Absolute 0.3 0.1 - 1.0 K/uL   Eosinophils Relative 0 %   Eosinophils Absolute 0.0 0.0 - 0.5 K/uL   Basophils Relative 0 %   Basophils Absolute 0.0 0.0 - 0.1 K/uL   Immature Granulocytes 0 %   Abs Immature Granulocytes 0.00 0.00 - 0.07 K/uL    Comment: Performed at [facility], [address], [place], [place] [address]  Basic metabolic panel     Status: Abnormal   Collection Time: 12/22/21  5:56 AM  Result Value Ref Range   Sodium 138 135 - 145 mmol/L   Potassium 3.8 3.5 - 5.1 mmol/L   Chloride 104 98 - 111 mmol/L   CO2 26 22 - 32 mmol/L   Glucose, Bld 100 (H) 70 - 99 mg/dL    Comment: Glucose reference range applies only to samples taken after fasting for at least 8 hours.   BUN 10 8 - 23 mg/dL   Creatinine, Ser 4.13 0.44 - 1.00 mg/dL   Calcium 9.4 8.9 - 24.4 mg/dL   GFR, Estimated >01 >02 mL/min    Comment: (NOTE) Calculated using the CKD-EPI Creatinine Equation (2021)    Anion gap 8 5 - 15    Comment: Performed at [facility], [address], [place], [place] [address]  CBC with Differential/Platelet     Status: Abnormal   Collection Time: 12/22/21  5:56 AM  Result Value Ref Range   WBC 3.7  (L) 4.0 - 10.5 K/uL   RBC 2.46 (L) 3.87 - 5.11 MIL/uL   Hemoglobin 9.0 (L) 12.0 - 15.0 g/dL   HCT 64.4 (L) 03.4 - 74.2 %   MCV 111.8 (H) 80.0 - 100.0 fL   MCH 36.6 (H) 26.0 - 34.0 pg   MCHC 32.7  30.0 - 36.0 g/dL   RDW 40.3 47.4 - 25.9 %   Platelets 110 (L) 150 - 400 K/uL    Comment: Immature Platelet Fraction may be clinically indicated, consider ordering this additional test [id number] CONSISTENT WITH PREVIOUS RESULT    nRBC 0.0 0.0 - 0.2 %   Neutrophils Relative % 59 %   Neutro Abs 2.2 1.7 - 7.7 K/uL   Lymphocytes Relative 31 %   Lymphs Abs 1.2 0.7 - 4.0 K/uL   Monocytes Relative 10 %   Monocytes Absolute 0.4 0.1 - 1.0 K/uL   Eosinophils Relative 0 %   Eosinophils Absolute 0.0 0.0 - 0.5 K/uL   Basophils Relative 0 %   Basophils Absolute 0.0 0.0 - 0.1 K/uL   Immature Granulocytes 0 %   Abs Immature Granulocytes 0.01 0.00 - 0.07 K/uL    Comment: Performed at [facility], [address], [place], [place] [address]  Urinalysis, Routine w reflex microscopic     Status: Abnormal   Collection Time: 12/22/21  7:54 AM  Result Value Ref Range   Color, Urine YELLOW YELLOW   APPearance CLOUDY (A) CLEAR   Specific Gravity, Urine 1.014 1.005 - 1.030   pH 5.0 5.0 - 8.0   Glucose, UA NEGATIVE NEGATIVE mg/dL   Hgb urine dipstick LARGE (A) NEGATIVE   Bilirubin Urine NEGATIVE NEGATIVE   Ketones, ur NEGATIVE NEGATIVE mg/dL   Protein, ur 30 (A) NEGATIVE mg/dL   Nitrite NEGATIVE NEGATIVE   Leukocytes,Ua LARGE (A) NEGATIVE   RBC / HPF 11-20 0 - 5 RBC/hpf   WBC, UA >50 (H) 0 - 5 WBC/hpf   Bacteria, UA RARE (A) NONE SEEN   Squamous Epithelial / LPF 0-5 0 - 5   Mucus PRESENT     Comment: Performed at [facility], [address], [place], [place] [address]  Heparin level (unfractionated)     Status: None   Collection Time: 12/22/21  1:57 PM  Result Value Ref Range   Heparin Unfractionated 0.64 0.30 - 0.70 IU/mL    Comment: (NOTE) The clinical  reportable range upper limit is being lowered to >1.10 to align with the FDA approved guidance for the current laboratory assay.  If heparin results are below expected values, and patient dosage has  been confirmed, suggest follow up testing of antithrombin III levels. Performed at [facility], [address], [place], [place] [address]   APTT     Status: None   Collection Time: 12/22/21  1:58 PM  Result Value Ref Range   aPTT 36 24 - 36 seconds    Comment: Performed at [facility], [address] [place], [place] [address]    DG Abd 1 View  Result Date: 12/22/2021 CLINICAL DATA:  Abdominal pain. Nausea and vomiting. Partial small bowel obstruction. EXAM: ABDOMEN - 1 VIEW COMPARISON:  Nov 07, 2021 FINDINGS: There is a right-sided pleural effusion with underlying atelectasis. Probable small effusion and atelectasis in the left base. Evaluation for free air is limited due to supine imaging but none is seen. No portal venous gas or pneumatosis. The bowel gas pattern is nonobstructive. No other acute abnormalities. IMPRESSION: No evidence of bowel obstruction or other abdominal abnormality. Right greater than left small pleural effusions with underlying atelectasis. Electronically Signed   By: Gerome Sam III M.D.   On: 12/22/2021 08:50    Review of Systems  Respiratory:  Negative for chest tightness and shortness of breath.  Cardiovascular:  Negative for chest pain and palpitations.  Genitourinary:  Positive for dysuria. Negative for hematuria.  Musculoskeletal:  Positive for back pain, gait problem and myalgias.  Neurological:  Negative for numbness.   Blood pressure 130/61, pulse 90, temperature 97.9 F (36.6 C), temperature source Oral, resp. rate 16, height 5\' 7"  (1.702 m), weight 98.5 kg, SpO2 93 %. Physical Exam Constitutional:      General: She is not in acute distress. HENT:     Head: Normocephalic.  Eyes:     Extraocular  Movements: Extraocular movements intact.     Pupils: Pupils are equal, round, and reactive to light.  Pulmonary:     Effort: Pulmonary effort is normal.  Musculoskeletal:     Lumbar back: Spasms, tenderness and bony tenderness present. No swelling or deformity. Decreased range of motion.     Comments: Nuerovascular intact. Sensation intact distally. Dorsiflexion/plantar flexion intact. Motor function intact - moving foot and toes well on exam.  Skin:    Capillary Refill: Capillary refill takes less than 2 seconds.  Neurological:     General: No focal deficit present.     Mental Status: She is alert.     Assessment/Plan: Assessment: Vertebral compression fractures of T12-L3  Plan: Reviewed CT with Dr. Lequita Halt. Compression fractures are stable.  Continue with pain management. PT/OT as able.  Daughter planning to call and reschedule appt with Dr. Ethelene Hal as appt today was canceled due to hospital admission.  Arcola Jansky 12/22/2021, 4:33 PM

## 2021-12-23 DIAGNOSIS — K56600 Partial intestinal obstruction, unspecified as to cause: Secondary | ICD-10-CM | POA: Diagnosis not present

## 2021-12-23 DIAGNOSIS — D649 Anemia, unspecified: Secondary | ICD-10-CM | POA: Diagnosis not present

## 2021-12-23 DIAGNOSIS — C561 Malignant neoplasm of right ovary: Secondary | ICD-10-CM | POA: Diagnosis not present

## 2021-12-23 DIAGNOSIS — D696 Thrombocytopenia, unspecified: Secondary | ICD-10-CM | POA: Diagnosis not present

## 2021-12-23 LAB — BASIC METABOLIC PANEL
Anion gap: 8 (ref 5–15)
BUN: 7 mg/dL — ABNORMAL LOW (ref 8–23)
CO2: 26 mmol/L (ref 22–32)
Calcium: 9.3 mg/dL (ref 8.9–10.3)
Chloride: 105 mmol/L (ref 98–111)
Creatinine, Ser: 0.93 mg/dL (ref 0.44–1.00)
GFR, Estimated: >60 mL/min (ref >60)
Glucose, Bld: 102 mg/dL — ABNORMAL HIGH (ref 70–99)
Potassium: 4 mmol/L (ref 3.5–5.1)
Sodium: 139 mmol/L (ref 135–145)

## 2021-12-23 LAB — CBC WITH DIFFERENTIAL/PLATELET
Abs Immature Granulocytes: 0 10*3/uL (ref 0.00–0.07)
Basophils Absolute: 0 10*3/uL (ref 0.0–0.1)
Basophils Relative: 1 %
Eosinophils Absolute: 0 10*3/uL (ref 0.0–0.5)
Eosinophils Relative: 0 %
HCT: 26 % — ABNORMAL LOW (ref 36.0–46.0)
Hemoglobin: 8.6 g/dL — ABNORMAL LOW (ref 12.0–15.0)
Immature Granulocytes: 0 %
Lymphocytes Relative: 35 %
Lymphs Abs: 1.1 10*3/uL (ref 0.7–4.0)
MCH: 37.2 pg — ABNORMAL HIGH (ref 26.0–34.0)
MCHC: 33.1 g/dL (ref 30.0–36.0)
MCV: 112.6 fL — ABNORMAL HIGH (ref 80.0–100.0)
Monocytes Absolute: 0.4 10*3/uL (ref 0.1–1.0)
Monocytes Relative: 11 %
Neutro Abs: 1.7 10*3/uL (ref 1.7–7.7)
Neutrophils Relative %: 53 %
Platelets: 103 10*3/uL — ABNORMAL LOW (ref 150–400)
RBC: 2.31 MIL/uL — ABNORMAL LOW (ref 3.87–5.11)
RDW: 15.4 % (ref 11.5–15.5)
WBC: 3.2 10*3/uL — ABNORMAL LOW (ref 4.0–10.5)
nRBC: 0 % (ref 0.0–0.2)

## 2021-12-23 LAB — APTT
aPTT: 113 seconds — ABNORMAL HIGH (ref 24–36)
aPTT: 103 seconds — ABNORMAL HIGH (ref 24–36)

## 2021-12-23 MED ORDER — HEPARIN (PORCINE) 25000 UT/250ML-% IV SOLN
1150.0000 [IU]/h | INTRAVENOUS | Status: DC
Start: 2021-12-23 — End: 2021-12-23
  Administered 2021-12-23: 1150 [IU]/h via INTRAVENOUS
  Filled 2021-12-23 (×2): qty 250

## 2021-12-23 MED ORDER — FENTANYL 12 MCG/HR TD PT72
1.0000 | MEDICATED_PATCH | TRANSDERMAL | Status: DC
  Administered 2021-12-23: 1 via TRANSDERMAL
  Filled 2021-12-23: qty 1

## 2021-12-23 MED ORDER — OXYCODONE HCL 5 MG PO TABS: 5.0000 mg | ORAL_TABLET | Freq: Four times a day (QID) | ORAL | Status: DC | PRN

## 2021-12-23 MED ORDER — ACETAMINOPHEN 325 MG PO TABS
650.0000 mg | ORAL_TABLET | Freq: Four times a day (QID) | ORAL | Status: DC | PRN
  Administered 2021-12-23 – 2021-12-24 (×3): 650 mg via ORAL
  Filled 2021-12-23 (×3): qty 2

## 2021-12-23 MED ORDER — OXYCODONE HCL 5 MG PO TABS
5.0000 mg | ORAL_TABLET | Freq: Four times a day (QID) | ORAL | Status: DC | PRN
  Administered 2021-12-23: 5 mg via ORAL
  Filled 2021-12-23: qty 1

## 2021-12-23 MED ORDER — APIXABAN 5 MG PO TABS
5.0000 mg | ORAL_TABLET | Freq: Two times a day (BID) | ORAL | Status: DC
  Administered 2021-12-23 – 2021-12-24 (×3): 5 mg via ORAL
  Filled 2021-12-23 (×3): qty 1

## 2021-12-24 DIAGNOSIS — K56600 Partial intestinal obstruction, unspecified as to cause: Secondary | ICD-10-CM | POA: Diagnosis not present

## 2021-12-24 LAB — CBC WITH DIFFERENTIAL/PLATELET
Abs Immature Granulocytes: 0.01 10*3/uL (ref 0.00–0.07)
Basophils Absolute: 0 10*3/uL (ref 0.0–0.1)
Basophils Relative: 1 %
Eosinophils Absolute: 0 10*3/uL (ref 0.0–0.5)
Eosinophils Relative: 1 %
HCT: 25.3 % — ABNORMAL LOW (ref 36.0–46.0)
Hemoglobin: 8.5 g/dL — ABNORMAL LOW (ref 12.0–15.0)
Immature Granulocytes: 1 %
Lymphocytes Relative: 46 %
Lymphs Abs: 1 10*3/uL (ref 0.7–4.0)
MCH: 37.4 pg — ABNORMAL HIGH (ref 26.0–34.0)
MCHC: 33.6 g/dL (ref 30.0–36.0)
MCV: 111.5 fL — ABNORMAL HIGH (ref 80.0–100.0)
Monocytes Absolute: 0.3 10*3/uL (ref 0.1–1.0)
Monocytes Relative: 15 %
Neutro Abs: 0.8 10*3/uL — ABNORMAL LOW (ref 1.7–7.7)
Neutrophils Relative %: 36 %
Platelets: 106 10*3/uL — ABNORMAL LOW (ref 150–400)
RBC: 2.27 MIL/uL — ABNORMAL LOW (ref 3.87–5.11)
RDW: 15.5 % (ref 11.5–15.5)
WBC: 2.1 10*3/uL — ABNORMAL LOW (ref 4.0–10.5)
nRBC: 0 % (ref 0.0–0.2)

## 2021-12-24 LAB — BASIC METABOLIC PANEL
Anion gap: 9 (ref 5–15)
BUN: 9 mg/dL (ref 8–23)
CO2: 26 mmol/L (ref 22–32)
Calcium: 9.8 mg/dL (ref 8.9–10.3)
Chloride: 105 mmol/L (ref 98–111)
Creatinine, Ser: 0.93 mg/dL (ref 0.44–1.00)
GFR, Estimated: >60 mL/min (ref >60)
Glucose, Bld: 99 mg/dL (ref 70–99)
Potassium: 3.9 mmol/L (ref 3.5–5.1)
Sodium: 140 mmol/L (ref 135–145)

## 2021-12-24 LAB — URINE CULTURE: Culture: >=100000 — AB

## 2021-12-24 MED ORDER — CEPHALEXIN 500 MG PO CAPS: 500.0000 mg | ORAL_CAPSULE | Freq: Two times a day (BID) | ORAL | 0 refills | Status: AC

## 2021-12-24 MED ORDER — FENTANYL 12 MCG/HR TD PT72: 1.0000 | MEDICATED_PATCH | TRANSDERMAL | 0 refills | Status: DC

## 2021-12-24 MED ORDER — BISACODYL 10 MG RE SUPP
10.0000 mg | Freq: Once | RECTAL | Status: AC
  Administered 2021-12-24: 10 mg via RECTAL
  Filled 2021-12-24: qty 1

## 2021-12-24 MED ORDER — POLYETHYLENE GLYCOL 3350 17 G PO PACK: 17.0000 g | PACK | Freq: Every day | ORAL | 0 refills | Status: DC

## 2021-12-24 MED ORDER — DOCUSATE SODIUM 100 MG PO CAPS: 100.0000 mg | ORAL_CAPSULE | Freq: Two times a day (BID) | ORAL | 0 refills | Status: DC

## 2021-12-24 MED ORDER — HEPARIN SOD (PORK) LOCK FLUSH 100 UNIT/ML IV SOLN
500.0000 [IU] | Freq: Once | INTRAVENOUS | Status: AC
  Administered 2021-12-24: 500 [IU] via INTRAVENOUS
  Filled 2021-12-24: qty 5

## 2021-12-24 MED ORDER — DOCUSATE SODIUM 100 MG PO CAPS
100.0000 mg | ORAL_CAPSULE | Freq: Two times a day (BID) | ORAL | Status: DC
Start: 2021-12-24 — End: 2021-12-24
  Administered 2021-12-24: 100 mg via ORAL
  Filled 2021-12-24: qty 1

## 2021-12-24 MED ORDER — POLYETHYLENE GLYCOL 3350 17 G PO PACK
17.0000 g | PACK | Freq: Every day | ORAL | Status: DC
  Administered 2021-12-24: 17 g via ORAL
  Filled 2021-12-24: qty 1

## 2021-12-24 NOTE — Discharge Summary (Signed)
Physician Discharge Summary   Patient: Paula Peterson MRN: [id number] DOB: 01/13/1941  Admit date:     12/19/2021  Discharge date: 12/24/21  Discharge Physician: Elmarie Shiley   PCP: Paula Bis, MD   Recommendations at discharge:   Follow up with Oncology   Discharge Diagnoses: Principal Problem:   Partial small bowel obstruction (HCC) Active Problems:   Right ovarian epithelial cancer (HCC)   PE (pulmonary thromboembolism) (HCC)   Hypokalemia   Chronic anemia   Thrombocytopenia (HCC)  Resolved Problems:   * No resolved hospital problems. *  Hospital Course: Paula Peterson is a 81 y.o. 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 In the ED, vital signs and labs fairly stable. CT abdomen pelvis showing partial SBO with transition point in the right lower quadrant of the abdomen.  Subacute superior endplate fractures of I45 and L2 with mild loss of height and no retropulsion.  Stable superior endplate fractures of L3 and L4.  General surgery consulted.  Patient admitted for further management.  Patient was treated with conservative management. She was able to tolerates diet. Surgery recommend discharge.  In regards leukopenia, Dr Sherren Kerns didn't have any new recommendations.   Assessment and Plan:   Partial SBO CT abdomen showed partial small bowel obstruction General surgery on board, conservative management, no need for surgery now Likely SBO related to ovarian cancer, oncology planning on switching treatments as an outpatient Tolerated diet. Had some nausea.  Ok to discharge today per surgery.    Subacute vertebral compression fractures CT showed interval development of subacute superior endplate fractures of Y09 and L2 with mild loss of height [facility]  consulted, follows with Dr. Nelva Bush.  Dr. Wynelle Link reviewed CT, noted fractures are stable recommended pain management, PT/OT as able Pain management-started on fentanyl patch, continue oxycodone PT/OT Will provide fentanyl patch.   UTI Complaints of dysuria Just recently completed antibiotics for UTI UA showed large hemoglobin, large leukocytes, rare bacteria, greater than 50 WBC UC growing greater than 100,000 E. Coli Received 2 days of IV ceftriaxone/.  Discharge on keflex for 2 days.   Hypokalemia Replace as needed   Right ovarian epithelial cancer On chemotherapy; followed by Dr. Alvy Bimler, appreciate recs   History of pancytopenia Due to malignancy/chemotherapy Daily CBC   History of PE Restarted Eliquis   Hypothyroidism Continue Synthroid   Obesity Lifestyle modification advised          Consultants: Surgery  Procedures performed:None Disposition: Home Diet recommendation:  Discharge Diet Orders (From admission, onward)     Start     Ordered   12/24/21 0000  Diet - low sodium heart healthy        12/24/21 1643           Soft diet  DISCHARGE MEDICATION: Allergies as of 12/24/2021   No Known Allergies      Medication List     STOP taking these medications    traMADol 50 MG tablet Commonly known as: ULTRAM       TAKE these medications    acetaminophen 500 MG tablet Commonly known as: TYLENOL Take 1,000 mg by mouth every 6 (six) hours as needed for mild pain or fever.   anti-nausea solution Take 10 mLs by mouth every 15 (fifteen) minutes as needed for nausea or  vomiting.   apixaban 5 MG Tabs tablet Commonly known as: Eliquis Take 1 tablet (5 mg total) by mouth 2 (two) times daily.   atenolol 25 MG tablet Commonly known as: TENORMIN Take 1 tablet (25 mg total) by mouth daily.   docusate sodium 100 MG capsule Commonly known as: COLACE Take 1 capsule (100 mg total) by mouth 2 (two) times daily.   DULCOLAX SOFT CHEWS PO Take 2 each  by mouth daily as needed (constipation).   fentaNYL 12 MCG/HR Commonly known as: [place] 1 patch onto the skin every 3 (three) days. Start taking on: December 26, 2021   levothyroxine 75 MCG tablet Commonly known as: SYNTHROID Take 1 tablet (75 mcg total) by mouth daily before breakfast.   lidocaine-prilocaine cream Commonly known as: EMLA Apply 1 application topically daily as needed (port access).   mometasone 50 MCG/ACT nasal spray Commonly known as: NASONEX Place 2 sprays into the nose daily as needed (allergies).   ondansetron 8 MG tablet Commonly known as: ZOFRAN TAKE 1 TABLET BY MOUTH TWICE DAILY AS NEEDED FOR NAUSEA & VOMITING What changed: See the new instructions.   oxyCODONE 5 MG immediate release tablet Commonly known as: Oxy IR/ROXICODONE Take 1 tablet (5 mg total) by mouth every 4 (four) hours as needed for severe pain.   polyethylene glycol 17 g packet Commonly known as: MIRALAX / GLYCOLAX Take 17 g by mouth daily. Start taking on: December 25, 2021   Voltaren 1 % Gel Generic drug: diclofenac Sodium Apply 1 application. topically daily as needed (arthritis pain).        Discharge Exam: Filed Weights   12/19/21 2224  Weight: 98.5 kg   General; NAD  Condition at discharge: stable  The results of significant diagnostics from this hospitalization (including imaging, microbiology, ancillary and laboratory) are listed below for reference.   Imaging Studies: DG Abd 1 View  Result Date: 12/22/2021 CLINICAL DATA:  Abdominal pain. Nausea and vomiting. Partial small bowel obstruction. EXAM: ABDOMEN - 1 VIEW COMPARISON:  Nov 07, 2021 FINDINGS: There is a right-sided pleural effusion with underlying atelectasis. Probable small effusion and atelectasis in the left base. Evaluation for free air is limited due to supine imaging but none is seen. No portal venous gas or pneumatosis. The bowel gas pattern is nonobstructive. No other acute abnormalities. IMPRESSION:  No evidence of bowel obstruction or other abdominal abnormality. Right greater than left small pleural effusions with underlying atelectasis. Electronically Signed   By: Dorise Bullion III M.D.   On: 12/22/2021 08:50   CT L-SPINE NO CHARGE  Result Date: 12/20/2021 CLINICAL DATA:  Nausea/vomiting, Bowel obstruction suspected, Abdominal pain, acute, nonlocalized EXAM: CT LUMBAR SPINE WITHOUT CONTRAST TECHNIQUE: Multidetector CT imaging of the lumbar spine was performed without intravenous contrast administration. Multiplanar CT image reconstructions were also generated. RADIATION DOSE REDUCTION: This exam was performed according to the departmental dose-optimization program which includes automated exposure control, adjustment of the mA and/or kV according to patient size and/or use of iterative reconstruction technique. COMPARISON:  09/23/2021, 11/06/2021 FINDINGS: Segmentation: Variant anatomy with 6 non rib bearing segments of the lumbar spine. Considering the lowest rib-bearing segment as T12 and the first fully incorporated vertebral body within the sacrum as S1, the lowest non rib-bearing segments of the lumbar spine is considered L6 with partial sacralization on the right. Alignment: Mild straightening.  No listhesis. Vertebrae: Osseous structures are diffusely osteopenic. There is interval development of a a mild superior endplate fracture of L1 with minimal  loss of height and no retropulsion, superior endplate fracture of L3 with 10-20% loss of height and no retropulsion, and stable superior endplate fractures of L4 and L5 with mild loss of height since immediate prior examination, but new since remote prior examination of 09/23/2021. Again, no retropulsion associated with these fractures. Fractures of L1 and L3 appears subacute in nature with trace paravertebral edema. No focal lytic or blastic bone lesions. Paraspinal and other soft tissues: Right pleural effusion is partially visualized. Trace  paravertebral edema at L1 and L3 are noted in keeping with subacute superior endplate fractures. No paravertebral fluid collections are identified. Disc levels: There is intervertebral disc space narrowing and endplate remodeling throughout [place] lumbar spine in keeping with changes of moderate to severe degenerative disc disease, most severe at L3-4 and L5-L6. Broad-based posterior disc osteophyte complex in combination with facet hypertrophy at L4-5 results in moderate to severe central canal stenosis with probable impingement of the crossing L5 nerve roots. Spinal canal is otherwise widely patent. No significant neuroforaminal narrowing. IMPRESSION: 1. Variant anatomy with 6 non rib bearing segments of the lumbar spine. Considering the lowest rib-bearing segment as T12 and the first fully incorporated vertebral body within the sacrum as S1, the lowest non rib-bearing segments of the lumbar spine is considered L6 with partial sacralization on the right. 2. Interval development of a mild superior endplate fracture of L1 with minimal loss of height and no retropulsion. 3. Interval development of a superior endplate fracture of L3 with 10-20% loss of height and no retropulsion. 4. Stable superior endplate fractures of L4 and L5 with mild loss of height. 5. Multilevel degenerative disc disease and facet hypertrophy resulting in moderate to severe central canal stenosis at L4-5 with probable impingement of the crossing L5 nerve roots. Electronically Signed   By: Fidela Salisbury M.D.   On: 12/20/2021 01:23   CT Abdomen Pelvis W Contrast  Result Date: 12/20/2021 CLINICAL DATA:  Nausea/vomiting. Bowel obstruction suspected. Abdominal pain, acute, nonlocalized EXAM: CT ABDOMEN AND PELVIS WITH CONTRAST TECHNIQUE: Multidetector CT imaging of the abdomen and pelvis was performed using the standard protocol following bolus administration of intravenous contrast. RADIATION DOSE REDUCTION: This exam was performed according to  the departmental dose-optimization program which includes automated exposure control, adjustment of the mA and/or kV according to patient size and/or use of iterative reconstruction technique. CONTRAST:  150m OMNIPAQUE IOHEXOL 300 MG/ML  SOLN COMPARISON:  11/06/2021 FINDINGS: Lower chest: Moderate right pleural effusion with compressive atelectasis of the right lung base is unchanged. Stable eventration of the posterior left hemidiaphragm. Mild coronary artery calcification. Global cardiac size within normal limits. No pericardial effusion. Hepatobiliary: No focal liver abnormality is seen. No gallstones, gallbladder wall thickening, or biliary dilatation. Pancreas: Unremarkable Spleen: Unremarkable Adrenals/Urinary Tract: Adrenal glands are unremarkable. Kidneys are normal, without renal calculi, focal lesion, or hydronephrosis. Bladder is unremarkable. Stomach/Bowel: Similar prior examination, there is mild fluid dilation of several loops of distal small bowel with fecalization of intraluminal contents in keeping with stasis with a focal point of transition noted within the right lower quadrant in keeping with changes of a partial small bowel obstruction. The degree of proximal bowel dilation appears improved since prior examination. Trace free fluid within the pelvis is unremarkable. No free intraperitoneal gas. The stomach, small bowel, and large bowel are otherwise unremarkable. Appendix normal. Vascular/Lymphatic: Enhancing, rounded right inguinal lymph node is stable measuring 15 mm in short axis diameter. No additional pathologic abdominal or pelvic adenopathy. Moderate aortoiliac atherosclerotic  calcification. No aortic aneurysm. Reproductive: Uterus absent.  Pessary in place. Other: Peritoneal nodularity and nodular mesenteric implants are again identified scattered throughout the abdomen but best appreciated within the left lower quadrant. Additionally, soft tissue mass within the anterior peritoneum  within the right lower quadrant, axial image # 53/2 all appear unchanged in keeping with changes of metastatic ovarian cancer. No new intra-abdominal masses are identified. Musculoskeletal: The osseous structures are diffusely osteopenic. Superior endplate fractures of L3 and L4 are stable. New superior endplate fracture of Z61 and L2 have developed, subacute in etiology with mild loss of height. No retropulsion. IMPRESSION: 1. Partial small bowel obstruction with point of transition within the right lower quadrant of the abdomen. The degree of proximal bowel dilation appears improved since prior examination. 2. Stable peritoneal nodularity and mesenteric implants in keeping with changes of metastatic ovarian cancer. Stable right inguinal adenopathy. 3. Stable moderate right pleural effusion. 4. Interval development of subacute superior endplate fractures of W96 and L2 with mild loss of height. No retropulsion. Stable superior endplate fractures of L3 and L4. Electronically Signed   By: Fidela Salisbury M.D.   On: 12/20/2021 01:09    Microbiology: Results for orders placed or performed during the hospital encounter of 12/19/21  Urine Culture     Status: Abnormal   Collection Time: 12/22/21  7:54 AM   Specimen: Urine, Clean Catch  Result Value Ref Range Status   Specimen Description   Final    URINE, CLEAN CATCH Performed at [facility], [place] [address], [place], [place] [address]    Special Requests   Final    NONE Performed at [facility], [place] [address] [place],  [address]    Culture >=100,000 COLONIES/mL ESCHERICHIA COLI (A)  Final   Report Status 12/24/2021 FINAL  Final   Organism ID, Bacteria ESCHERICHIA COLI (A)  Final      Susceptibility   Escherichia coli - MIC*    AMPICILLIN 8 SENSITIVE Sensitive     CEFAZOLIN <=4 SENSITIVE Sensitive     CEFEPIME <=0.12 SENSITIVE Sensitive     CEFTRIAXONE <=0.25 SENSITIVE Sensitive     CIPROFLOXACIN  <=0.25 SENSITIVE Sensitive     GENTAMICIN <=1 SENSITIVE Sensitive     IMIPENEM <=0.25 SENSITIVE Sensitive     NITROFURANTOIN <=16 SENSITIVE Sensitive     TRIMETH/SULFA <=20 SENSITIVE Sensitive     AMPICILLIN/SULBACTAM <=2 SENSITIVE Sensitive     PIP/TAZO <=4 SENSITIVE Sensitive     * >=100,000 COLONIES/mL ESCHERICHIA COLI    Labs: CBC: Recent Labs  Lab 12/19/21 2315 12/21/21 0600 12/22/21 0556 12/23/21 0500 12/24/21 0606  WBC 4.3 2.4* 3.7* 3.2* 2.1*  NEUTROABS 2.5 1.0* 2.2 1.7 0.8*  HGB 9.6* 9.1* 9.0* 8.6* 8.5*  HCT 28.8* 28.1* 27.5* 26.0* 25.3*  MCV 112.1* 112.9* 111.8* 112.6* 111.5*  PLT 121* 114* 110* 103* 147*   Basic Metabolic Panel: Recent Labs  Lab 12/19/21 2315 12/21/21 0600 12/22/21 0556 12/23/21 0500 12/24/21 0606  NA 141 139 138 139 140  K 3.0* 4.0 3.8 4.0 3.9  CL 111 105 104 105 105  CO2 21* '25 26 26 26  '$ GLUCOSE 101* 103* 100* 102* 99  BUN '15 13 10 '$ 7* 9  CREATININE 0.81 1.02* 0.95 0.93 0.93  CALCIUM 7.8* 9.8 9.4 9.3 9.8  MG 1.5*  --   --   --   --    Liver Function Tests: Recent Labs  Lab 12/19/21 2315  AST 18  ALT  17  ALKPHOS 84  BILITOT 0.5  PROT 5.4*  ALBUMIN 2.8*   CBG: No results for input(s): "GLUCAP" in the last 168 hours.  Discharge time spent: greater than 30 minutes.  Signed: Elmarie Shiley, MD [facility] 12/24/2021

## 2021-12-24 NOTE — Progress Notes (Signed)
NUTRITION NOTE  Consult received for Soft, full liquids diet education. Patient with hx of metastatic R ovarian cancer.  She is s/p ex lap, R salpingo-oophorectomy, omentectomy radical tumor debulking.  She was admitted due to SBO.  Diet advanced to CLD on 6/25 AM, to FLD on 6/26 AM, and to Soft yesterday shortly before noon. She was able to eat 100% of breakfast and 100% of lunch today.   Patient laying in bed with daughter at bedside. Patient was taking Dulcolax soft chews at home PTA. Confirmed online while in patient's room that these do not contain any traces of fiber.   Discussed rationale of low fiber diet, foods that are and are not recommended, appropriate texture for vegetables to be cooked to, encouraged adequate hydration.   Provided patient with Low Fiber Nutrition Therapy handout.   No additional questions or concerns at this time but patient aware to ask for RD to return if additional questions arise.     Jarome Matin, MS, RD, LDN, [place] Registered Dietitian II Inpatient Clinical Nutrition RD pager # and on-call/weekend pager # available in [facility]

## 2021-12-24 NOTE — Progress Notes (Signed)
Subjective: CC: Doing well. Main complaint is back pain. No abdominal pain this or yesterday. Tolerating soft diet yesterday without n/v. Passing flatus. No bm yesterday, last 6/25. Open to suppository today.  Some nausea this morning after medications. No vomiting. No abdominal pain. Able to eat a few bites of breakfast since then. Continues to pass flatus.    Objective: Vital signs in last 24 hours: Temp:  [98.1 F (36.7 C)-98.6 F (37 C)] 98.3 F (36.8 C) (06/28 0534) Pulse Rate:  [71-79] 71 (06/28 0534) Resp:  [18-23] 18 (06/28 0534) BP: (120-139)/(57-63) 139/61 (06/28 0534) SpO2:  [93 %-95 %] 93 % (06/28 0534) Last BM Date : 12/21/21  Intake/Output from previous day: 06/27 0701 - 06/28 0700 In: 240 [P.O.:240] Out: 1700 [Urine:1700] Intake/Output this shift: No intake/output data recorded.  PE: Gen:  Alert, NAD, pleasant Pulm: rate and effort normal Abd: ND and very soft with very mild ttp the suprapubic and rlq without peritonitis, +BS,  Psych: A&Ox3   Lab Results:  Recent Labs    12/23/21 0500 12/24/21 0606  WBC 3.2* 2.1*  HGB 8.6* 8.5*  HCT 26.0* 25.3*  PLT 103* 106*   BMET Recent Labs    12/23/21 0500 12/24/21 0606  NA 139 140  K 4.0 3.9  CL 105 105  CO2 26 26  GLUCOSE 102* 99  BUN 7* 9  CREATININE 0.93 0.93  CALCIUM 9.3 9.8   PT/INR No results for input(s): "LABPROT", "INR" in the last 72 hours. CMP     Component Value Date/Time   NA 140 12/24/2021 0606   K 3.9 12/24/2021 0606   CL 105 12/24/2021 0606   CO2 26 12/24/2021 0606   GLUCOSE 99 12/24/2021 0606   BUN 9 12/24/2021 0606   CREATININE 0.93 12/24/2021 0606   CREATININE 1.14 (H) 12/08/2021 1038   CALCIUM 9.8 12/24/2021 0606   PROT 5.4 (L) 12/19/2021 2315   ALBUMIN 2.8 (L) 12/19/2021 2315   AST 18 12/19/2021 2315   AST 18 12/08/2021 1038   ALT 17 12/19/2021 2315   ALT 23 12/08/2021 1038   ALKPHOS 84 12/19/2021 2315   BILITOT 0.5 12/19/2021 2315   BILITOT 0.3 12/08/2021  1038   GFRNONAA >60 12/24/2021 0606   GFRNONAA 49 (L) 12/08/2021 1038   GFRAA >60 03/25/2020 1144   Lipase     Component Value Date/Time   LIPASE 18 12/19/2021 2315    Studies/Results: No results found.  Anti-infectives: Anti-infectives (From admission, onward)    Start     Dose/Rate Route Frequency Ordered Stop   12/22/21 1700  cefTRIAXone (ROCEPHIN) 1 g in sodium chloride 0.9 % 100 mL IVPB        1 g 200 mL/hr over 30 Minutes Intravenous Daily-1800 12/22/21 1600          Assessment/Plan SBO - CT 6/24 w/ psbo with transition of the RLQ.  - She has a hx of metastatic R ovarian CA s/p Ex lap, R salpingo-oophorectomy, omentectomy radical tumor debulking for ovarian cancer by Dr. Denman George in 2020. She is followed by Dr. Alvy Bimler and currently on Chemo with last dose ~2 weeks ago. Her CT scan also showed stable peritoneal nodularity, mesenteric implants and right inguinal adenopathy. Onc following. They have discussed case with GYN Onc Surgery and plan to switch her tx upon d/c in case her recurrent sbo is related to worsening disease control.  - I do not think she needs emergency surgery  - Clinically and  radiographically resolving. Xray yesterday with non-obstructive bowel gas pattern. She was tolerating soft diet yesterday with resolution of abdominal pain, reassuring exam and having bowel function. Little nauseated this morning. Exam still reassuring. Would keep through lunch to ensure this resolves and she tolerates. If does okay, can be d/c'd from our standpoint.    FEN - Soft diet, IVF per [facility]. Bowel regimen. Suppository  VTE - SCDs, Eliquis  ID - Rocephin for UTI   - Per [facility] -  Hx Met R ovarian CA UTI - On abx Hx PE - on Eliquis HTN Hx CVA Hx CKD3 Vertebral compression fx's - follows with Dr. Nelva Bush, Ortho has seen here   I reviewed nursing notes, ED provider notes, Consultant (Onc) notes, last 24 h vitals and pain scores, last 48 h intake and output, last 24 h labs and  trends, and last 24 h imaging results   LOS: 4 days    Jillyn Ledger , [facility] 12/24/2021, 9:18 AM Please see Amion for pager number during day hours 7:00am-4:30pm

## 2022-01-01 ENCOUNTER — Other Ambulatory Visit: Payer: Self-pay | Admitting: Hematology and Oncology

## 2022-01-05 MED FILL — Dexamethasone Sodium Phosphate Inj 100 MG/10ML: INTRAMUSCULAR | Qty: 1 | Status: AC

## 2022-01-05 MED FILL — Fosaprepitant Dimeglumine For IV Infusion 150 MG (Base Eq): INTRAVENOUS | Qty: 5 | Status: AC

## 2022-01-06 ENCOUNTER — Ambulatory Visit: Payer: Medicare Other

## 2022-01-06 ENCOUNTER — Inpatient Hospital Stay: Payer: Medicare Other | Attending: Gynecologic Oncology | Admitting: Hematology and Oncology

## 2022-01-06 ENCOUNTER — Other Ambulatory Visit: Payer: Self-pay

## 2022-01-06 ENCOUNTER — Other Ambulatory Visit: Payer: Medicare Other

## 2022-01-06 DIAGNOSIS — Z7901 Long term (current) use of anticoagulants: Secondary | ICD-10-CM | POA: Diagnosis not present

## 2022-01-06 DIAGNOSIS — Z9221 Personal history of antineoplastic chemotherapy: Secondary | ICD-10-CM | POA: Diagnosis not present

## 2022-01-06 DIAGNOSIS — R634 Abnormal weight loss: Secondary | ICD-10-CM | POA: Insufficient documentation

## 2022-01-06 DIAGNOSIS — Z90721 Acquired absence of ovaries, unilateral: Secondary | ICD-10-CM | POA: Diagnosis not present

## 2022-01-06 DIAGNOSIS — N183 Chronic kidney disease, stage 3 unspecified: Secondary | ICD-10-CM | POA: Diagnosis not present

## 2022-01-06 DIAGNOSIS — K56609 Unspecified intestinal obstruction, unspecified as to partial versus complete obstruction: Secondary | ICD-10-CM | POA: Insufficient documentation

## 2022-01-06 DIAGNOSIS — I1310 Hypertensive heart and chronic kidney disease without heart failure, with stage 1 through stage 4 chronic kidney disease, or unspecified chronic kidney disease: Secondary | ICD-10-CM | POA: Diagnosis not present

## 2022-01-06 DIAGNOSIS — S22000A Wedge compression fracture of unspecified thoracic vertebra, initial encounter for closed fracture: Secondary | ICD-10-CM | POA: Insufficient documentation

## 2022-01-06 DIAGNOSIS — C786 Secondary malignant neoplasm of retroperitoneum and peritoneum: Secondary | ICD-10-CM | POA: Diagnosis not present

## 2022-01-06 DIAGNOSIS — Z79899 Other long term (current) drug therapy: Secondary | ICD-10-CM | POA: Diagnosis not present

## 2022-01-06 DIAGNOSIS — C561 Malignant neoplasm of right ovary: Secondary | ICD-10-CM | POA: Diagnosis present

## 2022-01-06 DIAGNOSIS — I2699 Other pulmonary embolism without acute cor pulmonale: Secondary | ICD-10-CM | POA: Diagnosis not present

## 2022-01-06 DIAGNOSIS — Z7189 Other specified counseling: Secondary | ICD-10-CM | POA: Diagnosis not present

## 2022-01-06 NOTE — Progress Notes (Signed)
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[facility]) 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 Treatment options are limited We discussed the risk, benefits, side effects of gemcitabine and topotecan briefly, emphasizing side effects from the perspective of pancytopenia and low benefits expected I estimated the chances of her responding to either drug is 20% or less The expected benefit in general is in the form of disease control for 3 months at most before she would need another line of treatment Both treatment can cause severe pancytopenia that could increase her risk of complications and chemotherapy can potentially shorten her life At the end of the discussion, she is undecided She will discuss this further with her family and call me with final decision  Intermittent small bowel obstruction ([place]) She has intermittent small bowel obstruction secondary to adhesions as well as disease With aggressive dietary changes, she is able to eat frequent small meals but still have persistent nausea and pain She have lost some weight She will continue her modified diet as best as she could  Compression fracture of body of thoracic vertebra ([place]) She has severe back pain secondary to  compression fracture She will continue pain medicine We discussed narcotic refill policy I explained to the patient and family why surgery is not indicated  Goals of care, counseling/discussion We had numerous goals of care discussions in the past Her condition is not curable I estimated her prognosis is poor even if she were to attempt palliative chemotherapy It is not likely she will survive past 1 year, more than likely 6 to 9 months We discussed the role of palliative care and hospice She is undecided  No orders of the defined types were placed in this encounter.   All questions were answered. The patient knows to call the clinic with any problems, questions or concerns. The total time spent in the appointment was 40 minutes encounter with patients including review of chart and various tests results, discussions about plan of care and coordination of care plan   [name], MD 01/06/2022 4:14 PM  INTERVAL HISTORY: Please see below for problem oriented charting. she returns for review of plan of care with her family She was discharged from the hospital recently due to recurrent bowel obstruction Her pain is well controlled She has intermittent abdominal pain and persistent back pain She has intermittent nausea without vomiting No recent constipation She has lost weight She is attempting modified diet with frequent small meals, low residual and soft diet The patient denies any recent signs or symptoms of bleeding such as spontaneous epistaxis, hematuria or hematochezia.   REVIEW OF SYSTEMS:   Constitutional: Denies fevers, chills  Eyes: Denies blurriness of vision Ears, nose, mouth, throat, and face: Denies mucositis or sore throat Respiratory: Denies cough, dyspnea or wheezes Cardiovascular: Denies palpitation,  chest discomfort or lower extremity swelling Skin: Denies abnormal skin rashes Lymphatics: Denies new lymphadenopathy or easy bruising Neurological:Denies  numbness, tingling or new weaknesses Behavioral/Psych: Mood is stable, no new changes  All other systems were reviewed with the patient and are negative.  I have reviewed the past medical history, past surgical history, social history and family history with the patient and they are unchanged from previous note.  ALLERGIES:  has No Known Allergies.  MEDICATIONS:  Current Outpatient Medications  Medication Sig Dispense Refill   acetaminophen (TYLENOL) 500 MG tablet Take 1,000 mg by mouth every 6 (six) hours as needed for mild pain or fever.     anti-nausea (EMETROL) solution Take 10 mLs by mouth every 15 (fifteen) minutes as needed for nausea or vomiting.     apixaban (ELIQUIS) 5 MG TABS tablet Take 1 tablet (5 mg total) by mouth 2 (two) times daily. 60 tablet 3   atenolol (TENORMIN) 25 MG tablet Take 1 tablet (25 mg total) by mouth daily. 30 tablet 1   diclofenac Sodium (VOLTAREN) 1 % GEL Apply 1 application. topically daily as needed (arthritis pain).     docusate sodium (COLACE) 100 MG capsule Take 1 capsule (100 mg total) by mouth 2 (two) times daily. 10 capsule 0   fentaNYL (DURAGESIC) 12 MCG/HR Place 1 patch onto the skin every 3 (three) days. 5 patch 0   levothyroxine (SYNTHROID) 75 MCG tablet Take 1 tablet (75 mcg total) by mouth daily before breakfast. 30 tablet 3   lidocaine-prilocaine (EMLA) cream Apply 1 application topically daily as needed (port access). 30 g 3   Magnesium Hydroxide (DULCOLAX SOFT CHEWS PO) Take 2 each by mouth daily as needed (constipation).     mometasone (NASONEX) 50 MCG/ACT nasal spray Place 2 sprays into the nose daily as needed (allergies).     ondansetron (ZOFRAN) 8 MG tablet TAKE 1 TABLET BY MOUTH TWICE DAILY AS NEEDED FOR NAUSEA & VOMITING (Patient taking differently: Take 8 mg by mouth 2 (two) times daily as needed for nausea or vomiting.) 30 tablet 1   oxyCODONE (OXY IR/ROXICODONE) 5 MG immediate release tablet Take 1 tablet (5 mg total) by mouth every  4 (four) hours as needed for severe pain. 60 tablet 0   polyethylene glycol (MIRALAX / GLYCOLAX) 17 g packet Take 17 g by mouth daily. 14 each 0   No current facility-administered medications for this visit.    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  She has bloating and early satiety. Shortness of breath with walking. She denied bleeding. She reported constipation with pain with defecation and narrowed stools   11/29/2018 Imaging   1. 13 cm complex cystic lesion in the central pelvis, highly suspicious for ovarian cystadenocarcinoma. 2. Diffuse peritoneal carcinomatosis with mild ascites. 3. Mild lymphadenopathy in porta hepatis and right cardiophrenic angle, suspicious for metastatic disease. 4. Moderate right and tiny left pleural effusions   12/05/2018 Tumor Marker   Patient's tumor was tested for the following markers: CA-125 Results of the tumor marker test revealed 1015.   12/06/2018 Cancer Staging   Staging form: Ovary, Fallopian Tube, and Primary Peritoneal Carcinoma, AJCC 8th Edition - Clinical: FIGO Stage IVA, calculated as Stage IV (cT3c, cN1, cM1) - Signed by [name], MD on 12/06/2018  12/09/2018 Pathology Results   PLEURAL FLUID, RIGHT (SPECIMEN 1 OF 1 COLLECTED 12/09/18): - MALIGNANT CELLS CONSISTENT WITH METASTATIC ADENOCARCINOMA - SEE COMMENT Comment The neoplastic cells are positive for cytokeratin 7 and Pax-8 but negative for cytokeratin 20, TTF-1, CDX-2 and Gata-3. Overall, the phenotype is consistent with the clinical impression of gynecologic primary.    12/15/2018 Procedure   Placement of single lumen port a cath via right internal jugular vein. The catheter tip lies at the cavo-atrial junction. A power injectable port a cath was  placed and is ready for immediate use   12/16/2018 - 08/24/2019 Chemotherapy   The patient had carboplatin and taxol for chemotherapy treatment.  She had 7 cycles given neoadjuvant prior to surgery and 3 more cycles after surgery, for a total of 10 cycles of treatment    01/06/2019 Tumor Marker   Patient's tumor was tested for the following markers: CA-125 Results of the tumor marker test revealed 948   02/02/2019 Imaging   1. Massive pulmonary embolism, as discussed above. Given the mildly elevated RV to LV ratio of 0.95, this is associated with increased risk of morbidity and mortality. 2. Today's study demonstrates a mixed response to therapy. Specifically, while there has been regression of the bulky intraperitoneal metastatic disease and regression of previously noted pleural effusions, the large cystic mass in the central pelvis has increased in size compared to the prior study. 3. New right mild hydroureteronephrosis related to extrinsic compression on the distal third of the right ureter by the patient's large pelvic mass. 4. Scattered small pulmonary nodules (predominantly pleural based) appear stable compared to prior examinations. These are nonspecific but warrant continued attention on follow-up studies. 5. Aortic atherosclerosis, in addition to three-vessel coronary artery disease. Assessment for potential risk factor modification, dietary therapy or pharmacologic therapy may be warranted, if clinically indicated.   02/02/2019 - 02/04/2019 Hospital Admission   She was admitted the hospital due to DVT and PE   02/03/2019 Imaging   Bilateral venous Doppler US Right: Findings consistent with acute deep vein thrombosis involving the right femoral vein, right popliteal vein, right peroneal veins, right soleal veins, and right gastrocnemius veins. No cystic structure found in the popliteal fossa. Left: There is no evidence of deep vein thrombosis in the lower extremity. No cystic structure found  in the popliteal fossa   02/17/2019 Tumor Marker   Patient's tumor was tested for the following markers: CA-125 Results of the tumor marker test revealed 127   03/10/2019 Tumor Marker   Patient's tumor was tested for the following markers: CA-125. Results of the tumor marker test revealed 87.4   03/14/2019 - 03/16/2019 Hospital Admission   She was admitted to the hospital recently for weakness   03/31/2019 Tumor Marker   Patient's tumor was tested for the following markers: CA-125 Results of the tumor marker test revealed 59.2.   04/28/2019 Tumor Marker   Patient's tumor was tested for the following markers: CA-125 Results of the tumor marker test revealed 56.8   05/05/2019 Imaging   1. Interval decrease in size of the large cystic mass in the central pelvis. Mesenteric and omental soft tissue disease shows no substantial interval change. 2. The mild right hydroureteronephrosis seen previously has resolved in the interval. 3. Small residual nonobstructive thrombus identified in the inter lobar pulmonary artery common here into the lateral wall compatible with chronicity. 4. 14 mm subtle enhancing lesion in the anterior right liver is stable. This may be vascular malformation. Attention on follow-up  recommended. 5. Stable appearance of the multiple small bilateral pulmonary nodules. Continued attention on follow-up recommended. 6.  Aortic Atherosclerois (ICD10-170.0)     06/06/2019 Pathology Results   A. OMENTUM, RESECTION: - Metastatic carcinoma. B. RIGHT FALLOPIAN TUBE AND OVARY, SALPINGOOOPHORECTOMY: - High-grade serous carcinoma, spanning 9 cm. - No surface involvement identified. - Fallopian tube involved by carcinoma. - See oncology table. C. PERITONEAL NODULE, EXCISION: - Metastatic carcinoma. ONCOLOGY TABLE: OVARY or FALLOPIAN TUBE or PRIMARY PERITONEUM: Procedure: Right salpingo-oophorectomy, omental resection, and peritoneal biopsy. Specimen Integrity: Intact Tumor Site:  Right ovary Ovarian Surface Involvement (required only if applicable): Not identified Tumor Size: 9 cm Histologic Type: High-grade serous carcinoma Histologic Grade: High-grade Implants (required for advanced stage serous/seromucinous borderline tumors only): Omentum, peritoneum. Other Tissue/ Organ Involvement: Right fallopian tube Largest Extrapelvic Peritoneal Focus (required only if applicable): 7.3 cm Peritoneal/Ascitic Fluid: Positive pleural fluid pre neoadjuvant therapy. Treatment Effect (required only for high-grade serous carcinomas): Probable treatment effect present. Regional Lymph Nodes: No lymph nodes submitted or found Pathologic Stage Classification (pTNM, AJCC 8th Edition): ypT3c, ypNX Representative Tumor Block: B2   06/06/2019 Surgery   Preoperative Diagnosis: stage IV ovarian cancer, s/p neoadjuvant chemotherapy, history of recent PE.  Procedure(s) Performed: Exploratory laparotomy with right salpingo-oophorectomy, omentectomy radical tumor debulking for ovarian cancer .   Surgeon: Thereasa Solo, MD.    Operative Findings:  Omental cake adherent to anterior abdominal wall and hepatic flexure. 10cm right tube and ovary. Surgically absent uterus and left tube and ovary. Granular nodularity across right diaphragm.    This represented an optimal cytoreduction (R0) with no gross visible disease remaining.    07/06/2019 Tumor Marker   Patient's tumor was tested for the following markers:CA-125 Results of the tumor marker test revealed 16.4   08/10/2019 Genetic Testing   Negative genetic testing. No pathogenic variants identified. VUS in BRCA2 called [id number] identified on the [facility] CancerNext+RNAinsight panel. The report date is 08/10/2019.  The CancerNext+RNAinsight gene panel offered by [employer] includes sequencing and rearrangement analysis for the following 36 genes: APC*, ATM*, AXIN2, BARD1, BMPR1A, BRCA1*, BRCA2*, BRIP1*, CDH1*, CDK4, CDKN2A, CHEK2*, DICER1, MLH1*,  MSH2*, MSH3, MSH6*, MUTYH*, NBN, NF1*, NTHL1, PALB2*, PMS2*, PTEN*, RAD51C*, RAD51D*, RECQL, SMAD4, SMARCA4, STK11 and TP53* (sequencing and deletion/duplication); HOXB13, POLD1 and POLE (sequencing only); EPCAM and GREM1 (deletion/duplication only). DNA and RNA analyses performed for * genes.   HRD (somatic) testing was initially ordered and was not completed due to insufficient amount of tumor sample.    08/24/2019 Tumor Marker   Patient's tumor was tested for the following markers: CA-125 Results of the tumor marker test revealed 12.2   09/20/2019 Imaging   1. Interval debulking, bilateral salpingo oophorectomy and omentectomy with resection of the dominant pelvic cystic lesion and the bandlike soft tissue seen previously in the right mesentery/omentum. Today's study is status shows new postoperative baseline for follow-up. 2. 6 mm gastrohepatic ligament nodule seen on the previous study is stable. Continued attention on follow-up recommended. 3. No new or progressive findings in the chest, abdomen, or pelvis to suggest disease progression. 4. Stable 1.4 cm focus of homogeneous enhancement in the anterior right liver. Stable since at least 11/29/2018 and likely benign. Continued attention on follow-up recommended. 5. Aortic Atherosclerosis (ICD10-I70.0).   09/20/2019 Tumor Marker   Patient's tumor was tested for the following markers: CA-125 Results of the tumor marker test revealed 11.9   12/26/2019 Tumor Marker   Patient's tumor was tested for the following markers: CA-125 Results of  the tumor marker test revealed 16.5   03/25/2020 Imaging   1. Interval progression of peritoneal disease which predominantly involves the serosal surface of the proximal and distal transverse colon, and descending colon. 2. New bilateral inguinal adenopathy. 3. No ascites. 4. Aortic atherosclerosis.   03/25/2020 Tumor Marker   Patient's tumor was tested for the following markers: CA-125 Results of the tumor  marker test revealed 249   03/28/2020 Echocardiogram    1. Borderline LV Strain; A2C view represents the most accurate acquisition.. Left ventricular ejection fraction, by estimation, is 60 to 65%. Left ventricular ejection fraction by PLAX is 63 %. The left ventricle has normal function. The left ventricle demonstrates regional wall motion abnormalities (see scoring diagram/findings for description). Left ventricular diastolic parameters were normal.  2. Right ventricular systolic function is normal. The right ventricular size is normal.  3. Left atrial size was mildly dilated.  4. The mitral valve is normal in structure. No evidence of mitral valve regurgitation.  5. The aortic valve is grossly normal. Aortic valve regurgitation is mild.  6. The inferior vena cava is normal in size with greater than 50% respiratory variability, suggesting right atrial pressure of 3 mmHg.   04/02/2020 Tumor Marker   Patient's tumor was tested for the following markers: CA-125 Results of the tumor marker test revealed 304   04/02/2020 - 08/22/2020 Chemotherapy   The patient had carboplatin and Doxil for chemotherapy treatment.     05/27/2020 Tumor Marker   Patient's tumor was tested for the following markers: CA-125. Results of the tumor marker test revealed 42.7   06/10/2020 Imaging   Improving peritoneal disease and serosal implants along the left colon, as above.   Improving bilateral inguinal nodes.   06/18/2020 Echocardiogram    1. Left ventricular ejection fraction, by estimation, is 60 to 65%. The left ventricle has normal function. The left ventricle has no regional wall motion abnormalities. There is mild left ventricular hypertrophy of the basal-septal segment. Left ventricular diastolic parameters were normal. The average left ventricular global longitudinal strain is -25.0 %. The global longitudinal strain is normal.  2. Right ventricular systolic function is normal. The right ventricular size is  normal.  3. The mitral valve is grossly normal. Mild mitral valve regurgitation.  4. Tricuspid valve regurgitation is moderate.  5. The aortic valve is tricuspid. There is mild calcification of the aortic valve. There is mild thickening of the aortic valve. Aortic valve regurgitation is mild to moderate.  6. The inferior vena cava is normal in size with <50% respiratory variability, suggesting right atrial pressure of 8 mmHg.   06/25/2020 Tumor Marker   Patient's tumor was tested for the following markers: CA-125 Results of the tumor marker test revealed 23.9   07/15/2020 Imaging   IMPRESSION: 1. Mild amount of acute pulmonary embolism within multiple middle lobe and lower lobe branches of the right pulmonary artery. 2.   Small amount of chronic right pulmonary embolism. 3.   Mild posterior left basilar atelectasis and/or infiltrate. 4.   Small left pleural effusion. 5. Aortic atherosclerosis.   Aortic Atherosclerosis (ICD10-I70.0).     07/15/2020 - 07/17/2020 Hospital Admission   She was admitted to a local hospital after presentation with chest pain and shortness of breath and was found to have significant pulmonary embolism She was anticoagulated and discharged   07/16/2020 Echocardiogram   1. Left ventricular ejection fraction, by estimation, is 60 to 65%. The left ventricle has normal function. The left ventricle has no  regional wall motion abnormalities. There is mild left ventricular hypertrophy.  Left ventricular diastolic parameters are indeterminate.   2. Right ventricular systolic function is normal. The right ventricular size is normal. There is normal pulmonary artery systolic pressure. The estimated right ventricular systolic pressure is 12.7 mmHg.   3. The mitral valve is grossly normal. Trivial mitral valve regurgitation.   4. The aortic valve is tricuspid. Aortic valve regurgitation is mild.   5. The inferior vena cava is normal in size with greater than 50% respiratory  variability, suggesting right atrial pressure of 3 mmHg.    07/25/2020 Tumor Marker   Patient's tumor was tested for the following markers: CA-125 Results of the tumor marker test revealed 36.4   09/09/2020 Imaging   1. Mild improvement in peritoneal and serosal metastasis along the splenic flexure and descending colon. 2. Inguinal lymph nodes are slightly increased in size. Lymph nodes increased in size from 06/10/2020 and similar to CT of 03/25/2020. 3. No new peritoneal disease.  No free fluid   09/21/2020 - 10/21/2020 Chemotherapy   She started taking Niraparib, discontinued due to intolerable side effects from headache and hypertension       10/01/2020 Tumor Marker   Patient's tumor was tested for the following markers: CA-125 Results of the tumor marker test revealed 83.4.   10/16/2020 Imaging   No acute findings in the abdomen or pelvis.   Hepatic steatosis.   Large stool burden throughout the colon.       12/03/2020 Tumor Marker   Patient's tumor was tested for the following markers: CA-125 Results of the tumor marker test revealed 146   12/20/2020 Imaging   1. Signs of peritoneal carcinomatosis. No significant interval change from previous exam. 2. No findings of solid organ metastasis or nodal metastasis within the abdomen or pelvis. Enlarged right inguinal node is mildly increased in size. Stable enlarged left inguinal node. 3. No evidence for metastatic disease to the chest. 4. Aortic atherosclerosis. Coronary artery calcifications.   12/27/2020 - 03/07/2021 Chemotherapy    Patient is on PACLITAXEL       02/07/2021 Tumor Marker   Patient's tumor was tested for the following markers: CA-125. Results of the tumor marker test revealed 395.   02/28/2021 Tumor Marker   Patient's tumor was tested for the following markers: CA-125. Results of the tumor marker test revealed 548.   03/20/2021 Imaging   1. Interval enlargement of multiple peritoneal soft tissue masses and  nodules. 2. Interval enlargement of portacaval lymph nodes as well as slight interval enlargement of bilateral inguinal lymph nodes. 3. Findings are consistent with worsened nodal and peritoneal metastatic disease. 4. Status post hysterectomy.   Aortic Atherosclerosis (ICD10-I70.0).   03/27/2021 Echocardiogram   1. Left ventricular ejection fraction, by estimation, is 60 to 65%. The left ventricle has normal function. The left ventricle has no regional wall motion abnormalities. There is mild asymmetric left ventricular hypertrophy of the basal-septal segment. Left ventricular diastolic parameters were normal.  2. Right ventricular systolic function is normal. The right ventricular size is normal.  3. The mitral valve is normal in structure. Trivial mitral valve regurgitation. No evidence of mitral stenosis.  4. The aortic valve is tricuspid. Aortic valve regurgitation is moderate. No aortic stenosis is present.  5. The inferior vena cava is normal in size with greater than 50% respiratory variability, suggesting right atrial pressure of 3 mmHg.   03/28/2021 - 12/08/2021 Chemotherapy   Patient is on Treatment Plan : OVARIAN RECURRENT  Liposomal Doxorubicin + Carboplatin q28d X 6 Cycles     03/28/2021 Tumor Marker   Patient's tumor was tested for the following markers: CA-125. Results of the tumor marker test revealed 991.   04/28/2021 Tumor Marker   Patient's tumor was tested for the following markers: CA-125. Results of the tumor marker test revealed 992.   06/16/2021 Imaging   Improving abdominopelvic lymphadenopathy, including a dominant 16 mm short axis right inguinal node, as above.   Improving peritoneal carcinomatosis, including a dominant 1.2 x 4.6 cm implant beneath the right mid abdominal wall.   06/25/2021 - 07/01/2021 Hospital Admission   She was hospitalized for subacute bowel obstruction, managed conservatively   06/30/2021 Echocardiogram   1. Left ventricular ejection  fraction, by estimation, is 60 to 65%. The left ventricle has normal function. The left ventricle has no regional wall motion abnormalities. There is mild left ventricular hypertrophy. Left ventricular diastolic parameters are consistent with Grade I diastolic dysfunction (impaired relaxation). The average left ventricular global longitudinal strain is -18.2 %. The global longitudinal strain is normal.   2. Right ventricular systolic function is normal. The right ventricular size is normal. Tricuspid regurgitation signal is inadequate for assessing PA pressure.   3. The mitral valve is normal in structure. Trivial mitral valve regurgitation. No evidence of mitral stenosis.   4. The aortic valve is tricuspid. Aortic valve regurgitation is mild to moderate. No aortic stenosis is present.    09/24/2021 Imaging   1. Peritoneal carcinomatosis, stable from December 2022. 2. Right inguinal adenopathy has decreased in size slightly in the interval. 3. Tiny right pleural effusion, increased. 4. Aortic atherosclerosis (ICD10-I70.0). Coronary artery calcification.   10/27/2021 Echocardiogram    1. No significant change in global longitudinal strain. Left ventricular ejection fraction, by estimation, is 55 to 60%. The left ventricle has normal function. The left ventricle has no regional wall motion abnormalities. Left ventricular diastolic parameters are consistent with Grade I diastolic dysfunction (impaired relaxation). The average left ventricular global longitudinal strain is -16.8 %. The global longitudinal strain is normal.  2. Right ventricular systolic function is normal. The right ventricular size is normal. There is normal pulmonary artery systolic pressure.  3. Left atrial size was mild to moderately dilated.  4. The mitral valve is degenerative. Mild to moderate mitral valve regurgitation.  5. The aortic valve is tricuspid. Aortic valve regurgitation is mild. Aortic valve sclerosis/calcification is  present, without any evidence of aortic stenosis.  6. The inferior vena cava is normal in size with greater than 50% respiratory variability, suggesting right atrial pressure of 3 mmHg.   11/06/2021 Imaging   1. Mildly distended fluid-filled small bowel in the pelvis with fecalization of enteric contents and subtle adjacent edema in the subtending mesentery. No discrete transition zone evident. Imaging features are compatible with small bowel stricture or evolving in this patient with a reported history small bowel obstruction. 2. Stable appearance of the right omental soft tissue lesion with scattered peritoneal and mesenteric nodules of ovarian cancer. 3. Small right pleural effusion with right lower lobe atelectasis. 4. Aortic Atherosclerosis (ICD10-I70.0).   12/20/2021 Imaging   1. Partial small bowel obstruction with point of transition within the right lower quadrant of the abdomen. The degree of proximal bowel dilation appears improved since prior examination. 2. Stable peritoneal nodularity and mesenteric implants in keeping with changes of metastatic ovarian cancer. Stable right inguinal adenopathy. 3. Stable moderate right pleural effusion. 4. Interval development of subacute superior endplate fractures of  T12 and L2 with mild loss of height. No retropulsion. Stable superior endplate fractures of L3 and L4     PHYSICAL EXAMINATION: ECOG PERFORMANCE STATUS: 2 - Symptomatic, <50% confined to bed  Vitals:   01/06/22 0953  BP: (!) 135/51  Pulse: 68  Resp: 18  Temp: 97.9 F (36.6 C)  SpO2: 95%   Filed Weights   01/06/22 0953  Weight: 211 lb 6.4 oz (95.9 kg)    GENERAL:alert, no distress and comfortable NEURO: alert & oriented x 3 with fluent speech, no focal motor/sensory deficits  LABORATORY DATA:  I have reviewed the data as listed    Component Value Date/Time   NA 140 12/24/2021 0606   K 3.9 12/24/2021 0606   CL 105 12/24/2021 0606   CO2 26 12/24/2021 0606   GLUCOSE  99 12/24/2021 0606   BUN 9 12/24/2021 0606   CREATININE 0.93 12/24/2021 0606   CREATININE 1.14 (H) 12/08/2021 1038   CALCIUM 9.8 12/24/2021 0606   PROT 5.4 (L) 12/19/2021 2315   ALBUMIN 2.8 (L) 12/19/2021 2315   AST 18 12/19/2021 2315   AST 18 12/08/2021 1038   ALT 17 12/19/2021 2315   ALT 23 12/08/2021 1038   ALKPHOS 84 12/19/2021 2315   BILITOT 0.5 12/19/2021 2315   BILITOT 0.3 12/08/2021 1038   GFRNONAA >60 12/24/2021 0606   GFRNONAA 49 (L) 12/08/2021 1038   GFRAA >60 03/25/2020 1144    No results found for: "SPEP", "UPEP"  Lab Results  Component Value Date   WBC 2.1 (L) 12/24/2021   NEUTROABS 0.8 (L) 12/24/2021   HGB 8.5 (L) 12/24/2021   HCT 25.3 (L) 12/24/2021   MCV 111.5 (H) 12/24/2021   PLT 106 (L) 12/24/2021      Chemistry      Component Value Date/Time   NA 140 12/24/2021 0606   K 3.9 12/24/2021 0606   CL 105 12/24/2021 0606   CO2 26 12/24/2021 0606   BUN 9 12/24/2021 0606   CREATININE 0.93 12/24/2021 0606   CREATININE 1.14 (H) 12/08/2021 1038      Component Value Date/Time   CALCIUM 9.8 12/24/2021 0606   ALKPHOS 84 12/19/2021 2315   AST 18 12/19/2021 2315   AST 18 12/08/2021 1038   ALT 17 12/19/2021 2315   ALT 23 12/08/2021 1038   BILITOT 0.5 12/19/2021 2315   BILITOT 0.3 12/08/2021 1038       RADIOGRAPHIC STUDIES: I have personally reviewed the radiological images as listed and agreed with the findings in the report. DG Abd 1 View  Result Date: 12/22/2021 CLINICAL DATA:  Abdominal pain. Nausea and vomiting. Partial small bowel obstruction. EXAM: ABDOMEN - 1 VIEW COMPARISON:  Nov 07, 2021 FINDINGS: There is a right-sided pleural effusion with underlying atelectasis. Probable small effusion and atelectasis in the left base. Evaluation for free air is limited due to supine imaging but none is seen. No portal venous gas or pneumatosis. The bowel gas pattern is nonobstructive. No other acute abnormalities. IMPRESSION: No evidence of bowel obstruction  or other abdominal abnormality. Right greater than left small pleural effusions with underlying atelectasis. Electronically Signed   By: Dorise Bullion III M.D.   On: 12/22/2021 08:50   CT L-SPINE NO CHARGE  Result Date: 12/20/2021 CLINICAL DATA:  Nausea/vomiting, Bowel obstruction suspected, Abdominal pain, acute, nonlocalized EXAM: CT LUMBAR SPINE WITHOUT CONTRAST TECHNIQUE: Multidetector CT imaging of the lumbar spine was performed without intravenous contrast administration. Multiplanar CT image reconstructions were also generated. RADIATION DOSE REDUCTION:  This exam was performed according to the departmental dose-optimization program which includes automated exposure control, adjustment of the mA and/or kV according to patient size and/or use of iterative reconstruction technique. COMPARISON:  09/23/2021, 11/06/2021 FINDINGS: Segmentation: Variant anatomy with 6 non rib bearing segments of the lumbar spine. Considering the lowest rib-bearing segment as T12 and the first fully incorporated vertebral body within the sacrum as S1, the lowest non rib-bearing segments of the lumbar spine is considered L6 with partial sacralization on the right. Alignment: Mild straightening.  No listhesis. Vertebrae: Osseous structures are diffusely osteopenic. There is interval development of a a mild superior endplate fracture of L1 with minimal loss of height and no retropulsion, superior endplate fracture of L3 with 10-20% loss of height and no retropulsion, and stable superior endplate fractures of L4 and L5 with mild loss of height since immediate prior examination, but new since remote prior examination of 09/23/2021. Again, no retropulsion associated with these fractures. Fractures of L1 and L3 appears subacute in nature with trace paravertebral edema. No focal lytic or blastic bone lesions. Paraspinal and other soft tissues: Right pleural effusion is partially visualized. Trace paravertebral edema at L1 and L3 are  noted in keeping with subacute superior endplate fractures. No paravertebral fluid collections are identified. Disc levels: There is intervertebral disc space narrowing and endplate remodeling throughout [place]ora lumbar spine in keeping with changes of moderate to severe degenerative disc disease, most severe at L3-4 and L5-L6. Broad-based posterior disc osteophyte complex in combination with facet hypertrophy at L4-5 results in moderate to severe central canal stenosis with probable impingement of the crossing L5 nerve roots. Spinal canal is otherwise widely patent. No significant neuroforaminal narrowing. IMPRESSION: 1. Variant anatomy with 6 non rib bearing segments of the lumbar spine. Considering the lowest rib-bearing segment as T12 and the first fully incorporated vertebral body within the sacrum as S1, the lowest non rib-bearing segments of the lumbar spine is considered L6 with partial sacralization on the right. 2. Interval development of a mild superior endplate fracture of L1 with minimal loss of height and no retropulsion. 3. Interval development of a superior endplate fracture of L3 with 10-20% loss of height and no retropulsion. 4. Stable superior endplate fractures of L4 and L5 with mild loss of height. 5. Multilevel degenerative disc disease and facet hypertrophy resulting in moderate to severe central canal stenosis at L4-5 with probable impingement of the crossing L5 nerve roots. Electronically Signed   By: Fidela Salisbury M.D.   On: 12/20/2021 01:23   CT Abdomen Pelvis W Contrast  Result Date: 12/20/2021 CLINICAL DATA:  Nausea/vomiting. Bowel obstruction suspected. Abdominal pain, acute, nonlocalized EXAM: CT ABDOMEN AND PELVIS WITH CONTRAST TECHNIQUE: Multidetector CT imaging of the abdomen and pelvis was performed using the standard protocol following bolus administration of intravenous contrast. RADIATION DOSE REDUCTION: This exam was performed according to the departmental dose-optimization  program which includes automated exposure control, adjustment of the mA and/or kV according to patient size and/or use of iterative reconstruction technique. CONTRAST:  170m OMNIPAQUE IOHEXOL 300 MG/ML  SOLN COMPARISON:  11/06/2021 FINDINGS: Lower chest: Moderate right pleural effusion with compressive atelectasis of the right lung base is unchanged. Stable eventration of the posterior left hemidiaphragm. Mild coronary artery calcification. Global cardiac size within normal limits. No pericardial effusion. Hepatobiliary: No focal liver abnormality is seen. No gallstones, gallbladder wall thickening, or biliary dilatation. Pancreas: Unremarkable Spleen: Unremarkable Adrenals/Urinary Tract: Adrenal glands are unremarkable. Kidneys are normal, without renal calculi, focal lesion,  or hydronephrosis. Bladder is unremarkable. Stomach/Bowel: Similar prior examination, there is mild fluid dilation of several loops of distal small bowel with fecalization of intraluminal contents in keeping with stasis with a focal point of transition noted within the right lower quadrant in keeping with changes of a partial small bowel obstruction. The degree of proximal bowel dilation appears improved since prior examination. Trace free fluid within the pelvis is unremarkable. No free intraperitoneal gas. The stomach, small bowel, and large bowel are otherwise unremarkable. Appendix normal. Vascular/Lymphatic: Enhancing, rounded right inguinal lymph node is stable measuring 15 mm in short axis diameter. No additional pathologic abdominal or pelvic adenopathy. Moderate aortoiliac atherosclerotic calcification. No aortic aneurysm. Reproductive: Uterus absent.  Pessary in place. Other: Peritoneal nodularity and nodular mesenteric implants are again identified scattered throughout the abdomen but best appreciated within the left lower quadrant. Additionally, soft tissue mass within the anterior peritoneum within the right lower quadrant, axial  image # 53/2 all appear unchanged in keeping with changes of metastatic ovarian cancer. No new intra-abdominal masses are identified. Musculoskeletal: The osseous structures are diffusely osteopenic. Superior endplate fractures of L3 and L4 are stable. New superior endplate fracture of F41 and L2 have developed, subacute in etiology with mild loss of height. No retropulsion. IMPRESSION: 1. Partial small bowel obstruction with point of transition within the right lower quadrant of the abdomen. The degree of proximal bowel dilation appears improved since prior examination. 2. Stable peritoneal nodularity and mesenteric implants in keeping with changes of metastatic ovarian cancer. Stable right inguinal adenopathy. 3. Stable moderate right pleural effusion. 4. Interval development of subacute superior endplate fractures of S23 and L2 with mild loss of height. No retropulsion. Stable superior endplate fractures of L3 and L4. Electronically Signed   By: Fidela Salisbury M.D.   On: 12/20/2021 01:09

## 2022-01-06 NOTE — Assessment & Plan Note (Signed)
She has severe back pain secondary to compression fracture She will continue pain medicine We discussed narcotic refill policy I explained to the patient and family why surgery is not indicated

## 2022-01-06 NOTE — Assessment & Plan Note (Signed)
We had numerous goals of care discussions in the past Her condition is not curable I estimated her prognosis is poor even if she were to attempt palliative chemotherapy It is not likely she will survive past 1 year, more than likely 6 to 9 months We discussed the role of palliative care and hospice She is undecided

## 2022-01-06 NOTE — Assessment & Plan Note (Signed)
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 Treatment options are limited We discussed the risk, benefits, side effects of gemcitabine and topotecan briefly, emphasizing side effects from the perspective of pancytopenia and low benefits expected I estimated the chances of her responding to either drug is 20% or less The expected benefit in general is in the form of disease control for 3 months at most before she would need another line of treatment Both treatment can cause severe pancytopenia that could increase her risk of complications and chemotherapy can potentially shorten her life At the end of the discussion, she is undecided She will discuss this further with her family and call me with final decision

## 2022-01-06 NOTE — Assessment & Plan Note (Signed)
She has intermittent small bowel obstruction secondary to adhesions as well as disease With aggressive dietary changes, she is able to eat frequent small meals but still have persistent nausea and pain She have lost some weight She will continue her modified diet as best as she could

## 2022-01-08 ENCOUNTER — Telehealth: Payer: Self-pay | Admitting: *Deleted

## 2022-01-08 NOTE — Telephone Encounter (Signed)
Pt called to make office aware that she has decided to stop all chemotherapy treatments. Advised pt that provider will be informed and will call pt for further recommendations. Pt verbalized understanding.

## 2022-01-09 ENCOUNTER — Telehealth: Payer: Self-pay | Admitting: Hematology and Oncology

## 2022-01-09 NOTE — Telephone Encounter (Signed)
.  Called patient to schedule appointment per 7/13 inbasket, patient is aware of date and time.   

## 2022-01-20 ENCOUNTER — Other Ambulatory Visit: Payer: Self-pay | Admitting: Hematology and Oncology

## 2022-01-23 DIAGNOSIS — I700 Atherosclerosis of aorta: Secondary | ICD-10-CM | POA: Diagnosis not present

## 2022-01-23 DIAGNOSIS — C800 Disseminated malignant neoplasm, unspecified: Secondary | ICD-10-CM | POA: Diagnosis not present

## 2022-01-23 DIAGNOSIS — K56609 Unspecified intestinal obstruction, unspecified as to partial versus complete obstruction: Secondary | ICD-10-CM | POA: Diagnosis not present

## 2022-01-26 ENCOUNTER — Other Ambulatory Visit: Payer: Self-pay | Admitting: Hematology and Oncology

## 2022-01-28 ENCOUNTER — Inpatient Hospital Stay (HOSPITAL_COMMUNITY)
Admission: EM | Admit: 2022-01-28 | Discharge: 2022-01-30 | DRG: 755 | Disposition: A | Discharge status: Hospice | Payer: Medicare Other | Attending: Internal Medicine | Admitting: Internal Medicine

## 2022-01-28 ENCOUNTER — Other Ambulatory Visit: Payer: Self-pay

## 2022-01-28 ENCOUNTER — Emergency Department (HOSPITAL_COMMUNITY): Payer: Medicare Other

## 2022-01-28 ENCOUNTER — Encounter (HOSPITAL_COMMUNITY): Payer: Self-pay

## 2022-01-28 ENCOUNTER — Telehealth: Payer: Self-pay | Admitting: *Deleted

## 2022-01-28 DIAGNOSIS — Z7901 Long term (current) use of anticoagulants: Secondary | ICD-10-CM | POA: Diagnosis not present

## 2022-01-28 DIAGNOSIS — J90 Pleural effusion, not elsewhere classified: Secondary | ICD-10-CM

## 2022-01-28 DIAGNOSIS — Z515 Encounter for palliative care: Secondary | ICD-10-CM

## 2022-01-28 DIAGNOSIS — Z66 Do not resuscitate: Secondary | ICD-10-CM | POA: Diagnosis not present

## 2022-01-28 DIAGNOSIS — N1832 Chronic kidney disease, stage 3b: Secondary | ICD-10-CM

## 2022-01-28 DIAGNOSIS — N183 Chronic kidney disease, stage 3 unspecified: Secondary | ICD-10-CM | POA: Diagnosis not present

## 2022-01-28 DIAGNOSIS — M8458XA Pathological fracture in neoplastic disease, other specified site, initial encounter for fracture: Secondary | ICD-10-CM | POA: Diagnosis present

## 2022-01-28 DIAGNOSIS — K219 Gastro-esophageal reflux disease without esophagitis: Secondary | ICD-10-CM | POA: Diagnosis present

## 2022-01-28 DIAGNOSIS — I129 Hypertensive chronic kidney disease with stage 1 through stage 4 chronic kidney disease, or unspecified chronic kidney disease: Secondary | ICD-10-CM | POA: Diagnosis not present

## 2022-01-28 DIAGNOSIS — Z7189 Other specified counseling: Secondary | ICD-10-CM | POA: Diagnosis not present

## 2022-01-28 DIAGNOSIS — I2699 Other pulmonary embolism without acute cor pulmonale: Secondary | ICD-10-CM | POA: Diagnosis not present

## 2022-01-28 DIAGNOSIS — Z6832 Body mass index (BMI) 32.0-32.9, adult: Secondary | ICD-10-CM

## 2022-01-28 DIAGNOSIS — E039 Hypothyroidism, unspecified: Secondary | ICD-10-CM | POA: Diagnosis not present

## 2022-01-28 DIAGNOSIS — D61818 Other pancytopenia: Secondary | ICD-10-CM | POA: Diagnosis present

## 2022-01-28 DIAGNOSIS — E669 Obesity, unspecified: Secondary | ICD-10-CM | POA: Diagnosis present

## 2022-01-28 DIAGNOSIS — E871 Hypo-osmolality and hyponatremia: Secondary | ICD-10-CM | POA: Diagnosis present

## 2022-01-28 DIAGNOSIS — R0602 Shortness of breath: Secondary | ICD-10-CM | POA: Diagnosis not present

## 2022-01-28 DIAGNOSIS — Z86711 Personal history of pulmonary embolism: Secondary | ICD-10-CM

## 2022-01-28 DIAGNOSIS — D509 Iron deficiency anemia, unspecified: Secondary | ICD-10-CM | POA: Diagnosis present

## 2022-01-28 DIAGNOSIS — T451X5A Adverse effect of antineoplastic and immunosuppressive drugs, initial encounter: Secondary | ICD-10-CM | POA: Diagnosis present

## 2022-01-28 DIAGNOSIS — Z95828 Presence of other vascular implants and grafts: Secondary | ICD-10-CM | POA: Diagnosis not present

## 2022-01-28 DIAGNOSIS — Z818 Family history of other mental and behavioral disorders: Secondary | ICD-10-CM

## 2022-01-28 DIAGNOSIS — J910 Malignant pleural effusion: Secondary | ICD-10-CM | POA: Diagnosis present

## 2022-01-28 DIAGNOSIS — R519 Headache, unspecified: Secondary | ICD-10-CM | POA: Diagnosis not present

## 2022-01-28 DIAGNOSIS — R0789 Other chest pain: Secondary | ICD-10-CM | POA: Diagnosis not present

## 2022-01-28 DIAGNOSIS — D696 Thrombocytopenia, unspecified: Secondary | ICD-10-CM | POA: Diagnosis present

## 2022-01-28 DIAGNOSIS — C561 Malignant neoplasm of right ovary: Secondary | ICD-10-CM | POA: Diagnosis not present

## 2022-01-28 DIAGNOSIS — G893 Neoplasm related pain (acute) (chronic): Secondary | ICD-10-CM | POA: Diagnosis not present

## 2022-01-28 DIAGNOSIS — I700 Atherosclerosis of aorta: Secondary | ICD-10-CM | POA: Diagnosis not present

## 2022-01-28 DIAGNOSIS — C799 Secondary malignant neoplasm of unspecified site: Secondary | ICD-10-CM | POA: Diagnosis present

## 2022-01-28 DIAGNOSIS — Z800 Family history of malignant neoplasm of digestive organs: Secondary | ICD-10-CM | POA: Diagnosis not present

## 2022-01-28 DIAGNOSIS — Z8673 Personal history of transient ischemic attack (TIA), and cerebral infarction without residual deficits: Secondary | ICD-10-CM

## 2022-01-28 DIAGNOSIS — R079 Chest pain, unspecified: Secondary | ICD-10-CM | POA: Diagnosis not present

## 2022-01-28 DIAGNOSIS — Z86718 Personal history of other venous thrombosis and embolism: Secondary | ICD-10-CM

## 2022-01-28 DIAGNOSIS — G620 Drug-induced polyneuropathy: Secondary | ICD-10-CM | POA: Diagnosis not present

## 2022-01-28 DIAGNOSIS — R109 Unspecified abdominal pain: Secondary | ICD-10-CM | POA: Diagnosis not present

## 2022-01-28 DIAGNOSIS — S22000A Wedge compression fracture of unspecified thoracic vertebra, initial encounter for closed fracture: Secondary | ICD-10-CM | POA: Diagnosis not present

## 2022-01-28 DIAGNOSIS — Z90710 Acquired absence of both cervix and uterus: Secondary | ICD-10-CM

## 2022-01-28 DIAGNOSIS — Z79899 Other long term (current) drug therapy: Secondary | ICD-10-CM

## 2022-01-28 DIAGNOSIS — R9389 Abnormal findings on diagnostic imaging of other specified body structures: Secondary | ICD-10-CM | POA: Diagnosis present

## 2022-01-28 DIAGNOSIS — I10 Essential (primary) hypertension: Secondary | ICD-10-CM | POA: Diagnosis not present

## 2022-01-28 DIAGNOSIS — C801 Malignant (primary) neoplasm, unspecified: Secondary | ICD-10-CM | POA: Diagnosis not present

## 2022-01-28 DIAGNOSIS — J9811 Atelectasis: Secondary | ICD-10-CM | POA: Diagnosis not present

## 2022-01-28 LAB — URINALYSIS, ROUTINE W REFLEX MICROSCOPIC
Bacteria, UA: NONE SEEN
Bilirubin Urine: NEGATIVE
Glucose, UA: NEGATIVE mg/dL
Hgb urine dipstick: NEGATIVE
Ketones, ur: 5 mg/dL — AB
Nitrite: NEGATIVE
Protein, ur: NEGATIVE mg/dL
Specific Gravity, Urine: 1.008 (ref 1.005–1.030)
pH: 8 (ref 5.0–8.0)

## 2022-01-28 LAB — CBC WITH DIFFERENTIAL/PLATELET
Abs Immature Granulocytes: 0 10*3/uL (ref 0.00–0.07)
Basophils Absolute: 0 10*3/uL (ref 0.0–0.1)
Basophils Relative: 1 %
Eosinophils Absolute: 0.1 10*3/uL (ref 0.0–0.5)
Eosinophils Relative: 1 %
HCT: 31.5 % — ABNORMAL LOW (ref 36.0–46.0)
Hemoglobin: 10.3 g/dL — ABNORMAL LOW (ref 12.0–15.0)
Immature Granulocytes: 0 %
Lymphocytes Relative: 33 %
Lymphs Abs: 1.5 10*3/uL (ref 0.7–4.0)
MCH: 35.3 pg — ABNORMAL HIGH (ref 26.0–34.0)
MCHC: 32.7 g/dL (ref 30.0–36.0)
MCV: 107.9 fL — ABNORMAL HIGH (ref 80.0–100.0)
Monocytes Absolute: 0.6 10*3/uL (ref 0.1–1.0)
Monocytes Relative: 14 %
Neutro Abs: 2.2 10*3/uL (ref 1.7–7.7)
Neutrophils Relative %: 51 %
Platelets: 196 10*3/uL (ref 150–400)
RBC: 2.92 MIL/uL — ABNORMAL LOW (ref 3.87–5.11)
RDW: 15 % (ref 11.5–15.5)
WBC: 4.4 10*3/uL (ref 4.0–10.5)
nRBC: 0 % (ref 0.0–0.2)

## 2022-01-28 LAB — VITAMIN B12: Vitamin B-12: 313 pg/mL (ref 180–914)

## 2022-01-28 LAB — BASIC METABOLIC PANEL
Anion gap: 12 (ref 5–15)
BUN: 13 mg/dL (ref 8–23)
CO2: 21 mmol/L — ABNORMAL LOW (ref 22–32)
Calcium: 10 mg/dL (ref 8.9–10.3)
Chloride: 101 mmol/L (ref 98–111)
Creatinine, Ser: 0.98 mg/dL (ref 0.44–1.00)
GFR, Estimated: 59 mL/min — ABNORMAL LOW (ref >60)
Glucose, Bld: 108 mg/dL — ABNORMAL HIGH (ref 70–99)
Potassium: 4.6 mmol/L (ref 3.5–5.1)
Sodium: 134 mmol/L — ABNORMAL LOW (ref 135–145)

## 2022-01-28 LAB — TSH: TSH: 1.737 u[IU]/mL (ref 0.350–4.500)

## 2022-01-28 LAB — BRAIN NATRIURETIC PEPTIDE: B Natriuretic Peptide: 42.1 pg/mL (ref 0.0–100.0)

## 2022-01-28 LAB — PROTIME-INR
INR: 1.4 — ABNORMAL HIGH (ref 0.8–1.2)
Prothrombin Time: 16.7 seconds — ABNORMAL HIGH (ref 11.4–15.2)

## 2022-01-28 LAB — APTT: aPTT: 37 seconds — ABNORMAL HIGH (ref 24–36)

## 2022-01-28 LAB — TROPONIN I (HIGH SENSITIVITY): Troponin I (High Sensitivity): 10 ng/L (ref <18)

## 2022-01-28 MED ORDER — ACETAMINOPHEN 325 MG PO TABS
650.0000 mg | ORAL_TABLET | Freq: Four times a day (QID) | ORAL | Status: DC | PRN
  Administered 2022-01-28 – 2022-01-30 (×2): 650 mg via ORAL
  Filled 2022-01-28 (×2): qty 2

## 2022-01-28 MED ORDER — OXYCODONE HCL 5 MG PO TABS
5.0000 mg | ORAL_TABLET | ORAL | Status: DC | PRN
  Administered 2022-01-28 – 2022-01-30 (×2): 5 mg via ORAL
  Filled 2022-01-28 (×2): qty 1

## 2022-01-28 MED ORDER — FENTANYL 12 MCG/HR TD PT72
1.0000 | MEDICATED_PATCH | TRANSDERMAL | Status: DC
  Administered 2022-01-28: 1 via TRANSDERMAL
  Filled 2022-01-28: qty 1

## 2022-01-28 MED ORDER — CHLORHEXIDINE GLUCONATE CLOTH 2 % EX PADS
6.0000 | MEDICATED_PAD | Freq: Every day | CUTANEOUS | Status: DC
  Administered 2022-01-28 – 2022-01-30 (×3): 6 via TOPICAL

## 2022-01-28 MED ORDER — IOHEXOL 350 MG/ML SOLN
100.0000 mL | Freq: Once | INTRAVENOUS | Status: AC | PRN
  Administered 2022-01-28: 100 mL via INTRAVENOUS

## 2022-01-28 MED ORDER — SODIUM CHLORIDE (PF) 0.9 % IJ SOLN
INTRAMUSCULAR | Status: AC
  Filled 2022-01-28: qty 50

## 2022-01-28 MED ORDER — SODIUM CHLORIDE 0.9% FLUSH: 10.0000 mL | INTRAVENOUS | Status: DC | PRN

## 2022-01-28 MED ORDER — SODIUM CHLORIDE 0.9% FLUSH
10.0000 mL | Freq: Two times a day (BID) | INTRAVENOUS | Status: DC
  Administered 2022-01-28 – 2022-01-29 (×2): 10 mL

## 2022-01-28 MED ORDER — GUAIFENESIN 100 MG/5ML PO LIQD: 5.0000 mL | ORAL | Status: DC | PRN

## 2022-01-28 MED ORDER — LORAZEPAM 2 MG/ML IJ SOLN
1.0000 mg | Freq: Once | INTRAMUSCULAR | Status: DC
  Filled 2022-01-28: qty 1

## 2022-01-28 MED ORDER — ONDANSETRON HCL 4 MG PO TABS: 4.0000 mg | ORAL_TABLET | Freq: Four times a day (QID) | ORAL | Status: DC | PRN

## 2022-01-28 MED ORDER — HYDROMORPHONE HCL 1 MG/ML IJ SOLN
0.5000 mg | Freq: Once | INTRAMUSCULAR | Status: AC
  Administered 2022-01-28: 0.5 mg via INTRAVENOUS
  Filled 2022-01-28: qty 1

## 2022-01-28 MED ORDER — LORAZEPAM 2 MG/ML IJ SOLN
0.5000 mg | Freq: Once | INTRAMUSCULAR | Status: AC
  Administered 2022-01-28: 0.5 mg via INTRAVENOUS

## 2022-01-28 MED ORDER — TRAZODONE HCL 100 MG PO TABS: 50.0000 mg | ORAL_TABLET | Freq: Every evening | ORAL | Status: DC | PRN

## 2022-01-28 MED ORDER — POLYETHYLENE GLYCOL 3350 17 G PO PACK
17.0000 g | PACK | Freq: Every day | ORAL | Status: DC
  Administered 2022-01-29: 17 g via ORAL
  Filled 2022-01-28 (×3): qty 1

## 2022-01-28 MED ORDER — ONDANSETRON HCL 4 MG/2ML IJ SOLN
4.0000 mg | Freq: Four times a day (QID) | INTRAMUSCULAR | Status: DC | PRN
  Administered 2022-01-29 (×2): 4 mg via INTRAVENOUS
  Filled 2022-01-28 (×2): qty 2

## 2022-01-28 MED ORDER — IPRATROPIUM-ALBUTEROL 0.5-2.5 (3) MG/3ML IN SOLN: 3.0000 mL | RESPIRATORY_TRACT | Status: DC | PRN

## 2022-01-28 MED ORDER — HYDRALAZINE HCL 20 MG/ML IJ SOLN: 10.0000 mg | INTRAMUSCULAR | Status: DC | PRN

## 2022-01-28 MED ORDER — BISACODYL 5 MG PO TBEC
5.0000 mg | DELAYED_RELEASE_TABLET | Freq: Every day | ORAL | Status: DC | PRN
  Administered 2022-01-29 – 2022-01-30 (×2): 5 mg via ORAL
  Filled 2022-01-28 (×2): qty 1

## 2022-01-28 MED ORDER — LEVOTHYROXINE SODIUM 50 MCG PO TABS
75.0000 ug | ORAL_TABLET | Freq: Every day | ORAL | Status: DC
  Administered 2022-01-29 – 2022-01-30 (×2): 75 ug via ORAL
  Filled 2022-01-28 (×2): qty 1

## 2022-01-28 MED ORDER — METOPROLOL TARTRATE 5 MG/5ML IV SOLN: 5.0000 mg | INTRAVENOUS | Status: DC | PRN

## 2022-01-28 MED ORDER — DOCUSATE SODIUM 100 MG PO CAPS
100.0000 mg | ORAL_CAPSULE | Freq: Two times a day (BID) | ORAL | Status: DC
  Administered 2022-01-28 – 2022-01-30 (×4): 100 mg via ORAL
  Filled 2022-01-28 (×4): qty 1

## 2022-01-28 MED ORDER — HYDROMORPHONE HCL 1 MG/ML IJ SOLN
0.5000 mg | INTRAMUSCULAR | Status: DC | PRN
  Administered 2022-01-28 – 2022-01-29 (×4): 1 mg via INTRAVENOUS
  Filled 2022-01-28 (×4): qty 1

## 2022-01-28 NOTE — Telephone Encounter (Signed)
Daughter Thalia Bloodgood called office - LVM: Ms. Jerl Mina states her mother had recently informed Dr. Alvy Bimler that she was stopping all chemotherapy. Her mother told them she's finding it harder to breathe over past few days. Daughter states no wheezing or coughing.  Patient's back pain has increased, even with fentanyl patch (which was changed yesterday). She is mostly sedentary, staying in recliner and getting up only to go to bathroom or get food. She's been trying to eat some, but her appetite is not great due to bowel obstruction. She told family for past several days that she just feels bad. Ms. Jerl Mina states they are seeking MD guidance.    Contacted Ms. Rakestraw in response to her message. Daughter stated that in the time since she left message, family decided patient should go to ED for evaluation. Patient's daughter Delman Kitten is taking her to [facility] ED.    Informed her that Dr. Alvy Bimler out of office at this time and will be sent this message/information. Advised her that should ED or hospital services need oncology input for patient's care, the on call oncologist from [place] will be contacted. Ms. Jerl Mina stated appreciation for call and support of oncology services.

## 2022-01-28 NOTE — H&P (Addendum)
History and Physical    Paula Peterson IOE:[id number] DOB: 06-25-1941 DOA: 01/28/2022  PCP: Caryl Bis, MD Patient coming from: Home  Chief Complaint: Chest pain  HPI: Paula Peterson is a 81 y.o. female with medical history significant of right-sided ovarian cancer stopped chemotherapy 2 months ago, PE/DVT on Eliquis, HTN, recurrent SBO, hypothyroidism, chronic compression fracture comes to the hospital for evaluation of chest discomfort and dyspnea on exertion.  Patient states she took her last chemotherapy on 6/12 and since then has been off of that due to overall poor prognosis, follows outpatient oncology- Dr Alvy Bimler. Over the past 3-4 days patient has had progressive dyspnea on exertion and tightness like chest discomfort accompanied by feeling of lightheadedness and dizziness at times.  Denies any fevers, chills, radiation of pain.  At times does have orthopnea.  Denies any sick contacts. Due to the symptoms she called her oncologist who advised her to come to the ER for further management.  In the ED patient was found to have large right-sided pleural effusion with some mediastinal shift.  She was saturating 100% on room air but was slightly tachypneic.  CTA chest was also negative for PE.  CT abdomen pelvis showed progression of her ovarian malignancy.  CT of the head was also negative.  Social history-denies any alcohol, tobacco and illicit drug use CODE STATUS-currently wishes to be full but open to discussion regarding DNR/DNI depending on her hospital course.   Review of Systems: As per HPI otherwise 10 point review of systems negative.  Review of Systems Otherwise negative except as per HPI, including: General: Denies fever, chills, night sweats or unintended weight loss. Resp: Denies cough, wheezing, shortness of breath. Cardiac: Denies chest pain, palpitations, orthopnea, paroxysmal nocturnal dyspnea. GI: Denies abdominal pain, nausea, vomiting, diarrhea or constipation GU:  Denies dysuria, frequency, hesitancy or incontinence MS: Denies muscle aches, joint pain or swelling Neuro: Denies headache, neurologic deficits (focal weakness, numbness, tingling), abnormal gait Psych: Denies anxiety, depression, SI/HI/AVH Skin: Denies new rashes or lesions ID: Denies sick contacts, exotic exposures, travel  Past Medical History:  Diagnosis Date   Back pain    DVT (deep venous thrombosis) ([place]) 02/02/2019   had DVT first that moved into lung   GERD (gastroesophageal reflux disease)    History of pulmonary embolus (PE) 02/02/2019   confirmed by CT chest- had DVT first that moved into lungs per patient   Hypertension    Ovarian cancer ([place])    Pessary maintenance    Stroke [facility])     Past Surgical History:  Procedure Laterality Date   ABDOMINAL HYSTERECTOMY     ovaries left   BACK SURGERY     DEBULKING Bilateral 06/06/2019   Procedure: RADICAL TUMOR DEBULKING;  Surgeon: Everitt Amber, MD;  Location: [facility];  Service: Gynecology;  Laterality: Bilateral;   FRACTURE SURGERY     IR IMAGING GUIDED PORT INSERTION  12/15/2018   OMENTECTOMY Bilateral 06/06/2019   Procedure: OMENTECTOMY;  Surgeon: Everitt Amber, MD;  Location: [facility];  Service: Gynecology;  Laterality: Bilateral;   SALPINGOOPHORECTOMY Bilateral 06/06/2019   Procedure: EXPLORATORY LAPAROTOMY,OPEN RIGHT SALPINGO OOPHORECTOMY;  Surgeon: Everitt Amber, MD;  Location: [facility];  Service: Gynecology;  Laterality: Bilateral;    SOCIAL HISTORY:  reports that she has never smoked. She has never used smokeless tobacco. She reports that she does not drink alcohol and does not use drugs.  No Known Allergies  FAMILY HISTORY: Family History  Problem Relation Age of Onset  Stomach cancer Sister        dx 102s-50s   Dementia Mother    Cancer Sister        unk type, hysterectomy, dx 75s     Prior to Admission medications   Medication Sig Start Date End Date Taking? Authorizing Provider  acetaminophen (TYLENOL) 500 MG  tablet Take 1,000 mg by mouth every 6 (six) hours as needed for mild pain or fever.    [provider]  anti-nausea (EMETROL) solution Take 10 mLs by mouth every 15 (fifteen) minutes as needed for nausea or vomiting.    [provider]  apixaban (ELIQUIS) 5 MG TABS tablet Take 1 tablet (5 mg total) by mouth 2 (two) times daily. 10/01/21   [name], MD  atenolol (TENORMIN) 25 MG tablet TAKE ONE (1) TABLET BY MOUTH EVERY DAY 01/26/22   [name], MD  diclofenac Sodium (VOLTAREN) 1 % GEL Apply 1 application. topically daily as needed (arthritis pain).    [provider]  docusate sodium (COLACE) 100 MG capsule Take 1 capsule (100 mg total) by mouth 2 (two) times daily. 12/24/21   Regalado, Belkys A, MD  fentaNYL (DURAGESIC) 12 MCG/HR Place 1 patch onto the skin every 3 (three) days. 12/24/21   Regalado, Jerald Kief A, MD  levothyroxine (SYNTHROID) 75 MCG tablet Take 1 tablet (75 mcg total) by mouth daily before breakfast. 07/18/20   Denton Brick, Courage, MD  lidocaine-prilocaine (EMLA) cream Apply 1 application topically daily as needed (port access). 07/22/21   [name], MD  Magnesium Hydroxide (DULCOLAX SOFT CHEWS PO) Take 2 each by mouth daily as needed (constipation).    [provider]  mometasone (NASONEX) 50 MCG/ACT nasal spray Place 2 sprays into the nose daily as needed (allergies).    [provider]  ondansetron (ZOFRAN) 8 MG tablet TAKE 1 TABLET BY MOUTH TWICE DAILY AS NEEDED FOR NAUSEA & VOMITING Patient taking differently: Take 8 mg by mouth 2 (two) times daily as needed for nausea or vomiting. 11/18/21   Alvy Bimler, Ni, MD  oxyCODONE (OXY IR/ROXICODONE) 5 MG immediate release tablet TAKE 1 TABLET BY MOUTH EVERY 4 HOURS AS NEEDED FOR SEVERE PAIN 01/20/22   [name], MD  polyethylene glycol (MIRALAX / GLYCOLAX) 17 g packet Take 17 g by mouth daily. 12/24/21   Elmarie Shiley, MD    Physical Exam: Vitals:   01/28/22 1530 01/28/22 1625 01/28/22 1630  01/28/22 1705  BP: 103/86 (!) 170/78 (!) 177/87 (!) 146/63  Pulse: 67 70 72 71  Resp: (!) 25 (!) 25 20 (!) 24  Temp:    (!) 97.4 F (36.3 C)  TempSrc:    Oral  SpO2: 99% 96% 100% 95%  Weight:      Height:          Constitutional: Slight discomfort due to pain Eyes: PERRL, lids and conjunctivae normal ENMT: Mucous membranes are moist. Posterior pharynx clear of any exudate or lesions.Normal dentition.  Neck: normal, supple, no masses, no thyromegaly Respiratory: Tachypnea with respiration rate  25; diminished breath sounds on the right side midway up the lung field. Cardiovascular: Regular rate and rhythm, no murmurs / rubs / gallops. No extremity edema. 2+ pedal pulses. No carotid bruits.  Abdomen: no tenderness, no masses palpated. No hepatosplenomegaly. Bowel sounds positive.  Musculoskeletal: no clubbing / cyanosis. No joint deformity upper and lower extremities. Good ROM, no contractures. Normal muscle tone.  Skin: no rashes, lesions, ulcers. No induration Neurologic: CN 2-12 grossly intact. Sensation  intact, DTR normal. Strength 5/5 in all 4.  Psychiatric: Normal judgment and insight. Alert and oriented x 3. Normal mood.     Labs on Admission: I have personally reviewed following labs and imaging studies  CBC: Recent Labs  Lab 01/28/22 1655  WBC 4.4  NEUTROABS 2.2  HGB 10.3*  HCT 31.5*  MCV 107.9*  PLT 161   Basic Metabolic Panel: Recent Labs  Lab 01/28/22 1404  NA 134*  K 4.6  CL 101  CO2 21*  GLUCOSE 108*  BUN 13  CREATININE 0.98  CALCIUM 10.0   GFR: Estimated Creatinine Clearance: 56.1 mL/min (by C-G formula based on SCr of 0.98 mg/dL). Liver Function Tests: No results for input(s): "AST", "ALT", "ALKPHOS", "BILITOT", "PROT", "ALBUMIN" in the last 168 hours. No results for input(s): "LIPASE", "AMYLASE" in the last 168 hours. No results for input(s): "AMMONIA" in the last 168 hours. Coagulation Profile: No results for input(s): "INR", "PROTIME" in  the last 168 hours. Cardiac Enzymes: No results for input(s): "CKTOTAL", "CKMB", "CKMBINDEX", "TROPONINI" in the last 168 hours. BNP (last 3 results) No results for input(s): "PROBNP" in the last 8760 hours. HbA1C: No results for input(s): "HGBA1C" in the last 72 hours. CBG: No results for input(s): "GLUCAP" in the last 168 hours. Lipid Profile: No results for input(s): "CHOL", "HDL", "LDLCALC", "TRIG", "CHOLHDL", "LDLDIRECT" in the last 72 hours. Thyroid Function Tests: No results for input(s): "TSH", "T4TOTAL", "FREET4", "T3FREE", "THYROIDAB" in the last 72 hours. Anemia Panel: No results for input(s): "VITAMINB12", "FOLATE", "FERRITIN", "TIBC", "IRON", "RETICCTPCT" in the last 72 hours. Urine analysis:    Component Value Date/Time   COLORURINE STRAW (A) 01/28/2022 1423   APPEARANCEUR CLEAR 01/28/2022 1423   LABSPEC 1.008 01/28/2022 1423   PHURINE 8.0 01/28/2022 1423   GLUCOSEU NEGATIVE 01/28/2022 1423   HGBUR NEGATIVE 01/28/2022 1423   BILIRUBINUR NEGATIVE 01/28/2022 1423   KETONESUR 5 (A) 01/28/2022 1423   PROTEINUR NEGATIVE 01/28/2022 1423   UROBILINOGEN 0.2 04/08/2013 2040   NITRITE NEGATIVE 01/28/2022 1423   LEUKOCYTESUR TRACE (A) 01/28/2022 1423   Sepsis Labs: !!!!!!!!!!!!!!!!!!!!!!!!!!!!!!!!!!!!!!!!!!!! '@LABRCNTIP'$ (procalcitonin:4,lacticidven:4) )No results found for this or any previous visit (from the past 240 hour(s)).   Radiological Exams on Admission: CT ABDOMEN PELVIS W CONTRAST  Result Date: 01/28/2022 CLINICAL DATA:  Acute nonlocalized abdominal pain EXAM: CT ABDOMEN AND PELVIS WITH CONTRAST TECHNIQUE: Multidetector CT imaging of the abdomen and pelvis was performed using the standard protocol following bolus administration of intravenous contrast. RADIATION DOSE REDUCTION: This exam was performed according to the departmental dose-optimization program which includes automated exposure control, adjustment of the mA and/or kV according to patient size and/or use of  iterative reconstruction technique. CONTRAST:  157m OMNIPAQUE IOHEXOL 350 MG/ML SOLN COMPARISON:  Radiographs 12/22/2021 and CT 12/20/2021 FINDINGS: Lower chest: Right pleural effusion and associated atelectasis/consolidation. See separate report from same day CTA chest for additional findings in the chest. Hepatobiliary: Multifocal gallbladder wall thickening and nodularity with nodules measuring up to 8 mm in diameter. For example, series 2/image 42 and series 2/image 40. No focal hepatic lesion. No biliary ductal dilation. Pancreas: Unremarkable. No pancreatic ductal dilatation or surrounding inflammatory changes. Spleen: Normal in size without focal abnormality. Adrenals/Urinary Tract: Adrenal glands are unremarkable. Kidneys are normal, without renal calculi, focal lesion, or hydronephrosis. Bladder is unremarkable. Stomach/Bowel: Stomach is within normal limits. No evidence of bowel wall thickening, distention, or inflammatory changes. The previous dilation of several loops of distal small bowel has. Vascular/Lymphatic: Aortic atherosclerosis. Enhancing rounded right inguinal lymph  node is unchanged measuring 15 mm in diameter. Moderate aortoiliac atherosclerotic calcification. No aneurysm. Reproductive: Hysterectomy.  Pessary. Other: Peritoneal nodularity and nodular mesenteric implants scattered throughout the abdomen consistent with metastatic ovarian cancer have increased since 12/20/2021. For example, the soft tissue mass within the anterior peritoneum within the right lower has increased in size now measuring approximally 4.3 x 2.2 cm in the axial plane, previously 4.0 x 2.2 cm. This is intimately associated with the adjacent colon which may be tethered at this location (series 2/image 51). Musculoskeletal: Increased height loss of the T12 vertebral body (approximately 25%) since 12/20/2021. No retropulsion. Unchanged superior endplate fractures of L2, L3, and L4. IMPRESSION: 1. Increased peritoneal  nodularity and mesenteric implants consistent with worsening metastatic ovarian cancer. This includes increased size of the soft tissue mass within the anterior right peritoneum abutting the colon which may be tethered at this point. No evidence of bowel obstruction. 2. Multifocal mural thickening and nodularity in the gallbladder concerning for metastases. 3. Increased height loss of the superior endplate fracture of E08. Unchanged superior endplate fractures of L2, L3, and L4. 4. Moderate right pleural effusion. See same day CT chest report for further details of the findings in the chest. 5.  Aortic Atherosclerosis (ICD10-I70.0). Electronically Signed   By: Placido Sou M.D.   On: 01/28/2022 16:50   CT Angio Chest PE W and/or Wo Contrast  Result Date: 01/28/2022 CLINICAL DATA:  Shortness of breath and chest tightness. History of ovarian cancer. EXAM: CT ANGIOGRAPHY CHEST WITH CONTRAST TECHNIQUE: Multidetector CT imaging of the chest was performed using the standard protocol during bolus administration of intravenous contrast. Multiplanar CT image reconstructions and MIPs were obtained to evaluate the vascular anatomy. RADIATION DOSE REDUCTION: This exam was performed according to the departmental dose-optimization program which includes automated exposure control, adjustment of the mA and/or kV according to patient size and/or use of iterative reconstruction technique. CONTRAST:  126m OMNIPAQUE IOHEXOL 350 MG/ML SOLN COMPARISON:  Chest CT 12/19/2020 FINDINGS: Cardiovascular: Negative for pulmonary embolism. Heart size is normal. Normal caliber of the thoracic aorta. Atherosclerotic calcifications at the aortic arch. Right jugular Port-A-Cath tip is near the superior cavoatrial junction. Mediastinum/Nodes: Heart and mediastinum are slightly displaced toward the left due to large right pleural effusion. Precarinal lymph node measures 8 mm in short axis and previously measured 5 mm. Overall, there is not  significant lymph node enlargement in the mediastinum or hilar regions. No axillary lymph node enlargement. Lungs/Pleura: New large right pleural effusion causing near complete collapse of the right lower lobe. Minimal aeration in the right lower lobe. Significant volume loss in the right middle lobe. 4 mm calcified granuloma in the right upper lobe on sequence 6, image 42. Mild interlobular thickening near the right lung apex. Increased volume loss and atelectasis at the left lung base. Stable scarring at the left lung apex. Tiny nodular densities in the left lung are similar to the recent comparison examination examination. Upper Abdomen: Again noted are left renal parapelvic cysts. No acute abnormality of the visualized upper abdomen. Musculoskeletal: New compression fracture along the superior endplate of L1 with mild vertebral body height loss. There is no significant bone retropulsion at this fracture. Review of the MIP images confirms the above findings. IMPRESSION: 1. Large right pleural effusion with near complete collapse of the right lower lobe and significant volume loss in the right middle lobe. Mediastinal shift towards the left due to the large pleural effusion. 2. New compression fracture involving the superior  endplate of L1. 3. Negative for a pulmonary embolism. Electronically Signed   By: Markus Daft M.D.   On: 01/28/2022 16:34   CT Head Wo Contrast  Result Date: 01/28/2022 CLINICAL DATA:  Headache, sudden, severe EXAM: CT HEAD WITHOUT CONTRAST TECHNIQUE: Contiguous axial images were obtained from the base of the skull through the vertex without intravenous contrast. RADIATION DOSE REDUCTION: This exam was performed according to the departmental dose-optimization program which includes automated exposure control, adjustment of the mA and/or kV according to patient size and/or use of iterative reconstruction technique. COMPARISON:  10/16/2020 FINDINGS: Brain: No evidence of acute infarction,  hemorrhage, mass, mass effect, or midline shift. No hydrocephalus or extra-axial fluid collection. Periventricular white matter changes, likely the sequela of chronic small vessel ischemic disease. Vascular: No hyperdense vessel. Skull: Normal. Negative for fracture or focal lesion. Sinuses/Orbits: No acute finding. Other: The mastoid air cells are well aerated. IMPRESSION: No acute intracranial process. Electronically Signed   By: Merilyn Baba M.D.   On: 01/28/2022 16:20   DG Chest 2 View  Result Date: 01/28/2022 CLINICAL DATA:  Short of breath EXAM: CHEST - 2 VIEW COMPARISON:  September 2020 FINDINGS: Right chest wall port with catheter overlying the SVC. Right pleural effusion with adjacent atelectasis. Eventration of the left hemidiaphragm. Cardiomediastinal contours are partially obscured. No pneumothorax. IMPRESSION: Moderate right pleural effusion with adjacent atelectasis. Electronically Signed   By: Macy Mis M.D.   On: 01/28/2022 14:07     All images have been reviewed by me personally.  EKG: Independently reviewed. No acute ST T changes.   Assessment/Plan Principal Problem:   Chest pain Active Problems:   Right ovarian epithelial cancer (HCC)   Large pleural effusion   Acquired hypothyroidism   PE (pulmonary thromboembolism) ([facility])   Pancytopenia, acquired (HCC)   Peripheral neuropathy due to chemotherapy ([place])   Pulmonary embolism (HCC)   Essential hypertension   CKD (chronic kidney disease), stage III (HCC)   Thrombocytopenia (H[facility])   Compression fracture of body of thoracic vertebra (HCC)   Pleural effusion    Atypical chest pain with large right-sided pleural effusion and mediastinal shift -I suspect this is malignant effusion.  We will admit the patient.  Pulmonary team has been consulted.  Patient will need thoracentesis with likely Pleurx catheter placement.  We will defer the timing and further management to pulmonary team.  Eliquis is currently being held.  Patient  is not hypoxic but has atypical chest pain and dyspnea on exertion.  EKG is nonischemic. Echocardiogram in April showed EF of 55% with grade 1 DD. BNP normal today  Acute on chronic compression fracture -Off for pain control.  Bowel regimen.  Right ovarian epithelial cancer -CT of the abdomen pelvis today shows worsening of metastatic ovarian cancer in the abdomen.  Patient follows Dr. Alvy Bimler outpatient, stopped her chemotherapy 2 months ago.  History of pulmonary embolism -On home Eliquis which is currently on hold  CKD stage II -Stable  Chronic microcytic anemia - Stable.  We will check TSH, folate and B12.  History of recurrent SBO -Currently no evidence of obstruction but does have progression of malignancy.  Hypothyroidism - Synthroid  Goals of care discussion - Patient does have advanced malignancy.  I discussed goals of care and length with the patient and the daughter at bedside.  They are wanting some time to decide complete hospice care.  In the meantime we will consult palliative care to help with pain management and goals of care.  DVT prophylaxis: On Eliquis currently on hold in anticipation for thoracentesis Code Status: Full code for now but patient to discuss DNR/DNI status Family Communication: Daughter at bedside Consults called: Pulmonary Admission status: Inpatient progressive care  Status is: Inpatient Remains inpatient appropriate because: Has large pleural effusion with mediastinal shift.   Time Spent: 65 minutes.  >50% of the time was devoted to discussing the patients care, assessment, plan and disposition with other care givers along with counseling the patient about the risks and benefits of treatment.    Kymorah Korf Arsenio Loader MD [facility]  If 7PM-7AM, please contact night-coverage   01/28/2022, 5:32 PM

## 2022-01-28 NOTE — ED Triage Notes (Addendum)
Pt c/o sob and chest tightness over the past couple of days. Hx of PE. States she has not missed any doses of her eliquis.

## 2022-01-28 NOTE — ED Notes (Signed)
Patient still has her fentanyl patch in place on lower back. It is covered with bandaid

## 2022-01-28 NOTE — ED Provider Triage Note (Signed)
Emergency Medicine Provider Triage Evaluation Note  Paula Peterson , a 81 y.o. female  was evaluated in triage.  Pt complains of sob. Hx of terminal ovarian cancer.  Has progressive worsening sob x 3 days.  No fever or cough.  Is on Eliquis for hx of PE and have not missed any dose.    Review of Systems  Positive: As above Negative: As above  Physical Exam  BP (!) 153/80   Pulse 73   Temp (!) 97.5 F (36.4 C)   Resp (!) 23   Ht '5\' 7"'$  (1.702 m)   Wt 95.3 kg   SpO2 100%   BMI 32.89 kg/m  Gen:   Awake, tachypneic Resp:  Normal effort  MSK:   Moves extremities without difficulty  Other:    Medical Decision Making  Medically screening exam initiated at 1:32 PM.  Appropriate orders placed.  Paula Peterson was informed that the remainder of the evaluation will be completed by another provider, this initial triage assessment does not replace that evaluation, and the importance of remaining in the ED until their evaluation is complete.     Domenic Moras, PA-C 01/28/22 1337

## 2022-01-28 NOTE — Consult Note (Signed)
NAME:  Paula Peterson, MRN:  [id number], DOB:  11-11-1940, LOS: 0 ADMISSION DATE:  01/28/2022, CONSULTATION DATE: 8/2 REFERRING MD: Dr. Rogene Houston, CHIEF COMPLAINT:  SOB   History of Present Illness:  81 y/o F who presented to [facility] ER on 8/2 with reports of increasing shortness of breath and chest tightness x2 days.    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 Alvy Bimler that has been progressive despite therapy. The patient was most recently seen 7/11 by Dr. Alvy Bimler.  At that time, disease burden by CT was not controlled and she was offered a therapy change with expected benefit of ~3 months of of control. The patient elected at that time to stop all therapy.  CT also showed intermittent small bowel obstruction in setting of adhesions and disease burden.  She has a history of PE on eliquis.  She reported she has not missed any of her eliquis doses.  On last review with Dr. Alvy Bimler, the patient was undecided regarding palliative care.    Initial ER evaluation notable for findings of large right pleural effusion, pancytopenia and mild hyponatremia.  Sh reports "pain all over that is 10/10".  Patient states her headache is the most uncomfortable at this point.  She took tylenol early am and it did not relieve it. Her last dose of eliquis was am 8/2 around 0700. Denies falls.  CT head negative. CT angio of chest was negative for PE but showed large right pleural effusion.  CT ABD/Pelvis w contrast showed increased peritoneal nodularity & mesenteric implants consistent with worsening metastatic ovarian cancer.   PCCM consulted for evaluation of pleural effusion.   Pertinent  Medical History  Ovarian Epithelial Cancer (on right) - followed by Dr. Alvy Bimler  PE / DVT - 2020  Pleural Effusion - s/p thora (remote) GERD  CVA Chronic Back Pain  HTN Abdominal Hysterectomy, Debulking, Omentectomy  Significant Hospital Events: Including procedures, antibiotic start and stop  dates in addition to other pertinent events   8/2 Admit with increasing SOB, chest tightness.  Increased pleural effusion, PCCM consulted.   Interim History / Subjective:  As above   Objective   Blood pressure (!) 158/84, pulse 72, temperature (!) 97.4 F (36.3 C), temperature source Oral, resp. rate (!) 25, height '5\' 7"'$  (1.702 m), weight 95.3 kg, SpO2 98 %.       No intake or output data in the 24 hours ending 01/28/22 1754 Filed Weights   01/28/22 1326  Weight: 95.3 kg    Examination: General: elderly adult female lying in bed in NAD on RA HENT: MM pink/dry, no JVD, anicteric Lungs: non-labored at rest, clear anterior, diminished on R Cardiovascular: s1s2 RRR, no m/r/g Abdomen: soft, non-tender, bsx4 active  Extremities: warm/dry, no edema  Neuro: AAOx4, speech clear, MAE GU: purewick in place  Resolved Hospital Problem list     Assessment & Plan:   Malignant Right Pleural Effusion  Hx PE / DVT  Right pleural effusion, increased in size. Suspect malignant in nature. Previously tapped early in her diagnosis of ovarian cancer (11/2018 pleural fluid consistent with metastatic adenocarcinoma).  -hold eliquis for wash out  -plan for thoracentesis on 8/3  -send pleural fluid for analysis  -would need to see how quickly fluid re-accumulates before placing PleurX catheter -follow intermittent CXR  -pulmonary hygiene -IS, mobilize   Ovarian Epithelial Cancer (on right) Cancer Related Pain  -pain control per primary, consider increase in baseline needs given progression  -  bowel regimen with narcotics  -Palliative Care consultation for goals of care  Best Practice (right click and "Reselect all SmartList Selections" daily)  Per [facility]   Labs   CBC: Recent Labs  Lab 01/28/22 1655  WBC 4.4  NEUTROABS 2.2  HGB 10.3*  HCT 31.5*  MCV 107.9*  PLT 573    Basic Metabolic Panel: Recent Labs  Lab 01/28/22 1404  NA 134*  K 4.6  CL 101  CO2 21*  GLUCOSE 108*  BUN 13   CREATININE 0.98  CALCIUM 10.0   GFR: Estimated Creatinine Clearance: 56.1 mL/min (by C-G formula based on SCr of 0.98 mg/dL). Recent Labs  Lab 01/28/22 1655  WBC 4.4    Liver Function Tests: No results for input(s): "AST", "ALT", "ALKPHOS", "BILITOT", "PROT", "ALBUMIN" in the last 168 hours. No results for input(s): "LIPASE", "AMYLASE" in the last 168 hours. No results for input(s): "AMMONIA" in the last 168 hours.  ABG No results found for: "PHART", "PCO2ART", "PO2ART", "HCO3", "TCO2", "ACIDBASEDEF", "O2SAT"   Coagulation Profile: Recent Labs  Lab 01/28/22 1655  INR 1.4*    Cardiac Enzymes: No results for input(s): "CKTOTAL", "CKMB", "CKMBINDEX", "TROPONINI" in the last 168 hours.  HbA1C: No results found for: "HGBA1C"  CBG: No results for input(s): "GLUCAP" in the last 168 hours.  Review of Systems: Positives in [name]  Gen: Denies fever, chills, weight change, fatigue, night sweats HEENT: Denies blurred vision, double vision, hearing loss, tinnitus, sinus congestion, rhinorrhea, sore throat, neck stiffness, dysphagia PULM: Denies shortness of breath, chest tightness, cough, sputum production, hemoptysis, wheezing CV: Denies chest pain, edema, orthopnea, paroxysmal nocturnal dyspnea, palpitations GI: Denies abdominal pain, nausea, vomiting, diarrhea, hematochezia, melena, constipation, change in bowel habits GU: Denies dysuria, hematuria, polyuria, oliguria, urethral discharge Endocrine: Denies hot or cold intolerance, polyuria, polyphagia or appetite change Derm: Denies rash, dry skin, scaling or peeling skin change Heme: Denies easy bruising, bleeding, bleeding gums Neuro: Denies headache, numbness, weakness, slurred speech, loss of memory or consciousness  Past Medical History:  She,  has a past medical history of Back pain, DVT (deep venous thrombosis) ([place]) (02/02/2019), GERD (gastroesophageal reflux disease), History of pulmonary embolus (PE) (02/02/2019),  Hypertension, Ovarian cancer ([place]), Pessary maintenance, and Stroke ([place]).   Surgical History:   Past Surgical History:  Procedure Laterality Date   ABDOMINAL HYSTERECTOMY     ovaries left   BACK SURGERY     DEBULKING Bilateral 06/06/2019   Procedure: RADICAL TUMOR DEBULKING;  Surgeon: Everitt Amber, MD;  Location: [facility];  Service: Gynecology;  Laterality: Bilateral;   FRACTURE SURGERY     IR IMAGING GUIDED PORT INSERTION  12/15/2018   OMENTECTOMY Bilateral 06/06/2019   Procedure: OMENTECTOMY;  Surgeon: Everitt Amber, MD;  Location: [facility];  Service: Gynecology;  Laterality: Bilateral;   SALPINGOOPHORECTOMY Bilateral 06/06/2019   Procedure: EXPLORATORY LAPAROTOMY,OPEN RIGHT SALPINGO OOPHORECTOMY;  Surgeon: Everitt Amber, MD;  Location: [facility];  Service: Gynecology;  Laterality: Bilateral;     Social History:   reports that she has never smoked. She has never used smokeless tobacco. She reports that she does not drink alcohol and does not use drugs.   Family History:  Her family history includes Cancer in her sister; Dementia in her mother; Stomach cancer in her sister.   Allergies No Known Allergies   Home Medications  Prior to Admission medications   Medication Sig Start Date End Date Taking? Authorizing Provider  acetaminophen (TYLENOL) 500 MG tablet Take 1,000 mg by mouth every 6 (six)  hours as needed for mild pain or fever.    [provider]  anti-nausea (EMETROL) solution Take 10 mLs by mouth every 15 (fifteen) minutes as needed for nausea or vomiting.    [provider]  apixaban (ELIQUIS) 5 MG TABS tablet Take 1 tablet (5 mg total) by mouth 2 (two) times daily. 10/01/21   [name], MD  atenolol (TENORMIN) 25 MG tablet TAKE ONE (1) TABLET BY MOUTH EVERY DAY 01/26/22   [name], MD  diclofenac Sodium (VOLTAREN) 1 % GEL Apply 1 application. topically daily as needed (arthritis pain).    [provider]  docusate sodium (COLACE) 100 MG capsule Take 1  capsule (100 mg total) by mouth 2 (two) times daily. 12/24/21   Regalado, Belkys A, MD  fentaNYL (DURAGESIC) 12 MCG/HR Place 1 patch onto the skin every 3 (three) days. 12/24/21   Regalado, Jerald Kief A, MD  levothyroxine (SYNTHROID) 75 MCG tablet Take 1 tablet (75 mcg total) by mouth daily before breakfast. 07/18/20   [place]enton Brick, Courage, MD  lidocaine-prilocaine (EMLA) cream Apply 1 application topically daily as needed (port access). 07/22/21   [name], MD  Magnesium Hydroxide (DULCOLAX SOFT CHEWS PO) Take 2 each by mouth daily as needed (constipation).    [provider]  mometasone (NASONEX) 50 MCG/ACT nasal spray Place 2 sprays into the nose daily as needed (allergies).    [provider]  ondansetron (ZOFRAN) 8 MG tablet TAKE 1 TABLET BY MOUTH TWICE DAILY AS NEEDED FOR NAUSEA & VOMITING Patient taking differently: Take 8 mg by mouth 2 (two) times daily as needed for nausea or vomiting. 11/18/21   Alvy Bimler, Ni, MD  oxyCODONE (OXY IR/ROXICODONE) 5 MG immediate release tablet TAKE 1 TABLET BY MOUTH EVERY 4 HOURS AS NEEDED FOR SEVERE PAIN 01/20/22   [name], MD  polyethylene glycol (MIRALAX / GLYCOLAX) 17 g packet Take 17 g by mouth daily. 12/24/21   Regalado, Cassie Freer, MD     Critical care time: n/a      Noe Gens, MSN, APRN, NP-C, AGACNP-BC [facility] 01/28/2022, 5:55 PM   Please see [email or web address] for pager details.   From 7A-7P if no response, please call [phone] After hours, please call ELink [phone]

## 2022-01-28 NOTE — Progress Notes (Addendum)
01/28/2022 APP Noe Gens and I saw and evaluated the patient. Discussed with them and agree with their findings and plan as documented in the their note.  S:  80yF with history of ovarian cancer progressive despite treatment with course c/b recurrent bowel obstruction related to adhesions and disease burden on whom we are asked to consult for enlarging right pleural effusion. She does feel dyspneic and does have orthopnea. Last thora was in 2020.  O: Blood pressure (!) 146/63, pulse 71, temperature (!) 97.4 F (36.3 C), temperature source Oral, resp. rate (!) 24, height '5\' 7"'$  (1.702 m), weight 95.3 kg, SpO2 95 %.   Exam: Gen:       No acute distress HEENT:  tracking, sclera anicteric Neck:      No masses, JVD Lungs:    diminished on r; mildly tachypneic CV:         Regular rate and rhythm; no murmurs Abd:      + bowel sounds; soft, non-tender; no palpable masses, no distension Ext:    No edema; adequate peripheral perfusion Skin:       Warm and dry; no rash Neuro:    alert and oriented x 3 Psych:    normal mood and affect   PLT 196  CTA Chest with enlarging R pleural effusion  A:  # right pleural effusion Although size has increased gradually since May, she hasn't needed thoracentesis since 2020. If after thoracentesis here tomorrow, fluid reaccumulates with pocket sufficient for another intervention in <1 week or so would discuss PleurX depending on goals of care.  P:  - therapeutic/dx thoracentesis tomorrow - hold Gay

## 2022-01-28 NOTE — Telephone Encounter (Signed)
Agree with ER eval. If she did not get admitted she could benefit from palliative consult

## 2022-01-28 NOTE — ED Provider Notes (Signed)
Discussed with pulmonary critical care they will consult for the thoracentesis.  Hospitalist will admit.   Fredia Sorrow, MD 01/28/22 [phone]

## 2022-01-28 NOTE — Progress Notes (Signed)
Patient had fentanyl 30mg/hr patch located on lower left back on admission to the unit. Wasted medication in appropriate container in pyxis room with HGrace BushyRN.

## 2022-01-28 NOTE — ED Provider Notes (Signed)
[facility] DEPT Provider Note   CSN: [id number] Arrival date & time: 01/28/22  1318     History  Chief Complaint  Patient presents with   Shortness of Breath   Chest Pain    Paula Peterson is a 81 y.o. female.  Patient with ovarian cancer that is terminal per family member at the bedside.  Patient recently stopped treatment and is being considered for hospice.  Over the past 2 days she has had chest tightness and shortness of breath and feels like she cannot breathe.  She has had no fever or cough.  She does take Eliquis for history of pulmonary embolism and denies any missed doses.  She had recurrent bowel obstructions from her ovarian cancer in the past but believes this is not the case today.  No vomiting.  Complaining of increased pain to her low back where she has known compression fractures.  Denies any new fall or trauma.  No weakness or numbness in her legs.  No bowel or bladder incontinence. She has a fentanyl patch as well as p.o. oxycodone at home which she is taking without relief.  Daughter reports she is having severe pain everywhere today seems worse than usual.  She describes the pain in her low back in the same location but has progressed. No leg pain or leg swelling.  No fever or vomiting. She is stopped all treatment for cancer and is being considered for hospice but not enrolled yet   The history is provided by the patient and a relative.  Shortness of Breath Associated symptoms: chest pain   Chest Pain Associated symptoms: shortness of breath        Home Medications Prior to Admission medications   Medication Sig Start Date End Date Taking? Authorizing Provider  acetaminophen (TYLENOL) 500 MG tablet Take 1,000 mg by mouth every 6 (six) hours as needed for mild pain or fever.    [provider]  anti-nausea (EMETROL) solution Take 10 mLs by mouth every 15 (fifteen) minutes as needed for nausea or vomiting.    [provider]  apixaban (ELIQUIS) 5 MG TABS tablet Take 1 tablet (5 mg total) by mouth 2 (two) times daily. 10/01/21   [name], MD  atenolol (TENORMIN) 25 MG tablet TAKE ONE (1) TABLET BY MOUTH EVERY DAY 01/26/22   [name], MD  diclofenac Sodium (VOLTAREN) 1 % GEL Apply 1 application. topically daily as needed (arthritis pain).    [provider]  docusate sodium (COLACE) 100 MG capsule Take 1 capsule (100 mg total) by mouth 2 (two) times daily. 12/24/21   Regalado, Belkys A, MD  fentaNYL (DURAGESIC) 12 MCG/HR Place 1 patch onto the skin every 3 (three) days. 12/24/21   Regalado, Jerald Kief A, MD  levothyroxine (SYNTHROID) 75 MCG tablet Take 1 tablet (75 mcg total) by mouth daily before breakfast. 07/18/20   Denton Brick, Courage, MD  lidocaine-prilocaine (EMLA) cream Apply 1 application topically daily as needed (port access). 07/22/21   [name], MD  Magnesium Hydroxide (DULCOLAX SOFT CHEWS PO) Take 2 each by mouth daily as needed (constipation).    [provider]  mometasone (NASONEX) 50 MCG/ACT nasal spray Place 2 sprays into the nose daily as needed (allergies).    [provider]  ondansetron (ZOFRAN) 8 MG tablet TAKE 1 TABLET BY MOUTH TWICE DAILY AS NEEDED FOR NAUSEA & VOMITING Patient taking differently: Take 8 mg by mouth 2 (two) times daily as needed for nausea or  vomiting. 11/18/21   [name], MD  oxyCODONE (OXY IR/ROXICODONE) 5 MG immediate release tablet TAKE 1 TABLET BY MOUTH EVERY 4 HOURS AS NEEDED FOR SEVERE PAIN 01/20/22   [name], MD  polyethylene glycol (MIRALAX / GLYCOLAX) 17 g packet Take 17 g by mouth daily. 12/24/21   Regalado, Cassie Freer, MD      Allergies    Patient has no known allergies.    Review of Systems   Review of Systems  Respiratory:  Positive for shortness of breath.   Cardiovascular:  Positive for chest pain.    Physical Exam Updated Vital Signs BP (!) 151/48   Pulse 77   Temp (!) 97.5 F (36.4 C)   Resp (!) 26   Ht '5\' 7"'$   (1.702 m)   Wt 95.3 kg   SpO2 97%   BMI 32.89 kg/m  Physical Exam Vitals and nursing note reviewed.  Constitutional:      General: She is in acute distress.     Appearance: She is well-developed.     Comments: Tachypneic, speaking in short phrases, uncomfortable appearing  HENT:     Head: Normocephalic and atraumatic.     Mouth/Throat:     Pharynx: No oropharyngeal exudate.  Eyes:     Conjunctiva/sclera: Conjunctivae normal.     Pupils: Pupils are equal, round, and reactive to light.  Neck:     Comments: No meningismus. Cardiovascular:     Rate and Rhythm: Normal rate and regular rhythm.     Heart sounds: Normal heart sounds. No murmur heard. Pulmonary:     Effort: Respiratory distress present.     Breath sounds: Normal breath sounds.     Comments: Tachypneic, no wheezing Diminished breath sounds on the right Abdominal:     Palpations: Abdomen is soft.     Tenderness: There is no abdominal tenderness. There is no guarding or rebound.  Musculoskeletal:        General: Tenderness present. Normal range of motion.     Cervical back: Normal range of motion and neck supple.     Comments: L paraspinal lumbar tenderness  Skin:    General: Skin is warm.  Neurological:     Mental Status: She is alert and oriented to person, place, and time.     Cranial Nerves: No cranial nerve deficit.     Motor: No abnormal muscle tone.     Coordination: Coordination normal.     Comments:  5/5 strength throughout. CN 2-12 intact.Equal grip strength.   Psychiatric:        Behavior: Behavior normal.     ED Results / Procedures / Treatments   Labs (all labs ordered are listed, but only abnormal results are displayed) Labs Reviewed  BASIC METABOLIC PANEL - Abnormal; Notable for the following components:      Result Value   Sodium 134 (*)    CO2 21 (*)    Glucose, Bld 108 (*)    GFR, Estimated 59 (*)    All other components within normal limits  URINALYSIS, ROUTINE W REFLEX MICROSCOPIC -  Abnormal; Notable for the following components:   Color, Urine STRAW (*)    Ketones, ur 5 (*)    Leukocytes,Ua TRACE (*)    All other components within normal limits  BRAIN NATRIURETIC PEPTIDE  CBC WITH DIFFERENTIAL/PLATELET  PROTIME-INR  APTT    EKG EKG Interpretation  Date/Time:  Wednesday January 28 2022 13:26:59 EDT Ventricular Rate:  71 PR Interval:  219 QRS Duration:  90 QT Interval:  374 QTC Calculation: 407 R Axis:   -2 Text Interpretation: Sinus rhythm Borderline prolonged PR interval Low voltage, precordial leads No significant change was found Confirmed by Ezequiel Essex [phone]) on 01/28/2022 2:08:14 PM  Radiology CT ABDOMEN PELVIS W CONTRAST  Result Date: 01/28/2022 CLINICAL DATA:  Acute nonlocalized abdominal pain EXAM: CT ABDOMEN AND PELVIS WITH CONTRAST TECHNIQUE: Multidetector CT imaging of the abdomen and pelvis was performed using the standard protocol following bolus administration of intravenous contrast. RADIATION DOSE REDUCTION: This exam was performed according to the departmental dose-optimization program which includes automated exposure control, adjustment of the mA and/or kV according to patient size and/or use of iterative reconstruction technique. CONTRAST:  115m OMNIPAQUE IOHEXOL 350 MG/ML SOLN COMPARISON:  Radiographs 12/22/2021 and CT 12/20/2021 FINDINGS: Lower chest: Right pleural effusion and associated atelectasis/consolidation. See separate report from same day CTA chest for additional findings in the chest. Hepatobiliary: Multifocal gallbladder wall thickening and nodularity with nodules measuring up to 8 mm in diameter. For example, series 2/image 42 and series 2/image 40. No focal hepatic lesion. No biliary ductal dilation. Pancreas: Unremarkable. No pancreatic ductal dilatation or surrounding inflammatory changes. Spleen: Normal in size without focal abnormality. Adrenals/Urinary Tract: Adrenal glands are unremarkable. Kidneys are normal, without renal  calculi, focal lesion, or hydronephrosis. Bladder is unremarkable. Stomach/Bowel: Stomach is within normal limits. No evidence of bowel wall thickening, distention, or inflammatory changes. The previous dilation of several loops of distal small bowel has. Vascular/Lymphatic: Aortic atherosclerosis. Enhancing rounded right inguinal lymph node is unchanged measuring 15 mm in diameter. Moderate aortoiliac atherosclerotic calcification. No aneurysm. Reproductive: Hysterectomy.  Pessary. Other: Peritoneal nodularity and nodular mesenteric implants scattered throughout the abdomen consistent with metastatic ovarian cancer have increased since 12/20/2021. For example, the soft tissue mass within the anterior peritoneum within the right lower has increased in size now measuring approximally 4.3 x 2.2 cm in the axial plane, previously 4.0 x 2.2 cm. This is intimately associated with the adjacent colon which may be tethered at this location (series 2/image 51). Musculoskeletal: Increased height loss of the T12 vertebral body (approximately 25%) since 12/20/2021. No retropulsion. Unchanged superior endplate fractures of L2, L3, and L4. IMPRESSION: 1. Increased peritoneal nodularity and mesenteric implants consistent with worsening metastatic ovarian cancer. This includes increased size of the soft tissue mass within the anterior right peritoneum abutting the colon which may be tethered at this point. No evidence of bowel obstruction. 2. Multifocal mural thickening and nodularity in the gallbladder concerning for metastases. 3. Increased height loss of the superior endplate fracture of TE75 Unchanged superior endplate fractures of L2, L3, and L4. 4. Moderate right pleural effusion. See same day CT chest report for further details of the findings in the chest. 5.  Aortic Atherosclerosis (ICD10-I70.0). Electronically Signed   By: TPlacido SouM.D.   On: 01/28/2022 16:50   CT Angio Chest PE W and/or Wo Contrast  Result  Date: 01/28/2022 CLINICAL DATA:  Shortness of breath and chest tightness. History of ovarian cancer. EXAM: CT ANGIOGRAPHY CHEST WITH CONTRAST TECHNIQUE: Multidetector CT imaging of the chest was performed using the standard protocol during bolus administration of intravenous contrast. Multiplanar CT image reconstructions and MIPs were obtained to evaluate the vascular anatomy. RADIATION DOSE REDUCTION: This exam was performed according to the departmental dose-optimization program which includes automated exposure control, adjustment of the mA and/or kV according to patient size and/or use of iterative reconstruction technique. CONTRAST:  1064mOMNIPAQUE IOHEXOL 350 MG/ML SOLN COMPARISON:  Chest CT 12/19/2020 FINDINGS: Cardiovascular: Negative for pulmonary embolism. Heart size is normal. Normal caliber of the thoracic aorta. Atherosclerotic calcifications at the aortic arch. Right jugular Port-A-Cath tip is near the superior cavoatrial junction. Mediastinum/Nodes: Heart and mediastinum are slightly displaced toward the left due to large right pleural effusion. Precarinal lymph node measures 8 mm in short axis and previously measured 5 mm. Overall, there is not significant lymph node enlargement in the mediastinum or hilar regions. No axillary lymph node enlargement. Lungs/Pleura: New large right pleural effusion causing near complete collapse of the right lower lobe. Minimal aeration in the right lower lobe. Significant volume loss in the right middle lobe. 4 mm calcified granuloma in the right upper lobe on sequence 6, image 42. Mild interlobular thickening near the right lung apex. Increased volume loss and atelectasis at the left lung base. Stable scarring at the left lung apex. Tiny nodular densities in the left lung are similar to the recent comparison examination examination. Upper Abdomen: Again noted are left renal parapelvic cysts. No acute abnormality of the visualized upper abdomen. Musculoskeletal: New  compression fracture along the superior endplate of L1 with mild vertebral body height loss. There is no significant bone retropulsion at this fracture. Review of the MIP images confirms the above findings. IMPRESSION: 1. Large right pleural effusion with near complete collapse of the right lower lobe and significant volume loss in the right middle lobe. Mediastinal shift towards the left due to the large pleural effusion. 2. New compression fracture involving the superior endplate of L1. 3. Negative for a pulmonary embolism. Electronically Signed   By: Markus Daft M.D.   On: 01/28/2022 16:34   CT Head Wo Contrast  Result Date: 01/28/2022 CLINICAL DATA:  Headache, sudden, severe EXAM: CT HEAD WITHOUT CONTRAST TECHNIQUE: Contiguous axial images were obtained from the base of the skull through the vertex without intravenous contrast. RADIATION DOSE REDUCTION: This exam was performed according to the departmental dose-optimization program which includes automated exposure control, adjustment of the mA and/or kV according to patient size and/or use of iterative reconstruction technique. COMPARISON:  10/16/2020 FINDINGS: Brain: No evidence of acute infarction, hemorrhage, mass, mass effect, or midline shift. No hydrocephalus or extra-axial fluid collection. Periventricular white matter changes, likely the sequela of chronic small vessel ischemic disease. Vascular: No hyperdense vessel. Skull: Normal. Negative for fracture or focal lesion. Sinuses/Orbits: No acute finding. Other: The mastoid air cells are well aerated. IMPRESSION: No acute intracranial process. Electronically Signed   By: Merilyn Baba M.D.   On: 01/28/2022 16:20   DG Chest 2 View  Result Date: 01/28/2022 CLINICAL DATA:  Short of breath EXAM: CHEST - 2 VIEW COMPARISON:  September 2020 FINDINGS: Right chest wall port with catheter overlying the SVC. Right pleural effusion with adjacent atelectasis. Eventration of the left hemidiaphragm.  Cardiomediastinal contours are partially obscured. No pneumothorax. IMPRESSION: Moderate right pleural effusion with adjacent atelectasis. Electronically Signed   By: Macy Mis M.D.   On: 01/28/2022 14:07    Procedures Procedures    Medications Ordered in ED Medications  LORazepam (ATIVAN) injection 1 mg (has no administration in time range)  HYDROmorphone (DILAUDID) injection 0.5 mg (has no administration in time range)    ED Course/ Medical Decision Making/ A&P                           Medical Decision Making Amount and/or Complexity of Data Reviewed Labs: ordered. Decision-making details documented in  ED Course. Radiology: ordered and independent interpretation performed. Decision-making details documented in ED Course. ECG/medicine tests: ordered and independent interpretation performed. Decision-making details documented in ED Course.  Risk Prescription drug management. Decision regarding hospitalization.   Patient with end-stage ovarian cancer here with chest tightness, chest pain and back pain ongoing for the past several days.  Her back pain is chronic and likely secondary to her compression fractures.  On arrival she is uncomfortable and tachypneic but not hypoxic.  No wheezing.  X-rays concerning for right-sided pleural effusion, results reviewed and interpreted by me.  No hypoxia or increased work of breathing but is tachypneic.  CTA shows no pulmonary embolism but does show a large right pleural effusion.  Other CT scans are stable including metastatic disease throughout abdomen and pelvis without evidence of acute obstruction.  CT head obtained at patient's request given her anticoagulation use and severe headaches.  She denies any trauma.  No hemorrhage seen in the head.  Discussed she may need brain MRI for further evaluation to rule out metastatic disease in her brain.  Patient agreeable to therapeutic thoracentesis.  She is tachypneic but not hypoxic.   Admission discussed with Dr. Reesa Chew.  Would not perform thoracentesis emergently given her anticoagulation use and no need for supplemental oxygen. Dr. Reesa Chew requests notification of pulmonology for need for thoracentesis.         Final Clinical Impression(s) / ED Diagnoses Final diagnoses:  Pleural effusion on right    Rx / DC Orders ED Discharge Orders     None         Leaner Morici, Annie Main, MD 01/28/22 1707

## 2022-01-29 ENCOUNTER — Inpatient Hospital Stay (HOSPITAL_COMMUNITY): Payer: Medicare Other

## 2022-01-29 DIAGNOSIS — R079 Chest pain, unspecified: Secondary | ICD-10-CM | POA: Diagnosis not present

## 2022-01-29 DIAGNOSIS — J90 Pleural effusion, not elsewhere classified: Secondary | ICD-10-CM | POA: Diagnosis not present

## 2022-01-29 DIAGNOSIS — E039 Hypothyroidism, unspecified: Secondary | ICD-10-CM | POA: Diagnosis not present

## 2022-01-29 DIAGNOSIS — N1832 Chronic kidney disease, stage 3b: Secondary | ICD-10-CM | POA: Diagnosis not present

## 2022-01-29 LAB — LACTATE DEHYDROGENASE, PLEURAL OR PERITONEAL FLUID: LD, Fluid: 135 U/L — ABNORMAL HIGH (ref 3–23)

## 2022-01-29 LAB — CBC
HCT: 28.7 % — ABNORMAL LOW (ref 36.0–46.0)
Hemoglobin: 9.5 g/dL — ABNORMAL LOW (ref 12.0–15.0)
MCH: 35.8 pg — ABNORMAL HIGH (ref 26.0–34.0)
MCHC: 33.1 g/dL (ref 30.0–36.0)
MCV: 108.3 fL — ABNORMAL HIGH (ref 80.0–100.0)
Platelets: 199 10*3/uL (ref 150–400)
RBC: 2.65 MIL/uL — ABNORMAL LOW (ref 3.87–5.11)
RDW: 15 % (ref 11.5–15.5)
WBC: 3.4 10*3/uL — ABNORMAL LOW (ref 4.0–10.5)
nRBC: 0 % (ref 0.0–0.2)

## 2022-01-29 LAB — BODY FLUID CELL COUNT WITH DIFFERENTIAL
Eos, Fluid: NONE SEEN %
Lymphs, Fluid: 22 %
Monocyte-Macrophage-Serous Fluid: 76 % (ref 50–90)
Neutrophil Count, Fluid: 2 % (ref 0–25)
Total Nucleated Cell Count, Fluid: 409 cu mm (ref 0–1000)

## 2022-01-29 LAB — PROTEIN, PLEURAL OR PERITONEAL FLUID: Total protein, fluid: 4.6 g/dL

## 2022-01-29 LAB — BASIC METABOLIC PANEL
Anion gap: 6 (ref 5–15)
BUN: 12 mg/dL (ref 8–23)
CO2: 27 mmol/L (ref 22–32)
Calcium: 9.3 mg/dL (ref 8.9–10.3)
Chloride: 103 mmol/L (ref 98–111)
Creatinine, Ser: 0.94 mg/dL (ref 0.44–1.00)
GFR, Estimated: >60 mL/min (ref >60)
Glucose, Bld: 101 mg/dL — ABNORMAL HIGH (ref 70–99)
Potassium: 3.8 mmol/L (ref 3.5–5.1)
Sodium: 136 mmol/L (ref 135–145)

## 2022-01-29 LAB — GLUCOSE, PLEURAL OR PERITONEAL FLUID: Glucose, Fluid: 98 mg/dL

## 2022-01-29 LAB — MAGNESIUM: Magnesium: 1.5 mg/dL — ABNORMAL LOW (ref 1.7–2.4)

## 2022-01-29 LAB — FOLATE: Folate: 10.3 ng/mL (ref >5.9)

## 2022-01-29 LAB — TROPONIN I (HIGH SENSITIVITY): Troponin I (High Sensitivity): 12 ng/L (ref <18)

## 2022-01-29 MED ORDER — ENSURE MAX PROTEIN PO LIQD
11.0000 [oz_av] | Freq: Three times a day (TID) | ORAL | Status: DC
  Administered 2022-01-29: 11 [oz_av] via ORAL
  Filled 2022-01-29 (×4): qty 330

## 2022-01-29 MED ORDER — MAGNESIUM OXIDE -MG SUPPLEMENT 400 (240 MG) MG PO TABS
800.0000 mg | ORAL_TABLET | Freq: Once | ORAL | Status: AC
  Administered 2022-01-29: 800 mg via ORAL
  Filled 2022-01-29: qty 2

## 2022-01-29 MED ORDER — ONDANSETRON HCL 4 MG/2ML IJ SOLN
4.0000 mg | Freq: Once | INTRAMUSCULAR | Status: AC
  Administered 2022-01-29: 4 mg via INTRAVENOUS
  Filled 2022-01-29: qty 2

## 2022-01-29 MED ORDER — ADULT MULTIVITAMIN W/MINERALS CH
1.0000 | ORAL_TABLET | Freq: Every day | ORAL | Status: DC
  Administered 2022-01-30: 1 via ORAL
  Filled 2022-01-29: qty 1

## 2022-01-29 MED ORDER — MAGNESIUM SULFATE 2 GM/50ML IV SOLN
2.0000 g | Freq: Once | INTRAVENOUS | Status: AC
  Administered 2022-01-29: 2 g via INTRAVENOUS
  Filled 2022-01-29: qty 50

## 2022-01-29 NOTE — Progress Notes (Signed)
Initial Nutrition Assessment  DOCUMENTATION CODES:   Obesity unspecified  INTERVENTION:   Ensure Max protein supplement TID, each supplement provides 150kcal and 30g of protein.  MVI po daily   Pt at high refeed risk; recommend monitor potassium, magnesium and phosphorus labs daily until stable  NUTRITION DIAGNOSIS:   Increased nutrient needs related to cancer and cancer related treatments as evidenced by estimated needs.  GOAL:   Patient will meet greater than or equal to 90% of their needs  MONITOR:   PO intake, Supplement acceptance, Labs, Weight trends, Skin, I & O's  REASON FOR ASSESSMENT:   Malnutrition Screening Tool    ASSESSMENT:   81 year old with history of right-sided ovarian cancer s/p ex lap ( with right salpingo-oophorectomy, omentectomy radical tumor debulking for ovarian cancer 2020), s/p XRT/chemotherapy(stopped chemotherapy 2 months ago), DVT/PE on Eliquis, HTN, recurrent SBO (medically managed), hypothyroidism, CKD III, GERD, stroke and chronic compression fracture who is admitted for right-sided pleural effusion and progression of ovarian malignancy.  RD working remotely.  Spoke with pt's daughter via phone. Daughter reports patient with decreased appetite and oral intake ever since having her last SBO. Daughter reports that SBO is recurrent and that because of this, pt has been eating mainly liquids and soft foods at baseline. Pt does not eat any vegetables or fiber in her diet. Daughter reports that pt was drinking supplements but she was told by her MD to avoid milky products because of the increased gas. Pt only ate a few bites of potato today and has ordered mac & cheese for dinner. RD discussed pt daughter the importance of adequate nutrition needed to preserve lean muscle. RD discussed that most supplements are lactose free and should not produce gas unless they have added fiber. Pt is willing to drink chocolate Ensure; pt prefers the Ensure Max as she  can no longer tolerate sweet things. RD will add supplements and MVI to help pt meet her estimated needs. Pt is at high refeed risk. Pt reports a recent 11lb weight loss over the past month. Per chart, pt is down 7lbs(3%) over the past 5-6 weeks. Pt does appear fairly weight stable at baseline. RD will obtain nutrition related exam at follow up. Pt is at high risk for malnutrition.   Medications reviewed and include: colace, synthroid, miralax  Labs reviewed: K 3.8 wnl, Mg 1.5(L) Wbc- 3.4(L), Hgb 9.5(L), Hct 28.7(L)  NUTRITION - FOCUSED PHYSICAL EXAM: Unable to perform at this time   Diet Order:   Diet Order             Diet regular Room service appropriate? Yes; Fluid consistency: Thin  Diet effective now                  EDUCATION NEEDS:   Education needs have been addressed  Skin:  Skin Assessment: Reviewed RN Assessment  Last BM:  8/2  Height:   Ht Readings from Last 1 Encounters:  01/28/22 _0  (1.702 m)    Weight:   Wt Readings from Last 1 Encounters:  01/28/22 95.3 kg    Ideal Body Weight:  61.36 kg  BMI:  Body mass index is 32.89 kg/m.  Estimated Nutritional Needs:   Kcal:  2000-2300kcal/day  Protein:  100-115g/day  Fluid:  1.7-1.9L/day  Koleen Distance MS, RD, LDN Please refer to [facility] for RD and/or RD on-call/weekend/after hours pager

## 2022-01-29 NOTE — Procedures (Signed)
Thoracentesis  Procedure Note  HEDWIG MCFALL  [id number]  11/18/1940  Date:01/29/22  Time:10:03 AM   Provider Performing:Alysson Geist M Verlee Monte   Procedure: Thoracentesis with imaging guidance ([id number])  Indication(s) Pleural Effusion  Consent Risks of the procedure as well as the alternatives and risks of each were explained to the patient and/or caregiver.  Consent for the procedure was obtained and is signed in the bedside chart  Anesthesia Topical only with 1% lidocaine    Time Out Verified patient identification, verified procedure, site/side was marked, verified correct patient position, special equipment/implants available, medications/allergies/relevant history reviewed, required imaging and test results available.   Sterile Technique Maximal sterile technique including full sterile barrier drape, hand hygiene, sterile gown, sterile gloves, mask, hair covering, sterile ultrasound probe cover (if used).  Procedure Description Ultrasound was used to identify appropriate pleural anatomy for placement and overlying skin marked.  Area of drainage cleaned and draped in sterile fashion. Lidocaine was used to anesthetize the skin and subcutaneous tissue.  1500 cc's of amber appearing fluid was drained from the right pleural space. Catheter then removed and bandaid applied to site.     Complications/Tolerance None; patient tolerated the procedure well. Chest X-ray is ordered to confirm no post-procedural complication.   EBL Minimal   Specimen(s) Pleural fluid

## 2022-01-29 NOTE — Progress Notes (Signed)
PROGRESS NOTE    Paula Peterson  RJJ:[id number] DOB: April 24, 1941 DOA: 01/28/2022 PCP: Caryl Bis, MD   Brief Narrative:  81 year old with history of right-sided ovarian cancer stopped chemotherapy 2 months ago, DVT/PE on Eliquis, HTN, recurrent SBO, hypothyroidism, chronic compression fracture admitted for right-sided pleural effusion with mediastinal shift.  Pulmonary team consulted.  CT abdomen pelvis shows progression of ovarian malignancy, also has acute L1 fracture on chronic compression fractures.  Palliative care team has been consulted as well.  Assessment & Plan:  Principal Problem:   Chest pain Active Problems:   Right ovarian epithelial cancer (HCC)   Large pleural effusion   Acquired hypothyroidism   PE (pulmonary thromboembolism) (HCC)   Pancytopenia, acquired (HCC)   Peripheral neuropathy due to chemotherapy ([place])   Pulmonary embolism (HCC)   Essential hypertension   CKD (chronic kidney disease), stage III (HCC)   Thrombocytopenia (HCC)   Compression fracture of body of thoracic vertebra (HCC)   Pleural effusion on right     Atypical chest pain with large right-sided pleural effusion and mediastinal shift - Suspect malignant effusion, pulmonary team consulted.  S/p thoracentesis this morning, 1.5L out.  possible Pleurx catheter in case if patient reaccumulates. - Eliquis currently on hold -Troponins remain negative.  EKG nonischemic. Echocardiogram in April showed EF of 55% with grade 1 DD. BNP normal today   Acute on chronic compression fracture, pathological  - Pain control and bowel regimen.   Right ovarian epithelial cancer -CT of the abdomen pelvis today shows worsening of metastatic ovarian cancer in the abdomen.  Patient follows Dr. Alvy Bimler outpatient, stopped her chemotherapy 2 months ago.   History of pulmonary embolism -On home Eliquis which is currently on hold   CKD stage II -Stable   Chronic microcytic anemia - Stable.  We will check TSH,  folate and B12.   History of recurrent SBO -Currently no evidence of obstruction but does have progression of malignancy.   Hypothyroidism - Synthroid  Hypomagnesemia - Repletion   Goals of care discussion - Patient is still conflicted in her decision regarding her CODE STATUS.  I explained to her that she would benefit from hospice/comfort care as she does not plan on any more chemotherapy.  Palliative care consulted to help engage in discussion.  DVT prophylaxis: Eliquis currently on hold Code Status: Full code Family Communication:  Family at Bedside  Status is: Inpatient Remains inpatient appropriate because: Very weak, s/p thoracentesis today. Need PT/OT, palliative care and pain control.   Subjective: In the morning patient was tachypenic, but improved after thoracentesis. Chest pain and shoulder pain improved. Still very weak.   Examination:  General exam: Appears calm and comfortable ; overall weak Respiratory system: RLL diminished BS Cardiovascular system: S1 & S2 heard, RRR. No JVD, murmurs, rubs, gallops or clicks. No pedal edema. Gastrointestinal system: Abdomen is nondistended, soft and nontender. No organomegaly or masses felt. Normal bowel sounds heard. Central nervous system: Alert and oriented. No focal neurological deficits. Extremities: Symmetric 5 x 5 power. Skin: No rashes, lesions or ulcers Psychiatry: Judgement and insight appear normal. Mood & affect appropriate.   Right chest wall chemo port in place.   Objective: Vitals:   01/28/22 1820 01/28/22 2223 01/29/22 0224 01/29/22 0627  BP: (!) 152/62 (!) 115/55 (!) 108/58 117/60  Pulse: 74 70 67 71  Resp: (!) '22 18 18 16  '$ Temp: 97.8 F (36.6 C) (!) 97.5 F (36.4 C) 97.8 F (36.6 C) (!) 97.5 F (36.4 C)  TempSrc: Oral     SpO2: 98% 93% 92% 92%  Weight:      Height:        Intake/Output Summary (Last 24 hours) at 01/29/2022 0735 Last data filed at 01/29/2022 0500 Gross per 24 hour  Intake 120 ml   Output 600 ml  Net -480 ml   Filed Weights   01/28/22 1326  Weight: 95.3 kg     Data Reviewed:   CBC: Recent Labs  Lab 01/28/22 1655 01/29/22 0308  WBC 4.4 3.4*  NEUTROABS 2.2  --   HGB 10.3* 9.5*  HCT 31.5* 28.7*  MCV 107.9* 108.3*  PLT 196 355   Basic Metabolic Panel: Recent Labs  Lab 01/28/22 1404 01/29/22 0308  NA 134* 136  K 4.6 3.8  CL 101 103  CO2 21* 27  GLUCOSE 108* 101*  BUN 13 12  CREATININE 0.98 0.94  CALCIUM 10.0 9.3  MG  --  1.5*   GFR: Estimated Creatinine Clearance: 58.5 mL/min (by C-G formula based on SCr of 0.94 mg/dL). Liver Function Tests: No results for input(s): "AST", "ALT", "ALKPHOS", "BILITOT", "PROT", "ALBUMIN" in the last 168 hours. No results for input(s): "LIPASE", "AMYLASE" in the last 168 hours. No results for input(s): "AMMONIA" in the last 168 hours. Coagulation Profile: Recent Labs  Lab 01/28/22 1655  INR 1.4*   Cardiac Enzymes: No results for input(s): "CKTOTAL", "CKMB", "CKMBINDEX", "TROPONINI" in the last 168 hours. BNP (last 3 results) No results for input(s): "PROBNP" in the last 8760 hours. HbA1C: No results for input(s): "HGBA1C" in the last 72 hours. CBG: No results for input(s): "GLUCAP" in the last 168 hours. Lipid Profile: No results for input(s): "CHOL", "HDL", "LDLCALC", "TRIG", "CHOLHDL", "LDLDIRECT" in the last 72 hours. Thyroid Function Tests: Recent Labs    01/28/22 1750  TSH 1.737   Anemia Panel: Recent Labs    01/28/22 1750 01/29/22 0308  VITAMINB12 313  --   FOLATE  --  10.3   Sepsis Labs: No results for input(s): "PROCALCITON", "LATICACIDVEN" in the last 168 hours.  No results found for this or any previous visit (from the past 240 hour(s)).       Radiology Studies: CT ABDOMEN PELVIS W CONTRAST  Result Date: 01/28/2022 CLINICAL DATA:  Acute nonlocalized abdominal pain EXAM: CT ABDOMEN AND PELVIS WITH CONTRAST TECHNIQUE: Multidetector CT imaging of the abdomen and pelvis was  performed using the standard protocol following bolus administration of intravenous contrast. RADIATION DOSE REDUCTION: This exam was performed according to the departmental dose-optimization program which includes automated exposure control, adjustment of the mA and/or kV according to patient size and/or use of iterative reconstruction technique. CONTRAST:  198m OMNIPAQUE IOHEXOL 350 MG/ML SOLN COMPARISON:  Radiographs 12/22/2021 and CT 12/20/2021 FINDINGS: Lower chest: Right pleural effusion and associated atelectasis/consolidation. See separate report from same day CTA chest for additional findings in the chest. Hepatobiliary: Multifocal gallbladder wall thickening and nodularity with nodules measuring up to 8 mm in diameter. For example, series 2/image 42 and series 2/image 40. No focal hepatic lesion. No biliary ductal dilation. Pancreas: Unremarkable. No pancreatic ductal dilatation or surrounding inflammatory changes. Spleen: Normal in size without focal abnormality. Adrenals/Urinary Tract: Adrenal glands are unremarkable. Kidneys are normal, without renal calculi, focal lesion, or hydronephrosis. Bladder is unremarkable. Stomach/Bowel: Stomach is within normal limits. No evidence of bowel wall thickening, distention, or inflammatory changes. The previous dilation of several loops of distal small bowel has. Vascular/Lymphatic: Aortic atherosclerosis. Enhancing rounded right inguinal lymph node is unchanged  measuring 15 mm in diameter. Moderate aortoiliac atherosclerotic calcification. No aneurysm. Reproductive: Hysterectomy.  Pessary. Other: Peritoneal nodularity and nodular mesenteric implants scattered throughout the abdomen consistent with metastatic ovarian cancer have increased since 12/20/2021. For example, the soft tissue mass within the anterior peritoneum within the right lower has increased in size now measuring approximally 4.3 x 2.2 cm in the axial plane, previously 4.0 x 2.2 cm. This is  intimately associated with the adjacent colon which may be tethered at this location (series 2/image 51). Musculoskeletal: Increased height loss of the T12 vertebral body (approximately 25%) since 12/20/2021. No retropulsion. Unchanged superior endplate fractures of L2, L3, and L4. IMPRESSION: 1. Increased peritoneal nodularity and mesenteric implants consistent with worsening metastatic ovarian cancer. This includes increased size of the soft tissue mass within the anterior right peritoneum abutting the colon which may be tethered at this point. No evidence of bowel obstruction. 2. Multifocal mural thickening and nodularity in the gallbladder concerning for metastases. 3. Increased height loss of the superior endplate fracture of Q33. Unchanged superior endplate fractures of L2, L3, and L4. 4. Moderate right pleural effusion. See same day CT chest report for further details of the findings in the chest. 5.  Aortic Atherosclerosis (ICD10-I70.0). Electronically Signed   By: Placido Sou M.D.   On: 01/28/2022 16:50   CT Angio Chest PE W and/or Wo Contrast  Result Date: 01/28/2022 CLINICAL DATA:  Shortness of breath and chest tightness. History of ovarian cancer. EXAM: CT ANGIOGRAPHY CHEST WITH CONTRAST TECHNIQUE: Multidetector CT imaging of the chest was performed using the standard protocol during bolus administration of intravenous contrast. Multiplanar CT image reconstructions and MIPs were obtained to evaluate the vascular anatomy. RADIATION DOSE REDUCTION: This exam was performed according to the departmental dose-optimization program which includes automated exposure control, adjustment of the mA and/or kV according to patient size and/or use of iterative reconstruction technique. CONTRAST:  155m OMNIPAQUE IOHEXOL 350 MG/ML SOLN COMPARISON:  Chest CT 12/19/2020 FINDINGS: Cardiovascular: Negative for pulmonary embolism. Heart size is normal. Normal caliber of the thoracic aorta. Atherosclerotic  calcifications at the aortic arch. Right jugular Port-A-Cath tip is near the superior cavoatrial junction. Mediastinum/Nodes: Heart and mediastinum are slightly displaced toward the left due to large right pleural effusion. Precarinal lymph node measures 8 mm in short axis and previously measured 5 mm. Overall, there is not significant lymph node enlargement in the mediastinum or hilar regions. No axillary lymph node enlargement. Lungs/Pleura: New large right pleural effusion causing near complete collapse of the right lower lobe. Minimal aeration in the right lower lobe. Significant volume loss in the right middle lobe. 4 mm calcified granuloma in the right upper lobe on sequence 6, image 42. Mild interlobular thickening near the right lung apex. Increased volume loss and atelectasis at the left lung base. Stable scarring at the left lung apex. Tiny nodular densities in the left lung are similar to the recent comparison examination examination. Upper Abdomen: Again noted are left renal parapelvic cysts. No acute abnormality of the visualized upper abdomen. Musculoskeletal: New compression fracture along the superior endplate of L1 with mild vertebral body height loss. There is no significant bone retropulsion at this fracture. Review of the MIP images confirms the above findings. IMPRESSION: 1. Large right pleural effusion with near complete collapse of the right lower lobe and significant volume loss in the right middle lobe. Mediastinal shift towards the left due to the large pleural effusion. 2. New compression fracture involving the superior endplate of L1.  3. Negative for a pulmonary embolism. Electronically Signed   By: Markus Daft M.D.   On: 01/28/2022 16:34   CT Head Wo Contrast  Result Date: 01/28/2022 CLINICAL DATA:  Headache, sudden, severe EXAM: CT HEAD WITHOUT CONTRAST TECHNIQUE: Contiguous axial images were obtained from the base of the skull through the vertex without intravenous contrast.  RADIATION DOSE REDUCTION: This exam was performed according to the departmental dose-optimization program which includes automated exposure control, adjustment of the mA and/or kV according to patient size and/or use of iterative reconstruction technique. COMPARISON:  10/16/2020 FINDINGS: Brain: No evidence of acute infarction, hemorrhage, mass, mass effect, or midline shift. No hydrocephalus or extra-axial fluid collection. Periventricular white matter changes, likely the sequela of chronic small vessel ischemic disease. Vascular: No hyperdense vessel. Skull: Normal. Negative for fracture or focal lesion. Sinuses/Orbits: No acute finding. Other: The mastoid air cells are well aerated. IMPRESSION: No acute intracranial process. Electronically Signed   By: Merilyn Baba M.D.   On: 01/28/2022 16:20   DG Chest 2 View  Result Date: 01/28/2022 CLINICAL DATA:  Short of breath EXAM: CHEST - 2 VIEW COMPARISON:  September 2020 FINDINGS: Right chest wall port with catheter overlying the SVC. Right pleural effusion with adjacent atelectasis. Eventration of the left hemidiaphragm. Cardiomediastinal contours are partially obscured. No pneumothorax. IMPRESSION: Moderate right pleural effusion with adjacent atelectasis. Electronically Signed   By: Macy Mis M.D.   On: 01/28/2022 14:07        Scheduled Meds:  Chlorhexidine Gluconate Cloth  6 each Topical Daily   docusate sodium  100 mg Oral BID   fentaNYL  1 patch Transdermal Q72H   levothyroxine  75 mcg Oral QAC breakfast   polyethylene glycol  17 g Oral Daily   sodium chloride flush  10-40 mL Intracatheter Q12H   Continuous Infusions:   LOS: 1 day   Time spent= 35 mins    Laurena Valko Arsenio Loader, MD [facility]  If 7PM-7AM, please contact night-coverage  01/29/2022, 7:35 AM

## 2022-01-29 NOTE — Evaluation (Addendum)
Physical Therapy Evaluation Patient Details Name: Paula Peterson MRN: [id number] DOB: 02/28/1941 Today's Date: 01/29/2022  History of Present Illness  81 year old with history of right-sided ovarian cancer stopped chemotherapy 2 months ago, DVT/PE on Eliquis, HTN, recurrent SBO, hypothyroidism, chronic compression fracture admitted for right-sided pleural effusion with mediastinal shift. CT abdomen pelvis shows progression of ovarian malignancy, also has acute L1 fracture on chronic compression fractures.  Clinical Impression  Pt admitted with above diagnosis. Pt ambulated 24' with RW, distance limited by chest pain with taking a deep breath, SaO2 93% on room air. She reports she has needed level of assist at home from her daughters.  Pt currently with functional limitations due to the deficits listed below (see PT Problem List). Pt will benefit from skilled PT to increase their independence and safety with mobility to allow discharge to the venue listed below.          Recommendations for follow up therapy are one component of a multi-disciplinary discharge planning process, led by the attending physician.  Recommendations may be updated based on patient status, additional functional criteria and insurance authorization.  Follow Up Recommendations Home health PT      Assistance Recommended at Discharge    Patient can return home with the following  Assistance with cooking/housework;Assist for transportation;Help with stairs or ramp for entrance;A little help with bathing/dressing/bathroom    Equipment Recommendations None recommended by PT  Recommendations for Other Services       Functional Status Assessment Patient has had a recent decline in their functional status and demonstrates the ability to make significant improvements in function in a reasonable and predictable amount of time.     Precautions / Restrictions Precautions Precautions: Fall; back Precaution Comments: denies falls  in past 6 months; back precautions 2* chronic and acute lumbar compression fxs Restrictions Weight Bearing Restrictions: No      Mobility  Bed Mobility Overal bed mobility: Needs Assistance Bed Mobility: Rolling, Sidelying to Sit Rolling: Min assist Sidelying to sit: Supervision, HOB elevated       General bed mobility comments: VCs for log roll technique    Transfers Overall transfer level: Needs assistance Equipment used: Rolling walker (2 wheels) Transfers: Sit to/from Stand Sit to Stand: Supervision           General transfer comment: VCs hand placement    Ambulation/Gait Ambulation/Gait assistance: Min guard Gait Distance (Feet): 24 Feet Assistive device: Rolling walker (2 wheels) Gait Pattern/deviations: Step-through pattern, Decreased stride length Gait velocity: decr     General Gait Details: steady with RW, no loss of balance, distance limited by chest pain with taking a deep breath (RN notified), SaO2 93% on room air walking  Stairs            Wheelchair Mobility    Modified Rankin (Stroke Patients Only)       Balance Overall balance assessment: Modified Independent                                           Pertinent Vitals/Pain Pain Assessment Pain Assessment: 0-10 Pain Score: 8  Pain Location: chest pain with deep breath while walking Pain Descriptors / Indicators: Sharp Pain Intervention(s): Limited activity within patient's tolerance, Monitored during session, Repositioned, Relaxation    Home Living Family/patient expects to be discharged to:: Private residence Living Arrangements: Alone Available Help at Discharge: Family;Available 24 hours/day  Type of Home: House Home Access: Stairs to enter Entrance Stairs-Rails: None Entrance Stairs-Number of Steps: 1   Home Layout: One level Home Equipment: Conservation officer, nature (2 wheels);Other (comment) (lift chair) Additional Comments: 2 daughters live nearby and can  assist as needed    Prior Function Prior Level of Function : Independent/Modified Independent             Mobility Comments: uses RW ADLs Comments: sponge bathes     Hand Dominance   Dominant Hand: Right    Extremity/Trunk Assessment   Upper Extremity Assessment Upper Extremity Assessment: Overall WFL for tasks assessed    Lower Extremity Assessment Lower Extremity Assessment: Overall WFL for tasks assessed    Cervical / Trunk Assessment Cervical / Trunk Assessment: Normal  Communication   Communication: No difficulties  Cognition Arousal/Alertness: Awake/alert Behavior During Therapy: WFL for tasks assessed/performed Overall Cognitive Status: Within Functional Limits for tasks assessed                                          General Comments      Exercises     Assessment/Plan    PT Assessment Patient needs continued PT services  PT Problem List Decreased mobility;Decreased balance;Decreased activity tolerance;Pain       PT Treatment Interventions Gait training;Therapeutic exercise;Therapeutic activities;Patient/family education    PT Goals (Current goals can be found in the Care Plan section)  Acute Rehab PT Goals Patient Stated Goal: decrease pain PT Goal Formulation: With patient Time For Goal Achievement: 02/12/22 Potential to Achieve Goals: Good    Frequency Min 3X/week     Co-evaluation               AM-PAC PT "6 Clicks" Mobility  Outcome Measure Help needed turning from your back to your side while in a flat bed without using bedrails?: A Little Help needed moving from lying on your back to sitting on the side of a flat bed without using bedrails?: A Little Help needed moving to and from a bed to a chair (including a wheelchair)?: A Little Help needed standing up from a chair using your arms (e.g., wheelchair or bedside chair)?: None Help needed to walk in hospital room?: None Help needed climbing 3-5 steps with a  railing? : A Little 6 Click Score: 20    End of Session   Activity Tolerance: Patient limited by pain Patient left: in bed;with bed alarm set;with call bell/phone within reach;with family/visitor present Nurse Communication: Mobility status PT Visit Diagnosis: Pain;Difficulty in walking, not elsewhere classified (R26.2)    Time: 6734-1937 PT Time Calculation (min) (ACUTE ONLY): 15 min   Charges:   PT Evaluation $PT Eval Moderate Complexity: 1 Mod         Philomena Doheny PT 01/29/2022  Acute Rehabilitation Services  Office [phone]

## 2022-01-29 NOTE — TOC Initial Note (Signed)
Transition of Care [facility]) - Initial/Assessment Note    Patient Details  Name: Paula Peterson MRN: [id number] Date of Birth: [birth year]  Transition of Care [facility]) CM/SW Contact:    Leeroy Cha, RN Phone Number: 01/29/2022, 8:22 AM  Clinical Narrative:                  Transition of Care [name]) Screening Note   Patient Details  Name: Paula Peterson Date of Birth: 1941/06/11   Transition of Care [facility]) CM/SW Contact:    Leeroy Cha, RN Phone Number: 01/29/2022, 8:22 AM    Transition of Care [facility]) has reviewed patient and no TOC needs have been identified at this time. We will continue to monitor patient advancement through interdisciplinary progression rounds. If new patient transition needs arise, please place a TOC consult.    Expected Discharge Plan: Home/Self Care Barriers to Discharge: Continued Medical Work up   Patient Goals and CMS Choice Patient states their goals for this hospitalization and ongoing recovery are:: to go back home CMS Medicare.gov Compare Post Acute Care list provided to:: Patient Choice offered to / list presented to : Patient  Expected Discharge Plan and Services Expected Discharge Plan: Home/Self Care   Discharge Planning Services: CM Consult   Living arrangements for the past 2 months: Single Family Home                                      Prior Living Arrangements/Services Living arrangements for the past 2 months: Single Family Home Lives with:: Self (widowed) Patient language and need for interpreter reviewed:: Yes Do you feel safe going back to the place where you live?: Yes            Criminal Activity/Legal Involvement Pertinent to Current Situation/Hospitalization: No - Comment as needed  Activities of Daily Living Home Assistive Devices/Equipment: Environmental consultant (specify type), Wheelchair, Bedside commode/3-in-1 (walker with a seat, lift chair) ADL Screening (condition at time of admission) Patient's  cognitive ability adequate to safely complete daily activities?: Yes Is the patient deaf or have difficulty hearing?: No Does the patient have difficulty seeing, even when wearing glasses/contacts?: No Does the patient have difficulty concentrating, remembering, or making decisions?: No Patient able to express need for assistance with ADLs?: Yes Does the patient have difficulty dressing or bathing?: No Independently performs ADLs?: No Communication: Independent Dressing (OT): Independent Grooming: Independent Feeding: Independent Bathing: Independent Toileting: Needs assistance Is this a change from baseline?: Pre-admission baseline In/Out Bed: Needs assistance Is this a change from baseline?: Pre-admission baseline Walks in Home: Needs assistance Is this a change from baseline?: Pre-admission baseline Does the patient have difficulty walking or climbing stairs?: Yes Weakness of Legs: Both Weakness of Arms/Hands: Both  Permission Sought/Granted                  Emotional Assessment Appearance:: Appears stated age Attitude/Demeanor/Rapport: Engaged Affect (typically observed): Calm Orientation: : Oriented to Place, Oriented to Self, Oriented to  Time, Oriented to Situation Alcohol / Substance Use: Never Used Psych Involvement: No (comment)  Admission diagnosis:  Pleural effusion [J90] Pleural effusion on right [J90] Metastatic malignant neoplasm, unspecified site [facility]) [C79.9] Patient Active Problem List   Diagnosis Date Noted   Chest pain 01/28/2022   Pleural effusion on right 01/28/2022   Compression fracture of body of thoracic vertebra ([place]) 01/06/2022   Partial small bowel obstruction ([place])  12/20/2021   Chronic anemia 12/20/2021   Thrombocytopenia ([place]) 12/20/2021   Cellulitis 11/10/2021   Obesity (BMI 30-39.9) 11/07/2021   Macrocytic anemia 11/07/2021   Poor dentition 10/28/2021   Inflammatory arthritis 08/19/2021   Deficiency anemia 08/19/2021    Intermittent small bowel obstruction ([place]) 06/26/2021   Anemia due to antineoplastic chemotherapy 02/28/2021   Skin infection 02/14/2021   Ear pain 02/07/2021   Drug-induced neutropenia (HCC) 01/17/2021   Headache disorder 10/14/2020   CKD (chronic kidney disease), stage III ([place]) 10/01/2020   Hand foot syndrome 09/10/2020   Pulmonary embolism ([place]) 07/15/2020   Hypokalemia 07/15/2020   Essential hypertension 07/15/2020   Lower back pain 05/28/2020   Elevated serum creatinine 05/01/2020   Recurrent UTI 04/03/2020   Genetic testing 08/11/2019   Adverse effect of chemotherapy 03/14/2019   Pancytopenia, acquired ([place]) 03/10/2019   Peripheral neuropathy due to chemotherapy ([place]) 03/10/2019   Epigastric pain 02/09/2019   PE (pulmonary thromboembolism) ([place]) 02/02/2019   Chronic hip pain 01/27/2019   Acquired hypothyroidism 01/27/2019   Family history of stomach cancer    Goals of care, counseling/discussion 01/06/2019   Elevated liver enzymes 01/06/2019   Other fatigue 01/06/2019   Other constipation 12/07/2018   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 [place] [address] Phone: [phone] Fax: [phone]     Social Determinants of Health ([place]) Interventions    Readmission Risk Interventions     No data to display

## 2022-01-30 DIAGNOSIS — S22000A Wedge compression fracture of unspecified thoracic vertebra, initial encounter for closed fracture: Secondary | ICD-10-CM | POA: Diagnosis not present

## 2022-01-30 DIAGNOSIS — C799 Secondary malignant neoplasm of unspecified site: Secondary | ICD-10-CM

## 2022-01-30 DIAGNOSIS — C561 Malignant neoplasm of right ovary: Secondary | ICD-10-CM | POA: Diagnosis not present

## 2022-01-30 DIAGNOSIS — I2699 Other pulmonary embolism without acute cor pulmonale: Secondary | ICD-10-CM | POA: Diagnosis not present

## 2022-01-30 DIAGNOSIS — Z7189 Other specified counseling: Secondary | ICD-10-CM

## 2022-01-30 DIAGNOSIS — Z515 Encounter for palliative care: Secondary | ICD-10-CM

## 2022-01-30 DIAGNOSIS — Z66 Do not resuscitate: Secondary | ICD-10-CM

## 2022-01-30 LAB — CBC
HCT: 29.3 % — ABNORMAL LOW (ref 36.0–46.0)
Hemoglobin: 9.5 g/dL — ABNORMAL LOW (ref 12.0–15.0)
MCH: 35.4 pg — ABNORMAL HIGH (ref 26.0–34.0)
MCHC: 32.4 g/dL (ref 30.0–36.0)
MCV: 109.3 fL — ABNORMAL HIGH (ref 80.0–100.0)
Platelets: 198 10*3/uL (ref 150–400)
RBC: 2.68 MIL/uL — ABNORMAL LOW (ref 3.87–5.11)
RDW: 15 % (ref 11.5–15.5)
WBC: 3.8 10*3/uL — ABNORMAL LOW (ref 4.0–10.5)
nRBC: 0 % (ref 0.0–0.2)

## 2022-01-30 LAB — BASIC METABOLIC PANEL
Anion gap: 7 (ref 5–15)
BUN: 13 mg/dL (ref 8–23)
CO2: 23 mmol/L (ref 22–32)
Calcium: 8.3 mg/dL — ABNORMAL LOW (ref 8.9–10.3)
Chloride: 107 mmol/L (ref 98–111)
Creatinine, Ser: 0.84 mg/dL (ref 0.44–1.00)
GFR, Estimated: >60 mL/min (ref >60)
Glucose, Bld: 100 mg/dL — ABNORMAL HIGH (ref 70–99)
Potassium: 3.5 mmol/L (ref 3.5–5.1)
Sodium: 137 mmol/L (ref 135–145)

## 2022-01-30 LAB — PHOSPHORUS: Phosphorus: 3.4 mg/dL (ref 2.5–4.6)

## 2022-01-30 LAB — MAGNESIUM: Magnesium: 2.4 mg/dL (ref 1.7–2.4)

## 2022-01-30 LAB — CYTOLOGY - NON PAP

## 2022-01-30 MED ORDER — OXYCODONE HCL 5 MG PO TABS
5.0000 mg | ORAL_TABLET | ORAL | Status: DC | PRN
  Administered 2022-01-30: 10 mg via ORAL
  Filled 2022-01-30: qty 2

## 2022-01-30 MED ORDER — DEXAMETHASONE 4 MG PO TABS
4.0000 mg | ORAL_TABLET | Freq: Every day | ORAL | Status: DC
Start: 2022-01-30 — End: 2022-01-30
  Administered 2022-01-30: 4 mg via ORAL
  Filled 2022-01-30: qty 1

## 2022-01-30 MED ORDER — POLYETHYLENE GLYCOL 3350 17 G PO PACK: 17.0000 g | PACK | Freq: Every day | ORAL | 0 refills | Status: DC | PRN

## 2022-01-30 MED ORDER — FENTANYL 25 MCG/HR TD PT72: 1.0000 | MEDICATED_PATCH | TRANSDERMAL | 0 refills | Status: AC

## 2022-01-30 MED ORDER — FENTANYL 25 MCG/HR TD PT72
1.0000 | MEDICATED_PATCH | TRANSDERMAL | Status: DC
  Administered 2022-01-30: 1 via TRANSDERMAL
  Filled 2022-01-30: qty 1

## 2022-01-30 MED ORDER — BOOST / RESOURCE BREEZE PO LIQD CUSTOM: 1.0000 | Freq: Two times a day (BID) | ORAL | Status: DC

## 2022-01-30 MED ORDER — HEPARIN SOD (PORK) LOCK FLUSH 100 UNIT/ML IV SOLN
500.0000 [IU] | INTRAVENOUS | Status: AC | PRN
  Administered 2022-01-30: 500 [IU]
  Filled 2022-01-30: qty 5

## 2022-01-30 MED ORDER — BISACODYL 5 MG PO TBEC: 5.0000 mg | DELAYED_RELEASE_TABLET | Freq: Every day | ORAL | 0 refills | Status: AC | PRN

## 2022-01-30 MED ORDER — ONDANSETRON 4 MG PO TBDP: 4.0000 mg | ORAL_TABLET | Freq: Three times a day (TID) | ORAL | 0 refills | Status: AC | PRN

## 2022-01-30 MED ORDER — ENOXAPARIN SODIUM 100 MG/ML IJ SOSY
1.0000 mg/kg | PREFILLED_SYRINGE | Freq: Two times a day (BID) | INTRAMUSCULAR | Status: DC
  Administered 2022-01-30: 95 mg via SUBCUTANEOUS
  Filled 2022-01-30: qty 1

## 2022-01-30 MED ORDER — DOCUSATE SODIUM 100 MG PO CAPS: 100.0000 mg | ORAL_CAPSULE | Freq: Two times a day (BID) | ORAL | 0 refills | Status: DC

## 2022-01-30 MED ORDER — DEXAMETHASONE 4 MG PO TABS: 4.0000 mg | ORAL_TABLET | Freq: Every day | ORAL | 0 refills | Status: AC

## 2022-01-30 MED ORDER — POTASSIUM CHLORIDE 20 MEQ PO PACK
40.0000 meq | PACK | Freq: Once | ORAL | Status: AC
  Administered 2022-01-30: 40 meq via ORAL
  Filled 2022-01-30: qty 2

## 2022-01-30 NOTE — Consult Note (Signed)
Palliative Care Consult Note                                  Date: 01/30/2022   Patient Name: Paula Peterson  DOB: 05/04/1941  MRN: [id number]  Age / Sex: 81 y.o., female  PCP: Caryl Bis, MD Referring Physician: Damita Lack, MD  Reason for Consultation: Establishing goals of care  HPI/Patient Profile: 81 y.o. female  with past medical history of ovarian cancer, PE/DVT on Eliquis, HTN, recurrent SBO, hypothyroidism, and chronic compression fractures who presented to [facility] ED on 01/28/2022 with chest pain and dyspnea on exertion. In the ED, she was found to have large right-sided pleural effusion with some mediastinal shift. CTA chest was also negative for PE. CT head was negative. CT abdomen/pelvis showed progression of her ovarian malignancy. Admitted to [facility] service and Palliative Medicine was consulted to address goals of care.   Past Medical History:  Diagnosis Date   Back pain    DVT (deep venous thrombosis) ([place]) 02/02/2019   had DVT first that moved into lung   GERD (gastroesophageal reflux disease)    History of pulmonary embolus (PE) 02/02/2019   confirmed by CT chest- had DVT first that moved into lungs per patient   Hypertension    Ovarian cancer ([place])    Pessary maintenance    Stroke [facility])     Subjective:   I have reviewed medical records including EPIC notes, labs and imaging.   I met with patient and her daughter [name] at bedside to discuss diagnosis, prognosis, GOC, EOL wishes, disposition, and options. Patient reports ongoing nausea and abdominal pain.   I introduced Palliative Medicine as specialized medical care for people living with serious illness. It focuses on providing relief from the symptoms and stress of a serious illness.   Created space and opportunity for patient and family to explore thoughts and feelings regarding current medical situation.Values and goals of care important to patient and family  were attempted to be elicited.  Questions and concerns addressed. Patient/family encouraged to call with questions or concerns.    Life Review: Paula Peterson has lived in [place] her entire life. Prior to retirement, she spent many years working in [place]. She is widowed. She has 2 living children - son/Paula Peterson and daughter/Paula Peterson both live nearby. Her other son Paula Peterson is deceased. She has 3 grandchildren and 1 greatchild with another one on the way. Her faith is Pentecostal.   Functional Status: [name] shares that there has been a drastic change in her mother's functional status over the last 6 weeks. She no longer leaves the house. She is ambulatory with a walker. She is unable to get in the shower, but manages to wash up in the sink. She has been sleeping in her lift chair. She has been eating very little.   Discussion: We discussed patient's current illness and what it means in the larger context of her ongoing co-morbidities. Reviewed the natural disease trajectory of metastatic cancer, with understanding that it is a non-curable condition. Discussed that functional status is generally preserved until late in the disease course, and can be followed by a quick decline over weeks to months.  Kimbree verbalizes understanding the cancer "has gotten worse".   The difference between full scope medical intervention and comfort care was considered.  I introduced the concept of a comfort path, emphasizing that this path involves de-escalating and stopping full  scope medical interventions, allowing a natural course to occur. Discussed that the goal is comfort and dignity rather than cure/prolonging life.   [name] expresses some frustration that her mother was unable to continue chemotherapy. However, at this point understanding her limited prognosis, both patient and daughter want her to return home and focus on comfort and quality of life. I discussed with patient and daughter my recommendation for the  addition of hospice support. Discussed that the hospice team could help with personal care, support for family, and assistance with symptom management. Patient and daughter are agreeable to hospice referral.   We did discuss code status. Encouraged DNR/DNI status understanding evidenced based poor outcomes in similar hospitalized patients, as the cause of the arrest is likely associated with chronic/terminal disease rather than a reversible acute cardio-pulmonary event.  I explained that DNR/DNI is a protective measure to keep [place] from harming the patient in their last moments of life. Patient and daughter agree that DNR status is appropriate.     Review of Systems  Constitutional:  Positive for appetite change and fatigue.  Gastrointestinal:  Positive for abdominal pain and nausea.    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)   Physical Exam Vitals reviewed.  Constitutional:      General: She is not in acute distress.    Appearance: She is ill-appearing.  Pulmonary:     Effort: Pulmonary effort is normal.  Neurological:     Mental Status: She is alert and oriented to person, place, and time.     Motor: Weakness present.     Vital Signs:  BP 132/63 (BP Location: Left Arm)   Pulse 77   Temp 98.2 F (36.8 C) (Oral)   Resp 16   Ht 5' 7"  (1.702 m)   Wt 95.3 kg   SpO2 95%   BMI 32.91 kg/m   Palliative Assessment/Data: PPS 50%     Assessment & Plan:   SUMMARY OF RECOMMENDATIONS   Code status changed to DNR/DNI - gold form completed Home with hospice services - [facility]  Primary  Decision Maker: PATIENT  Symptom Management:  Oxycodone IR 5-10 mg every 4 hours as needed for pain Decadron 4 mg daily Fentanyl patch 25 mcg every 72 hours Zofran ODT 4 mg every 8 hours as needed  Prognosis:  < 6 months  Discharge Planning:  Home with Hospice     Thank you for allowing [place] to participate in the care of Haskel Schroeder  MDM - [place]   Signed by: Elie Confer, NP Palliative Medicine Team  Team Phone # [phone]  For individual providers, please see AMION

## 2022-01-30 NOTE — Evaluation (Signed)
Occupational Therapy Evaluation Patient Details Name: Paula Peterson MRN: [id number] DOB: 1940-07-12 Today's Date: 01/30/2022   History of Present Illness 81 year old with history of right-sided ovarian cancer stopped chemotherapy 2 months ago, DVT/PE on Eliquis, HTN, recurrent SBO, hypothyroidism, chronic compression fracture admitted for right-sided pleural effusion with mediastinal shift. CT abdomen pelvis shows progression of ovarian malignancy, also has acute L1 fracture on chronic compression fractures.   Clinical Impression   Patient is currently requiring assistance with ADLs including Min guard assist with Lower body ADLs, min guard to setup assist with Upper body ADLs,  as well as minimal assist with bed mobility in order to correctly execute log roll technique, and Min guard assist with functional transfers to toilet.  Current level of function may be below patient's typical baseline.  During this evaluation, patient was limited by generalized weakness, impaired activity tolerance, and chronic pain with increased pain to lower back after ambulating as well as post-gait nausea, all of which has the potential to impact patient's safety and independence during functional mobility, as well as performance for ADLs.  Patient lives alone at home, but has 2 daughters near by who are able to provide 24/7 supervision and assistance with plan to rotate shifts to care for pt.  Patient demonstrates good rehab potential, and should benefit from continued skilled occupational therapy services while in acute care to maximize safety, independence and quality of life at home.   ?     Recommendations for follow up therapy are one component of a multi-disciplinary discharge planning process, led by the attending physician.  Recommendations may be updated based on patient status, additional functional criteria and insurance authorization.   Follow Up Recommendations  No OT follow up    Assistance Recommended  at Discharge Frequent or constant Supervision/Assistance  Patient can return home with the following A little help with bathing/dressing/bathroom    Functional Status Assessment  Patient has had a recent decline in their functional status and demonstrates the ability to make significant improvements in function in a reasonable and predictable amount of time.  Equipment Recommendations  None recommended by OT    Recommendations for Other Services       Precautions / Restrictions        Mobility Bed Mobility Overal bed mobility: Needs Assistance Bed Mobility: Rolling, Sidelying to Sit Rolling: Min assist Sidelying to sit: Supervision, HOB elevated       General bed mobility comments: VCs for log roll technique, however pt with decreased ability to follow instructions and required [place] for proper log roll form.    Transfers                          Balance Overall balance assessment: Mild deficits observed, not formally tested                                         ADL either performed or assessed with clinical judgement   ADL Overall ADL's : Needs assistance/impaired Eating/Feeding: Modified independent;Sitting   Grooming: Sitting;Set up;Wash/dry face   Upper Body Bathing: Min guard;Sitting   Lower Body Bathing: Min guard;Cueing for back precautions   Upper Body Dressing : Min guard;Sitting   Lower Body Dressing: Min guard;Adhering to back precautions Lower Body Dressing Details (indicate cue type and reason): Pt able to demonstrate figure 4 position while in recliner  and doffed/donned each sock with increased effort. Min guard for standing LE dressing. Toilet Transfer: Rolling walker (2 wheels);Ambulation;Cueing for sequencing;Cueing for safety Toilet Transfer Details (indicate cue type and reason): Pt stood from EOB with Min guard assist and cues for hands. Pt used RW to ambulate 100' with Min guard to supervision. Noted once back  in recliner, pt pale and endorsing nausea. Color did return to face after ~1 min and RN notified. Toileting- Water quality scientist and Hygiene: Min guard;Adhering to back precautions       Functional mobility during ADLs: Min guard;Rolling walker (2 wheels)       Vision Baseline Vision/History: 1 Wears glasses Ability to See in Adequate Light: 1 Impaired Patient Visual Report: No change from baseline Additional Comments: Pt reports fatigue after reading short amounts of time, and reported that she enjoys reading. Pt and daughter educated on free [employer] for [employer] through [employer]. Daughter verbalized understanding.     Perception     Praxis      Pertinent Vitals/Pain Pain Assessment Pain Assessment: 0-10 Pain Score: 8  Pain Location: back, stomach, head. Pt reports that pain is chronic and every day Pain Intervention(s): Limited activity within patient's tolerance, Premedicated before session, Repositioned, Monitored during session     Hand Dominance Right   Extremity/Trunk Assessment Upper Extremity Assessment Upper Extremity Assessment: Overall WFL for tasks assessed   Lower Extremity Assessment Lower Extremity Assessment: Overall WFL for tasks assessed   Cervical / Trunk Assessment Cervical / Trunk Assessment: Normal   Communication Communication Communication: No difficulties   Cognition Arousal/Alertness: Awake/alert Behavior During Therapy: WFL for tasks assessed/performed, Flat affect Overall Cognitive Status: Within Functional Limits for tasks assessed                                       General Comments       Exercises     Shoulder Instructions      Home Living Family/patient expects to be discharged to:: Private residence Living Arrangements: Alone Available Help at Discharge: Family;Available 24 hours/day Type of Home: House Home Access: Stairs to enter [employer] of Steps: 1 Entrance  Stairs-Rails: None;Right Home Layout: One level     Bathroom Shower/Tub: Teacher, early years/pre: Handicapped height     Home Equipment: Conservation officer, nature (2 wheels);Other (comment);BSC/3in1;Rollator (4 wheels);Wheelchair - Teacher, English as a foreign language where pt sleeps) Adaptive Equipment: Reacher Additional Comments: 2 daughters live nearby and can assist as needed      Prior Functioning/Environment Prior Level of Function : Independent/Modified Independent       Physical Assist : ADLs (physical)   ADLs (physical): IADLs Mobility Comments: uses RW ADLs Comments: sponge bathes; family assists with heavier housework. Does not drive. Daughters assist with transportation. Pt reports independence with medication management.        OT Problem List: Pain;Decreased activity tolerance      OT Treatment/Interventions: Self-care/ADL training;Therapeutic activities;Energy conservation;Visual/perceptual remediation/compensation;Patient/family education;DME and/or AE instruction;Balance training    OT Goals(Current goals can be found in the care plan section) Acute Rehab OT Goals Patient Stated Goal: To go home OT Goal Formulation: With patient/family Time For Goal Achievement: 02/13/22 Potential to Achieve Goals: Good ADL Goals Pt Will Perform Grooming: with modified independence;standing Pt Will Perform Lower Body Dressing: with modified independence;with adaptive equipment;sitting/lateral leans;sit to/from stand Pt Will Perform Toileting - Clothing Manipulation and hygiene: with  modified independence;sitting/lateral leans;sit to/from stand (with avoidance of twisting trunk) Additional ADL Goal #1: Pt will engage in 10 min functional activities, involving transfers and combination of sitting and standing tasks, without loss of sitting or standing balance, in order to demonstrate improved activity tolerance and balance needed to perform ADLs safely at home.  OT  Frequency: Min 2X/week    Co-evaluation              AM-PAC OT "6 Clicks" Daily Activity     Outcome Measure Help from another person eating meals?: None Help from another person taking care of personal grooming?: None Help from another person toileting, which includes using toliet, bedpan, or urinal?: A Little Help from another person bathing (including washing, rinsing, drying)?: A Little Help from another person to put on and taking off regular upper body clothing?: A Little Help from another person to put on and taking off regular lower body clothing?: A Little 6 Click Score: 20   End of Session Equipment Utilized During Treatment: Gait belt;Rolling walker (2 wheels) Nurse Communication: Other (comment) (Nauseated after ambulation)  Activity Tolerance: Patient limited by fatigue Patient left: in chair;with call bell/phone within reach;with chair alarm set;with family/visitor present  OT Visit Diagnosis: Pain Pain - part of body:  (back)                Time: 1004 (Split time with initial in time: [phone] (Initial out time 0913 due to NP to room.) OT Time Calculation (min): 19 min= 28 min total Charges:  OT General Charges $OT Visit: 1 Visit OT Evaluation $OT Eval Low Complexity: 1 Low OT Treatments $Therapeutic Activity: 8-22 mins  Anderson Malta, OT Acute Rehab Services Office: [phone] 01/30/2022  Julien Girt 01/30/2022, 10:38 AM

## 2022-01-30 NOTE — Progress Notes (Signed)
PROGRESS NOTE    Paula Peterson  XVQ:[id number] DOB: [birth year] DOA: 01/28/2022 PCP: Caryl Bis, MD   Brief Narrative:  81 year old with history of right-sided ovarian cancer stopped chemotherapy 2 months ago, DVT/PE on Eliquis, HTN, recurrent SBO, hypothyroidism, chronic compression fracture admitted for right-sided pleural effusion with mediastinal shift.  Pulmonary team consulted.  CT abdomen pelvis shows progression of ovarian malignancy, also has acute L1 fracture on chronic compression fractures.  Palliative care team has been consulted as well.  Thoracentesis performed on 8/3.  Patient transitioned to hospice.  Assessment & Plan:  Principal Problem:   Chest pain Active Problems:   Right ovarian epithelial cancer (HCC)   Large pleural effusion   Acquired hypothyroidism   PE (pulmonary thromboembolism) (HCC)   Pancytopenia, acquired (HCC)   Peripheral neuropathy due to chemotherapy ([place])   Pulmonary embolism (HCC)   Essential hypertension   CKD (chronic kidney disease), stage III (HCC)   Thrombocytopenia (HCC)   Compression fracture of body of thoracic vertebra (HCC)   Pleural effusion on right     Atypical chest pain with large right-sided pleural effusion and mediastinal shift - Suspect malignant effusion, pulmonary team consulted.  S/p thoracentesis , 1.5L out.  No evidence of infection.  Can consider Pleurx catheter in the future if the fluid reaccumulates. - Currently on Lovenox 1 mg/kg -Troponins remain negative.  EKG nonischemic. Echocardiogram in April showed EF of 55% with grade 1 DD. BNP normal today   Acute on chronic compression fracture, pathological  - Pain control and bowel regimen.   Right ovarian epithelial cancer -CT of the abdomen pelvis today shows worsening of metastatic ovarian cancer in the abdomen.  Patient follows Dr. Alvy Bimler outpatient, stopped her chemotherapy 2 months ago.   History of pulmonary embolism -On home Eliquis which is currently  on hold.  On Lovenox 1 mg/kg   CKD stage II -Stable   Chronic microcytic anemia - Stable.     History of recurrent SBO -Currently no evidence of obstruction but does have progression of malignancy.   Hypothyroidism - Synthroid  Hypomagnesemia - Repletion   Goals of care discussion - Patient and family has decided to transition her to hospice  DVT prophylaxis: Eliquis currently on hold Code Status: Full code Family Communication:  Family at Bedside  Status is: Inpatient Remains inpatient appropriate because: Hospice team will help making home arrangements.  Hopefully discharge in next 24 hours once all arrangements have been made.  Subjective: No complaints overall feels better  Examination:  Constitutional: Not in acute distress Respiratory: Clear to auscultation bilaterally Cardiovascular: Normal sinus rhythm, no rubs Abdomen: Nontender nondistended good bowel sounds Musculoskeletal: No edema noted Skin: No rashes seen Neurologic: CN 2-12 grossly intact.  And nonfocal Psychiatric: Normal judgment and insight. Alert and oriented x 3. Normal mood. Right chest wall chemo port in place.   Objective: Vitals:   01/29/22 1213 01/29/22 2108 01/30/22 0500 01/30/22 0507  BP: (!) 108/50 (!) 122/53  132/63  Pulse: 73 78  77  Resp: '20 16  16  '$ Temp: 98.3 F (36.8 C) 98.8 F (37.1 C)  98.2 F (36.8 C)  TempSrc: Oral Oral  Oral  SpO2: 95% 93%  95%  Weight:   95.3 kg   Height:        Intake/Output Summary (Last 24 hours) at 01/30/2022 1307 Last data filed at 01/29/2022 1400 Gross per 24 hour  Intake 10.33 ml  Output --  Net 10.33 ml   [employer]  01/28/22 1326 01/30/22 0500  Weight: 95.3 kg 95.3 kg     Data Reviewed:   CBC: Recent Labs  Lab 01/28/22 1655 01/29/22 0308 01/30/22 0223  WBC 4.4 3.4* 3.8*  NEUTROABS 2.2  --   --   HGB 10.3* 9.5* 9.5*  HCT 31.5* 28.7* 29.3*  MCV 107.9* 108.3* 109.3*  PLT 196 199 888   Basic Metabolic Panel: Recent Labs   Lab 01/28/22 1404 01/29/22 0308 01/30/22 0223  NA 134* 136 137  K 4.6 3.8 3.5  CL 101 103 107  CO2 21* 27 23  GLUCOSE 108* 101* 100*  BUN '13 12 13  '$ CREATININE 0.98 0.94 0.84  CALCIUM 10.0 9.3 8.3*  MG  --  1.5* 2.4  PHOS  --   --  3.4   GFR: Estimated Creatinine Clearance: 65.4 mL/min (by C-G formula based on SCr of 0.84 mg/dL). Liver Function Tests: No results for input(s): "AST", "ALT", "ALKPHOS", "BILITOT", "PROT", "ALBUMIN" in the last 168 hours. No results for input(s): "LIPASE", "AMYLASE" in the last 168 hours. No results for input(s): "AMMONIA" in the last 168 hours. Coagulation Profile: Recent Labs  Lab 01/28/22 1655  INR 1.4*   Cardiac Enzymes: No results for input(s): "CKTOTAL", "CKMB", "CKMBINDEX", "TROPONINI" in the last 168 hours. BNP (last 3 results) No results for input(s): "PROBNP" in the last 8760 hours. HbA1C: No results for input(s): "HGBA1C" in the last 72 hours. CBG: No results for input(s): "GLUCAP" in the last 168 hours. Lipid Profile: No results for input(s): "CHOL", "HDL", "LDLCALC", "TRIG", "CHOLHDL", "LDLDIRECT" in the last 72 hours. Thyroid Function Tests: Recent Labs    01/28/22 1750  TSH 1.737   Anemia Panel: Recent Labs    01/28/22 1750 01/29/22 0308  VITAMINB12 313  --   FOLATE  --  10.3   Sepsis Labs: No results for input(s): "PROCALCITON", "LATICACIDVEN" in the last 168 hours.  Recent Results (from the past 240 hour(s))  Body fluid culture w Gram Stain     Status: None (Preliminary result)   Collection Time: 01/29/22 10:13 AM   Specimen: Pleural Fluid  Result Value Ref Range Status   Specimen Description   Final    PLEURAL Performed at [facility] [address], [place], [place] [address]    Special Requests   Final    NONE Performed at [facility], [place] [address] [place], [place] [address]    Gram Stain PENDING  Incomplete   Culture   Final    NO GROWTH < 24  HOURS Performed at [facility], [place] [address] [place],  [address]    Report Status PENDING  Incomplete         Radiology Studies: DG CHEST PORT 1 VIEW  Result Date: 01/29/2022 CLINICAL DATA:  Status post thoracentesis. EXAM: PORTABLE CHEST 1 VIEW COMPARISON:  January 28, 2022. FINDINGS: No pneumothorax is noted status post right-sided thoracentesis. Right pleural effusion is significantly smaller. IMPRESSION: No definite pneumothorax status post right-sided thoracentesis. Electronically Signed   By: Marijo Conception M.D.   On: 01/29/2022 10:43   CT ABDOMEN PELVIS W CONTRAST  Result Date: 01/28/2022 CLINICAL DATA:  Acute nonlocalized abdominal pain EXAM: CT ABDOMEN AND PELVIS WITH CONTRAST TECHNIQUE: Multidetector CT imaging of the abdomen and pelvis was performed using the standard protocol following bolus administration of intravenous contrast. RADIATION DOSE REDUCTION: This exam was performed according to the departmental dose-optimization program which includes automated exposure control, adjustment of the mA  and/or kV according to patient size and/or use of iterative reconstruction technique. CONTRAST:  177m OMNIPAQUE IOHEXOL 350 MG/ML SOLN COMPARISON:  Radiographs 12/22/2021 and CT 12/20/2021 FINDINGS: Lower chest: Right pleural effusion and associated atelectasis/consolidation. See separate report from same day CTA chest for additional findings in the chest. Hepatobiliary: Multifocal gallbladder wall thickening and nodularity with nodules measuring up to 8 mm in diameter. For example, series 2/image 42 and series 2/image 40. No focal hepatic lesion. No biliary ductal dilation. Pancreas: Unremarkable. No pancreatic ductal dilatation or surrounding inflammatory changes. Spleen: Normal in size without focal abnormality. Adrenals/Urinary Tract: Adrenal glands are unremarkable. Kidneys are normal, without renal calculi, focal lesion, or hydronephrosis. Bladder is unremarkable.  Stomach/Bowel: Stomach is within normal limits. No evidence of bowel wall thickening, distention, or inflammatory changes. The previous dilation of several loops of distal small bowel has. Vascular/Lymphatic: Aortic atherosclerosis. Enhancing rounded right inguinal lymph node is unchanged measuring 15 mm in diameter. Moderate aortoiliac atherosclerotic calcification. No aneurysm. Reproductive: Hysterectomy.  Pessary. Other: Peritoneal nodularity and nodular mesenteric implants scattered throughout the abdomen consistent with metastatic ovarian cancer have increased since 12/20/2021. For example, the soft tissue mass within the anterior peritoneum within the right lower has increased in size now measuring approximally 4.3 x 2.2 cm in the axial plane, previously 4.0 x 2.2 cm. This is intimately associated with the adjacent colon which may be tethered at this location (series 2/image 51). Musculoskeletal: Increased height loss of the T12 vertebral body (approximately 25%) since 12/20/2021. No retropulsion. Unchanged superior endplate fractures of L2, L3, and L4. IMPRESSION: 1. Increased peritoneal nodularity and mesenteric implants consistent with worsening metastatic ovarian cancer. This includes increased size of the soft tissue mass within the anterior right peritoneum abutting the colon which may be tethered at this point. No evidence of bowel obstruction. 2. Multifocal mural thickening and nodularity in the gallbladder concerning for metastases. 3. Increased height loss of the superior endplate fracture of TI14 Unchanged superior endplate fractures of L2, L3, and L4. 4. Moderate right pleural effusion. See same day CT chest report for further details of the findings in the chest. 5.  Aortic Atherosclerosis (ICD10-I70.0). Electronically Signed   By: TPlacido SouM.D.   On: 01/28/2022 16:50   CT Angio Chest PE W and/or Wo Contrast  Result Date: 01/28/2022 CLINICAL DATA:  Shortness of breath and chest  tightness. History of ovarian cancer. EXAM: CT ANGIOGRAPHY CHEST WITH CONTRAST TECHNIQUE: Multidetector CT imaging of the chest was performed using the standard protocol during bolus administration of intravenous contrast. Multiplanar CT image reconstructions and MIPs were obtained to evaluate the vascular anatomy. RADIATION DOSE REDUCTION: This exam was performed according to the departmental dose-optimization program which includes automated exposure control, adjustment of the mA and/or kV according to patient size and/or use of iterative reconstruction technique. CONTRAST:  1081mOMNIPAQUE IOHEXOL 350 MG/ML SOLN COMPARISON:  Chest CT 12/19/2020 FINDINGS: Cardiovascular: Negative for pulmonary embolism. Heart size is normal. Normal caliber of the thoracic aorta. Atherosclerotic calcifications at the aortic arch. Right jugular Port-A-Cath tip is near the superior cavoatrial junction. Mediastinum/Nodes: Heart and mediastinum are slightly displaced toward the left due to large right pleural effusion. Precarinal lymph node measures 8 mm in short axis and previously measured 5 mm. Overall, there is not significant lymph node enlargement in the mediastinum or hilar regions. No axillary lymph node enlargement. Lungs/Pleura: New large right pleural effusion causing near complete collapse of the right lower lobe. Minimal aeration in the right lower lobe. Significant  volume loss in the right middle lobe. 4 mm calcified granuloma in the right upper lobe on sequence 6, image 42. Mild interlobular thickening near the right lung apex. Increased volume loss and atelectasis at the left lung base. Stable scarring at the left lung apex. Tiny nodular densities in the left lung are similar to the recent comparison examination examination. Upper Abdomen: Again noted are left renal parapelvic cysts. No acute abnormality of the visualized upper abdomen. Musculoskeletal: New compression fracture along the superior endplate of L1 with  mild vertebral body height loss. There is no significant bone retropulsion at this fracture. Review of the MIP images confirms the above findings. IMPRESSION: 1. Large right pleural effusion with near complete collapse of the right lower lobe and significant volume loss in the right middle lobe. Mediastinal shift towards the left due to the large pleural effusion. 2. New compression fracture involving the superior endplate of L1. 3. Negative for a pulmonary embolism. Electronically Signed   By: Markus Daft M.D.   On: 01/28/2022 16:34   CT Head Wo Contrast  Result Date: 01/28/2022 CLINICAL DATA:  Headache, sudden, severe EXAM: CT HEAD WITHOUT CONTRAST TECHNIQUE: Contiguous axial images were obtained from the base of the skull through the vertex without intravenous contrast. RADIATION DOSE REDUCTION: This exam was performed according to the departmental dose-optimization program which includes automated exposure control, adjustment of the mA and/or kV according to patient size and/or use of iterative reconstruction technique. COMPARISON:  10/16/2020 FINDINGS: Brain: No evidence of acute infarction, hemorrhage, mass, mass effect, or midline shift. No hydrocephalus or extra-axial fluid collection. Periventricular white matter changes, likely the sequela of chronic small vessel ischemic disease. Vascular: No hyperdense vessel. Skull: Normal. Negative for fracture or focal lesion. Sinuses/Orbits: No acute finding. Other: The mastoid air cells are well aerated. IMPRESSION: No acute intracranial process. Electronically Signed   By: Merilyn Baba M.D.   On: 01/28/2022 16:20   DG Chest 2 View  Result Date: 01/28/2022 CLINICAL DATA:  Short of breath EXAM: CHEST - 2 VIEW COMPARISON:  September 2020 FINDINGS: Right chest wall port with catheter overlying the SVC. Right pleural effusion with adjacent atelectasis. Eventration of the left hemidiaphragm. Cardiomediastinal contours are partially obscured. No pneumothorax.  IMPRESSION: Moderate right pleural effusion with adjacent atelectasis. Electronically Signed   By: Macy Mis M.D.   On: 01/28/2022 14:07        Scheduled Meds:  Chlorhexidine Gluconate Cloth  6 each Topical Daily   dexamethasone  4 mg Oral Daily   docusate sodium  100 mg Oral BID   enoxaparin (LOVENOX) injection  1 mg/kg Subcutaneous Q12H   feeding supplement  1 Container Oral BID BM   fentaNYL  1 patch Transdermal Q72H   levothyroxine  75 mcg Oral QAC breakfast   multivitamin with minerals  1 tablet Oral Daily   sodium chloride flush  10-40 mL Intracatheter Q12H   Continuous Infusions:   LOS: 2 days   Time spent= 35 mins    Verlyn Dannenberg Arsenio Loader, MD [facility]  If 7PM-7AM, please contact night-coverage  01/30/2022, 1:07 PM

## 2022-01-30 NOTE — TOC Transition Note (Signed)
Transition of Care [facility]) - CM/SW Discharge Note   Patient Details  Name: Paula Peterson MRN: [id number] Date of Birth: Oct 10, 1940  [facility] of [facility] CM/SW Contact:  Lennart Pall, LCSW Phone Number: 01/30/2022, 10:55 AM   Clinical Narrative:    Received TOC orders to assist with referral for Hospice in the home.  Met with pt and daughter to confirm they are choosing [facility] - referral placed with Aldona Bar who will follow up with pt / family directly.  Anticipating dc today/ tomorrow.     Final next level of care: Home w Hospice Care Barriers to Discharge: Barriers Resolved   Patient Goals and CMS Choice Patient states their goals for this hospitalization and ongoing recovery are:: to go back home CMS Medicare.gov Compare Post Acute Care list provided to:: Patient Choice offered to / list presented to : Patient  Discharge Placement                       Discharge Plan and Services   Discharge Planning Services: CM Consult                      HH Arranged: RN [facility] Agency: [place] Date [place]: 01/30/22 Time [place]: 27 Representative spoke with at [place]: [place] ([place]) Interventions     Readmission Risk Interventions    01/30/2022   10:53 AM  Readmission Risk Prevention Plan  Transportation Screening Complete  Medication Review [employer]) Complete  PCP or Specialist appointment within 3-5 days of discharge Complete  HRI or [place] Complete  SW Recovery Care/Counseling Consult Complete  Palliative Care Screening Complete  [place] Not Applicable

## 2022-01-30 NOTE — Discharge Summary (Signed)
Physician Discharge Summary  Paula Peterson GMW:[id number] DOB: 1940/11/05 DOA: 01/28/2022  PCP: Caryl Bis, MD  Admit date: 01/28/2022 Discharge date: 01/30/2022  Admitted From: Home Disposition:  Home with hospice   Recommendations for Outpatient Follow-up:  Follow up with PCP in 1-2 weeks Please obtain BMP/CBC in one week your next doctors visit.    Discharge Condition: Stable CODE STATUS: DNR Diet recommendation: Regular  Brief/Interim Summary: 81 year old with history of right-sided ovarian cancer stopped chemotherapy 2 months ago, DVT/PE on Eliquis, HTN, recurrent SBO, hypothyroidism, chronic compression fracture admitted for right-sided pleural effusion with mediastinal shift.  Pulmonary team consulted.  CT abdomen pelvis shows progression of ovarian malignancy, also has acute L1 fracture on chronic compression fractures.  Palliative care team has been consulted as well.  Thoracentesis performed on 8/3.  Patient transitioned to hospice.  Home arrangements made.   Assessment & Plan:  Principal Problem:   Chest pain Active Problems:   Right ovarian epithelial cancer (HCC)   Large pleural effusion   Acquired hypothyroidism   PE (pulmonary thromboembolism) (HCC)   Pancytopenia, acquired (HCC)   Peripheral neuropathy due to chemotherapy ([name])   Pulmonary embolism (HCC)   Essential hypertension   CKD (chronic kidney disease), stage III (HCC)   Thrombocytopenia (HCC)   Compression fracture of body of thoracic vertebra (HCC)   Pleural effusion on right     Atypical chest pain with large right-sided pleural effusion and mediastinal shift - Suspect malignant effusion, pulmonary team consulted.  S/p thoracentesis , 1.5L out.  No evidence of infection.  Can consider Pleurx catheter in the future if the fluid reaccumulates. - Currently on Lovenox 1 mg/kg, transitioning back to home oral Eliquis upon discharge.  Decadron daily prescribed.  As needed Zofran prescribed. -Troponins  remain negative.  EKG nonischemic. Echocardiogram in April showed EF of 55% with grade 1 DD. BNP normal today   Acute on chronic compression fracture, pathological  - Pain control and bowel regimen.   Right ovarian epithelial cancer -CT of the abdomen pelvis today shows worsening of metastatic ovarian cancer in the abdomen.  Patient follows Dr. Alvy Bimler outpatient, stopped her chemotherapy 2 months ago.   History of pulmonary embolism -On home Eliquis   CKD stage II -Stable   Chronic microcytic anemia - Stable.     History of recurrent SBO -Currently no evidence of obstruction but does have progression of malignancy.   Hypothyroidism - Synthroid   Hypomagnesemia - Repletion   Goals of care discussion - Patient and family has decided to transition her to hospice     Discharge Diagnoses:  Principal Problem:   Chest pain Active Problems:   Right ovarian epithelial cancer (HCC)   Large pleural effusion   Acquired hypothyroidism   PE (pulmonary thromboembolism) (HCC)   Pancytopenia, acquired (HCC)   Peripheral neuropathy due to chemotherapy ([place])   Pulmonary embolism (HCC)   Essential hypertension   CKD (chronic kidney disease), stage III (HCC)   Thrombocytopenia (HCC)   Compression fracture of body of thoracic vertebra (HCC)   Pleural effusion on right   Hospice care patient      Consultations: Pulmonary Palliative care  Subjective: Feeling better no complaints  Discharge Exam: Vitals:   01/29/22 2108 01/30/22 0507  BP: (!) 122/53 132/63  Pulse: 78 77  Resp: 16 16  Temp: 98.8 F (37.1 C) 98.2 F (36.8 C)  SpO2: 93% 95%   Vitals:   01/29/22 1213 01/29/22 2108 01/30/22 0500 01/30/22 0507  BP: Marland Kitchen)  108/50 (!) 122/53  132/63  Pulse: 73 78  77  Resp: '20 16  16  '$ Temp: 98.3 F (36.8 C) 98.8 F (37.1 C)  98.2 F (36.8 C)  TempSrc: Oral Oral  Oral  SpO2: 95% 93%  95%  Weight:   95.3 kg   Height:        General: Pt is alert, awake, not in acute  distress Cardiovascular: RRR, S1/S2 +, no rubs, no gallops Respiratory: CTA bilaterally, no wheezing, no rhonchi Abdominal: Soft, NT, ND, bowel sounds + Extremities: no edema, no cyanosis  Discharge Instructions   Allergies as of 01/30/2022   No Known Allergies      Medication List     STOP taking these medications    DULCOLAX SOFT CHEWS PO   fentaNYL 12 MCG/HR Commonly known as: DURAGESIC Replaced by: fentaNYL 25 MCG/HR   ondansetron 8 MG tablet Commonly known as: ZOFRAN       TAKE these medications    acetaminophen 500 MG tablet Commonly known as: TYLENOL Take 1,000 mg by mouth every 6 (six) hours as needed for mild pain or fever.   anti-nausea solution Take 10 mLs by mouth every 15 (fifteen) minutes as needed for nausea or vomiting.   apixaban 5 MG Tabs tablet Commonly known as: Eliquis Take 1 tablet (5 mg total) by mouth 2 (two) times daily.   atenolol 25 MG tablet Commonly known as: TENORMIN TAKE ONE (1) TABLET BY MOUTH EVERY DAY What changed: how much to take   bisacodyl 5 MG EC tablet Commonly known as: DULCOLAX Take 1 tablet (5 mg total) by mouth daily as needed for moderate constipation.   dexamethasone 4 MG tablet Commonly known as: DECADRON Take 1 tablet (4 mg total) by mouth daily. Start taking on: January 31, 2022   docusate sodium 100 MG capsule Commonly known as: COLACE Take 1 capsule (100 mg total) by mouth 2 (two) times daily.   fentaNYL 25 MCG/HR Commonly known as: [place] 1 patch onto the skin every 3 (three) days. Start taking on: February 02, 2022 Replaces: fentaNYL 12 MCG/HR   levothyroxine 75 MCG tablet Commonly known as: SYNTHROID Take 1 tablet (75 mcg total) by mouth daily before breakfast.   lidocaine-prilocaine cream Commonly known as: EMLA Apply 1 application topically daily as needed (port access).   mometasone 50 MCG/ACT nasal spray Commonly known as: NASONEX Place 2 sprays into the nose daily as needed  (allergies).   ondansetron 4 MG disintegrating tablet Commonly known as: ZOFRAN-ODT Take 1 tablet (4 mg total) by mouth every 8 (eight) hours as needed for nausea or vomiting.   oxyCODONE 5 MG immediate release tablet Commonly known as: Oxy IR/ROXICODONE TAKE 1 TABLET BY MOUTH EVERY 4 HOURS AS NEEDED FOR SEVERE PAIN   polyethylene glycol 17 g packet Commonly known as: MIRALAX / GLYCOLAX Take 17 g by mouth daily as needed for mild constipation. What changed:  when to take this reasons to take this   Voltaren 1 % Gel Generic drug: diclofenac Sodium Apply 1 application. topically daily as needed (arthritis pain).        No Known Allergies  You were cared for by a hospitalist during your hospital stay. If you have any questions about your discharge medications or the care you received while you were in the hospital after you are discharged, you can call the unit and asked to speak with the hospitalist on call if the hospitalist that took care of you is not  available. Once you are discharged, your primary care physician will handle any further medical issues. Please note that no refills for any discharge medications will be authorized once you are discharged, as it is imperative that you return to your primary care physician (or establish a relationship with a primary care physician if you do not have one) for your aftercare needs so that they can reassess your need for medications and monitor your lab values.   Procedures/Studies: DG CHEST PORT 1 VIEW  Result Date: 01/29/2022 CLINICAL DATA:  Status post thoracentesis. EXAM: PORTABLE CHEST 1 VIEW COMPARISON:  January 28, 2022. FINDINGS: No pneumothorax is noted status post right-sided thoracentesis. Right pleural effusion is significantly smaller. IMPRESSION: No definite pneumothorax status post right-sided thoracentesis. Electronically Signed   By: Marijo Conception M.D.   On: 01/29/2022 10:43   CT ABDOMEN PELVIS W CONTRAST  Result Date:  01/28/2022 CLINICAL DATA:  Acute nonlocalized abdominal pain EXAM: CT ABDOMEN AND PELVIS WITH CONTRAST TECHNIQUE: Multidetector CT imaging of the abdomen and pelvis was performed using the standard protocol following bolus administration of intravenous contrast. RADIATION DOSE REDUCTION: This exam was performed according to the departmental dose-optimization program which includes automated exposure control, adjustment of the mA and/or kV according to patient size and/or use of iterative reconstruction technique. CONTRAST:  121m OMNIPAQUE IOHEXOL 350 MG/ML SOLN COMPARISON:  Radiographs 12/22/2021 and CT 12/20/2021 FINDINGS: Lower chest: Right pleural effusion and associated atelectasis/consolidation. See separate report from same day CTA chest for additional findings in the chest. Hepatobiliary: Multifocal gallbladder wall thickening and nodularity with nodules measuring up to 8 mm in diameter. For example, series 2/image 42 and series 2/image 40. No focal hepatic lesion. No biliary ductal dilation. Pancreas: Unremarkable. No pancreatic ductal dilatation or surrounding inflammatory changes. Spleen: Normal in size without focal abnormality. Adrenals/Urinary Tract: Adrenal glands are unremarkable. Kidneys are normal, without renal calculi, focal lesion, or hydronephrosis. Bladder is unremarkable. Stomach/Bowel: Stomach is within normal limits. No evidence of bowel wall thickening, distention, or inflammatory changes. The previous dilation of several loops of distal small bowel has. Vascular/Lymphatic: Aortic atherosclerosis. Enhancing rounded right inguinal lymph node is unchanged measuring 15 mm in diameter. Moderate aortoiliac atherosclerotic calcification. No aneurysm. Reproductive: Hysterectomy.  Pessary. Other: Peritoneal nodularity and nodular mesenteric implants scattered throughout the abdomen consistent with metastatic ovarian cancer have increased since 12/20/2021. For example, the soft tissue mass within  the anterior peritoneum within the right lower has increased in size now measuring approximally 4.3 x 2.2 cm in the axial plane, previously 4.0 x 2.2 cm. This is intimately associated with the adjacent colon which may be tethered at this location (series 2/image 51). Musculoskeletal: Increased height loss of the T12 vertebral body (approximately 25%) since 12/20/2021. No retropulsion. Unchanged superior endplate fractures of L2, L3, and L4. IMPRESSION: 1. Increased peritoneal nodularity and mesenteric implants consistent with worsening metastatic ovarian cancer. This includes increased size of the soft tissue mass within the anterior right peritoneum abutting the colon which may be tethered at this point. No evidence of bowel obstruction. 2. Multifocal mural thickening and nodularity in the gallbladder concerning for metastases. 3. Increased height loss of the superior endplate fracture of TL46 Unchanged superior endplate fractures of L2, L3, and L4. 4. Moderate right pleural effusion. See same day CT chest report for further details of the findings in the chest. 5.  Aortic Atherosclerosis (ICD10-I70.0). Electronically Signed   By: TPlacido SouM.D.   On: 01/28/2022 16:50   CT Angio Chest PE  W and/or Wo Contrast  Result Date: 01/28/2022 CLINICAL DATA:  Shortness of breath and chest tightness. History of ovarian cancer. EXAM: CT ANGIOGRAPHY CHEST WITH CONTRAST TECHNIQUE: Multidetector CT imaging of the chest was performed using the standard protocol during bolus administration of intravenous contrast. Multiplanar CT image reconstructions and MIPs were obtained to evaluate the vascular anatomy. RADIATION DOSE REDUCTION: This exam was performed according to the departmental dose-optimization program which includes automated exposure control, adjustment of the mA and/or kV according to patient size and/or use of iterative reconstruction technique. CONTRAST:  148m OMNIPAQUE IOHEXOL 350 MG/ML SOLN COMPARISON:   Chest CT 12/19/2020 FINDINGS: Cardiovascular: Negative for pulmonary embolism. Heart size is normal. Normal caliber of the thoracic aorta. Atherosclerotic calcifications at the aortic arch. Right jugular Port-A-Cath tip is near the superior cavoatrial junction. Mediastinum/Nodes: Heart and mediastinum are slightly displaced toward the left due to large right pleural effusion. Precarinal lymph node measures 8 mm in short axis and previously measured 5 mm. Overall, there is not significant lymph node enlargement in the mediastinum or hilar regions. No axillary lymph node enlargement. Lungs/Pleura: New large right pleural effusion causing near complete collapse of the right lower lobe. Minimal aeration in the right lower lobe. Significant volume loss in the right middle lobe. 4 mm calcified granuloma in the right upper lobe on sequence 6, image 42. Mild interlobular thickening near the right lung apex. Increased volume loss and atelectasis at the left lung base. Stable scarring at the left lung apex. Tiny nodular densities in the left lung are similar to the recent comparison examination examination. Upper Abdomen: Again noted are left renal parapelvic cysts. No acute abnormality of the visualized upper abdomen. Musculoskeletal: New compression fracture along the superior endplate of L1 with mild vertebral body height loss. There is no significant bone retropulsion at this fracture. Review of the MIP images confirms the above findings. IMPRESSION: 1. Large right pleural effusion with near complete collapse of the right lower lobe and significant volume loss in the right middle lobe. Mediastinal shift towards the left due to the large pleural effusion. 2. New compression fracture involving the superior endplate of L1. 3. Negative for a pulmonary embolism. Electronically Signed   By: AMarkus DaftM.D.   On: 01/28/2022 16:34   CT Head Wo Contrast  Result Date: 01/28/2022 CLINICAL DATA:  Headache, sudden, severe EXAM: CT  HEAD WITHOUT CONTRAST TECHNIQUE: Contiguous axial images were obtained from the base of the skull through the vertex without intravenous contrast. RADIATION DOSE REDUCTION: This exam was performed according to the departmental dose-optimization program which includes automated exposure control, adjustment of the mA and/or kV according to patient size and/or use of iterative reconstruction technique. COMPARISON:  10/16/2020 FINDINGS: Brain: No evidence of acute infarction, hemorrhage, mass, mass effect, or midline shift. No hydrocephalus or extra-axial fluid collection. Periventricular white matter changes, likely the sequela of chronic small vessel ischemic disease. Vascular: No hyperdense vessel. Skull: Normal. Negative for fracture or focal lesion. Sinuses/Orbits: No acute finding. Other: The mastoid air cells are well aerated. IMPRESSION: No acute intracranial process. Electronically Signed   By: AMerilyn BabaM.D.   On: 01/28/2022 16:20   DG Chest 2 View  Result Date: 01/28/2022 CLINICAL DATA:  Short of breath EXAM: CHEST - 2 VIEW COMPARISON:  September 2020 FINDINGS: Right chest wall port with catheter overlying the SVC. Right pleural effusion with adjacent atelectasis. Eventration of the left hemidiaphragm. Cardiomediastinal contours are partially obscured. No pneumothorax. IMPRESSION: Moderate right pleural effusion with  adjacent atelectasis. Electronically Signed   By: Macy Mis M.D.   On: 01/28/2022 14:07     The results of significant diagnostics from this hospitalization (including imaging, microbiology, ancillary and laboratory) are listed below for reference.     Microbiology: Recent Results (from the past 240 hour(s))  Body fluid culture w Gram Stain     Status: None (Preliminary result)   Collection Time: 01/29/22 10:13 AM   Specimen: Pleural Fluid  Result Value Ref Range Status   Specimen Description   Final    PLEURAL Performed at [facility]  [address], [place], [place] [address]    Special Requests   Final    NONE Performed at [facility], [place] [address], [place], [place] [address]    Gram Stain PENDING  Incomplete   Culture   Final    NO GROWTH < 24 HOURS Performed at [facility], [place] [address], [place], [place] [address]    Report Status PENDING  Incomplete     Labs: BNP (last 3 results) Recent Labs    01/28/22 1404  BNP 42.5   Basic Metabolic Panel: Recent Labs  Lab 01/28/22 1404 01/29/22 0308 01/30/22 0223  NA 134* 136 137  K 4.6 3.8 3.5  CL 101 103 107  CO2 21* 27 23  GLUCOSE 108* 101* 100*  BUN '13 12 13  '$ CREATININE 0.98 0.94 0.84  CALCIUM 10.0 9.3 8.3*  MG  --  1.5* 2.4  PHOS  --   --  3.4   Liver Function Tests: No results for input(s): "AST", "ALT", "ALKPHOS", "BILITOT", "PROT", "ALBUMIN" in the last 168 hours. No results for input(s): "LIPASE", "AMYLASE" in the last 168 hours. No results for input(s): "AMMONIA" in the last 168 hours. CBC: Recent Labs  Lab 01/28/22 1655 01/29/22 0308 01/30/22 0223  WBC 4.4 3.4* 3.8*  NEUTROABS 2.2  --   --   HGB 10.3* 9.5* 9.5*  HCT 31.5* 28.7* 29.3*  MCV 107.9* 108.3* 109.3*  PLT 196 199 198   Cardiac Enzymes: No results for input(s): "CKTOTAL", "CKMB", "CKMBINDEX", "TROPONINI" in the last 168 hours. BNP: Invalid input(s): "POCBNP" CBG: No results for input(s): "GLUCAP" in the last 168 hours. D-Dimer No results for input(s): "DDIMER" in the last 72 hours. Hgb A1c No results for input(s): "HGBA1C" in the last 72 hours. Lipid Profile No results for input(s): "CHOL", "HDL", "LDLCALC", "TRIG", "CHOLHDL", "LDLDIRECT" in the last 72 hours. Thyroid function studies Recent Labs    01/28/22 1750  TSH 1.737   Anemia work up Recent Labs    01/28/22 1750 01/29/22 0308  VITAMINB12 313  --   FOLATE  --  10.3   Urinalysis    Component Value Date/Time   COLORURINE STRAW (A) 01/28/2022 1423   APPEARANCEUR CLEAR  01/28/2022 1423   LABSPEC 1.008 01/28/2022 1423   PHURINE 8.0 01/28/2022 1423   GLUCOSEU NEGATIVE 01/28/2022 1423   HGBUR NEGATIVE 01/28/2022 1423   BILIRUBINUR NEGATIVE 01/28/2022 1423   KETONESUR 5 (A) 01/28/2022 1423   PROTEINUR NEGATIVE 01/28/2022 1423   UROBILINOGEN 0.2 04/08/2013 2040   NITRITE NEGATIVE 01/28/2022 1423   LEUKOCYTESUR TRACE (A) 01/28/2022 1423   Sepsis Labs Recent Labs  Lab 01/28/22 1655 01/29/22 0308 01/30/22 0223  WBC 4.4 3.4* 3.8*   Microbiology Recent Results (from the past 240 hour(s))  Body fluid culture w Gram Stain     Status: None (Preliminary result)   Collection Time: 01/29/22 10:13 AM  Specimen: Pleural Fluid  Result Value Ref Range Status   Specimen Description   Final    PLEURAL Performed at [facility] [address], [place], [place] [address]    Special Requests   Final    NONE Performed at [facility], [place] [address] [place], [place] [address]    Gram Stain PENDING  Incomplete   Culture   Final    NO GROWTH < 24 HOURS Performed at [facility], [place] [address] [place],  [address]    Report Status PENDING  Incomplete     Time coordinating discharge:  I have spent 35 minutes face to face with the patient and on the ward discussing the patients care, assessment, plan and disposition with other care givers. >50% of the time was devoted counseling the patient about the risks and benefits of treatment/Discharge disposition and coordinating care.   SIGNED:   Damita Lack, MD  [facility] 01/30/2022, 2:22 PM   If 7PM-7AM, please contact night-coverage

## 2022-02-02 ENCOUNTER — Telehealth: Payer: Self-pay

## 2022-02-02 ENCOUNTER — Other Ambulatory Visit: Payer: Self-pay | Admitting: Hematology and Oncology

## 2022-02-02 LAB — BODY FLUID CULTURE W GRAM STAIN: Culture: NO GROWTH

## 2022-02-02 MED ORDER — CIPROFLOXACIN HCL 250 MG PO TABS: 250.0000 mg | ORAL_TABLET | Freq: Two times a day (BID) | ORAL | 0 refills | Status: DC

## 2022-02-02 NOTE — Telephone Encounter (Signed)
She called and left a message. She has been having blood in her urine and back pain. She feels like she has a UTI. She had some left over Doxycycline and is taking the Rx. She is asking if you could send another antibiotic or should she finish the Doxycyline?

## 2022-02-02 NOTE — Telephone Encounter (Signed)
I will send in new antibiotics She was DC from hospital recently Is hospice involved? Does she still want to keep her appt next week?

## 2022-02-02 NOTE — Telephone Encounter (Signed)
Called and told Rx sent to pharmacy for Cipro. She will stop Doxycycline and start Cipro. Hospice is scheduled to come out tomorrow for the first time and explain the program. She wants to keep appt scheduled next week with Dr. Alvy Bimler. She will call the office back if she wants to cancel.

## 2022-02-03 ENCOUNTER — Telehealth: Payer: Self-pay | Admitting: Hematology and Oncology

## 2022-02-03 NOTE — Telephone Encounter (Signed)
Per 8/8 phone line pt called to cancel appointment  pt is under hospice care

## 2022-02-09 ENCOUNTER — Inpatient Hospital Stay: Payer: Medicare Other

## 2022-02-09 ENCOUNTER — Inpatient Hospital Stay: Payer: Medicare Other | Admitting: Hematology and Oncology

## 2022-02-10 ENCOUNTER — Other Ambulatory Visit: Payer: Self-pay | Admitting: Hematology and Oncology

## 2022-02-17 ENCOUNTER — Ambulatory Visit: Payer: Self-pay | Admitting: *Deleted

## 2022-02-17 ENCOUNTER — Encounter: Payer: Self-pay | Admitting: *Deleted

## 2022-02-17 NOTE — Patient Instructions (Signed)
Visit Information  Thank you for taking time to visit with me today. Please don't hesitate to contact me if I can be of assistance to you.   Please call the care guide team at [phone] if you need to cancel or reschedule your appointment.   If you are experiencing a Mental Health or Behavioral Health Crisis or need someone to talk to, please call the Suicide and Crisis Lifeline: 988 call the [place] National Suicide Prevention Lifeline: 1-800-273-8255 or TTY: 1-800-799-4 TTY (1-800-799-4889) to talk to a trained counselor call 1-800-273-TALK (toll free, 24 hour hotline) go to [facility] [address], [place] ([phone]) call the [place] Crisis Line: 800-939-9988 call 911  Patient verbalizes understanding of instructions and care plan provided today and agrees to view in MyChart. Active MyChart status and patient understanding of how to access instructions and care plan via MyChart confirmed with patient.     No further follow up required.  Lyris [place], BSW, MSW, LCSW  Licensed Clinical Social Worker  [facility]  Mailing Address-[address], [place], [place] [address] Physical Address-[address], [place], [place] [address] Toll Free Main # [phone] Fax # [phone] Cell # [phone] [email or web address]            

## 2022-02-17 NOTE — Patient Outreach (Signed)
  Care Coordination   Initial Visit Note   02/17/2022  Name: Paula Peterson MRN: [id number] DOB: March 04, 1941  Paula Peterson is a 81 y.o. year old female who sees Caryl Bis, MD for primary care. I spoke with Paula Peterson by phone today.  What matters to the patients health and wellness today?  No Interventions Indicated.  CSW collaboration with Primary Care Provider, Dr. Gar Ponto to report that patient is now under Hospice care.   Goals Addressed   None     SDOH assessments and interventions completed:  Yes.    SDOH Interventions Today    Flowsheet Row Most Recent Value  SDOH Interventions   Food Insecurity Interventions Intervention Not Indicated  Financial Strain Interventions Intervention Not Indicated  Housing Interventions Intervention Not Indicated  Physical Activity Interventions Intervention Not Indicated  Stress Interventions Intervention Not Indicated  Social Connections Interventions Intervention Not Indicated  Transportation Interventions Intervention Not Indicated        Care Coordination Interventions Activated:  Yes.   Care Coordination Interventions:  Yes, provided.   Follow up plan: No further intervention required.   Encounter Outcome:  Pt. Visit Completed.   Nat Christen, BSW, MSW, LCSW  Licensed Education officer, [facility]  Mailing [place], [place], [place] [address] Physical Address-[address], [place], [place] [address] Toll Free Main # [phone] Fax # [phone] Cell # [phone] [email or web address]

## 2022-02-27 ENCOUNTER — Encounter (HOSPITAL_COMMUNITY): Payer: Self-pay

## 2022-02-27 ENCOUNTER — Emergency Department (HOSPITAL_COMMUNITY): Payer: Medicare Other

## 2022-02-27 ENCOUNTER — Other Ambulatory Visit: Payer: Self-pay

## 2022-02-27 ENCOUNTER — Inpatient Hospital Stay (HOSPITAL_COMMUNITY)
Admission: EM | Admit: 2022-02-27 | Discharge: 2022-03-02 | DRG: 389 | Disposition: A | Discharge status: Hospice | Payer: Medicare Other | Attending: Internal Medicine | Admitting: Internal Medicine

## 2022-02-27 DIAGNOSIS — K56609 Unspecified intestinal obstruction, unspecified as to partial versus complete obstruction: Secondary | ICD-10-CM

## 2022-02-27 DIAGNOSIS — Z66 Do not resuscitate: Secondary | ICD-10-CM | POA: Diagnosis not present

## 2022-02-27 DIAGNOSIS — Z7189 Other specified counseling: Secondary | ICD-10-CM

## 2022-02-27 DIAGNOSIS — C561 Malignant neoplasm of right ovary: Secondary | ICD-10-CM | POA: Diagnosis present

## 2022-02-27 DIAGNOSIS — E669 Obesity, unspecified: Secondary | ICD-10-CM | POA: Diagnosis present

## 2022-02-27 DIAGNOSIS — J910 Malignant pleural effusion: Secondary | ICD-10-CM | POA: Diagnosis present

## 2022-02-27 DIAGNOSIS — Z7901 Long term (current) use of anticoagulants: Secondary | ICD-10-CM | POA: Diagnosis not present

## 2022-02-27 DIAGNOSIS — R52 Pain, unspecified: Secondary | ICD-10-CM | POA: Diagnosis not present

## 2022-02-27 DIAGNOSIS — Z86711 Personal history of pulmonary embolism: Secondary | ICD-10-CM

## 2022-02-27 DIAGNOSIS — J90 Pleural effusion, not elsewhere classified: Secondary | ICD-10-CM | POA: Diagnosis not present

## 2022-02-27 DIAGNOSIS — Z8673 Personal history of transient ischemic attack (TIA), and cerebral infarction without residual deficits: Secondary | ICD-10-CM

## 2022-02-27 DIAGNOSIS — Z86718 Personal history of other venous thrombosis and embolism: Secondary | ICD-10-CM

## 2022-02-27 DIAGNOSIS — Z79890 Hormone replacement therapy (postmenopausal): Secondary | ICD-10-CM | POA: Diagnosis not present

## 2022-02-27 DIAGNOSIS — K56600 Partial intestinal obstruction, unspecified as to cause: Principal | ICD-10-CM | POA: Diagnosis present

## 2022-02-27 DIAGNOSIS — Z6832 Body mass index (BMI) 32.0-32.9, adult: Secondary | ICD-10-CM

## 2022-02-27 DIAGNOSIS — Z515 Encounter for palliative care: Secondary | ICD-10-CM

## 2022-02-27 DIAGNOSIS — E039 Hypothyroidism, unspecified: Secondary | ICD-10-CM | POA: Diagnosis not present

## 2022-02-27 DIAGNOSIS — R0602 Shortness of breath: Secondary | ICD-10-CM | POA: Diagnosis not present

## 2022-02-27 DIAGNOSIS — I700 Atherosclerosis of aorta: Secondary | ICD-10-CM | POA: Diagnosis not present

## 2022-02-27 DIAGNOSIS — I2699 Other pulmonary embolism without acute cor pulmonale: Secondary | ICD-10-CM | POA: Diagnosis not present

## 2022-02-27 DIAGNOSIS — C800 Disseminated malignant neoplasm, unspecified: Secondary | ICD-10-CM | POA: Diagnosis not present

## 2022-02-27 DIAGNOSIS — Z79899 Other long term (current) drug therapy: Secondary | ICD-10-CM | POA: Diagnosis not present

## 2022-02-27 DIAGNOSIS — K219 Gastro-esophageal reflux disease without esophagitis: Secondary | ICD-10-CM | POA: Diagnosis present

## 2022-02-27 DIAGNOSIS — Z800 Family history of malignant neoplasm of digestive organs: Secondary | ICD-10-CM

## 2022-02-27 DIAGNOSIS — K6389 Other specified diseases of intestine: Secondary | ICD-10-CM | POA: Diagnosis not present

## 2022-02-27 DIAGNOSIS — I10 Essential (primary) hypertension: Secondary | ICD-10-CM | POA: Diagnosis present

## 2022-02-27 DIAGNOSIS — R109 Unspecified abdominal pain: Secondary | ICD-10-CM | POA: Diagnosis not present

## 2022-02-27 DIAGNOSIS — G893 Neoplasm related pain (acute) (chronic): Secondary | ICD-10-CM | POA: Diagnosis present

## 2022-02-27 DIAGNOSIS — N3001 Acute cystitis with hematuria: Secondary | ICD-10-CM | POA: Diagnosis not present

## 2022-02-27 LAB — COMPREHENSIVE METABOLIC PANEL
ALT: 18 U/L (ref 0–44)
AST: 18 U/L (ref 15–41)
Albumin: 3.4 g/dL — ABNORMAL LOW (ref 3.5–5.0)
Alkaline Phosphatase: 72 U/L (ref 38–126)
Anion gap: 13 (ref 5–15)
BUN: 26 mg/dL — ABNORMAL HIGH (ref 8–23)
CO2: 25 mmol/L (ref 22–32)
Calcium: 9.9 mg/dL (ref 8.9–10.3)
Chloride: 99 mmol/L (ref 98–111)
Creatinine, Ser: 1.04 mg/dL — ABNORMAL HIGH (ref 0.44–1.00)
GFR, Estimated: 55 mL/min — ABNORMAL LOW (ref >60)
Glucose, Bld: 147 mg/dL — ABNORMAL HIGH (ref 70–99)
Potassium: 3.5 mmol/L (ref 3.5–5.1)
Sodium: 137 mmol/L (ref 135–145)
Total Bilirubin: 0.8 mg/dL (ref 0.3–1.2)
Total Protein: 6.5 g/dL (ref 6.5–8.1)

## 2022-02-27 LAB — URINALYSIS, ROUTINE W REFLEX MICROSCOPIC
Bilirubin Urine: NEGATIVE
Glucose, UA: NEGATIVE mg/dL
Ketones, ur: NEGATIVE mg/dL
Nitrite: NEGATIVE
Protein, ur: NEGATIVE mg/dL
Specific Gravity, Urine: 1.033 — ABNORMAL HIGH (ref 1.005–1.030)
pH: 7 (ref 5.0–8.0)

## 2022-02-27 LAB — CBC WITH DIFFERENTIAL/PLATELET
Abs Immature Granulocytes: 0.04 10*3/uL (ref 0.00–0.07)
Basophils Absolute: 0 10*3/uL (ref 0.0–0.1)
Basophils Relative: 0 %
Eosinophils Absolute: 0 10*3/uL (ref 0.0–0.5)
Eosinophils Relative: 0 %
HCT: 39.5 % (ref 36.0–46.0)
Hemoglobin: 13 g/dL (ref 12.0–15.0)
Immature Granulocytes: 0 %
Lymphocytes Relative: 18 %
Lymphs Abs: 2 10*3/uL (ref 0.7–4.0)
MCH: 34.4 pg — ABNORMAL HIGH (ref 26.0–34.0)
MCHC: 32.9 g/dL (ref 30.0–36.0)
MCV: 104.5 fL — ABNORMAL HIGH (ref 80.0–100.0)
Monocytes Absolute: 0.8 10*3/uL (ref 0.1–1.0)
Monocytes Relative: 7 %
Neutro Abs: 8.1 10*3/uL — ABNORMAL HIGH (ref 1.7–7.7)
Neutrophils Relative %: 75 %
Platelets: 174 10*3/uL (ref 150–400)
RBC: 3.78 MIL/uL — ABNORMAL LOW (ref 3.87–5.11)
RDW: 14.9 % (ref 11.5–15.5)
WBC: 10.9 10*3/uL — ABNORMAL HIGH (ref 4.0–10.5)
nRBC: 0.2 % (ref 0.0–0.2)

## 2022-02-27 LAB — LIPASE, BLOOD: Lipase: 24 U/L (ref 11–51)

## 2022-02-27 MED ORDER — HYDROMORPHONE HCL 2 MG/ML IJ SOLN
1.0000 mg | Freq: Once | INTRAMUSCULAR | Status: AC
  Administered 2022-02-27: 1 mg via INTRAVENOUS
  Filled 2022-02-27: qty 1

## 2022-02-27 MED ORDER — PROCHLORPERAZINE EDISYLATE 10 MG/2ML IJ SOLN
10.0000 mg | Freq: Four times a day (QID) | INTRAMUSCULAR | Status: DC | PRN
  Administered 2022-02-27: 10 mg via INTRAVENOUS
  Filled 2022-02-27: qty 2

## 2022-02-27 MED ORDER — LEVOTHYROXINE SODIUM 75 MCG PO TABS
75.0000 ug | ORAL_TABLET | Freq: Every day | ORAL | Status: DC
  Administered 2022-02-28 – 2022-03-02 (×3): 75 ug via ORAL
  Filled 2022-02-27 (×3): qty 1

## 2022-02-27 MED ORDER — BIOTENE DRY MOUTH MT LIQD: 15.0000 mL | OROMUCOSAL | Status: DC | PRN

## 2022-02-27 MED ORDER — ATENOLOL 25 MG PO TABS
25.0000 mg | ORAL_TABLET | Freq: Every day | ORAL | Status: DC
  Administered 2022-02-28 – 2022-03-02 (×3): 25 mg via ORAL
  Filled 2022-02-27 (×3): qty 1

## 2022-02-27 MED ORDER — SODIUM CHLORIDE 0.9 % IV BOLUS
1000.0000 mL | Freq: Once | INTRAVENOUS | Status: AC
  Administered 2022-02-27: 1000 mL via INTRAVENOUS

## 2022-02-27 MED ORDER — SODIUM CHLORIDE 0.9% FLUSH
10.0000 mL | INTRAVENOUS | Status: DC | PRN
  Administered 2022-03-02: 10 mL

## 2022-02-27 MED ORDER — HYDROMORPHONE HCL 2 MG/ML IJ SOLN
1.0000 mg | INTRAMUSCULAR | Status: DC | PRN
  Administered 2022-02-27 – 2022-02-28 (×3): 1 mg via INTRAVENOUS
  Filled 2022-02-27 (×3): qty 1

## 2022-02-27 MED ORDER — POLYVINYL ALCOHOL 1.4 % OP SOLN: 1.0000 [drp] | Freq: Four times a day (QID) | OPHTHALMIC | Status: DC | PRN

## 2022-02-27 MED ORDER — FENTANYL 25 MCG/HR TD PT72
1.0000 | MEDICATED_PATCH | TRANSDERMAL | Status: DC
  Administered 2022-02-27: 1 via TRANSDERMAL
  Filled 2022-02-27: qty 1

## 2022-02-27 MED ORDER — DEXAMETHASONE 4 MG PO TABS
4.0000 mg | ORAL_TABLET | Freq: Every day | ORAL | Status: DC
  Administered 2022-02-27 – 2022-03-02 (×4): 4 mg via ORAL
  Filled 2022-02-27 (×4): qty 1

## 2022-02-27 MED ORDER — CHLORHEXIDINE GLUCONATE CLOTH 2 % EX PADS
6.0000 | MEDICATED_PAD | Freq: Every day | CUTANEOUS | Status: DC
  Administered 2022-02-27 – 2022-03-02 (×4): 6 via TOPICAL

## 2022-02-27 MED ORDER — ONDANSETRON HCL 4 MG/2ML IJ SOLN
4.0000 mg | Freq: Once | INTRAMUSCULAR | Status: AC
  Administered 2022-02-27: 4 mg via INTRAVENOUS
  Filled 2022-02-27: qty 2

## 2022-02-27 MED ORDER — SODIUM CHLORIDE (PF) 0.9 % IJ SOLN
INTRAMUSCULAR | Status: AC
  Filled 2022-02-27: qty 50

## 2022-02-27 MED ORDER — APIXABAN 5 MG PO TABS
5.0000 mg | ORAL_TABLET | Freq: Two times a day (BID) | ORAL | Status: DC
  Administered 2022-02-27 – 2022-02-28 (×3): 5 mg via ORAL
  Filled 2022-02-27 (×3): qty 1

## 2022-02-27 MED ORDER — SODIUM CHLORIDE 0.9 % IV SOLN: INTRAVENOUS | Status: DC

## 2022-02-27 MED ORDER — LORAZEPAM 2 MG/ML IJ SOLN
1.0000 mg | Freq: Four times a day (QID) | INTRAMUSCULAR | Status: DC | PRN
  Administered 2022-02-27 – 2022-03-01 (×3): 1 mg via INTRAVENOUS
  Filled 2022-02-27 (×3): qty 1

## 2022-02-27 MED ORDER — IOHEXOL 300 MG/ML  SOLN
100.0000 mL | Freq: Once | INTRAMUSCULAR | Status: AC | PRN
  Administered 2022-02-27: 100 mL via INTRAVENOUS

## 2022-02-27 MED ORDER — FENTANYL 25 MCG/HR TD PT72: 1.0000 | MEDICATED_PATCH | TRANSDERMAL | Status: DC

## 2022-02-27 MED ORDER — BISACODYL 5 MG PO TBEC
5.0000 mg | DELAYED_RELEASE_TABLET | Freq: Every day | ORAL | Status: DC
  Administered 2022-02-27 – 2022-02-28 (×2): 5 mg via ORAL
  Filled 2022-02-27 (×2): qty 1

## 2022-02-27 NOTE — ED Triage Notes (Signed)
Pt reports with abdominal pain and vomiting since 6 pm, daughter states that she has multiple cancers. Pt is on hospice and they have been called every 2 hrs with no relief with their interventions. Pt has taken medication for pain but it is not working.   0030 Oxycodone (5 mg) 2330 ativan (2.5 mg) 2120 Zofran (4 mg)

## 2022-02-27 NOTE — Progress Notes (Signed)
Transdermal fentanyl patch present on pt's back upon admission.  Pharmacy notified.  Patch due to be changed tonight at 2000.

## 2022-02-27 NOTE — ED Provider Notes (Signed)
[facility] DEPT Provider Note   CSN: [id number] Arrival date & time: 02/27/22  0231     History  Chief Complaint  Patient presents with   Abdominal Pain   Emesis    Paula Peterson is a 81 y.o. female.  Patient is a 81 year old female with past medical history of metastatic ovarian cancer, prior pulmonary embolism, hypertension, chronic renal insufficiency.  Patient presenting today for evaluation of abdominal pain.  This started earlier this evening at approximately 630 and has rapidly worsened.  She began vomiting at approximately 9:00 and has vomited multiple times.  She has had small bowel obstructions in the past, however this feels different.  She denies any bloody stool or vomit.  She denies any fevers or chills.  She has been taking oxycodone at home with no relief.  The history is provided by the patient.  Emesis      Home Medications Prior to Admission medications   Medication Sig Start Date End Date Taking? Authorizing Provider  acetaminophen (TYLENOL) 500 MG tablet Take 1,000 mg by mouth every 6 (six) hours as needed for mild pain or fever.   Yes [provider]  apixaban (ELIQUIS) 5 MG TABS tablet Take 1 tablet (5 mg total) by mouth 2 (two) times daily. 10/01/21  Yes Gorsuch, Ni, MD  atenolol (TENORMIN) 25 MG tablet TAKE ONE (1) TABLET BY MOUTH EVERY DAY Patient taking differently: Take 25 mg by mouth daily. 01/26/22  Yes Gorsuch, Ni, MD  bisacodyl (DULCOLAX) 5 MG EC tablet Take 1 tablet (5 mg total) by mouth daily as needed for moderate constipation. 01/30/22  Yes Amin, Jeanella Flattery, MD  fentaNYL (DURAGESIC) 25 MCG/HR Place 1 patch onto the skin every 3 (three) days. 02/02/22  Yes Amin, Ankit Chirag, MD  LORazepam (ATIVAN) 0.5 MG tablet Take 0.5-1 mg by mouth every 4 (four) hours as needed for anxiety (for nausea). 02/18/22  Yes [provider]  ondansetron (ZOFRAN-ODT) 4 MG disintegrating tablet Take 1 tablet (4 mg total) by  mouth every 8 (eight) hours as needed for nausea or vomiting. 01/30/22  Yes Amin, Ankit Chirag, MD  oxyCODONE (OXY IR/ROXICODONE) 5 MG immediate release tablet TAKE 1 TABLET BY MOUTH EVERY 4 HOURS AS NEEDED FOR SEVERE PAIN 01/20/22  Yes Gorsuch, Ni, MD  polyethylene glycol (MIRALAX / GLYCOLAX) 17 g packet Take 17 g by mouth daily as needed for mild constipation. 01/30/22  Yes Amin, Jeanella Flattery, MD  anti-nausea (EMETROL) solution Take 10 mLs by mouth every 15 (fifteen) minutes as needed for nausea or vomiting.    [provider]  ciprofloxacin (CIPRO) 250 MG tablet Take 1 tablet (250 mg total) by mouth 2 (two) times daily. Patient not taking: Reported on 02/27/2022 02/02/22   [name], MD  dexamethasone (DECADRON) 4 MG tablet Take 1 tablet (4 mg total) by mouth daily. 01/31/22   Amin, Jeanella Flattery, MD  diclofenac Sodium (VOLTAREN) 1 % GEL Apply 1 application. topically daily as needed (arthritis pain).    [provider]  docusate sodium (COLACE) 100 MG capsule Take 1 capsule (100 mg total) by mouth 2 (two) times daily. 01/30/22   Amin, Jeanella Flattery, MD  levothyroxine (SYNTHROID) 75 MCG tablet Take 1 tablet (75 mcg total) by mouth daily before breakfast. 07/18/20   Denton Brick, Courage, MD  lidocaine-prilocaine (EMLA) cream Apply 1 application topically daily as needed (port access). 07/22/21   [name], MD  mometasone (NASONEX) 50 MCG/ACT nasal spray Place 2 sprays into  the nose daily as needed (allergies).    [provider]      Allergies    Patient has no known allergies.    Review of Systems   Review of Systems  Gastrointestinal:  Positive for vomiting.  All other systems reviewed and are negative.   Physical Exam Updated Vital Signs BP (!) 149/86   Pulse 70   Temp 98.1 F (36.7 C) (Axillary)   Resp 16   Ht '5\' 7"'$  (1.702 m)   Wt 95.3 kg   SpO2 94%   BMI 32.89 kg/m  Physical Exam Vitals and nursing note reviewed.  Constitutional:      General: She is not in  acute distress.    Appearance: She is well-developed. She is not diaphoretic.  HENT:     Head: Normocephalic and atraumatic.  Cardiovascular:     Rate and Rhythm: Normal rate and regular rhythm.     Heart sounds: No murmur heard.    No friction rub. No gallop.  Pulmonary:     Effort: Pulmonary effort is normal. No respiratory distress.     Breath sounds: Normal breath sounds. No wheezing.  Abdominal:     General: Bowel sounds are normal. There is no distension.     Palpations: Abdomen is soft.     Tenderness: There is generalized abdominal tenderness. There is no right CVA tenderness, left CVA tenderness, guarding or rebound.  Musculoskeletal:        General: Normal range of motion.     Cervical back: Normal range of motion and neck supple.  Skin:    General: Skin is warm and dry.  Neurological:     General: No focal deficit present.     Mental Status: She is alert and oriented to person, place, and time.     ED Results / Procedures / Treatments   Labs (all labs ordered are listed, but only abnormal results are displayed) Labs Reviewed - No data to display  EKG None  Radiology No results found.  Procedures Procedures    Medications Ordered in ED Medications  sodium chloride 0.9 % bolus 1,000 mL (has no administration in time range)  ondansetron (ZOFRAN) injection 4 mg (has no administration in time range)  HYDROmorphone (DILAUDID) injection 1 mg (has no administration in time range)    ED Course/ Medical Decision Making/ A&P  This patient presents to the ED for concern of abdominal pain, this involves an extensive number of treatment options, and is a complaint that carries with it a high risk of complications and morbidity.  The differential diagnosis includes small bowel obstruction, perforated viscus, pancreatitis, cholecystitis   Co morbidities that complicate the patient evaluation  History of metastatic ovarian cancer on hospice   Additional history  obtained:  Additional history obtained from daughter at bedside No external records needed   Lab Tests:  I Ordered, and personally interpreted labs.  The pertinent results include: Unremarkable CBC, metabolic panel, and lipase   Imaging Studies ordered:  I ordered imaging studies including CT scan of the abdomen and pelvis I independently visualized and interpreted imaging which showed small bowel obstruction with transition point in the right lower quadrant of the abdomen. I agree with the radiologist interpretation   Cardiac Monitoring: / EKG:  None performed   Consultations Obtained:  I requested consultation with the hospitalist,  and discussed lab and imaging findings as well as pertinent plan - they recommend: Admission for hydration and pain and nausea control  Problem List / ED Course / Critical interventions / Medication management  Patient with history of metastatic ovarian cancer presenting with severe abdominal pain and vomiting caused by a small bowel obstruction.  Patient has had this on 2 prior occasions.  She was given IV fluids along with medicine for pain and nausea.  She is declining NG tube.  She will be admitted to the hospitalist service for further care. I ordered medication including Dilaudid for pain and Zofran for nausea Reevaluation of the patient after these medicines showed that the patient improved I have reviewed the patients home medicines and have made adjustments as needed   Social Determinants of Health:  None   Test / Admission - Considered:  Patient with small bowel obstruction to be admitted for pain control and hydration.  Final Clinical Impression(s) / ED Diagnoses Final diagnoses:  None    Rx / DC Orders ED Discharge Orders     None         Veryl Speak, MD 02/27/22 0630

## 2022-02-27 NOTE — Consult Note (Signed)
Consultation Note Date: 02/27/2022   Patient Name: Paula Peterson  DOB: 02/06/1941  MRN: [id number]  Age / Sex: 81 y.o., female  PCP: Caryl Bis, MD Referring Physician: Jonnie Finner, DO  Reason for Consultation: Establishing goals of care  HPI/Patient Profile: 81 y.o. 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  Patient stopped chemotherapy and has been hospitalized intermittently over the past 6 months for partial bowel obstruction due to her malignancy.   Clinical Assessment and Goals of Care: I have reviewed medical records including EPIC notes, labs and imaging, assessed the patient and then met with patient and her daughter Paula Peterson to discuss diagnosis prognosis, GOC, EOL wishes, disposition and options.  I introduced Palliative Medicine as specialized medical care for people living with serious illness. It focuses on providing relief from the symptoms and stress of a serious illness. The goal is to improve quality of life for both the patient and the family.  Patient is known to PMT as we saw her in her July admission.  Patient is being followed closely by [facility] [place] with home hospice services.  I attempted to elicit values and goals of care important to the patient.  Patient and daughter are in agreement with focusing on comfort, controlling her symptoms, and preparing medications to be adjusted so that patient can return home to continue with hospice care. Pt verbalized she does NOT want an NG tube, surgical intervention, or placement of a venting Gtube. She wants to be  kept comfortable and minimize pain/nausea.   We reviewed that comfort measures mean DNR/DNI remains, IVFs remain (at dtr's request), no more blood draws/procedures, but VS once daily. Focus is on aggressive symptoms management and liberating patient's diet to small bites and sips of comfort foods.   Patient's pain and nausea are appropriately controlled with current regimen. We discussed that patient's fentanyl patch needs to be changed tomorrow and that decadron 64m PO QD can be restarted tomorrow.   PMT will continue to follow the patient throughout her hospitalization. Comfort measures and DNR remain.   Questions and concerns were addressed. The family was encouraged to call with questions or concerns.   Primary Decision Maker PATIENT  Code Status/Advance Care Planning: DNR  Prognosis:   < 6 months  Discharge Planning: Home with Hospice  Primary Diagnoses: Present on Admission:  SBO (small bowel obstruction) ([place]  Acquired hypothyroidism  Essential hypertension  PE (pulmonary thromboembolism) ([place]  Right ovarian epithelial cancer ([place]   Physical Exam Vitals reviewed.  Constitutional:      General: She is not in acute distress.    Appearance: She is well-developed. She is not toxic-appearing.  HENT:     Head: Normocephalic.  Cardiovascular:     Rate and  Rhythm: Normal rate.     Heart sounds: Normal heart sounds.  Abdominal:     General: Abdomen is flat.     Palpations: Abdomen is soft.  Skin:    General: Skin is warm and dry.  Neurological:     Mental Status: She is alert and oriented to person, place, and time.  Psychiatric:        Mood and Affect: Mood normal. Mood is not anxious or depressed.        Behavior: Behavior normal.     Palliative Assessment/Data: 50%     Thank you for this consult. Palliative medicine will continue to follow and assist holistically.   Time Total: 75 minutes Greater than 50%  of this time was spent counseling and  coordinating care related to the above assessment and plan.  Signed by: Jordan Hawks, DNP, FNP-BC Palliative Medicine    Please contact Palliative Medicine Team phone at [phone] for questions and concerns.  For individual provider: See Shea Evans

## 2022-02-27 NOTE — Consult Note (Signed)
Reason for Consult:Malignant small bowel obstruction Referring Physician: Marylyn Ishihara [facility]  Paula Peterson is an 81 y.o. female.  HPI: This is a 81 year old female with history of ovarian cancer status post exploratory laparotomy with right salpingo-oophorectomy, omentectomy and radical tumor debulking in 2020.  The patient has peritoneal carcinomatosis.  Recently her chemotherapy was stopped.  The patient is chronically anticoagulated for on Eliquis for PE/DVT.  She has been hospitalized intermittently over the last few months with partial small bowel obstruction secondary to her malignancy.  The patient has daily bowel movements with the assistance of Dulcolax.  She had a bowel movement yesterday.  The patient has been working with hospice on outpatient basis.  She presents now with a 1 day history of abdominal pain, nausea, vomiting.  No sign of hematemesis.  No bowel movements today.  The patient has not vomited since arriving in the emergency department.  She is refusing placement of NG tube.  Apparently her last hospitalization had a fairly traumatic NG tube insertion experience.  CT scan today shows partial small bowel obstruction with a transition point in the right lower quadrant.  She has evidence of peritoneal carcinomatosis.  She has a moderate to large right pleural effusion.  Past Medical History:  Diagnosis Date   Back pain    DVT (deep venous thrombosis) ([place]) 02/02/2019   had DVT first that moved into lung   GERD (gastroesophageal reflux disease)    History of pulmonary embolus (PE) 02/02/2019   confirmed by CT chest- had DVT first that moved into lungs per patient   Hypertension    Ovarian cancer ([place])    Pessary maintenance    Stroke [facility])     Past Surgical History:  Procedure Laterality Date   ABDOMINAL HYSTERECTOMY     ovaries left   BACK SURGERY     DEBULKING Bilateral 06/06/2019   Procedure: RADICAL TUMOR DEBULKING;  Surgeon: Everitt Amber, MD;  Location: [facility];  Service:  Gynecology;  Laterality: Bilateral;   FRACTURE SURGERY     IR IMAGING GUIDED PORT INSERTION  12/15/2018   OMENTECTOMY Bilateral 06/06/2019   Procedure: OMENTECTOMY;  Surgeon: Everitt Amber, MD;  Location: [facility];  Service: Gynecology;  Laterality: Bilateral;   SALPINGOOPHORECTOMY Bilateral 06/06/2019   Procedure: EXPLORATORY LAPAROTOMY,OPEN RIGHT SALPINGO OOPHORECTOMY;  Surgeon: Everitt Amber, MD;  Location: [facility];  Service: Gynecology;  Laterality: Bilateral;    Family History  Problem Relation Age of Onset   Stomach cancer Sister        dx 53s-50s   Dementia Mother    Cancer Sister        unk type, hysterectomy, dx 43s    Social History:  reports that she has never smoked. She has never been exposed to tobacco smoke. She has never used smokeless tobacco. She reports that she does not drink alcohol and does not use drugs.  Allergies: No Known Allergies  Medications:  Prior to Admission medications   Medication Sig Start Date End Date Taking? Authorizing Provider  acetaminophen (TYLENOL) 500 MG tablet Take 1,000 mg by mouth every 6 (six) hours as needed for mild pain or fever.   Yes [provider]  anti-nausea (EMETROL) solution Take 10 mLs by mouth every 15 (fifteen) minutes as needed for nausea or vomiting.   Yes [provider]  apixaban (ELIQUIS) 5 MG TABS tablet Take 1 tablet (5 mg total) by mouth 2 (two) times daily. 10/01/21  Yes [name], MD  atenolol (TENORMIN) 25 MG  tablet TAKE ONE (1) TABLET BY MOUTH EVERY DAY Patient taking differently: Take 25 mg by mouth daily. 01/26/22  Yes Gorsuch, Ni, MD  bisacodyl (DULCOLAX) 5 MG EC tablet Take 1 tablet (5 mg total) by mouth daily as needed for moderate constipation. Patient taking differently: Take 5-10 mg by mouth daily. 01/30/22  Yes Amin, Ankit Chirag, MD  dexamethasone (DECADRON) 4 MG tablet Take 1 tablet (4 mg total) by mouth daily. 01/31/22  Yes Amin, Ankit Chirag, MD  diclofenac Sodium (VOLTAREN) 1 % GEL Apply 1  application. topically daily as needed (arthritis pain).   Yes [provider]  fentaNYL (DURAGESIC) 25 MCG/HR Place 1 patch onto the skin every 3 (three) days. 02/02/22  Yes Amin, Jeanella Flattery, MD  levothyroxine (SYNTHROID) 75 MCG tablet Take 1 tablet (75 mcg total) by mouth daily before breakfast. 07/18/20  Yes Emokpae, Courage, MD  lidocaine-prilocaine (EMLA) cream Apply 1 application topically daily as needed (port access). 07/22/21  Yes Gorsuch, Ni, MD  LORazepam (ATIVAN) 0.5 MG tablet Take 0.5-1 mg by mouth every 4 (four) hours as needed for anxiety (for nausea). 02/18/22  Yes [provider]  mometasone (NASONEX) 50 MCG/ACT nasal spray Place 2 sprays into the nose daily as needed (allergies).   Yes [provider]  ondansetron (ZOFRAN-ODT) 4 MG disintegrating tablet Take 1 tablet (4 mg total) by mouth every 8 (eight) hours as needed for nausea or vomiting. 01/30/22  Yes Amin, Ankit Chirag, MD  oxyCODONE (OXY IR/ROXICODONE) 5 MG immediate release tablet TAKE 1 TABLET BY MOUTH EVERY 4 HOURS AS NEEDED FOR SEVERE PAIN 01/20/22  Yes Gorsuch, Ni, MD  polyethylene glycol (MIRALAX / GLYCOLAX) 17 g packet Take 17 g by mouth daily as needed for mild constipation. 01/30/22  Yes Amin, Jeanella Flattery, MD  ciprofloxacin (CIPRO) 250 MG tablet Take 1 tablet (250 mg total) by mouth 2 (two) times daily. Patient not taking: Reported on 02/27/2022 02/02/22   [name], MD     Results for orders placed or performed during the hospital encounter of 02/27/22 (from the past 48 hour(s))  Comprehensive metabolic panel     Status: Abnormal   Collection Time: 02/27/22  4:15 AM  Result Value Ref Range   Sodium 137 135 - 145 mmol/L   Potassium 3.5 3.5 - 5.1 mmol/L   Chloride 99 98 - 111 mmol/L   CO2 25 22 - 32 mmol/L   Glucose, Bld 147 (H) 70 - 99 mg/dL    Comment: Glucose reference range applies only to samples taken after fasting for at least 8 hours.   BUN 26 (H) 8 - 23 mg/dL   Creatinine, Ser 1.04  (H) 0.44 - 1.00 mg/dL   Calcium 9.9 8.9 - 10.3 mg/dL   Total Protein 6.5 6.5 - 8.1 g/dL   Albumin 3.4 (L) 3.5 - 5.0 g/dL   AST 18 15 - 41 U/L   ALT 18 0 - 44 U/L   Alkaline Phosphatase 72 38 - 126 U/L   Total Bilirubin 0.8 0.3 - 1.2 mg/dL   GFR, Estimated 55 (L) >60 mL/min    Comment: (NOTE) Calculated using the CKD-EPI Creatinine Equation (2021)    Anion gap 13 5 - 15    Comment: Performed at [facility], [place] [address], [place], [place] [address]  Lipase, blood     Status: None   Collection Time: 02/27/22  4:15 AM  Result Value Ref Range   Lipase 24 11 - 51 U/L  Comment: Performed at [facility], [place] [address] [place], [place] [address]  CBC with Differential     Status: Abnormal   Collection Time: 02/27/22  4:15 AM  Result Value Ref Range   WBC 10.9 (H) 4.0 - 10.5 K/uL   RBC 3.78 (L) 3.87 - 5.11 MIL/uL   Hemoglobin 13.0 12.0 - 15.0 g/dL   HCT 39.5 36.0 - 46.0 %   MCV 104.5 (H) 80.0 - 100.0 fL   MCH 34.4 (H) 26.0 - 34.0 pg   MCHC 32.9 30.0 - 36.0 g/dL   RDW 14.9 11.5 - 15.5 %   Platelets 174 150 - 400 K/uL   nRBC 0.2 0.0 - 0.2 %   Neutrophils Relative % 75 %   Neutro Abs 8.1 (H) 1.7 - 7.7 K/uL   Lymphocytes Relative 18 %   Lymphs Abs 2.0 0.7 - 4.0 K/uL   Monocytes Relative 7 %   Monocytes Absolute 0.8 0.1 - 1.0 K/uL   Eosinophils Relative 0 %   Eosinophils Absolute 0.0 0.0 - 0.5 K/uL   Basophils Relative 0 %   Basophils Absolute 0.0 0.0 - 0.1 K/uL   Immature Granulocytes 0 %   Abs Immature Granulocytes 0.04 0.00 - 0.07 K/uL    Comment: Performed at [facility], [place] [address], [place], [place] [address]  Urinalysis, Routine w reflex microscopic Urine, Clean Catch     Status: Abnormal   Collection Time: 02/27/22  6:19 AM  Result Value Ref Range   Color, Urine YELLOW YELLOW   APPearance CLEAR CLEAR   Specific Gravity, Urine 1.033 (H) 1.005 - 1.030   pH 7.0 5.0 - 8.0   Glucose, UA NEGATIVE NEGATIVE  mg/dL   Hgb urine dipstick MODERATE (A) NEGATIVE   Bilirubin Urine NEGATIVE NEGATIVE   Ketones, ur NEGATIVE NEGATIVE mg/dL   Protein, ur NEGATIVE NEGATIVE mg/dL   Nitrite NEGATIVE NEGATIVE   Leukocytes,Ua MODERATE (A) NEGATIVE   RBC / HPF 11-20 0 - 5 RBC/hpf   WBC, UA 11-20 0 - 5 WBC/hpf   Bacteria, UA RARE (A) NONE SEEN   Squamous Epithelial / LPF 0-5 0 - 5   Mucus PRESENT     Comment: Performed at [facility], [place] [address] [place], [place] [address]   CLINICAL DATA:  Acute abdominal pain. History of ovarian cancer. Status post bilateral omentectomy and bilateral salpingo oophorectomy.   EXAM: CT ABDOMEN AND PELVIS WITH CONTRAST   TECHNIQUE: Multidetector CT imaging of the abdomen and pelvis was performed using the standard protocol following bolus administration of intravenous contrast.   RADIATION DOSE REDUCTION: This exam was performed according to the departmental dose-optimization program which includes automated exposure control, adjustment of the mA and/or kV according to patient size and/or use of iterative reconstruction technique.    CONTRAST:  11m OMNIPAQUE IOHEXOL 300 MG/ML  SOLN   COMPARISON:  01/28/2022   FINDINGS: Lower chest: Moderate to large right pleural effusion with atelectasis of the right lower lobe and right middle lobe. This is similar to the previous exam.   Hepatobiliary: Small area of low attenuation within the inferior right hepatic lobe is too small to characterize measuring 7 mm. Peritoneal metastasis with studding of the liver surface is again noted and appears similar to the previous exam. Progressive peritoneal implants along the surface of the gallbladder are also again seen. Index nodule measures 9 mm, image 44/2. Previously 8 mm. No signs of gallbladder wall inflammation. No bile duct dilatation.  Pancreas: Unremarkable. No pancreatic ductal dilatation or surrounding inflammatory changes.   Spleen:  Normal in size without focal abnormality.   Adrenals/Urinary Tract: Normal adrenal glands. No kidney mass or hydronephrosis. Urinary bladder is unremarkable.   Stomach/Bowel: Stomach appears normal. Again noted is mild fluid dilatation scattered small bowel loops which measure up to 2.7 cm. Transition point is in the right lower quadrant of the abdomen, similar to the previous exam, image 73/2. Imaging findings remain concerning for partial small bowel obstruction. No pathologic dilatation of the colon.   Vascular/Lymphatic: Aortic atherosclerosis without aneurysm. No abdominopelvic adenopathy.   Reproductive: Status post hysterectomy. Pessary device identified within the vaginal apex.   Other: There is a small volume of ascites within the dependent portion of the pelvis. Peritoneal nodularity is again noted compatible with peritoneal carcinomatosis.   -Soft tissue implant within the ventral right lower quadrant of the abdomen measures 1.8 x 4.0 cm, unchanged from the previous exam, image 54/2.   -Left abdominal peritoneal implant measures 0.9 cm, image 49/2. Previously this measured the same.   -Adjacent nodule measures 1.5 x 1.0 cm, image 51/2. Previously 1.0 x 0.7 cm.   -Nodule in the left upper quadrant of the abdomen measures 0.7 cm, image 30/2. Unchanged from previous exam.   Musculoskeletal: No acute or significant osseous findings.   IMPRESSION: 1. Similar appearance of partial small bowel obstruction with transition point in the right lower quadrant of the abdomen. The degree of small-bowel distention appears unchanged from previous exam. 2. Similar appearance of peritoneal carcinomatosis. 3. Moderate to large right pleural effusion with atelectasis of the right lower lobe and right middle lobe. Unchanged from previous exam. 4. Aortic Atherosclerosis (ICD10-I70.0).     Electronically Signed   By: Kerby Moors M.D.   On: 02/27/2022 06:11  Review of  Systems  Constitutional:  Negative for chills, fever and unexpected weight change.  HENT:  Negative for congestion, hearing loss, sore throat, trouble swallowing and voice change.   Eyes:  Negative for visual disturbance.  Respiratory:  Negative for cough and wheezing.   Cardiovascular:  Negative for chest pain, palpitations and leg swelling.  Gastrointestinal:  Positive for abdominal pain, nausea and vomiting. Negative for abdominal distention, anal bleeding, blood in stool, constipation and diarrhea.  Genitourinary:  Negative for difficulty urinating, hematuria and vaginal bleeding.  Musculoskeletal:  Negative for arthralgias.  Skin:  Negative for rash and wound.  Neurological:  Negative for seizures, syncope and headaches.  Hematological:  Negative for adenopathy. Does not bruise/bleed easily.  Psychiatric/Behavioral:  Negative for confusion.    Blood pressure (!) 104/94, pulse 73, temperature (!) 97.5 F (36.4 C), temperature source Axillary, resp. rate (!) 9, height '5\' 7"'$  (1.702 m), weight 95.3 kg, SpO2 93 %. Physical Exam Ill-appearing female - resting comfortably; daughter at bedside Abd - mildly distended, diffusely tender but no generalized peritonitis  Assessment/Plan: Partial small bowel obstruction - secondary to peritoneal carcinomatosis  If the patient begins to have nausea/ vomiting again, would convince her to allow placement of a NG tube.  Surgical intervention would be high risk and would involve placement of a gastrostomy tube to vent the stomach.  No indications for emergent surgery at this time. Discussed with the daughter.  We will follow-up on Monday.  Feel free to call the surgeon on call over the weekend if her status changes.  Imogene Burn Bren Steers 02/27/2022, 10:22 AM

## 2022-02-27 NOTE — H&P (Signed)
History and Physical    Patient: Paula Peterson LFY:[id number] DOB: [birth year] DOA: 02/27/2022 DOS: the patient was seen and examined on 02/27/2022 PCP: Caryl Bis, MD  Patient coming from: Home  Chief Complaint:  Chief Complaint  Patient presents with   Abdominal Pain   Emesis   HPI: Paula Peterson is a 81 y.o. female with medical history significant of S4 ovarian cancer, PE/DVT on eliquis, recurrent SBO, HTN, hypothyroidism. Presenting with abdominal pain, N/V. Her symptoms started yesterday around 1800hrs. She has RLQ/LLQ sharp, stabbing abdominal pain. Her fentanyl patch and oxycodone were not helping. She has N/V w/o blood. Her daughter spoke with her hospice nurse and increased her ativan and zofran. This did not help. When her symptoms did not improve, she decided to bring the patient to the hospital for evaluation. She denies any other aggravating or alleviating factors.   Review of Systems: As mentioned in the history of present illness. All other systems reviewed and are negative. Past Medical History:  Diagnosis Date   Back pain    DVT (deep venous thrombosis) ([place]) 02/02/2019   had DVT first that moved into lung   GERD (gastroesophageal reflux disease)    History of pulmonary embolus (PE) 02/02/2019   confirmed by CT chest- had DVT first that moved into lungs per patient   Hypertension    Ovarian cancer ([place])    Pessary maintenance    Stroke [facility])    Past Surgical History:  Procedure Laterality Date   ABDOMINAL HYSTERECTOMY     ovaries left   BACK SURGERY     DEBULKING Bilateral 06/06/2019   Procedure: RADICAL TUMOR DEBULKING;  Surgeon: Everitt Amber, MD;  [facility];  Service: Gynecology;  Laterality: Bilateral;   FRACTURE SURGERY     IR IMAGING GUIDED PORT INSERTION  12/15/2018   OMENTECTOMY Bilateral 06/06/2019   Procedure: OMENTECTOMY;  Surgeon: Everitt Amber, MD;  [facility];  Service: Gynecology;  Laterality: Bilateral;   SALPINGOOPHORECTOMY Bilateral  06/06/2019   Procedure: EXPLORATORY LAPAROTOMY,OPEN RIGHT SALPINGO OOPHORECTOMY;  Surgeon: Everitt Amber, MD;  [facility];  Service: Gynecology;  Laterality: Bilateral;   Social History:  reports that she has never smoked. She has never been exposed to tobacco smoke. She has never used smokeless tobacco. She reports that she does not drink alcohol and does not use drugs.  No Known Allergies  Family History  Problem Relation Age of Onset   Stomach cancer Sister        dx 84s-50s   Dementia Mother    Cancer Sister        unk type, hysterectomy, dx 71s    Prior to Admission medications   Medication Sig Start Date End Date Taking? Authorizing Provider  acetaminophen (TYLENOL) 500 MG tablet Take 1,000 mg by mouth every 6 (six) hours as needed for mild pain or fever.   Yes [provider]  anti-nausea (EMETROL) solution Take 10 mLs by mouth every 15 (fifteen) minutes as needed for nausea or vomiting.   Yes [provider]  apixaban (ELIQUIS) 5 MG TABS tablet Take 1 tablet (5 mg total) by mouth 2 (two) times daily. 10/01/21  Yes Gorsuch, Ni, MD  atenolol (TENORMIN) 25 MG tablet TAKE ONE (1) TABLET BY MOUTH EVERY DAY Patient taking differently: Take 25 mg by mouth daily. 01/26/22  Yes Gorsuch, Ni, MD  bisacodyl (DULCOLAX) 5 MG EC tablet Take 1 tablet (5 mg total) by mouth daily as needed for moderate constipation. Patient  taking differently: Take 5-10 mg by mouth daily. 01/30/22  Yes Amin, Ankit Chirag, MD  dexamethasone (DECADRON) 4 MG tablet Take 1 tablet (4 mg total) by mouth daily. 01/31/22  Yes Amin, Ankit Chirag, MD  diclofenac Sodium (VOLTAREN) 1 % GEL Apply 1 application. topically daily as needed (arthritis pain).   Yes [provider]  fentaNYL (DURAGESIC) 25 MCG/HR Place 1 patch onto the skin every 3 (three) days. 02/02/22  Yes Amin, Jeanella Flattery, MD  levothyroxine (SYNTHROID) 75 MCG tablet Take 1 tablet (75 mcg total) by mouth daily before breakfast. 07/18/20  Yes  Emokpae, Courage, MD  lidocaine-prilocaine (EMLA) cream Apply 1 application topically daily as needed (port access). 07/22/21  Yes Gorsuch, Ni, MD  LORazepam (ATIVAN) 0.5 MG tablet Take 0.5-1 mg by mouth every 4 (four) hours as needed for anxiety (for nausea). 02/18/22  Yes [provider]  mometasone (NASONEX) 50 MCG/ACT nasal spray Place 2 sprays into the nose daily as needed (allergies).   Yes [provider]  ondansetron (ZOFRAN-ODT) 4 MG disintegrating tablet Take 1 tablet (4 mg total) by mouth every 8 (eight) hours as needed for nausea or vomiting. 01/30/22  Yes Amin, Ankit Chirag, MD  oxyCODONE (OXY IR/ROXICODONE) 5 MG immediate release tablet TAKE 1 TABLET BY MOUTH EVERY 4 HOURS AS NEEDED FOR SEVERE PAIN 01/20/22  Yes Gorsuch, Ni, MD  polyethylene glycol (MIRALAX / GLYCOLAX) 17 g packet Take 17 g by mouth daily as needed for mild constipation. 01/30/22  Yes Amin, Jeanella Flattery, MD  ciprofloxacin (CIPRO) 250 MG tablet Take 1 tablet (250 mg total) by mouth 2 (two) times daily. Patient not taking: Reported on 02/27/2022 02/02/22   [name], MD    Physical Exam: Vitals:   02/27/22 0630 02/27/22 0651 02/27/22 0730 02/27/22 0800  BP: 133/76  116/66 (!) 157/60  Pulse: 73  72 64  Resp: 11  (!) 9 11  Temp:  (!) 97.5 F (36.4 C)    TempSrc:  Axillary    SpO2: 96%  95% 96%  Weight:      Height:       General: 81 y.o. ill appearing female resting in bed Eyes: PERRL, normal sclera ENMT: Nares patent w/o discharge, orophaynx clear, dentition normal, ears w/o discharge/lesions/ulcers Neck: Supple, trachea midline Cardiovascular: RRR, +S1, S2, no m/g/r, equal pulses throughout Respiratory: CTABL, no w/r/r, normal WOB GI: BS hypoactive, ND, TTP LLQ, soft MSK: No e/c/c Neuro: A&O x 3, no focal deficits Psyc: Appropriate interaction and affect, calm/cooperative  Data Reviewed:  Lab Results  Component Value Date   NA 137 02/27/2022   K 3.5 02/27/2022   CO2 25 02/27/2022    GLUCOSE 147 (H) 02/27/2022   BUN 26 (H) 02/27/2022   CREATININE 1.04 (H) 02/27/2022   CALCIUM 9.9 02/27/2022   GFRNONAA 55 (L) 02/27/2022   Lab Results  Component Value Date   WBC 10.9 (H) 02/27/2022   HGB 13.0 02/27/2022   HCT 39.5 02/27/2022   MCV 104.5 (H) 02/27/2022   PLT 174 02/27/2022   CT ab/pelvis w/ 1. Similar appearance of partial small bowel obstruction with transition point in the right lower quadrant of the abdomen. The degree of small-bowel distention appears unchanged from previous exam. 2. Similar appearance of peritoneal carcinomatosis. 3. Moderate to large right pleural effusion with atelectasis of the right lower lobe and right middle lobe. Unchanged from previous exam. 4. Aortic Atherosclerosis (ICD10-I70.0).  Assessment and Plan: SBO     - admit to inpt, tele     -  she has recurrent SBOs and has been told in the past that she is not a surgical candidate     - she is refusing NGT placement at this time: "only want it as a last resort"     - general surgery has been consulted by EDP, but don't think there will be much for them to offer here; we do appreciate their assistance though     - IVF, sips/ice chips     - pain control, nausea control     - BM regimen  Intractable pain     - secondary to cancer     - dilaudid; consult palliative care for pain management  Stage 4 Ovarian Cancer     - chemo stopped by Dr. Alvy Bimler; patient is on outpt hospice  Goals of care     - discussion w/ patient's daughter after general surgery spoke with her; I asked about her outpt hospice decision making and how she would like to proceed while in-hospital given that she is not a curative surgical candidate and that she did not want an NGT placed     - her daughter noted that her mother's wishes are to be as comfortable as possible; she is looking for symptom control     - I described comfort care measures in detail to her and she says she thinks that would be appropriate  but there is some hesitance     - can go forward with current plan and have palliative care consult  Chronic pleural effusion     - stable, monitor  HTN     - continue home regimen when she can tolerate PO     - PRN meds available  Hypothyroidism     - resume home regimen when she can tolerate PO  Hx of PE/DVT     - resume home regimen when she can tolerate PO  Advance Care Planning:   Code Status: DNR  Consults: General Surgery  Family Communication: w/ daughter at bedside  Severity of Illness: The appropriate patient status for this patient is INPATIENT. Inpatient status is judged to be reasonable and necessary in order to provide the required intensity of service to ensure the patient's safety. The patient's presenting symptoms, physical exam findings, and initial radiographic and laboratory data in the context of their chronic comorbidities is felt to place them at high risk for further clinical deterioration. Furthermore, it is not anticipated that the patient will be medically stable for discharge from the hospital within 2 midnights of admission.   * I certify that at the point of admission it is my clinical judgment that the patient will require inpatient hospital care spanning beyond 2 midnights from the point of admission due to high intensity of service, high risk for further deterioration and high frequency of surveillance required.*  Time spent in coordination of this H&P: 50 minutes   Author: Jonnie Finner, DO 02/27/2022 8:58 AM  For on call review [email or web address].

## 2022-02-28 ENCOUNTER — Inpatient Hospital Stay (HOSPITAL_COMMUNITY): Payer: Medicare Other

## 2022-02-28 DIAGNOSIS — Z66 Do not resuscitate: Secondary | ICD-10-CM

## 2022-02-28 DIAGNOSIS — R52 Pain, unspecified: Secondary | ICD-10-CM

## 2022-02-28 DIAGNOSIS — I2699 Other pulmonary embolism without acute cor pulmonale: Secondary | ICD-10-CM | POA: Diagnosis not present

## 2022-02-28 DIAGNOSIS — I10 Essential (primary) hypertension: Secondary | ICD-10-CM

## 2022-02-28 DIAGNOSIS — E039 Hypothyroidism, unspecified: Secondary | ICD-10-CM | POA: Diagnosis not present

## 2022-02-28 DIAGNOSIS — Z7189 Other specified counseling: Secondary | ICD-10-CM

## 2022-02-28 DIAGNOSIS — J90 Pleural effusion, not elsewhere classified: Secondary | ICD-10-CM

## 2022-02-28 DIAGNOSIS — K56609 Unspecified intestinal obstruction, unspecified as to partial versus complete obstruction: Secondary | ICD-10-CM | POA: Diagnosis not present

## 2022-02-28 DIAGNOSIS — Z515 Encounter for palliative care: Secondary | ICD-10-CM

## 2022-02-28 LAB — COMPREHENSIVE METABOLIC PANEL
ALT: 17 U/L (ref 0–44)
AST: 16 U/L (ref 15–41)
Albumin: 2.8 g/dL — ABNORMAL LOW (ref 3.5–5.0)
Alkaline Phosphatase: 68 U/L (ref 38–126)
Anion gap: 7 (ref 5–15)
BUN: 25 mg/dL — ABNORMAL HIGH (ref 8–23)
CO2: 24 mmol/L (ref 22–32)
Calcium: 8.7 mg/dL — ABNORMAL LOW (ref 8.9–10.3)
Chloride: 105 mmol/L (ref 98–111)
Creatinine, Ser: 0.89 mg/dL (ref 0.44–1.00)
GFR, Estimated: >60 mL/min (ref >60)
Glucose, Bld: 115 mg/dL — ABNORMAL HIGH (ref 70–99)
Potassium: 4.2 mmol/L (ref 3.5–5.1)
Sodium: 136 mmol/L (ref 135–145)
Total Bilirubin: 0.7 mg/dL (ref 0.3–1.2)
Total Protein: 5.5 g/dL — ABNORMAL LOW (ref 6.5–8.1)

## 2022-02-28 LAB — CBC
HCT: 34.3 % — ABNORMAL LOW (ref 36.0–46.0)
Hemoglobin: 11 g/dL — ABNORMAL LOW (ref 12.0–15.0)
MCH: 34.8 pg — ABNORMAL HIGH (ref 26.0–34.0)
MCHC: 32.1 g/dL (ref 30.0–36.0)
MCV: 108.5 fL — ABNORMAL HIGH (ref 80.0–100.0)
Platelets: 121 10*3/uL — ABNORMAL LOW (ref 150–400)
RBC: 3.16 MIL/uL — ABNORMAL LOW (ref 3.87–5.11)
RDW: 15.4 % (ref 11.5–15.5)
WBC: 4 10*3/uL (ref 4.0–10.5)
nRBC: 0 % (ref 0.0–0.2)

## 2022-02-28 MED ORDER — SENNOSIDES-DOCUSATE SODIUM 8.6-50 MG PO TABS
2.0000 | ORAL_TABLET | Freq: Two times a day (BID) | ORAL | Status: DC
  Administered 2022-02-28 – 2022-03-02 (×5): 2 via ORAL
  Filled 2022-02-28 (×5): qty 2

## 2022-02-28 MED ORDER — OXYCODONE HCL 5 MG PO TABS
5.0000 mg | ORAL_TABLET | ORAL | Status: DC | PRN
  Administered 2022-02-28 – 2022-03-01 (×4): 5 mg via ORAL
  Filled 2022-02-28 (×5): qty 1

## 2022-02-28 MED ORDER — MORPHINE SULFATE (CONCENTRATE) 10 MG/0.5ML PO SOLN: 5.0000 mg | ORAL | Status: DC | PRN

## 2022-02-28 NOTE — Progress Notes (Signed)
   Palliative Medicine Inpatient Follow Up Note     Chart Reviewed. Patient assessed at the bedside.   Ms. Paula Peterson is resting comfortably in bed. States her pain is ongoing but well managed. Did receive a dose of IV medication today. Tolerating fentanyl patch. Dexamethasone restarted.   We reviewed previous goals of care discussions had with my colleague on yesterday. Patient is clear in expressing goal is to return home with hospice support. She is already connected and closely followed by [facility].   Ms. Paula Peterson is not interested in aggressive work-up or interventions. This includes daily lab draws/procedures. Would like to continue with IVF while hospitalized.   Discussed the importance of continued conversation with family and their  medical providers regarding overall plan of care and treatment options, ensuring decisions are within the context of the patients values and GOCs.   Questions addressed and support provided.    Objective Assessment: Vital Signs Vitals:   02/28/22 0915 02/28/22 1309  BP: 138/70 (!) 152/79  Pulse: 60 67  Resp: 15 18  Temp: 97.9 F (36.6 C) 98 F (36.7 C)  SpO2: 95% 92%    Intake/Output Summary (Last 24 hours) at 02/28/2022 1451 Last data filed at 02/28/2022 1010 Gross per 24 hour  Intake 2006.86 ml  Output --  Net 2006.86 ml   Last Weight  Most recent update: 02/27/2022  2:56 AM    Weight  95.3 kg (210 lb)            Gen:  NAD HEENT: moist mucous membranes CV: Regular rate and rhythm, no murmurs rubs or gallops PULM: Normal breathing pattern  ABD: soft/nontender/nondistended/normal bowel sounds EXT: No edema Neuro: Alert and oriented x3, mood appropriate   SUMMARY OF RECOMMENDATIONS   Continue with current plan of care per medical team. All care comfort focused. No escalation in care.  Ms. Paula Peterson clear in expressed wishes to continue IVF while hospitalized, minimize interventions and medications (lab draws/imaging), hopeful to  return home in the next 24-48hrs with outpatient hospice support via [place].   PMT will continue to support and follow. Please secure chat for urgent needs.    Symptom Management  Neoplasm related pain/abdominal pain Fentanyl 25 mcg patch every 72 hours hydromorphone 1 mg every 4 hours as needed for breakthrough pain Oxy IR 5 mg every 4 hours as needed for pain Dexamethasone 4 mg daily Nausea  Compazine 10 mg every 6 hours as needed for nausea Dexamethasone 4 mg daily  Time Total: 35 min   Visit consisted of counseling and education dealing with the complex and emotionally intense issues of symptom management and palliative care in the setting of serious and potentially life-threatening illness.Greater than 50%  of this time was spent counseling and coordinating care related to the above assessment and plan.  Paula Peterson, AGPCNP-BC  Palliative Medicine Team [phone]  Palliative Medicine Team providers are available by phone from 7am to 7pm daily and can be reached through the team cell phone. Should this patient require assistance outside of these hours, please call the patient's attending physician.

## 2022-02-28 NOTE — Progress Notes (Signed)
[facility] PROGRESS NOTE   Paula Peterson VZC:[id number] DOB: Sep 23, 1940 DOA: 02/27/2022  PCP: Caryl Bis, MD  Brief History/Interval Summary: 81 y.o. female with medical history significant for ovarian cancer, PE/DVT on eliquis, recurrent SBO, HTN, hypothyroidism. Presenting with abdominal pain, N/V.  CT scan raised concern for partial small bowel obstruction.  Patient was hospitalized for further management.    Consultants: General surgery  Procedures: None yet    Subjective/Interval History: Patient complains of pain in her upper abdomen.  Denies any nausea or vomiting.  Not passing any gas from below.  Her daughter is at the bedside.    Assessment/Plan:  Small bowel obstruction, partial Patient seen by general surgery.  Patient refused NG tube placement.  She is experiencing some abdominal pain but denies any nausea or vomiting.  She does have bowel sounds but has not passed any gas yet. Looks like the focuses mainly on comfort with the plans for patient to return home with resumption of hospice services when her symptoms have improved. We will place her on some laxatives and stool softeners.  Hopefully she will be able to pass some gas today.  She will be mobilized.  Stage IV ovarian cancer with intractable pain On fentanyl patch.  Currently also getting Dilaudid as needed.  We will also add oxycodone orally and oral morphine as needed for breakthrough pain. Patient not on treatment for her cancer at this time.  Chronic pleural effusion Stable.  No respiratory symptoms at this time.  Essential hypertension Noted to be on atenolol.  History of PE and DVT Noted to be on apixaban.  Hypothyroidism Continue levothyroxine.  Goals of care Patient already under hospice care at home.  Plan is to return home with hospice services once symptoms are better controlled.  Obesity Estimated body mass index is 32.89 kg/m as calculated from the following:   Height as  of this encounter: '5\' 7"'$  (1.702 m).   Weight as of this encounter: 95.3 kg.  DVT Prophylaxis: On apixaban Code Status: DNR Family Communication: Discussed with patient and her daughter Disposition Plan: Hopefully return home when improved  Status is: Inpatient Remains inpatient appropriate because: Partial small bowel obstruction    Medications: Scheduled:  apixaban  5 mg Oral BID   atenolol  25 mg Oral Daily   bisacodyl  5 mg Oral Daily   Chlorhexidine Gluconate Cloth  6 each Topical Daily   dexamethasone  4 mg Oral Daily   fentaNYL  1 patch Transdermal Q72H   levothyroxine  75 mcg Oral Q0600   senna-docusate  2 tablet Oral BID   Continuous:  sodium chloride 125 mL/hr at 02/27/22 1832   JOI:NOMVEHMCNO oral rinse, HYDROmorphone (DILAUDID) injection, LORazepam, morphine CONCENTRATE, oxyCODONE, polyvinyl alcohol, prochlorperazine, sodium chloride flush  Antibiotics: Anti-infectives (From admission, onward)    None       Objective:  Vital Signs  Vitals:   02/27/22 1718 02/27/22 2122 02/28/22 0115 02/28/22 0915  BP: 133/68 134/64 (!) 126/59 138/70  Pulse: 75 71 (!) 59 60  Resp: '18 16 16 15  '$ Temp: 97.7 F (36.5 C) 98 F (36.7 C) 97.9 F (36.6 C) 97.9 F (36.6 C)  TempSrc: Oral Oral Oral Oral  SpO2: 100% 94% 92% 95%  Weight:      Height:        Intake/Output Summary (Last 24 hours) at 02/28/2022 1041 Last data filed at 02/28/2022 0600 Gross per 24 hour  Intake 1986.86 ml  Output --  Net  1986.86 ml   Filed Weights   02/27/22 0256  Weight: 95.3 kg    General appearance: Awake alert.  In no distress Resp: Clear to auscultation bilaterally.  Normal effort Cardio: S1-S2 is normal regular.  No S3-S4.  No rubs murmurs or bruit GI: Abdomen is soft.  Tender in the mid abdomen without any rebound rigidity or guarding.  Bowel sounds present but sluggish. Extremities: No edema.  Full range of motion of lower extremities. Neurologic: Alert and oriented x3.  No focal  neurological deficits.    Lab Results:  Data Reviewed: I have personally reviewed following labs and reports of the imaging studies  CBC: Recent Labs  Lab 02/27/22 0415 02/28/22 0308  WBC 10.9* 4.0  NEUTROABS 8.1*  --   HGB 13.0 11.0*  HCT 39.5 34.3*  MCV 104.5* 108.5*  PLT 174 121*    Basic Metabolic Panel: Recent Labs  Lab 02/27/22 0415 02/28/22 0308  NA 137 136  K 3.5 4.2  CL 99 105  CO2 25 24  GLUCOSE 147* 115*  BUN 26* 25*  CREATININE 1.04* 0.89  CALCIUM 9.9 8.7*    GFR: Estimated Creatinine Clearance: 61.8 mL/min (by C-G formula based on SCr of 0.89 mg/dL).  Liver Function Tests: Recent Labs  Lab 02/27/22 0415 02/28/22 0308  AST 18 16  ALT 18 17  ALKPHOS 72 68  BILITOT 0.8 0.7  PROT 6.5 5.5*  ALBUMIN 3.4* 2.8*    Recent Labs  Lab 02/27/22 0415  LIPASE 24     Radiology Studies: CT ABDOMEN PELVIS W CONTRAST  Result Date: 02/27/2022 CLINICAL DATA:  Acute abdominal pain. History of ovarian cancer. Status post bilateral omentectomy and bilateral salpingo oophorectomy. EXAM: CT ABDOMEN AND PELVIS WITH CONTRAST TECHNIQUE: Multidetector CT imaging of the abdomen and pelvis was performed using the standard protocol following bolus administration of intravenous contrast. RADIATION DOSE REDUCTION: This exam was performed according to the departmental dose-optimization program which includes automated exposure control, adjustment of the mA and/or kV according to patient size and/or use of iterative reconstruction technique. CONTRAST:  144m OMNIPAQUE IOHEXOL 300 MG/ML  SOLN COMPARISON:  01/28/2022 FINDINGS: Lower chest: Moderate to large right pleural effusion with atelectasis of the right lower lobe and right middle lobe. This is similar to the previous exam. Hepatobiliary: Small area of low attenuation within the inferior right hepatic lobe is too small to characterize measuring 7 mm. Peritoneal metastasis with studding of the liver surface is again noted and  appears similar to the previous exam. Progressive peritoneal implants along the surface of the gallbladder are also again seen. Index nodule measures 9 mm, image 44/2. Previously 8 mm. No signs of gallbladder wall inflammation. No bile duct dilatation. Pancreas: Unremarkable. No pancreatic ductal dilatation or surrounding inflammatory changes. Spleen: Normal in size without focal abnormality. Adrenals/Urinary Tract: Normal adrenal glands. No kidney mass or hydronephrosis. Urinary bladder is unremarkable. Stomach/Bowel: Stomach appears normal. Again noted is mild fluid dilatation scattered small bowel loops which measure up to 2.7 cm. Transition point is in the right lower quadrant of the abdomen, similar to the previous exam, image 73/2. Imaging findings remain concerning for partial small bowel obstruction. No pathologic dilatation of the colon. Vascular/Lymphatic: Aortic atherosclerosis without aneurysm. No abdominopelvic adenopathy. Reproductive: Status post hysterectomy. Pessary device identified within the vaginal apex. Other: There is a small volume of ascites within the dependent portion of the pelvis. Peritoneal nodularity is again noted compatible with peritoneal carcinomatosis. -Soft tissue implant within the ventral right lower  quadrant of the abdomen measures 1.8 x 4.0 cm, unchanged from the previous exam, image 54/2. -Left abdominal peritoneal implant measures 0.9 cm, image 49/2. Previously this measured the same. -Adjacent nodule measures 1.5 x 1.0 cm, image 51/2. Previously 1.0 x 0.7 cm. -Nodule in the left upper quadrant of the abdomen measures 0.7 cm, image 30/2. Unchanged from previous exam. Musculoskeletal: No acute or significant osseous findings. IMPRESSION: 1. Similar appearance of partial small bowel obstruction with transition point in the right lower quadrant of the abdomen. The degree of small-bowel distention appears unchanged from previous exam. 2. Similar appearance of peritoneal  carcinomatosis. 3. Moderate to large right pleural effusion with atelectasis of the right lower lobe and right middle lobe. Unchanged from previous exam. 4. Aortic Atherosclerosis (ICD10-I70.0). Electronically Signed   By: Kerby Moors M.D.   On: 02/27/2022 06:11       LOS: 1 day   [facility] Pager on [email or web address]  02/28/2022, 10:41 AM

## 2022-02-28 NOTE — Progress Notes (Signed)
I attest to student documentation.   Mckynleigh Mussell, BSN-RN, CFPN Nursing Adjunct Faculty [employer] 

## 2022-02-28 NOTE — Progress Notes (Signed)
Mobility Specialist Cancellation/Refusal Note:   Reason for Cancellation/Refusal: Pt declined mobility at this time. Xray team in room currently. Will check back as schedule permits.       [facility]

## 2022-02-28 NOTE — Progress Notes (Signed)
Mobility Specialist Cancellation/Refusal Note:    02/28/22 1334  Mobility  Activity Refused mobility     Reason for Cancellation/Refusal: Pt declined mobility at this time. Pt in a lot of stomach pain. Asks that I come back around 4.  Will check back as schedule permits.      [facility]

## 2022-03-01 ENCOUNTER — Inpatient Hospital Stay (HOSPITAL_COMMUNITY): Payer: Medicare Other

## 2022-03-01 DIAGNOSIS — K56609 Unspecified intestinal obstruction, unspecified as to partial versus complete obstruction: Secondary | ICD-10-CM | POA: Diagnosis not present

## 2022-03-01 DIAGNOSIS — E039 Hypothyroidism, unspecified: Secondary | ICD-10-CM | POA: Diagnosis not present

## 2022-03-01 DIAGNOSIS — J90 Pleural effusion, not elsewhere classified: Secondary | ICD-10-CM

## 2022-03-01 DIAGNOSIS — I10 Essential (primary) hypertension: Secondary | ICD-10-CM | POA: Diagnosis not present

## 2022-03-01 DIAGNOSIS — R52 Pain, unspecified: Secondary | ICD-10-CM | POA: Diagnosis not present

## 2022-03-01 DIAGNOSIS — C800 Disseminated malignant neoplasm, unspecified: Secondary | ICD-10-CM | POA: Diagnosis not present

## 2022-03-01 DIAGNOSIS — I2699 Other pulmonary embolism without acute cor pulmonale: Secondary | ICD-10-CM | POA: Diagnosis not present

## 2022-03-01 MED ORDER — ACETAMINOPHEN 325 MG PO TABS
650.0000 mg | ORAL_TABLET | Freq: Four times a day (QID) | ORAL | Status: DC | PRN
  Administered 2022-03-01 – 2022-03-02 (×2): 650 mg via ORAL
  Filled 2022-03-01 (×2): qty 2

## 2022-03-01 MED ORDER — BISACODYL 5 MG PO TBEC
10.0000 mg | DELAYED_RELEASE_TABLET | Freq: Every day | ORAL | Status: DC
  Administered 2022-03-01 – 2022-03-02 (×2): 10 mg via ORAL
  Filled 2022-03-01 (×2): qty 2

## 2022-03-01 MED ORDER — LIDOCAINE HCL 1 % IJ SOLN
INTRAMUSCULAR | Status: AC
  Administered 2022-03-01: 10 mL
  Filled 2022-03-01: qty 20

## 2022-03-01 MED ORDER — APIXABAN 5 MG PO TABS
5.0000 mg | ORAL_TABLET | Freq: Two times a day (BID) | ORAL | Status: DC
  Administered 2022-03-02: 5 mg via ORAL
  Filled 2022-03-01: qty 1

## 2022-03-01 MED ORDER — ALBUTEROL SULFATE (2.5 MG/3ML) 0.083% IN NEBU
2.5000 mg | INHALATION_SOLUTION | RESPIRATORY_TRACT | Status: DC | PRN
Start: 2022-03-01 — End: 2022-03-02
  Administered 2022-03-01: 2.5 mg via RESPIRATORY_TRACT
  Filled 2022-03-01: qty 3

## 2022-03-01 NOTE — Progress Notes (Signed)
Palliative Medicine Inpatient Follow Up Note     Chart Reviewed. Patient assessed at the bedside.   Paula Peterson is resting comfortably in bed. Her daughter is at the bedside. Patient complains of shortness of breath this morning. Does feel some relief after receiving nebulizer treatment.  States her pain is well controlled on current regimen.  Advise we would not make any adjustments.  Patient and daughter shares concerns about her lack of bowel movements.  She is however passing gas.  Daughter states patient has had chronic issues with constipation.  She has tried multiple medications at home such as MiraLAX, senna, Dulcolax.  She is currently taking senna twice daily and has been started on Dulcolax this morning.  Education provided on possible use of lactulose or possibly Linzess/Movantik due to chronic constipation opioid use.  Patient and daughter verbalized understanding and is open to any options that would give her some regularity.  Also has not tried suppositories.  Not as much interested in enemas.   Expressed goal is to  continue to treat the treatable while hospitalized with a focus on her comfort and discharged home with family and hospice support.  Paula Peterson is not interested in aggressive work-up or interventions. This includes daily lab draws/procedures. Would like to continue with IVF while hospitalized.   Discussed the importance of continued conversation with family and their  medical providers regarding overall plan of care and treatment options, ensuring decisions are within the context of the patients values and GOCs.   Questions addressed and support provided.    Objective Assessment: Vital Signs Vitals:   02/28/22 2021 03/01/22 1015  BP: (!) 145/70   Pulse: 62   Resp: 18   Temp: 98.3 F (36.8 C)   SpO2: 92% 92%    Intake/Output Summary (Last 24 hours) at 03/01/2022 1050 Last data filed at 02/28/2022 1543 Gross per 24 hour  Intake 906.9 ml  Output --  Net  906.9 ml    Last Weight  Most recent update: 02/27/2022  2:56 AM    Weight  95.3 kg (210 lb)            Gen:  NAD HEENT: moist mucous membranes PULM: Normal breathing pattern  ABD: soft/nontender/nondistended/normal bowel sounds EXT: No edema Neuro: Alert and oriented x3, mood appropriate   SUMMARY OF RECOMMENDATIONS   Continue with current plan of care per medical team. All care comfort focused. No escalation in care.  Paula Peterson clear in expressed wishes to continue IVF while hospitalized, minimize interventions and medications (lab draws/imaging), hopeful to return home in the next 24-48hrs with outpatient hospice support via [place].   PMT will continue to support and follow. Please secure chat for urgent needs.    Symptom Management  Neoplasm related pain/abdominal pain Fentanyl 25 mcg patch every 72 hours hydromorphone 1 mg every 4 hours as needed for breakthrough pain Oxy IR 5 mg every 4 hours as needed for pain Dexamethasone 4 mg daily Nausea   Compazine 10 mg every 6 hours as needed for nausea Dexamethasone 4 mg daily Constipation Senna twice daily Started on Dulcolax daily today per attending If no contraindications will consider lactulose or Linzess/Movantik due to chronic constipation and opioid use.  Time Total: 35 min   Visit consisted of counseling and education dealing with the complex and emotionally intense issues of symptom management and palliative care in the setting of serious and potentially life-threatening illness.Greater than 50%  of this time was spent  counseling and coordinating care related to the above assessment and plan.  [name], AGPCNP-BC  Palliative Medicine Team/[place]    Palliative Medicine Team providers are available by phone from 7am to 7pm daily and can be reached through the team cell phone. Should this patient require assistance outside of these hours, please call the patient's  attending physician.

## 2022-03-01 NOTE — Progress Notes (Signed)
[facility] PROGRESS NOTE   Paula Peterson JTT:[id number] DOB: 1940/11/10 DOA: 02/27/2022  PCP: Caryl Bis, MD  Brief History/Interval Summary: 81 y.o. female with medical history significant for ovarian cancer, PE/DVT on eliquis, recurrent SBO, HTN, hypothyroidism. Presenting with abdominal pain, N/V.  CT scan raised concern for partial small bowel obstruction.  Patient was hospitalized for further management.    Consultants: General surgery  Procedures: None yet    Subjective/Interval History: Continues to have abdominal pain.  However she does mention that she has been passing some gas from below.  No bowel movements.  Daughter is at the bedside.  Also complains of difficulty breathing.      Assessment/Plan:  Small bowel obstruction, partial Patient seen by general surgery.  Patient refused NG tube placement.   She is starting to pass some gas but continues to have abdominal discomfort.  Focuses mainly on comfort with plans for patient to return home with resumption of hospice services when her symptoms have improved. Continue with laxatives and stool softeners.  Continue to mobilize.    Stage IV ovarian cancer with intractable pain On fentanyl patch.  Also on as needed oxycodone and Trevena's Dilaudid.  Also on oral/sublingual morphine for breakthrough pain.   Patient not on treatment for her cancer at this time.  Right-sided pleural effusion This is most likely malignant.  Complains of more shortness of breath this morning.  She does have a little bit of wheezing.  We will give her nebulizer treatment.   May need to do another thoracentesis for symptom relief.  We will hold her Eliquis.  Ultrasound thoracentesis ordered.    Essential hypertension Noted to be on atenolol.  History of PE and DVT Hold apixaban for thoracentesis.  Hypothyroidism Continue levothyroxine.  Goals of care Patient already under hospice care at home.  Plan is to return home with  hospice services once symptoms are better controlled.  Obesity Estimated body mass index is 32.89 kg/m as calculated from the following:   Height as of this encounter: '5\' 7"'$  (1.702 m).   Weight as of this encounter: 95.3 kg.  DVT Prophylaxis: On apixaban Code Status: DNR Family Communication: Discussed with patient and her daughter Disposition Plan: Hopefully return home when improved  Status is: Inpatient Remains inpatient appropriate because: Partial small bowel obstruction    Medications: Scheduled:  [START ON 03/02/2022] apixaban  5 mg Oral BID   atenolol  25 mg Oral Daily   bisacodyl  10 mg Oral Daily   Chlorhexidine Gluconate Cloth  6 each Topical Daily   dexamethasone  4 mg Oral Daily   fentaNYL  1 patch Transdermal Q72H   levothyroxine  75 mcg Oral Q0600   senna-docusate  2 tablet Oral BID   Continuous:  sodium chloride 75 mL/hr at 03/01/22 0042   ESP:[id number], antiseptic oral rinse, HYDROmorphone (DILAUDID) injection, LORazepam, morphine CONCENTRATE, oxyCODONE, polyvinyl alcohol, prochlorperazine, sodium chloride flush  Antibiotics: Anti-infectives (From admission, onward)    None       Objective:  Vital Signs  Vitals:   02/28/22 0115 02/28/22 0915 02/28/22 1309 02/28/22 2021  BP: (!) 126/59 138/70 (!) 152/79 (!) 145/70  Pulse: (!) 59 60 67 62  Resp: '16 15 18 18  '$ Temp: 97.9 F (36.6 C) 97.9 F (36.6 C) 98 F (36.7 C) 98.3 F (36.8 C)  TempSrc: Oral Oral Oral Oral  SpO2: 92% 95% 92% 92%  Weight:      Height:  Intake/Output Summary (Last 24 hours) at 03/01/2022 0931 Last data filed at 02/28/2022 1543 Gross per 24 hour  Intake 926.9 ml  Output --  Net 926.9 ml    Filed Weights   02/27/22 0256  Weight: 95.3 kg    General appearance: Awake alert.  In no distress Resp: Mildly tachypneic.  Scattered wheezing bilaterally.  Dullness to percussion on the right. Cardio: S1-S2 is normal regular.  No S3-S4.  No rubs murmurs or bruit GI:  Abdomen is soft.  Slightly distended.  Tender diffusely without any rebound rigidity or guarding.  No masses organomegaly.  Bowel sounds appreciated. Extremities: No edema.  Full range of motion of lower extremities. Neurologic: Alert and oriented x3.  No focal neurological deficits.     Lab Results:  Data Reviewed: I have personally reviewed following labs and reports of the imaging studies  CBC: Recent Labs  Lab 02/27/22 0415 02/28/22 0308  WBC 10.9* 4.0  NEUTROABS 8.1*  --   HGB 13.0 11.0*  HCT 39.5 34.3*  MCV 104.5* 108.5*  PLT 174 121*     Basic Metabolic Panel: Recent Labs  Lab 02/27/22 0415 02/28/22 0308  NA 137 136  K 3.5 4.2  CL 99 105  CO2 25 24  GLUCOSE 147* 115*  BUN 26* 25*  CREATININE 1.04* 0.89  CALCIUM 9.9 8.7*     GFR: Estimated Creatinine Clearance: 61.8 mL/min (by C-G formula based on SCr of 0.89 mg/dL).  Liver Function Tests: Recent Labs  Lab 02/27/22 0415 02/28/22 0308  AST 18 16  ALT 18 17  ALKPHOS 72 68  BILITOT 0.8 0.7  PROT 6.5 5.5*  ALBUMIN 3.4* 2.8*     Recent Labs  Lab 02/27/22 0415  LIPASE 24      Radiology Studies: CT ABDOMEN PELVIS W CONTRAST  Result Date: 02/27/2022 CLINICAL DATA:  Acute abdominal pain. History of ovarian cancer. Status post bilateral omentectomy and bilateral salpingo oophorectomy. EXAM: CT ABDOMEN AND PELVIS WITH CONTRAST TECHNIQUE: Multidetector CT imaging of the abdomen and pelvis was performed using the standard protocol following bolus administration of intravenous contrast. RADIATION DOSE REDUCTION: This exam was performed according to the departmental dose-optimization program which includes automated exposure control, adjustment of the mA and/or kV according to patient size and/or use of iterative reconstruction technique. CONTRAST:  143m OMNIPAQUE IOHEXOL 300 MG/ML  SOLN COMPARISON:  01/28/2022 FINDINGS: Lower chest: Moderate to large right pleural effusion with atelectasis of the right  lower lobe and right middle lobe. This is similar to the previous exam. Hepatobiliary: Small area of low attenuation within the inferior right hepatic lobe is too small to characterize measuring 7 mm. Peritoneal metastasis with studding of the liver surface is again noted and appears similar to the previous exam. Progressive peritoneal implants along the surface of the gallbladder are also again seen. Index nodule measures 9 mm, image 44/2. Previously 8 mm. No signs of gallbladder wall inflammation. No bile duct dilatation. Pancreas: Unremarkable. No pancreatic ductal dilatation or surrounding inflammatory changes. Spleen: Normal in size without focal abnormality. Adrenals/Urinary Tract: Normal adrenal glands. No kidney mass or hydronephrosis. Urinary bladder is unremarkable. Stomach/Bowel: Stomach appears normal. Again noted is mild fluid dilatation scattered small bowel loops which measure up to 2.7 cm. Transition point is in the right lower quadrant of the abdomen, similar to the previous exam, image 73/2. Imaging findings remain concerning for partial small bowel obstruction. No pathologic dilatation of the colon. Vascular/Lymphatic: Aortic atherosclerosis without aneurysm. No abdominopelvic adenopathy. Reproductive: Status  post hysterectomy. Pessary device identified within the vaginal apex. Other: There is a small volume of ascites within the dependent portion of the pelvis. Peritoneal nodularity is again noted compatible with peritoneal carcinomatosis. -Soft tissue implant within the ventral right lower quadrant of the abdomen measures 1.8 x 4.0 cm, unchanged from the previous exam, image 54/2. -Left abdominal peritoneal implant measures 0.9 cm, image 49/2. Previously this measured the same. -Adjacent nodule measures 1.5 x 1.0 cm, image 51/2. Previously 1.0 x 0.7 cm. -Nodule in the left upper quadrant of the abdomen measures 0.7 cm, image 30/2. Unchanged from previous exam. Musculoskeletal: No acute or  significant osseous findings. IMPRESSION: 1. Similar appearance of partial small bowel obstruction with transition point in the right lower quadrant of the abdomen. The degree of small-bowel distention appears unchanged from previous exam. 2. Similar appearance of peritoneal carcinomatosis. 3. Moderate to large right pleural effusion with atelectasis of the right lower lobe and right middle lobe. Unchanged from previous exam. 4. Aortic Atherosclerosis (ICD10-I70.0). Electronically Signed   By: Kerby Moors M.D.   On: 02/27/2022 06:11       LOS: 2 days   [facility] Pager on [email or web address]  03/01/2022, 9:31 AM

## 2022-03-01 NOTE — Procedures (Signed)
PROCEDURE SUMMARY:  Successful image-guided right thoracentesis. Yielded 1.4 liters of clear yellow fluid. Procedure aborted at this amount due to patient complaints of worsening chest pain. Small residual pleural effusion remains which is likely not amenable to repeat therapeutic thoracentesis at this time. Patient tolerated procedure well. EBL < 1 mL No immediate complications.  Specimen was not sent for labs. Post procedure CXR shows no pneumothorax.  Please see imaging section of Epic for full dictation.  Joaquim Nam PA-C 03/01/2022 1:41 PM

## 2022-03-01 NOTE — Progress Notes (Addendum)
Pt c/o SOB / wheezing on exertion new wheezing on exhale on RUL auscultation.

## 2022-03-01 NOTE — Plan of Care (Signed)
  Problem: Nutrition: Goal: Adequate nutrition will be maintained Outcome: Progressing   Problem: Pain Managment: Goal: General experience of comfort will improve Outcome: Progressing   Problem: Safety: Goal: Ability to remain free from injury will improve Outcome: Progressing   

## 2022-03-02 DIAGNOSIS — K56609 Unspecified intestinal obstruction, unspecified as to partial versus complete obstruction: Secondary | ICD-10-CM

## 2022-03-02 MED ORDER — HEPARIN SOD (PORK) LOCK FLUSH 100 UNIT/ML IV SOLN
500.0000 [IU] | INTRAVENOUS | Status: AC | PRN
  Administered 2022-03-02: 500 [IU]
  Filled 2022-03-02: qty 5

## 2022-03-02 MED ORDER — SENNOSIDES-DOCUSATE SODIUM 8.6-50 MG PO TABS: 2.0000 | ORAL_TABLET | Freq: Two times a day (BID) | ORAL | 1 refills | Status: AC

## 2022-03-02 NOTE — Progress Notes (Signed)
Mobility Specialist Cancellation/Refusal Note:   Reason for Cancellation/Refusal: Pt declined mobility at this time.Pt not in the mood to ambulate. Will check back as schedule permits.    [facility]

## 2022-03-02 NOTE — Care Management Important Message (Signed)
Important Message  Patient Details Palliative Name: Paula Peterson MRN: [id number] Date of Birth: Feb 14, 1941   Medicare Important Message Given:  No     Kerin Salen 03/02/2022, 9:34 AM

## 2022-03-02 NOTE — Discharge Summary (Signed)
[facility]  Physician Discharge Summary   Patient ID: Paula Peterson MRN: [id number] DOB/AGE: 1940/12/11 81 y.o.  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 female with medical history significant for ovarian cancer, PE/DVT on eliquis, recurrent SBO, HTN, hypothyroidism. Presenting with abdominal pain, N/V.  CT scan raised concern for partial small bowel obstruction.  Patient was hospitalized for further management.     Consultants: General surgery   Procedures: None     HOSPITAL COURSE:   Small bowel obstruction, partial Patient seen by general surgery.  Patient refused NG tube placement.   Focus was mainly on comfort with plans for patient to return home with resumption of hospice services once symptoms improved.  She was treated with laxatives and stool softeners.  She has had bowel movements.  She was started on clear liquids which she has tolerated well.  Wants to go home.  Okay for discharge today.   Stage IV ovarian cancer with intractable pain On fentanyl patch.  Also on as needed oxycodone and Trevena's Dilaudid.  Also on oral/sublingual morphine for breakthrough pain.   Patient not on treatment for her cancer at this time.   Right-sided pleural effusion This is most likely malignant.  She was complaining of dyspnea.  Thoracentesis was ordered.  Feels much better after fluid removal.  Pleurx catheter was offered to the patient but she declines.    Essential hypertension   History of PE and DVT Continue apixaban   Hypothyroidism Continue levothyroxine.   Goals of care Patient already under hospice care at home.  Plan is to return home with hospice services once symptoms are better controlled.   Obesity Estimated body mass index is 32.89 kg/m as calculated from the following:   Height as of this encounter: '5\' 7"'$  (1.702 m).   Weight as of this encounter: 95.3 kg.  Patient is stable.  Okay for discharge home today.   PERTINENT LABS:  The results of significant diagnostics from this hospitalization (including imaging, microbiology, ancillary and laboratory) are listed below for reference.     Labs:   Basic Metabolic Panel: Recent Labs  Lab 02/27/22 0415 02/28/22 0308  NA 137 136  K 3.5 4.2  CL 99 105  CO2 25 24  GLUCOSE 147* 115*  BUN 26* 25*  CREATININE 1.04* 0.89  CALCIUM 9.9 8.7*   Liver Function Tests: Recent Labs  Lab 02/27/22 0415 02/28/22 0308  AST 18 16  ALT 18 17  ALKPHOS 72 68  BILITOT 0.8 0.7  PROT 6.5 5.5*  ALBUMIN 3.4* 2.8*   Recent Labs  Lab 02/27/22 0415  LIPASE 24    CBC: Recent Labs  Lab 02/27/22 0415 02/28/22 0308  WBC 10.9* 4.0  NEUTROABS 8.1*  --   HGB 13.0 11.0*  HCT 39.5 34.3*  MCV 104.5* 108.5*  PLT 174 121*     IMAGING STUDIES US THORACENTESIS ASP PLEURAL SPACE W/IMG GUIDE  Result Date: 03/01/2022 INDICATION: Patient with history of stage IV ovarian  cancer, recurrent right pleural effusion. Request for therapeutic right thoracentesis. EXAM: ULTRASOUND GUIDED RIGHT THORACENTESIS MEDICATIONS: 7 mL 1% lidocaine COMPLICATIONS: None immediate. PROCEDURE: An ultrasound guided thoracentesis was thoroughly discussed with the patient and questions answered. The benefits, risks, alternatives and complications were also discussed. The patient understands and wishes to proceed with the procedure. Written consent was obtained. Ultrasound was performed to localize and mark an  adequate pocket of fluid in the right chest. The area was then prepped and draped in the normal sterile fashion. 1% Lidocaine was used for local anesthesia. Under ultrasound guidance a 6 Fr Safe-T-Centesis catheter was introduced. Thoracentesis was performed. The catheter was removed and a dressing applied. FINDINGS: A total of approximately 1.4 L of clear yellow fluid was removed. IMPRESSION: Successful ultrasound guided right thoracentesis yielding 1.4 L of pleural fluid. Read by Candiss Norse, PA-C Electronically Signed   By: Sandi Mariscal M.D.   On: 03/01/2022 15:14   DG Chest Port 1 View  Result Date: 03/01/2022 CLINICAL DATA:  Status post right thoracentesis. EXAM: PORTABLE CHEST 1 VIEW COMPARISON:  One-view chest x-ray 03/01/2022 at 6:20 a.m. FINDINGS: The heart is upper limits of normal for size. Right IJ Port-A-Cath is stable in position. Significant decrease noted in the right pleural effusion. No pneumothorax is present. Associated right lower lobe atelectasis is present. IMPRESSION: 1. Significant decrease in right pleural effusion without pneumothorax. 2. Otherwise stable chest x-ray. Electronically Signed   By: San Morelle M.D.   On: 03/01/2022 14:09   DG CHEST PORT 1 VIEW  Result Date: 03/01/2022 CLINICAL DATA:  81 year old female with shortness of breath. Ovarian cancer with right pleural effusion. Status post thoracentesis on 01/29/2022. EXAM: PORTABLE CHEST 1 VIEW COMPARISON:  CTA chest 01/28/2022. Post thoracentesis portable chest 01/29/2022. FINDINGS: Portable AP semi upright view at 0620 hours. Reaccumulation of moderate to large right pleural effusion since last month. Underlying lung volumes and mediastinal contours appear stable. Stable right chest power port. No pneumothorax. Stable left lung. No acute osseous abnormality identified. IMPRESSION: Reaccumulation of moderate to large right pleural effusion since August 3rd. Electronically Signed   By: Genevie Ann M.D.   On:  03/01/2022 07:09   DG Abd Portable 2V  Result Date: 02/28/2022 CLINICAL DATA:  Small bowel obstruction. EXAM: PORTABLE ABDOMEN - 2 VIEW COMPARISON:  None Available. FINDINGS: Upright film shows no evidence for intraperitoneal free air. Gas distended small bowel loops in the central abdomen measure up to 3.5 cm diameter. Gas is visible in the right colon and rectum. IMPRESSION: Mild gaseous distention of central small bowel loops. Imaging features compatible with ileus or persistent small bowel obstruction. Electronically Signed   By: Misty Stanley M.D.   On: 02/28/2022 10:52   CT ABDOMEN PELVIS W CONTRAST  Result Date: 02/27/2022 CLINICAL DATA:  Acute abdominal pain. History of ovarian cancer. Status post bilateral omentectomy and bilateral salpingo oophorectomy. EXAM: CT ABDOMEN AND PELVIS WITH CONTRAST TECHNIQUE: Multidetector CT imaging of the abdomen and pelvis was performed using the standard protocol following bolus administration of intravenous contrast. RADIATION DOSE REDUCTION: This exam was performed according to the departmental dose-optimization program which includes automated exposure control, adjustment of the mA and/or kV according to patient size and/or use of iterative reconstruction technique.  CONTRAST:  141m OMNIPAQUE IOHEXOL 300 MG/ML  SOLN COMPARISON:  01/28/2022 FINDINGS: Lower chest: Moderate to large right pleural effusion with atelectasis of the right lower lobe and right middle lobe. This is similar to the previous exam. Hepatobiliary: Small  area of low attenuation within the inferior right hepatic lobe is too small to characterize measuring 7 mm. Peritoneal metastasis with studding of the liver surface is again noted and appears similar to the previous exam. Progressive peritoneal implants along the surface of the gallbladder are also again seen. Index nodule measures 9 mm, image 44/2. Previously 8 mm. No signs of gallbladder wall inflammation. No bile duct dilatation. Pancreas:  Unremarkable. No pancreatic ductal dilatation or surrounding inflammatory changes. Spleen: Normal in size without focal abnormality. Adrenals/Urinary Tract: Normal adrenal glands. No kidney mass or hydronephrosis. Urinary bladder is unremarkable. Stomach/Bowel: Stomach appears normal. Again noted is mild fluid dilatation scattered small bowel loops which measure up to 2.7 cm. Transition point is in the right lower quadrant of the abdomen, similar to the previous exam, image 73/2. Imaging findings remain concerning for partial small bowel obstruction. No pathologic dilatation of the colon. Vascular/Lymphatic: Aortic atherosclerosis without aneurysm. No abdominopelvic adenopathy. Reproductive: Status post hysterectomy. Pessary device identified within the vaginal apex. Other: There is a small volume of ascites within the dependent portion of the pelvis. Peritoneal nodularity is again noted compatible with peritoneal carcinomatosis. -Soft tissue implant within the ventral right lower quadrant of the abdomen measures 1.8 x 4.0 cm, unchanged from the previous exam, image 54/2. -Left abdominal peritoneal implant measures 0.9 cm, image 49/2. Previously this measured the same. -Adjacent nodule measures 1.5 x 1.0 cm, image 51/2. Previously 1.0 x 0.7 cm. -Nodule in the left upper quadrant of the abdomen measures 0.7 cm, image 30/2. Unchanged from previous exam. Musculoskeletal: No acute or significant osseous findings. IMPRESSION: 1. Similar appearance of partial small bowel obstruction with transition point in the right lower quadrant of the abdomen. The degree of small-bowel distention appears unchanged from previous exam. 2. Similar appearance of peritoneal carcinomatosis. 3. Moderate to large right pleural effusion with atelectasis of the right lower lobe and right middle lobe. Unchanged from previous exam. 4. Aortic Atherosclerosis (ICD10-I70.0). Electronically Signed   By: Kerby Moors M.D.   On: 02/27/2022 06:11     DISCHARGE EXAMINATION: Vitals:   03/01/22 1015 03/01/22 1058 03/01/22 1340 03/02/22 1350  BP:  (!) 136/58 (!) 184/61 138/63  Pulse:  61  66  Resp:  16    Temp:  97.9 F (36.6 C)  97.9 F (36.6 C)  TempSrc:  Oral  Oral  SpO2: 92% 95%  94%  Weight:      Height:       General appearance: Awake alert.  In no distress Resp: Improved air entry bilaterally especially on the right. Cardio: S1-S2 is normal regular.  No S3-S4.  No rubs murmurs or bruit GI: Abdomen is soft.  Nontender nondistended.  Bowel sounds are present normal.  No masses organomegaly   DISPOSITION: Home  Discharge Instructions     Call MD for:  difficulty breathing, headache or visual disturbances   Complete by: As directed    Call MD for:  extreme fatigue   Complete by: As directed    Call MD for:  persistant dizziness or light-headedness   Complete by: As directed    Call MD for:  persistant nausea and vomiting   Complete by: As directed    Call MD for:  severe uncontrolled pain   Complete by: As directed    Call MD for:  temperature >100.4   Complete by: As directed    Discharge instructions   Complete by: As directed    Please take your medications as prescribed.  Try to avoid constipation as much as possible.  Follow-up with your hospice and primary care providers as needed. Please stay on liquid diet for the next 48 hours as discussed and then you can have soft diet subsequently.  You were cared for by a hospitalist during your hospital stay. If you have any questions about your discharge medications or the care you received while you were in the hospital after you are discharged, you can call the unit and asked to speak with the hospitalist on call if the hospitalist that took care of you is not available. Once you are discharged, your primary care physician will handle any further medical issues. Please note that NO REFILLS for any discharge medications will be authorized once you are discharged, as it  is imperative that you return to your primary care physician (or establish a relationship with a primary care physician if you do not have one) for your aftercare needs so that they can reassess your need for medications and monitor your lab values. If you do not have a primary care physician, you can call [phone] for a physician referral.   Increase activity slowly   Complete by: As directed          Allergies as of 03/02/2022   No Known Allergies      Medication List     STOP taking these medications    ciprofloxacin 250 MG tablet Commonly known as: Cipro       TAKE these medications    acetaminophen 500 MG tablet Commonly known as: TYLENOL Take 1,000 mg by mouth every 6 (six) hours as needed for mild pain or fever.   anti-nausea solution Take 10 mLs by mouth every 15 (fifteen) minutes as needed for nausea or vomiting.   apixaban 5 MG Tabs tablet Commonly known as: Eliquis Take 1 tablet (5 mg total) by mouth 2 (two) times daily.   atenolol 25 MG tablet Commonly known as: TENORMIN TAKE ONE (1) TABLET BY MOUTH EVERY DAY What changed: how much to take   bisacodyl 5 MG EC tablet Commonly known as: DULCOLAX Take 1 tablet (5 mg total) by mouth daily as needed for moderate constipation. What changed:  how much to take when to take this   dexamethasone 4 MG tablet Commonly known as: DECADRON Take 1 tablet (4 mg total) by mouth daily.   fentaNYL 25 MCG/HR Commonly known as: Masthope 1 patch onto the skin every 3 (three) days.   levothyroxine 75 MCG tablet Commonly known as: SYNTHROID Take 1 tablet (75 mcg total) by mouth daily before breakfast.   lidocaine-prilocaine cream Commonly known as: EMLA Apply 1 application topically daily as needed (port access).   LORazepam 0.5 MG tablet Commonly known as: ATIVAN Take 0.5-1 mg by mouth every 4 (four) hours as needed for anxiety (for nausea).   mometasone 50 MCG/ACT nasal spray Commonly known as:  NASONEX Place 2 sprays into the nose daily as needed (allergies).   ondansetron 4 MG disintegrating tablet Commonly known as: ZOFRAN-ODT Take 1 tablet (4 mg total) by mouth every 8 (eight) hours as needed for nausea or vomiting.   oxyCODONE 5 MG immediate release tablet Commonly known as: Oxy IR/ROXICODONE TAKE 1 TABLET BY MOUTH EVERY 4 HOURS AS NEEDED FOR SEVERE PAIN   polyethylene glycol 17 g packet Commonly known as: MIRALAX / GLYCOLAX Take 17 g by mouth daily as needed for mild constipation.   senna-docusate 8.6-50 MG tablet Commonly known as: Senokot-S Take 2  tablets by mouth 2 (two) times daily.   Voltaren 1 % Gel Generic drug: diclofenac Sodium Apply 1 application. topically daily as needed (arthritis pain).          Follow-up Information     Caryl Bis, MD. Call.   Specialty: Family Medicine Why: As needed Contact information: [address] [place] [address] [phone]                 TOTAL DISCHARGE TIME: 65 minutes  [place]  [facility] Pager on [email or web address]  03/03/2022, 11:35 AM

## 2022-03-02 NOTE — TOC Transition Note (Signed)
Transition of Care [facility]) - CM/SW Discharge Note   Patient Details  Name: Paula Peterson MRN: [id number] Date of Birth: 06/16/1941  [facility] CM/SW Contact:  Dessa Phi, RN Phone Number: 03/02/2022, 12:07 PM   Clinical Narrative: [facility]-spoke to rep Beth aware of d/c. Family to transport home on own. No further CM needs.      Final next level of care: Home w Hospice Care Barriers to Discharge: No Barriers Identified   Patient Goals and CMS Choice        Discharge Placement                       Discharge Plan and Services   Discharge Planning Services: CM Consult                                 Social Determinants of Health (SDOH) Interventions     Readmission Risk Interventions    01/30/2022   10:53 AM  Readmission Risk Prevention Plan  Transportation Screening Complete  Medication Review ([place]) Complete  PCP or Specialist appointment within 3-5 days of discharge Complete  HRI or [place] Complete  SW Recovery Care/Counseling Consult Complete  Palliative Care Screening Complete  [place] Not Applicable

## 2022-03-03 ENCOUNTER — Encounter: Payer: Self-pay | Admitting: Hematology and Oncology

## 2022-03-05 NOTE — Patient Outreach (Signed)
Paula Peterson Mar 08, 1941 [id number]   Referral: [facility] Readmission Report  [place] [facility] [[facility]] Patient: [employer]   Primary Care Provider:  Caryl Bis, MD  Electronic Medical Record was reviewed for the [facility] readmission report for a readmission less than 30 days for patient at [facility] 02/27/22 to 03/02/22  Brief chart review of admission with MD Discharge Summary and inpatient [facility] team notes, and [place] Management encounter  Plan:  Patient noted to be under the care of [facility] and no [facility] Care Coordination needs noted.  For questions, please contact:  Natividad Brood, RN BSN [place] [facility] Liaison  [phone] business mobile phone Toll free office [phone]  Fax number: [phone] [email or web address]

## 2022-03-08 ENCOUNTER — Emergency Department (HOSPITAL_COMMUNITY)

## 2022-03-08 ENCOUNTER — Inpatient Hospital Stay (HOSPITAL_COMMUNITY)
Admission: EM | Admit: 2022-03-08 | Discharge: 2022-03-17 | DRG: 982 | Disposition: A | Discharge status: Hospice | Attending: Family Medicine | Admitting: Family Medicine

## 2022-03-08 ENCOUNTER — Other Ambulatory Visit: Payer: Self-pay

## 2022-03-08 ENCOUNTER — Encounter (HOSPITAL_COMMUNITY): Payer: Self-pay | Admitting: Emergency Medicine

## 2022-03-08 ENCOUNTER — Encounter: Payer: Self-pay | Admitting: Hematology and Oncology

## 2022-03-08 DIAGNOSIS — I440 Atrioventricular block, first degree: Secondary | ICD-10-CM | POA: Diagnosis present

## 2022-03-08 DIAGNOSIS — Z8543 Personal history of malignant neoplasm of ovary: Secondary | ICD-10-CM

## 2022-03-08 DIAGNOSIS — Z8489 Family history of other specified conditions: Secondary | ICD-10-CM

## 2022-03-08 DIAGNOSIS — N3001 Acute cystitis with hematuria: Secondary | ICD-10-CM

## 2022-03-08 DIAGNOSIS — M4854XA Collapsed vertebra, not elsewhere classified, thoracic region, initial encounter for fracture: Secondary | ICD-10-CM | POA: Diagnosis present

## 2022-03-08 DIAGNOSIS — K56609 Unspecified intestinal obstruction, unspecified as to partial versus complete obstruction: Secondary | ICD-10-CM | POA: Diagnosis not present

## 2022-03-08 DIAGNOSIS — Z820 Family history of epilepsy and other diseases of the nervous system: Secondary | ICD-10-CM

## 2022-03-08 DIAGNOSIS — Z86711 Personal history of pulmonary embolism: Secondary | ICD-10-CM

## 2022-03-08 DIAGNOSIS — Z8673 Personal history of transient ischemic attack (TIA), and cerebral infarction without residual deficits: Secondary | ICD-10-CM

## 2022-03-08 DIAGNOSIS — K219 Gastro-esophageal reflux disease without esophagitis: Secondary | ICD-10-CM | POA: Diagnosis present

## 2022-03-08 DIAGNOSIS — G893 Neoplasm related pain (acute) (chronic): Secondary | ICD-10-CM | POA: Diagnosis present

## 2022-03-08 DIAGNOSIS — B9620 Unspecified Escherichia coli [E. coli] as the cause of diseases classified elsewhere: Secondary | ICD-10-CM | POA: Diagnosis present

## 2022-03-08 DIAGNOSIS — C561 Malignant neoplasm of right ovary: Secondary | ICD-10-CM | POA: Diagnosis present

## 2022-03-08 DIAGNOSIS — C786 Secondary malignant neoplasm of retroperitoneum and peritoneum: Secondary | ICD-10-CM | POA: Diagnosis not present

## 2022-03-08 DIAGNOSIS — I10 Essential (primary) hypertension: Secondary | ICD-10-CM | POA: Diagnosis present

## 2022-03-08 DIAGNOSIS — I2699 Other pulmonary embolism without acute cor pulmonale: Secondary | ICD-10-CM | POA: Diagnosis present

## 2022-03-08 DIAGNOSIS — Z7901 Long term (current) use of anticoagulants: Secondary | ICD-10-CM

## 2022-03-08 DIAGNOSIS — N390 Urinary tract infection, site not specified: Secondary | ICD-10-CM | POA: Diagnosis present

## 2022-03-08 DIAGNOSIS — Z79890 Hormone replacement therapy (postmenopausal): Secondary | ICD-10-CM

## 2022-03-08 DIAGNOSIS — Z79899 Other long term (current) drug therapy: Secondary | ICD-10-CM

## 2022-03-08 DIAGNOSIS — Z66 Do not resuscitate: Secondary | ICD-10-CM | POA: Diagnosis present

## 2022-03-08 DIAGNOSIS — Z515 Encounter for palliative care: Secondary | ICD-10-CM

## 2022-03-08 DIAGNOSIS — J90 Pleural effusion, not elsewhere classified: Secondary | ICD-10-CM | POA: Diagnosis not present

## 2022-03-08 DIAGNOSIS — D696 Thrombocytopenia, unspecified: Secondary | ICD-10-CM | POA: Diagnosis present

## 2022-03-08 DIAGNOSIS — Z86718 Personal history of other venous thrombosis and embolism: Secondary | ICD-10-CM

## 2022-03-08 DIAGNOSIS — K760 Fatty (change of) liver, not elsewhere classified: Secondary | ICD-10-CM | POA: Diagnosis not present

## 2022-03-08 DIAGNOSIS — Z800 Family history of malignant neoplasm of digestive organs: Secondary | ICD-10-CM

## 2022-03-08 MED ORDER — ONDANSETRON HCL 4 MG/2ML IJ SOLN
4.0000 mg | Freq: Once | INTRAMUSCULAR | Status: AC
  Administered 2022-03-08: 4 mg via INTRAVENOUS
  Filled 2022-03-08: qty 2

## 2022-03-08 MED ORDER — SODIUM CHLORIDE 0.9 % IV BOLUS
1000.0000 mL | Freq: Once | INTRAVENOUS | Status: AC
  Administered 2022-03-08: 1000 mL via INTRAVENOUS

## 2022-03-08 MED ORDER — HYDROMORPHONE HCL 1 MG/ML IJ SOLN
1.0000 mg | Freq: Once | INTRAMUSCULAR | Status: AC
  Administered 2022-03-08: 1 mg via INTRAVENOUS
  Filled 2022-03-08: qty 1

## 2022-03-08 NOTE — ED Provider Notes (Signed)
[facility] DEPT Provider Note: Georgena Spurling, MD, FACEP  CSN: [id number] MRN: [id number] ARRIVAL: 03/08/22 at 2157 ROOM: [place]  Vomiting   HISTORY OF PRESENT ILLNESS  03/08/22 11:16 PM Paula Peterson is a 81 y.o. female on home hospice care for ovarian cancer.  She has a chronic partial small bowel obstruction and was admitted for this about a week ago.  She is here with nausea and vomiting that began this afternoon along with abdominal pain.  The abdominal pain is across her lower abdomen which is constant and crampy and she describes it as severe.  It is somewhat worse with movement or palpation.  She is also having a new epigastric pain which she has not had before.  She also had a recurrent right pleural effusion.    Past Medical History:  Diagnosis Date   Back pain    DVT (deep venous thrombosis) ([place]) 02/02/2019   had DVT first that moved into lung   GERD (gastroesophageal reflux disease)    History of pulmonary embolus (PE) 02/02/2019   confirmed by CT chest- had DVT first that moved into lungs per patient   Hypertension    Ovarian cancer ([place])    Pessary maintenance    Stroke [facility])     Past Surgical History:  Procedure Laterality Date   ABDOMINAL HYSTERECTOMY     ovaries left   BACK SURGERY     DEBULKING Bilateral 06/06/2019   Procedure: RADICAL TUMOR DEBULKING;  Surgeon: Everitt Amber, MD;  Location: [facility] ORS;  Service: Gynecology;  Laterality: Bilateral;   FRACTURE SURGERY     IR IMAGING GUIDED PORT INSERTION  12/15/2018   OMENTECTOMY Bilateral 06/06/2019   Procedure: OMENTECTOMY;  Surgeon: Everitt Amber, MD;  Location: [facility] ORS;  Service: Gynecology;  Laterality: Bilateral;   SALPINGOOPHORECTOMY Bilateral 06/06/2019   Procedure: EXPLORATORY LAPAROTOMY,OPEN RIGHT SALPINGO OOPHORECTOMY;  Surgeon: Everitt Amber, MD;  Location: [facility] ORS;  Service: Gynecology;  Laterality: Bilateral;    Family History  Problem Relation Age of Onset   Stomach cancer  Sister        dx 50s-50s   Dementia Mother    Cancer Sister        unk type, hysterectomy, dx 35s    Social History   Tobacco Use   Smoking status: Never    Passive exposure: Never   Smokeless tobacco: Never  Vaping Use   Vaping Use: Never used  Substance Use Topics   Alcohol use: No   Drug use: No    Prior to Admission medications   Medication Sig Start Date End Date Taking? Authorizing Provider  acetaminophen (TYLENOL) 500 MG tablet Take 1,000 mg by mouth every 6 (six) hours as needed for mild pain or fever.    [provider]  anti-nausea (EMETROL) solution Take 10 mLs by mouth every 15 (fifteen) minutes as needed for nausea or vomiting.    [provider]  apixaban (ELIQUIS) 5 MG TABS tablet Take 1 tablet (5 mg total) by mouth 2 (two) times daily. 10/01/21   [name], MD  atenolol (TENORMIN) 25 MG tablet TAKE ONE (1) TABLET BY MOUTH EVERY DAY Patient taking differently: Take 25 mg by mouth daily. 01/26/22   [name], MD  bisacodyl (DULCOLAX) 5 MG EC tablet Take 1 tablet (5 mg total) by mouth daily as needed for moderate constipation. Patient taking differently: Take 5-10 mg by mouth daily. 01/30/22   Amin, Jeanella Flattery, MD  dexamethasone (DECADRON) 4  MG tablet Take 1 tablet (4 mg total) by mouth daily. 01/31/22   Amin, Jeanella Flattery, MD  diclofenac Sodium (VOLTAREN) 1 % GEL Apply 1 application. topically daily as needed (arthritis pain).    [provider]  fentaNYL (DURAGESIC) 25 MCG/HR Place 1 patch onto the skin every 3 (three) days. 02/02/22   Damita Lack, MD  levothyroxine (SYNTHROID) 75 MCG tablet Take 1 tablet (75 mcg total) by mouth daily before breakfast. 07/18/20   Denton Brick, Courage, MD  lidocaine-prilocaine (EMLA) cream Apply 1 application topically daily as needed (port access). 07/22/21   [name], MD  LORazepam (ATIVAN) 0.5 MG tablet Take 0.5-1 mg by mouth every 4 (four) hours as needed for anxiety (for nausea). 02/18/22   [provider]  mometasone (NASONEX) 50 MCG/ACT nasal spray Place 2 sprays into the nose daily as needed (allergies).    [provider]  ondansetron (ZOFRAN-ODT) 4 MG disintegrating tablet Take 1 tablet (4 mg total) by mouth every 8 (eight) hours as needed for nausea or vomiting. 01/30/22   Amin, Ankit Chirag, MD  oxyCODONE (OXY IR/ROXICODONE) 5 MG immediate release tablet TAKE 1 TABLET BY MOUTH EVERY 4 HOURS AS NEEDED FOR SEVERE PAIN 01/20/22   [name], MD  polyethylene glycol (MIRALAX / GLYCOLAX) 17 g packet Take 17 g by mouth daily as needed for mild constipation. 01/30/22   Amin, Ankit Chirag, MD  senna-docusate (SENOKOT-S) 8.6-50 MG tablet Take 2 tablets by mouth 2 (two) times daily. 03/02/22   Bonnielee Haff, MD    Allergies Patient has no known allergies.   REVIEW OF SYSTEMS  Negative except as noted here or in the History of Present Illness.   PHYSICAL EXAMINATION  Initial Vital Signs Blood pressure (!) 144/90, pulse 77, temperature 97.9 F (36.6 C), temperature source Oral, resp. rate 16, height '5\' 7"'$  (1.702 m), weight 95.3 kg, SpO2 96 %.  Examination General: Well-developed, well-nourished female in no acute distress; appearance consistent with age of record HENT: normocephalic; atraumatic Eyes: pupils equal, round and reactive to light; extraocular muscles intact; bilateral pseudophakia Neck: supple Heart: regular rate and rhythm Lungs: Decreased sounds right base Chest: Port-A-Cath right upper chest Abdomen: soft; distended; diffusely tender; bowel sounds hypoactive Extremities: No deformity; full range of motion; pulses normal Neurologic: Awake, alert and oriented; motor function intact in all extremities and symmetric; no facial droop Skin: Warm and dry Psychiatric: Grimacing   RESULTS  Summary of this visit's results, reviewed and interpreted by myself:   EKG Interpretation  Date/Time:  Sunday March 08 2022 22:50:09 EDT Ventricular Rate:  74 PR  Interval:  197 QRS Duration: 91 QT Interval:  363 QTC Calculation: 403 R Axis:   -20 Text Interpretation: Sinus rhythm with first degree AV block Borderline left axis deviation Abnormal R-wave progression, early transition Baseline wander in lead(s) V2  No significant change was found   Confirmed by Shanon Rosser [id number]) on 03/08/2022 11:16:26 PM       Laboratory Studies: Results for orders placed or performed during the hospital encounter of 03/08/22 (from the past 24 hour(s))  CBC with Differential     Status: Abnormal   Collection Time: 03/08/22 11:26 PM  Result Value Ref Range   WBC 7.7 4.0 - 10.5 K/uL   RBC 3.73 (L) 3.87 - 5.11 MIL/uL   Hemoglobin 12.8 12.0 - 15.0 g/dL   HCT 38.9 36.0 - 46.0 %   MCV 104.3 (H) 80.0 - 100.0 fL   MCH 34.3 (H) 26.0 -  34.0 pg   MCHC 32.9 30.0 - 36.0 g/dL   RDW 14.8 11.5 - 15.5 %   Platelets 138 (L) 150 - 400 K/uL   nRBC 0.3 (H) 0.0 - 0.2 %   Neutrophils Relative % 73 %   Neutro Abs 5.6 1.7 - 7.7 K/uL   Lymphocytes Relative 19 %   Lymphs Abs 1.5 0.7 - 4.0 K/uL   Monocytes Relative 7 %   Monocytes Absolute 0.5 0.1 - 1.0 K/uL   Eosinophils Relative 0 %   Eosinophils Absolute 0.0 0.0 - 0.5 K/uL   Basophils Relative 0 %   Basophils Absolute 0.0 0.0 - 0.1 K/uL   Immature Granulocytes 1 %   Abs Immature Granulocytes 0.04 0.00 - 0.07 K/uL  Comprehensive metabolic panel     Status: Abnormal   Collection Time: 03/08/22 11:26 PM  Result Value Ref Range   Sodium 139 135 - 145 mmol/L   Potassium 3.7 3.5 - 5.1 mmol/L   Chloride 104 98 - 111 mmol/L   CO2 26 22 - 32 mmol/L   Glucose, Bld 128 (H) 70 - 99 mg/dL   BUN 18 8 - 23 mg/dL   Creatinine, Ser 1.01 (H) 0.44 - 1.00 mg/dL   Calcium 10.0 8.9 - 10.3 mg/dL   Total Protein 6.8 6.5 - 8.1 g/dL   Albumin 3.4 (L) 3.5 - 5.0 g/dL   AST 22 15 - 41 U/L   ALT 22 0 - 44 U/L   Alkaline Phosphatase 63 38 - 126 U/L   Total Bilirubin 0.5 0.3 - 1.2 mg/dL   GFR, Estimated 57 (L) >60 mL/min   Anion gap 9 5 - 15   Lipase, blood     Status: None   Collection Time: 03/08/22 11:26 PM  Result Value Ref Range   Lipase 23 11 - 51 U/L  Urinalysis, Routine w reflex microscopic     Status: Abnormal   Collection Time: 03/08/22 11:26 PM  Result Value Ref Range   Color, Urine YELLOW YELLOW   APPearance CLEAR CLEAR   Specific Gravity, Urine 1.025 1.005 - 1.030   pH 7.0 5.0 - 8.0   Glucose, UA NEGATIVE NEGATIVE mg/dL   Hgb urine dipstick LARGE (A) NEGATIVE   Bilirubin Urine NEGATIVE NEGATIVE   Ketones, ur NEGATIVE NEGATIVE mg/dL   Protein, ur 100 (A) NEGATIVE mg/dL   Nitrite POSITIVE (A) NEGATIVE   Leukocytes,Ua MODERATE (A) NEGATIVE  Urinalysis, Microscopic (reflex)     Status: Abnormal   Collection Time: 03/08/22 11:26 PM  Result Value Ref Range   RBC / HPF 6-10 0 - 5 RBC/hpf   WBC, UA 6-10 0 - 5 WBC/hpf   Bacteria, UA MANY (A) NONE SEEN   Squamous Epithelial / LPF NONE SEEN 0 - 5   Imaging Studies: CT ABDOMEN PELVIS W CONTRAST  Result Date: 03/09/2022 CLINICAL DATA:  Nausea and vomiting; bowel obstruction suspected EXAM: CT ABDOMEN AND PELVIS WITH CONTRAST TECHNIQUE: Multidetector CT imaging of the abdomen and pelvis was performed using the standard protocol following bolus administration of intravenous contrast. RADIATION DOSE REDUCTION: This exam was performed according to the departmental dose-optimization program which includes automated exposure control, adjustment of the mA and/or kV according to patient size and/or use of iterative reconstruction technique. CONTRAST:  17m OMNIPAQUE IOHEXOL 300 MG/ML  SOLN COMPARISON:  CT 02/27/2022 FINDINGS: Lower chest: Moderate right pleural effusion and associated atelectasis/consolidation. Right middle lobe and lower lobe infiltrates. Coronary artery calcification. Hepatobiliary: Hepatic steatosis. Redemonstrated small area of low  attenuation in the inferior right hepatic lobe which measures 7 mm and is too small to definitively characterize. Peritoneal  metastases along the anterior liver surface are redemonstrated. Gallbladder wall nodularity compatible with metastases. No biliary ductal dilation. Pancreas: Unremarkable. No pancreatic ductal dilatation or surrounding inflammatory changes. Spleen: Normal in size without focal abnormality. Adrenals/Urinary Tract: Unremarkable adrenal glands. No urinary calculi or hydronephrosis. No focal renal lesion. Unremarkable bladder. Stomach/Bowel: Unremarkable stomach. Multiple loops of borderline dilated small bowel measuring up to 2.7 cm diameter are redemonstrated. This appears slightly increased from 02/27/2022. There are 2 adjacent transition points in the right lower abdomen (series 5/image 58 and series 5/image 67). Findings remain concerning for small bowel obstruction possibly secondary to peritoneal implants the areas of obstruction. Normal caliber of the colon. Vascular/Lymphatic: Aortic atherosclerosis. Unchanged enlargement of a 1.8 cm right inguinal lymph node. No lymphadenopathy in the abdomen or pelvis. Reproductive: Status post hysterectomy.  Pessary. Other: Small volume ascites in the pelvis, slightly increased from 02/27/2022. Redemonstrated peritoneal nodules compatible with peritoneal carcinomatosis. For example on the left on series 2/image 45. Musculoskeletal: No acute fracture. Multiple superior endplate compression fractures in the lower thoracic and lumbar spine are unchanged from 02/27/2022. IMPRESSION: 1. Slight worsening of small bowel obstruction with transition points in the right lower quadrant. 2. Similar peritoneal carcinomatosis. 3. Moderate right pleural effusion with atelectasis/infiltrates in the right middle and lower lobes. Electronically Signed   By: Placido Sou M.D.   On: 03/09/2022 02:40   DG Chest Port 1 View  Result Date: 03/09/2022 CLINICAL DATA:  Nausea and vomiting; follow-up effusion EXAM: PORTABLE CHEST 1 VIEW COMPARISON:  Radiographs 03/01/2022 FINDINGS: Stable  cardiomediastinal silhouette at upper limits of normal for size. Accessed right chest wall Port-A-Cath with tip in the low SVC. Increased size of the now moderate right pleural effusion. Associated compressive atelectasis/consolidation. The left lung is clear. No acute osseous abnormality. IMPRESSION: Increased moderate right pleural effusion since 03/01/2022. Electronically Signed   By: Placido Sou M.D.   On: 03/09/2022 00:22    ED COURSE and MDM  Nursing notes, initial and subsequent vitals signs, including pulse oximetry, reviewed and interpreted by myself.  Vitals:   03/09/22 0115 03/09/22 0145 03/09/22 0200 03/09/22 0230  BP: (!) 176/75 (!) 196/71 (!) 170/67 (!) 181/77  Pulse: (!) 59 68 67 70  Resp: 20 14 (!) 24 13  Temp:   97.7 F (36.5 C)   TempSrc:      SpO2: 95% 97% 97% 97%  Weight:      Height:       Medications  HYDROmorphone (DILAUDID) injection 0.5 mg (0.5 mg Intravenous Given 03/09/22 0157)  cefTRIAXone (ROCEPHIN) 1 g in sodium chloride 0.9 % 100 mL IVPB (has no administration in time range)  sodium chloride 0.9 % bolus 1,000 mL (0 mLs Intravenous Stopped 03/09/22 0057)  ondansetron (ZOFRAN) injection 4 mg (4 mg Intravenous Given 03/08/22 2353)  HYDROmorphone (DILAUDID) injection 1 mg (1 mg Intravenous Given 03/08/22 2355)  iohexol (OMNIPAQUE) 300 MG/ML solution 100 mL (100 mLs Intravenous Contrast Given 03/09/22 0205)  sodium chloride (PF) 0.9 % injection (  Given by Other 03/09/22 0148)   2:50 AM The patient's small bowel obstruction appears to have worsened which is consistent with her worsened clinical picture.  She also appears to have a urinary tract infection and we will start Rocephin for this.  Urine has been sent for culture.  Her right pleural effusion has recurred as well.  We will hold  off on NG tube as patient has not been vomiting.   3:13 AM Dr. Myna Hidalgo to admit.  PROCEDURES  Procedures   ED DIAGNOSES     ICD-10-CM   1. SBO (small bowel obstruction)  ([place])  K56.609     2. Recurrent right pleural effusion  J90     3. Acute cystitis with hematuria  N30.01          Aubriella Perezgarcia, Jenny Reichmann, MD 03/09/22 [phone]

## 2022-03-08 NOTE — ED Triage Notes (Signed)
Patient c/o N/V this evening. Ativan and oxy at home per daughter. Hx SBO and cancer in abdomen. Daughter reports treatments stopped in June with hospice at home since that time.

## 2022-03-09 ENCOUNTER — Emergency Department (HOSPITAL_COMMUNITY)

## 2022-03-09 ENCOUNTER — Encounter: Payer: Self-pay | Admitting: Hematology and Oncology

## 2022-03-09 ENCOUNTER — Encounter (HOSPITAL_COMMUNITY): Payer: Self-pay | Admitting: Emergency Medicine

## 2022-03-09 DIAGNOSIS — R52 Pain, unspecified: Secondary | ICD-10-CM | POA: Diagnosis not present

## 2022-03-09 DIAGNOSIS — I440 Atrioventricular block, first degree: Secondary | ICD-10-CM | POA: Diagnosis present

## 2022-03-09 DIAGNOSIS — K56609 Unspecified intestinal obstruction, unspecified as to partial versus complete obstruction: Secondary | ICD-10-CM | POA: Diagnosis not present

## 2022-03-09 DIAGNOSIS — Z86711 Personal history of pulmonary embolism: Secondary | ICD-10-CM | POA: Diagnosis not present

## 2022-03-09 DIAGNOSIS — K219 Gastro-esophageal reflux disease without esophagitis: Secondary | ICD-10-CM | POA: Diagnosis present

## 2022-03-09 DIAGNOSIS — C561 Malignant neoplasm of right ovary: Secondary | ICD-10-CM | POA: Diagnosis present

## 2022-03-09 DIAGNOSIS — Z8489 Family history of other specified conditions: Secondary | ICD-10-CM | POA: Diagnosis not present

## 2022-03-09 DIAGNOSIS — N390 Urinary tract infection, site not specified: Secondary | ICD-10-CM | POA: Diagnosis present

## 2022-03-09 DIAGNOSIS — J9811 Atelectasis: Secondary | ICD-10-CM | POA: Diagnosis not present

## 2022-03-09 DIAGNOSIS — Z66 Do not resuscitate: Secondary | ICD-10-CM | POA: Diagnosis present

## 2022-03-09 DIAGNOSIS — B9620 Unspecified Escherichia coli [E. coli] as the cause of diseases classified elsewhere: Secondary | ICD-10-CM | POA: Diagnosis present

## 2022-03-09 DIAGNOSIS — Z86718 Personal history of other venous thrombosis and embolism: Secondary | ICD-10-CM | POA: Diagnosis not present

## 2022-03-09 DIAGNOSIS — G893 Neoplasm related pain (acute) (chronic): Secondary | ICD-10-CM | POA: Diagnosis present

## 2022-03-09 DIAGNOSIS — Z8673 Personal history of transient ischemic attack (TIA), and cerebral infarction without residual deficits: Secondary | ICD-10-CM | POA: Diagnosis not present

## 2022-03-09 DIAGNOSIS — Z515 Encounter for palliative care: Secondary | ICD-10-CM | POA: Diagnosis not present

## 2022-03-09 DIAGNOSIS — K5669 Other partial intestinal obstruction: Secondary | ICD-10-CM | POA: Diagnosis not present

## 2022-03-09 DIAGNOSIS — Z7189 Other specified counseling: Secondary | ICD-10-CM | POA: Diagnosis not present

## 2022-03-09 DIAGNOSIS — Z800 Family history of malignant neoplasm of digestive organs: Secondary | ICD-10-CM | POA: Diagnosis not present

## 2022-03-09 DIAGNOSIS — I10 Essential (primary) hypertension: Secondary | ICD-10-CM | POA: Diagnosis present

## 2022-03-09 DIAGNOSIS — K760 Fatty (change of) liver, not elsewhere classified: Secondary | ICD-10-CM | POA: Diagnosis not present

## 2022-03-09 DIAGNOSIS — I2699 Other pulmonary embolism without acute cor pulmonale: Secondary | ICD-10-CM | POA: Diagnosis not present

## 2022-03-09 DIAGNOSIS — Z8543 Personal history of malignant neoplasm of ovary: Secondary | ICD-10-CM | POA: Diagnosis not present

## 2022-03-09 DIAGNOSIS — J939 Pneumothorax, unspecified: Secondary | ICD-10-CM | POA: Diagnosis not present

## 2022-03-09 DIAGNOSIS — Z79890 Hormone replacement therapy (postmenopausal): Secondary | ICD-10-CM | POA: Diagnosis not present

## 2022-03-09 DIAGNOSIS — N3001 Acute cystitis with hematuria: Secondary | ICD-10-CM

## 2022-03-09 DIAGNOSIS — Z79899 Other long term (current) drug therapy: Secondary | ICD-10-CM | POA: Diagnosis not present

## 2022-03-09 DIAGNOSIS — Z7901 Long term (current) use of anticoagulants: Secondary | ICD-10-CM | POA: Diagnosis not present

## 2022-03-09 DIAGNOSIS — J90 Pleural effusion, not elsewhere classified: Secondary | ICD-10-CM

## 2022-03-09 DIAGNOSIS — Z820 Family history of epilepsy and other diseases of the nervous system: Secondary | ICD-10-CM | POA: Diagnosis not present

## 2022-03-09 DIAGNOSIS — M4854XA Collapsed vertebra, not elsewhere classified, thoracic region, initial encounter for fracture: Secondary | ICD-10-CM | POA: Diagnosis present

## 2022-03-09 DIAGNOSIS — K56600 Partial intestinal obstruction, unspecified as to cause: Secondary | ICD-10-CM | POA: Diagnosis not present

## 2022-03-09 DIAGNOSIS — D696 Thrombocytopenia, unspecified: Secondary | ICD-10-CM | POA: Diagnosis present

## 2022-03-09 DIAGNOSIS — C786 Secondary malignant neoplasm of retroperitoneum and peritoneum: Secondary | ICD-10-CM | POA: Diagnosis not present

## 2022-03-09 LAB — CBC WITH DIFFERENTIAL/PLATELET
Abs Immature Granulocytes: 0.04 10*3/uL (ref 0.00–0.07)
Basophils Absolute: 0 10*3/uL (ref 0.0–0.1)
Basophils Relative: 0 %
Eosinophils Absolute: 0 10*3/uL (ref 0.0–0.5)
Eosinophils Relative: 0 %
HCT: 38.9 % (ref 36.0–46.0)
Hemoglobin: 12.8 g/dL (ref 12.0–15.0)
Immature Granulocytes: 1 %
Lymphocytes Relative: 19 %
Lymphs Abs: 1.5 10*3/uL (ref 0.7–4.0)
MCH: 34.3 pg — ABNORMAL HIGH (ref 26.0–34.0)
MCHC: 32.9 g/dL (ref 30.0–36.0)
MCV: 104.3 fL — ABNORMAL HIGH (ref 80.0–100.0)
Monocytes Absolute: 0.5 10*3/uL (ref 0.1–1.0)
Monocytes Relative: 7 %
Neutro Abs: 5.6 10*3/uL (ref 1.7–7.7)
Neutrophils Relative %: 73 %
Platelets: 138 10*3/uL — ABNORMAL LOW (ref 150–400)
RBC: 3.73 MIL/uL — ABNORMAL LOW (ref 3.87–5.11)
RDW: 14.8 % (ref 11.5–15.5)
WBC: 7.7 10*3/uL (ref 4.0–10.5)
nRBC: 0.3 % — ABNORMAL HIGH (ref 0.0–0.2)

## 2022-03-09 LAB — URINALYSIS, ROUTINE W REFLEX MICROSCOPIC
Bilirubin Urine: NEGATIVE
Glucose, UA: NEGATIVE mg/dL
Ketones, ur: NEGATIVE mg/dL
Nitrite: POSITIVE — AB
Protein, ur: 100 mg/dL — AB
Specific Gravity, Urine: 1.025 (ref 1.005–1.030)
pH: 7 (ref 5.0–8.0)

## 2022-03-09 LAB — COMPREHENSIVE METABOLIC PANEL
ALT: 22 U/L (ref 0–44)
AST: 22 U/L (ref 15–41)
Albumin: 3.4 g/dL — ABNORMAL LOW (ref 3.5–5.0)
Alkaline Phosphatase: 63 U/L (ref 38–126)
Anion gap: 9 (ref 5–15)
BUN: 18 mg/dL (ref 8–23)
CO2: 26 mmol/L (ref 22–32)
Calcium: 10 mg/dL (ref 8.9–10.3)
Chloride: 104 mmol/L (ref 98–111)
Creatinine, Ser: 1.01 mg/dL — ABNORMAL HIGH (ref 0.44–1.00)
GFR, Estimated: 57 mL/min — ABNORMAL LOW (ref >60)
Glucose, Bld: 128 mg/dL — ABNORMAL HIGH (ref 70–99)
Potassium: 3.7 mmol/L (ref 3.5–5.1)
Sodium: 139 mmol/L (ref 135–145)
Total Bilirubin: 0.5 mg/dL (ref 0.3–1.2)
Total Protein: 6.8 g/dL (ref 6.5–8.1)

## 2022-03-09 LAB — URINALYSIS, MICROSCOPIC (REFLEX): Squamous Epithelial / HPF: NONE SEEN (ref 0–5)

## 2022-03-09 LAB — LIPASE, BLOOD: Lipase: 23 U/L (ref 11–51)

## 2022-03-09 LAB — PROTIME-INR
INR: 1 (ref 0.8–1.2)
Prothrombin Time: 13.2 seconds (ref 11.4–15.2)

## 2022-03-09 MED ORDER — SODIUM CHLORIDE 0.9% FLUSH
10.0000 mL | INTRAVENOUS | Status: DC | PRN
  Administered 2022-03-10: 10 mL

## 2022-03-09 MED ORDER — SODIUM CHLORIDE 0.9 % IV SOLN
1.0000 g | Freq: Once | INTRAVENOUS | Status: AC
  Administered 2022-03-09: 1 g via INTRAVENOUS
  Filled 2022-03-09: qty 10

## 2022-03-09 MED ORDER — FENTANYL 25 MCG/HR TD PT72
1.0000 | MEDICATED_PATCH | TRANSDERMAL | Status: DC
  Filled 2022-03-09: qty 1

## 2022-03-09 MED ORDER — IOHEXOL 300 MG/ML  SOLN
100.0000 mL | Freq: Once | INTRAMUSCULAR | Status: AC | PRN
Start: 2022-03-09 — End: 2022-03-09
  Administered 2022-03-09: 100 mL via INTRAVENOUS

## 2022-03-09 MED ORDER — ONDANSETRON HCL 4 MG PO TABS: 4.0000 mg | ORAL_TABLET | Freq: Four times a day (QID) | ORAL | Status: DC | PRN

## 2022-03-09 MED ORDER — HYDROMORPHONE HCL 1 MG/ML IJ SOLN
0.5000 mg | INTRAMUSCULAR | Status: DC | PRN
  Administered 2022-03-09 – 2022-03-11 (×18): 1 mg via INTRAVENOUS
  Filled 2022-03-09 (×18): qty 1

## 2022-03-09 MED ORDER — SODIUM CHLORIDE 0.9 % IV SOLN
1.0000 g | INTRAVENOUS | Status: DC
  Administered 2022-03-10 – 2022-03-12 (×3): 1 g via INTRAVENOUS
  Filled 2022-03-09 (×3): qty 10

## 2022-03-09 MED ORDER — SODIUM CHLORIDE 0.9 % IV SOLN: 12.5000 mg | Freq: Four times a day (QID) | INTRAVENOUS | Status: DC | PRN

## 2022-03-09 MED ORDER — HYDROMORPHONE HCL 1 MG/ML IJ SOLN
0.5000 mg | INTRAMUSCULAR | Status: DC | PRN
  Administered 2022-03-09 – 2022-03-12 (×4): 0.5 mg via INTRAVENOUS
  Filled 2022-03-09: qty 0.5
  Filled 2022-03-09: qty 1
  Filled 2022-03-09 (×2): qty 0.5

## 2022-03-09 MED ORDER — SODIUM CHLORIDE (PF) 0.9 % IJ SOLN
INTRAMUSCULAR | Status: AC
  Filled 2022-03-09: qty 50

## 2022-03-09 MED ORDER — CHLORHEXIDINE GLUCONATE CLOTH 2 % EX PADS
6.0000 | MEDICATED_PAD | Freq: Every day | CUTANEOUS | Status: DC
Start: 2022-03-09 — End: 2022-03-17
  Administered 2022-03-09 – 2022-03-17 (×9): 6 via TOPICAL

## 2022-03-09 MED ORDER — ONDANSETRON HCL 4 MG/2ML IJ SOLN
4.0000 mg | Freq: Four times a day (QID) | INTRAMUSCULAR | Status: DC | PRN
  Administered 2022-03-09: 4 mg via INTRAVENOUS
  Filled 2022-03-09: qty 2

## 2022-03-09 MED ORDER — LABETALOL HCL 5 MG/ML IV SOLN
10.0000 mg | INTRAVENOUS | Status: DC | PRN
Start: 2022-03-09 — End: 2022-03-17

## 2022-03-09 NOTE — Progress Notes (Signed)
Referring Physician(s): Opyd,T/Gorsuch,N  Supervising Physician: Jacqulynn Cadet  Patient Status:  [facility] - In-pt  Chief Complaint: Metastatic ovarian cancer, abdominal pain, dyspnea, recurrent right pleural effusion   Subjective: Pt known to IR service from right thoracenteses in 2020 and on 03/01/22, and port a cath placement in 2020. 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  She is under home hospice care.  She was admitted to [facility] 9/10 with recurrent abdominal pain, nausea, vomiting as well as dyspnea. CT A/P earlier today revealed:  1. Slight worsening of small bowel obstruction with transition points in the right lower quadrant. 2. Similar peritoneal carcinomatosis. 3. Moderate right pleural effusion with atelectasis/infiltrates in the right middle and lower lobes.  She  is afebrile, on IV Rocephin for recurrent UTI; WBC normal, hemoglobin normal, platelets 138k,  PT/INR pending.  She denies fever, headache, cough, bleeding.  Request now received for right Pleurx catheter placement.  Past Medical History:  Diagnosis Date   Back pain    DVT (deep venous thrombosis) ([place]) 02/02/2019   had DVT first that moved into lung   GERD (gastroesophageal reflux disease)    History of pulmonary embolus (PE) 02/02/2019   confirmed by CT chest- had DVT first that moved into lungs per patient   Hypertension    Ovarian cancer ([place])    Pessary maintenance    Stroke [facility])    Past Surgical History:  Procedure Laterality Date   ABDOMINAL HYSTERECTOMY     ovaries left   BACK SURGERY     DEBULKING Bilateral 06/06/2019   Procedure: RADICAL TUMOR DEBULKING;  Surgeon: Everitt Amber, MD;  Location: [facility];  Service: Gynecology;  Laterality: Bilateral;   FRACTURE SURGERY     IR IMAGING GUIDED PORT INSERTION  12/15/2018   OMENTECTOMY Bilateral 06/06/2019   Procedure: OMENTECTOMY;   Surgeon: Everitt Amber, MD;  Location: [facility];  Service: Gynecology;  Laterality: Bilateral;   SALPINGOOPHORECTOMY Bilateral 06/06/2019   Procedure: EXPLORATORY LAPAROTOMY,OPEN RIGHT SALPINGO OOPHORECTOMY;  Surgeon: Everitt Amber, MD;  Location: [facility];  Service: Gynecology;  Laterality: Bilateral;     Allergies: Patient has no known allergies.  Medications: Prior to Admission medications   Medication Sig Start Date End Date Taking? Authorizing Provider  acetaminophen (TYLENOL) 500 MG tablet Take 1,000 mg by mouth every 4 (four) hours as needed for headache.   Yes [provider]  apixaban (ELIQUIS) 5 MG TABS tablet Take 1 tablet (5 mg total) by mouth 2 (two) times daily. 10/01/21  Yes Gorsuch, Ni, MD  atenolol (TENORMIN) 25 MG tablet TAKE ONE (1) TABLET BY MOUTH EVERY DAY Patient taking differently: Take 25 mg by mouth daily. 01/26/22  Yes Gorsuch, Ni, MD  bisacodyl (DULCOLAX) 5 MG EC tablet Take 1 tablet (5 mg total) by mouth daily as needed for moderate constipation. Patient taking differently: Take 10 mg by mouth daily. 01/30/22  Yes Amin, Ankit Chirag, MD  dexamethasone (DECADRON) 4 MG tablet Take 1 tablet (4 mg total) by mouth daily. 01/31/22  Yes Amin, Ankit Chirag, MD  diclofenac Sodium (VOLTAREN) 1 % GEL Apply 1 application. topically daily as needed (arthritis pain).   Yes [provider]  fentaNYL (DURAGESIC) 25 MCG/HR Place 1 patch onto the skin every 3 (three) days. 02/02/22  Yes Amin, Jeanella Flattery, MD  levothyroxine (SYNTHROID) 75 MCG tablet Take 1 tablet (75 mcg total) by mouth daily before breakfast.  07/18/20  Yes Emokpae, Courage, MD  LORazepam (ATIVAN) 0.5 MG tablet Take 0.5-1 mg by mouth daily as needed for anxiety (for nausea). 02/18/22  Yes [provider]  ondansetron (ZOFRAN-ODT) 4 MG disintegrating tablet Take 1 tablet (4 mg total) by mouth every 8 (eight) hours as needed for nausea or vomiting. Patient taking differently: Take 4-8 mg by mouth daily as  needed for nausea or vomiting. 01/30/22  Yes Amin, Ankit Chirag, MD  oxyCODONE (OXY IR/ROXICODONE) 5 MG immediate release tablet TAKE 1 TABLET BY MOUTH EVERY 4 HOURS AS NEEDED FOR SEVERE PAIN 01/20/22  Yes Gorsuch, Ni, MD  senna-docusate (SENOKOT-S) 8.6-50 MG tablet Take 2 tablets by mouth 2 (two) times daily. Patient taking differently: Take 1 tablet by mouth daily as needed for mild constipation. 03/02/22  Yes Bonnielee Haff, MD  lidocaine-prilocaine (EMLA) cream Apply 1 application topically daily as needed (port access). Patient not taking: Reported on 03/09/2022 07/22/21   [name], MD  Oxycodone HCl 10 MG TABS Take by mouth. Patient not taking: Reported on 03/09/2022    [provider]     Vital Signs: BP (!) 116/53 (BP Location: Right Arm)   Pulse 68   Temp 97.6 F (36.4 C)   Resp 18   Ht '5\' 7"'$  (1.702 m)   Wt 210 lb (95.3 kg)   SpO2 92%   BMI 32.89 kg/m   Physical Exam awake, answering questions okay.  Complaining of abdominal pain. Chest- dim BS rt base, left clear; rt chest port in place; heart- RRR; abd- soft,few BS, sl dist, diffuse tenderness to palpation; ext with FROM  Imaging: CT ABDOMEN PELVIS W CONTRAST  Result Date: 03/09/2022 CLINICAL DATA:  Nausea and vomiting; bowel obstruction suspected EXAM: CT ABDOMEN AND PELVIS WITH CONTRAST TECHNIQUE: Multidetector CT imaging of the abdomen and pelvis was performed using the standard protocol following bolus administration of intravenous contrast. RADIATION DOSE REDUCTION: This exam was performed according to the departmental dose-optimization program which includes automated exposure control, adjustment of the mA and/or kV according to patient size and/or use of iterative reconstruction technique. CONTRAST:  148m OMNIPAQUE IOHEXOL 300 MG/ML  SOLN COMPARISON:  CT 02/27/2022 FINDINGS: Lower chest: Moderate right pleural effusion and associated atelectasis/consolidation. Right middle lobe and lower lobe infiltrates. Coronary  artery calcification. Hepatobiliary: Hepatic steatosis. Redemonstrated small area of low attenuation in the inferior right hepatic lobe which measures 7 mm and is too small to definitively characterize. Peritoneal metastases along the anterior liver surface are redemonstrated. Gallbladder wall nodularity compatible with metastases. No biliary ductal dilation. Pancreas: Unremarkable. No pancreatic ductal dilatation or surrounding inflammatory changes. Spleen: Normal in size without focal abnormality. Adrenals/Urinary Tract: Unremarkable adrenal glands. No urinary calculi or hydronephrosis. No focal renal lesion. Unremarkable bladder. Stomach/Bowel: Unremarkable stomach. Multiple loops of borderline dilated small bowel measuring up to 2.7 cm diameter are redemonstrated. This appears slightly increased from 02/27/2022. There are 2 adjacent transition points in the right lower abdomen (series 5/image 58 and series 5/image 67). Findings remain concerning for small bowel obstruction possibly secondary to peritoneal implants the areas of obstruction. Normal caliber of the colon. Vascular/Lymphatic: Aortic atherosclerosis. Unchanged enlargement of a 1.8 cm right inguinal lymph node. No lymphadenopathy in the abdomen or pelvis. Reproductive: Status post hysterectomy.  Pessary. Other: Small volume ascites in the pelvis, slightly increased from 02/27/2022. Redemonstrated peritoneal nodules compatible with peritoneal carcinomatosis. For example on the left on series 2/image 45. Musculoskeletal: No acute fracture. Multiple superior endplate compression fractures in the lower thoracic  and lumbar spine are unchanged from 02/27/2022. IMPRESSION: 1. Slight worsening of small bowel obstruction with transition points in the right lower quadrant. 2. Similar peritoneal carcinomatosis. 3. Moderate right pleural effusion with atelectasis/infiltrates in the right middle and lower lobes. Electronically Signed   By: Placido Sou M.D.    On: 03/09/2022 02:40   DG Chest Port 1 View  Result Date: 03/09/2022 CLINICAL DATA:  Nausea and vomiting; follow-up effusion EXAM: PORTABLE CHEST 1 VIEW COMPARISON:  Radiographs 03/01/2022 FINDINGS: Stable cardiomediastinal silhouette at upper limits of normal for size. Accessed right chest wall Port-A-Cath with tip in the low SVC. Increased size of the now moderate right pleural effusion. Associated compressive atelectasis/consolidation. The left lung is clear. No acute osseous abnormality. IMPRESSION: Increased moderate right pleural effusion since 03/01/2022. Electronically Signed   By: Placido Sou M.D.   On: 03/09/2022 00:22    Labs:  CBC: Recent Labs    01/30/22 0223 02/27/22 0415 02/28/22 0308 03/08/22 2326  WBC 3.8* 10.9* 4.0 7.7  HGB 9.5* 13.0 11.0* 12.8  HCT 29.3* 39.5 34.3* 38.9  PLT 198 174 121* 138*    COAGS: Recent Labs    11/06/21 1715 12/22/21 1358 12/22/21 2318 12/23/21 0955 01/28/22 1655  INR 1.1  --   --   --  1.4*  APTT 30 36 113* 103* 37*    BMP: Recent Labs    01/30/22 0223 02/27/22 0415 02/28/22 0308 03/08/22 2326  NA 137 137 136 139  K 3.5 3.5 4.2 3.7  CL 107 99 105 104  CO2 '23 25 24 26  '$ GLUCOSE 100* 147* 115* 128*  BUN 13 26* 25* 18  CALCIUM 8.3* 9.9 8.7* 10.0  CREATININE 0.84 1.04* 0.89 1.01*  GFRNONAA >60 55* >60 57*    LIVER FUNCTION TESTS: Recent Labs    12/19/21 2315 02/27/22 0415 02/28/22 0308 03/08/22 2326  BILITOT 0.5 0.8 0.7 0.5  AST '18 18 16 22  '$ ALT '17 18 17 22  '$ ALKPHOS 84 72 68 63  PROT 5.4* 6.5 5.5* 6.8  ALBUMIN 2.8* 3.4* 2.8* 3.4*    Assessment and Plan: Pt known to IR service from right thoracenteses in 2020 and on 03/01/22, and port a cath placement in 2020. 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  She is under home hospice care.  She was admitted to [facility] 9/10 with recurrent abdominal pain,  nausea, vomiting as well as dyspnea. CT A/P earlier today revealed:  1. Slight worsening of small bowel obstruction with transition points in the right lower quadrant. 2. Similar peritoneal carcinomatosis. 3. Moderate right pleural effusion with atelectasis/infiltrates in the right middle and lower lobes.  She  is afebrile, on IV Rocephin for recurrent UTI; WBC normal, hemoglobin normal, platelets 138k,  PT/INR pending.  She denies fever, headache, cough, bleeding.  Request now received for right Pleurx catheter placement.  Imaging studies have been reviewed by Dr. Laurence Ferrari.  Details/risks of procedure, including but not limited to, internal bleeding, infection, injury to adjacent structures/pneumothorax discussed with patient and daughter with their understanding and consent.  We will tentatively plan case for later today schedule permitting.   Electronically Signed: D. Rowe Robert, PA-C 03/09/2022, 11:44 AM   I spent a total of 25 Minutes at the the patient's bedside AND on the patient's hospital floor or unit, greater than 50% of which was counseling/coordinating care for image guided right Pleurx catheter  placement    Patient ID: Paula Peterson, female   DOB: May 29, 1941, 81 y.o.   MRN: [id number]

## 2022-03-09 NOTE — ED Notes (Signed)
ED TO INPATIENT HANDOFF REPORT  Name/Age/Gender Paula Peterson 81 y.o. female  Code Status Code Status History     Date Active Date Inactive Code Status Order ID Comments User Context   02/27/2022 1919 03/02/2022 1944 DNR [id number]  Jordan Hawks, [place] Inpatient   02/27/2022 1735 02/27/2022 1919 DNR [id number]  Jonnie Finner, DO Inpatient   01/30/2022 1000 01/30/2022 2151 DNR [id number]  Lavena Bullion, NP Inpatient   01/28/2022 1731 01/30/2022 1000 Full Code [id number]  Damita Lack, MD ED   12/20/2021 0507 12/24/2021 2259 DNR [id number]  Shela Leff, MD ED   11/06/2021 1401 11/10/2021 1537 DNR [id number]  Jonnie Finner, DO ED   06/26/2021 0926 07/01/2021 1752 DNR [id number]  Jonnie Finner, DO Inpatient   06/26/2021 0312 06/26/2021 0926 Full Code [id number]  Howerter, Ethelda Chick, DO ED   07/15/2020 1953 07/17/2020 1756 Full Code [id number]  [place], [place], DO ED   06/06/2019 1131 06/09/2019 1733 Full Code [id number]  Dorothyann Gibbs, NP Inpatient   03/14/2019 1553 03/16/2019 1522 DNR [id number]  Nita Sells, MD ED   02/02/2019 2230 02/04/2019 1600 Full Code [id number]  Florencia Reasons, MD Inpatient    Questions for Most Recent Historical Code Status (Order [id number])     Question Answer   In the event of cardiac or respiratory ARREST Do not call a "code blue"   In the event of cardiac or respiratory ARREST Do not perform Intubation, CPR, defibrillation or ACLS   In the event of cardiac or respiratory ARREST Use medication by any route, position, wound care, and other measures to relive pain and suffering. May use oxygen, suction and manual treatment of airway obstruction as needed for comfort.            Home/SNF/Other Home  Chief Complaint SBO (small bowel obstruction) ([place]) [K56.609]  Level of Care/Admitting Diagnosis ED Disposition     ED Disposition  Admit   Condition  --   Comment  Hospital Area: [facility] [[id number]]  Level of Care: Med-Surg [16]  May  admit patient to [facility] or [facility] if equivalent level of care is available:: Yes  Covid Evaluation: Asymptomatic - no recent exposure (last 10 days) testing not required  Diagnosis: SBO (small bowel obstruction) [facility]) [[id number]]  Admitting Physician: Vianne Bulls [[id number]]  Attending Physician: Vianne Bulls [[id number]]  Certification:: I certify this patient will need inpatient services for at least 2 midnights  Estimated Length of Stay: 3          Medical History Past Medical History:  Diagnosis Date   Back pain    DVT (deep venous thrombosis) ([place]) 02/02/2019   had DVT first that moved into lung   GERD (gastroesophageal reflux disease)    History of pulmonary embolus (PE) 02/02/2019   confirmed by CT chest- had DVT first that moved into lungs per patient   Hypertension    Ovarian cancer ([place])    Pessary maintenance    Stroke ([place])     Allergies No Known Allergies  IV Location/Drains/Wounds Patient Lines/Drains/Airways Status     Active Line/Drains/Airways     Name Placement date Placement time Site Days   Implanted Port 01/28/22 Right Chest 01/28/22  --  Chest  40   Incision (Closed) 06/06/19 Abdomen 06/06/19  1554  -- 1007            Labs/Imaging Results for orders placed or performed during the hospital encounter of 03/08/22 (from  the past 48 hour(s))  CBC with Differential     Status: Abnormal   Collection Time: 03/08/22 11:26 PM  Result Value Ref Range   WBC 7.7 4.0 - 10.5 K/uL   RBC 3.73 (L) 3.87 - 5.11 MIL/uL   Hemoglobin 12.8 12.0 - 15.0 g/dL   HCT 38.9 36.0 - 46.0 %   MCV 104.3 (H) 80.0 - 100.0 fL   MCH 34.3 (H) 26.0 - 34.0 pg   MCHC 32.9 30.0 - 36.0 g/dL   RDW 14.8 11.5 - 15.5 %   Platelets 138 (L) 150 - 400 K/uL   nRBC 0.3 (H) 0.0 - 0.2 %   Neutrophils Relative % 73 %   Neutro Abs 5.6 1.7 - 7.7 K/uL   Lymphocytes Relative 19 %   Lymphs Abs 1.5 0.7 - 4.0 K/uL   Monocytes Relative 7 %   Monocytes Absolute 0.5 0.1 - 1.0 K/uL    Eosinophils Relative 0 %   Eosinophils Absolute 0.0 0.0 - 0.5 K/uL   Basophils Relative 0 %   Basophils Absolute 0.0 0.0 - 0.1 K/uL   Immature Granulocytes 1 %   Abs Immature Granulocytes 0.04 0.00 - 0.07 K/uL    Comment: Performed at [facility], [place] [address], [place], [place] [address]  Comprehensive metabolic panel     Status: Abnormal   Collection Time: 03/08/22 11:26 PM  Result Value Ref Range   Sodium 139 135 - 145 mmol/L   Potassium 3.7 3.5 - 5.1 mmol/L   Chloride 104 98 - 111 mmol/L   CO2 26 22 - 32 mmol/L   Glucose, Bld 128 (H) 70 - 99 mg/dL    Comment: Glucose reference range applies only to samples taken after fasting for at least 8 hours.   BUN 18 8 - 23 mg/dL   Creatinine, Ser 1.01 (H) 0.44 - 1.00 mg/dL   Calcium 10.0 8.9 - 10.3 mg/dL   Total Protein 6.8 6.5 - 8.1 g/dL   Albumin 3.4 (L) 3.5 - 5.0 g/dL   AST 22 15 - 41 U/L   ALT 22 0 - 44 U/L   Alkaline Phosphatase 63 38 - 126 U/L   Total Bilirubin 0.5 0.3 - 1.2 mg/dL   GFR, Estimated 57 (L) >60 mL/min    Comment: (NOTE) Calculated using the CKD-EPI Creatinine Equation (2021)    Anion gap 9 5 - 15    Comment: Performed at [facility], [place] [address] [place], [place] [address]  Lipase, blood     Status: None   Collection Time: 03/08/22 11:26 PM  Result Value Ref Range   Lipase 23 11 - 51 U/L    Comment: Performed at [facility], [place] [address] [place], [place] [address]  Urinalysis, Routine w reflex microscopic     Status: Abnormal   Collection Time: 03/08/22 11:26 PM  Result Value Ref Range   Color, Urine YELLOW YELLOW   APPearance CLEAR CLEAR   Specific Gravity, Urine 1.025 1.005 - 1.030   pH 7.0 5.0 - 8.0   Glucose, UA NEGATIVE NEGATIVE mg/dL   Hgb urine dipstick LARGE (A) NEGATIVE   Bilirubin Urine NEGATIVE NEGATIVE   Ketones, ur NEGATIVE NEGATIVE mg/dL   Protein, ur 100 (A) NEGATIVE mg/dL   Nitrite POSITIVE (A) NEGATIVE   Leukocytes,Ua  MODERATE (A) NEGATIVE    Comment: Performed at [facility], [place] [address], [place], [place] [address]  Urinalysis, Microscopic (reflex)     Status:  Abnormal   Collection Time: 03/08/22 11:26 PM  Result Value Ref Range   RBC / HPF 6-10 0 - 5 RBC/hpf   WBC, UA 6-10 0 - 5 WBC/hpf   Bacteria, UA MANY (A) NONE SEEN   Squamous Epithelial / LPF NONE SEEN 0 - 5    Comment: Performed at [facility], [place] [address], [place], [place] [address]   CT ABDOMEN PELVIS W CONTRAST  Result Date: 03/09/2022 CLINICAL DATA:  Nausea and vomiting; bowel obstruction suspected EXAM: CT ABDOMEN AND PELVIS WITH CONTRAST TECHNIQUE: Multidetector CT imaging of the abdomen and pelvis was performed using the standard protocol following bolus administration of intravenous contrast. RADIATION DOSE REDUCTION: This exam was performed according to the departmental dose-optimization program which includes automated exposure control, adjustment of the mA and/or kV according to patient size and/or use of iterative reconstruction technique. CONTRAST:  173m OMNIPAQUE IOHEXOL 300 MG/ML  SOLN COMPARISON:  CT 02/27/2022 FINDINGS: Lower chest: Moderate right pleural effusion and associated atelectasis/consolidation. Right middle lobe and lower lobe infiltrates. Coronary artery calcification. Hepatobiliary: Hepatic steatosis. Redemonstrated small area of low attenuation in the inferior right hepatic lobe which measures 7 mm and is too small to definitively characterize. Peritoneal metastases along the anterior liver surface are redemonstrated. Gallbladder wall nodularity compatible with metastases. No biliary ductal dilation. Pancreas: Unremarkable. No pancreatic ductal dilatation or surrounding inflammatory changes. Spleen: Normal in size without focal abnormality. Adrenals/Urinary Tract: Unremarkable adrenal glands. No urinary calculi or hydronephrosis. No focal renal lesion. Unremarkable bladder.  Stomach/Bowel: Unremarkable stomach. Multiple loops of borderline dilated small bowel measuring up to 2.7 cm diameter are redemonstrated. This appears slightly increased from 02/27/2022. There are 2 adjacent transition points in the right lower abdomen (series 5/image 58 and series 5/image 67). Findings remain concerning for small bowel obstruction possibly secondary to peritoneal implants the areas of obstruction. Normal caliber of the colon. Vascular/Lymphatic: Aortic atherosclerosis. Unchanged enlargement of a 1.8 cm right inguinal lymph node. No lymphadenopathy in the abdomen or pelvis. Reproductive: Status post hysterectomy.  Pessary. Other: Small volume ascites in the pelvis, slightly increased from 02/27/2022. Redemonstrated peritoneal nodules compatible with peritoneal carcinomatosis. For example on the left on series 2/image 45. Musculoskeletal: No acute fracture. Multiple superior endplate compression fractures in the lower thoracic and lumbar spine are unchanged from 02/27/2022. IMPRESSION: 1. Slight worsening of small bowel obstruction with transition points in the right lower quadrant. 2. Similar peritoneal carcinomatosis. 3. Moderate right pleural effusion with atelectasis/infiltrates in the right middle and lower lobes. Electronically Signed   By: TPlacido SouM.D.   On: 03/09/2022 02:40   DG Chest Port 1 View  Result Date: 03/09/2022 CLINICAL DATA:  Nausea and vomiting; follow-up effusion EXAM: PORTABLE CHEST 1 VIEW COMPARISON:  Radiographs 03/01/2022 FINDINGS: Stable cardiomediastinal silhouette at upper limits of normal for size. Accessed right chest wall Port-A-Cath with tip in the low SVC. Increased size of the now moderate right pleural effusion. Associated compressive atelectasis/consolidation. The left lung is clear. No acute osseous abnormality. IMPRESSION: Increased moderate right pleural effusion since 03/01/2022. Electronically Signed   By: TPlacido SouM.D.   On: 03/09/2022  00:22    Pending Labs Unresulted Labs (From admission, onward)     Start     Ordered   03/09/22 0250  Urine Culture  (Urine Culture)  Once,   URGENT       Question:  Indication  Answer:  Suprapubic pain   03/09/22 0250  Vitals/Pain Today's Vitals   03/09/22 0200 03/09/22 0230 03/09/22 0300 03/09/22 0310  BP: (!) 170/67 (!) 181/77 (!) 164/64   Pulse: 67 70 67   Resp: (!) '24 13 16   '$ Temp: 97.7 F (36.5 C)     TempSrc:      SpO2: 97% 97% 96%   Weight:      Height:      PainSc:    8     Isolation Precautions No active isolations  Medications Medications  HYDROmorphone (DILAUDID) injection 0.5 mg (0.5 mg Intravenous Given 03/09/22 0157)  cefTRIAXone (ROCEPHIN) 1 g in sodium chloride 0.9 % 100 mL IVPB (has no administration in time range)  sodium chloride 0.9 % bolus 1,000 mL (0 mLs Intravenous Stopped 03/09/22 0057)  ondansetron (ZOFRAN) injection 4 mg (4 mg Intravenous Given 03/08/22 2353)  HYDROmorphone (DILAUDID) injection 1 mg (1 mg Intravenous Given 03/08/22 2355)  iohexol (OMNIPAQUE) 300 MG/ML solution 100 mL (100 mLs Intravenous Contrast Given 03/09/22 0205)  sodium chloride (PF) 0.9 % injection (  Given by Other 03/09/22 0148)    Mobility non-ambulatory

## 2022-03-09 NOTE — H&P (Signed)
History and Physical    EVANEE LUBRANO JOI:[id number] DOB: Aug 21, 1940 DOA: 03/08/2022  PCP: Caryl Bis, MD   Patient coming from: Home   Chief Complaint: Abdominal pain, N/V   HPI: MILAYNA ROTENBERG is a pleasant 81 y.o. female with medical history significant for history of DVT and PE on Eliquis, hypertension, and ovarian cancer with peritoneal carcinomatosis and recurrent admissions for SBO related to her malignancy, now presenting to the emergency department with recurrent abdominal pain, nausea, and vomiting.  Patient was discharged from hospital 03/02/2022 and was doing fairly well back at home until yesterday evening when she developed recurrent abdominal pain and N/V.  Pain is primarily in the mid and upper abdomen, constant, and severe.  Symptoms are similar to her prior experiences with SBO but more severe.  She also reports worsening shortness of breath since the recent discharge.  She denies fevers or chills.  ED Course: Upon arrival to the ED, patient is found to be afebrile and saturating low 90s on room air with mild tachypnea and elevated blood pressure.  EKG features sinus rhythm and chest x-ray notable for increased moderate right pleural effusion.  CT of the abdomen and pelvis demonstrates slight worsening of SBO.  Patient was given a liter of normal saline, Dilaudid, Zofran, and Rocephin in the ED.  Review of Systems:  All other systems reviewed and apart from HPI, are negative.  Past Medical History:  Diagnosis Date   Back pain    DVT (deep venous thrombosis) ([place]) 02/02/2019   had DVT first that moved into lung   GERD (gastroesophageal reflux disease)    History of pulmonary embolus (PE) 02/02/2019   confirmed by CT chest- had DVT first that moved into lungs per patient   Hypertension    Ovarian cancer ([place])    Pessary maintenance    Stroke [facility])     Past Surgical History:  Procedure Laterality Date   ABDOMINAL HYSTERECTOMY     ovaries left   BACK SURGERY      DEBULKING Bilateral 06/06/2019   Procedure: RADICAL TUMOR DEBULKING;  Surgeon: Everitt Amber, MD;  Location: [facility];  Service: Gynecology;  Laterality: Bilateral;   FRACTURE SURGERY     IR IMAGING GUIDED PORT INSERTION  12/15/2018   OMENTECTOMY Bilateral 06/06/2019   Procedure: OMENTECTOMY;  Surgeon: Everitt Amber, MD;  Location: [facility];  Service: Gynecology;  Laterality: Bilateral;   SALPINGOOPHORECTOMY Bilateral 06/06/2019   Procedure: EXPLORATORY LAPAROTOMY,OPEN RIGHT SALPINGO OOPHORECTOMY;  Surgeon: Everitt Amber, MD;  Location: [facility];  Service: Gynecology;  Laterality: Bilateral;    Social History:   reports that she has never smoked. She has never been exposed to tobacco smoke. She has never used smokeless tobacco. She reports that she does not drink alcohol and does not use drugs.  No Known Allergies  Family History  Problem Relation Age of Onset   Stomach cancer Sister        dx 29s-50s   Dementia Mother    Cancer Sister        unk type, hysterectomy, dx 74s     Prior to Admission medications   Medication Sig Start Date End Date Taking? Authorizing Provider  acetaminophen (TYLENOL) 500 MG tablet Take 1,000 mg by mouth every 6 (six) hours as needed for mild pain or fever.    [provider]  anti-nausea (EMETROL) solution Take 10 mLs by mouth every 15 (fifteen) minutes as needed for nausea or vomiting.    [provider]  apixaban (ELIQUIS) 5 MG TABS tablet Take 1 tablet (5 mg total) by mouth 2 (two) times daily. 10/01/21   [name], MD  atenolol (TENORMIN) 25 MG tablet TAKE ONE (1) TABLET BY MOUTH EVERY DAY Patient taking differently: Take 25 mg by mouth daily. 01/26/22   [name], MD  bisacodyl (DULCOLAX) 5 MG EC tablet Take 1 tablet (5 mg total) by mouth daily as needed for moderate constipation. Patient taking differently: Take 5-10 mg by mouth daily. 01/30/22   Amin, Jeanella Flattery, MD  dexamethasone (DECADRON) 4 MG tablet Take 1 tablet (4 mg total) by mouth  daily. 01/31/22   Amin, Jeanella Flattery, MD  diclofenac Sodium (VOLTAREN) 1 % GEL Apply 1 application. topically daily as needed (arthritis pain).    [provider]  fentaNYL (DURAGESIC) 25 MCG/HR Place 1 patch onto the skin every 3 (three) days. 02/02/22   Damita Lack, MD  levothyroxine (SYNTHROID) 75 MCG tablet Take 1 tablet (75 mcg total) by mouth daily before breakfast. 07/18/20   Denton Brick, Courage, MD  lidocaine-prilocaine (EMLA) cream Apply 1 application topically daily as needed (port access). 07/22/21   [name], MD  LORazepam (ATIVAN) 0.5 MG tablet Take 0.5-1 mg by mouth every 4 (four) hours as needed for anxiety (for nausea). 02/18/22   [provider]  mometasone (NASONEX) 50 MCG/ACT nasal spray Place 2 sprays into the nose daily as needed (allergies).    [provider]  ondansetron (ZOFRAN-ODT) 4 MG disintegrating tablet Take 1 tablet (4 mg total) by mouth every 8 (eight) hours as needed for nausea or vomiting. 01/30/22   Amin, Ankit Chirag, MD  oxyCODONE (OXY IR/ROXICODONE) 5 MG immediate release tablet TAKE 1 TABLET BY MOUTH EVERY 4 HOURS AS NEEDED FOR SEVERE PAIN 01/20/22   [name], MD  polyethylene glycol (MIRALAX / GLYCOLAX) 17 g packet Take 17 g by mouth daily as needed for mild constipation. 01/30/22   Amin, Ankit Chirag, MD  senna-docusate (SENOKOT-S) 8.6-50 MG tablet Take 2 tablets by mouth 2 (two) times daily. 03/02/22   Bonnielee Haff, MD    Physical Exam: Vitals:   03/09/22 0145 03/09/22 0200 03/09/22 0230 03/09/22 0300  BP: (!) 196/71 (!) 170/67 (!) 181/77 (!) 164/64  Pulse: 68 67 70 67  Resp: 14 (!) '24 13 16  '$ Temp:  97.7 F (36.5 C)    TempSrc:      SpO2: 97% 97% 97% 96%  Weight:      Height:        Constitutional: NAD, calm  Eyes: PERTLA, lids and conjunctivae normal ENMT: Mucous membranes are moist. Posterior pharynx clear of any exudate or lesions.   Neck: supple, no masses  Respiratory: no wheezing, no crackles. No accessory  muscle use.  Cardiovascular: S1 & S2 heard, regular rate and rhythm. No extremity edema.  Abdomen: soft, tender without rebound pain or guarding. Bowel sounds hypoactive.  Musculoskeletal: no clubbing / cyanosis. No joint deformity upper and lower extremities.   Skin: no significant rashes, lesions, ulcers. Warm, dry, well-perfused. Neurologic: CN 2-12 grossly intact. Moving all extremities. Alert and oriented.  Psychiatric: Pleasant. Cooperative.    Labs and Imaging on Admission: I have personally reviewed following labs and imaging studies  CBC: Recent Labs  Lab 03/08/22 2326  WBC 7.7  NEUTROABS 5.6  HGB 12.8  HCT 38.9  MCV 104.3*  PLT 858*   Basic Metabolic Panel: Recent Labs  Lab 03/08/22 2326  NA 139  K 3.7  CL  104  CO2 26  GLUCOSE 128*  BUN 18  CREATININE 1.01*  CALCIUM 10.0   GFR: Estimated Creatinine Clearance: 54.4 mL/min (A) (by C-G formula based on SCr of 1.01 mg/dL (H)). Liver Function Tests: Recent Labs  Lab 03/08/22 2326  AST 22  ALT 22  ALKPHOS 63  BILITOT 0.5  PROT 6.8  ALBUMIN 3.4*   Recent Labs  Lab 03/08/22 2326  LIPASE 23   No results for input(s): "AMMONIA" in the last 168 hours. Coagulation Profile: No results for input(s): "INR", "PROTIME" in the last 168 hours. Cardiac Enzymes: No results for input(s): "CKTOTAL", "CKMB", "CKMBINDEX", "TROPONINI" in the last 168 hours. BNP (last 3 results) No results for input(s): "PROBNP" in the last 8760 hours. HbA1C: No results for input(s): "HGBA1C" in the last 72 hours. CBG: No results for input(s): "GLUCAP" in the last 168 hours. Lipid Profile: No results for input(s): "CHOL", "HDL", "LDLCALC", "TRIG", "CHOLHDL", "LDLDIRECT" in the last 72 hours. Thyroid Function Tests: No results for input(s): "TSH", "T4TOTAL", "FREET4", "T3FREE", "THYROIDAB" in the last 72 hours. Anemia Panel: No results for input(s): "VITAMINB12", "FOLATE", "FERRITIN", "TIBC", "IRON", "RETICCTPCT" in the last 72  hours. Urine analysis:    Component Value Date/Time   COLORURINE YELLOW 03/08/2022 2326   APPEARANCEUR CLEAR 03/08/2022 2326   LABSPEC 1.025 03/08/2022 2326   PHURINE 7.0 03/08/2022 2326   GLUCOSEU NEGATIVE 03/08/2022 2326   HGBUR LARGE (A) 03/08/2022 2326   BILIRUBINUR NEGATIVE 03/08/2022 2326   KETONESUR NEGATIVE 03/08/2022 2326   PROTEINUR 100 (A) 03/08/2022 2326   UROBILINOGEN 0.2 04/08/2013 2040   NITRITE POSITIVE (A) 03/08/2022 2326   LEUKOCYTESUR MODERATE (A) 03/08/2022 2326   Sepsis Labs: '@LABRCNTIP'$ (procalcitonin:4,lacticidven:4) )No results found for this or any previous visit (from the past 240 hour(s)).   Radiological Exams on Admission: CT ABDOMEN PELVIS W CONTRAST  Result Date: 03/09/2022 CLINICAL DATA:  Nausea and vomiting; bowel obstruction suspected EXAM: CT ABDOMEN AND PELVIS WITH CONTRAST TECHNIQUE: Multidetector CT imaging of the abdomen and pelvis was performed using the standard protocol following bolus administration of intravenous contrast. RADIATION DOSE REDUCTION: This exam was performed according to the departmental dose-optimization program which includes automated exposure control, adjustment of the mA and/or kV according to patient size and/or use of iterative reconstruction technique. CONTRAST:  163m OMNIPAQUE IOHEXOL 300 MG/ML  SOLN COMPARISON:  CT 02/27/2022 FINDINGS: Lower chest: Moderate right pleural effusion and associated atelectasis/consolidation. Right middle lobe and lower lobe infiltrates. Coronary artery calcification. Hepatobiliary: Hepatic steatosis. Redemonstrated small area of low attenuation in the inferior right hepatic lobe which measures 7 mm and is too small to definitively characterize. Peritoneal metastases along the anterior liver surface are redemonstrated. Gallbladder wall nodularity compatible with metastases. No biliary ductal dilation. Pancreas: Unremarkable. No pancreatic ductal dilatation or surrounding inflammatory changes.  Spleen: Normal in size without focal abnormality. Adrenals/Urinary Tract: Unremarkable adrenal glands. No urinary calculi or hydronephrosis. No focal renal lesion. Unremarkable bladder. Stomach/Bowel: Unremarkable stomach. Multiple loops of borderline dilated small bowel measuring up to 2.7 cm diameter are redemonstrated. This appears slightly increased from 02/27/2022. There are 2 adjacent transition points in the right lower abdomen (series 5/image 58 and series 5/image 67). Findings remain concerning for small bowel obstruction possibly secondary to peritoneal implants the areas of obstruction. Normal caliber of the colon. Vascular/Lymphatic: Aortic atherosclerosis. Unchanged enlargement of a 1.8 cm right inguinal lymph node. No lymphadenopathy in the abdomen or pelvis. Reproductive: Status post hysterectomy.  Pessary. Other: Small volume ascites in the pelvis, slightly increased  from 02/27/2022. Redemonstrated peritoneal nodules compatible with peritoneal carcinomatosis. For example on the left on series 2/image 45. Musculoskeletal: No acute fracture. Multiple superior endplate compression fractures in the lower thoracic and lumbar spine are unchanged from 02/27/2022. IMPRESSION: 1. Slight worsening of small bowel obstruction with transition points in the right lower quadrant. 2. Similar peritoneal carcinomatosis. 3. Moderate right pleural effusion with atelectasis/infiltrates in the right middle and lower lobes. Electronically Signed   By: Placido Sou M.D.   On: 03/09/2022 02:40   DG Chest Port 1 View  Result Date: 03/09/2022 CLINICAL DATA:  Nausea and vomiting; follow-up effusion EXAM: PORTABLE CHEST 1 VIEW COMPARISON:  Radiographs 03/01/2022 FINDINGS: Stable cardiomediastinal silhouette at upper limits of normal for size. Accessed right chest wall Port-A-Cath with tip in the low SVC. Increased size of the now moderate right pleural effusion. Associated compressive atelectasis/consolidation. The left  lung is clear. No acute osseous abnormality. IMPRESSION: Increased moderate right pleural effusion since 03/01/2022. Electronically Signed   By: Placido Sou M.D.   On: 03/09/2022 00:22    EKG: Independently reviewed. Sinus rhythm.   Assessment/Plan   1. SBO  - She has had recurrent partial SBO d/t her malignancy and presents with recurrent abdominal pain and N/V that she describes as more severe than prior episodes  - She was given IVF, analgesics, and antiemetics in ED  - She refuses NGT  - Continue conservative management with bowel rest, pain-control, antiemetics, and IVF    2. UTI  - Rocephin was started in ED for recurrent UTI  - Continue Rocephin, follow culture    3. Right pleural effusion  - Recurrent right pleural effusion noted in ED  - She has been increasingly SOB and requests another thoracentesis, will consult IR    4. Hx of DVT and PE  - Resume Eliquis when her condition allows    5. Ovarian cancer; chronic cancer-related pain  - Stopped treatment a couple months ago   - Continue fentanyl patch    DVT prophylaxis: SCDs  Code Status: DNR  Level of Care: Level of care: Med-Surg Family Communication: Daughter at bedside  Disposition Plan:  Patient is from: home  Anticipated d/c is to: TBD Anticipated d/c date is: 03/12/22  Patient currently: Pending pain-control, tolerance of oral intake, thoracentesis  Consults called: none  Admission status: Inpatient     Vianne Bulls, MD [facility]  03/09/2022, 3:47 AM

## 2022-03-09 NOTE — Care Plan (Signed)
This 81 years old female with PMH significant for DVT and PE on Eliquis, hypertension, ovarian cancer with peritoneal carcinomatosis and recurrent admissions for small bowel obstruction related to her malignancy now presented in the ED with abdominal pain nausea and vomiting.  Patient describes pain is similar to the prior experiences with SBO but is more severe.  CT abdomen and pelvis demonstrates slight worsening of small bowel obstruction.  Patient is admitted for further evaluation.  General surgery is consulted.  Patient is started on IV hydration, bowel rest, adequate pain control.  Patient is under home hospice care.  Patient is going to get Pleurx catheter for recurrent pleural effusion.  Patient was seen and examined at bedside,  daughter is at bedside all questions answered.

## 2022-03-09 NOTE — Progress Notes (Signed)
Patient arrived on the via stretcher with daughter at bedside. A&O. Complained of pain, N/V, PRN pain med and Zofran given. Paitent resting comfortably. We continue to monitor.

## 2022-03-10 ENCOUNTER — Inpatient Hospital Stay (HOSPITAL_COMMUNITY)

## 2022-03-10 ENCOUNTER — Encounter: Payer: Self-pay | Admitting: Hematology and Oncology

## 2022-03-10 DIAGNOSIS — K56609 Unspecified intestinal obstruction, unspecified as to partial versus complete obstruction: Secondary | ICD-10-CM

## 2022-03-10 HISTORY — PX: IR PERC PLEURAL DRAIN W/INDWELL CATH W/IMG GUIDE: IMG5383

## 2022-03-10 LAB — BASIC METABOLIC PANEL
Anion gap: 9 (ref 5–15)
BUN: 21 mg/dL (ref 8–23)
CO2: 26 mmol/L (ref 22–32)
Calcium: 9.1 mg/dL (ref 8.9–10.3)
Chloride: 101 mmol/L (ref 98–111)
Creatinine, Ser: 1.11 mg/dL — ABNORMAL HIGH (ref 0.44–1.00)
GFR, Estimated: 51 mL/min — ABNORMAL LOW (ref >60)
Glucose, Bld: 123 mg/dL — ABNORMAL HIGH (ref 70–99)
Potassium: 3.6 mmol/L (ref 3.5–5.1)
Sodium: 136 mmol/L (ref 135–145)

## 2022-03-10 LAB — PHOSPHORUS: Phosphorus: 3.4 mg/dL (ref 2.5–4.6)

## 2022-03-10 LAB — CBC
HCT: 34.1 % — ABNORMAL LOW (ref 36.0–46.0)
Hemoglobin: 11 g/dL — ABNORMAL LOW (ref 12.0–15.0)
MCH: 34.5 pg — ABNORMAL HIGH (ref 26.0–34.0)
MCHC: 32.3 g/dL (ref 30.0–36.0)
MCV: 106.9 fL — ABNORMAL HIGH (ref 80.0–100.0)
Platelets: 111 10*3/uL — ABNORMAL LOW (ref 150–400)
RBC: 3.19 MIL/uL — ABNORMAL LOW (ref 3.87–5.11)
RDW: 15.5 % (ref 11.5–15.5)
WBC: 5 10*3/uL (ref 4.0–10.5)
nRBC: 0.6 % — ABNORMAL HIGH (ref 0.0–0.2)

## 2022-03-10 LAB — MAGNESIUM: Magnesium: 1.4 mg/dL — ABNORMAL LOW (ref 1.7–2.4)

## 2022-03-10 MED ORDER — FENTANYL CITRATE (PF) 100 MCG/2ML IJ SOLN
INTRAMUSCULAR | Status: AC | PRN
  Administered 2022-03-10: 25 ug via INTRAVENOUS

## 2022-03-10 MED ORDER — LIDOCAINE-EPINEPHRINE 1 %-1:100000 IJ SOLN
INTRAMUSCULAR | Status: AC
  Filled 2022-03-10: qty 1

## 2022-03-10 MED ORDER — FENTANYL CITRATE (PF) 100 MCG/2ML IJ SOLN
INTRAMUSCULAR | Status: AC
  Filled 2022-03-10: qty 2

## 2022-03-10 MED ORDER — FENTANYL CITRATE (PF) 100 MCG/2ML IJ SOLN
INTRAMUSCULAR | Status: AC | PRN
  Administered 2022-03-10: 50 ug via INTRAVENOUS

## 2022-03-10 MED ORDER — MIDAZOLAM HCL 2 MG/2ML IJ SOLN
INTRAMUSCULAR | Status: AC | PRN
  Administered 2022-03-10: 1 mg via INTRAVENOUS

## 2022-03-10 MED ORDER — MORPHINE SULFATE (PF) 2 MG/ML IV SOLN
1.0000 mg | Freq: Once | INTRAVENOUS | Status: AC
  Administered 2022-03-10: 1 mg via INTRAVENOUS
  Filled 2022-03-10: qty 1

## 2022-03-10 MED ORDER — MIDAZOLAM HCL 2 MG/2ML IJ SOLN
INTRAMUSCULAR | Status: AC
  Filled 2022-03-10: qty 2

## 2022-03-10 MED ORDER — LIDOCAINE HCL 1 % IJ SOLN
INTRAMUSCULAR | Status: AC | PRN
  Administered 2022-03-10: 10 mL via INTRADERMAL

## 2022-03-10 MED ORDER — MAGNESIUM SULFATE 2 GM/50ML IV SOLN
2.0000 g | Freq: Once | INTRAVENOUS | Status: AC
  Administered 2022-03-10: 2 g via INTRAVENOUS
  Filled 2022-03-10: qty 50

## 2022-03-10 MED ORDER — APIXABAN 5 MG PO TABS
5.0000 mg | ORAL_TABLET | Freq: Two times a day (BID) | ORAL | Status: DC
  Administered 2022-03-11 – 2022-03-17 (×13): 5 mg via ORAL
  Filled 2022-03-10 (×15): qty 1

## 2022-03-10 NOTE — Procedures (Signed)
Vascular and Interventional Radiology Procedure Note  Patient: Paula Peterson DOB: Feb 17, 1941 Medical Record Number: [id number] Note Date/Time: 03/10/22 3:25 PM   Performing Physician: Michaelle Birks, MD Assistant(s): None  Diagnosis: Pleural effusion  Procedure:  RIGHT TUNNELED PLEURAL (PleurX) CATHETER PLACEMENT THERAPEUTIC THORACENTESIS  Anesthesia: Conscious Sedation Complications: None Estimated Blood Loss: Minimal Specimens:  Sent None  Findings:  The RIGHT chest was accessed with a Yueh Needle Tunneled pleural (PleurX) catheter was placed and 1000 mL of fluid was obtained.  See detailed procedure note with images in PACS. The patient tolerated the procedure well without incident or complication and was returned to Floor Bed in stable condition.    Michaelle Birks, MD [facility] Radiology   Pager. [place]

## 2022-03-10 NOTE — Progress Notes (Signed)
PROGRESS NOTE    Paula Peterson  JME:[id number] DOB: 1941-03-20 DOA: 03/08/2022 PCP: Caryl Bis, MD    Brief Narrative:  This 81 years old female with PMH significant for DVT and PE on Eliquis, hypertension, ovarian cancer with peritoneal carcinomatosis and recurrent admissions for small bowel obstruction related to her malignancy now presented in the ED with abdominal pain,  nausea and vomiting.  Patient describes pain is similar to the prior experiences with SBO but is more severe. CT abdomen and pelvis demonstrates slight worsening of small bowel obstruction.  Patient is admitted for further evaluation. General surgery is consulted.  Patient is started on IV hydration, bowel rest, adequate pain control.  Patient is under home hospice care. Patient is going to get Pleurx catheter for recurrent pleural effusion.  Assessment & Plan:   Principal Problem:   SBO (small bowel obstruction) (HCC) Active Problems:   Right ovarian epithelial cancer (HCC)   PE (pulmonary thromboembolism) (HCC)   Recurrent UTI   Thrombocytopenia (HCC)   Recurrent right pleural effusion   Acute cystitis with hematuria  Small bowel obstruction: Patient has had recurrent partial small bowel obstruction due to her malignancy and presented with recurrent abdominal pain, nausea, vomiting and describes it more severe than previous episodes. Continue IV hydration,  bowel rest, pain control and  antiemetics. Patient refuses NG tube. Continue conservative management and bowel rest. General surgery is consulted, awaiting recommendation.  UTI: Continue IV Rocephin for now.   Follow-up urine culture  Recurrent right pleural effusion: IR is consulted.  Patient is going to have Pleurx catheter for symptomatic relief.  History of DVT and PE Resume Eliquis.  Right ovarian cancer: Multiple prolonged discussion has happened between patient and the oncologist about goals of care.  She is now currently enrolled in home  based hospice program.  Her condition is noncurable, her prognosis is poor and with recurrent hospitalization is not possible that she can continue home hospice. Palliative care consulted to discuss goals of care.   DVT prophylaxis: SCDS Code Status: DNR Family Communication: Daughter at bed side. Disposition Plan:   Status is: Inpatient Remains inpatient appropriate because: Admitted for small bowel obstruction secondary to malignancy.  General surgery consulted patient needs conservative management, oncology is consulted palliative care consulted to discuss goals of care   Consultants:  General surgery Oncology Palliative care  Procedures: CT A/P  Antimicrobials:  Anti-infectives (From admission, onward)    Start     Dose/Rate Route Frequency Ordered Stop   03/10/22 0300  cefTRIAXone (ROCEPHIN) 1 g in sodium chloride 0.9 % 100 mL IVPB        1 g 200 mL/hr over 30 Minutes Intravenous Every 24 hours 03/09/22 0312     03/09/22 0300  cefTRIAXone (ROCEPHIN) 1 g in sodium chloride 0.9 % 100 mL IVPB        1 g 200 mL/hr over 30 Minutes Intravenous  Once 03/09/22 0250 03/09/22 0457       Subjective: Patient was seen and examined at bedside.  Overnight events noted.   Patient still reports having soreness in the mid abdomen,  denies any nausea and vomiting. She is scheduled to have Pleurx catheter for recurrent pleural effusion for symptomatic relief.  Objective: Vitals:   03/09/22 1645 03/09/22 2234 03/10/22 0607 03/10/22 0608  BP: (!) 119/58 127/67 117/62   Pulse: 75 93 93 87  Resp: '18 20 20   '$ Temp: 97.6 F (36.4 C) 97.8 F (36.6 C) 98.4 F (36.9  C)   TempSrc: Oral Oral Oral   SpO2: 92% 93% (!) 89% 95%  Weight:    89.5 kg  Height:       No intake or output data in the 24 hours ending 03/10/22 1153 Filed Weights   03/08/22 2219 03/10/22 0608  Weight: 95.3 kg 89.5 kg    Examination:  General exam: Appears comfortable, not in any acute distress,  deconditioned. Respiratory system: CTA bilaterally, No wheezing, No crackles, Normal respiratory effort. Cardiovascular system: S1 & S2 heard, regular rate and rhythm, no murmur. Gastrointestinal system: Abdomen is soft, nondistended, mildly tender, no bowel sounds Central nervous system: Alert and oriented X 3. No focal neurological deficits. Extremities: No edema, no cyanosis, no clubbing Skin: No rashes, lesions or ulcers Psychiatry: Judgement and insight appear normal. Mood & affect appropriate.     Data Reviewed: I have personally reviewed following labs and imaging studies  CBC: Recent Labs  Lab 03/08/22 2326 03/10/22 0445  WBC 7.7 5.0  NEUTROABS 5.6  --   HGB 12.8 11.0*  HCT 38.9 34.1*  MCV 104.3* 106.9*  PLT 138* 401*   Basic Metabolic Panel: Recent Labs  Lab 03/08/22 2326 03/10/22 0445  NA 139 136  K 3.7 3.6  CL 104 101  CO2 26 26  GLUCOSE 128* 123*  BUN 18 21  CREATININE 1.01* 1.11*  CALCIUM 10.0 9.1  MG  --  1.4*  PHOS  --  3.4   GFR: Estimated Creatinine Clearance: 48 mL/min (A) (by C-G formula based on SCr of 1.11 mg/dL (H)). Liver Function Tests: Recent Labs  Lab 03/08/22 2326  AST 22  ALT 22  ALKPHOS 63  BILITOT 0.5  PROT 6.8  ALBUMIN 3.4*   Recent Labs  Lab 03/08/22 2326  LIPASE 23   No results for input(s): "AMMONIA" in the last 168 hours. Coagulation Profile: Recent Labs  Lab 03/09/22 1315  INR 1.0   Cardiac Enzymes: No results for input(s): "CKTOTAL", "CKMB", "CKMBINDEX", "TROPONINI" in the last 168 hours. BNP (last 3 results) No results for input(s): "PROBNP" in the last 8760 hours. HbA1C: No results for input(s): "HGBA1C" in the last 72 hours. CBG: No results for input(s): "GLUCAP" in the last 168 hours. Lipid Profile: No results for input(s): "CHOL", "HDL", "LDLCALC", "TRIG", "CHOLHDL", "LDLDIRECT" in the last 72 hours. Thyroid Function Tests: No results for input(s): "TSH", "T4TOTAL", "FREET4", "T3FREE", "THYROIDAB"  in the last 72 hours. Anemia Panel: No results for input(s): "VITAMINB12", "FOLATE", "FERRITIN", "TIBC", "IRON", "RETICCTPCT" in the last 72 hours. Sepsis Labs: No results for input(s): "PROCALCITON", "LATICACIDVEN" in the last 168 hours.  No results found for this or any previous visit (from the past 240 hour(s)).   Radiology Studies: CT ABDOMEN PELVIS W CONTRAST  Result Date: 03/09/2022 CLINICAL DATA:  Nausea and vomiting; bowel obstruction suspected EXAM: CT ABDOMEN AND PELVIS WITH CONTRAST TECHNIQUE: Multidetector CT imaging of the abdomen and pelvis was performed using the standard protocol following bolus administration of intravenous contrast. RADIATION DOSE REDUCTION: This exam was performed according to the departmental dose-optimization program which includes automated exposure control, adjustment of the mA and/or kV according to patient size and/or use of iterative reconstruction technique. CONTRAST:  117m OMNIPAQUE IOHEXOL 300 MG/ML  SOLN COMPARISON:  CT 02/27/2022 FINDINGS: Lower chest: Moderate right pleural effusion and associated atelectasis/consolidation. Right middle lobe and lower lobe infiltrates. Coronary artery calcification. Hepatobiliary: Hepatic steatosis. Redemonstrated small area of low attenuation in the inferior right hepatic lobe which measures 7 mm and is  too small to definitively characterize. Peritoneal metastases along the anterior liver surface are redemonstrated. Gallbladder wall nodularity compatible with metastases. No biliary ductal dilation. Pancreas: Unremarkable. No pancreatic ductal dilatation or surrounding inflammatory changes. Spleen: Normal in size without focal abnormality. Adrenals/Urinary Tract: Unremarkable adrenal glands. No urinary calculi or hydronephrosis. No focal renal lesion. Unremarkable bladder. Stomach/Bowel: Unremarkable stomach. Multiple loops of borderline dilated small bowel measuring up to 2.7 cm diameter are redemonstrated. This appears  slightly increased from 02/27/2022. There are 2 adjacent transition points in the right lower abdomen (series 5/image 58 and series 5/image 67). Findings remain concerning for small bowel obstruction possibly secondary to peritoneal implants the areas of obstruction. Normal caliber of the colon. Vascular/Lymphatic: Aortic atherosclerosis. Unchanged enlargement of a 1.8 cm right inguinal lymph node. No lymphadenopathy in the abdomen or pelvis. Reproductive: Status post hysterectomy.  Pessary. Other: Small volume ascites in the pelvis, slightly increased from 02/27/2022. Redemonstrated peritoneal nodules compatible with peritoneal carcinomatosis. For example on the left on series 2/image 45. Musculoskeletal: No acute fracture. Multiple superior endplate compression fractures in the lower thoracic and lumbar spine are unchanged from 02/27/2022. IMPRESSION: 1. Slight worsening of small bowel obstruction with transition points in the right lower quadrant. 2. Similar peritoneal carcinomatosis. 3. Moderate right pleural effusion with atelectasis/infiltrates in the right middle and lower lobes. Electronically Signed   By: Placido Sou M.D.   On: 03/09/2022 02:40   DG Chest Port 1 View  Result Date: 03/09/2022 CLINICAL DATA:  Nausea and vomiting; follow-up effusion EXAM: PORTABLE CHEST 1 VIEW COMPARISON:  Radiographs 03/01/2022 FINDINGS: Stable cardiomediastinal silhouette at upper limits of normal for size. Accessed right chest wall Port-A-Cath with tip in the low SVC. Increased size of the now moderate right pleural effusion. Associated compressive atelectasis/consolidation. The left lung is clear. No acute osseous abnormality. IMPRESSION: Increased moderate right pleural effusion since 03/01/2022. Electronically Signed   By: Placido Sou M.D.   On: 03/09/2022 00:22    Scheduled Meds:  Chlorhexidine Gluconate Cloth  6 each Topical Daily   [START ON 03/11/2022] fentaNYL  1 patch Transdermal Q72H   Continuous  Infusions:  cefTRIAXone (ROCEPHIN)  IV 1 g (03/10/22 0303)   promethazine (PHENERGAN) injection (IM or IVPB)       LOS: 1 day    Time spent: 50 mins    Kandra Graven, MD [facility]   If 7PM-7AM, please contact night-coverage

## 2022-03-10 NOTE — Progress Notes (Signed)
Paula Peterson   DOB:[birth year]   EN#:[id number]    ASSESSMENT & PLAN:  Right ovarian epithelial cancer [facility]) She was last seen in the outpatient clinic 2 months ago and after prolonged discussions about goals of care, she is currently enrolled in home-based hospice program. She continues to have recurrent admission to the hospital for small bowel obstruction related to her disease Continue supportive care   Intermittent small bowel obstruction ([place]) She has intermittent small bowel obstruction secondary to adhesions as well as disease She has declined NG tube placement.  Continue conservative approach  Moderate pleural effusion Interventional radiologist is consulted for Pleurx catheter placement for symptomatic relief which I think is reasonable  Compression fracture of body of thoracic vertebra ([place]) She denies back pain today   Goals of care, counseling/discussion We had numerous goals of care discussions in the past Her condition is not curable Her prognosis is poor and with recurrent hospitalizations, it is not clear to me if she can continue home-based palliative care with hospice She will continue further discussion with family to see if it is appropriate to continue home hospice  Code Status DNR  Discharge planning Hopefully within the next 24 to 48 hours  All questions were answered. The patient knows to call the clinic with any problems, questions or concerns.   The total time spent in the appointment was 40 minutes encounter with patients including review of chart and various tests results, discussions about plan of care and coordination of care plan  [name], MD 03/10/2022 10:50 AM  Subjective:  I was informed of her current admission and plan for Pleurx catheter placement by interventional radiologist.  On review of her chart, the patient has recurrent admission over the past few months with recurrent small bowel obstruction.  She is currently on home hospice  program.  Family by the bedside.  The patient is comfortable and denies pain or nausea.  CT imaging was reviewed and goals of care is discussed  Objective:  Vitals:   03/10/22 0607 03/10/22 0608  BP: 117/62   Pulse: 93 87  Resp: 20   Temp: 98.4 F (36.9 C)   SpO2: (!) 89% 95%    No intake or output data in the 24 hours ending 03/10/22 1050  GENERAL:alert, no distress and comfortable  NEURO: alert & oriented x 3 with fluent speech, no focal motor/sensory deficits   Labs:  Recent Labs    02/27/22 0415 02/28/22 0308 03/08/22 2326 03/10/22 0445  NA 137 136 139 136  K 3.5 4.2 3.7 3.6  CL 99 105 104 101  CO2 '25 24 26 26  '$ GLUCOSE 147* 115* 128* 123*  BUN 26* 25* 18 21  CREATININE 1.04* 0.89 1.01* 1.11*  CALCIUM 9.9 8.7* 10.0 9.1  GFRNONAA 55* >60 57* 51*  PROT 6.5 5.5* 6.8  --   ALBUMIN 3.4* 2.8* 3.4*  --   AST '18 16 22  '$ --   ALT '18 17 22  '$ --   ALKPHOS 72 68 63  --   BILITOT 0.8 0.7 0.5  --     Studies: I have personally reviewed her CT imaging

## 2022-03-10 NOTE — Progress Notes (Addendum)
[facility] Progress Note     Subjective: CC:  Paula Peterson is known to our practice due to recent history of malignant SBO. She was readmitted 9/10 with nausea, vomiting, abdominal pain and CT showing SBO. She also has a right pleural effusion. She tells me her last BM was Sunday morning and she developed abdominal pain, nausea, and vomiting later that day. She also reports vaginal bleeing that has occurred off and on for a couple years now.   She is currently on home hospice for her metastatic R ovarian CA. We discussed her pSBO and she tells me she does not want an NG tube or surgery for this problem. She is not having flatus/BMs. No vomiting in 24 hours.  Her daughter is at the bedside, patient tells me she relies heavily on her family to help her with her medical decision making and comprehension.   Objective: Vital signs in last 24 hours: Temp:  [97.6 F (36.4 C)-98.4 F (36.9 C)] 98.4 F (36.9 C) (09/12 0607) Pulse Rate:  [75-93] 87 (09/12 0608) Resp:  [18-20] 20 (09/12 0607) BP: (117-127)/(58-67) 117/62 (09/12 0607) SpO2:  [89 %-95 %] 95 % (09/12 0608) Weight:  [89.5 kg] 89.5 kg (09/12 0608)    Intake/Output from previous day: No intake/output data recorded. Intake/Output this shift: No intake/output data recorded.  PE: Gen:  Alert, ill appearing female, pleasant and cooperative Card:  Regular rate and rhythm, pedal pulses 2+ BL Pulm:  Normal effort on room air, port in R chest wall Abd: Soft, mild distention, mild tenderness, worse in lower abdomen, previous laparotomy scar appears well healing. No hernias or masses.  Skin: warm and dry, no rashes  Psych: A&Ox3   Lab Results:  Recent Labs    03/08/22 2326 03/10/22 0445  WBC 7.7 5.0  HGB 12.8 11.0*  HCT 38.9 34.1*  PLT 138* 111*   BMET Recent Labs    03/08/22 2326 03/10/22 0445  NA 139 136  K 3.7 3.6  CL 104 101  CO2 26 26  GLUCOSE 128* 123*  BUN 18 21  CREATININE 1.01* 1.11*  CALCIUM 10.0  9.1   PT/INR Recent Labs    03/09/22 1315  LABPROT 13.2  INR 1.0   CMP     Component Value Date/Time   NA 136 03/10/2022 0445   K 3.6 03/10/2022 0445   CL 101 03/10/2022 0445   CO2 26 03/10/2022 0445   GLUCOSE 123 (H) 03/10/2022 0445   BUN 21 03/10/2022 0445   CREATININE 1.11 (H) 03/10/2022 0445   CREATININE 1.14 (H) 12/08/2021 1038   CALCIUM 9.1 03/10/2022 0445   PROT 6.8 03/08/2022 2326   ALBUMIN 3.4 (L) 03/08/2022 2326   AST 22 03/08/2022 2326   AST 18 12/08/2021 1038   ALT 22 03/08/2022 2326   ALT 23 12/08/2021 1038   ALKPHOS 63 03/08/2022 2326   BILITOT 0.5 03/08/2022 2326   BILITOT 0.3 12/08/2021 1038   GFRNONAA 51 (L) 03/10/2022 0445   GFRNONAA 49 (L) 12/08/2021 1038   GFRAA >60 03/25/2020 1144   Lipase     Component Value Date/Time   LIPASE 23 03/08/2022 2326       Studies/Results: CT ABDOMEN PELVIS W CONTRAST  Result Date: 03/09/2022 CLINICAL DATA:  Nausea and vomiting; bowel obstruction suspected EXAM: CT ABDOMEN AND PELVIS WITH CONTRAST TECHNIQUE: Multidetector CT imaging of the abdomen and pelvis was performed using the standard protocol following bolus administration of intravenous contrast. RADIATION DOSE REDUCTION: This exam was  performed according to the departmental dose-optimization program which includes automated exposure control, adjustment of the mA and/or kV according to patient size and/or use of iterative reconstruction technique. CONTRAST:  144m OMNIPAQUE IOHEXOL 300 MG/ML  SOLN COMPARISON:  CT 02/27/2022 FINDINGS: Lower chest: Moderate right pleural effusion and associated atelectasis/consolidation. Right middle lobe and lower lobe infiltrates. Coronary artery calcification. Hepatobiliary: Hepatic steatosis. Redemonstrated small area of low attenuation in the inferior right hepatic lobe which measures 7 mm and is too small to definitively characterize. Peritoneal metastases along the anterior liver surface are redemonstrated. Gallbladder wall  nodularity compatible with metastases. No biliary ductal dilation. Pancreas: Unremarkable. No pancreatic ductal dilatation or surrounding inflammatory changes. Spleen: Normal in size without focal abnormality. Adrenals/Urinary Tract: Unremarkable adrenal glands. No urinary calculi or hydronephrosis. No focal renal lesion. Unremarkable bladder. Stomach/Bowel: Unremarkable stomach. Multiple loops of borderline dilated small bowel measuring up to 2.7 cm diameter are redemonstrated. This appears slightly increased from 02/27/2022. There are 2 adjacent transition points in the right lower abdomen (series 5/image 58 and series 5/image 67). Findings remain concerning for small bowel obstruction possibly secondary to peritoneal implants the areas of obstruction. Normal caliber of the colon. Vascular/Lymphatic: Aortic atherosclerosis. Unchanged enlargement of a 1.8 cm right inguinal lymph node. No lymphadenopathy in the abdomen or pelvis. Reproductive: Status post hysterectomy.  Pessary. Other: Small volume ascites in the pelvis, slightly increased from 02/27/2022. Redemonstrated peritoneal nodules compatible with peritoneal carcinomatosis. For example on the left on series 2/image 45. Musculoskeletal: No acute fracture. Multiple superior endplate compression fractures in the lower thoracic and lumbar spine are unchanged from 02/27/2022. IMPRESSION: 1. Slight worsening of small bowel obstruction with transition points in the right lower quadrant. 2. Similar peritoneal carcinomatosis. 3. Moderate right pleural effusion with atelectasis/infiltrates in the right middle and lower lobes. Electronically Signed   By: TPlacido SouM.D.   On: 03/09/2022 02:40   DG Chest Port 1 View  Result Date: 03/09/2022 CLINICAL DATA:  Nausea and vomiting; follow-up effusion EXAM: PORTABLE CHEST 1 VIEW COMPARISON:  Radiographs 03/01/2022 FINDINGS: Stable cardiomediastinal silhouette at upper limits of normal for size. Accessed right chest  wall Port-A-Cath with tip in the low SVC. Increased size of the now moderate right pleural effusion. Associated compressive atelectasis/consolidation. The left lung is clear. No acute osseous abnormality. IMPRESSION: Increased moderate right pleural effusion since 03/01/2022. Electronically Signed   By: TPlacido SouM.D.   On: 03/09/2022 00:22    Anti-infectives: Anti-infectives (From admission, onward)    Start     Dose/Rate Route Frequency Ordered Stop   03/10/22 0300  cefTRIAXone (ROCEPHIN) 1 g in sodium chloride 0.9 % 100 mL IVPB        1 g 200 mL/hr over 30 Minutes Intravenous Every 24 hours 03/09/22 0312     03/09/22 0300  cefTRIAXone (ROCEPHIN) 1 g in sodium chloride 0.9 % 100 mL IVPB        1 g 200 mL/hr over 30 Minutes Intravenous  Once 03/09/22 0250 03/09/22 0457        Assessment/Plan Ovarian cancer with peritoneal carcinomatosis Malignant pSBO, acute on chronic  - afebrile, vitals stable, no indication for emergent abdominal surgery - clinically obstructed with no flatus/BM in 2-3 days.  - patient is very clear that she does not want an NG tube and she does not want surgery. Patient expresses understanding that she is at risk for recurrent hospital admissions related to small bowl obstruction without a way to decompress her stomach. The patient  is going for a pleural drainage catheter of her right pleural effusion today, I will ask IR to review her as a potential candidate for a percutaneous venting gastrostomy tube. If the patients goal is to continue home hospice with her loved ones, a venting gastrostomy tube may help alleviate her obstructive symptoms and keep her from recurrent hospital admission.  - continue antiemetics, analgesics, IVF/supportive care  - general surgery will sign off, please call back as needed.   LOS: 1 day   I reviewed nursing notes, Consultant IR, oncology notes, hospitalist notes, last 24 h vitals and pain scores, last 48 h intake and output, last  24 h labs and trends, and last 24 h imaging results.    Obie Dredge, PA-C [facility] Please see Amion for pager number during day hours 7:00am-4:30pm

## 2022-03-10 NOTE — Progress Notes (Signed)
Patient ID: Paula Peterson, female   DOB: January 02, 1941, 81 y.o.   MRN: [id number] Request received for possible venting G tube placement in pt; imaging studies were reviewed by Dr. Maryelizabeth Kaufmann and case d/w CCS. Pt has intact pylorus and partial SBO, therefore no role for venting G tube in this setting.

## 2022-03-11 ENCOUNTER — Inpatient Hospital Stay (HOSPITAL_COMMUNITY)

## 2022-03-11 DIAGNOSIS — C561 Malignant neoplasm of right ovary: Secondary | ICD-10-CM | POA: Diagnosis not present

## 2022-03-11 DIAGNOSIS — Z515 Encounter for palliative care: Secondary | ICD-10-CM | POA: Diagnosis not present

## 2022-03-11 DIAGNOSIS — Z7189 Other specified counseling: Secondary | ICD-10-CM | POA: Diagnosis not present

## 2022-03-11 DIAGNOSIS — K5669 Other partial intestinal obstruction: Secondary | ICD-10-CM

## 2022-03-11 DIAGNOSIS — K56609 Unspecified intestinal obstruction, unspecified as to partial versus complete obstruction: Secondary | ICD-10-CM | POA: Diagnosis not present

## 2022-03-11 LAB — BASIC METABOLIC PANEL
Anion gap: 8 (ref 5–15)
BUN: 17 mg/dL (ref 8–23)
CO2: 27 mmol/L (ref 22–32)
Calcium: 8.8 mg/dL — ABNORMAL LOW (ref 8.9–10.3)
Chloride: 102 mmol/L (ref 98–111)
Creatinine, Ser: 0.88 mg/dL (ref 0.44–1.00)
GFR, Estimated: >60 mL/min (ref >60)
Glucose, Bld: 104 mg/dL — ABNORMAL HIGH (ref 70–99)
Potassium: 3.2 mmol/L — ABNORMAL LOW (ref 3.5–5.1)
Sodium: 137 mmol/L (ref 135–145)

## 2022-03-11 LAB — URINE CULTURE: Culture: >=100000 — AB

## 2022-03-11 LAB — CBC
HCT: 32.2 % — ABNORMAL LOW (ref 36.0–46.0)
Hemoglobin: 10.3 g/dL — ABNORMAL LOW (ref 12.0–15.0)
MCH: 34.1 pg — ABNORMAL HIGH (ref 26.0–34.0)
MCHC: 32 g/dL (ref 30.0–36.0)
MCV: 106.6 fL — ABNORMAL HIGH (ref 80.0–100.0)
Platelets: 103 10*3/uL — ABNORMAL LOW (ref 150–400)
RBC: 3.02 MIL/uL — ABNORMAL LOW (ref 3.87–5.11)
RDW: 15.2 % (ref 11.5–15.5)
WBC: 3.8 10*3/uL — ABNORMAL LOW (ref 4.0–10.5)
nRBC: 0.5 % — ABNORMAL HIGH (ref 0.0–0.2)

## 2022-03-11 LAB — MAGNESIUM: Magnesium: 1.7 mg/dL (ref 1.7–2.4)

## 2022-03-11 MED ORDER — POTASSIUM CHLORIDE 20 MEQ PO PACK
40.0000 meq | PACK | Freq: Once | ORAL | Status: AC
  Administered 2022-03-11: 40 meq via ORAL
  Filled 2022-03-11: qty 2

## 2022-03-11 MED ORDER — FENTANYL 25 MCG/HR TD PT72
1.0000 | MEDICATED_PATCH | TRANSDERMAL | Status: DC
  Administered 2022-03-11: 1 via TRANSDERMAL
  Filled 2022-03-11: qty 1

## 2022-03-11 NOTE — Consult Note (Signed)
Consultation Note Date: 03/11/2022   Patient Name: Paula Peterson  DOB: 1940-10-03  MRN: [id number]  Age / Sex: 81 y.o., female  PCP: Caryl Bis, MD Referring Physician: Shawna Clamp, MD  Reason for Consultation: Establishing goals of care  HPI/Patient Profile: 81 y.o. female  with past medical history of ovarian cancer with peritoneal carcinomatosis with recurrent admission for SBO, DVT and PE on Eliquis, hypertension, stroke, GERD admitted on 03/08/2022 with abdominal pain with nausea/vomiting related to partial bowel obstruction, UTI, and recurrent right pleural effusion s/p PleurX drain 9/12.   Clinical Assessment and Goals of Care: I met today with Paula Peterson and Paula Peterson after reviewing records. Paula Peterson is a very kind and pleasant woman who has had a difficult night with pain mostly related to PleurX drain placement that she reports is easing off some today. She is tired. She denies much pain in abd although last BM and last flatus noted to be Sunday 9/10. She has assistance at home from Paula Peterson and was even able to get out of the house and had a good day just Saturday prior to her admission. I discussed with them both about plans and options moving forward. We discussed potential of venting PEG placement and the goal of managing symptoms but this does not alter poor prognosis. We discussed risks vs benefits of steroid use as family reports this makes her hungry and she wants to eat foods but this isn't helpful for her abd and causes more nausea/vomiting. We discussed her goal of returning back home with hospice and discussed the option of transition to hospice facility at some point in the future. She does not feel like she is ready for hospice facility yet. She is not sure that she would want venting PEG if indicated. I do worry with no BM or flatus that she could be progressing to complete  bowel obstruction as her abd is tight and distended - family confirm this seems more tight today. Reassured that she has not had much abd pain or nausea/vomiting. Discussed the importance of ongoing discussion and planning for her wishes moving forward to know if she would desire further intervention to manage her symptoms (such as venting PEG) vs medication management for symptoms (best done at hospice facility). They would like to continue this discussion tomorrow morning with her Paula, Paula Peterson, present as well. Struggling with decisions to know how much she is willing to go through with no guarantees that interventions will provide her with acceptable quality of life but she also is hopeful for more time at home with her family. I will meet with them again tomorrow 9/14 0900.   All questions/concerns addressed. Emotional support provided.   Primary Decision Maker PATIENT with assistance from daughters Paula Peterson and Paula Peterson   - DNR - Hopeful for return home with hospice support - Considering her desire for venting PEG and ongoing steroids  Code Status/Advance Care Planning: DNR   Symptom Management:  No changes to  symptom management at this time. Educated them that we can increase dosage or frequency if not having adequate relief.   Palliative Prophylaxis:  Bowel Regimen, Frequent Pain Assessment, and Turn Reposition  Psycho-social/Spiritual:  Desire for further Chaplaincy support:yes Additional Recommendations: Grief/Bereavement Support  Prognosis:  Overall prognosis poor. Dependent on progression of malignant bowel obstruction.   Discharge Planning: To Be Determined      Primary Diagnoses: Present on Admission:  SBO (small bowel obstruction) (HCC)  Thrombocytopenia (HCC)  Right ovarian epithelial cancer ([place])  Recurrent UTI  PE (pulmonary thromboembolism) ([place])   I have reviewed the medical record, interviewed the patient and family, and  examined the patient. The following aspects are pertinent.  Past Medical History:  Diagnosis Date   Back pain    DVT (deep venous thrombosis) ([place]) 02/02/2019   had DVT first that moved into lung   GERD (gastroesophageal reflux disease)    History of pulmonary embolus (PE) 02/02/2019   confirmed by CT chest- had DVT first that moved into lungs per patient   Hypertension    Ovarian cancer ([place])    Pessary maintenance    Stroke [facility])    Social History   Socioeconomic History   Marital status: Widowed    Spouse name: Edith Groleau   Number of children: 3   Years of education: 12   Highest education level: 12th grade  Occupational History   Occupation: retired Oncologist  Tobacco Use   Smoking status: Never    Passive exposure: Never   Smokeless tobacco: Never  Vaping Use   Vaping Use: Never used  Substance and Sexual Activity   Alcohol use: No   Drug use: No   Sexual activity: Not Currently  Other Topics Concern   Not on file  Social History Narrative   Not on file   Social Determinants of Health   Financial Resource Strain: Low Risk  (02/17/2022)   Overall Financial Resource Strain (CARDIA)    Difficulty of Paying Living Expenses: Not hard at all  Food Insecurity: No Food Insecurity (02/17/2022)   Hunger Vital Sign    Worried About Running Out of Food in the Last Year: Never true    [place] in the Last Year: Never true  Transportation Needs: No Transportation Needs (02/17/2022)   PRAPARE - Hydrologist (Medical): No    Lack of Transportation (Non-Medical): No  Physical Activity: Inactive (02/17/2022)   Exercise Vital Sign    Days of Exercise per Week: 0 days    Minutes of Exercise per Session: 0 min  Stress: No Stress Concern Present (02/17/2022)   [place]    Feeling of Stress : Only a little  Social Connections: Moderately Integrated (02/17/2022)   Social  Connection and Isolation Panel [NHANES]    Frequency of Communication with Friends and Family: More than three times a week    Frequency of Social Gatherings with Friends and Family: More than three times a week    Attends Religious Services: More than 4 times per year    Active Member of [employer] or Organizations: Yes    Attends Archivist Meetings: More than 4 times per year    Marital Status: Widowed   Family History  Problem Relation Age of Onset   Stomach cancer Sister        dx 52s-50s   Dementia Mother    Cancer Sister  unk type, hysterectomy, dx 40s   Scheduled Meds:  apixaban  5 mg Oral BID   Chlorhexidine Gluconate Cloth  6 each Topical Daily   fentaNYL  1 patch Transdermal Q72H   potassium chloride  40 mEq Oral Once   Continuous Infusions:  cefTRIAXone (ROCEPHIN)  IV 1 g (03/11/22 0331)   promethazine (PHENERGAN) injection (IM or IVPB)     PRN Meds:.HYDROmorphone (DILAUDID) injection, HYDROmorphone (DILAUDID) injection, labetalol, ondansetron **OR** ondansetron (ZOFRAN) IV, promethazine (PHENERGAN) injection (IM or IVPB), sodium chloride flush No Known Allergies Review of Systems  Constitutional:  Positive for activity change and appetite change.  Respiratory:  Negative for shortness of breath.   Gastrointestinal:  Positive for abdominal distention and constipation. Negative for nausea.    Physical Exam Vitals and nursing note reviewed.  Constitutional:      Appearance: She is ill-appearing.  Cardiovascular:     Rate and Rhythm: Normal rate.  Pulmonary:     Effort: No tachypnea, accessory muscle usage or respiratory distress.  Abdominal:     General: There is distension.     Comments: LBM and flatus 9/10  Neurological:     Mental Status: She is alert and oriented to person, place, and time.     Vital Signs: BP 134/67 (BP Location: Left Arm)   Pulse 97 Comment: Tech first got 32. Recheck with machine and radial  Temp 97.8 F (36.6 C) (Oral)    Resp 20   Ht 5' 7"  (1.702 m)   Wt 94.1 kg   SpO2 92%   BMI 32.49 kg/m  Pain Scale: 0-10 POSS *See Group Information*: S-Acceptable,Sleep, easy to arouse Pain Score: Asleep   SpO2: SpO2: 92 % O2 Device:SpO2: 92 % O2 Flow Rate: .O2 Flow Rate (L/min): 3 L/min  IO: Intake/output summary:  Intake/Output Summary (Last 24 hours) at 03/11/2022 0848 Last data filed at 03/11/2022 0600 Gross per 24 hour  Intake 200 ml  Output 200 ml  Net 0 ml    LBM: Last BM Date : 02/26/22 Baseline Weight: Weight: 95.3 kg Most recent weight: Weight: 94.1 kg     Palliative Assessment/Data:     Time In: 0915  Time Total: 75 min   Greater than 50%  of this time was spent counseling and coordinating care related to the above assessment and plan.  Signed by: Vinie Sill, NP Palliative Medicine Team Pager # [phone] (M-F 8a-5p) Team Phone # [phone] (Nights/Weekends)

## 2022-03-11 NOTE — Progress Notes (Addendum)
Referring Physician(s): Opyd,T/Gorsuch,N  Supervising Physician: Mir, Biochemist, clinical  Patient Status:  [facility] - In-pt  Chief Complaint: Metastatic ovarian cancer, abdominal pain, dyspnea, recurrent right pleural effusion, right lateral chest discomfort   Subjective: Pt c/o rt lateral chest discomfort, primarily at pleurx insertion site, sl worse with any movement   Allergies: Patient has no known allergies.  Medications: Prior to Admission medications   Medication Sig Start Date End Date Taking? Authorizing Provider  acetaminophen (TYLENOL) 500 MG tablet Take 1,000 mg by mouth every 4 (four) hours as needed for headache.   Yes [provider]  apixaban (ELIQUIS) 5 MG TABS tablet Take 1 tablet (5 mg total) by mouth 2 (two) times daily. 10/01/21  Yes Gorsuch, Ni, MD  atenolol (TENORMIN) 25 MG tablet TAKE ONE (1) TABLET BY MOUTH EVERY DAY Patient taking differently: Take 25 mg by mouth daily. 01/26/22  Yes Gorsuch, Ni, MD  bisacodyl (DULCOLAX) 5 MG EC tablet Take 1 tablet (5 mg total) by mouth daily as needed for moderate constipation. Patient taking differently: Take 10 mg by mouth daily. 01/30/22  Yes Amin, Ankit Chirag, MD  dexamethasone (DECADRON) 4 MG tablet Take 1 tablet (4 mg total) by mouth daily. 01/31/22  Yes Amin, Ankit Chirag, MD  diclofenac Sodium (VOLTAREN) 1 % GEL Apply 1 application. topically daily as needed (arthritis pain).   Yes [provider]  fentaNYL (DURAGESIC) 25 MCG/HR Place 1 patch onto the skin every 3 (three) days. 02/02/22  Yes Amin, Jeanella Flattery, MD  levothyroxine (SYNTHROID) 75 MCG tablet Take 1 tablet (75 mcg total) by mouth daily before breakfast. 07/18/20  Yes Emokpae, Courage, MD  LORazepam (ATIVAN) 0.5 MG tablet Take 0.5-1 mg by mouth daily as needed for anxiety (for nausea). 02/18/22  Yes [provider]  ondansetron (ZOFRAN-ODT) 4 MG disintegrating tablet Take 1 tablet (4 mg total) by mouth every 8 (eight) hours as needed for nausea or  vomiting. Patient taking differently: Take 4-8 mg by mouth daily as needed for nausea or vomiting. 01/30/22  Yes Amin, Ankit Chirag, MD  oxyCODONE (OXY IR/ROXICODONE) 5 MG immediate release tablet TAKE 1 TABLET BY MOUTH EVERY 4 HOURS AS NEEDED FOR SEVERE PAIN 01/20/22  Yes Gorsuch, Ni, MD  senna-docusate (SENOKOT-S) 8.6-50 MG tablet Take 2 tablets by mouth 2 (two) times daily. Patient taking differently: Take 1 tablet by mouth daily as needed for mild constipation. 03/02/22  Yes Bonnielee Haff, MD  lidocaine-prilocaine (EMLA) cream Apply 1 application topically daily as needed (port access). Patient not taking: Reported on 03/09/2022 07/22/21   [name], MD  Oxycodone HCl 10 MG TABS Take by mouth. Patient not taking: Reported on 03/09/2022    [provider]     Vital Signs: BP 135/60 (BP Location: Left Arm)   Pulse 94   Temp 98.5 F (36.9 C) (Oral)   Resp 14   Ht '5\' 7"'$  (1.702 m)   Wt 207 lb 7.3 oz (94.1 kg)   SpO2 90%   BMI 32.49 kg/m   Physical Exam awake, sl drowsy; rt pleurx cath intact, insertion site ok, no leaking, mod tender to palpation  Imaging: DG Chest Port 1 View  Result Date: 03/11/2022 CLINICAL DATA:  Right pneumothorax. EXAM: PORTABLE CHEST 1 VIEW COMPARISON:  Chest x-ray 03/10/2022. FINDINGS: Mild increase in the small to moderate right apical pneumothorax. Right chest tube in place. Mild increase in small right pleural effusion. Similar position of a right IJ approach central venous. Bibasilar opacities. Cardiomediastinal silhouette is  within normal limits. IMPRESSION: 1. Mild increase in the small to moderate right apical pneumothorax. Right chest tube in place. 2. Mild increase in small right pleural effusion. 3. Bibasilar atelectasis and/or consolidation. These results will be called to the ordering clinician or representative by the Radiologist Assistant, and communication documented in the PACS or [employer]. Electronically Signed   By: Margaretha Sheffield  M.D.   On: 03/11/2022 08:12   IR PERC PLEURAL DRAIN W/INDWELL CATH W/IMG GUIDE  Result Date: 03/10/2022 CLINICAL DATA:  Recurrent pleural effusion. Briefly, 81 year old female with stage IV ovarian cancer with recurrent effusion. Last thoracentesis 03/01/2022. EXAM: Procedures: 1. INSERTION OF RIGHT CHEST TUNNELED PLEURAL DRAINAGE (PLEURX) CATHETER 2. THERAPEUTIC THORACENTESIS COMPARISON:  Chest XR, 03/09/2022. IR ultrasound, 03/01/2022. CT chest, 01/28/2022. MEDICATIONS: The patient was on scheduled IV antibiotics. ANESTHESIA/SEDATION: Moderate (conscious) sedation was employed during this procedure. A total of Versed 2 mg and Fentanyl 200 mcg was administered intravenously. Moderate Sedation Time: 26 minutes. The patient's level of consciousness and vital signs were monitored continuously by radiology nursing throughout the procedure under my direct supervision. FLUOROSCOPY: Fluoroscopic dose; 0 mGy COMPLICATIONS: None immediate. PROCEDURE: The procedure, risks, benefits, and alternatives were explained to the the patient and/or patient's representative, who wishes to proceed with the placement of this permanent pleural catheter as they are seeking palliative care. The the patient and/or patient's representative understands and consents to the procedure. The RIGHT lateral chest and upper abdomen were prepped and draped in a sterile fashion, and a sterile drape was applied covering the operative field. A sterile gown and sterile gloves were used for the procedure. Initial ultrasound scanning and fluoroscopic imaging demonstrates a recurrent moderate to large pleural effusion. Under direct ultrasound guidance, the inferior lateral pleural space was accessed with a Yueh sheath needle after the overlying soft tissues were anesthetized with 1% lidocaine with epinephrine. An Amplatz super stiff wire was then advanced under fluoroscopy into the pleural space. A 15.5 Fr tunneled Pleur-X catheter was tunneled from an  incision within the right upper abdominal quadrant to the access site. The pleural access site was serially dilated under fluoroscopy, ultimately allowing placement of a peel-away sheath. The catheter was advanced through the peel-away sheath. The sheath was then removed. Final catheter positioning was confirmed with a fluoroscopic radiographic image. The access incision was closed with an interrupted subcutaneous subcuticular 3-0 Vicryl then Dermabond was applied at the skin. A 2-0 Ethilon retention suture was applied at the catheter exit site. Large volume thoracentesis was performed through the new catheter utilizing provided bulb vacuum assisted drainage bag. Dressings were applied. The patient tolerated the above procedure well without immediate postprocedural complication. FINDINGS: Preprocedural ultrasound scanning demonstrates a recurrent moderate sized RIGHT sided pleural effusion. After ultrasound and fluoroscopic guided placement, the catheter is directed medial basilar Following catheter placement, approximately 1000 mL of serous pleural fluid was removed. IMPRESSION: 1. Successful placement of cuffed RIGHT tunneled pleural drainage (PleurX) catheter, as above. 2. Therapeutic thoracentesis was performed, with 1000 mL of serous pleural fluid removed following catheter placement. Michaelle Birks, MD [facility] Radiology Electronically Signed   By: Michaelle Birks M.D.   On: 03/10/2022 21:21   DG Chest Port 1 View  Result Date: 03/10/2022 CLINICAL DATA:  Postop.  RIGHT pleural catheter placement EXAM: PORTABLE CHEST 1 VIEW COMPARISON:  Chest XR, 03/09/2022. IR fluoroscopy, earlier same day. CT chest, 01/28/2022. FINDINGS: Support lines: RIGHT IJ chest port, with catheter tip at the superior cavoatrial junction,  unchanged. New RIGHT basilar pleural drainage catheter, with tip projecting apical along the mid chest. Cardiomediastinal silhouette is unchanged and  within normal limits. The LEFT lung remains well inflated. Improved inflation of the RIGHT lung, with interval decrease in pleural effusion, previously moderate now small volume of residual trace RIGHT apical pneumothorax. No interval osseous abnormality IMPRESSION: 1. Trace RIGHT apical pneumothorax 2. New RIGHT basilar pleural drainage catheter with decreased pleural effusion, now small volume of residual. These results were conveyed at the time of interpretation on 03/10/2022 at 3:31 pm to provider [facility] , who verbally acknowledged these results. Electronically Signed   By: Michaelle Birks M.D.   On: 03/10/2022 15:36   CT ABDOMEN PELVIS W CONTRAST  Result Date: 03/09/2022 CLINICAL DATA:  Nausea and vomiting; bowel obstruction suspected EXAM: CT ABDOMEN AND PELVIS WITH CONTRAST TECHNIQUE: Multidetector CT imaging of the abdomen and pelvis was performed using the standard protocol following bolus administration of intravenous contrast. RADIATION DOSE REDUCTION: This exam was performed according to the departmental dose-optimization program which includes automated exposure control, adjustment of the mA and/or kV according to patient size and/or use of iterative reconstruction technique. CONTRAST:  163m OMNIPAQUE IOHEXOL 300 MG/ML  SOLN COMPARISON:  CT 02/27/2022 FINDINGS: Lower chest: Moderate right pleural effusion and associated atelectasis/consolidation. Right middle lobe and lower lobe infiltrates. Coronary artery calcification. Hepatobiliary: Hepatic steatosis. Redemonstrated small area of low attenuation in the inferior right hepatic lobe which measures 7 mm and is too small to definitively characterize. Peritoneal metastases along the anterior liver surface are redemonstrated. Gallbladder wall nodularity compatible with metastases. No biliary ductal dilation. Pancreas: Unremarkable. No pancreatic ductal dilatation or surrounding inflammatory changes. Spleen: Normal in size without focal  abnormality. Adrenals/Urinary Tract: Unremarkable adrenal glands. No urinary calculi or hydronephrosis. No focal renal lesion. Unremarkable bladder. Stomach/Bowel: Unremarkable stomach. Multiple loops of borderline dilated small bowel measuring up to 2.7 cm diameter are redemonstrated. This appears slightly increased from 02/27/2022. There are 2 adjacent transition points in the right lower abdomen (series 5/image 58 and series 5/image 67). Findings remain concerning for small bowel obstruction possibly secondary to peritoneal implants the areas of obstruction. Normal caliber of the colon. Vascular/Lymphatic: Aortic atherosclerosis. Unchanged enlargement of a 1.8 cm right inguinal lymph node. No lymphadenopathy in the abdomen or pelvis. Reproductive: Status post hysterectomy.  Pessary. Other: Small volume ascites in the pelvis, slightly increased from 02/27/2022. Redemonstrated peritoneal nodules compatible with peritoneal carcinomatosis. For example on the left on series 2/image 45. Musculoskeletal: No acute fracture. Multiple superior endplate compression fractures in the lower thoracic and lumbar spine are unchanged from 02/27/2022. IMPRESSION: 1. Slight worsening of small bowel obstruction with transition points in the right lower quadrant. 2. Similar peritoneal carcinomatosis. 3. Moderate right pleural effusion with atelectasis/infiltrates in the right middle and lower lobes. Electronically Signed   By: TPlacido SouM.D.   On: 03/09/2022 02:40   DG Chest Port 1 View  Result Date: 03/09/2022 CLINICAL DATA:  Nausea and vomiting; follow-up effusion EXAM: PORTABLE CHEST 1 VIEW COMPARISON:  Radiographs 03/01/2022 FINDINGS: Stable cardiomediastinal silhouette at upper limits of normal for size. Accessed right chest wall Port-A-Cath with tip in the low SVC. Increased size of the now moderate right pleural effusion. Associated compressive atelectasis/consolidation. The left lung is clear. No acute osseous  abnormality. IMPRESSION: Increased moderate right pleural effusion since 03/01/2022. Electronically Signed   By: TPlacido SouM.D.   On: 03/09/2022 00:22    Labs:  CBC: Recent Labs  02/28/22 0308 03/08/22 2326 03/10/22 0445 03/11/22 0209  WBC 4.0 7.7 5.0 3.8*  HGB 11.0* 12.8 11.0* 10.3*  HCT 34.3* 38.9 34.1* 32.2*  PLT 121* 138* 111* 103*    COAGS: Recent Labs    11/06/21 1715 12/22/21 1358 12/22/21 2318 12/23/21 0955 01/28/22 1655 03/09/22 1315  INR 1.1  --   --   --  1.4* 1.0  APTT 30 36 113* 103* 37*  --     BMP: Recent Labs    02/28/22 0308 03/08/22 2326 03/10/22 0445 03/11/22 0209  NA 136 139 136 137  K 4.2 3.7 3.6 3.2*  CL 105 104 101 102  CO2 '24 26 26 27  '$ GLUCOSE 115* 128* 123* 104*  BUN 25* '18 21 17  '$ CALCIUM 8.7* 10.0 9.1 8.8*  CREATININE 0.89 1.01* 1.11* 0.88  GFRNONAA >60 57* 51* >60    LIVER FUNCTION TESTS: Recent Labs    12/19/21 2315 02/27/22 0415 02/28/22 0308 03/08/22 2326  BILITOT 0.5 0.8 0.7 0.5  AST '18 18 16 22  '$ ALT '17 18 17 22  '$ ALKPHOS 84 72 68 63  PROT 5.4* 6.5 5.5* 6.8  ALBUMIN 2.8* 3.4* 2.8* 3.4*    Assessment and Plan: Pt with hx metastatic ovarian cancer, DVT/PE on OP Eliquis, recent UTI, small bowel obstruction, GERD, hypertension.  She is under home hospice care.  She was admitted to [facility] 9/10 with recurrent abdominal pain, nausea, vomiting as well as dyspnea with recurrent mod rt effusion; s/p rt pleurx cath placement 9/12 with removal of 1 liter of fluid; afebrile; WBC 3.8, hgb 10.3, creat nl;  CXR today-  1. Mild increase in the small to moderate right apical pneumothorax. Right chest tube in place. 2. Mild increase in small right pleural effusion. 3. Bibasilar atelectasis and/or consolidation  Images were reviewed by Dr. Maryelizabeth Kaufmann; apical ptx likely represents exvacuole change based on prior imaging review; will check am CXR; drain pleurx prn based on pt's symptoms(worsening dyspnea) and per  TRH/palliative wishes ; pleuritic pain may well be from tissue/rib/diaphragm irritation from cath tip and minimal pleural fluid; consider short term antiinflammatory agent, i.e. toradol  Electronically Signed: D. Rowe Robert, PA-C 03/11/2022, 11:22 AM   I spent a total of 15 Minutes at the the patient's bedside AND on the patient's hospital floor or unit, greater than 50% of which was counseling/coordinating care for right pleurx catheter     Patient ID: Haskel Peterson, female   DOB: 13-Oct-1940, 81 y.o.   MRN: [id number]

## 2022-03-11 NOTE — Progress Notes (Signed)
PROGRESS NOTE    Paula Peterson  KWI:[id number] DOB: 04/01/1941 DOA: 03/08/2022 PCP: Caryl Bis, MD    Brief Narrative:  This 81 years old female with PMH significant for DVT and PE on Eliquis, hypertension, ovarian cancer with peritoneal carcinomatosis and recurrent admissions for small bowel obstruction related to her malignancy now presented in the ED with abdominal pain,  nausea and vomiting.  Patient describes pain is similar to the prior experiences with SBO but is more severe. CT abdomen and pelvis demonstrates slight worsening of small bowel obstruction.  Patient is admitted for further evaluation. General surgery is consulted.  Patient is started on IV hydration, bowel rest, adequate pain control.  Patient is under home hospice care. Patient underwent Pleurix catheter for recurrent pleural effusion.  General surgery recommended vent G-tube for comfort.  Assessment & Plan:   Principal Problem:   SBO (small bowel obstruction) (HCC) Active Problems:   Right ovarian epithelial cancer (HCC)   PE (pulmonary thromboembolism) (HCC)   Recurrent UTI   Thrombocytopenia (HCC)   Recurrent right pleural effusion   Acute cystitis with hematuria  Small bowel obstruction: Patient has had recurrent partial small bowel obstruction due to her malignancy and presented with recurrent abdominal pain, nausea, vomiting and describes it more severe than previous episodes. Continue IV hydration,  bowel rest, pain control and antiemetics. Patient refuses NG tube. Continue conservative management and bowel rest. General surgery is consulted, potential palliative option for her recurring SBO would be venting gastrostomy tube.  UTI: Continue IV Rocephin for now.   Urine culture grew E. coli sensitive to ceftriaxone.  Recurrent right pleural effusion: IR is consulted.  Patient underwent Pleurix catheter for symptomatic relief. Repeat chest x-ray in the morning to evaluate.  History of DVT and  PE Resume Eliquis.  Right ovarian cancer: Multiple prolonged discussion has happened between patient and the oncologist about goals of care.  She is now currently enrolled in home based hospice program.  Her condition is noncurable, her prognosis is poor and with recurrent hospitalization is not possible that she can continue home hospice. Palliative care consulted to discuss goals of care.   DVT prophylaxis: SCDS Code Status: DNR Family Communication: Daughter at bed side. Disposition Plan:   Status is: Inpatient Remains inpatient appropriate because: Admitted for small bowel obstruction secondary to malignancy.  General surgery consulted,  patient needs conservative management, oncology is consulted palliative care consulted to discuss goals of care   Consultants:  General surgery Oncology Palliative care  Procedures: CT A/P  Antimicrobials:  Anti-infectives (From admission, onward)    Start     Dose/Rate Route Frequency Ordered Stop   03/10/22 0300  cefTRIAXone (ROCEPHIN) 1 g in sodium chloride 0.9 % 100 mL IVPB        1 g 200 mL/hr over 30 Minutes Intravenous Every 24 hours 03/09/22 0312     03/09/22 0300  cefTRIAXone (ROCEPHIN) 1 g in sodium chloride 0.9 % 100 mL IVPB        1 g 200 mL/hr over 30 Minutes Intravenous  Once 03/09/22 0250 03/09/22 0457       Subjective: Patient was seen and examined at bedside.  Overnight events noted.   Patient still reports having soreness in the mid abdomen.  Denies any nausea and vomiting. Patient underwent Pleurx catheter tolerated well.   Objective: Vitals:   03/10/22 1506 03/10/22 2033 03/11/22 0516 03/11/22 0900  BP: (!) 145/68 129/65 134/67 135/60  Pulse: (!) 101 (!) 101 97  94  Resp: '19 18 20 14  '$ Temp: 98.2 F (36.8 C) 99.2 F (37.3 C) 97.8 F (36.6 C) 98.5 F (36.9 C)  TempSrc: Oral Oral Oral Oral  SpO2: 98% 92% 92% 90%  Weight:   94.1 kg   Height:        Intake/Output Summary (Last 24 hours) at 03/11/2022  1434 Last data filed at 03/11/2022 0600 Gross per 24 hour  Intake 200 ml  Output 200 ml  Net 0 ml   Filed Weights   03/08/22 2219 03/10/22 0608 03/11/22 0516  Weight: 95.3 kg 89.5 kg 94.1 kg    Examination:  General exam: Appears comfortable, not in any acute distress.  Deconditioned Respiratory system: CTA bilaterally, No wheezing, No crackles, Normal respiratory effort. Cardiovascular system: S1 & S2 heard, regular rate and rhythm, no murmur. Gastrointestinal system: Abdomen is soft, sore, non distended, BS sluggish Central nervous system: Alert and oriented X 3. No focal neurological deficits. Extremities: No edema, no cyanosis, no clubbing Skin: No rashes, lesions or ulcers Psychiatry: Judgement and insight appear normal. Mood & affect appropriate.     Data Reviewed: I have personally reviewed following labs and imaging studies  CBC: Recent Labs  Lab 03/08/22 2326 03/10/22 0445 03/11/22 0209  WBC 7.7 5.0 3.8*  NEUTROABS 5.6  --   --   HGB 12.8 11.0* 10.3*  HCT 38.9 34.1* 32.2*  MCV 104.3* 106.9* 106.6*  PLT 138* 111* 818*   Basic Metabolic Panel: Recent Labs  Lab 03/08/22 2326 03/10/22 0445 03/11/22 0209  NA 139 136 137  K 3.7 3.6 3.2*  CL 104 101 102  CO2 '26 26 27  '$ GLUCOSE 128* 123* 104*  BUN '18 21 17  '$ CREATININE 1.01* 1.11* 0.88  CALCIUM 10.0 9.1 8.8*  MG  --  1.4* 1.7  PHOS  --  3.4  --    GFR: Estimated Creatinine Clearance: 62 mL/min (by C-G formula based on SCr of 0.88 mg/dL). Liver Function Tests: Recent Labs  Lab 03/08/22 2326  AST 22  ALT 22  ALKPHOS 63  BILITOT 0.5  PROT 6.8  ALBUMIN 3.4*   Recent Labs  Lab 03/08/22 2326  LIPASE 23   No results for input(s): "AMMONIA" in the last 168 hours. Coagulation Profile: Recent Labs  Lab 03/09/22 1315  INR 1.0   Cardiac Enzymes: No results for input(s): "CKTOTAL", "CKMB", "CKMBINDEX", "TROPONINI" in the last 168 hours. BNP (last 3 results) No results for input(s): "PROBNP" in the  last 8760 hours. HbA1C: No results for input(s): "HGBA1C" in the last 72 hours. CBG: No results for input(s): "GLUCAP" in the last 168 hours. Lipid Profile: No results for input(s): "CHOL", "HDL", "LDLCALC", "TRIG", "CHOLHDL", "LDLDIRECT" in the last 72 hours. Thyroid Function Tests: No results for input(s): "TSH", "T4TOTAL", "FREET4", "T3FREE", "THYROIDAB" in the last 72 hours. Anemia Panel: No results for input(s): "VITAMINB12", "FOLATE", "FERRITIN", "TIBC", "IRON", "RETICCTPCT" in the last 72 hours. Sepsis Labs: No results for input(s): "PROCALCITON", "LATICACIDVEN" in the last 168 hours.  Recent Results (from the past 240 hour(s))  Urine Culture     Status: Abnormal   Collection Time: 03/09/22  2:50 AM   Specimen: Urine, Clean Catch  Result Value Ref Range Status   Specimen Description   Final    URINE, CLEAN CATCH Performed at [facility], [place] [address] [place], [place] [address]    Special Requests   Final    NONE Performed at [facility], [address] [place], [place] [address]    Culture >=100,000 COLONIES/mL ESCHERICHIA COLI (A)  Final   Report Status 03/11/2022 FINAL  Final   Organism ID, Bacteria ESCHERICHIA COLI (A)  Final      Susceptibility   Escherichia coli - MIC*    AMPICILLIN 4 SENSITIVE Sensitive     CEFAZOLIN <=4 SENSITIVE Sensitive     CEFEPIME <=0.12 SENSITIVE Sensitive     CEFTRIAXONE <=0.25 SENSITIVE Sensitive     CIPROFLOXACIN <=0.25 SENSITIVE Sensitive     GENTAMICIN <=1 SENSITIVE Sensitive     IMIPENEM <=0.25 SENSITIVE Sensitive     NITROFURANTOIN <=16 SENSITIVE Sensitive     TRIMETH/SULFA <=20 SENSITIVE Sensitive     AMPICILLIN/SULBACTAM <=2 SENSITIVE Sensitive     PIP/TAZO <=4 SENSITIVE Sensitive     * >=100,000 COLONIES/mL ESCHERICHIA COLI     Radiology Studies: DG Chest Port 1 View  Result Date: 03/11/2022 CLINICAL DATA:  Right pneumothorax. EXAM: PORTABLE CHEST 1 VIEW COMPARISON:  Chest x-ray  03/10/2022. FINDINGS: Mild increase in the small to moderate right apical pneumothorax. Right chest tube in place. Mild increase in small right pleural effusion. Similar position of a right IJ approach central venous. Bibasilar opacities. Cardiomediastinal silhouette is within normal limits. IMPRESSION: 1. Mild increase in the small to moderate right apical pneumothorax. Right chest tube in place. 2. Mild increase in small right pleural effusion. 3. Bibasilar atelectasis and/or consolidation. These results will be called to the ordering clinician or representative by the Radiologist Assistant, and communication documented in the PACS or [employer]. Electronically Signed   By: Margaretha Sheffield M.D.   On: 03/11/2022 08:12   IR PERC PLEURAL DRAIN W/INDWELL CATH W/IMG GUIDE  Result Date: 03/10/2022 CLINICAL DATA:  Recurrent pleural effusion. Briefly, 81 year old female with stage IV ovarian cancer with recurrent effusion. Last thoracentesis 03/01/2022. EXAM: Procedures: 1. INSERTION OF RIGHT CHEST TUNNELED PLEURAL DRAINAGE (PLEURX) CATHETER 2. THERAPEUTIC THORACENTESIS COMPARISON:  Chest XR, 03/09/2022. IR ultrasound, 03/01/2022. CT chest, 01/28/2022. MEDICATIONS: The patient was on scheduled IV antibiotics. ANESTHESIA/SEDATION: Moderate (conscious) sedation was employed during this procedure. A total of Versed 2 mg and Fentanyl 200 mcg was administered intravenously. Moderate Sedation Time: 26 minutes. The patient's level of consciousness and vital signs were monitored continuously by radiology nursing throughout the procedure under my direct supervision. FLUOROSCOPY: Fluoroscopic dose; 0 mGy COMPLICATIONS: None immediate. PROCEDURE: The procedure, risks, benefits, and alternatives were explained to the the patient and/or patient's representative, who wishes to proceed with the placement of this permanent pleural catheter as they are seeking palliative care. The the patient and/or patient's representative  understands and consents to the procedure. The RIGHT lateral chest and upper abdomen were prepped and draped in a sterile fashion, and a sterile drape was applied covering the operative field. A sterile gown and sterile gloves were used for the procedure. Initial ultrasound scanning and fluoroscopic imaging demonstrates a recurrent moderate to large pleural effusion. Under direct ultrasound guidance, the inferior lateral pleural space was accessed with a Yueh sheath needle after the overlying soft tissues were anesthetized with 1% lidocaine with epinephrine. An Amplatz super stiff wire was then advanced under fluoroscopy into the pleural space. A 15.5 Fr tunneled Pleur-X catheter was tunneled from an incision within the right upper abdominal quadrant to the access site. The pleural access site was serially dilated under fluoroscopy, ultimately allowing placement of a peel-away sheath. The catheter was advanced through the peel-away sheath. The sheath was then removed. Final catheter positioning was confirmed with a  fluoroscopic radiographic image. The access incision was closed with an interrupted subcutaneous subcuticular 3-0 Vicryl then Dermabond was applied at the skin. A 2-0 Ethilon retention suture was applied at the catheter exit site. Large volume thoracentesis was performed through the new catheter utilizing provided bulb vacuum assisted drainage bag. Dressings were applied. The patient tolerated the above procedure well without immediate postprocedural complication. FINDINGS: Preprocedural ultrasound scanning demonstrates a recurrent moderate sized RIGHT sided pleural effusion. After ultrasound and fluoroscopic guided placement, the catheter is directed medial basilar Following catheter placement, approximately 1000 mL of serous pleural fluid was removed. IMPRESSION: 1. Successful placement of cuffed RIGHT tunneled pleural drainage (PleurX) catheter, as above. 2. Therapeutic thoracentesis was performed,  with 1000 mL of serous pleural fluid removed following catheter placement. Michaelle Birks, MD [facility] Radiology Electronically Signed   By: Michaelle Birks M.D.   On: 03/10/2022 21:21   DG Chest Port 1 View  Result Date: 03/10/2022 CLINICAL DATA:  Postop.  RIGHT pleural catheter placement EXAM: PORTABLE CHEST 1 VIEW COMPARISON:  Chest XR, 03/09/2022. IR fluoroscopy, earlier same day. CT chest, 01/28/2022. FINDINGS: Support lines: RIGHT IJ chest port, with catheter tip at the superior cavoatrial junction, unchanged. New RIGHT basilar pleural drainage catheter, with tip projecting apical along the mid chest. Cardiomediastinal silhouette is unchanged and within normal limits. The LEFT lung remains well inflated. Improved inflation of the RIGHT lung, with interval decrease in pleural effusion, previously moderate now small volume of residual trace RIGHT apical pneumothorax. No interval osseous abnormality IMPRESSION: 1. Trace RIGHT apical pneumothorax 2. New RIGHT basilar pleural drainage catheter with decreased pleural effusion, now small volume of residual. These results were conveyed at the time of interpretation on 03/10/2022 at 3:31 pm to provider [facility] , who verbally acknowledged these results. Electronically Signed   By: Michaelle Birks M.D.   On: 03/10/2022 15:36    Scheduled Meds:  apixaban  5 mg Oral BID   Chlorhexidine Gluconate Cloth  6 each Topical Daily   fentaNYL  1 patch Transdermal Q72H   Continuous Infusions:  cefTRIAXone (ROCEPHIN)  IV Stopped (03/11/22 0630)   promethazine (PHENERGAN) injection (IM or IVPB)       LOS: 2 days    Time spent: 35 mins    Teighlor Korson, MD [facility]   If 7PM-7AM, please contact night-coverage

## 2022-03-11 NOTE — Consult Note (Signed)
   [facility] Inpatient Consult   03/11/2022  Rashanna Christiana [facility] 1940-11-19 [id number]  [facility] [[facility]] Patient: [employer]   Primary Care Provider:  Caryl Bis, MD  [facility] Liaison coverage for [facility]  *Referral for [facility] Readmission Report review   Patient screened for hospitalization with noted extreme high risk score for unplanned readmission risk and  to assess for readmission prevention needs.   Review of patient's medical record reveals patient is active with home hospice. Reviewed MD notes for possible surgical management for comfort.  Plan:  Review completed and  follow for disposition. If patient continues with hospice care then her care management needs will be with that team.  For questions contact:   Natividad Brood, RN BSN [facility] Liaison  [phone] business mobile phone Toll free office [phone]  Fax number: [phone] [email or web address]

## 2022-03-11 NOTE — Plan of Care (Signed)
Pt is alert and oriented x 4. Limited activity during overnight related to pain. Pt remains npo ice chips at bedside. Pt continues to have severe pain around drain site. Heat applied, Dilaudid given every 3 hours and a 1 time dose of morphine received from Clarene Essex NP.  Problem: Activity: Goal: Risk for activity intolerance will decrease Outcome: Not Progressing   Problem: Nutrition: Goal: Adequate nutrition will be maintained Outcome: Not Progressing   Problem: Pain Managment: Goal: General experience of comfort will improve Outcome: Not Progressing   Problem: Education: Goal: Knowledge of General Education information will improve Description: Including pain rating scale, medication(s)/side effects and non-pharmacologic comfort measures Outcome: Progressing   Problem: Health Behavior/Discharge Planning: Goal: Ability to manage health-related needs will improve Outcome: Progressing   Problem: Clinical Measurements: Goal: Ability to maintain clinical measurements within normal limits will improve Outcome: Progressing Goal: Will remain free from infection Outcome: Progressing Goal: Diagnostic test results will improve Outcome: Progressing Goal: Respiratory complications will improve Outcome: Progressing Goal: Cardiovascular complication will be avoided Outcome: Progressing   Problem: Coping: Goal: Level of anxiety will decrease Outcome: Progressing   Problem: Elimination: Goal: Will not experience complications related to bowel motility Outcome: Progressing Goal: Will not experience complications related to urinary retention Outcome: Progressing   Problem: Safety: Goal: Ability to remain free from injury will improve Outcome: Progressing   Problem: Skin Integrity: Goal: Risk for impaired skin integrity will decrease Outcome: Progressing

## 2022-03-12 ENCOUNTER — Inpatient Hospital Stay (HOSPITAL_COMMUNITY)

## 2022-03-12 ENCOUNTER — Encounter: Payer: Self-pay | Admitting: Hematology and Oncology

## 2022-03-12 DIAGNOSIS — Z7189 Other specified counseling: Secondary | ICD-10-CM | POA: Diagnosis not present

## 2022-03-12 DIAGNOSIS — C561 Malignant neoplasm of right ovary: Secondary | ICD-10-CM

## 2022-03-12 DIAGNOSIS — Z515 Encounter for palliative care: Secondary | ICD-10-CM | POA: Diagnosis not present

## 2022-03-12 DIAGNOSIS — K56609 Unspecified intestinal obstruction, unspecified as to partial versus complete obstruction: Secondary | ICD-10-CM

## 2022-03-12 LAB — CBC
HCT: 33.2 % — ABNORMAL LOW (ref 36.0–46.0)
Hemoglobin: 10.7 g/dL — ABNORMAL LOW (ref 12.0–15.0)
MCH: 33.5 pg (ref 26.0–34.0)
MCHC: 32.2 g/dL (ref 30.0–36.0)
MCV: 104.1 fL — ABNORMAL HIGH (ref 80.0–100.0)
Platelets: 108 10*3/uL — ABNORMAL LOW (ref 150–400)
RBC: 3.19 MIL/uL — ABNORMAL LOW (ref 3.87–5.11)
RDW: 15 % (ref 11.5–15.5)
WBC: 3.8 10*3/uL — ABNORMAL LOW (ref 4.0–10.5)
nRBC: 0 % (ref 0.0–0.2)

## 2022-03-12 LAB — BASIC METABOLIC PANEL
Anion gap: 9 (ref 5–15)
BUN: 13 mg/dL (ref 8–23)
CO2: 25 mmol/L (ref 22–32)
Calcium: 8.7 mg/dL — ABNORMAL LOW (ref 8.9–10.3)
Chloride: 101 mmol/L (ref 98–111)
Creatinine, Ser: 0.86 mg/dL (ref 0.44–1.00)
GFR, Estimated: >60 mL/min (ref >60)
Glucose, Bld: 104 mg/dL — ABNORMAL HIGH (ref 70–99)
Potassium: 3.3 mmol/L — ABNORMAL LOW (ref 3.5–5.1)
Sodium: 135 mmol/L (ref 135–145)

## 2022-03-12 LAB — PHOSPHORUS: Phosphorus: 2 mg/dL — ABNORMAL LOW (ref 2.5–4.6)

## 2022-03-12 LAB — MAGNESIUM: Magnesium: 1.5 mg/dL — ABNORMAL LOW (ref 1.7–2.4)

## 2022-03-12 MED ORDER — MAGNESIUM SULFATE 2 GM/50ML IV SOLN
2.0000 g | Freq: Once | INTRAVENOUS | Status: AC
  Administered 2022-03-12: 2 g via INTRAVENOUS
  Filled 2022-03-12: qty 50

## 2022-03-12 MED ORDER — DEXAMETHASONE SODIUM PHOSPHATE 10 MG/ML IJ SOLN
4.0000 mg | INTRAMUSCULAR | Status: DC
  Administered 2022-03-12 – 2022-03-17 (×6): 4 mg via INTRAVENOUS
  Filled 2022-03-12 (×6): qty 1

## 2022-03-12 MED ORDER — KETOROLAC TROMETHAMINE 30 MG/ML IJ SOLN
30.0000 mg | Freq: Four times a day (QID) | INTRAMUSCULAR | Status: AC
  Administered 2022-03-12 – 2022-03-13 (×5): 30 mg via INTRAVENOUS
  Filled 2022-03-12 (×5): qty 1

## 2022-03-12 MED ORDER — POTASSIUM PHOSPHATES 15 MMOLE/5ML IV SOLN
30.0000 mmol | Freq: Once | INTRAVENOUS | Status: AC
  Administered 2022-03-12: 30 mmol via INTRAVENOUS
  Filled 2022-03-12: qty 10

## 2022-03-12 MED ORDER — HYDROMORPHONE HCL 1 MG/ML IJ SOLN
1.0000 mg | INTRAMUSCULAR | Status: DC
  Administered 2022-03-12 – 2022-03-13 (×10): 1 mg via INTRAVENOUS
  Filled 2022-03-12 (×10): qty 1

## 2022-03-12 MED ORDER — HYDROMORPHONE HCL 1 MG/ML IJ SOLN
0.5000 mg | INTRAMUSCULAR | Status: DC | PRN
  Administered 2022-03-17: 0.5 mg via INTRAVENOUS
  Filled 2022-03-12: qty 0.5

## 2022-03-12 NOTE — Progress Notes (Signed)
PROGRESS NOTE    Paula Peterson  GUY:[id number] DOB: 1941/03/26 DOA: 03/08/2022 PCP: Caryl Bis, MD    Brief Narrative:  This 81 years old female with PMH significant for DVT and PE on Eliquis, hypertension, ovarian cancer with peritoneal carcinomatosis and recurrent admissions for small bowel obstruction related to her malignancy now presented in the ED with abdominal pain,  nausea and vomiting.  Patient describes pain is similar to the prior experiences with SBO but is more severe. CT abdomen and pelvis demonstrates slight worsening of small bowel obstruction.  Patient is admitted for further evaluation. General surgery is consulted. Patient is started on IV hydration, bowel rest, adequate pain control.  Patient is under home hospice care. Patient underwent Pleurix catheter for recurrent pleural effusion.  General surgery recommended for venting G-tube for comfort but considered not an ideal candidate. Palliative care is meeting family to discuss goals of care.   Assessment & Plan:   Principal Problem:   SBO (small bowel obstruction) (HCC) Active Problems:   Right ovarian epithelial cancer (HCC)   PE (pulmonary thromboembolism) (HCC)   Recurrent UTI   Thrombocytopenia (HCC)   Recurrent right pleural effusion   Acute cystitis with hematuria  Small bowel obstruction: Patient has had recurrent partial small bowel obstruction due to her malignancy and presented with recurrent abdominal pain, nausea, vomiting and describes it more severe than previous episodes. Continue IV hydration,  bowel rest, pain control and antiemetics. Patient refuses NG tube. Continue conservative management and bowel rest. General surgery is consulted, potential palliative option for her recurring SBO would be venting gastrostomy tube. She is considered not a candidate for ven G tube as per IR  UTI: Completed ceftriaxone for 4 days. Urine culture grew E. coli sensitive to ceftriaxone.  Recurrent right  pleural effusion: IR is consulted.  Patient underwent Pleurix catheter for symptomatic relief. Repeat chest x-ray in the morning to evaluate.  History of DVT and PE Resumed Eliquis.  Right ovarian cancer: Multiple prolonged discussion has happened between patient and the oncologist about goals of care.  She is now currently enrolled in home based hospice program.  Her condition is noncurable, her prognosis is poor and with recurrent hospitalization is not possible that she can continue home hospice. Palliative care consulted to discuss goals of care.   DVT prophylaxis: SCDS Code Status: DNR Family Communication: Daughter at bed side. Disposition Plan:   Status is: Inpatient Remains inpatient appropriate because: Admitted for small bowel obstruction secondary to malignancy.  General surgery consulted,  patient needs conservative management, oncology is consulted,  palliative care consulted to discuss goals of care.   Consultants:  General surgery Oncology Palliative care  Procedures: CT A/P  Antimicrobials:  Anti-infectives (From admission, onward)    Start     Dose/Rate Route Frequency Ordered Stop   03/10/22 0300  cefTRIAXone (ROCEPHIN) 1 g in sodium chloride 0.9 % 100 mL IVPB  Status:  Discontinued        1 g 200 mL/hr over 30 Minutes Intravenous Every 24 hours 03/09/22 0312 03/12/22 0832   03/09/22 0300  cefTRIAXone (ROCEPHIN) 1 g in sodium chloride 0.9 % 100 mL IVPB        1 g 200 mL/hr over 30 Minutes Intravenous  Once 03/09/22 0250 03/09/22 0457       Subjective: Patient was seen and examined at bedside.  Overnight events noted.   Patient reports having soreness around the Pleurx catheter. Still reports having soreness in the mid abdomen.  Denies any nausea and vomiting. Patient underwent Pleurx catheter tolerated well.   Objective: Vitals:   03/11/22 1435 03/11/22 2016 03/12/22 0456 03/12/22 0500  BP: 132/63 (!) 124/58 (!) 141/68   Pulse: 87 100 100   Resp:  '18 18 16   '$ Temp: 98.4 F (36.9 C) 98.8 F (37.1 C) 98.3 F (36.8 C)   TempSrc: Oral Oral Oral   SpO2: 91% 91% 91%   Weight:    96 kg  Height:        Intake/Output Summary (Last 24 hours) at 03/12/2022 1426 Last data filed at 03/12/2022 0325 Gross per 24 hour  Intake --  Output 350 ml  Net -350 ml   Filed Weights   03/10/22 0608 03/11/22 0516 03/12/22 0500  Weight: 89.5 kg 94.1 kg 96 kg    Examination:  General exam: Appears deconditioned, comfortable, not in any acute distress. Respiratory system: CTA bilaterally, no accessory muscle use, normal respiratory effort.  RR 15 Cardiovascular system: S1 & S2 heard, regular rate and rhythm, no murmur. Gastrointestinal system: Abdomen is soft, nondistended, tender, BS: sluggish Central nervous system: Alert and oriented X 3. No focal neurological deficits. Extremities: No edema, no cyanosis, no clubbing Skin: No rashes, lesions or ulcers Psychiatry: Judgement and insight appear normal. Mood & affect appropriate.     Data Reviewed: I have personally reviewed following labs and imaging studies  CBC: Recent Labs  Lab 03/08/22 2326 03/10/22 0445 03/11/22 0209 03/12/22 0500  WBC 7.7 5.0 3.8* 3.8*  NEUTROABS 5.6  --   --   --   HGB 12.8 11.0* 10.3* 10.7*  HCT 38.9 34.1* 32.2* 33.2*  MCV 104.3* 106.9* 106.6* 104.1*  PLT 138* 111* 103* 426*   Basic Metabolic Panel: Recent Labs  Lab 03/08/22 2326 03/10/22 0445 03/11/22 0209 03/12/22 0500  NA 139 136 137 135  K 3.7 3.6 3.2* 3.3*  CL 104 101 102 101  CO2 '26 26 27 25  '$ GLUCOSE 128* 123* 104* 104*  BUN '18 21 17 13  '$ CREATININE 1.01* 1.11* 0.88 0.86  CALCIUM 10.0 9.1 8.8* 8.7*  MG  --  1.4* 1.7 1.5*  PHOS  --  3.4  --  2.0*   GFR: Estimated Creatinine Clearance: 64.2 mL/min (by C-G formula based on SCr of 0.86 mg/dL). Liver Function Tests: Recent Labs  Lab 03/08/22 2326  AST 22  ALT 22  ALKPHOS 63  BILITOT 0.5  PROT 6.8  ALBUMIN 3.4*   Recent Labs  Lab  03/08/22 2326  LIPASE 23   No results for input(s): "AMMONIA" in the last 168 hours. Coagulation Profile: Recent Labs  Lab 03/09/22 1315  INR 1.0   Cardiac Enzymes: No results for input(s): "CKTOTAL", "CKMB", "CKMBINDEX", "TROPONINI" in the last 168 hours. BNP (last 3 results) No results for input(s): "PROBNP" in the last 8760 hours. HbA1C: No results for input(s): "HGBA1C" in the last 72 hours. CBG: No results for input(s): "GLUCAP" in the last 168 hours. Lipid Profile: No results for input(s): "CHOL", "HDL", "LDLCALC", "TRIG", "CHOLHDL", "LDLDIRECT" in the last 72 hours. Thyroid Function Tests: No results for input(s): "TSH", "T4TOTAL", "FREET4", "T3FREE", "THYROIDAB" in the last 72 hours. Anemia Panel: No results for input(s): "VITAMINB12", "FOLATE", "FERRITIN", "TIBC", "IRON", "RETICCTPCT" in the last 72 hours. Sepsis Labs: No results for input(s): "PROCALCITON", "LATICACIDVEN" in the last 168 hours.  Recent Results (from the past 240 hour(s))  Urine Culture     Status: Abnormal   Collection Time: 03/09/22  2:50 AM  Specimen: Urine, Clean Catch  Result Value Ref Range Status   Specimen Description   Final    URINE, CLEAN CATCH Performed at [facility], [place] [address] [place], [place] [address]    Special Requests   Final    NONE Performed at [facility], [place] [address], [place], [place] [address]    Culture >=100,000 COLONIES/mL ESCHERICHIA COLI (A)  Final   Report Status 03/11/2022 FINAL  Final   Organism ID, Bacteria ESCHERICHIA COLI (A)  Final      Susceptibility   Escherichia coli - MIC*    AMPICILLIN 4 SENSITIVE Sensitive     CEFAZOLIN <=4 SENSITIVE Sensitive     CEFEPIME <=0.12 SENSITIVE Sensitive     CEFTRIAXONE <=0.25 SENSITIVE Sensitive     CIPROFLOXACIN <=0.25 SENSITIVE Sensitive     GENTAMICIN <=1 SENSITIVE Sensitive     IMIPENEM <=0.25 SENSITIVE Sensitive     NITROFURANTOIN <=16 SENSITIVE Sensitive      TRIMETH/SULFA <=20 SENSITIVE Sensitive     AMPICILLIN/SULBACTAM <=2 SENSITIVE Sensitive     PIP/TAZO <=4 SENSITIVE Sensitive     * >=100,000 COLONIES/mL ESCHERICHIA COLI     Radiology Studies: DG Chest Port 1 View  Result Date: 03/12/2022 CLINICAL DATA:  Pneumothorax EXAM: PORTABLE CHEST 1 VIEW COMPARISON:  Chest radiograph 1 day prior FINDINGS: The right chest wall port and right chest tube are stable. The cardiomediastinal silhouette is stable. Right larger than left pleural effusions are again seen with bibasilar airspace opacities. The previously seen right pneumothorax is slightly decreased in size. There is no left pneumothorax The bones are stable. IMPRESSION: 1. Slightly decreased size of the small right apical pneumothorax with unchanged right chest tube. 2. Stable bilateral pleural effusions with adjacent airspace opacities. Electronically Signed   By: Valetta Mole M.D.   On: 03/12/2022 08:14   DG Chest Port 1 View  Result Date: 03/11/2022 CLINICAL DATA:  Right pneumothorax. EXAM: PORTABLE CHEST 1 VIEW COMPARISON:  Chest x-ray 03/10/2022. FINDINGS: Mild increase in the small to moderate right apical pneumothorax. Right chest tube in place. Mild increase in small right pleural effusion. Similar position of a right IJ approach central venous. Bibasilar opacities. Cardiomediastinal silhouette is within normal limits. IMPRESSION: 1. Mild increase in the small to moderate right apical pneumothorax. Right chest tube in place. 2. Mild increase in small right pleural effusion. 3. Bibasilar atelectasis and/or consolidation. These results will be called to the ordering clinician or representative by the Radiologist Assistant, and communication documented in the PACS or [employer]. Electronically Signed   By: Margaretha Sheffield M.D.   On: 03/11/2022 08:12   IR PERC PLEURAL DRAIN W/INDWELL CATH W/IMG GUIDE  Result Date: 03/10/2022 CLINICAL DATA:  Recurrent pleural effusion. Briefly, 81 year old  female with stage IV ovarian cancer with recurrent effusion. Last thoracentesis 03/01/2022. EXAM: Procedures: 1. INSERTION OF RIGHT CHEST TUNNELED PLEURAL DRAINAGE (PLEURX) CATHETER 2. THERAPEUTIC THORACENTESIS COMPARISON:  Chest XR, 03/09/2022. IR ultrasound, 03/01/2022. CT chest, 01/28/2022. MEDICATIONS: The patient was on scheduled IV antibiotics. ANESTHESIA/SEDATION: Moderate (conscious) sedation was employed during this procedure. A total of Versed 2 mg and Fentanyl 200 mcg was administered intravenously. Moderate Sedation Time: 26 minutes. The patient's level of consciousness and vital signs were monitored continuously by radiology nursing throughout the procedure under my direct supervision. FLUOROSCOPY: Fluoroscopic dose; 0 mGy COMPLICATIONS: None immediate. PROCEDURE: The procedure, risks, benefits, and alternatives were explained to the the patient and/or patient's representative, who wishes to proceed with the placement  of this permanent pleural catheter as they are seeking palliative care. The the patient and/or patient's representative understands and consents to the procedure. The RIGHT lateral chest and upper abdomen were prepped and draped in a sterile fashion, and a sterile drape was applied covering the operative field. A sterile gown and sterile gloves were used for the procedure. Initial ultrasound scanning and fluoroscopic imaging demonstrates a recurrent moderate to large pleural effusion. Under direct ultrasound guidance, the inferior lateral pleural space was accessed with a Yueh sheath needle after the overlying soft tissues were anesthetized with 1% lidocaine with epinephrine. An Amplatz super stiff wire was then advanced under fluoroscopy into the pleural space. A 15.5 Fr tunneled Pleur-X catheter was tunneled from an incision within the right upper abdominal quadrant to the access site. The pleural access site was serially dilated under fluoroscopy, ultimately allowing placement of a  peel-away sheath. The catheter was advanced through the peel-away sheath. The sheath was then removed. Final catheter positioning was confirmed with a fluoroscopic radiographic image. The access incision was closed with an interrupted subcutaneous subcuticular 3-0 Vicryl then Dermabond was applied at the skin. A 2-0 Ethilon retention suture was applied at the catheter exit site. Large volume thoracentesis was performed through the new catheter utilizing provided bulb vacuum assisted drainage bag. Dressings were applied. The patient tolerated the above procedure well without immediate postprocedural complication. FINDINGS: Preprocedural ultrasound scanning demonstrates a recurrent moderate sized RIGHT sided pleural effusion. After ultrasound and fluoroscopic guided placement, the catheter is directed medial basilar Following catheter placement, approximately 1000 mL of serous pleural fluid was removed. IMPRESSION: 1. Successful placement of cuffed RIGHT tunneled pleural drainage (PleurX) catheter, as above. 2. Therapeutic thoracentesis was performed, with 1000 mL of serous pleural fluid removed following catheter placement. Michaelle Birks, MD [facility] Radiology Electronically Signed   By: Michaelle Birks M.D.   On: 03/10/2022 21:21   DG Chest Port 1 View  Result Date: 03/10/2022 CLINICAL DATA:  Postop.  RIGHT pleural catheter placement EXAM: PORTABLE CHEST 1 VIEW COMPARISON:  Chest XR, 03/09/2022. IR fluoroscopy, earlier same day. CT chest, 01/28/2022. FINDINGS: Support lines: RIGHT IJ chest port, with catheter tip at the superior cavoatrial junction, unchanged. New RIGHT basilar pleural drainage catheter, with tip projecting apical along the mid chest. Cardiomediastinal silhouette is unchanged and within normal limits. The LEFT lung remains well inflated. Improved inflation of the RIGHT lung, with interval decrease in pleural effusion, previously moderate now small  volume of residual trace RIGHT apical pneumothorax. No interval osseous abnormality IMPRESSION: 1. Trace RIGHT apical pneumothorax 2. New RIGHT basilar pleural drainage catheter with decreased pleural effusion, now small volume of residual. These results were conveyed at the time of interpretation on 03/10/2022 at 3:31 pm to provider [facility] , who verbally acknowledged these results. Electronically Signed   By: Michaelle Birks M.D.   On: 03/10/2022 15:36    Scheduled Meds:  apixaban  5 mg Oral BID   Chlorhexidine Gluconate Cloth  6 each Topical Daily   dexamethasone (DECADRON) injection  4 mg Intravenous Q24H   fentaNYL  1 patch Transdermal Q72H    HYDROmorphone (DILAUDID) injection  1 mg Intravenous Q3H   ketorolac  30 mg Intravenous Q6H   Continuous Infusions:  potassium PHOSPHATE IVPB (in mmol) 30 mmol (03/12/22 1058)   promethazine (PHENERGAN) injection (IM or IVPB)       LOS: 3 days    Time spent: 35 mins    Beulah Matusek,  MD [facility]   If 7PM-7AM, please contact night-coverage

## 2022-03-12 NOTE — Progress Notes (Signed)
Palliative:  HPI: 81 y.o. female  with past medical history of ovarian cancer with peritoneal carcinomatosis with recurrent admission for SBO, DVT and PE on Eliquis, hypertension, stroke, GERD admitted on 03/08/2022 with abdominal pain with nausea/vomiting related to partial bowel obstruction, UTI, and recurrent right pleural effusion s/p PleurX drain 9/12.    I met today with Paula Peterson and daughter, Paula Peterson, at bedside. We discussed Paula Peterson's status and she continues with significant pain mostly from PleurX drain. She has had flatus and feels at times she needs to have a BM and movement to get to commode has triggered worsening pain. Discussed pain plan with Paula Peterson and Paula Peterson with scheduled IV pain medication and breakthrough pain doses available. Discussed that if pain persists we may consider increasing fentanyl patch. We discussed potential of venting PEG and if this is something that Paula Peterson would want if an option in the future. Paula Peterson has had further conversations with hospice to plan for further decline and know how they can assist with symptom management at home and in hospice facility. At this time they are hopeful to treat what we can to give her more quality time with her family but priority is comfort and minimizing suffering.   All questions/concerns addressed. Emotional support provided.   Exam: Alert and oriented but sleepy from pain medication. No distress. Breathing regular, unlabored. Abd still distended. Moves all extremities.  Plan: - DNR - Pain: Dilaudid 1 mg IV every 3 hours scheduled and 0.5 mg IV every hour PRN. Toradol 30 mg IV every 6 hours for 24 hours. Adding back decadron 4 mg IV daily for now.  - Watchful waiting for bowel obstruction - no nausea/vomiting. +flatus. - Hopeful to return back to her home with hospice support and family support.   [place], NP Palliative Medicine Team Pager [phone] (Please see [email or web address] for schedule) Team Phone  [phone]    Greater than 50%  of this time was spent counseling and coordinating care related to the above assessment and plan

## 2022-03-12 NOTE — Care Management Important Message (Signed)
Important Message  Patient Details Hospice Patient. Name: Paula Peterson MRN: [id number] Date of Birth: 06/15/1941   Medicare Important Message Given:  No     Kerin Salen 03/12/2022, 9:52 AM

## 2022-03-12 NOTE — TOC Initial Note (Signed)
Transition of Care [facility]) - Initial/Assessment Note    Patient Details  Name: Paula Peterson MRN: [id number] Date of Birth: 1941/05/09  [facility] CM/SW Contact:    Vassie Moselle, LCSW Phone Number: 03/12/2022, 9:00 AM  Clinical Narrative:                 Pt currently active with [facility]. Pt currently plans to return home with hospice care. CSW will continue to follow for further recommendations and discharge planning.   Expected Discharge Plan: Home w Hospice Care Barriers to Discharge: No Barriers Identified, Continued Medical Work up   Patient Goals and CMS Choice Patient states their goals for this hospitalization and ongoing recovery are:: To return home with hospice care   Choice offered to / list presented to : Patient, Adult Children  Expected Discharge Plan and Services Expected Discharge Plan: Home w Hospice Care In-house Referral: Hospice / Palliative Care Discharge Planning Services: NA Post Acute Care Choice: Hospice Living arrangements for the past 2 months: Single Family Home                 DME Arranged: N/A DME Agency: NA                  Prior Living Arrangements/Services Living arrangements for the past 2 months: Single Family Home Lives with:: Adult Children Patient language and need for interpreter reviewed:: Yes Do you feel safe going back to the place where you live?: Yes      Need for Family Participation in Patient Care: No (Comment) Care giver support system in place?: Yes (comment) Current home services: DME, Hospice Criminal Activity/Legal Involvement Pertinent to Current Situation/Hospitalization: No - Comment as needed  Activities of Daily Living Home Assistive Devices/Equipment: Walker (specify type) ADL Screening (condition at time of admission) Patient's cognitive ability adequate to safely complete daily activities?: Yes Is the patient deaf or have difficulty hearing?: No Does the patient  have difficulty seeing, even when wearing glasses/contacts?: No Does the patient have difficulty concentrating, remembering, or making decisions?: No Patient able to express need for assistance with ADLs?: Yes Does the patient have difficulty dressing or bathing?: Yes Independently performs ADLs?: No Communication: Independent Dressing (OT): Needs assistance Is this a change from baseline?: Pre-admission baseline Grooming: Needs assistance Is this a change from baseline?: Pre-admission baseline Feeding: Independent Bathing: Needs assistance Is this a change from baseline?: Pre-admission baseline Toileting: Needs assistance Is this a change from baseline?: Pre-admission baseline In/Out Bed: Needs assistance Is this a change from baseline?: Pre-admission baseline Walks in Home: Independent Does the patient have difficulty walking or climbing stairs?: No Weakness of Legs: None Weakness of Arms/Hands: None  Permission Sought/Granted   Permission granted to share information with : No              Emotional Assessment Appearance:: Appears stated age Attitude/Demeanor/Rapport: Unable to Assess Affect (typically observed): Unable to Assess Orientation: : Oriented to Self, Oriented to Place, Oriented to  Time, Oriented to Situation Alcohol / Substance Use: Not Applicable Psych Involvement: No (comment)  Admission diagnosis:  SBO (small bowel obstruction) (HCC) [K56.609] Acute cystitis with hematuria [N30.01] Recurrent right pleural effusion [J90] Patient Active Problem List   Diagnosis Date Noted   Acute cystitis with hematuria    SBO (small bowel obstruction) ([place]) 02/27/2022   Intractable pain 02/27/2022   Hospice care patient 01/30/2022   Chest pain 01/28/2022   Recurrent right pleural effusion 01/28/2022  Compression fracture of body of thoracic vertebra (HCC) 01/06/2022   Partial small bowel obstruction (HCC) 12/20/2021   Chronic anemia 12/20/2021   Thrombocytopenia  ([place]) 12/20/2021   Cellulitis 11/10/2021   Obesity (BMI 30-39.9) 11/07/2021   Macrocytic anemia 11/07/2021   Poor dentition 10/28/2021   Inflammatory arthritis 08/19/2021   Deficiency anemia 08/19/2021   Intermittent small bowel obstruction ([place]) 06/26/2021   Anemia due to antineoplastic chemotherapy 02/28/2021   Skin infection 02/14/2021   Ear pain 02/07/2021   Drug-induced neutropenia (HCC) 01/17/2021   Headache disorder 10/14/2020   CKD (chronic kidney disease), stage III ([place]) 10/01/2020   Hand foot syndrome 09/10/2020   Pulmonary embolism ([place]) 07/15/2020   Hypokalemia 07/15/2020   Essential hypertension 07/15/2020   Lower back pain 05/28/2020   Elevated serum creatinine 05/01/2020   Recurrent UTI 04/03/2020   Genetic testing 08/11/2019   Adverse effect of chemotherapy 03/14/2019   Pancytopenia, acquired ([place]) 03/10/2019   Peripheral neuropathy due to chemotherapy ([place]) 03/10/2019   Epigastric pain 02/09/2019   PE (pulmonary thromboembolism) ([place]) 02/02/2019   Chronic hip pain 01/27/2019   Acquired hypothyroidism 01/27/2019   Family history of stomach cancer    Goals of care, counseling/discussion 01/06/2019   Elevated liver enzymes 01/06/2019   Other fatigue 01/06/2019   Other constipation 12/07/2018   Right ovarian epithelial cancer ([place]) 12/06/2018   Large pleural effusion 12/06/2018   PCP:  Caryl Bis, MD Pharmacy:   [place], [place] [address] Phone: [phone] Fax: [phone]     Social Determinants of Health (SDOH) Interventions    Readmission Risk Interventions    03/12/2022    8:58 AM 03/02/2022   12:08 PM 01/30/2022   10:53 AM  Readmission Risk Prevention Plan  Transportation Screening Complete Complete Complete  Medication Review (RN Care Manager) Complete Complete Complete  PCP or Specialist appointment within 3-5 days of discharge Complete Complete Complete  HRI or Home  Care Consult Complete Complete Complete  SW Recovery Care/Counseling Consult Complete Complete Complete  Palliative Care Screening Complete Complete Complete  [place] Not Applicable Not Applicable Not Applicable

## 2022-03-13 DIAGNOSIS — C561 Malignant neoplasm of right ovary: Secondary | ICD-10-CM | POA: Diagnosis not present

## 2022-03-13 DIAGNOSIS — R52 Pain, unspecified: Secondary | ICD-10-CM

## 2022-03-13 DIAGNOSIS — Z7189 Other specified counseling: Secondary | ICD-10-CM | POA: Diagnosis not present

## 2022-03-13 DIAGNOSIS — K56609 Unspecified intestinal obstruction, unspecified as to partial versus complete obstruction: Secondary | ICD-10-CM | POA: Diagnosis not present

## 2022-03-13 LAB — CBC
HCT: 33.7 % — ABNORMAL LOW (ref 36.0–46.0)
Hemoglobin: 11.2 g/dL — ABNORMAL LOW (ref 12.0–15.0)
MCH: 33.6 pg (ref 26.0–34.0)
MCHC: 33.2 g/dL (ref 30.0–36.0)
MCV: 101.2 fL — ABNORMAL HIGH (ref 80.0–100.0)
Platelets: 114 10*3/uL — ABNORMAL LOW (ref 150–400)
RBC: 3.33 MIL/uL — ABNORMAL LOW (ref 3.87–5.11)
RDW: 14.6 % (ref 11.5–15.5)
WBC: 3.6 10*3/uL — ABNORMAL LOW (ref 4.0–10.5)
nRBC: 0 % (ref 0.0–0.2)

## 2022-03-13 LAB — BASIC METABOLIC PANEL
Anion gap: 7 (ref 5–15)
BUN: 18 mg/dL (ref 8–23)
CO2: 27 mmol/L (ref 22–32)
Calcium: 9 mg/dL (ref 8.9–10.3)
Chloride: 98 mmol/L (ref 98–111)
Creatinine, Ser: 0.84 mg/dL (ref 0.44–1.00)
GFR, Estimated: >60 mL/min (ref >60)
Glucose, Bld: 148 mg/dL — ABNORMAL HIGH (ref 70–99)
Potassium: 3.9 mmol/L (ref 3.5–5.1)
Sodium: 132 mmol/L — ABNORMAL LOW (ref 135–145)

## 2022-03-13 LAB — MAGNESIUM: Magnesium: 2 mg/dL (ref 1.7–2.4)

## 2022-03-13 LAB — PHOSPHORUS: Phosphorus: 3.4 mg/dL (ref 2.5–4.6)

## 2022-03-13 MED ORDER — FENTANYL 12 MCG/HR TD PT72
1.0000 | MEDICATED_PATCH | TRANSDERMAL | Status: DC
  Administered 2022-03-13 – 2022-03-16 (×2): 1 via TRANSDERMAL
  Filled 2022-03-13 (×2): qty 1

## 2022-03-13 MED ORDER — KETOROLAC TROMETHAMINE 30 MG/ML IJ SOLN
30.0000 mg | Freq: Four times a day (QID) | INTRAMUSCULAR | Status: DC | PRN
  Filled 2022-03-13: qty 1

## 2022-03-13 MED ORDER — HYDROMORPHONE HCL 1 MG/ML IJ SOLN
1.0000 mg | INTRAMUSCULAR | Status: DC
  Administered 2022-03-13 – 2022-03-14 (×3): 1 mg via INTRAVENOUS
  Filled 2022-03-13 (×4): qty 1

## 2022-03-13 MED ORDER — BISACODYL 5 MG PO TBEC
5.0000 mg | DELAYED_RELEASE_TABLET | Freq: Every day | ORAL | Status: DC
  Administered 2022-03-13 – 2022-03-17 (×5): 5 mg via ORAL
  Filled 2022-03-13 (×6): qty 1

## 2022-03-13 MED ORDER — OXYCODONE HCL 5 MG PO TABS
5.0000 mg | ORAL_TABLET | ORAL | Status: DC
  Administered 2022-03-13 – 2022-03-17 (×28): 5 mg via ORAL
  Filled 2022-03-13 (×29): qty 1

## 2022-03-13 MED ORDER — FENTANYL 25 MCG/HR TD PT72
1.0000 | MEDICATED_PATCH | TRANSDERMAL | Status: DC
  Administered 2022-03-13 – 2022-03-16 (×2): 1 via TRANSDERMAL
  Filled 2022-03-13 (×2): qty 1

## 2022-03-13 NOTE — Progress Notes (Signed)
PROGRESS NOTE    Paula Peterson  SVX:[id number] DOB: Aug 13, 1940 DOA: 03/08/2022 PCP: Caryl Bis, MD    Brief Narrative:  This 81 years old female with PMH significant for DVT and PE on Eliquis, hypertension, ovarian cancer with peritoneal carcinomatosis and recurrent admissions for small bowel obstruction related to her malignancy now presented in the ED with abdominal pain,  nausea and vomiting.  Patient describes pain is similar to the prior experiences with SBO but is more severe. CT abdomen and pelvis demonstrates slight worsening of small bowel obstruction.  Patient is admitted for further evaluation. General surgery is consulted. Patient is started on IV hydration, bowel rest, adequate pain control.  Patient is under home hospice care. Patient underwent Pleurix catheter for recurrent pleural effusion.  General surgery recommended for venting G-tube for comfort but considered not an ideal candidate. Palliative care is meeting family to discuss goals of care.   Assessment & Plan:   Principal Problem:   SBO (small bowel obstruction) (HCC) Active Problems:   Right ovarian epithelial cancer (HCC)   PE (pulmonary thromboembolism) (HCC)   Recurrent UTI   Thrombocytopenia (HCC)   Recurrent right pleural effusion   Acute cystitis with hematuria  Small bowel obstruction: Patient has had recurrent partial small bowel obstruction due to her malignancy. She presented with recurrent abdominal pain, nausea, vomiting and describes it more severe than previous episodes. Continue IV hydration,  bowel rest, pain control and antiemetics. Patient refuses NG tube. Continue conservative management and bowel rest. General surgery is consulted, potential palliative option for her recurring SBO would be venting gastrostomy tube. She is considered not a candidate for ven G tube as per IR Continue bowel rest, IV hydration.  UTI: Completed ceftriaxone for 4 days. Urine culture grew E. coli sensitive  to ceftriaxone.  Recurrent right pleural effusion: IR is consulted.  Patient underwent Pleurix catheter for symptomatic relief. Repeat chest x-ray shows catheter in place.  No evidence of pneumothorax or pleural effusion  History of DVT and PE Resumed Eliquis.  Right ovarian cancer: Multiple prolonged discussion has happened between patient and the oncologist about goals of care.  She is now currently enrolled in home based hospice program.  Her condition is noncurable, her prognosis is poor and with recurrent hospitalization is not possible that she can continue home hospice. Palliative care consulted to discuss goals of care.   DVT prophylaxis: SCDS Code Status: DNR Family Communication: Daughter at bed side. Disposition Plan:   Status is: Inpatient Remains inpatient appropriate because: Admitted for small bowel obstruction secondary to malignancy.  General surgery consulted,  patient needs conservative management, oncology is consulted,  palliative care consulted to discuss goals of care.   Consultants:  General surgery Oncology Palliative care  Procedures: CT A/P  Antimicrobials:  Anti-infectives (From admission, onward)    Start     Dose/Rate Route Frequency Ordered Stop   03/10/22 0300  cefTRIAXone (ROCEPHIN) 1 g in sodium chloride 0.9 % 100 mL IVPB  Status:  Discontinued        1 g 200 mL/hr over 30 Minutes Intravenous Every 24 hours 03/09/22 0312 03/12/22 0832   03/09/22 0300  cefTRIAXone (ROCEPHIN) 1 g in sodium chloride 0.9 % 100 mL IVPB        1 g 200 mL/hr over 30 Minutes Intravenous  Once 03/09/22 0250 03/09/22 0457       Subjective: Patient was seen and examined at bedside.  Overnight events noted.  Patient reports feeling better,  states pain around the Pleurx catheter has resolved.   She denies any nausea and vomiting.  She appears comfortable.  Objective: Vitals:   03/12/22 0500 03/12/22 1449 03/12/22 2015 03/13/22 0520  BP:  126/63 (!) 156/76 133/65   Pulse:  75 (!) 103 65  Resp:  '19 20 16  '$ Temp:  97.9 F (36.6 C) 97.7 F (36.5 C) (!) 97.5 F (36.4 C)  TempSrc:  Oral Oral Oral  SpO2:  90% 94% 91%  Weight: 96 kg     Height:        Intake/Output Summary (Last 24 hours) at 03/13/2022 1308 Last data filed at 03/13/2022 0900 Gross per 24 hour  Intake 800 ml  Output --  Net 800 ml   Filed Weights   03/10/22 0608 03/11/22 0516 03/12/22 0500  Weight: 89.5 kg 94.1 kg 96 kg    Examination:  General exam: Appears deconditioned, comfortable, not in any acute distress. Respiratory system: CTA bilaterally, respiratory effort normal, RR 17 Cardiovascular system: S1 & S2 heard, regular rate and rhythm, no murmur. Gastrointestinal system: Abdomen is soft, nondistended, mildly tender, BS: Sluggish Central nervous system: Alert and oriented X 3. No focal neurological deficits. Extremities: No edema, no cyanosis, no clubbing Skin: No rashes, lesions or ulcers Psychiatry: Judgement and insight appear normal. Mood & affect appropriate.     Data Reviewed: I have personally reviewed following labs and imaging studies  CBC: Recent Labs  Lab 03/08/22 2326 03/10/22 0445 03/11/22 0209 03/12/22 0500 03/13/22 0416  WBC 7.7 5.0 3.8* 3.8* 3.6*  NEUTROABS 5.6  --   --   --   --   HGB 12.8 11.0* 10.3* 10.7* 11.2*  HCT 38.9 34.1* 32.2* 33.2* 33.7*  MCV 104.3* 106.9* 106.6* 104.1* 101.2*  PLT 138* 111* 103* 108* 625*   Basic Metabolic Panel: Recent Labs  Lab 03/08/22 2326 03/10/22 0445 03/11/22 0209 03/12/22 0500 03/13/22 0416  NA 139 136 137 135 132*  K 3.7 3.6 3.2* 3.3* 3.9  CL 104 101 102 101 98  CO2 '26 26 27 25 27  '$ GLUCOSE 128* 123* 104* 104* 148*  BUN '18 21 17 13 18  '$ CREATININE 1.01* 1.11* 0.88 0.86 0.84  CALCIUM 10.0 9.1 8.8* 8.7* 9.0  MG  --  1.4* 1.7 1.5* 2.0  PHOS  --  3.4  --  2.0* 3.4   GFR: Estimated Creatinine Clearance: 65.7 mL/min (by C-G formula based on SCr of 0.84 mg/dL). Liver Function Tests: Recent Labs   Lab 03/08/22 2326  AST 22  ALT 22  ALKPHOS 63  BILITOT 0.5  PROT 6.8  ALBUMIN 3.4*   Recent Labs  Lab 03/08/22 2326  LIPASE 23   No results for input(s): "AMMONIA" in the last 168 hours. Coagulation Profile: Recent Labs  Lab 03/09/22 1315  INR 1.0   Cardiac Enzymes: No results for input(s): "CKTOTAL", "CKMB", "CKMBINDEX", "TROPONINI" in the last 168 hours. BNP (last 3 results) No results for input(s): "PROBNP" in the last 8760 hours. HbA1C: No results for input(s): "HGBA1C" in the last 72 hours. CBG: No results for input(s): "GLUCAP" in the last 168 hours. Lipid Profile: No results for input(s): "CHOL", "HDL", "LDLCALC", "TRIG", "CHOLHDL", "LDLDIRECT" in the last 72 hours. Thyroid Function Tests: No results for input(s): "TSH", "T4TOTAL", "FREET4", "T3FREE", "THYROIDAB" in the last 72 hours. Anemia Panel: No results for input(s): "VITAMINB12", "FOLATE", "FERRITIN", "TIBC", "IRON", "RETICCTPCT" in the last 72 hours. Sepsis Labs: No results for input(s): "PROCALCITON", "LATICACIDVEN" in the last 168 hours.  Recent Results (from the past 240 hour(s))  Urine Culture     Status: Abnormal   Collection Time: 03/09/22  2:50 AM   Specimen: Urine, Clean Catch  Result Value Ref Range Status   Specimen Description   Final    URINE, CLEAN CATCH Performed at [facility], [place] [address], [place], [place] [address]    Special Requests   Final    NONE Performed at [facility] [address] [place], [place] [address]    Culture >=100,000 COLONIES/mL ESCHERICHIA COLI (A)  Final   Report Status 03/11/2022 FINAL  Final   Organism ID, Bacteria ESCHERICHIA COLI (A)  Final      Susceptibility   Escherichia coli - MIC*    AMPICILLIN 4 SENSITIVE Sensitive     CEFAZOLIN <=4 SENSITIVE Sensitive     CEFEPIME <=0.12 SENSITIVE Sensitive     CEFTRIAXONE <=0.25 SENSITIVE Sensitive     CIPROFLOXACIN <=0.25 SENSITIVE Sensitive     GENTAMICIN <=1  SENSITIVE Sensitive     IMIPENEM <=0.25 SENSITIVE Sensitive     NITROFURANTOIN <=16 SENSITIVE Sensitive     TRIMETH/SULFA <=20 SENSITIVE Sensitive     AMPICILLIN/SULBACTAM <=2 SENSITIVE Sensitive     PIP/TAZO <=4 SENSITIVE Sensitive     * >=100,000 COLONIES/mL ESCHERICHIA COLI     Radiology Studies: DG Chest Port 1 View  Result Date: 03/12/2022 CLINICAL DATA:  Pneumothorax EXAM: PORTABLE CHEST 1 VIEW COMPARISON:  Chest radiograph 1 day prior FINDINGS: The right chest wall port and right chest tube are stable. The cardiomediastinal silhouette is stable. Right larger than left pleural effusions are again seen with bibasilar airspace opacities. The previously seen right pneumothorax is slightly decreased in size. There is no left pneumothorax The bones are stable. IMPRESSION: 1. Slightly decreased size of the small right apical pneumothorax with unchanged right chest tube. 2. Stable bilateral pleural effusions with adjacent airspace opacities. Electronically Signed   By: Valetta Mole M.D.   On: 03/12/2022 08:14    Scheduled Meds:  apixaban  5 mg Oral BID   Chlorhexidine Gluconate Cloth  6 each Topical Daily   dexamethasone (DECADRON) injection  4 mg Intravenous Q24H   fentaNYL  1 patch Transdermal Q72H    HYDROmorphone (DILAUDID) injection  1 mg Intravenous Q3H   Continuous Infusions:  promethazine (PHENERGAN) injection (IM or IVPB)       LOS: 4 days    Time spent: 35 mins    Scott Vanderveer, MD [facility]   If 7PM-7AM, please contact night-coverage

## 2022-03-13 NOTE — Progress Notes (Signed)
Palliative:  HPI: 81 y.o. female  with past medical history of ovarian cancer with peritoneal carcinomatosis with recurrent admission for SBO, DVT and PE on Eliquis, hypertension, stroke, GERD admitted on 03/08/2022 with abdominal pain with nausea/vomiting related to partial bowel obstruction, UTI, and recurrent right pleural effusion s/p PleurX drain 9/12.    I met today with Paula Peterson and daughter, Paula Peterson, at bedside. We discussed her status and she appears much improved today. She appears to have more energy, passing more gas today and feels she needs to have BM, pain improved, feeling hungry. We discussed plans for pain management, bowel regimen, and goal to return home with hospice. We discussed advancing diet and how to manage and titrate at home according to symptoms and tolerance. Discussed hospice support to management of pain and nausea/vomiting with bowel progression. They are hopeful to return home over the next few days. Plan and changes outlined below as discussed in detail with family at bedside. Granddaughter to bedside for part of conversation as well.   All questions/concerns addressed. Emotional support provided. Discussed with RN and Dr. Dwyane Dee.   Plan: - DNR.  - Pain:  Pleuritic pain s/p PleurX with some improvement: Toradol PRN. Chronic cancer pain: Increase fentanyl patch to 37 mcg/hr. Continue dilaudid 1 mg IV OR OxyIR 5 mg every 3 hours scheduled. Will make attempts to transition back to po. Hospice can continue to titrate pain medications at home as long as not needing IV meds.  - Bowel Regimen: Having much more flatus. Pain is improved. Tolerating clear liquids and wants to try full liquids which have been ordered. Dulcolax po ordered as patient/family report this has been beneficial previously. Monitor closely for N/V, abd distention to ensure no further progression of obstruction. Continue decadron and may transition back to po at time of discharge.  - Drain PleurX PRN when  having pain or respiratory symptoms. Do not want to drain prematurely given significant pleuritic pain after placement. Hospice to manage at home.  - Hopeful for return home with hospice support Saturday or Sunday if tolerating full liquids and po meds.   [place], NP Palliative Medicine Team Pager [phone] (Please see [email or web address] for schedule) Team Phone [phone]    Greater than 50%  of this time was spent counseling and coordinating care related to the above assessment and plan

## 2022-03-14 DIAGNOSIS — K56609 Unspecified intestinal obstruction, unspecified as to partial versus complete obstruction: Secondary | ICD-10-CM | POA: Diagnosis not present

## 2022-03-14 LAB — CBC
HCT: 33.9 % — ABNORMAL LOW (ref 36.0–46.0)
Hemoglobin: 11.4 g/dL — ABNORMAL LOW (ref 12.0–15.0)
MCH: 34 pg (ref 26.0–34.0)
MCHC: 33.6 g/dL (ref 30.0–36.0)
MCV: 101.2 fL — ABNORMAL HIGH (ref 80.0–100.0)
Platelets: 145 10*3/uL — ABNORMAL LOW (ref 150–400)
RBC: 3.35 MIL/uL — ABNORMAL LOW (ref 3.87–5.11)
RDW: 14.3 % (ref 11.5–15.5)
WBC: 5.1 10*3/uL (ref 4.0–10.5)
nRBC: 0 % (ref 0.0–0.2)

## 2022-03-14 LAB — BASIC METABOLIC PANEL
Anion gap: 7 (ref 5–15)
BUN: 21 mg/dL (ref 8–23)
CO2: 28 mmol/L (ref 22–32)
Calcium: 9.2 mg/dL (ref 8.9–10.3)
Chloride: 97 mmol/L — ABNORMAL LOW (ref 98–111)
Creatinine, Ser: 0.95 mg/dL (ref 0.44–1.00)
GFR, Estimated: >60 mL/min (ref >60)
Glucose, Bld: 144 mg/dL — ABNORMAL HIGH (ref 70–99)
Potassium: 4 mmol/L (ref 3.5–5.1)
Sodium: 132 mmol/L — ABNORMAL LOW (ref 135–145)

## 2022-03-14 NOTE — Progress Notes (Signed)
PROGRESS NOTE    Paula Peterson  ZOX:[id number] DOB: 02-08-1941 DOA: 03/08/2022 PCP: Caryl Bis, MD    Brief Narrative:  This 81 years old female with PMH significant for DVT and PE on Eliquis, hypertension, ovarian cancer with peritoneal carcinomatosis and recurrent admissions for small bowel obstruction related to her malignancy now presented in the ED with abdominal pain,  nausea and vomiting.  Patient describes pain is similar to the prior experiences with SBO but is more severe. CT abdomen and pelvis demonstrates slight worsening of small bowel obstruction.  Patient is admitted for further evaluation. General surgery is consulted. Patient is started on IV hydration, bowel rest, adequate pain control.  Patient is under home hospice care. Patient underwent Pleurix catheter for recurrent pleural effusion.  General surgery recommended for venting G-tube for comfort but considered not an ideal candidate. Palliative care is meeting family to discuss goals of care.   Assessment & Plan:   Principal Problem:   SBO (small bowel obstruction) (HCC) Active Problems:   Right ovarian epithelial cancer (HCC)   PE (pulmonary thromboembolism) (HCC)   Recurrent UTI   Thrombocytopenia (HCC)   Recurrent right pleural effusion   Acute cystitis with hematuria  Small bowel obstruction: Patient has had recurrent partial small bowel obstruction due to her malignancy. She presented with recurrent abdominal pain, nausea, vomiting and describes it more severe than previous episodes. Continue IV hydration,  bowel rest, pain control and antiemetics. Patient refuses NG tube. Continue conservative management and bowel rest. General surgery is consulted, potential palliative option for her recurring SBO would be venting gastrostomy tube. She is considered not a candidate for ven G tube as per IR Continue IV hydration.  She is able to pass flatus.  Started on full liquid diet.  UTI: Completed ceftriaxone  for 4 days. Urine culture grew E. coli sensitive to ceftriaxone.  Recurrent right pleural effusion: IR is consulted.  Patient underwent Pleurix catheter for symptomatic relief. Repeat chest x-ray shows catheter in place.  No evidence of pneumothorax or pleural effusion  History of DVT and PE Resumed Eliquis.  Right ovarian cancer: Multiple prolonged discussion has happened between patient and the oncologist about goals of care.  She is now currently enrolled in home based hospice program.  Her condition is noncurable, her prognosis is poor and with recurrent hospitalization is not possible that she can continue home hospice. Palliative care consulted to discuss goals of care.   DVT prophylaxis: SCDS Code Status: DNR Family Communication: Daughter at bed side. Disposition Plan:   Status is: Inpatient Remains inpatient appropriate because: Admitted for small bowel obstruction secondary to malignancy.  General surgery consulted,  patient needs conservative management, oncology is consulted,  palliative care consulted to discuss goals of care.  Anticipated discharge home in 1 to 2 days with home hospice.   Consultants:  General surgery Oncology Palliative care  Procedures: CT A/P  Antimicrobials:  Anti-infectives (From admission, onward)    Start     Dose/Rate Route Frequency Ordered Stop   03/10/22 0300  cefTRIAXone (ROCEPHIN) 1 g in sodium chloride 0.9 % 100 mL IVPB  Status:  Discontinued        1 g 200 mL/hr over 30 Minutes Intravenous Every 24 hours 03/09/22 0312 03/12/22 0832   03/09/22 0300  cefTRIAXone (ROCEPHIN) 1 g in sodium chloride 0.9 % 100 mL IVPB        1 g 200 mL/hr over 30 Minutes Intravenous  Once 03/09/22 0250 03/09/22 0457  Subjective: Patient was seen and examined at bedside.  Overnight events noted.  Patient reports feeling better.  Pain around the Pleurx catheter has resolved.  She is able to pass flatus, started on full liquid diet. She denies  any nausea and vomiting.  She appears comfortable.  Objective: Vitals:   03/13/22 2027 03/14/22 0500 03/14/22 0523 03/14/22 1333  BP: (!) 140/65  (!) 160/72 (!) 152/80  Pulse: 73  71 97  Resp: '16  18 18  '$ Temp: (!) 97.5 F (36.4 C)  97.7 F (36.5 C) 98 F (36.7 C)  TempSrc: Oral  Oral Oral  SpO2: 95%  93% 94%  Weight:  96 kg    Height:       No intake or output data in the 24 hours ending 03/14/22 1352  Filed Weights   03/11/22 0516 03/12/22 0500 03/14/22 0500  Weight: 94.1 kg 96 kg 96 kg    Examination:  General exam: Appears deconditioned, comfortable, not in any acute distress. Respiratory system: CTA bilaterally, respiratory effort normal, RR 15 Cardiovascular system: S1 & S2 heard, regular rate and rhythm, no murmur. Gastrointestinal system: Abdomen is soft, nondistended, mildly tender, BS :sluggish Central nervous system: Alert and oriented X 3. No focal neurological deficits. Extremities: No edema, no cyanosis, no clubbing Skin: No rashes, lesions or ulcers Psychiatry: Judgement and insight appear normal. Mood & affect appropriate.     Data Reviewed: I have personally reviewed following labs and imaging studies  CBC: Recent Labs  Lab 03/08/22 2326 03/10/22 0445 03/11/22 0209 03/12/22 0500 03/13/22 0416 03/14/22 0500  WBC 7.7 5.0 3.8* 3.8* 3.6* 5.1  NEUTROABS 5.6  --   --   --   --   --   HGB 12.8 11.0* 10.3* 10.7* 11.2* 11.4*  HCT 38.9 34.1* 32.2* 33.2* 33.7* 33.9*  MCV 104.3* 106.9* 106.6* 104.1* 101.2* 101.2*  PLT 138* 111* 103* 108* 114* 956*   Basic Metabolic Panel: Recent Labs  Lab 03/10/22 0445 03/11/22 0209 03/12/22 0500 03/13/22 0416 03/14/22 0500  NA 136 137 135 132* 132*  K 3.6 3.2* 3.3* 3.9 4.0  CL 101 102 101 98 97*  CO2 '26 27 25 27 28  '$ GLUCOSE 123* 104* 104* 148* 144*  BUN '21 17 13 18 21  '$ CREATININE 1.11* 0.88 0.86 0.84 0.95  CALCIUM 9.1 8.8* 8.7* 9.0 9.2  MG 1.4* 1.7 1.5* 2.0  --   PHOS 3.4  --  2.0* 3.4  --     GFR: Estimated Creatinine Clearance: 58.1 mL/min (by C-G formula based on SCr of 0.95 mg/dL). Liver Function Tests: Recent Labs  Lab 03/08/22 2326  AST 22  ALT 22  ALKPHOS 63  BILITOT 0.5  PROT 6.8  ALBUMIN 3.4*   Recent Labs  Lab 03/08/22 2326  LIPASE 23   No results for input(s): "AMMONIA" in the last 168 hours. Coagulation Profile: Recent Labs  Lab 03/09/22 1315  INR 1.0   Cardiac Enzymes: No results for input(s): "CKTOTAL", "CKMB", "CKMBINDEX", "TROPONINI" in the last 168 hours. BNP (last 3 results) No results for input(s): "PROBNP" in the last 8760 hours. HbA1C: No results for input(s): "HGBA1C" in the last 72 hours. CBG: No results for input(s): "GLUCAP" in the last 168 hours. Lipid Profile: No results for input(s): "CHOL", "HDL", "LDLCALC", "TRIG", "CHOLHDL", "LDLDIRECT" in the last 72 hours. Thyroid Function Tests: No results for input(s): "TSH", "T4TOTAL", "FREET4", "T3FREE", "THYROIDAB" in the last 72 hours. Anemia Panel: No results for input(s): "VITAMINB12", "FOLATE", "FERRITIN", "TIBC", "IRON", "  RETICCTPCT" in the last 72 hours. Sepsis Labs: No results for input(s): "PROCALCITON", "LATICACIDVEN" in the last 168 hours.  Recent Results (from the past 240 hour(s))  Urine Culture     Status: Abnormal   Collection Time: 03/09/22  2:50 AM   Specimen: Urine, Clean Catch  Result Value Ref Range Status   Specimen Description   Final    URINE, CLEAN CATCH Performed at [facility], [place] [address], [place], [place] [address]    Special Requests   Final    NONE Performed at [facility], [place] [address] [place], [place] [address]    Culture >=100,000 COLONIES/mL ESCHERICHIA COLI (A)  Final   Report Status 03/11/2022 FINAL  Final   Organism ID, Bacteria ESCHERICHIA COLI (A)  Final      Susceptibility   Escherichia coli - MIC*    AMPICILLIN 4 SENSITIVE Sensitive     CEFAZOLIN <=4 SENSITIVE Sensitive     CEFEPIME  <=0.12 SENSITIVE Sensitive     CEFTRIAXONE <=0.25 SENSITIVE Sensitive     CIPROFLOXACIN <=0.25 SENSITIVE Sensitive     GENTAMICIN <=1 SENSITIVE Sensitive     IMIPENEM <=0.25 SENSITIVE Sensitive     NITROFURANTOIN <=16 SENSITIVE Sensitive     TRIMETH/SULFA <=20 SENSITIVE Sensitive     AMPICILLIN/SULBACTAM <=2 SENSITIVE Sensitive     PIP/TAZO <=4 SENSITIVE Sensitive     * >=100,000 COLONIES/mL ESCHERICHIA COLI     Radiology Studies: No results found.  Scheduled Meds:  apixaban  5 mg Oral BID   bisacodyl  5 mg Oral Daily   Chlorhexidine Gluconate Cloth  6 each Topical Daily   dexamethasone (DECADRON) injection  4 mg Intravenous Q24H   fentaNYL  1 patch Transdermal Q72H   fentaNYL  1 patch Transdermal Q72H    HYDROmorphone (DILAUDID) injection  1 mg Intravenous Q3H   Or   oxyCODONE  5 mg Oral Q3H   Continuous Infusions:  promethazine (PHENERGAN) injection (IM or IVPB)       LOS: 5 days    Time spent: 35 mins    Seferino Oscar, MD [facility]   If 7PM-7AM, please contact night-coverage

## 2022-03-15 ENCOUNTER — Other Ambulatory Visit: Payer: Self-pay

## 2022-03-15 DIAGNOSIS — K56609 Unspecified intestinal obstruction, unspecified as to partial versus complete obstruction: Secondary | ICD-10-CM | POA: Diagnosis not present

## 2022-03-15 MED ORDER — PANTOPRAZOLE SODIUM 40 MG PO TBEC
40.0000 mg | DELAYED_RELEASE_TABLET | Freq: Every day | ORAL | Status: DC
  Administered 2022-03-15 – 2022-03-17 (×3): 40 mg via ORAL
  Filled 2022-03-15 (×4): qty 1

## 2022-03-15 NOTE — Progress Notes (Signed)
PROGRESS NOTE    Paula Peterson  ATF:[id number] DOB: 02-23-1941 DOA: 03/08/2022 PCP: Caryl Bis, MD    Brief Narrative:  This 81 years old female with PMH significant for DVT and PE on Eliquis, hypertension, ovarian cancer with peritoneal carcinomatosis and recurrent admissions for small bowel obstruction related to her malignancy now presented in the ED with abdominal pain,  nausea and vomiting.  Patient describes pain is similar to the prior experiences with SBO but is more severe. CT abdomen and pelvis demonstrates slight worsening of small bowel obstruction.  Patient is admitted for further evaluation. General surgery is consulted. Patient is started on IV hydration, bowel rest, adequate pain control.  Patient is under home hospice care. Patient underwent Pleurix catheter for recurrent pleural effusion.  General surgery recommended for venting G-tube for comfort but considered not an ideal candidate. Palliative care is meeting family to discuss goals of care.   Assessment & Plan:   Principal Problem:   SBO (small bowel obstruction) (HCC) Active Problems:   Right ovarian epithelial cancer (HCC)   PE (pulmonary thromboembolism) (HCC)   Recurrent UTI   Thrombocytopenia (HCC)   Recurrent right pleural effusion   Acute cystitis with hematuria  Small bowel obstruction: Patient has had recurrent partial small bowel obstruction due to her malignancy. She presented with recurrent abdominal pain, nausea, vomiting and describes it more severe than previous episodes. Continue IV hydration,  bowel rest, pain control and antiemetics. Patient refuses NG tube. Continue conservative management and bowel rest. General surgery is consulted, potential palliative option for her recurring SBO would be venting gastrostomy tube. She is considered not a candidate for vent G tube as per IR Continue IV hydration.  She is able to pass flatus.  Started on full liquid diet, advanced to soft  bland  UTI: Completed ceftriaxone for 4 days. Urine culture grew E. coli sensitive to ceftriaxone.  Recurrent right pleural effusion: IR is consulted.  Patient underwent Pleurix catheter for symptomatic relief. Repeat chest x-ray shows catheter in place.  No evidence of pneumothorax or pleural effusion  History of DVT and PE Resumed Eliquis.  Right ovarian cancer: Multiple prolonged discussion has happened between patient and the oncologist about goals of care.  She is now currently enrolled in home based hospice program.  Her condition is noncurable, her prognosis is poor and with recurrent hospitalization is not possible that she can continue home hospice. Palliative care consulted to discuss goals of care.   DVT prophylaxis: SCDS Code Status: DNR Family Communication: Daughter at bed side. Disposition Plan:   Status is: Inpatient Remains inpatient appropriate because: Admitted for small bowel obstruction secondary to malignancy.  General surgery consulted,  patient needs conservative management, oncology is consulted,  palliative care consulted to discuss goals of care.  Anticipated discharge home 9/18 with home hospice.   Consultants:  General surgery Oncology Palliative care  Procedures: CT A/P  Antimicrobials:  Anti-infectives (From admission, onward)    Start     Dose/Rate Route Frequency Ordered Stop   03/10/22 0300  cefTRIAXone (ROCEPHIN) 1 g in sodium chloride 0.9 % 100 mL IVPB  Status:  Discontinued        1 g 200 mL/hr over 30 Minutes Intravenous Every 24 hours 03/09/22 0312 03/12/22 0832   03/09/22 0300  cefTRIAXone (ROCEPHIN) 1 g in sodium chloride 0.9 % 100 mL IVPB        1 g 200 mL/hr over 30 Minutes Intravenous  Once 03/09/22 0250 03/09/22 0457  Subjective: Patient was seen and examined at bedside.  Overnight events noted.  Patient reports feeling much better.  Pain around the Pleurx catheter has completely resolved. She is able to pass flatus  but has not had a bowel movement yet. She is started on full liquid diet , tolerated well.  Wants to advance the diet to soft. She denies nausea,  vomiting and appears comfortable.   Objective: Vitals:   03/14/22 1333 03/14/22 1928 03/15/22 0445 03/15/22 0500  BP: (!) 152/80 (!) 144/62 (!) 109/95   Pulse: 97 70 82   Resp: '18 16 18   '$ Temp: 98 F (36.7 C) 97.7 F (36.5 C) (!) 97.4 F (36.3 C)   TempSrc: Oral     SpO2: 94% 95% 93%   Weight:    90.3 kg  Height:        Intake/Output Summary (Last 24 hours) at 03/15/2022 1336 Last data filed at 03/14/2022 1500 Gross per 24 hour  Intake --  Output 100 ml  Net -100 ml    Filed Weights   03/12/22 0500 03/14/22 0500 03/15/22 0500  Weight: 96 kg 96 kg 90.3 kg    Examination:  General exam: Appears deconditioned, comfortable, not in any acute distress. Respiratory system: CTA bilaterally, respiratory effort normal, RR 15 Cardiovascular system: S1-S2 heard, regular rate and rhythm, no murmur. Gastrointestinal system: Abdomen is soft, non distended, mildly sore, bowel sounds sluggish Central nervous system: Alert and oriented X 3. No focal neurological deficits. Extremities: No edema, no cyanosis, no clubbing Skin: No rashes, lesions or ulcers Psychiatry: Judgement and insight appear normal. Mood & affect appropriate.     Data Reviewed: I have personally reviewed following labs and imaging studies  CBC: Recent Labs  Lab 03/08/22 2326 03/10/22 0445 03/11/22 0209 03/12/22 0500 03/13/22 0416 03/14/22 0500  WBC 7.7 5.0 3.8* 3.8* 3.6* 5.1  NEUTROABS 5.6  --   --   --   --   --   HGB 12.8 11.0* 10.3* 10.7* 11.2* 11.4*  HCT 38.9 34.1* 32.2* 33.2* 33.7* 33.9*  MCV 104.3* 106.9* 106.6* 104.1* 101.2* 101.2*  PLT 138* 111* 103* 108* 114* 476*   Basic Metabolic Panel: Recent Labs  Lab 03/10/22 0445 03/11/22 0209 03/12/22 0500 03/13/22 0416 03/14/22 0500  NA 136 137 135 132* 132*  K 3.6 3.2* 3.3* 3.9 4.0  CL 101 102 101  98 97*  CO2 '26 27 25 27 28  '$ GLUCOSE 123* 104* 104* 148* 144*  BUN '21 17 13 18 21  '$ CREATININE 1.11* 0.88 0.86 0.84 0.95  CALCIUM 9.1 8.8* 8.7* 9.0 9.2  MG 1.4* 1.7 1.5* 2.0  --   PHOS 3.4  --  2.0* 3.4  --    GFR: Estimated Creatinine Clearance: 56.3 mL/min (by C-G formula based on SCr of 0.95 mg/dL). Liver Function Tests: Recent Labs  Lab 03/08/22 2326  AST 22  ALT 22  ALKPHOS 63  BILITOT 0.5  PROT 6.8  ALBUMIN 3.4*   Recent Labs  Lab 03/08/22 2326  LIPASE 23   No results for input(s): "AMMONIA" in the last 168 hours. Coagulation Profile: Recent Labs  Lab 03/09/22 1315  INR 1.0   Cardiac Enzymes: No results for input(s): "CKTOTAL", "CKMB", "CKMBINDEX", "TROPONINI" in the last 168 hours. BNP (last 3 results) No results for input(s): "PROBNP" in the last 8760 hours. HbA1C: No results for input(s): "HGBA1C" in the last 72 hours. CBG: No results for input(s): "GLUCAP" in the last 168 hours. Lipid Profile: No results  for input(s): "CHOL", "HDL", "LDLCALC", "TRIG", "CHOLHDL", "LDLDIRECT" in the last 72 hours. Thyroid Function Tests: No results for input(s): "TSH", "T4TOTAL", "FREET4", "T3FREE", "THYROIDAB" in the last 72 hours. Anemia Panel: No results for input(s): "VITAMINB12", "FOLATE", "FERRITIN", "TIBC", "IRON", "RETICCTPCT" in the last 72 hours. Sepsis Labs: No results for input(s): "PROCALCITON", "LATICACIDVEN" in the last 168 hours.  Recent Results (from the past 240 hour(s))  Urine Culture     Status: Abnormal   Collection Time: 03/09/22  2:50 AM   Specimen: Urine, Clean Catch  Result Value Ref Range Status   Specimen Description   Final    URINE, CLEAN CATCH Performed at [facility], [place] [address] [place], [place] [address]    Special Requests   Final    NONE Performed at [facility], [place] [address], [place],  [address]    Culture >=100,000 COLONIES/mL ESCHERICHIA COLI (A)  Final   Report Status  03/11/2022 FINAL  Final   Organism ID, Bacteria ESCHERICHIA COLI (A)  Final      Susceptibility   Escherichia coli - MIC*    AMPICILLIN 4 SENSITIVE Sensitive     CEFAZOLIN <=4 SENSITIVE Sensitive     CEFEPIME <=0.12 SENSITIVE Sensitive     CEFTRIAXONE <=0.25 SENSITIVE Sensitive     CIPROFLOXACIN <=0.25 SENSITIVE Sensitive     GENTAMICIN <=1 SENSITIVE Sensitive     IMIPENEM <=0.25 SENSITIVE Sensitive     NITROFURANTOIN <=16 SENSITIVE Sensitive     TRIMETH/SULFA <=20 SENSITIVE Sensitive     AMPICILLIN/SULBACTAM <=2 SENSITIVE Sensitive     PIP/TAZO <=4 SENSITIVE Sensitive     * >=100,000 COLONIES/mL ESCHERICHIA COLI     Radiology Studies: No results found.  Scheduled Meds:  apixaban  5 mg Oral BID   bisacodyl  5 mg Oral Daily   Chlorhexidine Gluconate Cloth  6 each Topical Daily   dexamethasone (DECADRON) injection  4 mg Intravenous Q24H   fentaNYL  1 patch Transdermal Q72H   fentaNYL  1 patch Transdermal Q72H    HYDROmorphone (DILAUDID) injection  1 mg Intravenous Q3H   Or   oxyCODONE  5 mg Oral Q3H   Continuous Infusions:  promethazine (PHENERGAN) injection (IM or IVPB)       LOS: 6 days    Time spent: 35 mins    Alyzabeth Pontillo, MD [facility]   If 7PM-7AM, please contact night-coverage

## 2022-03-16 ENCOUNTER — Inpatient Hospital Stay (HOSPITAL_COMMUNITY)

## 2022-03-16 DIAGNOSIS — K56609 Unspecified intestinal obstruction, unspecified as to partial versus complete obstruction: Secondary | ICD-10-CM | POA: Diagnosis not present

## 2022-03-16 MED ORDER — HEPARIN SOD (PORK) LOCK FLUSH 100 UNIT/ML IV SOLN
500.0000 [IU] | INTRAVENOUS | Status: AC | PRN
  Administered 2022-03-16: 500 [IU]
  Filled 2022-03-16: qty 5

## 2022-03-16 NOTE — Progress Notes (Signed)
PROGRESS NOTE    Paula Peterson  FGH:[id number] DOB: 1940-07-11 DOA: 03/08/2022 PCP: Caryl Bis, MD    Brief Narrative:  This 81 years old female with PMH significant for DVT and PE on Eliquis, hypertension, ovarian cancer with peritoneal carcinomatosis and recurrent admissions for small bowel obstruction related to her malignancy now presented in the ED with abdominal pain,  nausea and vomiting.  Patient describes pain is similar to the prior experiences with SBO but is more severe. CT abdomen and pelvis demonstrates slight worsening of small bowel obstruction.  Patient is admitted for further evaluation. General surgery is consulted. Patient is started on IV hydration, bowel rest, adequate pain control.  Patient is under home hospice care. Patient underwent Pleurix catheter for recurrent pleural effusion.  General surgery recommended for venting G-tube for comfort but considered not an ideal candidate. Palliative care is meeting family to discuss goals of care.  Assessment & Plan:   Principal Problem:   SBO (small bowel obstruction) (HCC) Active Problems:   Right ovarian epithelial cancer (HCC)   PE (pulmonary thromboembolism) (HCC)   Recurrent UTI   Thrombocytopenia (HCC)   Recurrent right pleural effusion   Acute cystitis with hematuria  Small bowel obstruction: Patient has had recurrent partial small bowel obstruction due to her malignancy. She presented with recurrent abdominal pain, nausea, vomiting and describes it more severe than previous episodes. Continue IV hydration,  bowel rest, pain control and antiemetics. Patient refuses NG tube. Continue conservative management and bowel rest. General surgery is consulted, potential palliative option for her recurring SBO would be venting gastrostomy tube. She is considered not a candidate for vent G tube as per IR Continue IV hydration.  She is able to pass flatus.  Started on full liquid diet, advanced to Dysphagia III  diet.  UTI: Completed ceftriaxone for 5 days. Urine culture grew E. coli sensitive to ceftriaxone.  Recurrent right pleural effusion: IR is consulted.  Patient underwent Pleurix catheter for symptomatic relief. Repeat chest x-ray shows catheter in place.  No evidence of pneumothorax or pleural effusion. Chest x-ray shows Stable moderate-sized right pleural effusion with adjacent atelectasis and right basilar pleural drainage catheter in place.  History of DVT and PE Resumed Eliquis.  Right ovarian cancer: Multiple prolonged discussion has happened between patient and the oncologist about goals of care.  She is now currently enrolled in home based hospice program.  Her condition is noncurable, her prognosis is poor and with recurrent hospitalization is not possible that she can continue home hospice. Palliative care consulted to discuss goals of care.   DVT prophylaxis: SCDS Code Status: DNR Family Communication: Daughter at bed side. Disposition Plan:   Status is: Inpatient Remains inpatient appropriate because: Admitted for small bowel obstruction secondary to malignancy.  General surgery consulted,  patient needs conservative management, oncology is consulted,  palliative care consulted to discuss goals of care.  Anticipated discharge home 9/19 with home hospice.   Consultants:  General surgery Oncology Palliative care  Procedures: CT A/P  Antimicrobials:  Anti-infectives (From admission, onward)    Start     Dose/Rate Route Frequency Ordered Stop   03/10/22 0300  cefTRIAXone (ROCEPHIN) 1 g in sodium chloride 0.9 % 100 mL IVPB  Status:  Discontinued        1 g 200 mL/hr over 30 Minutes Intravenous Every 24 hours 03/09/22 0312 03/12/22 0832   03/09/22 0300  cefTRIAXone (ROCEPHIN) 1 g in sodium chloride 0.9 % 100 mL IVPB  1 g 200 mL/hr over 30 Minutes Intravenous  Once 03/09/22 0250 03/09/22 0457       Subjective: Patient was seen and examined at bedside.   Overnight events noted.  Patient reports having pain around the Pleurx catheter. She is able to pass flatus and has not had a bowel movement yet. She tolerated full liquid diet,  advanced to dysphagia 3 mechanical diet She denies nausea,  vomiting and appears comfortable.   Objective: Vitals:   03/15/22 1354 03/15/22 2006 03/16/22 0500 03/16/22 0612  BP: 132/75 (!) 145/68  (!) 142/85  Pulse: 76 67  74  Resp: '16 19  19  '$ Temp: 97.8 F (36.6 C) 98.3 F (36.8 C)  98.3 F (36.8 C)  TempSrc: Oral     SpO2: 94% 97%  92%  Weight:   90.3 kg   Height:       No intake or output data in the 24 hours ending 03/16/22 1417   Filed Weights   03/14/22 0500 03/15/22 0500 03/16/22 0500  Weight: 96 kg 90.3 kg 90.3 kg    Examination:  General exam: Appears deconditioned, not in any acute distress.  Comfortable Respiratory system: CTA bilaterally, respiratory effort normal, RR 15 Cardiovascular system: S1-S2 heard, regular rate and rhythm, no murmur. Gastrointestinal system: Abdomen is soft, non distended, non tender, BS+ Central nervous system: Alert and oriented X 3. No focal neurological deficits. Extremities: No edema, no cyanosis, no clubbing Skin: No rashes, lesions or ulcers Psychiatry: Judgement and insight appear normal. Mood & affect appropriate.     Data Reviewed: I have personally reviewed following labs and imaging studies  CBC: Recent Labs  Lab 03/10/22 0445 03/11/22 0209 03/12/22 0500 03/13/22 0416 03/14/22 0500  WBC 5.0 3.8* 3.8* 3.6* 5.1  HGB 11.0* 10.3* 10.7* 11.2* 11.4*  HCT 34.1* 32.2* 33.2* 33.7* 33.9*  MCV 106.9* 106.6* 104.1* 101.2* 101.2*  PLT 111* 103* 108* 114* 371*   Basic Metabolic Panel: Recent Labs  Lab 03/10/22 0445 03/11/22 0209 03/12/22 0500 03/13/22 0416 03/14/22 0500  NA 136 137 135 132* 132*  K 3.6 3.2* 3.3* 3.9 4.0  CL 101 102 101 98 97*  CO2 '26 27 25 27 28  '$ GLUCOSE 123* 104* 104* 148* 144*  BUN '21 17 13 18 21  '$ CREATININE 1.11*  0.88 0.86 0.84 0.95  CALCIUM 9.1 8.8* 8.7* 9.0 9.2  MG 1.4* 1.7 1.5* 2.0  --   PHOS 3.4  --  2.0* 3.4  --    GFR: Estimated Creatinine Clearance: 56.3 mL/min (by C-G formula based on SCr of 0.95 mg/dL). Liver Function Tests: No results for input(s): "AST", "ALT", "ALKPHOS", "BILITOT", "PROT", "ALBUMIN" in the last 168 hours.  No results for input(s): "LIPASE", "AMYLASE" in the last 168 hours.  No results for input(s): "AMMONIA" in the last 168 hours. Coagulation Profile: No results for input(s): "INR", "PROTIME" in the last 168 hours.  Cardiac Enzymes: No results for input(s): "CKTOTAL", "CKMB", "CKMBINDEX", "TROPONINI" in the last 168 hours. BNP (last 3 results) No results for input(s): "PROBNP" in the last 8760 hours. HbA1C: No results for input(s): "HGBA1C" in the last 72 hours. CBG: No results for input(s): "GLUCAP" in the last 168 hours. Lipid Profile: No results for input(s): "CHOL", "HDL", "LDLCALC", "TRIG", "CHOLHDL", "LDLDIRECT" in the last 72 hours. Thyroid Function Tests: No results for input(s): "TSH", "T4TOTAL", "FREET4", "T3FREE", "THYROIDAB" in the last 72 hours. Anemia Panel: No results for input(s): "VITAMINB12", "FOLATE", "FERRITIN", "TIBC", "IRON", "RETICCTPCT" in the last 72 hours.  Sepsis Labs: No results for input(s): "PROCALCITON", "LATICACIDVEN" in the last 168 hours.  Recent Results (from the past 240 hour(s))  Urine Culture     Status: Abnormal   Collection Time: 03/09/22  2:50 AM   Specimen: Urine, Clean Catch  Result Value Ref Range Status   Specimen Description   Final    URINE, CLEAN CATCH Performed at [facility], [place] [address], [place], [place] [address]    Special Requests   Final    NONE Performed at [facility], [place] [address] [place], [place] [address]    Culture >=100,000 COLONIES/mL ESCHERICHIA COLI (A)  Final   Report Status 03/11/2022 FINAL  Final   Organism ID, Bacteria ESCHERICHIA COLI  (A)  Final      Susceptibility   Escherichia coli - MIC*    AMPICILLIN 4 SENSITIVE Sensitive     CEFAZOLIN <=4 SENSITIVE Sensitive     CEFEPIME <=0.12 SENSITIVE Sensitive     CEFTRIAXONE <=0.25 SENSITIVE Sensitive     CIPROFLOXACIN <=0.25 SENSITIVE Sensitive     GENTAMICIN <=1 SENSITIVE Sensitive     IMIPENEM <=0.25 SENSITIVE Sensitive     NITROFURANTOIN <=16 SENSITIVE Sensitive     TRIMETH/SULFA <=20 SENSITIVE Sensitive     AMPICILLIN/SULBACTAM <=2 SENSITIVE Sensitive     PIP/TAZO <=4 SENSITIVE Sensitive     * >=100,000 COLONIES/mL ESCHERICHIA COLI     Radiology Studies: DG CHEST PORT 1 VIEW  Result Date: 03/16/2022 CLINICAL DATA:  History of hypertension, PE, and stroke EXAM: PORTABLE CHEST 1 VIEW COMPARISON:  Radiograph 03/12/2022 FINDINGS: Chest port catheter tip overlies the distal superior vena cava. Unchanged cardiomediastinal silhouette. There is a moderate size right pleural effusion with adjacent atelectasis and basilar pleural drainage catheter in place, as seen on recent exam and not significantly changed. There is no evidence of pneumothorax. Left lung is clear. Unchanged left hemidiaphragm eventration. Bones are unchanged. IMPRESSION: Stable moderate-sized right pleural effusion with adjacent atelectasis and right basilar pleural drainage catheter in place. No evidence of pneumothorax. Electronically Signed   By: Maurine Simmering M.D.   On: 03/16/2022 11:05    Scheduled Meds:  apixaban  5 mg Oral BID   bisacodyl  5 mg Oral Daily   Chlorhexidine Gluconate Cloth  6 each Topical Daily   dexamethasone (DECADRON) injection  4 mg Intravenous Q24H   fentaNYL  1 patch Transdermal Q72H   fentaNYL  1 patch Transdermal Q72H    HYDROmorphone (DILAUDID) injection  1 mg Intravenous Q3H   Or   oxyCODONE  5 mg Oral Q3H   pantoprazole  40 mg Oral Daily   Continuous Infusions:  promethazine (PHENERGAN) injection (IM or IVPB)       LOS: 7 days    Time spent: 35 mins    Paydon Carll, MD [facility]   If 7PM-7AM, please contact night-coverage

## 2022-03-16 NOTE — Progress Notes (Signed)
Mobility Specialist - Progress Note   03/16/22 1042  Mobility  Activity Ambulated with assistance in hallway  Level of Assistance Standby assist, set-up cues, supervision of patient - no hands on  Assistive Device Front wheel walker  Distance Ambulated (ft) 60 ft  Activity Response Tolerated well  $Mobility charge 1 Mobility   Pt received in bed and agreed for mobility, pain in incision area, especially when transferring from sit to stand and sit to lying down. Pt returned to bed with all needs met and Xray in room.   Roderick Pee Mobility Specialist

## 2022-03-17 DIAGNOSIS — K56609 Unspecified intestinal obstruction, unspecified as to partial versus complete obstruction: Secondary | ICD-10-CM | POA: Diagnosis not present

## 2022-03-17 LAB — PHOSPHORUS: Phosphorus: 3.3 mg/dL (ref 2.5–4.6)

## 2022-03-17 LAB — BASIC METABOLIC PANEL
Anion gap: 10 (ref 5–15)
BUN: 20 mg/dL (ref 8–23)
CO2: 26 mmol/L (ref 22–32)
Calcium: 9.5 mg/dL (ref 8.9–10.3)
Chloride: 100 mmol/L (ref 98–111)
Creatinine, Ser: 0.97 mg/dL (ref 0.44–1.00)
GFR, Estimated: 60 mL/min — ABNORMAL LOW (ref >60)
Glucose, Bld: 115 mg/dL — ABNORMAL HIGH (ref 70–99)
Potassium: 4.4 mmol/L (ref 3.5–5.1)
Sodium: 136 mmol/L (ref 135–145)

## 2022-03-17 LAB — CBC
HCT: 37.2 % (ref 36.0–46.0)
Hemoglobin: 11.8 g/dL — ABNORMAL LOW (ref 12.0–15.0)
MCH: 33.5 pg (ref 26.0–34.0)
MCHC: 31.7 g/dL (ref 30.0–36.0)
MCV: 105.7 fL — ABNORMAL HIGH (ref 80.0–100.0)
Platelets: 155 10*3/uL (ref 150–400)
RBC: 3.52 MIL/uL — ABNORMAL LOW (ref 3.87–5.11)
RDW: 14.7 % (ref 11.5–15.5)
WBC: 4.6 10*3/uL (ref 4.0–10.5)
nRBC: 1.3 % — ABNORMAL HIGH (ref 0.0–0.2)

## 2022-03-17 LAB — MAGNESIUM: Magnesium: 1.6 mg/dL — ABNORMAL LOW (ref 1.7–2.4)

## 2022-03-17 MED ORDER — HEPARIN SOD (PORK) LOCK FLUSH 100 UNIT/ML IV SOLN
500.0000 [IU] | INTRAVENOUS | Status: AC | PRN
  Administered 2022-03-17: 500 [IU]
  Filled 2022-03-17: qty 5

## 2022-03-17 MED ORDER — SODIUM CHLORIDE 0.9% FLUSH
10.0000 mL | Freq: Two times a day (BID) | INTRAVENOUS | Status: DC
  Administered 2022-03-17: 10 mL

## 2022-03-17 MED ORDER — MAGNESIUM SULFATE 2 GM/50ML IV SOLN
2.0000 g | Freq: Once | INTRAVENOUS | Status: AC
  Administered 2022-03-17: 2 g via INTRAVENOUS
  Filled 2022-03-17: qty 50

## 2022-03-17 MED ORDER — SODIUM CHLORIDE 0.9% FLUSH: 10.0000 mL | INTRAVENOUS | Status: DC | PRN

## 2022-03-17 MED ORDER — MORPHINE SULFATE (CONCENTRATE) 20 MG/ML PO SOLN: 20.0000 mg | ORAL | 0 refills | Status: AC | PRN

## 2022-03-17 NOTE — Progress Notes (Signed)
Patient is active with [facility]. Her primary needs are symptom management related and she may be a good candidate for their hospice IPU for ongoing pain control. I do not see any documentation from them in the record in terms of helping to coordinate her discharge plan-will reach out to [facility] to update them an ensure they are aware of this patient's admission and high level needs.  Lane Hacker, DO Palliative Medicine

## 2022-03-17 NOTE — TOC Progression Note (Signed)
Transition of Care [facility]) - Progression Note    Patient Details  Name: Paula Peterson MRN: [id number] Date of Birth: [birth year]  [facility] CM/SW [place], LCSW Phone Number: 03/17/2022, 10:26 AM  Clinical Narrative:    CSW completed PleurX order form for this pt to continue to receive supplies post discharge. Order form has been faxed to [facility] to be signed by physician and then will be faxed to the supply company listed on form. Pt will need to be sent home with a box of 10 canisters. Nurse Secretary has ordered these and have been delivered to pt's room.   CSW contacted [facility] who is active in this pt's care and informed them of recommendation for residential hospice placement. They are to have nurse meet with pt once she returns home to determine if she is a candidate for their residential hospice. [facility] has access to epic but, are unable to document in pt's charts.    Expected Discharge Plan: Home w Hospice Care Barriers to Discharge: No Barriers Identified, Continued Medical Work up  Expected Discharge Plan and Services Expected Discharge Plan: [place] In-house Referral: Hospice / Palliative Care Discharge Planning Services: NA Post Acute Care Choice: Hospice Living arrangements for the past 2 months: Single Family Home                 DME Arranged: N/A DME Agency: NA                   Social Determinants of Health (SDOH) Interventions Housing Interventions: Intervention Not Indicated  Readmission Risk Interventions    03/12/2022    8:58 AM 03/02/2022   12:08 PM 01/30/2022   10:53 AM  Readmission Risk Prevention Plan  Transportation Screening Complete Complete Complete  Medication Review Press photographer) Complete Complete Complete  PCP or Specialist appointment within 3-5 days of discharge Complete Complete Complete  HRI or Home Care Consult Complete Complete  Complete  SW Recovery Care/Counseling Consult Complete Complete Complete  Palliative Care Screening Complete Complete Complete  [place] Not Applicable Not Applicable Not Applicable

## 2022-03-17 NOTE — Progress Notes (Signed)
1315- patient sent home with a box of 10 pleu-x drains

## 2022-03-17 NOTE — Discharge Summary (Addendum)
Physician Discharge Summary  Paula Peterson WIO:[id number] DOB: Dec 18, 1940 DOA: 03/08/2022  PCP: Caryl Bis, MD  Admit date: 03/08/2022  Discharge date: 03/17/2022  Admitted From: Home.  Disposition:  Home with Hospice.  Recommendations for Outpatient Follow-up:  Follow up with PCP in 1-2 weeks. Patient is being discharged home with home hospice following. Continue pain medications as prescribed.  Home Health: Home Hospice Equipment/Devices: Pleurx catheter for comfort  Discharge Condition: Stable CODE STATUS:DNR Diet recommendation:Liquid diet Dysphagia II  Brief Summary/ Hospital Course: This 81 years old female with PMH significant for DVT and PE on Eliquis, hypertension, ovarian cancer with peritoneal carcinomatosis and recurrent admissions for small bowel obstruction related to her malignancy now presented in the ED with abdominal pain,  nausea and vomiting.  Patient describes pain is similar to the prior experiences with SBO but is more severe. CT abdomen and pelvis demonstrates slight worsening of small bowel obstruction.  Patient was admitted for further evaluation. General surgery was consulted. Patient was started on IV hydration, bowel rest, adequate pain control.  Patient is under home hospice care.  Patient also has recurrent pleural effusion.  IR was consulted.  Patient underwent Pleurix catheter for recurrent pleural effusion.  General surgery recommended for venting G-tube for comfort for recurrent small bowel obstruction but considered not an ideal candidate as per IR.  Patient has successful drain through the Pleurx catheter. Palliative care consulted to discuss goals of care.  Family meeting held occurred.  Family is agreeable to home hospice following.  Patient was able to pass flatus, was able to tolerate thin liquids.  Patient is being discharged home with home hospice following.  Patient was prescribed morphine liquid for pain control.  Patient is being discharged  home.  Discharge Diagnoses:  Principal Problem:   SBO (small bowel obstruction) (HCC) Active Problems:   Right ovarian epithelial cancer (HCC)   PE (pulmonary thromboembolism) (HCC)   Recurrent UTI   Thrombocytopenia (HCC)   Recurrent right pleural effusion   Acute cystitis with hematuria  Small bowel obstruction: Patient has had recurrent partial small bowel obstruction due to her malignancy. She presented with recurrent abdominal pain, nausea, vomiting and describes it more severe than previous episodes. Continue IV hydration,  bowel rest, pain control and antiemetics. Patient refuses NG tube. Continue conservative management and bowel rest. General surgery is consulted, potential palliative option for her recurring SBO would be venting gastrostomy tube. She is considered not a candidate for vent G tube as per IR Continue IV hydration.  She is able to pass flatus.  Started on full liquid diet, advanced to Dysphagia III diet. Patient is being discharged home with home hospice following.   UTI: Completed ceftriaxone for 5 days. Urine culture grew E. coli sensitive to ceftriaxone.   Recurrent right pleural effusion: IR is consulted.  Patient underwent Pleurix catheter for symptomatic relief. Repeat chest x-ray shows catheter in place.  No evidence of pneumothorax or pleural effusion. Chest x-ray shows Stable moderate-sized right pleural effusion with adjacent atelectasis and right basilar pleural drainage catheter in place. Family was taught how to take care of Pleurx catheter.   History of DVT and PE Eliquis discontinued since patient has bloody discharge from the Pleurx.   Right ovarian cancer: Multiple prolonged discussion has happened between patient and the oncologist about goals of care.  She is now currently enrolled in home based hospice program.  Her condition is noncurable, her prognosis is poor and with recurrent hospitalization is not possible that  she can continue  home hospice. Palliative care consulted to discuss goals of care.    Discharge Instructions  Discharge Instructions     Call MD for:  persistant dizziness or light-headedness   Complete by: As directed    Call MD for:  persistant nausea and vomiting   Complete by: As directed    Call MD for:  severe uncontrolled pain   Complete by: As directed    Diet - low sodium heart healthy   Complete by: As directed    Diet clear liquid   Complete by: As directed    Discharge instructions   Complete by: As directed    Advised to follow-up with primary care physician in 1 week. Patient is being discharged home with home hospice following. Continue pain medications as prescribed.   Discharge wound care:   Complete by: As directed    Home hospice following.   Increase activity slowly   Complete by: As directed       Allergies as of 03/17/2022   No Known Allergies      Medication List     STOP taking these medications    apixaban 5 MG Tabs tablet Commonly known as: Eliquis   lidocaine-prilocaine cream Commonly known as: EMLA       TAKE these medications    acetaminophen 500 MG tablet Commonly known as: TYLENOL Take 1,000 mg by mouth every 4 (four) hours as needed for headache.   atenolol 25 MG tablet Commonly known as: TENORMIN TAKE ONE (1) TABLET BY MOUTH EVERY DAY What changed: how much to take   bisacodyl 5 MG EC tablet Commonly known as: DULCOLAX Take 1 tablet (5 mg total) by mouth daily as needed for moderate constipation. What changed:  how much to take when to take this   dexamethasone 4 MG tablet Commonly known as: DECADRON Take 1 tablet (4 mg total) by mouth daily.   fentaNYL 25 MCG/HR Commonly known as: [place] 1 patch onto the skin every 3 (three) days.   levothyroxine 75 MCG tablet Commonly known as: SYNTHROID Take 1 tablet (75 mcg total) by mouth daily before breakfast.   LORazepam 0.5 MG tablet Commonly known as: ATIVAN Take 0.5-1  mg by mouth daily as needed for anxiety (for nausea).   morphine 20 MG/ML concentrated solution Commonly known as: ROXANOL Take 1 mL (20 mg total) by mouth every 2 (two) hours as needed for severe pain.   ondansetron 4 MG disintegrating tablet Commonly known as: ZOFRAN-ODT Take 1 tablet (4 mg total) by mouth every 8 (eight) hours as needed for nausea or vomiting. What changed:  how much to take when to take this   oxyCODONE 5 MG immediate release tablet Commonly known as: Oxy IR/ROXICODONE TAKE 1 TABLET BY MOUTH EVERY 4 HOURS AS NEEDED FOR SEVERE PAIN What changed: Another medication with the same name was removed. Continue taking this medication, and follow the directions you see here.   senna-docusate 8.6-50 MG tablet Commonly known as: Senokot-S Take 2 tablets by mouth 2 (two) times daily. What changed:  how much to take when to take this reasons to take this   Voltaren 1 % Gel Generic drug: diclofenac Sodium Apply 1 application. topically daily as needed (arthritis pain).               Discharge Care Instructions  (From admission, onward)           Start     Ordered   03/17/22 0000  Discharge wound care:       Comments: Home hospice following.   03/17/22 1106            Follow-up Information     Caryl Bis, MD Follow up in 1 week(s).   Specialty: Family Medicine Contact information: [place] [address] [phone]                No Known Allergies  Consultations: General surgery, GI, IR   Procedures/Studies:   Subjective: Patient was seen and examined at bedside.  Overnight events noted. Patient feels comfortable with pain control.  She is able to pass flatus, tolerating dysphagia 3 diet. She reports pain is reasonably controlled.  Patient is being discharged with home hospice following tomorrow.  Discharge Exam: Vitals:   03/17/22 0501 03/17/22 1103  BP: 136/77 (!) 146/70  Pulse: 70 84  Resp: 19 15   Temp: (!) 97.5 F (36.4 C) 97.7 F (36.5 C)  SpO2: 95% 94%   Vitals:   03/16/22 2224 03/17/22 0501 03/17/22 0600 03/17/22 1103  BP: (!) 144/72 136/77  (!) 146/70  Pulse: 78 70  84  Resp: '20 19  15  '$ Temp: 97.8 F (36.6 C) (!) 97.5 F (36.4 C)  97.7 F (36.5 C)  TempSrc: Oral Oral  Oral  SpO2: 93% 95%  94%  Weight:   88.2 kg   Height:        General: Pt is alert, awake, not in acute distress Cardiovascular: RRR, S1/S2 +, no rubs, no gallops Respiratory: CTA bilaterally, no wheezing, no rhonchi, Pleurx catheter noted. Abdominal: Soft, mildly tender, nondistended, BS+ Extremities: no edema, no cyanosis    The results of significant diagnostics from this hospitalization (including imaging, microbiology, ancillary and laboratory) are listed below for reference.     Microbiology: Recent Results (from the past 240 hour(s))  Urine Culture     Status: Abnormal   Collection Time: 03/09/22  2:50 AM   Specimen: Urine, Clean Catch  Result Value Ref Range Status   Specimen Description   Final    URINE, CLEAN CATCH Performed at [facility], [place] [address], [place], [place] [address]    Special Requests   Final    NONE Performed at [facility], [place] [address], [place], [place] [address]    Culture >=100,000 COLONIES/mL ESCHERICHIA COLI (A)  Final   Report Status 03/11/2022 FINAL  Final   Organism ID, Bacteria ESCHERICHIA COLI (A)  Final      Susceptibility   Escherichia coli - MIC*    AMPICILLIN 4 SENSITIVE Sensitive     CEFAZOLIN <=4 SENSITIVE Sensitive     CEFEPIME <=0.12 SENSITIVE Sensitive     CEFTRIAXONE <=0.25 SENSITIVE Sensitive     CIPROFLOXACIN <=0.25 SENSITIVE Sensitive     GENTAMICIN <=1 SENSITIVE Sensitive     IMIPENEM <=0.25 SENSITIVE Sensitive     NITROFURANTOIN <=16 SENSITIVE Sensitive     TRIMETH/SULFA <=20 SENSITIVE Sensitive     AMPICILLIN/SULBACTAM <=2 SENSITIVE Sensitive     PIP/TAZO <=4 SENSITIVE Sensitive      * >=100,000 COLONIES/mL ESCHERICHIA COLI     Labs: BNP (last 3 results) Recent Labs    01/28/22 1404  BNP 67.1   Basic Metabolic Panel: Recent Labs  Lab 03/11/22 0209 03/12/22 0500 03/13/22 0416 03/14/22 0500 03/17/22 0510  NA 137 135 132* 132* 136  K 3.2* 3.3* 3.9 4.0 4.4  CL 102 101 98 97* 100  CO2 27  $'25 27 28 26  'p$ GLUCOSE 104* 104* 148* 144* 115*  BUN '17 13 18 21 20  '$ CREATININE 0.88 0.86 0.84 0.95 0.97  CALCIUM 8.8* 8.7* 9.0 9.2 9.5  MG 1.7 1.5* 2.0  --  1.6*  PHOS  --  2.0* 3.4  --  3.3   Liver Function Tests: No results for input(s): "AST", "ALT", "ALKPHOS", "BILITOT", "PROT", "ALBUMIN" in the last 168 hours. No results for input(s): "LIPASE", "AMYLASE" in the last 168 hours. No results for input(s): "AMMONIA" in the last 168 hours. CBC: Recent Labs  Lab 03/11/22 0209 03/12/22 0500 03/13/22 0416 03/14/22 0500 03/17/22 0510  WBC 3.8* 3.8* 3.6* 5.1 4.6  HGB 10.3* 10.7* 11.2* 11.4* 11.8*  HCT 32.2* 33.2* 33.7* 33.9* 37.2  MCV 106.6* 104.1* 101.2* 101.2* 105.7*  PLT 103* 108* 114* 145* 155   Cardiac Enzymes: No results for input(s): "CKTOTAL", "CKMB", "CKMBINDEX", "TROPONINI" in the last 168 hours. BNP: Invalid input(s): "POCBNP" CBG: No results for input(s): "GLUCAP" in the last 168 hours. D-Dimer No results for input(s): "DDIMER" in the last 72 hours. Hgb A1c No results for input(s): "HGBA1C" in the last 72 hours. Lipid Profile No results for input(s): "CHOL", "HDL", "LDLCALC", "TRIG", "CHOLHDL", "LDLDIRECT" in the last 72 hours. Thyroid function studies No results for input(s): "TSH", "T4TOTAL", "T3FREE", "THYROIDAB" in the last 72 hours.  Invalid input(s): "FREET3" Anemia work up No results for input(s): "VITAMINB12", "FOLATE", "FERRITIN", "TIBC", "IRON", "RETICCTPCT" in the last 72 hours. Urinalysis    Component Value Date/Time   COLORURINE YELLOW 03/08/2022 2326   APPEARANCEUR CLEAR 03/08/2022 2326   LABSPEC 1.025 03/08/2022 2326   PHURINE  7.0 03/08/2022 2326   GLUCOSEU NEGATIVE 03/08/2022 2326   HGBUR LARGE (A) 03/08/2022 2326   BILIRUBINUR NEGATIVE 03/08/2022 2326   KETONESUR NEGATIVE 03/08/2022 2326   PROTEINUR 100 (A) 03/08/2022 2326   UROBILINOGEN 0.2 04/08/2013 2040   NITRITE POSITIVE (A) 03/08/2022 2326   LEUKOCYTESUR MODERATE (A) 03/08/2022 2326   Sepsis Labs Recent Labs  Lab 03/12/22 0500 03/13/22 0416 03/14/22 0500 03/17/22 0510  WBC 3.8* 3.6* 5.1 4.6   Microbiology Recent Results (from the past 240 hour(s))  Urine Culture     Status: Abnormal   Collection Time: 03/09/22  2:50 AM   Specimen: Urine, Clean Catch  Result Value Ref Range Status   Specimen Description   Final    URINE, CLEAN CATCH Performed at [facility], [place] [address] [place], [place] [address]    Special Requests   Final    NONE Performed at [facility], [place] [address], [place], [place] [address]    Culture >=100,000 COLONIES/mL ESCHERICHIA COLI (A)  Final   Report Status 03/11/2022 FINAL  Final   Organism ID, Bacteria ESCHERICHIA COLI (A)  Final      Susceptibility   Escherichia coli - MIC*    AMPICILLIN 4 SENSITIVE Sensitive     CEFAZOLIN <=4 SENSITIVE Sensitive     CEFEPIME <=0.12 SENSITIVE Sensitive     CEFTRIAXONE <=0.25 SENSITIVE Sensitive     CIPROFLOXACIN <=0.25 SENSITIVE Sensitive     GENTAMICIN <=1 SENSITIVE Sensitive     IMIPENEM <=0.25 SENSITIVE Sensitive     NITROFURANTOIN <=16 SENSITIVE Sensitive     TRIMETH/SULFA <=20 SENSITIVE Sensitive     AMPICILLIN/SULBACTAM <=2 SENSITIVE Sensitive     PIP/TAZO <=4 SENSITIVE Sensitive     * >=100,000 COLONIES/mL ESCHERICHIA COLI     Time coordinating discharge: Over 30 minutes  SIGNED:  Shawna Clamp, MD  [facility] 03/17/2022, 2:47 PM Pager   If 7PM-7AM, please contact night-coverage

## 2022-03-17 NOTE — Discharge Instructions (Signed)
Advised to follow-up with primary care physician in 1 week. Patient is being discharged home with home hospice following. Continue pain medications as prescribed.

## 2022-04-07 ENCOUNTER — Encounter: Payer: Self-pay | Admitting: Hematology and Oncology

## 2022-04-20 DIAGNOSIS — E039 Hypothyroidism, unspecified: Secondary | ICD-10-CM | POA: Diagnosis not present

## 2022-04-20 DIAGNOSIS — N1832 Chronic kidney disease, stage 3b: Secondary | ICD-10-CM | POA: Diagnosis not present

## 2022-04-29 DEATH — deceased

## 2022-08-16 IMAGING — CT CT ABD-PELV W/ CM
2 of 5 series · 16 of 46 positions shown, 18 images · IV contrast (APPLIED)
Comparison: 09/20/2019

CLINICAL DATA: Restaging ovarian cancer. Follow-up peritoneal
nodule.

EXAM:
CT ABDOMEN AND PELVIS WITH CONTRAST
TECHNIQUE: Multidetector CT imaging of the abdomen and pelvis was performed
using the standard protocol following bolus administration of
intravenous contrast.
CONTRAST:  100mL OMNIPAQUE IOHEXOL 300 MG/ML  SOLN

[Series 2: axial st · axial · 0.71mm/px · z∈[-549,-124]mm · 13 of 99 slices shown, 15 images]
[im 7/99  soft-tissue]
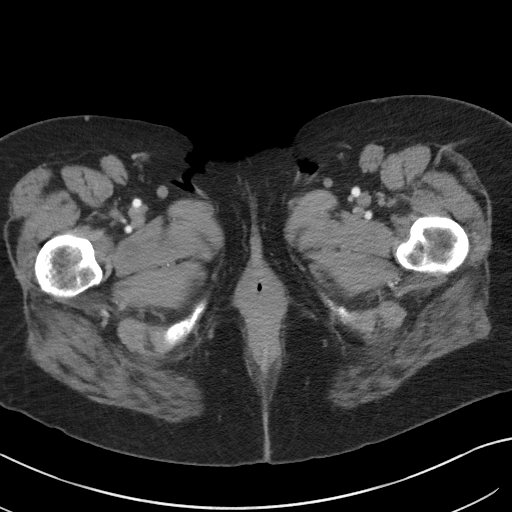
[im 7/99  bone]
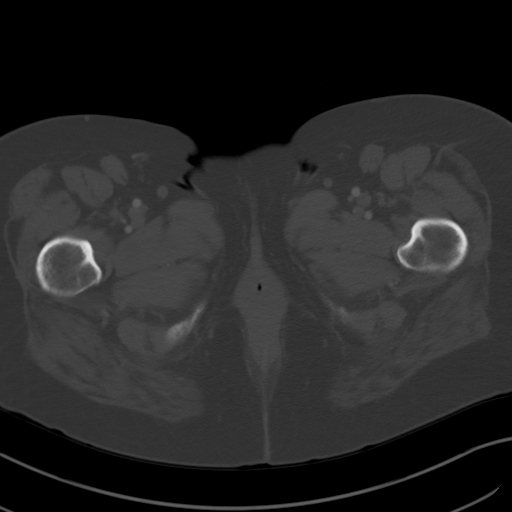
[im 14/99  soft-tissue]
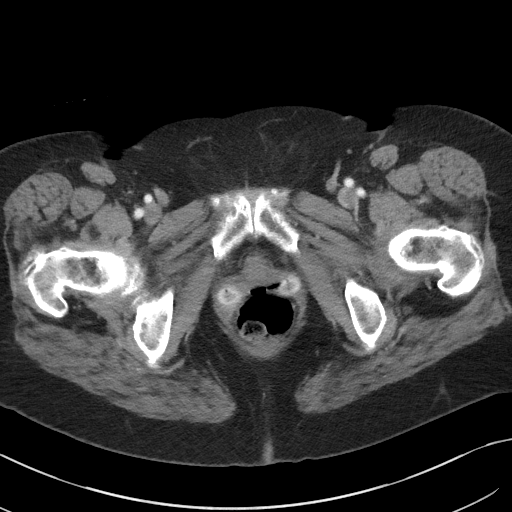
[im 20/99  soft-tissue]
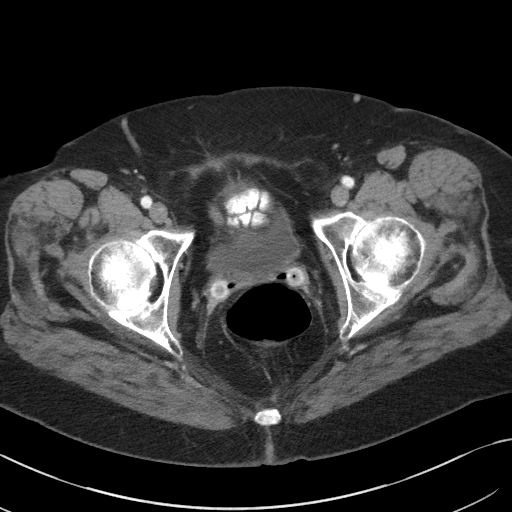
[im 27/99  soft-tissue]
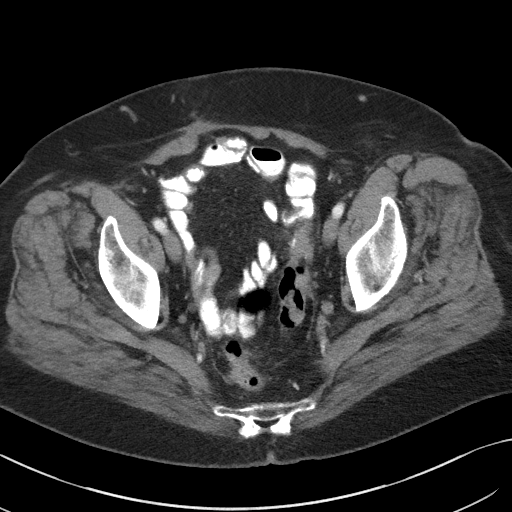
[im 33/99  soft-tissue]
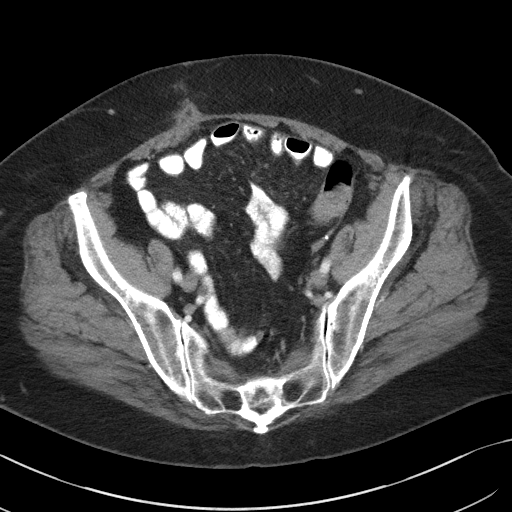
[im 40/99  soft-tissue]
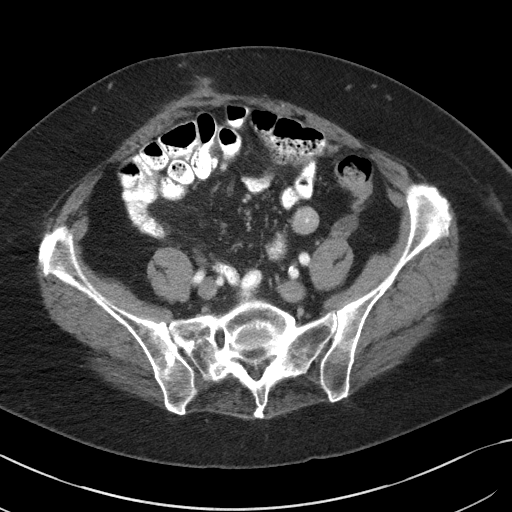
[im 53/99  soft-tissue]
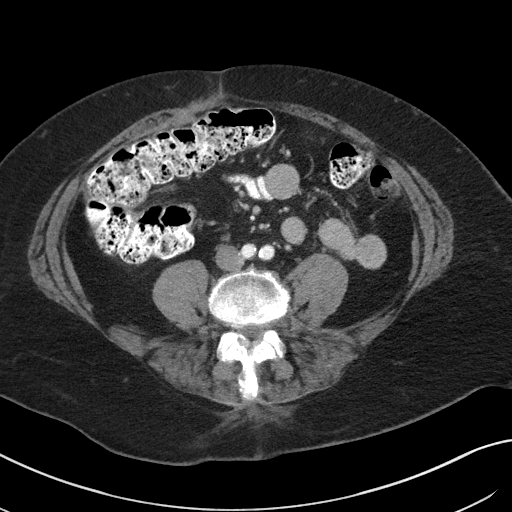
[im 59/99  soft-tissue]
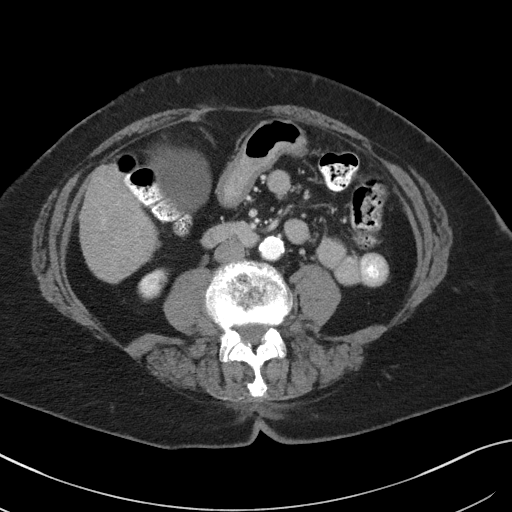
[im 66/99  soft-tissue]
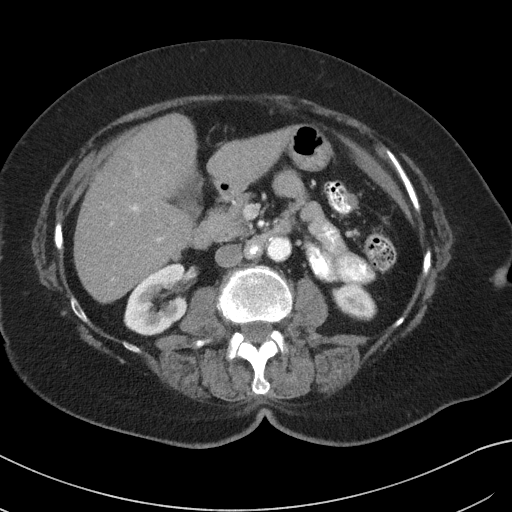
[im 66/99  bone]
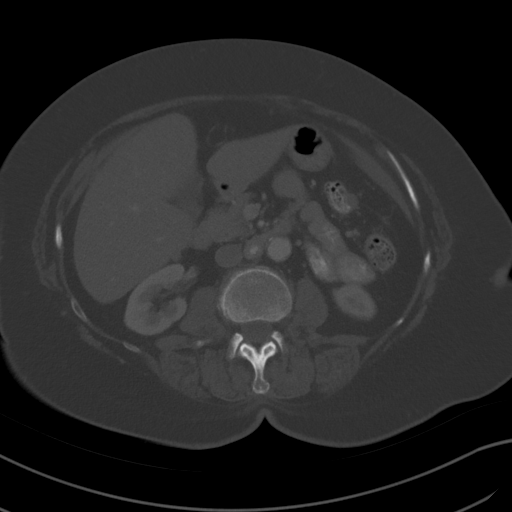
[im 72/99  soft-tissue]
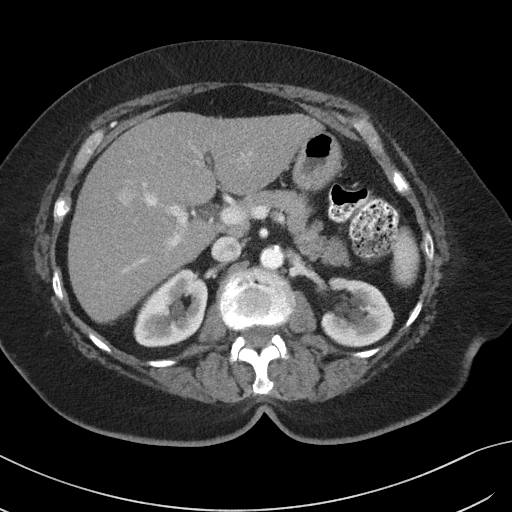
[im 79/99  soft-tissue]
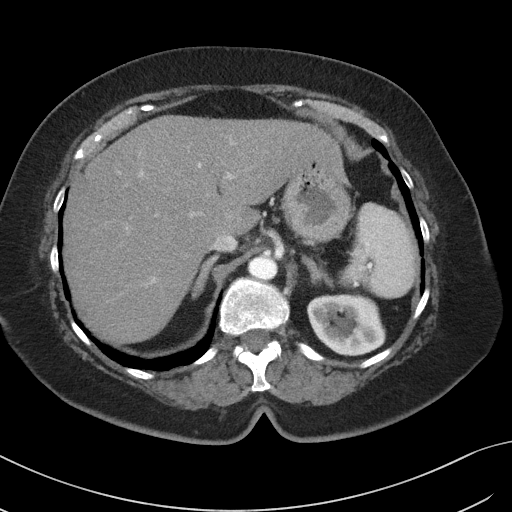
[im 85/99  soft-tissue]
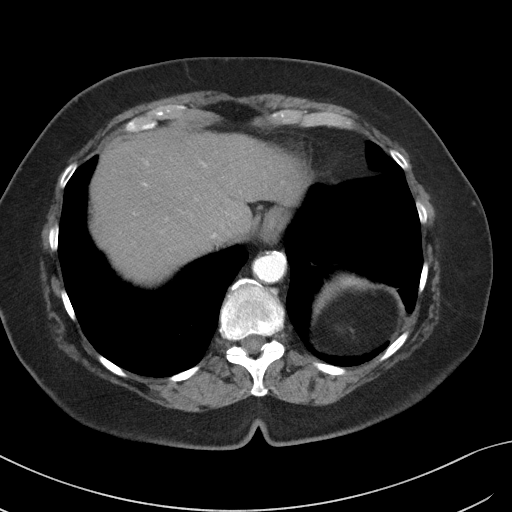
[im 92/99  soft-tissue]
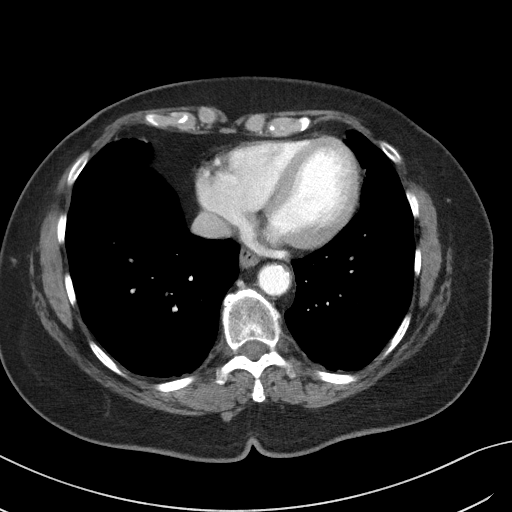

[Series 4: coronal st · coronal · 0.82mm/px · 3 of 97 slices shown]
[im 33/97  soft-tissue]
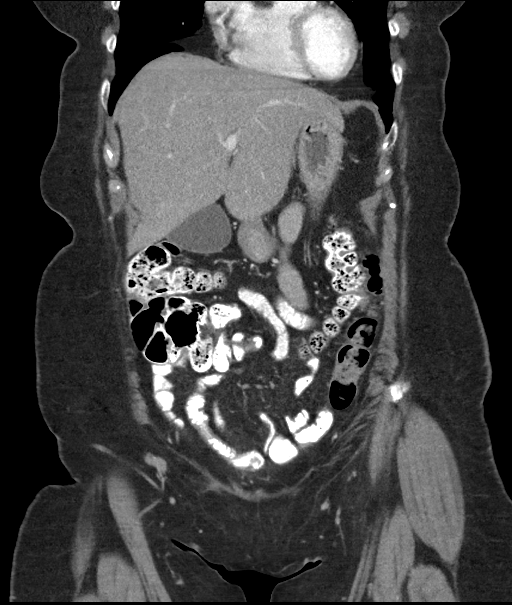
[im 43/97  soft-tissue]
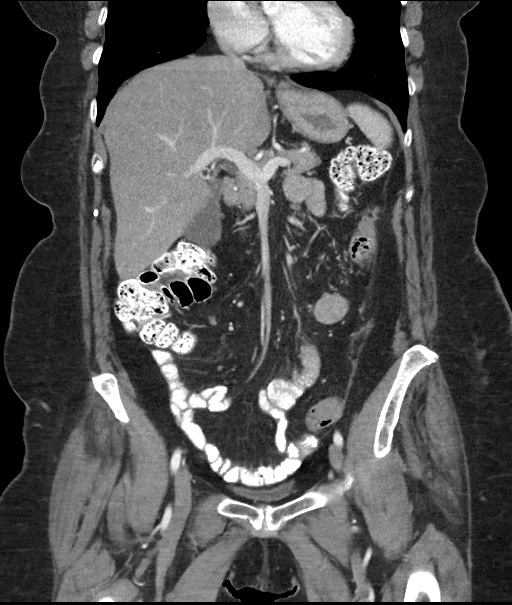
[im 54/97  soft-tissue]
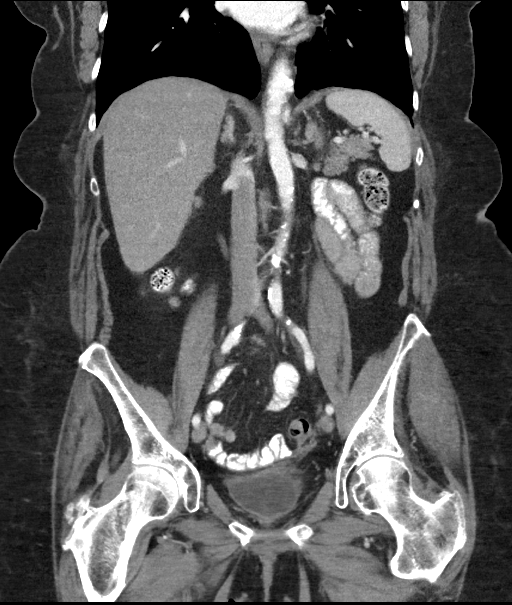

[16 of 46 positions shown; findings below may reference images not displayed]

FINDINGS: Lower chest: No acute abnormality.

Hepatobiliary: No focal liver abnormality identified. No gallstones
or signs of gallbladder wall thickening or inflammation. No biliary
ductal dilatation.

Pancreas: Unremarkable. No pancreatic ductal dilatation or
surrounding inflammatory changes.

Spleen: Normal in size without focal abnormality.

Adrenals/Urinary Tract: Normal adrenal glands. No kidney mass or
hydronephrosis identified. Urinary bladder is unremarkable.

Stomach/Bowel: The stomach is nondistended. There is no small bowel
wall thickening, inflammation or distension. No pathologic
dilatation of the colon. Asymmetric wall thickening is noted along
the anterior wall of the proximal transverse colon which appears
progressive from previous exam, image 53/2 and is suspicious for
serosal disease. There is a new serosal implant along the distal
transverse colon measuring 1.3 cm in thickness, new from previous
exam. Lastly, there is progressive serosal disease involving the
descending colon, image 41/4 and image 39/4. This is new from
previous exam. No pathologic dilatation of the bowel loops.

Vascular/Lymphatic: Aortic atherosclerosis. No aneurysm. No
retroperitoneal or mesenteric adenopathy. No iliac adenopathy. Left
inguinal lymph node is enlarged measuring 1.6 cm, new from previous
exam, image 76/2. Right inguinal lymph node is enlarged measuring
1.3 cm, image 76/2. Also new.

Reproductive: Status post hysterectomy.

Other: No ascites. Previous gastrocolic ligament nodule measures 8
mm, image 45/2. Previously 6 mm. Soft tissue nodule along the
undersurface of the ventral abdominal wall measures 0.9 cm, image
48/2. New from previous exam.

Musculoskeletal: No acute or significant osseous findings.
Degenerative disc disease noted within the lumbar spine. No acute or
suspicious osseous findings.
IMPRESSION: 1. Interval progression of peritoneal disease which predominantly
involves the serosal surface of the proximal and distal transverse
colon, and descending colon.
2. New bilateral inguinal adenopathy.
3. No ascites.
4. Aortic atherosclerosis.

Aortic Atherosclerosis ([id number]-RL4.4).
